# Patient Record
Sex: Male | Born: 1940 | Race: Black or African American | Hispanic: No | Smoking: Former smoker
Health system: Southern US, Community
[De-identification: ages and names within clinical notes are randomized; demographics above are authoritative.]

## PROBLEM LIST (undated history)

## (undated) DIAGNOSIS — M1990 Unspecified osteoarthritis, unspecified site: Secondary | ICD-10-CM

## (undated) DIAGNOSIS — H9319 Tinnitus, unspecified ear: Secondary | ICD-10-CM

## (undated) DIAGNOSIS — K279 Peptic ulcer, site unspecified, unspecified as acute or chronic, without hemorrhage or perforation: Secondary | ICD-10-CM

## (undated) DIAGNOSIS — M7730 Calcaneal spur, unspecified foot: Secondary | ICD-10-CM

## (undated) DIAGNOSIS — T4145XA Adverse effect of unspecified anesthetic, initial encounter: Secondary | ICD-10-CM

## (undated) DIAGNOSIS — K219 Gastro-esophageal reflux disease without esophagitis: Secondary | ICD-10-CM

## (undated) DIAGNOSIS — Z8042 Family history of malignant neoplasm of prostate: Secondary | ICD-10-CM

## (undated) DIAGNOSIS — M545 Low back pain, unspecified: Secondary | ICD-10-CM

## (undated) DIAGNOSIS — Z87440 Personal history of urinary (tract) infections: Secondary | ICD-10-CM

## (undated) DIAGNOSIS — N200 Calculus of kidney: Secondary | ICD-10-CM

## (undated) DIAGNOSIS — Z860100 Personal history of colon polyps, unspecified: Secondary | ICD-10-CM

## (undated) DIAGNOSIS — E669 Obesity, unspecified: Secondary | ICD-10-CM

## (undated) DIAGNOSIS — Z9889 Other specified postprocedural states: Secondary | ICD-10-CM

## (undated) DIAGNOSIS — D696 Thrombocytopenia, unspecified: Principal | ICD-10-CM

## (undated) DIAGNOSIS — E611 Iron deficiency: Principal | ICD-10-CM

## (undated) DIAGNOSIS — L03115 Cellulitis of right lower limb: Secondary | ICD-10-CM

## (undated) DIAGNOSIS — M359 Systemic involvement of connective tissue, unspecified: Secondary | ICD-10-CM

## (undated) DIAGNOSIS — G4730 Sleep apnea, unspecified: Secondary | ICD-10-CM

## (undated) DIAGNOSIS — R112 Nausea with vomiting, unspecified: Secondary | ICD-10-CM

## (undated) DIAGNOSIS — H409 Unspecified glaucoma: Secondary | ICD-10-CM

## (undated) DIAGNOSIS — Z800 Family history of malignant neoplasm of digestive organs: Secondary | ICD-10-CM

## (undated) DIAGNOSIS — G8929 Other chronic pain: Secondary | ICD-10-CM

## (undated) DIAGNOSIS — N400 Enlarged prostate without lower urinary tract symptoms: Secondary | ICD-10-CM

## (undated) DIAGNOSIS — R51 Headache: Secondary | ICD-10-CM

## (undated) DIAGNOSIS — R519 Headache, unspecified: Secondary | ICD-10-CM

## (undated) DIAGNOSIS — Z741 Need for assistance with personal care: Secondary | ICD-10-CM

## (undated) DIAGNOSIS — T8859XA Other complications of anesthesia, initial encounter: Secondary | ICD-10-CM

## (undated) DIAGNOSIS — L03116 Cellulitis of left lower limb: Secondary | ICD-10-CM

## (undated) DIAGNOSIS — E039 Hypothyroidism, unspecified: Secondary | ICD-10-CM

## (undated) DIAGNOSIS — I5032 Chronic diastolic (congestive) heart failure: Secondary | ICD-10-CM

## (undated) DIAGNOSIS — I872 Venous insufficiency (chronic) (peripheral): Secondary | ICD-10-CM

## (undated) DIAGNOSIS — E785 Hyperlipidemia, unspecified: Secondary | ICD-10-CM

## (undated) DIAGNOSIS — C259 Malignant neoplasm of pancreas, unspecified: Secondary | ICD-10-CM

## (undated) DIAGNOSIS — H269 Unspecified cataract: Secondary | ICD-10-CM

## (undated) DIAGNOSIS — I10 Essential (primary) hypertension: Secondary | ICD-10-CM

## (undated) DIAGNOSIS — R6510 Systemic inflammatory response syndrome (SIRS) of non-infectious origin without acute organ dysfunction: Secondary | ICD-10-CM

## (undated) HISTORY — DX: Gastro-esophageal reflux disease without esophagitis: K21.9

## (undated) HISTORY — PX: BACK SURGERY: SHX140

## (undated) HISTORY — DX: Hypothyroidism, unspecified: E03.9

## (undated) HISTORY — PX: KNEE ARTHROSCOPY: SUR90

## (undated) HISTORY — DX: Other specified postprocedural states: Z98.890

## (undated) HISTORY — DX: Peptic ulcer, site unspecified, unspecified as acute or chronic, without hemorrhage or perforation: K27.9

## (undated) HISTORY — DX: Obesity, unspecified: E66.9

## (undated) HISTORY — DX: Unspecified osteoarthritis, unspecified site: M19.90

## (undated) HISTORY — DX: Family history of malignant neoplasm of digestive organs: Z80.0

## (undated) HISTORY — DX: Thrombocytopenia, unspecified: D69.6

## (undated) HISTORY — DX: Family history of malignant neoplasm of prostate: Z80.42

## (undated) HISTORY — DX: Tinnitus, unspecified ear: H93.19

## (undated) HISTORY — DX: Hyperlipidemia, unspecified: E78.5

## (undated) HISTORY — DX: Personal history of urinary (tract) infections: Z87.440

## (undated) HISTORY — PX: TRANSURETHRAL RESECTION OF PROSTATE: SHX73

## (undated) HISTORY — DX: Malignant neoplasm of pancreas, unspecified: C25.9

## (undated) HISTORY — DX: Calcaneal spur, unspecified foot: M77.30

## (undated) HISTORY — PX: CATARACT EXTRACTION W/ INTRAOCULAR LENS IMPLANT: SHX1309

## (undated) HISTORY — PX: SHOULDER OPEN ROTATOR CUFF REPAIR: SHX2407

## (undated) HISTORY — DX: Venous insufficiency (chronic) (peripheral): I87.2

## (undated) HISTORY — DX: Iron deficiency: E61.1

## (undated) HISTORY — PX: LAPAROSCOPIC CHOLECYSTECTOMY: SUR755

---
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
## 1998-01-16 ENCOUNTER — Ambulatory Visit (HOSPITAL_COMMUNITY): Admission: RE | Admit: 1998-01-16 | Discharge: 1998-01-16 | Payer: Self-pay | Admitting: Orthopaedic Surgery

## 2001-02-05 ENCOUNTER — Ambulatory Visit (HOSPITAL_COMMUNITY): Admission: RE | Admit: 2001-02-05 | Discharge: 2001-02-05 | Payer: Self-pay | Admitting: Internal Medicine

## 2001-02-05 ENCOUNTER — Encounter: Payer: Self-pay | Admitting: Internal Medicine

## 2001-03-09 ENCOUNTER — Ambulatory Visit (HOSPITAL_COMMUNITY): Admission: RE | Admit: 2001-03-09 | Discharge: 2001-03-09 | Payer: Self-pay | Admitting: Internal Medicine

## 2001-06-07 ENCOUNTER — Ambulatory Visit (HOSPITAL_COMMUNITY): Admission: RE | Admit: 2001-06-07 | Discharge: 2001-06-07 | Payer: Self-pay | Admitting: Internal Medicine

## 2001-06-07 ENCOUNTER — Encounter: Payer: Self-pay | Admitting: Internal Medicine

## 2001-07-30 ENCOUNTER — Encounter: Payer: Self-pay | Admitting: Neurosurgery

## 2001-08-01 ENCOUNTER — Observation Stay (HOSPITAL_COMMUNITY): Admission: RE | Admit: 2001-08-01 | Discharge: 2001-08-02 | Payer: Self-pay | Admitting: Neurosurgery

## 2001-08-01 ENCOUNTER — Encounter: Payer: Self-pay | Admitting: Neurosurgery

## 2001-08-02 ENCOUNTER — Encounter: Payer: Self-pay | Admitting: Neurosurgery

## 2001-11-11 ENCOUNTER — Ambulatory Visit (HOSPITAL_COMMUNITY): Admission: RE | Admit: 2001-11-11 | Discharge: 2001-11-11 | Payer: Self-pay | Admitting: Neurosurgery

## 2001-11-11 ENCOUNTER — Encounter: Payer: Self-pay | Admitting: Neurosurgery

## 2001-12-04 ENCOUNTER — Encounter: Admission: RE | Admit: 2001-12-04 | Discharge: 2001-12-04 | Payer: Self-pay | Admitting: Neurosurgery

## 2001-12-04 ENCOUNTER — Encounter: Payer: Self-pay | Admitting: Neurosurgery

## 2001-12-18 ENCOUNTER — Encounter: Payer: Self-pay | Admitting: Neurosurgery

## 2001-12-18 ENCOUNTER — Encounter: Admission: RE | Admit: 2001-12-18 | Discharge: 2001-12-18 | Payer: Self-pay | Admitting: Neurosurgery

## 2002-01-01 ENCOUNTER — Encounter: Admission: RE | Admit: 2002-01-01 | Discharge: 2002-01-01 | Payer: Self-pay | Admitting: Neurosurgery

## 2002-01-01 ENCOUNTER — Encounter: Payer: Self-pay | Admitting: Neurosurgery

## 2004-07-29 ENCOUNTER — Encounter: Admission: RE | Admit: 2004-07-29 | Discharge: 2004-07-29 | Payer: Self-pay | Admitting: Neurosurgery

## 2005-07-21 ENCOUNTER — Ambulatory Visit (HOSPITAL_COMMUNITY): Admission: RE | Admit: 2005-07-21 | Discharge: 2005-07-21 | Payer: Self-pay

## 2006-05-09 ENCOUNTER — Ambulatory Visit: Payer: Self-pay | Admitting: Internal Medicine

## 2006-05-22 ENCOUNTER — Ambulatory Visit: Payer: Self-pay | Admitting: Internal Medicine

## 2006-05-22 ENCOUNTER — Ambulatory Visit (HOSPITAL_COMMUNITY): Admission: RE | Admit: 2006-05-22 | Discharge: 2006-05-22 | Payer: Self-pay | Admitting: Internal Medicine

## 2006-05-22 ENCOUNTER — Encounter (INDEPENDENT_AMBULATORY_CARE_PROVIDER_SITE_OTHER): Payer: Self-pay | Admitting: Specialist

## 2006-10-20 ENCOUNTER — Ambulatory Visit (HOSPITAL_COMMUNITY): Admission: RE | Admit: 2006-10-20 | Discharge: 2006-10-20 | Payer: Self-pay | Admitting: Neurosurgery

## 2006-12-22 HISTORY — PX: NM MYOCAR PERF WALL MOTION: HXRAD629

## 2007-02-16 ENCOUNTER — Ambulatory Visit: Payer: Self-pay | Admitting: Gastroenterology

## 2007-02-20 ENCOUNTER — Ambulatory Visit (HOSPITAL_COMMUNITY): Admission: RE | Admit: 2007-02-20 | Discharge: 2007-02-20 | Payer: Self-pay | Admitting: Gastroenterology

## 2007-02-21 ENCOUNTER — Ambulatory Visit (HOSPITAL_COMMUNITY): Admission: RE | Admit: 2007-02-21 | Discharge: 2007-02-21 | Payer: Self-pay | Admitting: *Deleted

## 2007-02-27 HISTORY — PX: CARDIAC CATHETERIZATION: SHX172

## 2007-02-28 ENCOUNTER — Encounter (HOSPITAL_COMMUNITY): Admission: RE | Admit: 2007-02-28 | Discharge: 2007-03-30 | Payer: Self-pay | Admitting: Internal Medicine

## 2007-05-25 ENCOUNTER — Ambulatory Visit: Payer: Self-pay | Admitting: Internal Medicine

## 2007-05-29 ENCOUNTER — Ambulatory Visit (HOSPITAL_COMMUNITY): Admission: RE | Admit: 2007-05-29 | Discharge: 2007-05-29 | Payer: Self-pay | Admitting: Internal Medicine

## 2007-06-06 ENCOUNTER — Encounter: Admission: RE | Admit: 2007-06-06 | Discharge: 2007-06-06 | Payer: Self-pay | Admitting: Internal Medicine

## 2007-06-12 ENCOUNTER — Ambulatory Visit: Payer: Self-pay | Admitting: Internal Medicine

## 2007-07-16 ENCOUNTER — Encounter (INDEPENDENT_AMBULATORY_CARE_PROVIDER_SITE_OTHER): Payer: Self-pay | Admitting: General Surgery

## 2007-07-16 ENCOUNTER — Ambulatory Visit (HOSPITAL_COMMUNITY): Admission: RE | Admit: 2007-07-16 | Discharge: 2007-07-16 | Payer: Self-pay | Admitting: General Surgery

## 2007-09-12 ENCOUNTER — Ambulatory Visit: Payer: Self-pay | Admitting: Internal Medicine

## 2007-10-26 ENCOUNTER — Ambulatory Visit: Payer: Self-pay | Admitting: Internal Medicine

## 2007-11-04 ENCOUNTER — Emergency Department (HOSPITAL_COMMUNITY): Admission: EM | Admit: 2007-11-04 | Discharge: 2007-11-04 | Payer: Self-pay | Admitting: Emergency Medicine

## 2007-11-08 ENCOUNTER — Ambulatory Visit: Payer: Self-pay | Admitting: Internal Medicine

## 2007-11-19 ENCOUNTER — Ambulatory Visit: Payer: Self-pay | Admitting: Gastroenterology

## 2007-11-20 ENCOUNTER — Ambulatory Visit (HOSPITAL_COMMUNITY): Admission: RE | Admit: 2007-11-20 | Discharge: 2007-11-20 | Payer: Self-pay | Admitting: Gastroenterology

## 2008-01-30 ENCOUNTER — Encounter: Admission: RE | Admit: 2008-01-30 | Discharge: 2008-01-30 | Payer: Self-pay | Admitting: Neurosurgery

## 2008-06-05 ENCOUNTER — Ambulatory Visit: Payer: Self-pay | Admitting: Internal Medicine

## 2008-07-07 ENCOUNTER — Ambulatory Visit (HOSPITAL_COMMUNITY): Admission: RE | Admit: 2008-07-07 | Discharge: 2008-07-07 | Payer: Self-pay | Admitting: Internal Medicine

## 2008-09-20 HISTORY — PX: TRANSTHORACIC ECHOCARDIOGRAM: SHX275

## 2008-10-01 ENCOUNTER — Inpatient Hospital Stay (HOSPITAL_COMMUNITY): Admission: EM | Admit: 2008-10-01 | Discharge: 2008-10-03 | Payer: Self-pay | Admitting: Emergency Medicine

## 2008-10-02 ENCOUNTER — Encounter (INDEPENDENT_AMBULATORY_CARE_PROVIDER_SITE_OTHER): Payer: Self-pay | Admitting: Family Medicine

## 2008-10-08 ENCOUNTER — Emergency Department (HOSPITAL_COMMUNITY): Admission: EM | Admit: 2008-10-08 | Discharge: 2008-10-08 | Payer: Self-pay | Admitting: Emergency Medicine

## 2008-10-13 ENCOUNTER — Ambulatory Visit (HOSPITAL_COMMUNITY): Admission: RE | Admit: 2008-10-13 | Discharge: 2008-10-13 | Payer: Self-pay | Admitting: Internal Medicine

## 2009-05-07 ENCOUNTER — Ambulatory Visit (HOSPITAL_COMMUNITY): Admission: RE | Admit: 2009-05-07 | Discharge: 2009-05-07 | Payer: Self-pay | Admitting: Cardiology

## 2009-05-11 ENCOUNTER — Ambulatory Visit (HOSPITAL_COMMUNITY): Admission: RE | Admit: 2009-05-11 | Discharge: 2009-05-11 | Payer: Self-pay | Admitting: Cardiology

## 2009-06-19 DIAGNOSIS — I10 Essential (primary) hypertension: Secondary | ICD-10-CM | POA: Insufficient documentation

## 2009-06-19 DIAGNOSIS — R143 Flatulence: Secondary | ICD-10-CM

## 2009-06-19 DIAGNOSIS — N259 Disorder resulting from impaired renal tubular function, unspecified: Secondary | ICD-10-CM | POA: Insufficient documentation

## 2009-06-19 DIAGNOSIS — R142 Eructation: Secondary | ICD-10-CM

## 2009-06-19 DIAGNOSIS — R141 Gas pain: Secondary | ICD-10-CM | POA: Insufficient documentation

## 2009-06-19 DIAGNOSIS — K219 Gastro-esophageal reflux disease without esophagitis: Secondary | ICD-10-CM | POA: Insufficient documentation

## 2009-06-19 DIAGNOSIS — K7689 Other specified diseases of liver: Secondary | ICD-10-CM | POA: Insufficient documentation

## 2009-06-19 DIAGNOSIS — K8020 Calculus of gallbladder without cholecystitis without obstruction: Secondary | ICD-10-CM | POA: Insufficient documentation

## 2009-06-19 DIAGNOSIS — R109 Unspecified abdominal pain: Secondary | ICD-10-CM | POA: Insufficient documentation

## 2009-07-16 ENCOUNTER — Ambulatory Visit: Payer: Self-pay | Admitting: Internal Medicine

## 2009-07-16 DIAGNOSIS — Z860100 Personal history of colon polyps, unspecified: Secondary | ICD-10-CM | POA: Insufficient documentation

## 2009-09-09 ENCOUNTER — Encounter (INDEPENDENT_AMBULATORY_CARE_PROVIDER_SITE_OTHER): Payer: Self-pay

## 2009-10-20 ENCOUNTER — Ambulatory Visit: Payer: Self-pay | Admitting: Internal Medicine

## 2010-01-05 ENCOUNTER — Encounter: Payer: Self-pay | Admitting: Internal Medicine

## 2010-01-06 ENCOUNTER — Inpatient Hospital Stay (HOSPITAL_COMMUNITY)
Admission: EM | Admit: 2010-01-06 | Discharge: 2010-01-12 | Payer: Self-pay | Source: Home / Self Care | Admitting: Emergency Medicine

## 2010-02-15 ENCOUNTER — Encounter (INDEPENDENT_AMBULATORY_CARE_PROVIDER_SITE_OTHER): Payer: Self-pay | Admitting: *Deleted

## 2010-02-16 ENCOUNTER — Telehealth (INDEPENDENT_AMBULATORY_CARE_PROVIDER_SITE_OTHER): Payer: Self-pay

## 2010-02-26 ENCOUNTER — Ambulatory Visit (HOSPITAL_COMMUNITY)
Admission: RE | Admit: 2010-02-26 | Discharge: 2010-02-26 | Payer: Self-pay | Source: Home / Self Care | Admitting: Internal Medicine

## 2010-05-18 ENCOUNTER — Encounter (INDEPENDENT_AMBULATORY_CARE_PROVIDER_SITE_OTHER): Payer: Self-pay

## 2010-05-19 ENCOUNTER — Ambulatory Visit (HOSPITAL_COMMUNITY)
Admission: RE | Admit: 2010-05-19 | Discharge: 2010-05-19 | Payer: Self-pay | Source: Home / Self Care | Attending: Internal Medicine | Admitting: Internal Medicine

## 2010-05-23 HISTORY — PX: LUMBAR DISC SURGERY: SHX700

## 2010-06-08 ENCOUNTER — Encounter: Payer: Self-pay | Admitting: Internal Medicine

## 2010-06-08 ENCOUNTER — Ambulatory Visit
Admission: RE | Admit: 2010-06-08 | Discharge: 2010-06-08 | Payer: Self-pay | Source: Home / Self Care | Attending: Gastroenterology | Admitting: Gastroenterology

## 2010-06-08 DIAGNOSIS — R197 Diarrhea, unspecified: Secondary | ICD-10-CM | POA: Insufficient documentation

## 2010-06-08 DIAGNOSIS — R110 Nausea: Secondary | ICD-10-CM | POA: Insufficient documentation

## 2010-06-13 ENCOUNTER — Encounter: Payer: Self-pay | Admitting: Internal Medicine

## 2010-06-13 ENCOUNTER — Encounter: Payer: Self-pay | Admitting: Neurosurgery

## 2010-06-14 ENCOUNTER — Encounter: Payer: Self-pay | Admitting: Gastroenterology

## 2010-06-23 ENCOUNTER — Other Ambulatory Visit: Payer: Self-pay | Admitting: Internal Medicine

## 2010-06-23 ENCOUNTER — Encounter: Payer: Medicare Other | Admitting: Internal Medicine

## 2010-06-23 ENCOUNTER — Ambulatory Visit (HOSPITAL_COMMUNITY)
Admission: RE | Admit: 2010-06-23 | Discharge: 2010-06-23 | Disposition: A | Discharge status: Home / Self Care | Payer: Medicare Other | Attending: Internal Medicine | Admitting: Internal Medicine

## 2010-06-23 DIAGNOSIS — D126 Benign neoplasm of colon, unspecified: Secondary | ICD-10-CM

## 2010-06-23 DIAGNOSIS — K5730 Diverticulosis of large intestine without perforation or abscess without bleeding: Secondary | ICD-10-CM

## 2010-06-23 DIAGNOSIS — Z860100 Personal history of colon polyps, unspecified: Secondary | ICD-10-CM | POA: Insufficient documentation

## 2010-06-23 DIAGNOSIS — I10 Essential (primary) hypertension: Secondary | ICD-10-CM | POA: Insufficient documentation

## 2010-06-23 DIAGNOSIS — Z79899 Other long term (current) drug therapy: Secondary | ICD-10-CM | POA: Insufficient documentation

## 2010-06-23 DIAGNOSIS — R197 Diarrhea, unspecified: Secondary | ICD-10-CM | POA: Insufficient documentation

## 2010-06-23 DIAGNOSIS — Z09 Encounter for follow-up examination after completed treatment for conditions other than malignant neoplasm: Secondary | ICD-10-CM

## 2010-06-23 DIAGNOSIS — E785 Hyperlipidemia, unspecified: Secondary | ICD-10-CM | POA: Insufficient documentation

## 2010-06-24 ENCOUNTER — Encounter: Payer: Self-pay | Admitting: Internal Medicine

## 2010-06-24 NOTE — Assessment & Plan Note (Addendum)
Summary: TO SET UP TCS/CM   Visit Type:  Follow-up Visit Primary Care Provider:  Sherwood [name]  CC:  Schedule TCS.  History of Present Illness: [name] [name] presents today for an H&P prior to a colonoscopy. He has a history of multiple colonic adenomas removed 2007. He has had to delay his colonoscopy secondary to wife and self medical problems. Reports intermittent nausea for the past few weeks, has run out of Prevacid and unable to get secondary to finances. Intermittent epigastric discomfort. Has had loose stool for the past 2-3 weeks. Had chole approximately one year ago, and has had somewhat loose stools since then, but he feels as if it has worsened over past few weeks. Having loose stool 2-3 days, not postprandial in nature. No rectal bleeding. Was in hospital 2 mos ago. Was on abx one month ago, can't remember name. No change in medications. No sick contacts. Diarrhea worsens with milk products.   Current Medications (verified): 1)  Zocor 40 Mg Tabs (Simvastatin) .... At Bedtime 2)  Metoprolol Tartrate 25 Mg Tabs (Metoprolol Tartrate) .... Take 1/2 Tablet Daily 3)  Tylenol 325 Mg Tabs (Acetaminophen) .... As Needed 4)  Prevacid 24hr 15 Mg Cpdr (Lansoprazole) .... Once Daily 5)  Levothyroxine Sodium 25 Mcg Tabs (Levothyroxine Sodium) .... Once Daily 6)  Travatan Z 0.004 % Soln (Travoprost) .... Once Daily 7)  Brimonidine Tartrate 0.2 % Soln (Brimonidine Tartrate) .... Once Daily 8)  Furosemide 80 Mg Tabs (Furosemide) .... Take 1 Tablet By Mouth Two Times A Day 9)  Flomax 0.4 Mg Caps (Tamsulosin Hcl) .... Take 1 Tablet By Mouth Once A Day  Allergies (verified): 1)  ! Aspirin 2)  ! * Nexium  Past History:  Family History: Last updated: 07/16/2009 Father, colon cancer, age 10s, died age 36 Mother, deceased age 30s, pancreatic cancer  Social History: Last updated: 07/16/2009 Married. Five children. Disabled. Remote tobacco use. No alcohol.   Past Medical History: Reviewed history from  07/16/2009 and no changes required. GERD Hypertension Sleep Apnea H. Pylori, 2002, s/p treatment Glaucoma EGD in October 2002, erosive esophagitis. Esophageal dilation performed given history of dysphagia, small hiatal hernia. Colonoscopy, December 2007, normal terminal ileum normal up to 10 cm, multiple 4-6 mm pedunculated polyps at the hepatic flexure and in the distal ascending colon. Largest polyp was 1 cm just distal to the ileocecal valve. Multiple polyps sent to pathology, all adenomatous. Left-sided diverticulosis EGD, December 2007 small hiatal hernia. Fatty liver with normal LFTs Nephrolithiasis Normal gastric emptying study, 2008 Hyperlipidemia  Past Surgical History: Reviewed history from 07/16/2009 and no changes required. CHOLECYSTECTOMY 2009 MULTIPLE KNEE,ARM AND SHOULDER SURGERIES RIGHT INGUINAL HERNIORRHAPHY    Review of Systems General:  Denies fever, chills, and anorexia. Eyes:  Denies blurring, irritation, and discharge. ENT:  Denies sore throat, hoarseness, and difficulty swallowing. CV:  Denies chest pains and syncope. Resp:  Denies dyspnea at rest and wheezing. GI:  Complains of nausea, abdominal pain, diarrhea, and change in bowel habits; denies bloody BM's and black BMs. GU:  Denies urinary burning and urinary frequency. MS:  Denies joint pain / LOM, joint swelling, and joint stiffness. Derm:  Denies rash, itching, and dry skin. Neuro:  Denies weakness and syncope. Psych:  Denies depression and anxiety. Endo:  Denies cold intolerance and heat intolerance.  Vital Signs:  Patient profile:   70 year old male Height:      68 inches Weight:      300.50 pounds BMI:     45.86 Temp:  98.6 degrees F oral Pulse rate:   76 / minute BP sitting:   120 / 80  (left arm) Cuff size:   large  Vitals Entered By: Cloria Spring LPN (June 08, 2010 1:27 PM)  Physical Exam  General:  Well developed, well nourished, no acute distress.obese.   Head:   Normocephalic and atraumatic. Eyes:  sclera without icterus, conjuctiva clear Mouth:  No deformity or lesions, dentition normal. Lungs:  Clear throughout to auscultation. Heart:  Regular rate and rhythm; no murmurs, rubs,  or bruits. Abdomen:  obese, large AP diameter, +BS, no rebound or guarding, no definitive HSM, difficult to appreciate secondary to body habitus. rectus diastasis Msk:  Symmetrical with no gross deformities. Normal posture. Pulses:  Normal pulses noted. Extremities:  No clubbing, cyanosis, edema or deformities noted. Neurologic:  Alert and  oriented x4;  grossly normal neurologically. Skin:  Intact without significant lesions or rashes. Psych:  Alert and cooperative. Normal mood and affect.  Impression & Recommendations:  Problem # 1:  COLONIC POLYPS, ADENOMATOUS, HX OF (ICD-V27.44) 70 year old male due for surveillance colonoscopy. Last in 2007 with multiple colonic adenomas. Denies any rectal bleeding. Denies abdominal pain. Does report worsening of loose stools over past few weeks, but he has had loose stools since cholecystectomy approximately one year ago. Was on abx one month ago. Doubt Cdiff, but will check Cdiff PCR to ensure. May likely be a component of bile-salt diarrhea. Not necessarily post-prandial. Also reports sensitivity to dairy products, so may have lactose intolerance conributing.    Cdiff PCR TCS with RMR: the risks, benefits, alternatives have been discussed in detail; verbal consent obtained Avoid lactose products  Problem # 2:  NAUSEA (ICD-787.02) onset of nausea X few weeks, correlates with cessation of Prevacid, which he was unable to fill secondary to finances. Will resume Prevacid 15 mg daily. If nausea persists, will then pursue further work-up. Epigastric discomfort also correlates with cessation of prevacid.  Prevacid 15 mg by mouth daily, samples given Rx called to pharmacy of choice Contact us if epigastric discomfort/nausea continues  despite restarting PPI  Problem # 3:  DIARRHEA (ICD-787.91) See #1 Orders: T-C diff by PCR ([id number]) Prescriptions: PREVACID 15 MG CPDR (LANSOPRAZOLE) take 1 by mouth daily  #31 x 5   Entered and Authorized by:   Gerrit Halls NP   Signed by:   Gerrit Halls NP on 06/08/2010   Method used:   Faxed to ...       [facility] (retail)       [address]       [place], [place]  [address]       Ph: [phone]       Fax: [phone]   RxID:   [id number]   Appended Document: Orders Update    Clinical Lists Changes  Orders: Added new Service order of Est. Patient Level II ([address]) - Signed

## 2010-06-24 NOTE — Letter (Signed)
Summary: TCS ORDERS  TCS ORDERS   Imported By: Rexene Alberts 06/08/2010 14:47:35  _____________________________________________________________________  External Attachment:    Type:   Image     Comment:   External Document

## 2010-06-24 NOTE — Progress Notes (Signed)
Summary: Pt can't do colonoscopy now... other medical problems  Phone Note Call from Patient   Caller: Patient Summary of Call: Pt called and said he is not able to get his TCS now that he is having alot of problems, including heart, and kidney and  has to have a hernia removed. He will call when he is able to do. Initial call taken by: Cloria Spring LPN,  February 16, 2010 11:07 AM

## 2010-06-24 NOTE — Letter (Signed)
Summary: Recall, Screening Colonoscopy Only  [facility]  [address]   [place], [place] [address]   Phone: [phone]  Fax: [phone]    February 15, 2010  [place] [address] [place], [place]  [address] 03/12/1941   Dear Mr. Richardine Service,   Our records indicate it is time to schedule your colonoscopy.    Please call our office at [phone] and ask for the nurse.   Thank you,    Hendricks Limes, LPN Cloria Spring, LPN  [facility] Ph: [phone]   Fax: [phone]

## 2010-06-24 NOTE — Letter (Signed)
Summary: Internal Other Domingo Dimes  Internal Other Domingo Dimes   Imported By: Cloria Spring LPN 81/19/1478 29:56:21  _____________________________________________________________________  External Attachment:    Type:   Image     Comment:   External Document

## 2010-06-24 NOTE — Assessment & Plan Note (Signed)
Summary: FU OV 1 YR,HX COLON POLYP,DIARRHEA,GERD/AMS   Visit Type:  f/u Primary Care Provider:  Fusco  Chief Complaint:  follow up gerd and hx of polpys.  History of Present Illness: [name] is a pleasant 70 year old gentleman who presents for one-year followup. He has a history of chronic gastroesophageal reflux disease, colonic polyps due for surveillance colonoscopy this year, intermittent diarrhea, upper abdominal pain and bloating. He was last seen in January 2010. Overall, he states he has been doing well. He has noted some indigestion and feels pressure in the epigastrium at times. When he belches he gets relief. He takes Prevacid 24-hour one p.r.n. but not regularly. He uses samples whenever he can get them.  He admits to eating late at night but tries to stick to soft diet.  He denies any difficulty swallowing. Denies any significant constipation or diarrhea. No blood in the stool or melena. I mentioned that he is due for a three-year surveillance colonoscopy given family history of colon cancer and personal h/o multiple adenomatous colonic polyps seen in 2007. At this time he is not ready to schedule colonoscopy and tells me he'll call back in the next couple months.  Current Medications (verified): 1)  Zocor 40 Mg Tabs (Simvastatin) .... At Bedtime 2)  Furosemide 40 Mg Tabs (Furosemide) .... Three Times A Day As Needed 3)  Metoprolol Tartrate 25 Mg Tabs (Metoprolol Tartrate) .... Take 1/2 Tablet Twice A Day 4)  Tylenol 325 Mg Tabs (Acetaminophen) .... As Needed 5)  Micardis 40 Mg Tabs (Telmisartan) .... 2 Once Daily 6)  Aleve 220 Mg Tabs (Naproxen Sodium) .... As Needed 7)  Prevacid 24hr 15 Mg Cpdr (Lansoprazole) .... Once Daily 8)  Spironolactone 25 Mg Tabs (Spironolactone) .... Once Daily 9)  Levothyroxine Sodium 25 Mcg Tabs (Levothyroxine Sodium) .... Once Daily 10)  Travatan Z 0.004 % Soln (Travoprost) .... Once Daily 11)  Brimonidine Tartrate 0.2 % Soln (Brimonidine  Tartrate) .... Once Daily 12)  Combigan 0.2-0.5 % Soln (Brimonidine Tartrate-Timolol) .... Once Daily  Allergies (verified): 1)  ! Aspirin 2)  ! * Nexium  Past History:  Past Medical History: GERD Hypertension Sleep Apnea H. Pylori, 2002, s/p treatment Glaucoma EGD in October 2002, erosive esophagitis. Esophageal dilation performed given history of dysphagia, small hiatal hernia. Colonoscopy, December 2007, normal terminal ileum normal up to 10 cm, multiple 4-6 mm pedunculated polyps at the hepatic flexure and in the distal ascending colon. Largest polyp was 1 cm just distal to the ileocecal valve. Multiple polyps sent to pathology, all adenomatous. Left-sided diverticulosis EGD, December 2007 small hiatal hernia. Fatty liver with normal LFTs Nephrolithiasis Normal gastric emptying study, 2008 Hyperlipidemia  Past Surgical History: CHOLECYSTECTOMY 2009 MULTIPLE KNEE,ARM AND SHOULDER SURGERIES RIGHT INGUINAL HERNIORRHAPHY    Family History: Father, colon cancer, age 4s, died age 55 Mother, deceased age 13s, pancreatic cancer  Social History: Married. Five children. Disabled. Remote tobacco use. No alcohol.   Review of Systems General:  Denies fever, chills, fatigue, weakness, and weight loss. CV:  Complains of peripheral edema; denies chest pains, angina, palpitations, and dyspnea on exertion. Resp:  Denies dyspnea at rest, dyspnea with exercise, and cough. GI:  See HPI. GU:  Denies urinary burning and blood in urine.  Vital Signs:  Patient profile:   70 year old male Height:      68 inches Weight:      298 pounds BMI:     45.47 Temp:     97.9 degrees F oral Pulse  rate:   68 / minute BP sitting:   118 / 80  (left arm) Cuff size:   large  Vitals Entered By: Hendricks Limes LPN (July 16, 2009 11:11 AM)  Physical Exam  General:  Well developed, well nourished, no acute distress.obese.   Head:  Normocephalic and atraumatic. Eyes:  Sclera nonicteric. Mouth:   OP moist. Abdomen:  normal bowel sounds, obese, no hernia, no tenderness, no masses, and no hepatomegally or splenomegaly.  No abd bruit.  Extremities:  Bilateral 1+ pedal edema.   Neurologic:  Alert and  oriented x4;  grossly normal neurologically. Skin:  Intact without significant lesions or rashes. Psych:  Alert and cooperative. Normal mood and affect.  Impression & Recommendations:  Problem # 1:  GASTROESOPHAGEAL REFLUX DISEASE, CHRONIC (ICD-530.81)  Breakthrough symptoms. Needs to take PPI daily. Prevacid 24hr well-tolerated, so will provide with samples to take one daily before breakfast. Five months samples provided. OV in one year or sooner if needed.  Orders: Est. Patient Level III ([address])  Problem # 2:  COLONIC POLYPS, ADENOMATOUS, HX OF (ICD-V12.72)  Due for three yr surveillance at this time. Patient to call when ready to schedule. We will send reminder letter in two months if needed.   Orders: Est. Patient Level III (91478)

## 2010-06-24 NOTE — Letter (Signed)
Summary: Recall, Screening Colonoscopy Only  [facility]  [address]   [place], [place] [address]   Phone: [phone]  Fax: [phone]    May 18, 2010  [place] [address] [place], [place]  [address] Oct 02, 1940   Dear Mr. Richardine Service,   Our records indicate it is time to schedule your colonoscopy.    Please call our office at [phone] and ask for the nurse.   Thank you,  Hendricks Limes, LPN Cloria Spring, LPN  [facility] Ph: [phone]   Fax: [phone]

## 2010-06-24 NOTE — Assessment & Plan Note (Signed)
Summary: CONSULT FOR TCS/SS   Visit Type:  Initial Visit Primary Care Provider:  fusco  Chief Complaint:  ?tcs and no problems with stomach right now.  History of Present Illness: History of multiple colonic adenomas removed 2007. GERD symptoms well-controlled ON Prevacid. He is now somewhat overdue for three-year followup examination. His wife remains quite ill in [place]. He still needs to put off colonoscopy until she gets straightened out. He's not had any lower GI tract symptoms. Reflux symptoms well-controlled on Prevacid.  Current Problems (verified): 1)  Neoplasm, Malignant, Colon, Family Hx  (ICD-V16.0) 2)  Colonic Polyps, Adenomatous, Hx of  (ICD-V12.72) 3)  Renal Insufficiency  (ICD-588.9) 4)  Hypertension  (ICD-401.9) 5)  Fatty Liver Disease  (ICD-571.8) 6)  Cholelithiasis, Symptomatic  (ICD-574.20) 7)  Gastroesophageal Reflux Disease, Chronic  (ICD-530.81) 8)  Abdominal Pain  (ICD-789.00) 9)  Abdominal Bloating  (ICD-787.3)  Current Medications (verified): 1)  Zocor 40 Mg Tabs (Simvastatin) .... At Bedtime 2)  Furosemide 40 Mg Tabs (Furosemide) .... Three Times A Day As Needed 3)  Metoprolol Tartrate 25 Mg Tabs (Metoprolol Tartrate) .... Take 1/2 Tablet Twice A Day 4)  Tylenol 325 Mg Tabs (Acetaminophen) .... As Needed 5)  Micardis 40 Mg Tabs (Telmisartan) .... 2 Once Daily 6)  Aleve 220 Mg Tabs (Naproxen Sodium) .... As Needed 7)  Prevacid 24hr 15 Mg Cpdr (Lansoprazole) .... Once Daily 8)  Spironolactone 25 Mg Tabs (Spironolactone) .... Once Daily 9)  Levothyroxine Sodium 25 Mcg Tabs (Levothyroxine Sodium) .... Once Daily 10)  Travatan Z 0.004 % Soln (Travoprost) .... Once Daily 11)  Brimonidine Tartrate 0.2 % Soln (Brimonidine Tartrate) .... Once Daily 12)  Combigan 0.2-0.5 % Soln (Brimonidine Tartrate-Timolol) .... Once Daily  Allergies (verified): 1)  ! Aspirin 2)  ! * Nexium  Past History:  Past Medical History: Last updated:  07/16/2009 GERD Hypertension Sleep Apnea H. Pylori, 2002, s/p treatment Glaucoma EGD in October 2002, erosive esophagitis. Esophageal dilation performed given history of dysphagia, small hiatal hernia. Colonoscopy, December 2007, normal terminal ileum normal up to 10 cm, multiple 4-6 mm pedunculated polyps at the hepatic flexure and in the distal ascending colon. Largest polyp was 1 cm just distal to the ileocecal valve. Multiple polyps sent to pathology, all adenomatous. Left-sided diverticulosis EGD, December 2007 small hiatal hernia. Fatty liver with normal LFTs Nephrolithiasis Normal gastric emptying study, 2008 Hyperlipidemia  Past Surgical History: Last updated: 07/16/2009 CHOLECYSTECTOMY 2009 MULTIPLE KNEE,ARM AND SHOULDER SURGERIES RIGHT INGUINAL HERNIORRHAPHY    Family History: Last updated: 07/16/2009 Father, colon cancer, age 43s, died age 52 Mother, deceased age 48s, pancreatic cancer  Social History: Last updated: 07/16/2009 Married. Five children. Disabled. Remote tobacco use. No alcohol.   Vital Signs:  Patient profile:   70 year old male Height:      68 inches Weight:      305 pounds BMI:     46.54 Temp:     97.3 degrees F oral Pulse rate:   76 / minute BP sitting:   124 / 88  (left arm) Cuff size:   large  Vitals Entered By: Hendricks Limes LPN (Oct 20, 2009 8:50 AM)  Physical Exam  General:  alert conversant and pleasant gentleman resting comfortably Lungs:  clear to auscultation Heart:  regular rate rhythm without murmur gallop or Abdomen:  obese. Positive bowel sounds, soft nontender without appreciable mass or obvious organomegaly  Impression & Recommendations: Impression: History of multiple colonic adenomas removed 2007. He needs a colonoscopy now. However,  he has to put off because all this time is now being spent taking care of his ailing wife. I told him that we'll hold up on scheduling now; call him in 3 months to attempt to set up a   colonoscopy.  GERD well-controlled on Prevacid. Continue that medication daily.   Appended Document: Orders Update    Clinical Lists Changes  Orders: Added new Service order of Est. Patient Level III ([address]) - Signed

## 2010-06-24 NOTE — Letter (Signed)
Summary: Recall Colonoscopy/Endoscopy, Change to Office Visit  [facility]  [address]   [place], [place] [address]   Phone: [phone]  Fax: [phone]      September 09, 2009   [place] [address] [place], [place]  [address] June 15, 1940   Dear Mr. BEAMER,   According to our records, it is time for you to schedule a Colonoscopy/Endoscopy. However, after reviewing your medical record, we recommend an office visit in order to determine your need for a repeat procedure.  Please call [phone] at your convenience to schedule an office visit. If you have any questions or concerns, please feel free to contact our office.   Sincerely,   Cloria Spring LPN  [facility] Ph: [phone]   Fax: [phone]

## 2010-06-26 NOTE — Op Note (Signed)
  NAME:  [name] [name], [name] [name]             ACCOUNT NO.:  [id number]  MEDICAL RECORD NO.:  [id number]           PATIENT TYPE:  O  LOCATION:  DAY                           FACILITY:  [facility]  PHYSICIAN:  R. Roetta Sessions, M.D. DATE OF BIRTH:  [birth year]  DATE OF PROCEDURE:  06/23/2010 DATE OF DISCHARGE:                              OPERATIVE REPORT   PROCEDURE:  Colonoscopy with snare polypectomy.  INDICATIONS FOR PROCEDURE:  The patient is a 70 year old gentleman with history of colonic adenomas removed in 2007.  He is here for surveillance.  He had a recent diarrheal illness, somewhat better, was exposed to antibiotics recently.  C. diff PCR came back negative.  He has had some worsening reflux symptoms when he was completely out of Prevacid, now back on 15 mg orally daily with significant improvement, but not complete resolution of his symptoms.  Colonoscopy is now being done.  Risks, benefits, limitations, alternatives, imponderables have been discussed, questions answered.  Please see the documentation in the medical record.  PROCEDURE NOTE:  O2 saturation, blood pressure, pulse, respirations were monitored throughout the entire procedure.  CONSCIOUS SEDATION:  Versed 5 mg IV, Demerol 50 mg IV in divided doses.  INSTRUMENT:  Pentax video chip system.  FINDINGS:  Digital rectal exam revealed no abnormalities.  Endoscopic findings:  The prep was poor and there was significant colonic effluent, it was greasy and clouded the scope lens, could not be rectified during the procedure, it would not wash off.  Scope was advanced easily to the cecum.  Cecum, ileocecal valve, appendiceal orifice were well seen and photographed for the record.  From this level, scope was slowly and cautiously withdrawn.  All previously mentioned mucosal surfaces were again seen.  There was a 5-mm polyp at the splenic flexure which was cold snared, recovered.  There was scattered left-sided diverticula.  No other  gross colonic mucosal abnormalities were seen.  However, the greasy effluent compromised the image as this material was adherent to the scope lens.  Scope was pulled down to the rectum where examination of rectal mucosa including retroflexed view of the anal verge demonstrated no abnormalities.  The patient tolerated the procedure well.  Cecal withdrawal time 11 minutes.  IMPRESSION: 1. Normal rectum. 2. Left-sided diverticula. 3. Polyp at the splenic flexure status post snare polypectomy.     Remainder of colonic mucosa appeared grossly normal.  Poor prep     compromised exam.  RECOMMENDATIONS: 1. Diverticulosis, polyp literature provided to [name]. 2. Follow up on path. 3. Increase Prevacid to 30 mg orally daily.  Prescription given. 4. Further recommendations to follow.     [name], M.D.     RMR/MEDQ  D:  06/23/2010  T:  06/23/2010  Job:  [id number]  cc:   Madelin Rear. Sherwood Gambler, MD Fax: [phone]  Electronically Signed by Lorrin Goodell M.D. on 06/24/2010 09:31:41 AM

## 2010-06-30 NOTE — Letter (Addendum)
Summary: Patient Notice, Colon Biopsy Results  [facility]  [address]   [place], [place] [address]   Phone: [phone]  Fax: [phone]       June 24, 2010   [place] [address] [place], [place]  [address] 1940-10-15    Dear [name] [name],  I am pleased to inform you that the biopsies taken during your recent colonoscopy did not show any evidence of cancer upon pathologic examination.  Additional information/recommendations:  No further action is needed at this time.  Please follow-up with your primary care physician for your other healthcare needs.  You should have a repeat colonoscopy examination  in 5years.  Please call us if you are having persistent problems or have questions about your condition that have not been fully answered at this time.  Sincerely,    R. Roetta Sessions MD, FACP [facility] Ph: [phone]    Fax: [phone]   Appended Document: Patient Notice, Colon Biopsy Results letter mailed to pt  Appended Document: Patient Notice, Colon Biopsy Results reminder in computer

## 2010-07-12 ENCOUNTER — Emergency Department (HOSPITAL_COMMUNITY): Payer: Medicare Other

## 2010-07-12 ENCOUNTER — Encounter (HOSPITAL_COMMUNITY): Payer: Self-pay | Admitting: Radiology

## 2010-07-12 ENCOUNTER — Emergency Department (HOSPITAL_COMMUNITY)
Admission: EM | Admit: 2010-07-12 | Discharge: 2010-07-12 | Disposition: A | Discharge status: Home / Self Care | Payer: Medicare Other | Attending: Emergency Medicine | Admitting: Emergency Medicine

## 2010-07-12 DIAGNOSIS — L02419 Cutaneous abscess of limb, unspecified: Secondary | ICD-10-CM | POA: Insufficient documentation

## 2010-07-12 DIAGNOSIS — E7800 Pure hypercholesterolemia, unspecified: Secondary | ICD-10-CM | POA: Insufficient documentation

## 2010-07-12 DIAGNOSIS — R609 Edema, unspecified: Secondary | ICD-10-CM | POA: Insufficient documentation

## 2010-07-12 DIAGNOSIS — M7989 Other specified soft tissue disorders: Secondary | ICD-10-CM | POA: Insufficient documentation

## 2010-07-12 DIAGNOSIS — I10 Essential (primary) hypertension: Secondary | ICD-10-CM | POA: Insufficient documentation

## 2010-07-12 DIAGNOSIS — Z79899 Other long term (current) drug therapy: Secondary | ICD-10-CM | POA: Insufficient documentation

## 2010-07-12 DIAGNOSIS — L03119 Cellulitis of unspecified part of limb: Secondary | ICD-10-CM | POA: Insufficient documentation

## 2010-07-12 DIAGNOSIS — R079 Chest pain, unspecified: Secondary | ICD-10-CM | POA: Insufficient documentation

## 2010-07-12 DIAGNOSIS — R0602 Shortness of breath: Secondary | ICD-10-CM | POA: Insufficient documentation

## 2010-07-12 DIAGNOSIS — E119 Type 2 diabetes mellitus without complications: Secondary | ICD-10-CM | POA: Insufficient documentation

## 2010-07-12 HISTORY — DX: Essential (primary) hypertension: I10

## 2010-07-12 LAB — DIFFERENTIAL
Eosinophils Absolute: 0.1 10*3/uL (ref 0.0–0.7)
Eosinophils Relative: 1 % (ref 0–5)
Lymphocytes Relative: 12 % (ref 12–46)
Lymphs Abs: 1.3 10*3/uL (ref 0.7–4.0)
Monocytes Relative: 7 % (ref 3–12)

## 2010-07-12 LAB — CBC
HCT: 42.7 % (ref 39.0–52.0)
MCH: 27 pg (ref 26.0–34.0)
MCV: 82.9 fL (ref 78.0–100.0)
Platelets: 135 10*3/uL — ABNORMAL LOW (ref 150–400)
RBC: 5.15 MIL/uL (ref 4.22–5.81)
RDW: 16.2 % — ABNORMAL HIGH (ref 11.5–15.5)
WBC: 10.8 10*3/uL — ABNORMAL HIGH (ref 4.0–10.5)

## 2010-07-12 LAB — BASIC METABOLIC PANEL
BUN: 10 mg/dL (ref 6–23)
Chloride: 103 mEq/L (ref 96–112)
Glucose, Bld: 83 mg/dL (ref 70–99)
Potassium: 4.1 mEq/L (ref 3.5–5.1)
Sodium: 140 mEq/L (ref 135–145)

## 2010-07-14 ENCOUNTER — Ambulatory Visit (HOSPITAL_COMMUNITY): Payer: Medicare Other

## 2010-07-23 ENCOUNTER — Inpatient Hospital Stay (HOSPITAL_COMMUNITY)
Admission: EM | Admit: 2010-07-23 | Discharge: 2010-07-29 | DRG: 394 | Disposition: A | Discharge status: Home Health | Payer: Medicare Other | Attending: Internal Medicine | Admitting: Internal Medicine

## 2010-07-23 ENCOUNTER — Emergency Department (HOSPITAL_COMMUNITY): Payer: Medicare Other

## 2010-07-23 DIAGNOSIS — R1013 Epigastric pain: Secondary | ICD-10-CM

## 2010-07-23 DIAGNOSIS — D696 Thrombocytopenia, unspecified: Secondary | ICD-10-CM | POA: Diagnosis present

## 2010-07-23 DIAGNOSIS — E039 Hypothyroidism, unspecified: Secondary | ICD-10-CM | POA: Diagnosis present

## 2010-07-23 DIAGNOSIS — Z8719 Personal history of other diseases of the digestive system: Secondary | ICD-10-CM

## 2010-07-23 DIAGNOSIS — L02419 Cutaneous abscess of limb, unspecified: Secondary | ICD-10-CM | POA: Diagnosis present

## 2010-07-23 DIAGNOSIS — Z86010 Personal history of colonic polyps: Secondary | ICD-10-CM

## 2010-07-23 DIAGNOSIS — I5032 Chronic diastolic (congestive) heart failure: Secondary | ICD-10-CM | POA: Diagnosis present

## 2010-07-23 DIAGNOSIS — D126 Benign neoplasm of colon, unspecified: Secondary | ICD-10-CM | POA: Diagnosis present

## 2010-07-23 DIAGNOSIS — I509 Heart failure, unspecified: Secondary | ICD-10-CM | POA: Diagnosis present

## 2010-07-23 DIAGNOSIS — K625 Hemorrhage of anus and rectum: Secondary | ICD-10-CM

## 2010-07-23 DIAGNOSIS — I10 Essential (primary) hypertension: Secondary | ICD-10-CM | POA: Diagnosis present

## 2010-07-23 DIAGNOSIS — R109 Unspecified abdominal pain: Secondary | ICD-10-CM

## 2010-07-23 DIAGNOSIS — K559 Vascular disorder of intestine, unspecified: Principal | ICD-10-CM | POA: Diagnosis present

## 2010-07-23 DIAGNOSIS — N400 Enlarged prostate without lower urinary tract symptoms: Secondary | ICD-10-CM | POA: Diagnosis present

## 2010-07-23 LAB — CBC
HCT: 43.4 % (ref 39.0–52.0)
Hemoglobin: 13.9 g/dL (ref 13.0–17.0)
Hemoglobin: 13.6 g/dL (ref 13.0–17.0)
MCH: 26.5 pg (ref 26.0–34.0)
MCH: 26.8 pg (ref 26.0–34.0)
MCHC: 32 g/dL (ref 30.0–36.0)
MCHC: 32.5 g/dL (ref 30.0–36.0)
Platelets: 109 10*3/uL — ABNORMAL LOW (ref 150–400)
RDW: 16.5 % — ABNORMAL HIGH (ref 11.5–15.5)
RDW: 16.4 % — ABNORMAL HIGH (ref 11.5–15.5)

## 2010-07-23 LAB — DIFFERENTIAL
Basophils Absolute: 0 10*3/uL (ref 0.0–0.1)
Eosinophils Relative: 1 % (ref 0–5)
Lymphocytes Relative: 8 % — ABNORMAL LOW (ref 12–46)
Monocytes Absolute: 0.9 10*3/uL (ref 0.1–1.0)
Monocytes Relative: 8 % (ref 3–12)
Neutro Abs: 8.3 10*3/uL — ABNORMAL HIGH (ref 1.7–7.7)

## 2010-07-23 LAB — COMPREHENSIVE METABOLIC PANEL
ALT: 17 U/L (ref 0–53)
Alkaline Phosphatase: 59 U/L (ref 39–117)
BUN: 11 mg/dL (ref 6–23)
CO2: 33 mEq/L — ABNORMAL HIGH (ref 19–32)
Chloride: 103 mEq/L (ref 96–112)
GFR calc non Af Amer: >60 mL/min (ref >60)
Glucose, Bld: 94 mg/dL (ref 70–99)
Potassium: 4 mEq/L (ref 3.5–5.1)
Sodium: 143 mEq/L (ref 135–145)
Total Bilirubin: 0.8 mg/dL (ref 0.3–1.2)

## 2010-07-23 LAB — LACTIC ACID, PLASMA: Lactic Acid, Venous: 2 mmol/L (ref 0.5–2.2)

## 2010-07-23 LAB — MRSA PCR SCREENING: MRSA by PCR: NEGATIVE

## 2010-07-24 ENCOUNTER — Inpatient Hospital Stay (HOSPITAL_COMMUNITY): Payer: Medicare Other

## 2010-07-24 LAB — BASIC METABOLIC PANEL
BUN: 9 mg/dL (ref 6–23)
CO2: 30 mEq/L (ref 19–32)
Calcium: 8.8 mg/dL (ref 8.4–10.5)
Chloride: 105 mEq/L (ref 96–112)
Creatinine, Ser: 0.8 mg/dL (ref 0.4–1.5)
Glucose, Bld: 111 mg/dL — ABNORMAL HIGH (ref 70–99)

## 2010-07-24 LAB — CBC
HCT: 42.3 % (ref 39.0–52.0)
Hemoglobin: 13.5 g/dL (ref 13.0–17.0)
MCH: 26.4 pg (ref 26.0–34.0)
MCHC: 31.9 g/dL (ref 30.0–36.0)
MCV: 82.8 fL (ref 78.0–100.0)

## 2010-07-24 MED ORDER — IOHEXOL 300 MG/ML  SOLN: 125.0000 mL | Freq: Once | INTRAMUSCULAR | Status: AC | PRN

## 2010-07-25 LAB — BASIC METABOLIC PANEL
BUN: 5 mg/dL — ABNORMAL LOW (ref 6–23)
CO2: 32 mEq/L (ref 19–32)
Calcium: 9 mg/dL (ref 8.4–10.5)
Chloride: 104 mEq/L (ref 96–112)
Creatinine, Ser: 1 mg/dL (ref 0.4–1.5)
GFR calc Af Amer: >60 mL/min (ref >60)
GFR calc non Af Amer: >60 mL/min (ref >60)
Glucose, Bld: 114 mg/dL — ABNORMAL HIGH (ref 70–99)
Potassium: 4.1 mEq/L (ref 3.5–5.1)
Sodium: 142 mEq/L (ref 135–145)

## 2010-07-25 LAB — CBC
HCT: 42.5 % (ref 39.0–52.0)
Hemoglobin: 13.6 g/dL (ref 13.0–17.0)
MCHC: 32 g/dL (ref 30.0–36.0)
MCV: 83.3 fL (ref 78.0–100.0)
RDW: 16.4 % — ABNORMAL HIGH (ref 11.5–15.5)

## 2010-07-26 ENCOUNTER — Other Ambulatory Visit: Payer: Self-pay | Admitting: Internal Medicine

## 2010-07-26 DIAGNOSIS — K5730 Diverticulosis of large intestine without perforation or abscess without bleeding: Secondary | ICD-10-CM

## 2010-07-26 DIAGNOSIS — K921 Melena: Secondary | ICD-10-CM

## 2010-07-26 DIAGNOSIS — R109 Unspecified abdominal pain: Secondary | ICD-10-CM

## 2010-07-26 DIAGNOSIS — D131 Benign neoplasm of stomach: Secondary | ICD-10-CM

## 2010-07-26 DIAGNOSIS — R112 Nausea with vomiting, unspecified: Secondary | ICD-10-CM

## 2010-07-26 DIAGNOSIS — D126 Benign neoplasm of colon, unspecified: Secondary | ICD-10-CM

## 2010-07-26 DIAGNOSIS — R197 Diarrhea, unspecified: Secondary | ICD-10-CM

## 2010-07-26 LAB — CBC
HCT: 40.5 % (ref 39.0–52.0)
MCHC: 31.9 g/dL (ref 30.0–36.0)
MCV: 83.5 fL (ref 78.0–100.0)
Platelets: 119 10*3/uL — ABNORMAL LOW (ref 150–400)
RDW: 16.4 % — ABNORMAL HIGH (ref 11.5–15.5)
WBC: 8 10*3/uL (ref 4.0–10.5)

## 2010-07-26 LAB — BASIC METABOLIC PANEL
BUN: 4 mg/dL — ABNORMAL LOW (ref 6–23)
Chloride: 100 mEq/L (ref 96–112)
Creatinine, Ser: 0.93 mg/dL (ref 0.4–1.5)
Glucose, Bld: 100 mg/dL — ABNORMAL HIGH (ref 70–99)
Potassium: 4 mEq/L (ref 3.5–5.1)

## 2010-07-27 DIAGNOSIS — D649 Anemia, unspecified: Secondary | ICD-10-CM

## 2010-07-27 DIAGNOSIS — D126 Benign neoplasm of colon, unspecified: Secondary | ICD-10-CM

## 2010-07-27 DIAGNOSIS — K922 Gastrointestinal hemorrhage, unspecified: Secondary | ICD-10-CM

## 2010-07-27 LAB — CBC
MCH: 26.7 pg (ref 26.0–34.0)
MCHC: 31.8 g/dL (ref 30.0–36.0)
MCV: 83.8 fL (ref 78.0–100.0)
Platelets: 118 10*3/uL — ABNORMAL LOW (ref 150–400)
RDW: 16.4 % — ABNORMAL HIGH (ref 11.5–15.5)

## 2010-07-27 LAB — BASIC METABOLIC PANEL
BUN: 4 mg/dL — ABNORMAL LOW (ref 6–23)
Calcium: 8.5 mg/dL (ref 8.4–10.5)
Chloride: 99 mEq/L (ref 96–112)
Creatinine, Ser: 0.95 mg/dL (ref 0.4–1.5)
GFR calc Af Amer: >60 mL/min (ref >60)
GFR calc non Af Amer: >60 mL/min (ref >60)

## 2010-07-27 LAB — DIFFERENTIAL
Basophils Relative: 0 % (ref 0–1)
Eosinophils Absolute: 0.1 10*3/uL (ref 0.0–0.7)
Eosinophils Relative: 2 % (ref 0–5)
Lymphs Abs: 1 10*3/uL (ref 0.7–4.0)
Monocytes Absolute: 0.6 10*3/uL (ref 0.1–1.0)
Monocytes Relative: 9 % (ref 3–12)
Neutrophils Relative %: 76 % (ref 43–77)

## 2010-07-27 LAB — CROSSMATCH
ABO/RH(D): O POS
Unit division: 0

## 2010-07-28 DIAGNOSIS — K922 Gastrointestinal hemorrhage, unspecified: Secondary | ICD-10-CM

## 2010-07-28 LAB — DIFFERENTIAL
Basophils Absolute: 0 10*3/uL (ref 0.0–0.1)
Basophils Relative: 1 % (ref 0–1)
Monocytes Absolute: 0.8 10*3/uL (ref 0.1–1.0)
Neutro Abs: 6.1 10*3/uL (ref 1.7–7.7)

## 2010-07-28 LAB — CBC
Hemoglobin: 13.3 g/dL (ref 13.0–17.0)
MCHC: 32 g/dL (ref 30.0–36.0)
RDW: 16.3 % — ABNORMAL HIGH (ref 11.5–15.5)
WBC: 8.2 10*3/uL (ref 4.0–10.5)

## 2010-07-28 LAB — BASIC METABOLIC PANEL
CO2: 34 mEq/L — ABNORMAL HIGH (ref 19–32)
Calcium: 9.1 mg/dL (ref 8.4–10.5)
Creatinine, Ser: 1.01 mg/dL (ref 0.4–1.5)
GFR calc Af Amer: >60 mL/min (ref >60)
GFR calc non Af Amer: >60 mL/min (ref >60)
Sodium: 141 mEq/L (ref 135–145)

## 2010-07-28 NOTE — Op Note (Addendum)
NAME:  [name] [name], [name] [name]             ACCOUNT NO.:  [id number]  MEDICAL RECORD NO.:  [id number]           PATIENT TYPE:  I  LOCATION:  A316                          FACILITY:  [facility]  PHYSICIAN:  R. Roetta Sessions, M.D. DATE OF BIRTH:  1940-05-26  DATE OF PROCEDURE:  07/26/2010 DATE OF DISCHARGE:                              OPERATIVE REPORT   PROCEDURE:  Diagnostic EGD followed by ileal colonoscopy with snare polypectomy.  HEMOSTASIS:  Clipping.  INDICATIONS FOR PROCEDURE:  A 70 year old gentleman in hospital with crampy lower abdominal pain followed by diarrhea, followed by hematochezia, some nausea and vomiting as well, and reported some black stools.  Hemoglobin has remained stable at 12.9 this morning, (which is down somewhat from 13.6 yesterday).  The patient remained hemodynamically stable.  EGD and sigmoidoscopy is now being done to further evaluate his symptoms.  Risks, benefits, limitations, alternatives, and prognosis have been discussed and questions answered. Please see documentation medical record.  PROCEDURE NOTE:  O2 saturation, blood pressure, pulse, and respirations were monitored the entirety of both procedures.  Conscious sedation with Versed 3 mg IV and Demerol 50 mg IV in divided doses.  INSTRUMENT:  Pentax video chip system with Cetacaine spray for topical pharyngeal anesthesia.  FINDINGS:  EGD examination showed tubular esophagus revealed no mucosal abnormalities.  Videoscope was traversed.  Stomach:  Gas cavity was emptied and insufflated well with air.  On examination of gastric mucosa included retroflexed and proximal stomach with esophagogastric junction demonstrated a couple of tiny antral erosions, a couple of tiny polyps, and a small hiatal hernia only.  Remainder of gastric mucosa appeared normal.  There was no erosion, ulcer, or infiltrating process were seen. Pylorus was patent, easily traversed, and duodenal bulb with second portion revealed no  abnormalities.  Therapeutic/diagnostic maneuvers were performed none.  Patient tolerated the procedure well as planned. He was prepped for sigmoidoscopy.  Digital rectal exam revealed no abnormalities.  The prep was suboptimal, but doable (enema prep). Colon:  Colonic mucosa was surveyed from the rectosigmoid junction on into the proximal left colon normal to the transverse colon.  The patient was noted to have frequent left-sided diverticula, and there was a 5-mm polyp in between 2 folds at junction of descending sigmoid, but no colitis or other abnormality was observed.  I easily advanced scope through the transverse colon and onto the cecum.  There was some blood- tinged colonic effluent at the cecum/ileocecal valve.  These structures were well seen, photographed for record.  After that, I intubated terminal ileum 10 cm and no fresh blood and clot coming out of the terminal ileum.  No mucosal abnormalities were seen.  However, scope was pulled back all previous mentioned mucosal surfaces were once again seen.  There was no colitis.  There was only left-sided diverticula. There was another 5 mm polyp at the junction of descending and sigmoid, which was hot snared and wet snared and we had some trouble with the cautery.  There was some post polypectomy bleeding, treated with two resolution clips.  Remainder colonic mucosa appeared normal.  Scope was pulled down and examined of the rectal mucosa  including retroflex view of the anal verge demonstrated only a small tiny anal papilla.  The patient tolerated the tolerated the colonoscopy very well.  Cecal withdrawal time 30 minutes.  IMPRESSION: 1. EGD:  Normal esophagus, small hiatal hernia, tiny gastric polyps     not manipulated, tiny antral erosions of doubtful clinical     significance, patent pylorus, normal duodenum. 2. Colonoscopy Findings:  Tiny anal papilla, otherwise normal rectum.     Left-sided diverticula.  Small polyp at  junction with descending     and sigmoid, status post hot snare removal with polypectomy, site     clipping.  Fresh blood in the right colon.  Fresh blood and clot     seen coming out of the terminal ileum.  I suspect small amount of bleeding, etiology of which is not well- defined at this time.  RECOMMENDATIONS: 1. We will move towards a capsule study of small bowel once we can get     the informed consent process covered. 2. We gave him 2 L of GoLYTELY later today in preparation for that     study and we will also keep him on clear liquid diet. 3. Followup on path from polyp. 4. No MRI until the clips are known to have passed.     [name], M.D.     RMR/MEDQ  D:  07/26/2010  T:  07/26/2010  Job:  [id number]  Electronically Signed by Lorrin Goodell M.D. on 07/27/2010 02:44:03 PM Electronically Signed by Lorrin Goodell M.D. on 07/27/2010 03:00:43 PM Electronically Signed by Lorrin Goodell M.D. on 07/27/2010 03:25:40 PM Electronically Signed by Lorrin Goodell M.D. on 07/27/2010 03:50:53 PM Electronically Signed by Lorrin Goodell M.D. on 07/27/2010 03:58:48 PM Electronically Signed by Lorrin Goodell M.D. on 07/27/2010 04:29:49 PM Electronically Signed by Lorrin Goodell M.D. on 07/27/2010 05:01:10 PM Electronically Signed by Lorrin Goodell M.D. on 07/27/2010 05:23:59 PM Electronically Signed by Lorrin Goodell M.D. on 07/27/2010 05:23:59 PM Electronically Signed by Lorrin Goodell M.D. on 07/27/2010 05:27:09 PM Electronically Signed by Lorrin Goodell M.D. on 07/27/2010 05:27:09 PM Electronically Signed by Lorrin Goodell M.D. on 07/27/2010 06:09:05 PM Electronically Signed by Lorrin Goodell M.D. on 07/27/2010 06:18:13 PM Electronically Signed by Lorrin Goodell M.D. on 07/27/2010 07:43:20 PM

## 2010-07-29 LAB — COMPREHENSIVE METABOLIC PANEL
ALT: 13 U/L (ref 0–53)
AST: 13 U/L (ref 0–37)
Alkaline Phosphatase: 51 U/L (ref 39–117)
CO2: 33 mEq/L — ABNORMAL HIGH (ref 19–32)
Chloride: 101 mEq/L (ref 96–112)
GFR calc Af Amer: >60 mL/min (ref >60)
GFR calc non Af Amer: >60 mL/min (ref >60)
Potassium: 4.2 mEq/L (ref 3.5–5.1)
Sodium: 140 mEq/L (ref 135–145)
Total Bilirubin: 0.9 mg/dL (ref 0.3–1.2)

## 2010-07-29 LAB — DIFFERENTIAL
Basophils Absolute: 0 10*3/uL (ref 0.0–0.1)
Basophils Relative: 0 % (ref 0–1)
Lymphocytes Relative: 14 % (ref 12–46)
Monocytes Relative: 9 % (ref 3–12)
Neutro Abs: 6.7 10*3/uL (ref 1.7–7.7)
Neutrophils Relative %: 75 % (ref 43–77)

## 2010-07-29 LAB — CBC
Hemoglobin: 13.6 g/dL (ref 13.0–17.0)
RBC: 5.07 MIL/uL (ref 4.22–5.81)

## 2010-07-30 NOTE — Discharge Summary (Signed)
NAME:  [name] [name], [name] [name]             ACCOUNT NO.:  [id number]  MEDICAL RECORD NO.:  [id number]           PATIENT TYPE:  I  LOCATION:  A316                          FACILITY:  [facility]  PHYSICIAN:  Wilson Singer, M.D.DATE OF BIRTH:  1940/06/09  DATE OF ADMISSION:  07/23/2010 DATE OF DISCHARGE:  03/08/2012LH                              DISCHARGE SUMMARY   FINAL DISCHARGE DIAGNOSES: 1. Rectal bleeding secondary to transient small-bowel ischemia. 2. Right leg cellulitis, improving. 3. History of diastolic congestive heart failure compensated. 4. Hypertension.  CONDITION ON DISCHARGE:  Stable.  MEDICATIONS ON DISCHARGE:  Bactrim DS 1 tablet b.i.d. to continue course and finish on August 03, 2010.  Continue home medications which include: 1. Furosemide 40 mg b.i.d. p.r.n. 2. Levothyroxine 25 mcg daily. 3. Metoprolol 12.5 mg every morning. 4. Flomax 0.4 mg daily. 5. Tylenol [id number] mg every 6 h. p.r.n. 6. Simvastatin 40 mg nightly.  CONDITION ON DISCHARGE:  Stable.  HISTORY:  This pleasant 70 year old man was admitted with rectal bleeding.  Please see initial history and physical examination done by Dr. Sunnie Nielsen.  HOSPITAL PROGRESS:  The patient was admitted and observed.  The patient never required blood transfusion and never became hypotensive.  He was seen by gastroenterologist who initially did an EGD and also colonoscopy.  He had a normal esophagus, small hiatal hernia and tiny gastric polyps.  In the colonoscopy, he had tiny anal papilla and a small polyp at the junction with the descending and sigmoid colon which resulted in removal with polypectomy.  He also underwent small bowel capsule study.  This showed that the GI bleeding was likely secondary to enteritis secondary to transit focal ischemia in the distal small bowel. The patient remained stable throughout his hospital stay with complaining of some left-sided abdominal pain 5/10 with occasional bouts of rectal  bleeding.  However, his hemoglobin really did not drop significantly to require blood transfusions.  Today, he feels well, has not opened his bowels this morning yet, describes abdominal pain 5/10, but is able to tolerate.  There is no nausea or vomiting.  PHYSICAL EXAMINATION:  VITAL SIGNS:  Temperature 98.1, blood pressure 110/70, pulse 78, saturation 96% on room air. ABDOMEN:  Soft and not particularly tender. LUNGS:  Lung fields are clear.  INVESTIGATIONS:  Today showed a sodium of 140, potassium 4.2, bicarbonate 33, BUN 6, creatinine 1.15, albumin reduced at 2.9, hemoglobin extremely stable of 13.6, white blood cell count 8.9, platelets 130.  DISPOSITION:  He is stable to be discharged home.  He was noted to have a right leg cellulitis which is also improving and he will finish the course of Bactrim DS for this.  He initially had been on intravenous antibiotics for this during the hospitalization.  He needs to follow up with Dr. Jena Gauss his gastroenterologist in the next one or two weeks and with his primary care for disposition Dr. Sherwood Gambler in the next 3-4 weeks.     Wilson Singer, M.D.     NCG/MEDQ  D:  07/29/2010  T:  07/30/2010  Job:  [id number]  cc:   Madelin Rear. Sherwood Gambler, MD Fax: [phone]  [name], M.D. [address] [place] [address]  Electronically Signed by Lilly Cove M.D. on 07/30/2010 12:40:06 PM

## 2010-08-02 NOTE — Op Note (Signed)
  NAME:  [name], [name]             ACCOUNT NO.:  [id number]  MEDICAL RECORD NO.:  [id number]           PATIENT TYPE:  I  LOCATION:  A316                          FACILITY:  [facility]  PHYSICIAN:  R. Roetta Sessions, M.D. DATE OF BIRTH:  02-16-1941  DATE OF PROCEDURE: DATE OF DISCHARGE:                              OPERATIVE REPORT   REQUESTING PHYSICIAN:  R. Roetta Sessions, M.D.  PRIMARY CARE PHYSICIAN:  Madelin Rear. Fusco, MD.  PROCEDURE:  Small bowel given capsule study.  INDICATIONS FOR PROCEDURE:  [name] [name] is a 70 year old male with history of GI bleeding.  He had a EGD and ileocolonoscopy by Dr. Jena Gauss on July 26, 2010.  He was found to have fresh blood and clot emanating out at the terminal ileum.  He was also found to have left-sided diverticula, a small polyp at the junction of the descending and sigmoid colon which was removed and a clip was placed.  He also had a normal esophagus, tiny gastric polyps, antral erosions.  He is therefore undergoing small bowel given capsule study to determine etiology of small bowel bleeding.  FINDINGS:  First gastric image was a 45-second first duodenal image at 21 minutes and 28 seconds.  Gastric passage time was 20 minutes.  Small- bowel passage time was 2 hours 50 minutes.  In his distal small bowel, he had a few polypoid lesions, suspect lymphoid aggregates beginning at 2 hours 44 minutes and 28 seconds.  In the distal small bowel, there was also prominent vasculature which appeared to be a transient focal ischemia.  Ileocecal valve was noted at 3 hours 12 minutes and 21 seconds and first cecal image at 3 hours 12 minutes 24 seconds.  IMPRESSION:  Gastrointestinal bleed suspect secondary to enteritis, suspect secondary to transient focal ischemia in the distal small bowel.  There was no evidence of mass.  There are few polypoid lymphoid aggregates. She had a benign finding in the distal small bowel.  PLAN:  We will continue to monitor  H and H.  We suspect this was a transient process and no further GI workup at this point unless his hemoglobin remains unstable or he has gross GI bleeding.     Lorenza Burton, N.P.   ______________________________ R. Roetta Sessions, M.D.    KJ/MEDQ  D:  07/27/2010  T:  07/27/2010  Job:  [id number]  cc:   Madelin Rear. Sherwood Gambler, MD Fax: [phone]  Electronically Signed by Lorenza Burton N.P. on 07/29/2010 09:48:17 AM Electronically Signed by Lorrin Goodell M.D. on 08/02/2010 10:28:23 AM

## 2010-08-03 NOTE — H&P (Signed)
NAME:  [name] [name], [name] [name]             ACCOUNT NO.:  [id number]  MEDICAL RECORD NO.:  [id number]           PATIENT TYPE:  E  LOCATION:  APED                          FACILITY:  [facility]  PHYSICIAN:  Hartley Barefoot, MD    DATE OF BIRTH:  02-May-1941  DATE OF ADMISSION:  07/23/2010 DATE OF DISCHARGE:  LH                             HISTORY & PHYSICAL   CHIEF COMPLAINT:  Blood in the stool, abdominal pain.  HISTORY OF PRESENT ILLNESS:  This is a very pleasant 70 year old African- American who was transferred from his primary care physician's office due to sudden onset of abdominal pain and bright red blood in the stool. The patient went to follow up with his primary care physician of his right lower extremity cellulitis.  When he starts feeling little sick in his primary care physician's office, then he needs to use the bathroom and he has started to have abdominal pain and blood in the stool, bright red.  He related that the day prior to admission he had three bowel movements, no diarrhea, but soft stool.  He relates he is still complaining of abdominal pain primarily epigastric radiating to bilateral lower quadrant.  Pain is 7/10 associated with nausea.  No vomiting.  No fevers.  He denies any ibuprofen or aspirin use.  He has been taking antibiotics for the last 2 or 3 weeks for his right lower extremity cellulitis.  He was diagnosed with cellulitis 3 weeks ago.  He presented to the emergency department.  He was started on Keflex.  He had a Doppler ultrasound and that was negative for DVT.  The day of admission he went to follow up with his primary care physician for further evaluation of his right lower extremity cellulitis.  He relates that the swelling and erythema of his right lower extremity is somewhat improved, but still with pain and with erythema.  He relates that he had  bright red blood mixed with stool, then turned black dark stool.  ALLERGIES:  HE HAS INTOLERANCE TO ASPIRIN,  GI BLEED.  PAST MEDICAL HISTORY: 1. History of pyelonephritis. 2. History of nephrolithiasis. 3. Diastolic heart failure, ejection fraction 60-65% in 2-D echo, May     2010. 4. Hyperlipidemia. 5. A prior history of diarrhea. 6. Fatty liver. 7. BPH. 8. Hypertension. 9. History of inguinal hernia.  PAST MEDICAL AND SURGICAL HISTORY: 1. Cholecystectomy. 2. Lithotripsy. 3. Right knee surgery. 4. Rotator cuff repair of his right shoulder. 5. Stent placed of his kidney. 6. History of polypectomy, recent colonoscopy, June 23, 2010.  MEDICATIONS: 1. Metoprolol 25 mg half tablet in the morning. 2. Tamsulosin 0.4 mg p.o. at bedtime. 3. Furosemide 80 mg b.i.d. 4. Levothyroxine 25 mcg p.o. daily. 5. Prevacid 15 mg p.o. daily. 6. Tylenol.  SOCIAL HISTORY:  He is married.  He has 5 children.  Denies alcohol or recreational drugs or smoking.  REVIEW OF SYSTEMS:  Negative except as per HPI.  PHYSICAL EXAMINATION:  VITALS:  Blood pressure 114/71 up to 130/70, temperature 98.2, respirations 22, sat 96 on room air, pulse 84. GENERAL:  The patient lying in bed, in no acute distress.  HEENT:  Head atraumatic, normocephalic.  Eyes, anicteric.  Pupils equally reactive to light.  Extraocular muscle intact. NECK:  Supple.  No JVD.  No carotid bruits. CARDIOVASCULAR:  S1 and S2, regular rhythm and rate.  No rubs, murmurs, or gallops. LUNGS:  Bilateral air movement, sporadic bronchi.  No crackles or wheezing. ABDOMEN:  Obese.  No rigidity.  No guarding or tenderness in the epigastric area and bilateral lower quadrant. EXTREMITY:  Pulse present right lower extremity with erythema and warmth to palpation and edema.  Left lower extremity with mild erythema. NEURO EXAM:  Alert and oriented x3.  Cranial nerves II through XII intact.  Sensation grossly intact.  Motor strength nonfocal.  LABORATORY DATA:  Admission labs:  White blood cell 10.1, hemoglobin 13.9, hematocrit 43, platelet 120, ANC  8.3.  Sodium 143, potassium 4.0, chloride 103, bicarb 33, BUN 11, creatinine 0.93, glucose 94, AST 18, ALT 17, BNP less than 30, INR 0.97.  X-ray, radiographic studies chest x- ray is stable, increased markings in both lung bases are seen consistent with minimal fibrotic changes or atelectasis.  No pulmonary edema, pneumonia, or pleural effusion.  ASSESSMENT AND PLAN: 1. Hematochezia, abdominal pain, differential peptic ulcer disease,     gastritis versus ischemic bowel, unlikely diverticular bleed.  The     patient complaining of abdominal pain, also colitis, infectious,     although unlikely no fever or leukocytosis, but the patient was     recently on antibiotics C.diff is possibility.  Will cycled     serial CBC q.8 h, two large for IV.  The patient received IV fluid     in the emergency department.  At this time, IV normal saline lock     due to history of diastolic heart failure.  We will monitor blood     pressure closely.  If hypotension, will start IV fluids.  We type     and screen 2 units of packed red blood cell and hold.  We will hold     blood pressure medications.  I will check lactic acid and C. diff, PCR.  I will consider CT of abdomen and pelvis.  I will consult GI     for possible upper endoscopy.  The patient had a recent     colonoscopy.  He had a polypectomy, June 23, 2010, unlikely this     is the postpolypectomy bleed, the colonoscopy was a month ago.  The     colonoscopy results only showed some diverticula and polyp. 2. Diastolic heart failure appears to be compensated, although the     patient is having some mild shortness of breath.  BNP less than 30.     I will hold Lasix and metoprolol at this time in the setting of     acute GI bleed.  We will monitor closely. 3. Right lower extremity cellulitis.  He has some mild improvement on     oral antibiotics.  I will start IV vancomycin and will consider     transition to oral antibiotics over the course of  days. 4. Thrombocytopenia.  We will monitor at this time, platelet at 120.     This could be secondary to infection versus medications. 5. For DVT prophylaxis, no heparin or Lovenox due to GI bleed.  We     will avoid SCDs due to lower extremity cellulitis.     Hartley Barefoot, MD     BR/MEDQ  D:  07/23/2010  T:  07/23/2010  Job:  [id number]  Electronically Signed by Hartley Barefoot MD on 08/03/2010 09:56:26 AM

## 2010-08-06 LAB — DIFFERENTIAL
Basophils Absolute: 0 10*3/uL (ref 0.0–0.1)
Basophils Absolute: 0 10*3/uL (ref 0.0–0.1)
Basophils Absolute: 0.4 10*3/uL — ABNORMAL HIGH (ref 0.0–0.1)
Basophils Relative: 0 % (ref 0–1)
Basophils Relative: 0 % (ref 0–1)
Basophils Relative: 0 % (ref 0–1)
Basophils Relative: 2 % — ABNORMAL HIGH (ref 0–1)
Eosinophils Absolute: 0 10*3/uL (ref 0.0–0.7)
Eosinophils Absolute: 0.1 10*3/uL (ref 0.0–0.7)
Eosinophils Absolute: 0.2 10*3/uL (ref 0.0–0.7)
Eosinophils Relative: 0 % (ref 0–5)
Eosinophils Relative: 1 % (ref 0–5)
Eosinophils Relative: 2 % (ref 0–5)
Eosinophils Relative: 2 % (ref 0–5)
Lymphocytes Relative: 8 % — ABNORMAL LOW (ref 12–46)
Lymphocytes Relative: 9 % — ABNORMAL LOW (ref 12–46)
Lymphs Abs: 0.8 10*3/uL (ref 0.7–4.0)
Monocytes Absolute: 1.1 10*3/uL — ABNORMAL HIGH (ref 0.1–1.0)
Monocytes Absolute: 1.2 10*3/uL — ABNORMAL HIGH (ref 0.1–1.0)
Monocytes Absolute: 1.5 10*3/uL — ABNORMAL HIGH (ref 0.1–1.0)
Monocytes Relative: 14 % — ABNORMAL HIGH (ref 3–12)
Monocytes Relative: 8 % (ref 3–12)
Neutro Abs: 5.5 10*3/uL (ref 1.7–7.7)
Neutro Abs: 6.5 10*3/uL (ref 1.7–7.7)
Neutrophils Relative %: 75 % (ref 43–77)

## 2010-08-06 LAB — BASIC METABOLIC PANEL
BUN: 10 mg/dL (ref 6–23)
BUN: 11 mg/dL (ref 6–23)
BUN: 11 mg/dL (ref 6–23)
CO2: 33 mEq/L — ABNORMAL HIGH (ref 19–32)
CO2: 38 mEq/L — ABNORMAL HIGH (ref 19–32)
Calcium: 8.4 mg/dL (ref 8.4–10.5)
Calcium: 8.9 mg/dL (ref 8.4–10.5)
Chloride: 100 mEq/L (ref 96–112)
Chloride: 92 mEq/L — ABNORMAL LOW (ref 96–112)
Chloride: 96 mEq/L (ref 96–112)
Creatinine, Ser: 0.96 mg/dL (ref 0.4–1.5)
Creatinine, Ser: 1.09 mg/dL (ref 0.4–1.5)
Creatinine, Ser: 1.28 mg/dL (ref 0.4–1.5)
GFR calc Af Amer: >60 mL/min (ref >60)
GFR calc Af Amer: >60 mL/min (ref >60)
GFR calc non Af Amer: >60 mL/min (ref >60)
GFR calc non Af Amer: >60 mL/min (ref >60)
Glucose, Bld: 119 mg/dL — ABNORMAL HIGH (ref 70–99)
Glucose, Bld: 120 mg/dL — ABNORMAL HIGH (ref 70–99)
Glucose, Bld: 145 mg/dL — ABNORMAL HIGH (ref 70–99)
Potassium: 3.5 mEq/L (ref 3.5–5.1)
Potassium: 3.7 mEq/L (ref 3.5–5.1)
Potassium: 4.4 mEq/L (ref 3.5–5.1)
Sodium: 130 mEq/L — ABNORMAL LOW (ref 135–145)
Sodium: 137 mEq/L (ref 135–145)

## 2010-08-06 LAB — OVA AND PARASITE EXAMINATION

## 2010-08-06 LAB — CULTURE, BLOOD (ROUTINE X 2)
Culture: NO GROWTH
Report Status: 8222011

## 2010-08-06 LAB — CBC
HCT: 37.4 % — ABNORMAL LOW (ref 39.0–52.0)
HCT: 37.8 % — ABNORMAL LOW (ref 39.0–52.0)
HCT: 39.8 % (ref 39.0–52.0)
Hemoglobin: 12.5 g/dL — ABNORMAL LOW (ref 13.0–17.0)
MCH: 27.1 pg (ref 26.0–34.0)
MCHC: 33 g/dL (ref 30.0–36.0)
MCHC: 33.2 g/dL (ref 30.0–36.0)
MCHC: 33.4 g/dL (ref 30.0–36.0)
MCV: 82.2 fL (ref 78.0–100.0)
MCV: 82.3 fL (ref 78.0–100.0)
Platelets: 121 10*3/uL — ABNORMAL LOW (ref 150–400)
Platelets: 124 10*3/uL — ABNORMAL LOW (ref 150–400)
RBC: 4.6 MIL/uL (ref 4.22–5.81)
RDW: 16.8 % — ABNORMAL HIGH (ref 11.5–15.5)
RDW: 16.8 % — ABNORMAL HIGH (ref 11.5–15.5)
RDW: 16.8 % — ABNORMAL HIGH (ref 11.5–15.5)
RDW: 17.1 % — ABNORMAL HIGH (ref 11.5–15.5)
WBC: 14.6 10*3/uL — ABNORMAL HIGH (ref 4.0–10.5)
WBC: 7.1 10*3/uL (ref 4.0–10.5)
WBC: 8.7 10*3/uL (ref 4.0–10.5)

## 2010-08-06 LAB — STOOL CULTURE

## 2010-08-06 LAB — CLOSTRIDIUM DIFFICILE EIA

## 2010-08-06 LAB — URINALYSIS, ROUTINE W REFLEX MICROSCOPIC
Bilirubin Urine: NEGATIVE
Ketones, ur: NEGATIVE mg/dL
Nitrite: POSITIVE — AB
Protein, ur: NEGATIVE mg/dL
Urobilinogen, UA: 0.2 mg/dL (ref 0.0–1.0)
pH: 5.5 (ref 5.0–8.0)

## 2010-08-06 LAB — COMPREHENSIVE METABOLIC PANEL
ALT: 15 U/L (ref 0–53)
AST: 17 U/L (ref 0–37)
Albumin: 3 g/dL — ABNORMAL LOW (ref 3.5–5.2)
Alkaline Phosphatase: 58 U/L (ref 39–117)
Glucose, Bld: 125 mg/dL — ABNORMAL HIGH (ref 70–99)
Potassium: 3.9 mEq/L (ref 3.5–5.1)
Sodium: 137 mEq/L (ref 135–145)
Total Protein: 6 g/dL (ref 6.0–8.3)

## 2010-08-06 LAB — URINE CULTURE: Colony Count: >=100000

## 2010-08-06 LAB — CARDIAC PANEL(CRET KIN+CKTOT+MB+TROPI)
CK, MB: 0.5 ng/mL (ref 0.3–4.0)
CK, MB: 1.8 ng/mL (ref 0.3–4.0)
Relative Index: INVALID (ref 0.0–2.5)
Troponin I: 0.01 ng/mL (ref 0.00–0.06)
Troponin I: 0.02 ng/mL (ref 0.00–0.06)

## 2010-08-06 LAB — BRAIN NATRIURETIC PEPTIDE: Pro B Natriuretic peptide (BNP): <5 pg/mL (ref 0.0–100.0)

## 2010-08-06 LAB — LACTIC ACID, PLASMA: Lactic Acid, Venous: 1.8 mmol/L (ref 0.5–2.2)

## 2010-08-11 ENCOUNTER — Encounter: Payer: Self-pay | Admitting: Gastroenterology

## 2010-08-11 ENCOUNTER — Ambulatory Visit (INDEPENDENT_AMBULATORY_CARE_PROVIDER_SITE_OTHER): Payer: Medicare Other | Admitting: Gastroenterology

## 2010-08-11 VITALS — BP 138/80 | HR 80 | Temp 98.7°F | Ht 69.0 in | Wt 303.0 lb

## 2010-08-11 DIAGNOSIS — K625 Hemorrhage of anus and rectum: Secondary | ICD-10-CM

## 2010-08-11 NOTE — Progress Notes (Signed)
Subjective:    Patient ID: [name] [name], male    DOB: [birth year], 70 y.o.   MRN: [id number]  HPI  Pt returns today in f/u from hospital; he was admitted early March for abdominal pain and rectal bleeding. He actually underwent two colonoscopys this year; first in February, which was a poor prep. The second was done during admission in March with left-sided diverticula and an adenoma. EGD showed antral erosions. Givens study was performed to evaluate source of bleed: it was thought to be related to transient focal ischemia. He returns today without any evidence of rectal bleeding or abdominal pain. C/o gas with anything except yogurt. Feels his stomach gurgling.No constipation. Only complaint is of joint discomfort. Taking prevacid 15 mg daily. Denies reflux. Rare nausea. No dysphagia or odynophagia. Avoids dairy.   Past Medical History  Diagnosis Date  . CHF (congestive heart failure)   . Hypertension   . Obesity   . Hyperlipidemia   . PUD (peptic ulcer disease)   . GERD (gastroesophageal reflux disease)   . S/P endoscopy 2007, 2012    2007: nl, May 2012: antral erosions  . Osteoarthritis   . S/P colonoscopy 2007, Feb and May 2012    hx of adenomas, left-sided diverticula,     Past Surgical History  Procedure Date  . Knee arthroscopy   . Rotator cuff repair   . Cholecystectomy   . Lumbar spine surgery     Family History  Problem Relation Age of Onset  . Colon cancer Father 59    deceased  . Prostate cancer Father   . Pancreatic cancer Mother 56    deceased    History  Substance Use Topics  . Smoking status: Former Smoker    Types: Cigars    Quit date: 08/10/1973  . Smokeless tobacco: Never Used  . Alcohol Use: No    Allergies  Allergen Reactions  . Aspirin     REACTION: unknown reaction    Current Outpatient Prescriptions  Medication Sig Dispense Refill  . diclofenac (VOLTAREN) 0.1 % ophthalmic solution 1 drop 4 (four) times daily.        . diclofenac  sodium (VOLTAREN) 1 % GEL Apply 1 application topically 4 (four) times daily.        . dorzolamide (TRUSOPT) 2 % ophthalmic solution Place 1 drop into both eyes 3 (three) times daily.        . furosemide (LASIX) 80 MG tablet Take 80 mg by mouth 2 (two) times daily.        . lansoprazole (PREVACID) 15 MG capsule Take 15 mg by mouth daily.        Marland Kitchen levothyroxine (SYNTHROID, LEVOTHROID) 25 MCG tablet Take 25 mcg by mouth daily.        . metoprolol succinate (TOPROL-XL) 25 MG 24 hr tablet Take 25 mg by mouth daily.        [name] (SYSTANE) 0.4-0.3 % SOLN Apply 1 drop to eye daily.        . simvastatin (ZOCOR) 40 MG tablet Take 40 mg by mouth at bedtime.        . Tamsulosin HCl (FLOMAX) 0.4 MG CAPS Take 0.4 mg by mouth daily after supper.        . travoprost, benzalkonium, (TRAVATAN) 0.004 % ophthalmic solution Place 1 drop into both eyes at bedtime.           BP 138/80  Pulse 80  Temp(Src) 98.7 F (37.1 C) (Oral)  Ht 5\' 9"  (1.753 m)  Wt 303 lb (137.44 kg)  BMI 44.75 kg/m2   Review of Systems  Constitutional: Negative.   HENT: Negative.   Eyes: Negative.   Respiratory: Negative.   Cardiovascular: Negative.   Gastrointestinal:       See HPI.  Genitourinary: Negative.   Musculoskeletal:       See HPI.   Skin: Negative.   Neurological: Negative.   Hematological: Negative.   Psychiatric/Behavioral: Negative.        Objective:   Physical Exam  Constitutional: He is oriented to person, place, and time. He appears well-developed and well-nourished.  HENT:  Head: Normocephalic and atraumatic.  Eyes: Conjunctivae are normal.  Neck: No thyromegaly present.  Cardiovascular: Normal rate, regular rhythm and normal heart sounds.   Pulmonary/Chest: Breath sounds normal.  Abdominal: Soft. Bowel sounds are normal. He exhibits no distension. There is no tenderness. There is no rebound and no guarding.  Musculoskeletal: Normal range of motion.  Neurological: He is  alert and oriented to person, place, and time.  Skin: Skin is warm and dry.  Psychiatric: He has a normal mood and affect.          Assessment & Plan:  70 year old male with recent admission in March for abdominal pain and rectal bleeding. EGD/Colon performed inpatient without definite source of GI bleed; givens study performed. Likely transient focal ischemia. No further episodes of brbpr or melena. No Abdominal pain. Has occasional gas.   CBC today Probiotic daily, samples provided F/U in 3 mos or sooner as needed

## 2010-08-11 NOTE — Patient Instructions (Signed)
Complete lab work. We will call you with results.  Take a probiotic daily Return in 3 months or sooner if needed

## 2010-08-12 LAB — CBC WITH DIFFERENTIAL/PLATELET
Basophils Relative: 0 % (ref 0–1)
Hemoglobin: 14.5 g/dL (ref 13.0–17.0)
Lymphocytes Relative: 12 % (ref 12–46)
MCHC: 32.7 g/dL (ref 30.0–36.0)
Monocytes Relative: 11 % (ref 3–12)
Neutro Abs: 7.5 10*3/uL (ref 1.7–7.7)
Neutrophils Relative %: 76 % (ref 43–77)
RBC: 5.36 MIL/uL (ref 4.22–5.81)
WBC: 9.9 10*3/uL (ref 4.0–10.5)

## 2010-08-12 NOTE — Progress Notes (Signed)
Tried to call pt- LMOM 

## 2010-08-12 NOTE — Progress Notes (Signed)
Pt aware.

## 2010-08-16 ENCOUNTER — Encounter: Payer: Self-pay | Admitting: Gastroenterology

## 2010-08-23 NOTE — Consult Note (Signed)
NAME:  [name], [name]             ACCOUNT NO.:  [id number]  MEDICAL RECORD NO.:  [id number]           PATIENT TYPE:  I  LOCATION:  IC11                          FACILITY:  [facility]  PHYSICIAN:  Lionel December, M.D.    DATE OF BIRTH:  Jan 16, 1941  DATE OF CONSULTATION:  07/23/2010 DATE OF DISCHARGE:                                CONSULTATION   REASON FOR CONSULTATION:  Acute onset of abdominal pain and rectal bleeding.  HISTORY OF PRESENT ILLNESS:  [name] is a 70 year old African American male who was waiting to see Dr. Sherwood Gambler at his office when he got sick.  The patient states that his wife has been in the hospital and he has been staying up and also spending day with her and this morning when he woke up, he had some headache.  He feels, might have been because he did not eat well the day before.  He also noted some earache and cough and thought he may have sinusitis.  At any rate, he did not have any fever or chills.  Other than that, he felt well.  He had breakfast consisting of eggs, cheese and hash brown.  He had an appointment to see Dr. Sherwood Gambler regarding cellulitis involving right lower extremity.  While waiting to be seen, he developed severe nausea and diaphoresis.  He had an urge to have a bowel movement.  He quickly ran to the bathroom.  He had a normal stool followed by passage of large amount of bright red blood which turned dark towards the ends.  He also noted severe cramping across his upper and lower abdomen, but mainly in the left upper quadrant.  He states when he walked to the bathroom, Dr. Sherwood Gambler noted for him to be cold and clammy and he looked white.  Dr. Sherwood Gambler recommended he should be transported to emergency room and his son brought him here. He was evaluated in the emergency room and hospitalized to Dr. Sumner Boast service.  The patient states he has had diarrhea recently, but lately he had been having normal stools.  He had a colonoscopy by Dr. Jena Gauss on  June 23, 2010, with removal of 5-mm adenoma from his splenic flexure.  He was also noted to have left-sided diverticulosis. The patient recently has been on antibiotic, but he does not remember the name.  He has not experienced any fever recently.  He states his appetite has been good, although he has not been eating well since his wife is currently being treated at this facility.  He complains of chronic lower extremity edema.  He says this is despite the fact he is on a diuretic therapy twice daily.  He has not had any involuntary or voluntary weight loss.  Presently, he has some mild nausea.  He complains of pain primarily across his upper abdomen and more on the left than on the right side. He feels like he could drink liquids.  He also complains of bilateral flank pain radiating into his genitalia.  He has history of kidney stones, but he denies dysuria or hematuria.  He also denies chest pain or shortness of breath.  At home, he has been on: 1. Prevacid 15 mg p.o. q.a.m. 2. Metoprolol 12.5 mg every morning. 3. Tamsulosin 0.4 mg p.o. nightly. 4. Furosemide 80 mg p.o. b.i.d. 5. Levothyroxine 25 mcg p.o. daily. 6. Tylenol p.r.n. 7. He does not take any OTC, NSAIDs.  He is currently on: 1. Morphine 1-2 mg IV q.4 p.r.n. 2. Zofran 4 mg IV q.6 p.r.n. 3. He is also on IV fluids and IV vancomycin 1.25 g IV q.12 h. for his     cellulitis.  PAST MEDICAL HISTORY:  He has multiple medical problems.  He has had problems with obesity for over 10 years.  He states when he retired 10 years ago, he was around 220 pounds and he has gained close to 70-80 pounds.  He states he is not able to exercise on account of his knee arthritis.  He has been hypertensive for about 16 years.  He has history of CHF secondary to diastolic dysfunction and he was hospitalized in May 2010.  He also has hyperlipidemia and has been on therapy for hyperlipidemia for 4 years.  Few years ago, he was told fatty  livers, but he does not remember his LFTs have ever been elevated.  History of urolithiasis.  He had stenting about 6 years ago in [place], [place].  Last year, he was admitted to this facility with pyelonephritis and urolithiasis which was in August 2011.  He has history of peptic ulcer disease and chronic GERD.  He had EGD by Dr. Jena Gauss in December 2007, which is normal.  He has history of colonic polyps.  He had multiple small adenomas removed from his ascending colon December 2007.  His last colonoscopy was on June 23, 2009, with removal of 5-mm adenoma from his splenic flexure.  He also had scattered diverticula involving the sigmoid and descending colon.  He had right knee arthroscopy 15 years ago.  He had left knee arthroscopy 15 years ago.  He had severe osteoarthrosis involving the right knee.  He had left shoulder surgery repair rotator cuff tear in 2004 or 2005.  He had laparoscopic cholecystectomy in February 2009.  He had lumbar spine surgery possible for disk disease in 7 or 8 years ago.  ALLERGIES:  Allergy/intolerance to ASPIRIN resulting in peptic ulcer disease with bleeding and upset stomach.  FAMILY HISTORY:  Both parents are diseased.  Father died at age 17.  He was treated for colorectal carcinoma at age 63 and he also had prostate CA.  Mother had CA pancreas and died within a year or less than a year at age 59.  He has a brother with diabetes mellitus and he has 3 sisters in good health.  SOCIAL HISTORY:  He is married.  His wife is not doing very well.  She is the patient at this facility at the present time.  He has 5 children, four are in good health.  One son in his 23s has severe rheumatoid arthritis.  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  He smoked cigars for couple of years, but quit 50 years  ago and he used to drink alcohol socially, but quit over 35 years ago.  OBJECTIVE:  VITAL SIGNS:  Weight 139.7 kg which translates to 303 pounds, he is 66 inches tall, pulse 84 per minute and regular, blood pressure 134/88,  temp is 97.6 and respirations 20. EYES:  Conjunctivae are pink.  Sclerae are nonicteric. MOUTH:  Oropharyngeal mucosa is normal.  Few teeth are missing, rest in satisfactory condition. NECK:  No neck masses or thyromegaly noted. CARDIAC:  With regular rhythm.  Normal S1 and S2.  No murmur or gallop noted. LUNGS:  Clear to auscultation. ABDOMEN:  Obese.  Bowel sounds are normal on palpation, soft abdomen with tenderness in epigastric region and left upper quadrant and he also complains of discomfort in right lower quadrant when he is being palpated on the left side.  Liver edge is indistinct.  No guarding or rebound noted. RECTAL:  Deferred.  His scrotal swelling felt to be due to hydrocele the right side. EXTREMITIES:  He has 2+ pitting edema to the level of his knees.  He also has stasis dermatitis with warmth skin of right leg, but no open area noted.  There is also some erythema at the skin over dorsal aspect of the right foot.  LABORATORY DATA FROM ADMISSION:  WBC 10.1, H and H 13.9 and 43.4, platelet count 120 K, segs 82, 0 bands.  INR 0.97 with BNP less than 30. Lactic acid is 200 millimoles per liter which was normal.  Sodium 143, potassium 4.0, chloride 103, CO2 33, glucose 94, BUN 11, creatinine 0.93, total bilirubin is 0.8, AP 59, SGOT 18, SGPT 17, total protein 6.1 with albumin of 3.1 and calcium is 9.3.  Prior data also reviewed.  He had abdominopelvic CT without contrast in August 2011, and he had diffuse fatty liver, but spleen was not enlarged.  He also had bilateral perinephric stranding enlarged prostate.  He had a CT angio chest and abdomen in December 2010, which did not reveal any abnormality to celiac trunk SMA, IMA and he had focal  areas of peripheral enhancement within the liver, either transient hepatic attenuation difference or atypical hemangiomas.  ASSESSMENT:  Mr. Coopman is a 70 year old African American male with multiple medical problems who presents with acute onset of nausea associated with abdominal pain followed by normal stools and then passage of large amount of bright red blood which subsequently became dark.  He is now feeling better.  He does complain of abdominal pain which is primarily in epigastric region and left upper quadrant.  He had a colonoscopy 1 month ago with removal of a single small adenoma and he was noted to have diverticulosis.  The patient appears to be stable hemodynamically.  His presentation is very suspicious for ischemic colitis.  Diverticular bleed also remains in differential diagnosis except most patient's would not experience excruciating cramps that he did.  RECOMMENDATIONS:  We will monitor his H and H as you are doing.  We will obtain abdominopelvic CT with contrast in a.m.  I have taken the liberty of starting him on pantoprazole 40 mg IV today then p.o. q.a.m. for his chronic GERD.  We appreciate the opportunity to participate in the care of this gentleman.  Please note Dr. Jena Gauss will be back on the service and assume his care on July 26, 2010.     Lionel December, M.D.     NR/MEDQ  D:  07/23/2010  T:  07/23/2010  Job:  [id number]  cc:   Madelin Rear. Sherwood Gambler, MD Fax: [phone]  R. Roetta Sessions, M.D. [address] [place] [address]  Electronically Signed by Lionel December M.D. on 08/23/2010 10:04:25 AM

## 2010-08-31 LAB — URINALYSIS, ROUTINE W REFLEX MICROSCOPIC
Glucose, UA: NEGATIVE mg/dL
Glucose, UA: NEGATIVE mg/dL
Leukocytes, UA: NEGATIVE
Protein, ur: 100 mg/dL — AB
Specific Gravity, Urine: 1.01 (ref 1.005–1.030)
Urobilinogen, UA: 0.2 mg/dL (ref 0.0–1.0)
Urobilinogen, UA: 0.2 mg/dL (ref 0.0–1.0)
pH: 7.5 (ref 5.0–8.0)

## 2010-08-31 LAB — DIFFERENTIAL
Basophils Absolute: 0 10*3/uL (ref 0.0–0.1)
Basophils Absolute: 0 10*3/uL (ref 0.0–0.1)
Basophils Relative: 0 % (ref 0–1)
Eosinophils Absolute: 0.1 10*3/uL (ref 0.0–0.7)
Eosinophils Absolute: 0.1 10*3/uL (ref 0.0–0.7)
Eosinophils Relative: 1 % (ref 0–5)
Eosinophils Relative: 1 % (ref 0–5)
Eosinophils Relative: 1 % (ref 0–5)
Lymphocytes Relative: 10 % — ABNORMAL LOW (ref 12–46)
Lymphocytes Relative: 11 % — ABNORMAL LOW (ref 12–46)
Lymphocytes Relative: 9 % — ABNORMAL LOW (ref 12–46)
Lymphs Abs: 1 10*3/uL (ref 0.7–4.0)
Monocytes Absolute: 0.9 10*3/uL (ref 0.1–1.0)
Monocytes Absolute: 1.1 10*3/uL — ABNORMAL HIGH (ref 0.1–1.0)
Neutrophils Relative %: 81 % — ABNORMAL HIGH (ref 43–77)

## 2010-08-31 LAB — BASIC METABOLIC PANEL
BUN: 10 mg/dL (ref 6–23)
CO2: 32 mEq/L (ref 19–32)
Calcium: 8.9 mg/dL (ref 8.4–10.5)
Creatinine, Ser: 0.82 mg/dL (ref 0.4–1.5)
GFR calc non Af Amer: >60 mL/min (ref >60)
GFR calc non Af Amer: >60 mL/min (ref >60)
Glucose, Bld: 109 mg/dL — ABNORMAL HIGH (ref 70–99)
Glucose, Bld: 109 mg/dL — ABNORMAL HIGH (ref 70–99)
Potassium: 3.9 mEq/L (ref 3.5–5.1)
Potassium: 4.2 mEq/L (ref 3.5–5.1)
Sodium: 139 mEq/L (ref 135–145)
Sodium: 141 mEq/L (ref 135–145)

## 2010-08-31 LAB — CBC
HCT: 40.8 % (ref 39.0–52.0)
HCT: 41 % (ref 39.0–52.0)
HCT: 41.2 % (ref 39.0–52.0)
Hemoglobin: 13.7 g/dL (ref 13.0–17.0)
Hemoglobin: 14 g/dL (ref 13.0–17.0)
MCHC: 34.2 g/dL (ref 30.0–36.0)
MCV: 82.6 fL (ref 78.0–100.0)
MCV: 83.4 fL (ref 78.0–100.0)
Platelets: 134 10*3/uL — ABNORMAL LOW (ref 150–400)
Platelets: 143 10*3/uL — ABNORMAL LOW (ref 150–400)
RDW: 16.2 % — ABNORMAL HIGH (ref 11.5–15.5)
RDW: 16.2 % — ABNORMAL HIGH (ref 11.5–15.5)
WBC: 9.5 10*3/uL (ref 4.0–10.5)

## 2010-08-31 LAB — CARDIAC PANEL(CRET KIN+CKTOT+MB+TROPI)
CK, MB: 0.5 ng/mL (ref 0.3–4.0)
CK, MB: 0.6 ng/mL (ref 0.3–4.0)
Relative Index: INVALID (ref 0.0–2.5)
Total CK: 45 U/L (ref 7–232)
Total CK: 55 U/L (ref 7–232)
Troponin I: 0.01 ng/mL (ref 0.00–0.06)
Troponin I: <0.01 ng/mL (ref 0.00–0.06)

## 2010-08-31 LAB — BRAIN NATRIURETIC PEPTIDE: Pro B Natriuretic peptide (BNP): <30 pg/mL (ref 0.0–100.0)

## 2010-08-31 LAB — T3, FREE: T3, Free: 2.5 pg/mL (ref 2.3–4.2)

## 2010-08-31 LAB — HEPATIC FUNCTION PANEL
AST: 18 U/L (ref 0–37)
Albumin: 3 g/dL — ABNORMAL LOW (ref 3.5–5.2)
Total Protein: 5.8 g/dL — ABNORMAL LOW (ref 6.0–8.3)

## 2010-08-31 LAB — HEMOGLOBIN A1C
Hgb A1c MFr Bld: 4.4 % — ABNORMAL LOW (ref 4.6–6.1)
Mean Plasma Glucose: 80 mg/dL

## 2010-08-31 LAB — URINE MICROSCOPIC-ADD ON

## 2010-08-31 LAB — TSH: TSH: 7.019 u[IU]/mL — ABNORMAL HIGH (ref 0.350–4.500)

## 2010-08-31 LAB — POCT CARDIAC MARKERS
CKMB, poc: <1 ng/mL — ABNORMAL LOW (ref 1.0–8.0)
Myoglobin, poc: 42.5 ng/mL (ref 12–200)

## 2010-08-31 LAB — URINE CULTURE

## 2010-10-05 NOTE — Assessment & Plan Note (Signed)
NAME:  SEDALE, JENIFER              CHART#:  [id number]   DATE:  10/26/2007                       DOB:  May 12, 1941   FOLLOWUP:  Diarrhea.   Mr. Brandl returns after last being seen on September 12, 2007.  He was  found to have positive lactoferrin on stool assay and yeast in the  stool.  We started him on Align and a 7-day course of Diflucan.  He is  very pleased, and his bowel function has returned to what he considers  normal having 1 formed bowel movement daily.  He is not having rectal  bleeding.  He had an EGD and colonoscopy, which demonstrated nothing  significant.  CT of the abdomen demonstrated nonspecific liver lesion.  This was not borne out on MRI.  He has had a tendency towards diarrhea  since getting his gallbladder out, but now he is doing very well.  He is  currently not on any acid suppression therapy, and he tells me his  reflux symptoms have not been an issue.   CURRENT MEDICATIONS:  See updated list.   ALLERGIES:  Aspirin and Nexium.   PHYSICAL EXAMINATION:  Appears well.  Weight 311, height 5 feet 9 inches, temperature 98.0, BP 120/74, pulse  76.  ABDOMEN:  Obese, soft, nontender.  No appreciable mass or organomegaly.   ASSESSMENT:  Diarrhea, much improved status post course of Diflucan  therapy and addition of probiotic on a daily basis.  Reflux symptoms  quiescent for the time being.   RECOMMENDATIONS:  Since he is not having any frequent reflux symptoms, I  told him he would be just well trying something like Pepcid Complete on  a p.r.n. basis as long as he is not having more than 2 or 3 episodes of  reflux weekly.  I told him he can take Align indefinitely as this a  benign dietary supplement, which appears to have help him significantly.  Unless something comes up,plan to see this nice gentleman back in 6  months.       [name], M.D.  Electronically Signed    RMR/MEDQ  D:  10/26/2007  T:  10/27/2007  Job:  [id number]   cc:   Madelin Rear.  Sherwood Gambler, MD

## 2010-10-05 NOTE — Assessment & Plan Note (Signed)
NAME:  [name] [name], [name] [name]              CHART#:  [id number]   DATE:  11/19/2007                       DOB:  1941-03-25   CHIEF COMPLAINT:  Diarrhea, nausea, and upper abdominal pain.   PROBLEM LIST:  1. History of chronic abdominal bloating, abdominal pain possible      ileus.  2. Symptomatic cholelithiasis status post cholecystectomy by Dr.      Lovell Sheehan 07/16/2007.  3. Chronic gastroesophageal reflux disease with last EGD 05/22/2006 by      Dr. Jena Gauss, he was found to have very normal exam.  4. Last colonoscopy at the same day with multiple colonic adenoma and      a left-sided diverticula.  5. Fatty liver.  6. Hypertension.  7. Renolithiasis.  8. Multiple knee, arm, and shoulder surgeries.  9. Normal gastric emptying study on 02/28/2007.  10.Renal insufficiency.  11.Right inguinal herniorrhaphy.  12.Status post Helicobacter pylori treatment.   SUBJECTIVE:  The patient is a 70 year old male who presents with  diarrhea, nausea, and upper abdominal pain, which started about 3 weeks  ago.  He was actually seen by me on 11/08/2007 as well.  He had what  appeared to be a gastroenteritis with a possible ileus.  He had had  workup at that time, which included acute abdominal films and a CT scan  of the abdomen.  CT scan of the abdomen and pelvis with contrast on June  2009, which showed fatty infiltration of the liver with scattered foci  of heterogeneous attenuation as evaluated on prior MRI and  prostatomegaly.  He has recently been started on Synthroid.  He tells me  his diarrhea comes and goes.  When he does not have diarrhea, he feels  constipated.  Three days ago, he had nausea.  Two days ago, he was fine  and actually he consumed a meal with fish, beans, and potato salad and  felt well, had no abdominal pain, nausea, or vomiting.  Yesterday, he  began to have nausea.  Today, he has had several loose stools, nausea,  upper abdominal pain, and headache.  His weight is down 9 pounds  since  he was last seen 11 days ago.  He has had his diuretic therapy change  since that time as well.  He notes a frontal headache.  He has had 4  loose bowel movements this morning.  He tells me his upper abdominal  epigastric pain feels like grumblings are running around his stomach  with knives.  He complains of proctalgia as well.  His lower extremity  edema has improved.  At times, his upper abdominal pain is 10/10 on pain  scale.  He denies any rectal bleeding or melena.  He did have a C. diff  previously as he had been on antibiotics, which was negative.   He generally has one bowel movement per day.  He has been having some  straining with small amounts of stool.  He usually takes Metamucil once  or twice per day.   CURRENT MEDICATIONS:  See updated list from November 19, 2007.   ALLERGIES:  Aspirin and Nexium.   OBJECTIVE:  VITAL SIGNS:  Weight 307 pounds, height 69 inches,  temperature 97.9, blood pressure 120/88, and pulse 88.  GENERAL:  The patient a morbidly obese African American male who is  alert, oriented, and pleasant,  and cooperative in acute distress.  HEENT.  Sclerae clear.  Nonicteric.  Conjunctivae pink.  Oropharynx pink  and moist without lesions.  CHEST:  Heart regular rhythm.  Normal S1 and  S2.  No murmurs, clicks, rubs, or gallops.  ABDOMEN:  Protuberant with  positive bowel sounds x4.  No bruits.  Her abdomen appears slightly  distended to me.  Abdomen is soft, nontender without palpable mass or  hepatosplenomegaly.  No rebound, tenderness, or guarding.  Exam is  limited given the patient's body habitus.  EXTREMITIES:  With 2+ pitting ankle edema bilaterally.   ASSESSMENT:  The patient is a 70 year old male with now 3-week history  of upper abdominal pain, diarrhea, nausea, and vomiting, which is  intermittent.  Interestingly, he can have diarrhea some days but not  others.  He has nausea and vomiting, which is intermittent as well.  Recent MRI as well as  CT scan have not shown any significant pathology  nor has recent labs, and he is also status post recent cholecystectomy.  It is possible that he could have recurrent ileus causing his nausea,  peptic ulcer disease is possible as well.  I doubt we are dealing with  infectious processes.  His diarrhea is intermittent.  It is possible  that this could be functional abdominal pain.   It is possible he could have small bowel bacterial overgrowth.   He does have history of gastroesophageal reflux disease and also history  of multiple adenomatous polyps.   PLAN:  1. Upper gastrointestinal with small bowel follow through.  2. Continue 20 mg Prilosec b.i.d.  3. CBC, LFTs, MET-7, amylase, and lipase today.   ADDENDUM:  UGI/SBFT-rapid small bowel transit.       Lorenza Burton, N.P.  Electronically Signed     Kassie Mends, M.D.  Electronically Signed    KJ/MEDQ  D:  11/19/2007  T:  11/20/2007  Job:  [id number]   cc:   Madelin Rear. Sherwood Gambler, MD

## 2010-10-05 NOTE — Discharge Summary (Signed)
NAME:  [name] [name], [name] [name]             ACCOUNT NO.:  [id number]   MEDICAL RECORD NO.:  [id number]          PATIENT TYPE:  INP   LOCATION:  A316                          FACILITY:  [facility]   PHYSICIAN:  Dorris Singh, DO    DATE OF BIRTH:  July 09, 1940   DATE OF ADMISSION:  10/01/2008  DATE OF DISCHARGE:  05/14/2010LH                               DISCHARGE SUMMARY   ADMISSION DIAGNOSES:  1. Chest pain.  2. Shortness of breath.  3. History of hypertension.  4. History of CHF.  5. History of dyslipidemia.  6. History of sleep apnea.   DISCHARGE DIAGNOSES:  1. Chest pain resolved, noncardiac.  2. Sleep apnea.  3. Right-sided heart failure.  4. History of hypertension.  5. History of dyslipidemia.  6. Elevated TSH with a history of hypothyroidism and abnormal UA      without clinical symptoms present.   PRIMARY CARE PHYSICIAN:  Dr. Sherwood Gambler.   TESTING:  1. Portable chest x-ray on May 12 which demonstrated no active      disease.  2. A CT angiogram of the chest demonstrated no pulmonary emboli or      significant chest pathology, mild dependent atelectasis, and a deep      vein thrombosis of bilateral leg showed negative DVT.   CONSULTATIONS:  [facility].   HOSPITAL COURSE:  The patient is a 70 year old African male who  presented to the [facility] emergency room for shortness of breath and  swelling in legs and chest pain which had started over the last 3 days.  He had gotten progressively weak and he was brought here to the  emergency room for evaluation.  He was admitted with the above  diagnoses.  He was admitted with the above diagnoses.  [facility] saw him today.  They felt that some of his issues regarding  his chest pain could be due to the fact that he is supposed to be  wearing a CPAP and is not, also that his TSH was not within normal  limits.  They recommended that he be started on Aldactone and weight  reduction and diet are the things they  have recommended, and also for  him to start wearing his CPAP and to get a reevaluation or titration at  this point in time.  He has also recommended for him to follow up with  Dr. Garen Lah as an outpatient.  During his hospital course, the patient  has been doing well.  He has not had any complaints.  He had a 2-D echo  today which demonstrated EF fraction of 60-65%, systolic function was  normal.  Wall motion was normal.  Aortic valve was not restricted.  Left  ventricle was within normal limits.  Please look at report to review  more detail.  It was determined on May 14th, that the patient could be  discharged later on that day.  It is unsure if [facility]  will come to see him, but will hold him until noon for them to  reevaluate him.  We will check his labs in the morning and we  will see  if he has improved, but will go ahead and set up follow-up appointments  for him to see Dr. Gerilyn Pilgrim and for sleep study reevaluation, Dr. Sherwood Gambler  to check his TSH in 4-6 weeks, and also do a repeat UA which did show  bacteria.  However, the patient is not complaining of any symptomatology  of a UTI, and also [facility] for follow-up on this  systematology that he has currently had.   DISCHARGE MEDICATIONS:  He will be sent home on the following  medications which include:  1. Simvastatin 40 mg p.o. daily.  2. Metoprolol tartrate 12.5 mg twice a day.  3. Furosemide 40 mg daily.  4. Levothyroxine will be increased to 50 mcg p.o. daily.  5. Micardis 40 mg p.o. daily.  6. Combigan one drop each eye twice a day.  7. Travatan one drop each eye at night.  8. Aldactone which is new medication at 25 mg p.o. daily.   CONDITION ON DISCHARGE:  The patient's condition is stable.   DISPOSITION:  To home.      Dorris Singh, DO  Electronically Signed     CB/MEDQ  D:  10/03/2008  T:  10/03/2008  Job:  [id number]

## 2010-10-05 NOTE — Assessment & Plan Note (Signed)
NAME:  [name] [name], [name] [name]              CHART#:  [id number]   DATE:  09/12/2007                       DOB:  04-30-1941   He was last seen here actually back 06/12/07 for nonspecific post  prandial upper abdominal pain, some vague reflux symptoms.  He had had  an EGD and a colonoscopy, demonstrated some hemorrhoids, nothing  significant in his upper GI tract.  CT and MRI demonstrated nonspecific  liver lesion on CT, not born out on MRI.  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  He did have gallstones.  We wanted him to get  a HIDA, he declined, wanted to go straight to surgery.  He, indeed, did  see Dr. Dalia Heading, underwent a cholecystectomy.  This really did  not help the above-mentioned symptoms.  He continues to have them.  Nexium did not work for him previously.  He was on Aciphex but his  insurance company would not pay for it, now he is on Prilosec 20 mg once  daily and it really is not helping very much.  He has had a several-week  history of diarrhea too.  He is taking Pepto-Bismol and Imodium for  diarrhea.  He has been on Bactrim for inner ear problems.  He has not  had any hematochezia, although he does have some burning when he has a  bowel movement.  Had a battery of labs on May 28, 2007.  CBC, BMET,  LFTs, amylase, lipase, all of which checked out A-okay.  He is not  taking any nonsteroidals aside from a p.r.n. dose of Alka-Seltzer.   CURRENT MEDICATIONS:  See updated list.   ALLERGIES:  Aspirin, Nexium.   EXAM TODAY:  He appears his baseline, weight 306, which is actually down  2-1/2 pounds.  Height 5'9, temp 97.5, BP 120/70, pulse 76.  SKIN:  Warm and dry.  HEENT EXAM:  He has xanthelasma present.  No scleral icterus.  CHEST:  Lungs are clear to auscultation.  CARDIAC EXAM:  Regular rate and rhythm without murmur, gallop, rub.  ABDOMEN:  Massively obese.  Positive bowel sounds.  Soft.  Nontender, no  obvious mass or hepatosplenomegaly.  Exam is limited  by abdominal girth.  RECTAL EXAM:  Externally he has a couple of small tags.  I do not see  fissure.  He actually has some mild anal stenosis by my DRE but no mass  in the rectal vault.  There are no areas of particularly exquisite  tenderness.  Scant brown stool is Hemoccult negative.   IMPRESSION:  Nonspecific symptoms of nausea for abdominal discomfort,  some vague reflux symptoms.  He has had some diarrhea recently and  anorectal discomfort with increased stooling.  Gallbladder is out.  Does  not sound like he really had much improvement from cholecystectomy.  I  do not think we are dealing with any ominous pathology given his recent  laparoscopy and multiple imaging and laboratory studies, as well as  upper and lower endoscopy in the recent past.  He has been exposed to  antibiotics recently.  He may have an occult fissure as well.   RECOMMENDATIONS:  1. Stop Prilosec.  Begin Capodex, 60 mg, a capsule once daily, samples      given.  2. Begin probiotic in the way of Align, 1 capsule daily, samples  given.  3. Anusol-HC suppositories, one per rectum at bedtime times 10 days.  4. Get a set of stool studies off.  5. Plan to see this nice gentleman back in 1 month and assess his      progress.       [name], M.D.  Electronically Signed     RMR/MEDQ  D:  09/12/2007  T:  09/12/2007  Job:  [id number]   cc:   Madelin Rear. Sherwood Gambler, MD

## 2010-10-05 NOTE — Assessment & Plan Note (Signed)
NAME:  [name] [name], [name] [name]              CHART#:  [id number]   DATE:  02/16/2007                       DOB:  08-05-1940   HISTORY OF PRESENT ILLNESS:  [name] [name] is a 70 year old obese male  with a 2-week history of nausea. He has a past medical history of  chronic GERD and erosive reflux esophagitis. He has not been taking PPI  on a regular basis. He had previously been on Nexium. He tells me it  caused nausea and diarrhea. He had tried some over the counter Prilosec  but felt that this did not seem to help. He complains of intermittent  nausea. He complains of a constant bloating and indigestion. He  complains of epigastric pain. He has been taking Rolaids and Tums, both  of which do not seem to give much relief. His weight, however, is  steadily increasing. He denies any vomiting. He has noticed ptyalism as  well. He has restarted Metrical for his constipation. This does seem to  work. He is generally having 2 to 3 bowel movements per day now. He  denies any NSAID or aspirin use. He had a colonoscopy and EGD by Dr.  Jena Gauss, on 05/22/06. EGD was normal. Colonoscopy showed a left-sided  diverticula and multiple right colon polyps which were adenomatous. He  is due for surveillance colonoscopy in January of 2011.   CURRENT MEDICATIONS:  See the list from 02/16/07.   ALLERGIES:  1. ASPIRIN.  2. NEXIUM.   OBJECTIVE:  VITAL SIGNS: Weight 350 pounds, height 69 inches, temp 98.1,  blood pressure 120/70, pulse 80.  GENERAL: The patient is a morbidly obese African-American male who is  alert, oriented, pleasant, cooperative, in no acute distress.  HEENT: Pupils equal, sclerae clear, nonicteric. Conjunctivae are pink.  Oropharynx pink and moist without any lesions.  CHEST: Heart is regular rate and rhythm, normal S1, S2.  ABDOMEN: Protuberant and massively obese with positive bowel sounds x4.  No bruits auscultated. He does have Murphy point tenderness. There is no  rebound tenderness or  guarding. Unable to palpate hepatomegaly. He does  appear to have splenomegaly. Unable to palpate any masses. Exam is  limited given the patient's  body habitus.  EXTREMITIES:  With 1+ pretibial and ankle edema bilaterally.   IMPRESSION:  [name] [name] is a 70 year old, morbidly obese, African-  American male with nausea of 3 weeks' duration as well as abdominal  bloating and epigastric discomfort along with heartburn and indigestion.  I suspect he has poorly-controlled gastroesophageal reflux disease and  has been off proton pump inhibitor. It is also possible we could be  dealing with gallbladder disease. Also gastroparesis should remain in  the differential.   History of adenomatous polyps.   PLAN:  1. Abdominal ultrasound as soon as possible to rule out      cholelithiasis.  2. GERD literature given for his review.  3. Aciphex 20 mg daily, #31 with 5 refills.  4. Next colonoscopy in January of 2011.  5. Urged weight loss.       Lorenza Burton, N.P.  Electronically Signed     Kassie Mends, M.D.  Electronically Signed    KJ/MEDQ  D:  02/16/2007  T:  02/16/2007  Job:  [id number]   cc:   Madelin Rear. Sherwood Gambler, MD

## 2010-10-05 NOTE — Op Note (Signed)
NAME:  [name] [name], [name] [name]             ACCOUNT NO.:  [id number]   MEDICAL RECORD NO.:  [id number]          PATIENT TYPE:  AMB   LOCATION:  DAY                           FACILITY:  [facility]   PHYSICIAN:  Dalia Heading, M.D.  DATE OF BIRTH:  1941/01/13   DATE OF PROCEDURE:  07/16/2007  DATE OF DISCHARGE:                               OPERATIVE REPORT   PREOPERATIVE DIAGNOSIS:  Cholecystitis, cholelithiasis.   POSTOPERATIVE DIAGNOSIS:  Cholecystitis, cholelithiasis.   PROCEDURE:  Laparoscopic cholecystectomy.   SURGEON:  Dr. Franky Macho.   ANESTHESIA:  General endotracheal.   INDICATIONS:  The patient is a 70 year old black male who presents with  biliary colic secondary to cholelithiasis.  Risks and benefits of the  procedure including bleeding, infection, hepatobiliary injury, the  possibly of an open procedure were fully explained to the patient, who  gave informed consent.   PROCEDURE NOTE:  The patient was placed in supine position.  After  induction of general endotracheal anesthesia, the abdomen was prepped  and draped using the usual sterile technique with Betadine.  Surgical  site confirmation was performed.   A supraumbilical incision was made down to the fascia.  A Veress needle  was introduced into the abdominal cavity and confirmation of placement  was done using the saline drop test.  The abdomen was then insufflated  to 16 mmHg pressure.  An 11-mm trocar was introduced into the abdominal  cavity under direct visualization without difficulty.  The patient was  placed in reversed Trendelenburg position.  An additional 11-mm trocar  was placed in the epigastric region and 5-mm trocar was placed in the  right upper quadrant, right flank region, and upper midline.  The liver  was inspected and noted to be normal limits.  The gallbladder was  retracted superior and laterally.  Dissection was begun around the  infundibulum of the gallbladder.  The cystic duct was first  identified.  Its juncture to the infundibulum fully identified.  Endo clips were  placed proximally and distally on the cystic duct and cystic duct was  divided.  This likewise was done to cystic artery.  The gallbladder was  then freed away from the gallbladder fossa using Bovie electrocautery.  The gallbladder was delivered through the epigastric trocar site using  EndoCatch bag.  The gallbladder fossa was inspected.  No abnormal  bleeding or bile leakage was noted.  Surgicel was placed in the  gallbladder fossa.  All fluid and air were then evacuated from the  abdominal cavity prior to removal of the trocars.   All wounds were irrigated with normal saline.  All wounds were injected  with 0.5% Sensorcaine.  The supraumbilical fascia as well as epigastric  fascia reapproximated using 0-0 Vicryl interrupted sutures.  All skin  incisions were closed using staples.  Betadine ointment, dry sterile  dressings were applied.   All tape and needle counts correct at the end of the procedure.  The  patient was extubated in the operating room went back to recovery room  awake in stable condition.   COMPLICATIONS:  None.   SPECIMEN:  Gallbladder.  BLOOD LOSS:  Minimal.      Dalia Heading, M.D.  Electronically Signed     MAJ/MEDQ  D:  07/16/2007  T:  07/16/2007  Job:  [id number]   cc:   R. Roetta Sessions, M.D.  [address]  [place]  [place] [address]   Madelin Rear. Sherwood Gambler, MD  Fax: [phone]

## 2010-10-05 NOTE — Assessment & Plan Note (Signed)
NAME:  [name] [name], [name] [name]              CHART#:  [id number]   DATE:                                   DOB:  11-Jun-1940   CHIEF COMPLAINT:  Vague upper abdominal and chest discomfort.   HISTORY:  Last seen here on May 25, 2007, with increased abdominal  gas.  Sounds like a river running through his left chest and upper  abdomen.  Just saw Dr. Dani Gobble and had a negative cardiac workup.  He had an EGD and colonoscopy last year which demonstrated a colonic  adenoma, left-sided diverticula.  He had multiple adenomas.  He is  slated to have a follow-up colonoscopy in 2111.  The EGD demonstrated a  normal esophagus, small hiatal hernia, otherwise normal gastric mucosa.  He had a CT which demonstrated a questionable mass in his liver.  He  ultimately underwent an MRI orchestrated through this office, which  demonstrated a renal cyst, small hyper-dense lesion in the liver,  nonspecific, most likely representing a hemangioma.  Actually on MRI,  this lesion was felt to be heterogeneous fat. A small gallstone was  noted in the gallbladder and bilateral renal cysts.   A CBC came back normal.  BMET and liver function tests came back normal,  as did the amylase and lipase.   Reflux symptoms were controlled on Aciphex 20 mg daily.  He is trying to  now start to eat healthy.  He has lost 7-1/2 pounds since he was seen on  May 25, 2007.  He takes Metamucil on a p.r.n. basis for occasional  constipation.  He gets bloated with this agent and takes Gas-X.  He is  occasionally constipated.  He does describe some band-like upper  abdominal pain after he eats from time to time.  He has not had a fever  or chills.   CURRENT MEDICATIONS:  See the updated list.   ALLERGIES:  NEXIUM.   PHYSICAL EXAMINATION:  GENERAL:  He appears in no acute distress.  VITAL SIGNS:  Weight 308.5 pounds, height 5 feet 9 inches, temperature  98 degrees, blood pressure 120/76, pulse 84.  SKIN:  Warm and dry.  CHEST:   Lungs are clear to auscultation.  There is no chest wall  tenderness.  HEART:  A regular rate and rhythm.  No murmur, gallop or rub.  ABDOMEN:  Massively obese.  Positive bowel sounds, soft.  He has some  minimal epigastric and chest discomfort to palpation.  No appreciable  mass, no obvious mass or organomegaly.   ASSESSMENT:  [name] has some nonspecific postprandial upper  abdominal pain and gurgling in his left abdomen and chest.  The findings  on recent CT and MRI, EGD and colonoscopy are reassuring.  He has at  least one gallstone in his gallbladder.  His labs look good.  He has an  element of gastroesophageal reflux disease which I think is fairly well-  controlled on Aciphex.  The gurgling and increased gas and occasional  constipation, for which he takes Metamucil on a p.r.n. basis.  The  Metamucil intermittently may be contributing to his somewhat strange  left-sided abdominal and chest symptoms.  He could have early  gallbladder disease.   RECOMMENDATIONS:  1. Stop Metamucil.  It is a little too much fiber for him.  Will  ask      him to fortify his dietary intake with fiber, with fruits,      vegetables, whole wheat, etc.  2. Use MiraLax 17 grams orally at bedtime p.r.n. occasional      constipation.  3. Will go ahead and proceed with a HIDA with a fatty meal, just to      see if we can document biliary dyskinesia.  4. A telephone followup in the very near future.       [name], M.D.  Electronically Signed     RMR/MEDQ  D:  06/12/2007  T:  06/12/2007  Job:  [id number]   cc:   Madelin Rear. Sherwood Gambler, MD

## 2010-10-05 NOTE — Assessment & Plan Note (Signed)
NAME:  [name] [name], [name] [name]              CHART#:  [id number]   DATE:  05/25/2007                       DOB:  Dec 15, 1940   CHIEF COMPLAINT:  Left upper quadrant abdominal pain, gas, and bloating.   SUBJECTIVE:  The patient is a 70 year old male.  He has a history of  chronic GERD and erosive reflux esophagitis.  He also has a history of  intermittent nausea and being noncompliant with PPI.  He was last seen  02/16/2007.  Workup thus far has included a gastric emptying study which  was normal.  He had an abdominal ultrasound which showed a few small  gallstones without cholecystitis and mild fullness in the right renal  collecting system.  He was seen by Dr. Clelia Croft in [place], [place]  for his renal issues.  He complains of a left upper quadrant stabbing  constant pain.  He notes significant gas.  He notes a sensation of a  dripping faucet in his left upper quadrant.  He has had quite severe  pain for the last 2 weeks.  At times it is 10/10 on a pain scale.  He is  going to be seen by Dr. Domingo Sep, as he has been having some chest pain  as well.  He is to be seen by her on 06/04/2006.  He continues to take  Aciphex 20 mg daily.  He does have nausea that comes and goes throughout  the day.  His weight has remained stable.  He has a family history of a  mother deceased with pancreatic cancer.  He is having daily bowel  movements.  Denies any rectal bleeding or melena.  He has noticed dark  stools but only when he took Pepto.  Denies any dysphagia or  odynophagia.   CURRENT MEDICATIONS:  See the list from 05/25/2007.   ALLERGIES:  Aspirin, Nexium.   OBJECTIVE:  VITAL SIGNS:  Weight 316 pounds.  Height 5 feet 9 inches.  Temp 97.8.  blood pressure 122/84 and pulse 64.  GENERAL:  The patient is an obese male who is alert, oriented, pleasant,  cooperative and in no acute distress.  HEENT:  Sclerae are clear, non-icteric.  Conjunctivae pink.  Oropharynx  pink and moist without any  lesions.  CHEST:  Heart regular rate and rhythm.  Normal S1 and S2.  ABDOMEN:  Protuberant with positive bowel sounds x4.  No bruits  auscultated.  Soft and nondistended.  He does have mild left upper  quadrant tenderness on deep palpation.  There is no rebound tenderness.  Or guarding.  No hepatosplenomegaly or mass.  The exam is limited given  the patient's body habitus.  EXTREMITIES:  With 2+ pretibial edema and ankle edema bilaterally.  SKIN:  Warm and dry without any rash or jaundice.   IMPRESSION:  The patient is a 70 year old male with a 2-week history of  severe left upper quadrant pain.  He has had a history of erosive reflux  esophagitis and chronic intermittent nausea as well.  He has a history  of adenomatous polyps.  Recent evaluation has included a negative  gastric emptying study and u/s showing cholelithiasis without  cholecystitis.  It is possible that he may have passed a stone and could  have pancreatitis given his severe left upper quadrant pain and nausea  and further evaluation is warranted.  PLAN:  1. CBC, LFTs, amylase, lipase and Met 7.  2. He is to continue his Lasix 20 mg b.i.d. as ordered by Dr. Sherwood Gambler.      He admits to being noncompliant with this.  He has significant      lower extremity edema.  3. CT of abdomen and pelvis with IV and oral contrast if his      creatinine is normal.  4. Aciphex 20 mg daily #31 with 11 refills and 2 weeks worth of      samples given.  5. If he develops severe pain he is instructed to go immediately to      the emergency room.       Lorenza Burton, N.P.  Electronically Signed     R. Roetta Sessions, M.D.  Electronically Signed    KJ/MEDQ  D:  05/26/2007  T:  05/26/2007  Job:  [id number]   cc:   Madelin Rear. Sherwood Gambler, MD

## 2010-10-05 NOTE — H&P (Signed)
NAME:  [name], [name]             ACCOUNT NO.:  [id number]   MEDICAL RECORD NO.:  [id number]          PATIENT TYPE:  INP   LOCATION:  A316                          FACILITY:  [facility]   PHYSICIAN:  Dorris Singh, DO    DATE OF BIRTH:  Apr 13, 1941   DATE OF ADMISSION:  10/01/2008  DATE OF DISCHARGE:  LH                              HISTORY & PHYSICAL   HISTORY OF PRESENT ILLNESS:  The patient is a 70 year old African  American male who presented to the [facility] emergency room for  shortness of breath and swelling in the legs.  The patient stated over  the last 3 days he had noticed an increase in fatigue and weakness.  He  was sleeping more.  Also, had noticed that his legs were swelling more.  Eventually, today, his wife stated that she could not get him up.  She  called family members to help her.  The patient was complaining of chest  pain as well and could not breathe.  He mentions a history of sleep  apnea in which he sees his primary care doctor who is Dr. Sherwood Gambler, and has  been on CPAP at night.  However, that has been uncomfortable for him and  is now on nasal cannula at night.  His cardiologist is [facility].   PAST MEDICAL HISTORY:  1. CHF.  2. Hypertension.  3. Hypercholesterolemia.  4. Kidney .  5. Inguinal hernia.  6. Mentions borderline diabetic.   SURGICAL HISTORY:  1. Cholecystectomy.  2. Lithotripsy.  3. Right knee surgery.  4. Rotator cuff repair of his right shoulder.  5. Stent placed in one of his kidneys.   SOCIAL HISTORY:  He is married, nonsmoker, nondrinker.  Denies any drug  use.   ALLERGIES:  ASPIRIN.   CURRENT MEDICATIONS:  1. Combigan one drop each eye at bedtime.  2. Simvastatin 40 mg at bedtime.  3. Metoprolol tartrate 12.5 mg twice a day.  4. Furosemide 40 mg t.i.d.  5. Prilosec OTC.  6. Micardis 80 mg daily.  7. Levothyroxine 25 mg once daily.  8. Oxygen 2 liters at bedtime.   REVIEW OF SYSTEMS:  CONSTITUTIONALLY:   Positive for weakness and  fatigue.  EYES:  Negative for changes in vision.  EARS, NOSE, MOUTH AND  THROAT:  All negative.  HEART:  Positive for chest pain.  RESPIRATORY:  Positive shortness of breath.  GI:  Negative for nausea, vomiting,  abdominal pain or diarrhea.  GU:  Negative.  MUSCULOSKELETAL:  Negative  for any arthralgias or arthritis of positive for lower leg edema.  NEUROLOGICAL:  Negative for loss of consciousness but positive for  confusion.  PSYCHIATRIC:  Negative for depression.   PHYSICAL EXAMINATION:  VITAL SIGNS:  Temperature 96.9, pulse 79,  respirations 20, blood pressure 139/72.  GENERAL:  The patient is a well-developed, well-nourished African  American male who looks his stated age who is obese but is in no acute  distress.  HEENT:  His head is normocephalic, atraumatic.  EYES:  Have some  redness, EOMI and PERLA.  There is no mild conjunctival  injection but no  scleral icterus.  Ears: TMI's are visualized bilaterally.  Throat is  clear.  Eyes have xanthelasma bilaterally and throat is clear.  SPINE:  Nontender.  CHEST:  Breasts breath are symmetrical throughout.  HEART:  Regular rate and rhythm.  No murmur noted.  LUNGS:  Clear to auscultation.  No rales, wheezes or rhonchi.  ABDOMEN:  Soft, nontender, however, is obese and pendulous.  EXTREMITIES:  Full range of motion, +3 edema of bilateral legs with  dermatitis stasis.  NEUROLOGICAL:  Cranial nerves II-XII grossly intact.  Sensation is  normal.  SKIN:  Good turgor, good texture.  There are no decubitus ulcers or any  other rashes noted.   STUDIES:  EKG shows 62 beats per minute, normal sinus rhythm.  Normal  axis, normal P QRS, normal ST.  BNP is less than 30.  Sodium is 139,  potassium is 4.2, chloride is 103, carbon dioxide 32, glucose 89, BUN  10, creatinine 0.9.  His troponins are less than 0.05 white count 9.5,  hemoglobin 13.7, hematocrit 41.0 and platelet count of 145.  He did have  a portable  chest x-ray which shows no active disease.  Also, had a CT of  the chest which shows no pulmonary emboli or significant chest  pathology, mild dependent atelectasis.  The patient reports never having  a echocardiogram.  May need to pursue this as well at this point in  time.   ASSESSMENT/PLAN:  1. Chest pain.  2. Shortness of breath.  3. History of hypertension.  4. History of CHF.  5. History of dyslipidemia.  6. History of sleep apnea.   PLAN:  Wanted to admit the patient to service of [facility].  Will place  him on O2 at night.  Also, will get daily weights.  Strict I's and O's.  Will place a Foley if needed.  Will put him on a heart healthy diet.  Will saline lock him  and get blood work in the morning in order a routine 2-D echo for CHF,  and will place him on DVT and GI prophylaxis.  We will place him on all  of his home medications will consult [facility] to see him.  Will continue to monitor the patient and change any therapy as  necessary.      Dorris Singh, DO  Electronically Signed     CB/MEDQ  D:  10/01/2008  T:  10/02/2008  Job:  [id number]

## 2010-10-05 NOTE — Assessment & Plan Note (Signed)
NAME:  [name] [name], [name] [name]              CHART#:  [id number]   DATE:  11/08/2007                       DOB:  1940-08-11   CHIEF COMPLAINT:  Nausea, vomiting, and diarrhea.   PROBLEM LIST:  1. Acute gastroenteritis with ileus.  2. Symptomatic cholelithiasis, status post cholecystectomy which was      performed by Dr. Lovell Sheehan on 07/16/2007.  3. Chronic GERD with EGD in 2008, which showed a small hiatal hernia,      otherwise normal exam.  4. Last colonoscopy with history of multiple colonic adenoma and left-      sided diverticula was in 2008.  5. He has history of fatty liver.  6. Hypertension.  7. Renal lithiasis.  8. Multiple knee, arm, and shoulder surgeries.  9. Normal gastric emptying study on 02/28/2007  10.Renal insufficiency.  11.Right inguinal herniorrhaphy.  12.Status post H. pylori treatment.   SUBJECTIVE:  The patient is a 70 year old African American male.  He  began to have acute nausea, vomiting and diarrhea which started  approximately 1 week ago.  He began to have epigastric pain.  He started  to take some Pepto, he then had diarrhea which lasted for about 4-5  days.  He was seen in the emergency room.  He had acute abdominal films,  which showed scattered air-fluid levels in nondilated small and large  bowel raising suspicion for ileus.  He had chest x-ray which showed  borderline cardiomegaly, bilateral basilar atelectasis.  He previously  had an MR of the abdomen with and without contrast on 06/06/2007, which  showed small gallstone, this was prior to cholecystectomy.  There was  heterogeneous fatty infiltration of the liver and possible tiny  subcentimeter hemangioma, bilateral renal parapelvic cyst.  He was given  a prescription for Zofran and Lomotil but tells me he has not needed to  use them today.  He tells me his heartburn ingestion is well controlled.  He has been using Pepcid Complete.  He does have significant amount of  abdominal bloating which was much  worse over the weekend.  He denies any  abdominal pain at this point.  He is taking Align once daily.  He denies  any chest pain.  He is not on PPI at this time.   CURRENT MEDICATIONS:  See the list from  11/08/2007.   ALLERGIES:  Aspirin and Nexium.   OBJECTIVE:  VITAL SIGNS:  Weight 316 pounds, height 69 inches,  temperature 98.1, blood pressure 122/84, and pulse 80.  GENERAL:  The patient is a morbidly obese African American male who is  alert, pleasant, cooperative, in no acute distress.  HEENT:  Sclerae clear, nonicteric.  Conjunctivae are pink.  Oropharynx  is pink and moist without lesions.  CHEST:  Heart regular rate and rhythm.  Normal S1 and S2 without  murmurs, clicks, rubs, or gallops.  ABDOMEN:  Positive bowel sounds x4.  No bruits auscultated.  Soft,  nontender, mildly distended without palpable masses or  hepatosplenomegaly.  No rebound, tenderness, or guarding.  EXTREMITIES:  He has 2+ pitting ankle edema bilaterally.   LABORATORY STUDIES:  From 11/04/2007, he had a hemoglobin of 12.9,  hematocrit of 37.6, and platelet count of 138.  He had a sodium of 137,  potassium 3.6, chloride 106, CO2 of 30, glucose 110, BUN 9, creatinine  0.92 and  calcium 8.9.  He had a total bilirubin of 1, alkaline  phosphatase 49, AST 21, ALT 14, total protein 5.8, and albumin 2.9.  He  had negative cardiac markers and a negative urinalysis.   ASSESSMENT:  The patient is a 70 year old African American male with  what appears to be acute gastroenteritis with ileus picture.  He has  improved significantly over the last couple of days.  He does have  chronic GERD and significant bloating and he may benefit from PPI during  this acute bout of gastroenteritis.   PLAN:  1. He is going to need to follow with his primary care Gardner Servantes      regarding his prostatomegaly.  2. Low-sodium diet regarding his lower extremity edema.  3. He is to follow up with Dr. Sherwood Gambler regarding his lower extremity       edema and he agrees with this.  4. He can continue Zofran.  He already has a prescription for 4 mg q.6-      8 hours p.r.n.  5. Increase omeprazole to 20 mg b.i.d. for 2 weeks and then back to      once daily.  6. I would like him to continue Align probiotics.  7. He is to continue with sips of fluids and gradually increase to      [employer]  8. We would check his stool for C. diff.  9. He is to call if he is no better in the next 2-3 days or if his      symptoms worsen, he is to go immediately to the emergency room.      Lorenza Burton, N.P.  Electronically Signed     R. Roetta Sessions, M.D.  Electronically Signed   KJ/MEDQ  D:  11/08/2007  T:  11/09/2007  Job:  [id number]   cc:   Madelin Rear. Sherwood Gambler, MD

## 2010-10-05 NOTE — Assessment & Plan Note (Signed)
NAME:  [name] [name], [name] [name]              CHART#:  [id number]   DATE:  06/05/2008                       DOB:  [birth year]   Followup intermittent diarrhea, upper abdominal pain, and bloating.  Last seen on 11/19/2007.  Gallbladder removed last year for symptomatic  cholelithiasis, chronic gastroesophageal reflux disease with normal EGD  on 06/31/2007, history of colonic polyps due for surveillance in 2011.  Since he was last here, he did have stool studies, which were  unremarkable.  He underwent upper GI small bowel follow-through, which  demonstrated some hypermotility of the small bowel, otherwise no  significant findings.  He was on Align.  He really did not like taking  that.  He does take Activia.  He occasionally has 2-3 loose stools  daily.  He may have a formed bowel movement all day.  He may go a day  without a bowel movement.  He is not passing any blood per rectum.  He  does have some upper abdominal gas from time-to-time, which is relieved  with a bowel movement.  He does take Gas-X occasionally along with  Metamucil.  He rarely takes an Alka-Seltzer.  He is no longer on a PPI.  He does take Pepcid Complete from time-to-time.  He has lost 12 pounds  since his last visit.  Labs from 11/19/2007 looked good with basically  normal CBC, BMET, and hepatic profile.  Amylase and lipase came back  normal.  A normal gastric emptying study back in October 2008.  He is  status post treatment for Helicobacter pylori.   ASSESSMENT:  Intermittent abdominal bloating with periods of diarrhea  but significant periods of normal bowel function to constipation in  between episodes.  He is taking Metamucil daily.  He is not on a regular  probiotic.  He takes Activia sporadically, did not like Librarian, academic.  At this  time, I feel the symptoms are most likely related to irritable bowel  syndrome, and he probably has a component of bile salt redistribution  secondary to cholecystectomy.   RECOMMENDATIONS:  1. We would continue the Metamucil at this time, but I will add      Digestive Advantage for gas bloat/diarrhea 1 capsule daily.  2. Hopefully, over the next several months, his symptoms should settle      down.  It is reassuring as to the significant negative workup that      he has had thus far.  Given his history of colonic adenomas, I will      plan to bring him back in 12 months to assess his progress and set      him up for a surveillance colonoscopy.  Of note, prior CT and MRI      of abdomen demonstrated      heterogeneous liver, most likely related to fatty infiltration, and      __________, without other significant findings.       [name], M.D.  Electronically Signed     RMR/MEDQ  D:  06/05/2008  T:  06/05/2008  Job:  [id number]   cc:   Madelin Rear. Sherwood Gambler, MD

## 2010-10-05 NOTE — H&P (Signed)
NAME:  [name] [name], [name] [name]             ACCOUNT NO.:  [id number]   MEDICAL RECORD NO.:  [id number]          PATIENT TYPE:  AMB   LOCATION:  DAY                           FACILITY:  [facility]   PHYSICIAN:  Dalia Heading, M.D.  DATE OF BIRTH:  [birth year]   DATE OF ADMISSION:  07/16/2007  DATE OF DISCHARGE:  LH                              HISTORY & PHYSICAL   CHIEF COMPLAINT:  Cholecystitis, cholelithiasis.   HISTORY OF PRESENT ILLNESS:  The patient is a 70 year old black male who  is referred for evaluation and treatment of biliary colic secondary to  cholelithiasis.  He has been having intermittent right upper quadrant  abdominal pain with radiation to the flank, nausea, and indigestion for  many months.  It is made worse with fatty foods.  No fever, chills or  jaundice have been noted.   PAST MEDICAL HISTORY:  1. Hypertension.  2. Reflux disease.   PAST SURGICAL HISTORY:  1. Kidney stone removal.  2. Rotator cuff repair.  3. Knee and arm surgeries.   CURRENT MEDICATIONS:  Micardis, furosemide, acetaminophen, metoprolol,  simvastatin, potassium supplements, AcipHex, Travatan.   ALLERGIES:  No known drug allergies.   REVIEW OF SYSTEMS:  The patient denies drinking or smoking.  Denies any  other cardiopulmonary difficulties or bleeding disorders.   PHYSICAL EXAMINATION:  The patient is a mildly obese black male in no  acute distress.  HEENT:  No scleral icterus.  LUNGS:  Clear to auscultation with equal breath sounds bilaterally.  HEART:  A regular rate and rhythm without S3, S4 or murmurs.  ABDOMEN:  Soft, nontender, nondistended.  No hepatosplenomegaly, masses  or hernias are identified.   Ultrasound of the gallbladder reveals cholelithiasis.   IMPRESSION:  Cholecystitis, cholelithiasis.   PLAN:  The patient is scheduled for laparoscopic cholecystectomy on  July 16, 2007.  The risks and benefits of the procedure including  bleeding, infection, hepatobiliary injury and  the possibility of an open  procedure were fully explained to the patient, who gave informed  consent.      Dalia Heading, M.D.  Electronically Signed     MAJ/MEDQ  D:  07/12/2007  T:  07/13/2007  Job:  [id number]   cc:   [facility]  Fax: [phone]   R. Roetta Sessions, M.D.  [address]  [place]  [place] [address]   Madelin Rear. Sherwood Gambler, MD  Fax: [phone]

## 2010-10-08 NOTE — Consult Note (Signed)
[place]. [facility]  Patient:    LEARY, MCNULTY Visit Number: [id number] MRN: [id number]          Service Type: SUR Location: 3000 3010 01 Attending Physician:  Coletta Memos Dictated by:   Oley Balm Sung Amabile, M.D. [facility] Proc. Date: 08/02/01 Admit Date:  08/01/2001 Discharge Date: 08/02/2001   CC:         Ronaldo Miyamoto L. Franky Macho, M.D.  Dr. Sherwood Gambler   Consultation Report  DATE OF BIRTH:  November 17, 1941  REFERRING PHYSICIAN:  Ronaldo Miyamoto L. Franky Macho, M.D.  CHIEF COMPLAINT:  Hypoxemia.  HISTORY OF PRESENT ILLNESS:  Mr. Kooi is a 70 year old gentleman with no prior pulmonary history other than pneumonia a year ago. Since that pneumonia he has had intermittent problems with "chest congestion." He describes phlegm hanging up in his chest. He has no chest pain or fevers with these episodes. Most recently he underwent a course of antibiotic three weeks ago for these symptoms. Since that time he has had minimal cough with no sputum production. He was admitted electively on August 01, 2001, for a right L4-L5 lateral foraminotomy and microdissection. This was performed by Dr. Franky Macho. This was after he presented with progressive numbness and weakness in the right lower extremity. There were no complications during the procedure. This procedure was performed yesterday and the patient was ambulating by this morning. He was noted to have borderline low oxygen saturations on routine oximetry, and consequently, I am asked to evaluate him for such. He does report some increased chest congestion since the procedure. He describes his mucus as yellow. He denies hemoptysis. There are no fevers, chills, and sweats. He denies pleuritic and anginal chest pain. He has chronic lower extremity edema which is not changed from baseline.  PAST MEDICAL HISTORY: 1. Hypertension. 2. Gastroesophageal reflux disease. 3. History of surgeries on his knee, rotator cuff, and left arm. 4. Pneumonia,  one year ago, treated as an outpatient.  SOCIAL HISTORY:  He has smoked minimally, and none in more than two decades. He only smoked cigars prior to that. He has no history of significant occupational or environmental exposures.  FAMILY HISTORY:  This has been reviewed and is noncontributory.  REVIEW OF SYSTEMS:  This is as per the history of present illness. In addition, he reports chronic sinus symptoms with nasal congestion and posterior nasal drainage. These symptoms have been present for approximately 30 years. In addition, he reports a history of heavy snoring and witnessed apneas. He has daytime hypersomnolence. He has been diagnosed with obstructive sleep apnea in the past but was unable to wear CPAP.  PHYSICAL EXAMINATION:  VITAL SIGNS:  Temperature 99.4, blood pressure 130/80, pulse 90 and regular, respirations are 20 and unlabored. Room air oxygen saturation is 90%.  GENERAL:  He is pleasant, in no acute cardiorespiratory distress. At rest he is markedly overweight.  HEENT:  Normal tympanic canals and membranes. Nasal mucosa is not examined. Oropharynx reveals no exudate nor erythema.  NECK:  Supple without adenopathy. Jugular venous pulsations are not well visualized.  CHEST:  Normal percussion note throughout. Breath sounds are full with no wheezes or adventitious sounds.  CARDIAC:  Regular rate and rhythm with no murmurs.  ABDOMEN:  Obese and otherwise, benign.  EXTREMITIES:  Reveals 2+ bilateral symmetric pretibial edema.  NEUROLOGIC:  No gross deficits.  DIAGNOSTIC DATA:  Chest x-ray reveals relatively low lung volumes with scarring and atelectasis in both lower lobes, more prominently on the left than the right.  No definite infiltrates or edema are noted.  IMPRESSION: 1. Mild hypoxic respiratory failure. I have since suspect this is related    to atelectasis, and the atelectasis is due to his obesity as well as    bedridden state for the past 24 hours.  He is able to pull 2 L on the    incentive spirometer now, and I have encouraged to continue to work on    this. He also has a component of bronchitis with purulent sputum.    Therefore, I will prescribe Tequin 400 mg a day for five days. This    course can be completed as an outpatient. I do not hear any wheezing    and do not see any indication for bronchodilator therapy. His body    habitus would suggest the possibility of obesity hypoventilation    syndrome, but bicarbonate level is normal. Therefore, if he does have    chronic alveolar hypoventilation, it is very mild and not a major    contributor to his hypoxemia. 2. History of sleep apnea - his description makes me believe that it is    quite severe. He suffers moderate to severe daytime hypersomnolence.    His inability to tolerate nasal CPAP was probably due to his chronic    nasal congestion and sinus symptoms. I talked to him at length    regarding the diagnosis of sleep apnea, and the fact that I believe    his overall well being would be markedly improved if this were    adequately treated. We discussed the relationship between his body    weight and the condition of sleep apnea. He has plans to undertake    a diet and exercise program after he recovers from the present surgery.    I believe that he would be benefited by a sleep evaluation by Dr. Shelle Iron.    Therefore, I have provided him with the number of our office for him    to make arrangements to see Dr. Shelle Iron for sleep evaluation. 3. I have not scheduled follow up for Mr. Pauls with me. I wuold be    happy to see him at any time in the future as deemed necessary by    his other care providers. Dictated by:   Oley Balm Sung Amabile, M.D. [facility] Attending Physician:  Coletta Memos DD:  08/02/01 TD:  08/03/01 Job: [id number]

## 2010-10-08 NOTE — H&P (Signed)
[place]. [facility]  Patient:    [name], [name] Visit Number: [id number] MRN: [id number]          Service Type: SUR Location: 3000 3010 01 Attending Physician:  Coletta Memos Dictated by:   Mena Goes. Franky Macho, M.D. 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  INDICATIONS:  [name] [name] is a 70 year old gentleman who presented with a six-month history of waxing and waning pain which has progressively gotten worse and deepening numbness in the right lower extremity.  He has been sleeping in a chair for the last two years due to both back pain and left shoulder pain.  The numbness and has been getting much worse over the last few months.  He is also getting very weak in his right lower extremity.  When he climbs stairs, he has to use his left leg more than the right.  Getting in and out of a car is extremely problematic due to the weakness in the right leg; he is much better in getting out on the drivers side where he can plant the left leg and the left lower extremity as opposed to the right side.  He is right-handed and currently supports himself with workmans compensation benefits.  PAST MEDICAL HISTORY:  Past medical history includes hypertension and gastrointestinal problems.  He has had operations on the knee, rotator cuff and left arm.  He has an area of numbness in the ulnar distribution on the left forearm.  ALLERGIES:  No known drug allergies.  SOCIAL HISTORY:  He does not smoke, drink or use illicit drugs.  He is 5-feet 10-inches tall and weighs 302 pounds.  FAMILY HISTORY:  Positive for cancer.  REVIEW OF SYSTEMS:  Positive for tinnitus, nasal congestion, sinus problems, hypertension, hypercholesterolemia, swelling in the feet, leg pain with walking, back pain, arm pain, joint pain, arthritis and neck pain.  He denies constitutional,  eye, respiratory, gastrointestinal, genitourinary, skin, neurological, psychiatric, endocrine, hematologic or allergic problems.  MEDICATIONS:  Micardis, Prevacid, Claritin and Vicodin.    PHYSICAL EXAMINATION:  VITAL SIGNS:  Pulse is 80.  GENERAL:  He is alert and oriented on exam.  NEUROLOGIC:  Pupils are equal, round and reactive to light.  He has full extraocular movements.  He has a normal funduscopic exam, full visual fields and normal.  He has symmetric facial sensation and movement.  Hearing is intact to voice bilaterally.  Uvula elevates in the midline.  Shoulder shrug is normal.  Tongue protrudes in the midline.  He has 5/5 strength in the right upper and left lower extremities; weakness in the right quadriceps, 4/5; great difficulty taking a 14-inch step in the right lower extremity; mild weakness in left grip, 4+/5; decreased sensation in the left and right L5 dermatomes. He has intact proprioception.  Deep tendon reflexes are 2+ at the knees and ankles bilaterally.  Normal muscle tone, bulk and coordination.  NECK:  There are no cervical masses or bruits appreciated.  LUNGS:  Lung fields are clear.  HEART:  Regular rhythm and rate.  No murmurs or rubs.  Pulses are good at the wrists and feet bilaterally.  LABORATORY AND ACCESSORY DATA:  MRI shows foraminal stenosis due to both facet overgrowth and mild disk bulge at L4-5 on the right side.  I certainly cannot identify the nerve root and the neural foramen on the right but on the left side, it is easily seen and surrounded by fat.  Mild spinal stenosis at that level.  He has significant spondylitic change on the right side also.  Conus has a small amount of fat in the ______ , otherwise, normal.  No herniated disk appreciated.  Facet arthropathy is noted at L5-S1 on the right side also.  ASSESSMENT AND PLAN:  Mr. Molder has weakness in the right lower extremity due to quadriceps involvement due to an L4  radiculopathy.  I believe he needs surgery versus conservative treatment because of weakness that is rather profound and I do not anticipate this improving secondary to the foraminal stenosis.  Risks of the procedure including bleeding, infection, no pain relief, need for further surgery, bowel and bladder dysfunction and cerebrospinal fluid leaks were discussed.  He understands and would like to proceed with foraminotomy entailing a facetectomy and possible diskectomy.  He is admitted for that reason. Dictated by:   Mena Goes. Franky Macho, M.D. Attending Physician:  Coletta Memos DD:  08/01/01 TD:  08/02/01 Job: [id number]

## 2010-10-08 NOTE — H&P (Signed)
NAME:  [name] [name], [name] [name]             ACCOUNT NO.:  [id number]   MEDICAL RECORD NO.:  [id number]         PATIENT TYPE:  AMB   LOCATION:                                FACILITY:  [facility]   PHYSICIAN:  R. Roetta Sessions, M.D. DATE OF BIRTH:  Jan 05, 1941   DATE OF ADMISSION:  DATE OF DISCHARGE:  LH                              HISTORY & PHYSICAL   CHIEF COMPLAINT:  Needs screening colonoscopy/refractory indigestion and  heartburn.   HISTORY OF PRESENT ILLNESS:  [name] [name] is a 70 year old male with a  three month history of worsening epigastric heartburn, indigestion, and  nausea.  He has a history of chronic GERD, had been off PPI.  Approximately a month ago, he began Prilosec 20 mg daily, then b.i.d.  He states he had much better coverage of his symptoms with Nexium;  however, he does not have insurance coverage.  He does have nausea but  denies any vomiting.  He has intermittent bouts of loose stools a couple  of days a week with up to 2 or 3 loose, nonbloody stools.  Denies any  problems with constipation, rectal bleeding, or melena.   PAST MEDICAL HISTORY:  Chronic GERD, hypertension, renal insufficiency,  sleep apnea, colonoscopy many, many years ago, left rotator cuff, back  surgery, right inguinal hernia repair, left knee arthroscopy.  He has a  history of H. Pylori, status post prev pack.  His last EGD was March 09, 2001 by Dr. Jena Gauss.  He was found to have erosive esophagitis, a  small hiatal hernia.   CURRENT MEDICATIONS:  1. Lasix 20 mg t.i.d. p.r.n.  2. Prilosec 20 mg daily.  3. Advil p.r.n.  4. Ibuprofen p.r.n.  5. Potassium citrate 1080 1/2 p.o. b.i.d.  6. Fiber once or twice a week.   ALLERGIES:  ASPIRIN.   FAMILY HISTORY:  Positive for father diagnosed with colon cancer in his  mid 54s, deceased at age 69.  Mother has a history of pancreatic  carcinoma, deceased in her 40s.  He has four siblings, one with diabetes  mellitus.   SOCIAL HISTORY:  [name] [name] is  married.  He has five relatively healthy  children.  He is disabled.  He has a remote history of tobacco use.  Denies alcohol or drug use.   REVIEW OF SYSTEMS:  CONSTITUTIONAL:  His weight is stable.  He denies  any fevers or chills.  CARDIOVASCULAR:  He denies any chest pain or  palpitations.  PULMONARY:  No shortness of breath, dyspnea, cough,  hemoptysis.  GI:  See HPI.   PHYSICAL EXAMINATION:  VITAL SIGNS:  Weight 307.  Height 69.  Temp 98.1,  blood pressure 147/94, pulse 68.  GENERAL:  [name] [name] is an obese African-American male who is alert,  pleasant, and cooperative in no acute distress.  HEENT:  __________.  Oropharynx pink and moist without any lesions.  NECK:  Supple with no masses or thyromegaly.  CHEST:  Clear.  HEART:  Regular rate and rhythm with normal S1 and S2.  ABDOMEN:  Protuberant with positive bowel sounds x4.  No bruits  auscultated.  Soft, nontender, nondistended without palpable mass or  hepatosplenomegaly.  No evidence of tenderness or guarding.  Exam is  limiting because of the patient's body habitus.  RECTAL:  Deferred.  EXTREMITIES:  He had 2+ lower extremity pretibial edema bilaterally.  No  clubbing.  SKIN:  Brown, warm, and dry without any rash or jaundice.   IMPRESSION:  [name] [name] is a 70 year old obese male with a history of  chronic gastroesophageal reflux disease/erosive reflux esophagitis, who  was off PPI.  About three months ago, he developed epigastric burning,  heartburn, indigestion, and nausea.  He started Prilosec, which did not  provide complete relief.  He had much better symptoms of control on  Nexium, unfortunately, he was unable to afford this medication.  Given  his refractory gastroesophageal reflux disease, I suspect we may be  dealing with erosive reflux esophagitis/gastritis.  He also has a family  history of colon cancer and is requesting screening colonoscopy at this  time.   PLAN:  1. Resume Nexium 40 mg daily.  I have  given him a month's worth of      samples and a prescription for 30 with 11 refills and a rebate      card.  2. He is going to call if he does not get complete relief with Nexium.      At that point, we will proceed with EGD.  3. Screening colonoscopy with Dr. Jena Gauss in the near future.  I have      discussed the procedure, including risks and benefits, including      but not limited to bleeding, infection, perforation, and drug      reaction.  He agrees, and informed consent will be obtained.      Nicholas Lose, N.P.      [name], M.D.  Electronically Signed    KC/MEDQ  D:  05/09/2006  T:  05/09/2006  Job:  [id number]   cc:   Madelin Rear. Sherwood Gambler, MD  Fax: [phone]

## 2010-10-08 NOTE — Discharge Summary (Signed)
[place]. [facility]  Patient:    [name] [name], [name] [name] Visit Number: [id number] MRN: [id number]          Service Type: SUR Location: 3000 3010 01 Attending Physician:  Coletta Memos Dictated by:   Mena Goes. Franky Macho, M.D. Admit Date:  08/01/2001 Discharge Date: 08/02/2001                             Discharge Summary  ADMISSION DIAGNOSIS:  Right L4 radiculopathy.  DISCHARGE DIAGNOSES: 1. Right L4 radiculopathy. 2. Renal insufficiency. 3. Bronchitis. 4. Sleep apnea.  HISTORY OF PRESENT ILLNESS:  [name] [name] is a 70 year old gentleman who presented with a right L4 radiculopathy secondary to foraminal compression at L4-5 due to a facet arthropathy.  He was taken to the operating room and had an L4-5 far lateral foraminotomy.  HOSPITAL COURSE:  Postoperatively, he was doing well, but did exhibit hypoxemia.  A pulmonary consult was obtained and they recommended that he receive Tequin for five days 400 mg secondary to bronchitis, that he undergo a sleep apnea study, and that postoperative hypoxemia was probably due to atelectasis and would improve with mobilization and an incentive spirometer. The patient will therefore be discharged home.  His wound was clean and dry and without signs of infection.  He has weakness in the right L4 muscles and quadriceps in the leg which was present preoperatively.  His leg feels much better.  He is tolerating a regular diet.  DISPOSITION:  He will be discharged home.  ACTIVITY:  He was given discharge instructions of no driving for 10 days.  FOLLOW-UP:  Return appointment to see me in three to four weeks. Dictated by:   Mena Goes. Franky Macho, M.D. Attending Physician:  Coletta Memos DD:  08/02/01 TD:  08/04/01 Job: [id number]

## 2010-10-08 NOTE — Op Note (Signed)
[place]  Patient:    [name] [name], [name] [name] Visit Number: [id number] MRN: [id number]          Service Type: SUR Location: [id number] Attending Physician:  Coletta Memos Dictated by:   Mena Goes. Franky Macho, M.D. Proc. Date: 08/01/01 Admit Date:  08/01/2001                             Operative Report  ADMITTING DIAGNOSES: 1. Right L4 radiculopathy. 2. 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  POSTOPERATIVE DIAGNOSES: 1. Right L4 radiculopathy. 2. 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  PROCEDURE:  Right far lateral facetectomy and foraminotomy at L4-5 with microdissection.  COMPLICATIONS:  None.  SURGEON:  Kyle L. Franky Macho, M.D.  ASSISTANT:  Hewitt Shorts, M.D.  INDICATIONS:  The patient is a 70 year old gentleman with right L4-5 foraminal stenosis.  He has weakness in the quadriceps, decreased pinprick sensation in the right L4 dermatome.  I recommended due to the weakness and the clear-cut foraminal stenosis, that he undergo operative decompression of the nerve root.  DESCRIPTION OF PROCEDURE:  The patient was brought to the operating room intubated and then placed under general anesthesia without difficulty.  He was rolled prone on to a Wilson frame, and all pressure points were properly padded.  A preoperative localizing x-ray was performed, and it showed that the needle that I had placed was pointing at the L4-5 interspinous space.  I therefore prepped and infiltrated 20 cc of 0.5% lidocaine into the paraspinous tissue and subcutaneously for my proposed incision site.  I made the incision with a #10 blade and I took this down through the significant amount of subcutaneous fat until I reached the thoracolumbar fascia.  I controlled bleeding during the dissection with monopolar and bipolar cautery.  I placed self-retaining cerebellar retractor and then exposed the lamina of what I believe to be L4 and L3.  I dissected out over the L4-5 facet and  over the L3-4 facet, and identified the pars of L4.  I took another x-ray to confirm that I was working at the L4 lamina and pars, and it actually showed that I was at the correct location.  I then brought the microscope into the field and this aided in microdissection when I used the high speed air drill to drill away the pars and the facet which was encroaching upon the neuroforamen at L4-5.  I removed a significant amount of synovial tissue which came out during the dissection, and also removed some fat.  I then was able to clearly identify the nerve root.  Dr. Newell Coral assisted with the decompression which was done with both the drill, but mainly with Kerrison punches.  I inspected the disk space very carefully and felt that there was no significant compression of the nerve by the disk, and that most of the compression had been due to the facet.  I therefore was satisfied with the decompression of the nerve root after performing the facetectomy.  I inspected the neural foramen proximally and distally, and felt that there was no obvious compression of the L4 nerve root.  I then irrigated the wound.  I then removed the microscope.  I placed 1 cc of Depo-Medrol on to the nerve.  I then closed the wound in a layered fashion using Vicryl sutures and reapproximating subcutaneous tissue with Vicryl sutures.  Dermabond was used for a sterile dressing. Dictated by:   Mena Goes. Franky Macho, M.D.  Attending Physician:  Coletta Memos DD:  08/01/01 TD:  08/02/01 Job: [id number]

## 2010-10-08 NOTE — Op Note (Signed)
NAME:  [name] [name], [name] [name]             ACCOUNT NO.:  [id number]   MEDICAL RECORD NO.:  [id number]          PATIENT TYPE:  AMB   LOCATION:  DAY                           FACILITY:  [facility]   PHYSICIAN:  R. Roetta Sessions, M.D. DATE OF BIRTH:  1941/05/06   DATE OF PROCEDURE:  05/22/2006  DATE OF DISCHARGE:                               OPERATIVE REPORT   Esophagogastroduodenoscopy, colonoscopy with ileoscopy and snare  polypectomy.   INDICATIONS FOR PROCEDURE:  The patient is a 70 year old African-  American male with no lower GI tract symptoms who is set up now for a  high-risk screening colonoscopy.  It has been many years since he has  had one.  Positive family history of colon cancer in his father  diagnosed in his mid 42s.  He has had significant trouble with reflux  symptoms recently.  He was switched from Prilosec to Nexium with  significant improvement in his symptoms.  He is taking 40 mg daily.  Concerns have been raised about the possibility of complicated  gastroesophageal reflux disease. Therefore, EGD and colonoscopy are now  being done.  This approach has been discussed with the patient in some  detail.  The potential risks, benefits and alternatives have been  reviewed, questions answered.  He is agreeable.  Please see  documentation in the medical record.   PROCEDURE NOTE:  O2 saturation, blood pressure, pulse and respirations  were monitored throughout the entirety of both procedures.  Conscious  sedation for both procedures Versed 4 mg IV. Demerol 50 mg IV.  Cetacaine spray for topical pharyngeal anesthesia.   INSTRUMENT:  Pentax video chip system.   FINDINGS:  Esophagogastroduodenoscopy:  Examination of the esophagus  revealed no mucosal abnormalities.  EG junction easily traversed.  Stomach:  The gastric cavity was emptied and insufflated well with air.  Thorough examination of the gastric mucosa including retroflexed view of  the proximal stomach and esophagogastric  junction demonstrated only a  small hiatal hernia.  Pylorus was patent, easily traversed.  Examination  of bulb and second portion revealed no abnormalities.   THERAPEUTIC/DIAGNOSTIC MANEUVERS PERFORMED:  None.   The patient tolerated the procedure well and was prepared for  colonoscopy.  Digital rectal exam revealed mild anal stenosis, otherwise  negative.   ENDOSCOPIC FINDINGS:  The prep was adequate.   Colon:  Colonic mucosa was surveyed from the rectosigmoid junction  through the left, transverse, and right colon to the area of the  appendiceal orifice, ileocecal valve and cecum.  These structures were  well seen and photographed for the record.  The terminal ileum was  intubated to 10 cm.  From this level, the scope was slowly withdrawn.  All previously mentioned mucosal surfaces were again seen.  The patient  has scattered sigmoid diverticula.  The patient had multiple 4- to 6-mm  pedunculated polyps about the hepatic flexure and in the distal  ascending colon.  The largest polyp was found to be a 1-cm polyp just  distal to the ileocecal valve.  Multiple hot snare polypectomies were  performed.  Multiple polyp fragments were obtained.  The largest  polyp  was cleanly resected but was not recovered intact.  It may have broken  up in the processing, but multiple polyps were recovered for the  pathologist.  The scope was pulled down into the rectum where a thorough  examination of the rectal mucosa including retroflexed view of the anal  verge was undertaken and rectal mucosa appeared normal.  The patient  tolerated both procedures well and was reactive to endoscopy.   IMPRESSION:  Esophagogastroduodenoscopy:  Normal esophagus, small hiatal  hernia, otherwise normal stomach.  D1/D2 appeared normal.   Colonoscopy findings:  Normal rectum, left-sided diverticula.  Multiple  right colon polyps resected as described above.  Left-sided diverticula.  The remainder of the colonic mucosa  appeared normal.   RECOMMENDATION:  1. No aspirin or arthritis medications for 10 days.  2. Follow-up on pathology.  3. Continue Nexium 40 mg orally daily.  4. Further recommendations to follow.      [name], M.D.  Electronically Signed     RMR/MEDQ  D:  05/22/2006  T:  05/22/2006  Job:  [id number]   cc:   Madelin Rear. Sherwood Gambler, MD  Fax: [phone]

## 2010-11-15 ENCOUNTER — Encounter: Payer: Self-pay | Admitting: Gastroenterology

## 2010-11-15 ENCOUNTER — Other Ambulatory Visit: Payer: Self-pay | Admitting: Gastroenterology

## 2010-11-15 ENCOUNTER — Ambulatory Visit (INDEPENDENT_AMBULATORY_CARE_PROVIDER_SITE_OTHER): Payer: Medicare Other | Admitting: Gastroenterology

## 2010-11-15 VITALS — BP 146/83 | HR 85 | Temp 98.2°F | Ht 66.0 in | Wt 302.0 lb

## 2010-11-15 DIAGNOSIS — K625 Hemorrhage of anus and rectum: Secondary | ICD-10-CM

## 2010-11-15 DIAGNOSIS — R109 Unspecified abdominal pain: Secondary | ICD-10-CM

## 2010-11-15 LAB — CBC WITH DIFFERENTIAL/PLATELET
Basophils Absolute: 0 10*3/uL (ref 0.0–0.1)
Basophils Relative: 0 % (ref 0–1)
Eosinophils Relative: 1 % (ref 0–5)
HCT: 45.3 % (ref 39.0–52.0)
MCHC: 32.2 g/dL (ref 30.0–36.0)
Monocytes Absolute: 1.2 10*3/uL — ABNORMAL HIGH (ref 0.1–1.0)
Neutro Abs: 12.3 10*3/uL — ABNORMAL HIGH (ref 1.7–7.7)
Platelets: 150 10*3/uL (ref 150–400)
RDW: 17.2 % — ABNORMAL HIGH (ref 11.5–15.5)
WBC: 14.7 10*3/uL — ABNORMAL HIGH (ref 4.0–10.5)

## 2010-11-15 MED ORDER — HYDROCORTISONE ACETATE 25 MG RE SUPP: 25.0000 mg | RECTAL | Status: AC

## 2010-11-15 NOTE — Patient Instructions (Signed)
Have labs done as soon as possible. We will call you with these results.  Start taking Dexilant 60 mg daily. AVOID all medications such as ibuprofen, advil, aleve, BC powders or Goodys.   Start taking Anusol suppositories in the evening for 10 days.  Follow a low-residue diet to help rest your colon. See the handout provided.  After review of your labs, we will decide the next course of action. If you have large amounts of bright red blood, severe abdominal pain, severe nausea or vomiting, seek medical attention. We will be in touch shortly.

## 2010-11-15 NOTE — Progress Notes (Signed)
Referring Provider: Cassell Smiles., MD Primary Care Physician:  Cassell Smiles., MD  Chief Complaint  Patient presents with  . Follow-up  . Rectal Bleeding    HPI:   [name] [name] is a pleasant, well-known 70 year old male who presents today in routine follow-up. He has undergone a thorough investigation in the last few months while hospitalized due to GI bleed. Please see PMH for details. Capsule study showed possible transient focal ischemia. At his last visit in March, I actually saw him. He was doing well without any further rectal bleeding or abdominal pain. He had complaints of gas, otherwise was doing well. Was started on probiotics.   Today, he appears somewhat down and concerned. Reports intermittent rectal bleeding, paper hematochezia as well as occasional "gushes" of blood with BM for past 4-5 days. He stopped taking the probiotic due to runny stools. This has since resolved. Denies diarrhea. Complains of nausea. +epigastric pain, 8/10, not associated with eating or drinking. No hematemesis. No vomiting. BM twice/day. No fever or chills. States he just feels bad.   Most recent CT in March 2012 without any abnormality, + fatty liver.   Past Medical History  Diagnosis Date  . CHF (congestive heart failure)   . Hypertension   . Obesity   . Hyperlipidemia   . PUD (peptic ulcer disease)   . GERD (gastroesophageal reflux disease)   . S/P endoscopy 2007, 2012    2007: nl, May 2012: antral erosions  . Osteoarthritis   . S/P colonoscopy 2007, Feb and May 2012    hx of adenomas, left-sided diverticula,     Past Surgical History  Procedure Date  . Knee arthroscopy   . Rotator cuff repair   . Cholecystectomy   . Lumbar spine surgery     Current Outpatient Prescriptions  Medication Sig Dispense Refill  . diclofenac (VOLTAREN) 0.1 % ophthalmic solution 1 drop 4 (four) times daily.        . diclofenac sodium (VOLTAREN) 1 % GEL Apply 1 application topically 4 (four) times  daily.        . dorzolamide (TRUSOPT) 2 % ophthalmic solution Place 1 drop into both eyes 3 (three) times daily.        . furosemide (LASIX) 80 MG tablet Take 80 mg by mouth 2 (two) times daily.             Marland Kitchen levothyroxine (SYNTHROID, LEVOTHROID) 25 MCG tablet Take 25 mcg by mouth daily.        . metoprolol succinate (TOPROL-XL) 25 MG 24 hr tablet Take 25 mg by mouth daily.        [name] (SYSTANE) 0.4-0.3 % SOLN Apply 1 drop to eye daily.       . Tamsulosin HCl (FLOMAX) 0.4 MG CAPS Take 0.4 mg by mouth daily after supper.        . travoprost, benzalkonium, (TRAVATAN) 0.004 % ophthalmic solution Place 1 drop into both eyes at bedtime.        . simvastatin (ZOCOR) 40 MG tablet Take 40 mg by mouth at bedtime.           Allergies as of 11/15/2010 - Review Complete 11/15/2010  Allergen Reaction Noted  . Aspirin      Family History  Problem Relation Age of Onset  . Colon cancer Father 57    deceased  . Prostate cancer Father   . Pancreatic cancer Mother 72    deceased    History   Social  History  . Marital Status: Married    Spouse Name: N/A    Number of Children: N/A  . Years of Education: N/A   Social History Main Topics  . Smoking status: Former Smoker    Types: Cigars    Quit date: 08/10/1973  . Smokeless tobacco: Never Used  . Alcohol Use: No  . Drug Use: No  . Sexually Active: None     Review of Systems: Gen: Denies fever, chills, anorexia. Denies fatigue, weakness, weight loss.  CV: Denies chest pain, palpitations, syncope, peripheral edema, and claudication. Resp: Denies dyspnea at rest, cough, wheezing, coughing up blood, and pleurisy. GI: Denies vomiting blood, jaundice, and fecal incontinence.   Denies dysphagia or odynophagia. See HPI Derm: Denies rash, itching, dry skin Psych: Denies depression, anxiety, memory loss, confusion. No homicidal or suicidal ideation.  Heme: Denies bruising, bleeding, and enlarged lymph  nodes.  Physical Exam: BP 146/83  Pulse 85  Temp(Src) 98.2 F (36.8 C) (Temporal)  Ht 5\' 6"  (1.676 m)  Wt 302 lb (136.986 kg)  BMI 48.74 kg/m2 General:   Alert and oriented. No distress noted. Depressed acting due to illness.  Head:  Normocephalic and atraumatic. Eyes:  Conjuctiva clear without scleral icterus. Mouth:  Oral mucosa pink and moist. Good dentition. No lesions. Neck:  Supple, without mass or thyromegaly. Heart:  S1, S2 present without murmurs, rubs, or gallops. Regular rate and rhythm. Abdomen:  +BS, largely obese, rectus diastasis.  No rebound or guarding. No HSM or masses noted. Rectal: hemorrhoidal tags, no thrombosis, difficult to complete internal exam; pt rigid and tense.  Extremities:  1+ edema bilaterally Neurologic:  Alert and  oriented x4;  grossly normal neurologically. Skin:  Intact without significant lesions or rashes. Cervical Nodes:  No significant cervical adenopathy. Psych:  Alert and cooperative. Normal mood and affect.

## 2010-11-16 ENCOUNTER — Telehealth: Payer: Self-pay | Admitting: Gastroenterology

## 2010-11-16 DIAGNOSIS — K625 Hemorrhage of anus and rectum: Secondary | ICD-10-CM | POA: Insufficient documentation

## 2010-11-16 LAB — LIPASE: Lipase: 36 U/L (ref 0–75)

## 2010-11-16 LAB — HEPATIC FUNCTION PANEL
ALT: 12 U/L (ref 0–53)
AST: 14 U/L (ref 0–37)
Bilirubin, Direct: 0.2 mg/dL (ref 0.0–0.3)
Total Protein: 5.9 g/dL — ABNORMAL LOW (ref 6.0–8.3)

## 2010-11-16 NOTE — Assessment & Plan Note (Signed)
Recent EGD on file. Antral erosions. Nausea and epigastric pain, 8/10, not associated with eating/drinking. No vomiting. Will switch PPI. Question other etiology; will order CBC, HFP, lipase. Further recommendations to follow. Gastroparesis a very low likelihood, especially in setting of abdominal pain. However, if continued nausea without pain would consider GES.   Dexilant trial Labs Further recommendations to follow

## 2010-11-16 NOTE — Telephone Encounter (Signed)
Received labs, CBC with leukocytosis, no anemia. Attempted to call pt; had to leave message.  If pt returns call, please find out status. Any further bleeding? Any abdominal pain? Any changes, improvement, or worsening of condition.  Will likely need further investigation as to ongoing rectal bleeding despite thorough work-up thus far to include EGD/colonoscopy, and small bowel study. Givens showed possible transient focal ischemia. Question of ongoing ischemic colitis. Has had recent CT. Will need radiology to comment on the mesenteric vasculature.

## 2010-11-16 NOTE — Assessment & Plan Note (Addendum)
70 year old male with recent EGD, colonoscopy, and small bowel study on file. Question of transient focal ischemia on small bowel imaging. Rectal bleeding intermittent and varies; sometimes paper hematochezia, sometimes gush. Concern for intermittent episodes of ischemia; in light of pt's co-morbidities, can't be entirely ruled out. No significant change in bowel habits. Will likely need further studies to determine source. In interim, will provide anusol suppositories, follow low-residue diet, CBC stat. Will contact pt with results and further work-up. (?source of GI bleed?)  Addendum: Discussed with Dr. Tyron Russell most recent CT scan in March. Reviewed mesenteric vasculature. All widely patent, very little plaque noted.  Will discuss with Dr. Darrick Penna further work-up for source of continued intermittent bleeding.

## 2010-11-17 NOTE — Telephone Encounter (Signed)
Spoke with Dr. Tyron Russell regarding CT in March. Vasculature patent. Very mild plaque. Need update on pt. Unable to reach at this time.

## 2010-11-17 NOTE — Telephone Encounter (Signed)
Pt stated he is feeling good now. Had some nausea this am but its better now.

## 2010-11-18 NOTE — Progress Notes (Signed)
Cc to PCP 

## 2010-11-19 NOTE — Progress Notes (Signed)
Agree with labs. Need records reviewed by RMR.

## 2010-11-22 NOTE — Telephone Encounter (Signed)
Will discuss further with Dr. Jena Gauss; pt may need DBE. To discuss upon return.

## 2011-01-25 ENCOUNTER — Encounter (HOSPITAL_COMMUNITY): Payer: Self-pay | Admitting: Oncology

## 2011-01-25 ENCOUNTER — Encounter (HOSPITAL_COMMUNITY): Payer: Medicare Other | Attending: Oncology | Admitting: Oncology

## 2011-01-25 VITALS — BP 124/78 | HR 77 | Temp 97.3°F | Wt 309.0 lb

## 2011-01-25 DIAGNOSIS — R197 Diarrhea, unspecified: Secondary | ICD-10-CM | POA: Insufficient documentation

## 2011-01-25 DIAGNOSIS — K7689 Other specified diseases of liver: Secondary | ICD-10-CM | POA: Insufficient documentation

## 2011-01-25 DIAGNOSIS — Z79899 Other long term (current) drug therapy: Secondary | ICD-10-CM | POA: Insufficient documentation

## 2011-01-25 DIAGNOSIS — D696 Thrombocytopenia, unspecified: Secondary | ICD-10-CM | POA: Insufficient documentation

## 2011-01-25 LAB — CBC
MCH: 26.5 pg (ref 26.0–34.0)
MCHC: 32.2 g/dL (ref 30.0–36.0)
Platelets: 139 10*3/uL — ABNORMAL LOW (ref 150–400)
RBC: 5.17 MIL/uL (ref 4.22–5.81)

## 2011-01-25 NOTE — Progress Notes (Signed)
This office note has been dictated.

## 2011-01-25 NOTE — Patient Instructions (Signed)
[facility]  Discharge Instructions  RECOMMENDATIONS MADE BY THE CONSULTANT AND ANY TEST RESULTS WILL BE SENT TO YOUR REFERRING DOCTOR.   EXAM FINDINGS BY MD TODAY AND SIGNS AND SYMPTOMS TO REPORT TO CLINIC OR PRIMARY MD:   Labwork in 4 months then return to Dr. Mariel Sleet in 4 months.   Ultrasound of the spleen on Friday.    I acknowledge that I have been informed and understand all the instructions given to me and received a copy. I do not have any more questions at this time, but understand that I may call the [facility]ty [facility] at [facility] at [phone] during business hours should I have any further questions or need assistance in obtaining follow-up care.    __________________________________________  _____________  __________ Signature of Patient or Authorized Representative            Date                   Time    __________________________________________ Nurse's Signature

## 2011-01-26 LAB — PATHOLOGIST SMEAR REVIEW

## 2011-01-26 LAB — HIV ANTIBODY (ROUTINE TESTING W REFLEX): HIV: NONREACTIVE

## 2011-01-28 ENCOUNTER — Ambulatory Visit (HOSPITAL_COMMUNITY)
Admission: RE | Admit: 2011-01-28 | Discharge: 2011-01-28 | Disposition: A | Discharge status: Home / Self Care | Payer: Medicare Other | Source: Ambulatory Visit | Attending: Oncology | Admitting: Oncology

## 2011-01-28 DIAGNOSIS — D696 Thrombocytopenia, unspecified: Secondary | ICD-10-CM | POA: Insufficient documentation

## 2011-01-28 DIAGNOSIS — K7689 Other specified diseases of liver: Secondary | ICD-10-CM | POA: Insufficient documentation

## 2011-02-11 LAB — BASIC METABOLIC PANEL
BUN: 10
Calcium: 9.5
GFR calc non Af Amer: >60
Glucose, Bld: 79

## 2011-02-11 LAB — HEPATIC FUNCTION PANEL
AST: 14
Albumin: 3.2 — ABNORMAL LOW
Total Protein: 5.8 — ABNORMAL LOW

## 2011-02-11 LAB — CBC
Platelets: 154
RDW: 15.6 — ABNORMAL HIGH

## 2011-02-17 LAB — HEPATIC FUNCTION PANEL
ALT: 14
AST: 21
Bilirubin, Direct: 0.2
Total Bilirubin: 1

## 2011-02-17 LAB — DIFFERENTIAL
Basophils Relative: 0
Monocytes Absolute: 1
Monocytes Relative: 13 — ABNORMAL HIGH
Neutro Abs: 5.7

## 2011-02-17 LAB — POCT CARDIAC MARKERS: Myoglobin, poc: 59.6

## 2011-02-17 LAB — CBC
HCT: 37.6 — ABNORMAL LOW
Hemoglobin: 12.9 — ABNORMAL LOW
MCHC: 34.4
RBC: 4.66

## 2011-02-17 LAB — BASIC METABOLIC PANEL
CO2: 30
Calcium: 8.9
Chloride: 102
GFR calc Af Amer: >60
Potassium: 3.6
Sodium: 137

## 2011-02-17 LAB — URINALYSIS, ROUTINE W REFLEX MICROSCOPIC
Glucose, UA: NEGATIVE
Hgb urine dipstick: NEGATIVE
Specific Gravity, Urine: 1.01

## 2011-03-04 NOTE — Progress Notes (Signed)
CC:   Madelin Rear. Sherwood Gambler, MD R. Roetta Sessions, MD [facility] Dr. __________  DIAGNOSES: 1. Mild thrombocytopenia. 2. Obesity. 3. Fatty infiltration of the liver. 4. Hypertension. 5. Hypercholesterolemia.  HISTORY:  This gentleman is 70 years old.  He is disabled, he states, since his early 59s from back problems and left rotator cuff injury while on the job.  He worked and was injured at the [employer] here in [place] which was under a different name at the time he states.  He has also BPH and is on Flomax.  He thinks he has a history of CHF.  I do not have perfect documentation of that and he has some chronic swelling of his legs with some numbness in his feet at times.  His workup thus far by Dr. Sherwood Gambler, after finding platelets of around 124,000 to 130,000, was inclusive of B12 levels, folic acid levels and liver enzymes evaluation as well as iron stores, all of which were really unremarkable.  This gentleman actually had a CT in March when he was in the emergency room and was found to have enlargement of his liver plus fatty infiltration.  On one view, namely of coronal view, it looks as if his spleen is enlarged at that time and on the cross sectional views, however, it does not necessarily appear to be significant in size.  There is no associated adenopathy, etc.  PAST HISTORY:  Remarkable for cholecystectomy.  FAMILY HISTORY:  His father is deceased from colon cancer.  Mother deceased from pancreatic cancer.  He has been married for many years. He and his wife live together.  She, though, is not in good health he states.  He has always been overweight.  In fact, he states he does not remember a time when he was normal weight.  His height is not recorded today.  His weight is 309 pounds.  REVIEW OF SYSTEMS:  Unremarkable.  He has had extra-marital issues, he states, 15-20 years ago but has never had an HIV test which we will do today.  He used to chew  or smoke cigars in the past, basically chewing them, he states.  He did smoke cigarettes.  He does not use alcohol.  MEDICATIONS:  In the chart.  OTHER MEDICAL PROBLEMS:  Hypothyroidism.  He is on Synthroid, just very low dose 25 mcg once a day.  He also has GERD and he is on Prevacid.  He had high cholesterol and was on Zocor at one time.  He is not taking that presently.  PHYSICAL EXAM:  General:  Otherwise very overweight individual, no acute distress.  He appears to be less than 6 feet tall.  He has no obvious lymphadenopathy.  He has no obvious hepatosplenomegaly.  His lungs are clear to auscultation and percussion.  Heart:  Shows a regular rhythm and rate without obvious murmur, rub or gallop.  He has very fatty breasts.  He has 1+ pitting edema of the pretibial and feet areas. Pulses are 1+ and symmetrical.  He is right-handed.  Abdomen:  Obese. Bowel sounds are diminished.  He has no adenopathy.  No obvious thyromegaly.  Many teeth are missing.  What few teeth remain are in fair repair only.  Tongue is normal in the midline.  Pupils appear to be equally round and reactive to light.  This gentleman has thrombocytopenia which I suspect is due to early splenomegaly from I suspect fatty infiltration/early cirrhosis of the liver.  I will check an  HIV and a splenic ultrasound and tentatively just see him back in 4 months because at times his platelets are indeed in the normal range, such as on 08/11/2010 his platelets were 156,000, on 11/15/2010 they were 150,000, but prior to that in March they were around 135,000.  They have been as low as 109,000 as of 07/23/2010 but that was during this admission, he states, for abdominal pain.  So we will see him back.  I do not think there is anything to do otherwise right now other than what I have mentioned.  We will see him sooner if need be.    ______________________________ Ladona Horns. Mariel Sleet, MD ESN/MEDQ  D:  01/25/2011  T:   01/25/2011  Job:  [id number]

## 2011-04-20 ENCOUNTER — Encounter: Payer: Self-pay | Admitting: Gastroenterology

## 2011-04-20 ENCOUNTER — Ambulatory Visit (INDEPENDENT_AMBULATORY_CARE_PROVIDER_SITE_OTHER): Payer: Medicare Other | Admitting: Gastroenterology

## 2011-04-20 VITALS — BP 139/88 | HR 89 | Temp 98.2°F | Ht 66.0 in | Wt 303.2 lb

## 2011-04-20 DIAGNOSIS — R110 Nausea: Secondary | ICD-10-CM

## 2011-04-20 DIAGNOSIS — K5900 Constipation, unspecified: Secondary | ICD-10-CM

## 2011-04-20 DIAGNOSIS — R109 Unspecified abdominal pain: Secondary | ICD-10-CM

## 2011-04-20 NOTE — Assessment & Plan Note (Signed)
?   R/t gastritis. Consider GES if no improvement after PPI. Last GES in 2008 normal

## 2011-04-20 NOTE — Assessment & Plan Note (Signed)
Intermittent constipation, recent colonoscopy on file. Will institute miralax as needed. High fiber diet. 3 mos f/u.

## 2011-04-20 NOTE — Patient Instructions (Signed)
Start taking Prilosec each morning, 30 minutes before breakfast. You have been given samples to start. Please call us in about 10 days with a report on how you are doing. Avoid Voltaren if possible.  Follow a high fiber diet. Please see handout.  You may take Miralax (this is over the counter), as needed for a bowel movement daily. Hold if diarrhea.   We will see you back in 3 months or sooner if needed!

## 2011-04-20 NOTE — Progress Notes (Signed)
Referring Provider: Cassell Smiles., MD Primary Care Physician:  Cassell Smiles., MD Primary Gastroenterologist: Dr. Jena Gauss   Chief Complaint  Patient presents with  . Nausea  . Constipation    HPI:   [name] [name] returns today in routine follow-up; for a full, detailed history please see last OV note. Reports nausea intermittently, especially when waking, for last 2-3 weeks. Epigastric pain, intermittent, like knives, dull; feels like if he could throw up, it would be better. Not aggravated with eating/drinking. States appetite is "shot". Had acute bout with diarrhea last Monday, associated with nausea and diaphoresis. Lasted a few days. Resolved now. No evidence of melena or hematochezia. Intermittent constipation since we have seen him last; it is better since starting stool softeners. Has been on Voltaren for several weeks. Goes tomorrow to talk with doctor who prescribed this. HAS NOT BEEN ON PPI for 3-4 months.   2008: normal gastric emptying.  Wt stable, actually up 1 lb.   Past Medical History  Diagnosis Date  . CHF (congestive heart failure)   . Hypertension   . Obesity   . Hyperlipidemia   . PUD (peptic ulcer disease)   . GERD (gastroesophageal reflux disease)   . S/P endoscopy 2007, 2012    2007: nl, May 2012: antral erosions  . Osteoarthritis   . S/P colonoscopy 2007, Feb and May 2012    hx of adenomas, left-sided diverticula,   . Prostate cancer     BPH  . Anxiety   . Thyroid disease     hypothyroid  . Ringing in ears   . History of kidney stones   . History of bladder infections     Past Surgical History  Procedure Date  . Knee arthroscopy   . Rotator cuff repair   . Cholecystectomy   . Lumbar spine surgery     Current Outpatient Prescriptions  Medication Sig Dispense Refill  . diclofenac (VOLTAREN) 0.1 % ophthalmic solution 1 drop 4 (four) times daily.        . diclofenac (VOLTAREN) 75 MG EC tablet Take 75 mg by mouth 2 (two) times daily.       .  diclofenac sodium (VOLTAREN) 1 % GEL Apply 1 application topically 4 (four) times daily.        . dorzolamide (TRUSOPT) 2 % ophthalmic solution Place 1 drop into both eyes 2 (two) times daily.       . furosemide (LASIX) 80 MG tablet Take 80 mg by mouth 2 (two) times daily.       . lansoprazole (PREVACID) 15 MG capsule Take 15 mg by mouth daily. Prn heartburn      . levothyroxine (SYNTHROID, LEVOTHROID) 25 MCG tablet Take 25 mcg by mouth daily.        . metoprolol succinate (TOPROL-XL) 25 MG 24 hr tablet Take 12.5 mg by mouth daily.       [name] [name] Glycol-Propyl Glycol (SYSTANE) 0.4-0.3 % SOLN Apply 1 drop to eye as needed. As needed daily to both eyes for dryness      . simvastatin (ZOCOR) 40 MG tablet Take 40 mg by mouth at bedtime.        . Tamsulosin HCl (FLOMAX) 0.4 MG CAPS Take 0.4 mg by mouth daily after supper.        . travoprost, benzalkonium, (TRAVATAN) 0.004 % ophthalmic solution Place 1 drop into both eyes at bedtime.          Allergies as of 04/20/2011 - Review Complete 04/20/2011  Allergen Reaction Noted  . Aspirin      Family History  Problem Relation Age of Onset  . Colon cancer Father 39    deceased  . Prostate cancer Father   . Pancreatic cancer Mother 67    deceased    History   Social History  . Marital Status: Married    Spouse Name: N/A    Number of Children: N/A  . Years of Education: N/A   Social History Main Topics  . Smoking status: Former Smoker    Types: Cigars    Quit date: 08/10/1973  . Smokeless tobacco: Never Used  . Alcohol Use: No  . Drug Use: No  . Sexually Active: None   Other Topics Concern  . None   Social History Narrative  . None    Review of Systems: Gen: Denies fever, chills, anorexia. Denies fatigue, weakness, weight loss.  CV: Denies chest pain, palpitations, syncope, peripheral edema, and claudication. Resp: Denies dyspnea at rest, cough, wheezing, coughing up blood, and pleurisy. GI: SEE HPI Derm: Denies rash,  itching, dry skin Psych: Denies depression, anxiety, memory loss, confusion. No homicidal or suicidal ideation.  Heme: Denies bruising, bleeding, and enlarged lymph nodes.  Physical Exam: BP 139/88  Pulse 89  Temp(Src) 98.2 F (36.8 C) (Temporal)  Ht 5\' 6"  (1.676 m)  Wt 303 lb 3.2 oz (137.531 kg)  BMI 48.94 kg/m2 General:   Alert and oriented. No distress noted. Pleasant and cooperative.  Head:  Normocephalic and atraumatic. Eyes:  Conjuctiva clear without scleral icterus. Mouth:  Oral mucosa pink and moist. Good dentition. No lesions. Neck:  Supple, without mass or thyromegaly. Heart:  S1, S2 present without murmurs, rubs, or gallops. Regular rate and rhythm. Abdomen:  +BS, soft, non-tender and non-distended. OBESE No rebound or guarding. No HSM or masses noted. Msk:  Symmetrical without gross deformities. Normal posture. Extremities:  Chronic edema, 2+ bilaterally Neurologic:  Alert and  oriented x4;  grossly normal neurologically. Skin:  Intact without significant lesions or rashes. Cervical Nodes:  No significant cervical adenopathy. Psych:  Alert and cooperative. Normal mood and affect.

## 2011-04-20 NOTE — Assessment & Plan Note (Signed)
Epigastric pain, intermittent, in the setting of Voltaren use for several weeks and lack of PPI. No melena noted, no warning signs. Wt stable. Recent EGD on file; he does have a hx of PUD, with last EGD showing antral erosions. Intermittent nausea likely r/t current issue; will keep in mind possibly pursuing GES if continued nausea and improvement of pain.   Start Prilosec 20 mg daily. # 14 samples supplied. Pt to call in 10 days to give [name] PR.  3 mos f/u STOP Voltaren

## 2011-04-21 NOTE — Progress Notes (Signed)
Cc to PCP 

## 2011-05-03 ENCOUNTER — Telehealth: Payer: Self-pay | Admitting: Internal Medicine

## 2011-05-03 NOTE — Telephone Encounter (Signed)
Patient is calling an letting us know that the prilosec is working some still having pain every now an then the pain is not as bad as it was.

## 2011-05-04 MED ORDER — OMEPRAZOLE 20 MG PO CPDR: 20.0000 mg | DELAYED_RELEASE_CAPSULE | Freq: Every day | ORAL | Status: DC

## 2011-05-04 NOTE — Telephone Encounter (Signed)
Reminder in epic to follow up in 3 months °

## 2011-05-04 NOTE — Telephone Encounter (Signed)
Glad to hear. I sent in a rx for Prilosec to his pharmacy. We need to see him back in 3 mos.

## 2011-05-27 ENCOUNTER — Encounter (HOSPITAL_COMMUNITY): Payer: Medicare Other | Attending: Oncology

## 2011-05-27 DIAGNOSIS — D696 Thrombocytopenia, unspecified: Secondary | ICD-10-CM | POA: Diagnosis not present

## 2011-05-27 LAB — CBC
HCT: 45.6 % (ref 39.0–52.0)
Hemoglobin: 14.6 g/dL (ref 13.0–17.0)
MCV: 81.4 fL (ref 78.0–100.0)
RBC: 5.6 MIL/uL (ref 4.22–5.81)
WBC: 11.3 10*3/uL — ABNORMAL HIGH (ref 4.0–10.5)

## 2011-05-27 NOTE — Progress Notes (Signed)
Labs drawn today for cbc 

## 2011-05-30 ENCOUNTER — Encounter (HOSPITAL_COMMUNITY): Payer: Self-pay | Admitting: Oncology

## 2011-05-30 ENCOUNTER — Encounter (HOSPITAL_BASED_OUTPATIENT_CLINIC_OR_DEPARTMENT_OTHER): Payer: Medicare Other | Admitting: Oncology

## 2011-05-30 VITALS — BP 149/85 | HR 81 | Temp 98.1°F | Ht 66.0 in | Wt 302.0 lb

## 2011-05-30 DIAGNOSIS — D696 Thrombocytopenia, unspecified: Secondary | ICD-10-CM | POA: Diagnosis not present

## 2011-05-30 DIAGNOSIS — D72829 Elevated white blood cell count, unspecified: Secondary | ICD-10-CM | POA: Diagnosis not present

## 2011-05-30 NOTE — Progress Notes (Signed)
CC:   [name] [name]. [name] Gambler, MD  DIAGNOSES: 1. Intermittent thrombocytopenia. 2. Mild leukocytosis which is improved since September.  [name]'s     platelets have still stayed fairly strong at 144,000.  His white     count has come down from 14,700 in June to 11,300, hemoglobin has     remained the same at 14.6 g.  His MCV is normal. He stills on a host of medications which are in the chart. He has no distinct splenomegaly but he does have a fatty liver.  His HIV test was negative.  Other workup was also unremarkable.  So we are just going to watch him.  He is not having frequent infections, he is having no bleeding and I do not think that he should with these platelet levels.  We will see him back in August, sooner if need be, but we just need to watch him at this juncture.    ______________________________ Ladona Horns. Mariel Sleet, MD ESN/MEDQ  D:  05/30/2011  T:  05/30/2011  Job:  [id number]

## 2011-05-30 NOTE — Patient Instructions (Signed)
[facility]  Discharge Instructions  RECOMMENDATIONS MADE BY THE CONSULTANT AND ANY TEST RESULTS WILL BE SENT TO YOUR REFERRING DOCTOR.   EXAM FINDINGS BY MD TODAY AND SIGNS AND SYMPTOMS TO REPORT TO CLINIC OR PRIMARY MD: doing well we will see you in August with labs before visit.  Call with problems or concerns.  I acknowledge that I have been informed and understand all the instructions given to me and received a copy. I do not have any more questions at this time, but understand that I may call the Specialty Clinic at [facility] at [phone] during business hours should I have any further questions or need assistance in obtaining follow-up care.    __________________________________________  _____________  __________ Signature of Patient or Authorized Representative            Date                   Time    __________________________________________ Nurse's Signature

## 2011-05-30 NOTE — Progress Notes (Signed)
This office note has been dictated.

## 2011-05-31 ENCOUNTER — Other Ambulatory Visit (HOSPITAL_COMMUNITY): Payer: Self-pay | Admitting: Internal Medicine

## 2011-05-31 DIAGNOSIS — Z6841 Body Mass Index (BMI) 40.0 and over, adult: Secondary | ICD-10-CM | POA: Diagnosis not present

## 2011-05-31 DIAGNOSIS — R51 Headache: Secondary | ICD-10-CM | POA: Diagnosis not present

## 2011-05-31 DIAGNOSIS — E669 Obesity, unspecified: Secondary | ICD-10-CM | POA: Diagnosis not present

## 2011-05-31 DIAGNOSIS — I10 Essential (primary) hypertension: Secondary | ICD-10-CM | POA: Diagnosis not present

## 2011-05-31 DIAGNOSIS — E785 Hyperlipidemia, unspecified: Secondary | ICD-10-CM | POA: Diagnosis not present

## 2011-05-31 DIAGNOSIS — E039 Hypothyroidism, unspecified: Secondary | ICD-10-CM | POA: Diagnosis not present

## 2011-06-01 ENCOUNTER — Ambulatory Visit (HOSPITAL_COMMUNITY)
Admission: RE | Admit: 2011-06-01 | Discharge: 2011-06-01 | Disposition: A | Discharge status: Home / Self Care | Payer: Medicare Other | Source: Ambulatory Visit | Attending: Internal Medicine | Admitting: Internal Medicine

## 2011-06-01 DIAGNOSIS — H547 Unspecified visual loss: Secondary | ICD-10-CM | POA: Diagnosis not present

## 2011-06-01 DIAGNOSIS — R51 Headache: Secondary | ICD-10-CM | POA: Diagnosis not present

## 2011-06-01 DIAGNOSIS — H5460 Unqualified visual loss, one eye, unspecified: Secondary | ICD-10-CM | POA: Diagnosis not present

## 2011-06-01 DIAGNOSIS — E785 Hyperlipidemia, unspecified: Secondary | ICD-10-CM | POA: Diagnosis not present

## 2011-06-02 ENCOUNTER — Ambulatory Visit (HOSPITAL_COMMUNITY): Payer: Medicare Other

## 2011-06-09 DIAGNOSIS — M7750 Other enthesopathy of unspecified foot: Secondary | ICD-10-CM | POA: Diagnosis not present

## 2011-06-09 DIAGNOSIS — M722 Plantar fascial fibromatosis: Secondary | ICD-10-CM | POA: Diagnosis not present

## 2011-06-09 DIAGNOSIS — M79609 Pain in unspecified limb: Secondary | ICD-10-CM | POA: Diagnosis not present

## 2011-06-27 DIAGNOSIS — H4011X0 Primary open-angle glaucoma, stage unspecified: Secondary | ICD-10-CM | POA: Diagnosis not present

## 2011-06-27 DIAGNOSIS — H2510 Age-related nuclear cataract, unspecified eye: Secondary | ICD-10-CM | POA: Diagnosis not present

## 2011-06-27 DIAGNOSIS — H409 Unspecified glaucoma: Secondary | ICD-10-CM | POA: Diagnosis not present

## 2011-06-30 DIAGNOSIS — M7750 Other enthesopathy of unspecified foot: Secondary | ICD-10-CM | POA: Diagnosis not present

## 2011-06-30 DIAGNOSIS — M25579 Pain in unspecified ankle and joints of unspecified foot: Secondary | ICD-10-CM | POA: Diagnosis not present

## 2011-06-30 DIAGNOSIS — M79609 Pain in unspecified limb: Secondary | ICD-10-CM | POA: Diagnosis not present

## 2011-07-11 ENCOUNTER — Encounter: Payer: Self-pay | Admitting: Internal Medicine

## 2011-07-13 DIAGNOSIS — M545 Low back pain, unspecified: Secondary | ICD-10-CM | POA: Diagnosis not present

## 2011-07-13 DIAGNOSIS — M1710 Unilateral primary osteoarthritis, unspecified knee: Secondary | ICD-10-CM | POA: Diagnosis not present

## 2011-07-13 DIAGNOSIS — M47817 Spondylosis without myelopathy or radiculopathy, lumbosacral region: Secondary | ICD-10-CM | POA: Diagnosis not present

## 2011-07-13 DIAGNOSIS — IMO0002 Reserved for concepts with insufficient information to code with codable children: Secondary | ICD-10-CM | POA: Diagnosis not present

## 2011-07-13 DIAGNOSIS — M25569 Pain in unspecified knee: Secondary | ICD-10-CM | POA: Diagnosis not present

## 2011-07-14 DIAGNOSIS — R209 Unspecified disturbances of skin sensation: Secondary | ICD-10-CM | POA: Diagnosis not present

## 2011-07-14 DIAGNOSIS — M47817 Spondylosis without myelopathy or radiculopathy, lumbosacral region: Secondary | ICD-10-CM | POA: Diagnosis not present

## 2011-07-14 DIAGNOSIS — G894 Chronic pain syndrome: Secondary | ICD-10-CM | POA: Diagnosis not present

## 2011-07-14 DIAGNOSIS — M545 Low back pain, unspecified: Secondary | ICD-10-CM | POA: Diagnosis not present

## 2011-07-22 HISTORY — PX: OTHER SURGICAL HISTORY: SHX169

## 2011-07-27 DIAGNOSIS — R609 Edema, unspecified: Secondary | ICD-10-CM | POA: Diagnosis not present

## 2011-07-27 DIAGNOSIS — I5022 Chronic systolic (congestive) heart failure: Secondary | ICD-10-CM | POA: Diagnosis not present

## 2011-07-27 DIAGNOSIS — E669 Obesity, unspecified: Secondary | ICD-10-CM | POA: Diagnosis not present

## 2011-07-27 DIAGNOSIS — I10 Essential (primary) hypertension: Secondary | ICD-10-CM | POA: Diagnosis not present

## 2011-07-28 DIAGNOSIS — R209 Unspecified disturbances of skin sensation: Secondary | ICD-10-CM | POA: Diagnosis not present

## 2011-07-28 DIAGNOSIS — M47817 Spondylosis without myelopathy or radiculopathy, lumbosacral region: Secondary | ICD-10-CM | POA: Diagnosis not present

## 2011-07-28 DIAGNOSIS — M545 Low back pain, unspecified: Secondary | ICD-10-CM | POA: Diagnosis not present

## 2011-07-28 DIAGNOSIS — G894 Chronic pain syndrome: Secondary | ICD-10-CM | POA: Diagnosis not present

## 2011-07-28 DIAGNOSIS — IMO0001 Reserved for inherently not codable concepts without codable children: Secondary | ICD-10-CM | POA: Diagnosis not present

## 2011-08-01 DIAGNOSIS — G894 Chronic pain syndrome: Secondary | ICD-10-CM | POA: Diagnosis not present

## 2011-08-01 DIAGNOSIS — IMO0001 Reserved for inherently not codable concepts without codable children: Secondary | ICD-10-CM | POA: Diagnosis not present

## 2011-08-01 DIAGNOSIS — M25569 Pain in unspecified knee: Secondary | ICD-10-CM | POA: Diagnosis not present

## 2011-08-01 DIAGNOSIS — IMO0002 Reserved for concepts with insufficient information to code with codable children: Secondary | ICD-10-CM | POA: Diagnosis not present

## 2011-08-01 DIAGNOSIS — M545 Low back pain, unspecified: Secondary | ICD-10-CM | POA: Diagnosis not present

## 2011-08-04 DIAGNOSIS — N400 Enlarged prostate without lower urinary tract symptoms: Secondary | ICD-10-CM | POA: Diagnosis not present

## 2011-08-08 DIAGNOSIS — IMO0002 Reserved for concepts with insufficient information to code with codable children: Secondary | ICD-10-CM | POA: Diagnosis not present

## 2011-08-08 DIAGNOSIS — M1710 Unilateral primary osteoarthritis, unspecified knee: Secondary | ICD-10-CM | POA: Diagnosis not present

## 2011-08-08 DIAGNOSIS — IMO0001 Reserved for inherently not codable concepts without codable children: Secondary | ICD-10-CM | POA: Diagnosis not present

## 2011-08-08 DIAGNOSIS — M79609 Pain in unspecified limb: Secondary | ICD-10-CM | POA: Diagnosis not present

## 2011-08-08 DIAGNOSIS — M25569 Pain in unspecified knee: Secondary | ICD-10-CM | POA: Diagnosis not present

## 2011-08-08 DIAGNOSIS — G894 Chronic pain syndrome: Secondary | ICD-10-CM | POA: Diagnosis not present

## 2011-08-08 DIAGNOSIS — M545 Low back pain, unspecified: Secondary | ICD-10-CM | POA: Diagnosis not present

## 2011-08-08 DIAGNOSIS — M47817 Spondylosis without myelopathy or radiculopathy, lumbosacral region: Secondary | ICD-10-CM | POA: Diagnosis not present

## 2011-08-08 DIAGNOSIS — R209 Unspecified disturbances of skin sensation: Secondary | ICD-10-CM | POA: Diagnosis not present

## 2011-08-15 DIAGNOSIS — M545 Low back pain, unspecified: Secondary | ICD-10-CM | POA: Diagnosis not present

## 2011-08-15 DIAGNOSIS — IMO0001 Reserved for inherently not codable concepts without codable children: Secondary | ICD-10-CM | POA: Diagnosis not present

## 2011-08-15 DIAGNOSIS — M25569 Pain in unspecified knee: Secondary | ICD-10-CM | POA: Diagnosis not present

## 2011-08-15 DIAGNOSIS — G894 Chronic pain syndrome: Secondary | ICD-10-CM | POA: Diagnosis not present

## 2011-08-15 DIAGNOSIS — IMO0002 Reserved for concepts with insufficient information to code with codable children: Secondary | ICD-10-CM | POA: Diagnosis not present

## 2011-08-15 DIAGNOSIS — M1710 Unilateral primary osteoarthritis, unspecified knee: Secondary | ICD-10-CM | POA: Diagnosis not present

## 2011-10-27 ENCOUNTER — Encounter: Payer: Self-pay | Admitting: Gastroenterology

## 2011-10-27 ENCOUNTER — Ambulatory Visit (INDEPENDENT_AMBULATORY_CARE_PROVIDER_SITE_OTHER): Payer: Medicare Other | Admitting: Gastroenterology

## 2011-10-27 VITALS — BP 129/70 | HR 70 | Temp 98.4°F | Ht 69.0 in | Wt 300.8 lb

## 2011-10-27 DIAGNOSIS — R198 Other specified symptoms and signs involving the digestive system and abdomen: Secondary | ICD-10-CM

## 2011-10-27 DIAGNOSIS — R141 Gas pain: Secondary | ICD-10-CM | POA: Diagnosis not present

## 2011-10-27 DIAGNOSIS — R630 Anorexia: Secondary | ICD-10-CM

## 2011-10-27 DIAGNOSIS — R194 Change in bowel habit: Secondary | ICD-10-CM | POA: Insufficient documentation

## 2011-10-27 DIAGNOSIS — E039 Hypothyroidism, unspecified: Secondary | ICD-10-CM | POA: Diagnosis not present

## 2011-10-27 DIAGNOSIS — R143 Flatulence: Secondary | ICD-10-CM | POA: Diagnosis not present

## 2011-10-27 DIAGNOSIS — R1010 Upper abdominal pain, unspecified: Secondary | ICD-10-CM

## 2011-10-27 DIAGNOSIS — R6881 Early satiety: Secondary | ICD-10-CM

## 2011-10-27 DIAGNOSIS — K219 Gastro-esophageal reflux disease without esophagitis: Secondary | ICD-10-CM | POA: Diagnosis not present

## 2011-10-27 DIAGNOSIS — R109 Unspecified abdominal pain: Secondary | ICD-10-CM

## 2011-10-27 LAB — COMPREHENSIVE METABOLIC PANEL
ALT: 12 U/L (ref 0–53)
AST: 15 U/L (ref 0–37)
Albumin: 3.6 g/dL (ref 3.5–5.2)
Alkaline Phosphatase: 65 U/L (ref 39–117)
BUN: 11 mg/dL (ref 6–23)
CO2: 30 mEq/L (ref 19–32)
Calcium: 9.1 mg/dL (ref 8.4–10.5)
Chloride: 104 mEq/L (ref 96–112)
Creat: 0.88 mg/dL (ref 0.50–1.35)
Glucose, Bld: 70 mg/dL (ref 70–99)
Potassium: 4.3 mEq/L (ref 3.5–5.3)
Sodium: 140 mEq/L (ref 135–145)
Total Bilirubin: 1 mg/dL (ref 0.3–1.2)
Total Protein: 6 g/dL (ref 6.0–8.3)

## 2011-10-27 LAB — CBC WITH DIFFERENTIAL/PLATELET
Eosinophils Absolute: 0.1 10*3/uL (ref 0.0–0.7)
HCT: 45.2 % (ref 39.0–52.0)
Hemoglobin: 14.6 g/dL (ref 13.0–17.0)
Lymphs Abs: 1.5 10*3/uL (ref 0.7–4.0)
MCH: 25.7 pg — ABNORMAL LOW (ref 26.0–34.0)
Monocytes Absolute: 0.8 10*3/uL (ref 0.1–1.0)
Monocytes Relative: 7 % (ref 3–12)
Neutro Abs: 8.7 10*3/uL — ABNORMAL HIGH (ref 1.7–7.7)
Neutrophils Relative %: 78 % — ABNORMAL HIGH (ref 43–77)
RBC: 5.69 MIL/uL (ref 4.22–5.81)

## 2011-10-27 LAB — TSH: TSH: 3.642 u[IU]/mL (ref 0.350–4.500)

## 2011-10-27 MED ORDER — RESTORA PO CAPS: 1.0000 | ORAL_CAPSULE | Freq: Every day | ORAL | Status: AC

## 2011-10-27 NOTE — Assessment & Plan Note (Signed)
Multiple GI symptoms including upper abdominal pain/bloating, early satiety, heartburn. Also with alternating constipation/diarrhea. Symptoms more persistent the last few weeks. He has had similar symptoms off/on for years. He stopped his synthroid several months ago, need to check TSH to see where it stands off medication. Check renal function, CBC, LFTs. He has a lot of lower ext edema. Takes 160mg  of lasix daily. Will increase omeprazole to BID (samples of Prilosec OTC to supplement RX) for next four weeks. Add restora one daily for four weeks to help stabilize stools and gas. Check GES for early satiety, bloating, etc. OV in six weeks.   I have advised him to see his PCP about his lower ext edema and the fact that he is not taking his synthroid. We will send copy of labs as available.

## 2011-10-27 NOTE — Patient Instructions (Signed)
Take your omeprazole 30 minutes before breakfast and take Prilosec OTC (samples) 30 minutes before evening meal. Take Restora one daily for four weeks. This is a probiotic.  Have your blood work done. Have your stomach xray done. Office visit in six weeks.   Bloating Bloating is the feeling of fullness in your belly. You may feel as though your pants are too tight. Often the cause of bloating is overeating, retaining fluids, or having gas in your bowel. It is also caused by swallowing air and eating foods that cause gas. Irritable bowel syndrome is one of the most common causes of bloating. Constipation is also a common cause. Sometimes more serious problems can cause bloating. SYMPTOMS  Usually there is a feeling of fullness, as though your abdomen is bulged out. There may be mild discomfort.  DIAGNOSIS  Usually no particular testing is necessary for most bloating. If the condition persists and seems to become worse, your caregiver may do additional testing.  TREATMENT   There is no direct treatment for bloating.   Do not put gas into the bowel. Avoid chewing gum and sucking on candy. These tend to make you swallow air. Swallowing air can also be a nervous habit. Try to avoid this.   Avoiding high residue diets will help. Eat foods with soluble fibers (examples include root vegetables, apples, or barley) and substitute dairy products with soy and rice products. This helps irritable bowel syndrome.   If constipation is the cause, then a high residue diet with more fiber will help.   Avoid carbonated beverages.   Over-the-counter preparations are available that help reduce gas. Your pharmacist can help you with this.  SEEK MEDICAL CARE IF:   Bloating continues and seems to be getting worse.   You notice a weight gain.   You have a weight loss but the bloating is getting worse.   You have changes in your bowel habits or develop nausea or vomiting.  SEEK IMMEDIATE MEDICAL CARE IF:    You develop shortness of breath or swelling in your legs.   You have an increase in abdominal pain or develop chest pain.  Document Released: 03/09/2006 Document Revised: 04/28/2011 Document Reviewed: 04/27/2007 [facility] Patient Information 2012 [place], [place].

## 2011-10-27 NOTE — Progress Notes (Signed)
Faxed to PCP

## 2011-10-27 NOTE — Progress Notes (Signed)
Primary Care Physician: Cassell Smiles., MD, MD  Primary Gastroenterologist:  Roetta Sessions, MD   Chief Complaint  Patient presents with  . Abdominal Pain  . Gas  . Diarrhea    HPI: [name] [name] is a 71 y.o. male here for f/u of abdominal pain, gas, diarrhea. Last seen in 03/2011. At that time patient was complaining of intermittent epigastric pain, poor appetite, intermittent constipation.   EGD, colonoscopy, sb capsule done 07/2010 for GI bleeding. ?transient focal ischemia on SB imaging. Mesenteric vasculature OK on CT 07/2010.   For past few weeks he has had a lot of gas and upper abdominal bloating. Feels like knifes in upper abdomen. Diarrhea for few weeks, imodium two daily not helping. No melena, brbpr. GI symptoms happen throughout day. Can be brought on by drinking water. May happen some time after meals. Appetite not as good. Early satiety. Heartburn and indigestion. No dysphagia. Intermittent diarrhea. 2-3 stools daily. Some days no stool, but alternates between diarrhea to regular to constipation.  States he stopped his synthroid several months ago without PCP knowing. Thought it was causing his depression. Feels like depression better now.  Current Outpatient Prescriptions  Medication Sig Dispense Refill  . acetaminophen (TYLENOL) 325 MG tablet Take 650 mg by mouth every 6 (six) hours as needed.        . diclofenac (VOLTAREN) 0.1 % ophthalmic solution 1 drop 4 (four) times daily.        . dorzolamide (TRUSOPT) 2 % ophthalmic solution Place 1 drop into both eyes 2 (two) times daily.       . furosemide (LASIX) 80 MG tablet Take 80 mg by mouth 2 (two) times daily.       Marland Kitchen HYDROcodone-acetaminophen (LORTAB) 7.5-500 MG per tablet Take 1 tablet by mouth every 4 (four) hours as needed.       . metoprolol succinate (TOPROL-XL) 25 MG 24 hr tablet Take 12.5 mg by mouth daily.       Marland Kitchen omeprazole (PRILOSEC) 20 MG capsule Take 1 capsule (20 mg total) by mouth daily. 30 minutes  before first meal  30 capsule  5  . Tamsulosin HCl (FLOMAX) 0.4 MG CAPS Take 0.4 mg by mouth daily after supper.        . travoprost, benzalkonium, (TRAVATAN) 0.004 % ophthalmic solution Place 1 drop into both eyes at bedtime.        Marland Kitchen levothyroxine (SYNTHROID, LEVOTHROID) 25 MCG tablet Take 25 mcg by mouth daily.          Allergies as of 10/27/2011 - Review Complete 10/27/2011  Allergen Reaction Noted  . Aspirin     Past Medical History  Diagnosis Date  . CHF (congestive heart failure)   . Hypertension   . Obesity   . Hyperlipidemia   . PUD (peptic ulcer disease)   . GERD (gastroesophageal reflux disease)   . S/P endoscopy 2007, 2012    2007: nl, May 2012: antral erosions  . Osteoarthritis   . S/P colonoscopy 2007, Feb and March 2012    hx of adenomas, left-sided diverticula, On 07/2010 TCS, fresh blood and clot noted coming from TI.  Marland Kitchen Prostate cancer     BPH  . Anxiety   . Thyroid disease     hypothyroid  . Ringing in ears   . History of kidney stones   . History of bladder infections   . Heel spur     right heel   Past Surgical History  Procedure Date  .  Knee arthroscopy   . Rotator cuff repair   . Cholecystectomy   . Lumbar spine surgery   . Small bowel capsule study 07/2011    ?transient focal ischemia in distal small bowel to explain GI bleeding    ROS:  General: Negative for weight loss, fever, chills, fatigue, weakness. See hpi. ENT: Negative for hoarseness, difficulty swallowing , nasal congestion. CV: Negative for chest pain, angina, palpitations, dyspnea on exertion, peripheral edema.  Respiratory: Negative for dyspnea at rest, dyspnea on exertion, cough, sputum, wheezing.  GI: See history of present illness. GU:  Negative for dysuria, hematuria, urinary incontinence, urinary frequency, nocturnal urination.  Endo: Negative for unusual weight change.    Physical Examination:   BP 129/70  Pulse 70  Temp(Src) 98.4 F (36.9 C) (Temporal)  Ht 5\' 9"   (1.753 m)  Wt 300 lb 12.8 oz (136.442 kg)  BMI 44.42 kg/m2  General: Well-nourished, well-developed in no acute distress.  Eyes: No icterus. Mouth: Oropharyngeal mucosa moist and pink , no lesions erythema or exudate. Lungs: Clear to auscultation bilaterally.  Heart: Regular rate and rhythm, no murmurs rubs or gallops.  Abdomen: Bowel sounds are normal, mild upper abd tenderness throughout, nondistended, no hepatosplenomegaly or masses, no abdominal bruits or hernia , no rebound or guarding.   Extremities: 2-3+ PE to knees bilaterally. No clubbing or deformities. Neuro: Alert and oriented x 4   Skin: Warm and dry, no jaundice.   Psych: Alert and cooperative, normal mood and affect.

## 2011-10-28 LAB — TISSUE TRANSGLUTAMINASE, IGA: Tissue Transglutaminase Ab, IgA: 7 U/mL (ref <20)

## 2011-10-31 DIAGNOSIS — G894 Chronic pain syndrome: Secondary | ICD-10-CM | POA: Diagnosis not present

## 2011-10-31 DIAGNOSIS — M25569 Pain in unspecified knee: Secondary | ICD-10-CM | POA: Diagnosis not present

## 2011-10-31 DIAGNOSIS — IMO0002 Reserved for concepts with insufficient information to code with codable children: Secondary | ICD-10-CM | POA: Diagnosis not present

## 2011-10-31 DIAGNOSIS — M1710 Unilateral primary osteoarthritis, unspecified knee: Secondary | ICD-10-CM | POA: Diagnosis not present

## 2011-10-31 NOTE — Progress Notes (Signed)
Quick Note:  Please let pt know: Labs look good. He has chronically elevated WBC and slightly low platelets which he follow with Dr. Mariel Sleet, they are stable. His TSH is ok but I would still advice him to touch base with PCP and make them aware that he stopped his synthroid. It will need to be rechecked in future to make sure stays within normal range. Send copy of labs to PCP. Liver, kidneys, celiac negative.  Await GES. ______

## 2011-11-01 DIAGNOSIS — E669 Obesity, unspecified: Secondary | ICD-10-CM | POA: Diagnosis not present

## 2011-11-01 DIAGNOSIS — E782 Mixed hyperlipidemia: Secondary | ICD-10-CM | POA: Diagnosis not present

## 2011-11-01 DIAGNOSIS — I872 Venous insufficiency (chronic) (peripheral): Secondary | ICD-10-CM | POA: Diagnosis not present

## 2011-11-02 ENCOUNTER — Encounter (HOSPITAL_COMMUNITY)
Admission: RE | Admit: 2011-11-02 | Discharge: 2011-11-02 | Disposition: A | Discharge status: Home / Self Care | Payer: Medicare Other | Source: Ambulatory Visit | Attending: Gastroenterology | Admitting: Gastroenterology

## 2011-11-02 ENCOUNTER — Encounter (HOSPITAL_COMMUNITY): Payer: Self-pay

## 2011-11-02 DIAGNOSIS — K219 Gastro-esophageal reflux disease without esophagitis: Secondary | ICD-10-CM | POA: Insufficient documentation

## 2011-11-02 DIAGNOSIS — R142 Eructation: Secondary | ICD-10-CM | POA: Insufficient documentation

## 2011-11-02 DIAGNOSIS — R110 Nausea: Secondary | ICD-10-CM | POA: Diagnosis not present

## 2011-11-02 DIAGNOSIS — R143 Flatulence: Secondary | ICD-10-CM | POA: Insufficient documentation

## 2011-11-02 DIAGNOSIS — R109 Unspecified abdominal pain: Secondary | ICD-10-CM | POA: Diagnosis not present

## 2011-11-02 DIAGNOSIS — R630 Anorexia: Secondary | ICD-10-CM | POA: Insufficient documentation

## 2011-11-02 DIAGNOSIS — R141 Gas pain: Secondary | ICD-10-CM | POA: Insufficient documentation

## 2011-11-02 DIAGNOSIS — R6881 Early satiety: Secondary | ICD-10-CM | POA: Insufficient documentation

## 2011-11-02 HISTORY — DX: Systemic involvement of connective tissue, unspecified: M35.9

## 2011-11-02 MED ORDER — TECHNETIUM TC 99M SULFUR COLLOID
2.0000 | Freq: Once | INTRAVENOUS | Status: AC | PRN
  Administered 2011-11-02: 2.2 via ORAL

## 2011-11-04 DIAGNOSIS — Z79899 Other long term (current) drug therapy: Secondary | ICD-10-CM | POA: Diagnosis not present

## 2011-11-04 DIAGNOSIS — E785 Hyperlipidemia, unspecified: Secondary | ICD-10-CM | POA: Diagnosis not present

## 2011-11-04 DIAGNOSIS — Z6841 Body Mass Index (BMI) 40.0 and over, adult: Secondary | ICD-10-CM | POA: Diagnosis not present

## 2011-11-04 DIAGNOSIS — J328 Other chronic sinusitis: Secondary | ICD-10-CM | POA: Diagnosis not present

## 2011-11-04 DIAGNOSIS — I10 Essential (primary) hypertension: Secondary | ICD-10-CM | POA: Diagnosis not present

## 2011-11-04 DIAGNOSIS — R5381 Other malaise: Secondary | ICD-10-CM | POA: Diagnosis not present

## 2011-11-04 DIAGNOSIS — E878 Other disorders of electrolyte and fluid balance, not elsewhere classified: Secondary | ICD-10-CM | POA: Diagnosis not present

## 2011-11-04 DIAGNOSIS — E039 Hypothyroidism, unspecified: Secondary | ICD-10-CM | POA: Diagnosis not present

## 2011-11-07 NOTE — Progress Notes (Signed)
Quick Note:  Stomach empties normally. Keep OV as planned. Continue PPI BID. ______

## 2011-11-10 ENCOUNTER — Other Ambulatory Visit: Payer: Self-pay

## 2011-11-10 NOTE — Progress Notes (Signed)
Pt called to tell us that medication was on his list but he is not taking it.

## 2011-11-14 DIAGNOSIS — M7989 Other specified soft tissue disorders: Secondary | ICD-10-CM | POA: Diagnosis not present

## 2011-11-15 ENCOUNTER — Ambulatory Visit: Payer: Medicare Other | Admitting: Gastroenterology

## 2011-11-23 DIAGNOSIS — R609 Edema, unspecified: Secondary | ICD-10-CM | POA: Diagnosis not present

## 2011-11-23 DIAGNOSIS — G4733 Obstructive sleep apnea (adult) (pediatric): Secondary | ICD-10-CM | POA: Diagnosis not present

## 2011-11-23 DIAGNOSIS — E669 Obesity, unspecified: Secondary | ICD-10-CM | POA: Diagnosis not present

## 2011-12-22 HISTORY — PX: ESOPHAGOGASTRODUODENOSCOPY: SHX1529

## 2011-12-22 NOTE — Progress Notes (Signed)
REVIEWED.  

## 2011-12-23 ENCOUNTER — Encounter: Payer: Self-pay | Admitting: Urgent Care

## 2011-12-23 ENCOUNTER — Ambulatory Visit (INDEPENDENT_AMBULATORY_CARE_PROVIDER_SITE_OTHER): Payer: Medicare Other | Admitting: Urgent Care

## 2011-12-23 ENCOUNTER — Encounter (HOSPITAL_COMMUNITY): Payer: Self-pay | Admitting: Pharmacy Technician

## 2011-12-23 VITALS — BP 121/73 | HR 65 | Temp 98.4°F | Ht 69.0 in | Wt 298.4 lb

## 2011-12-23 DIAGNOSIS — R143 Flatulence: Secondary | ICD-10-CM

## 2011-12-23 DIAGNOSIS — K7689 Other specified diseases of liver: Secondary | ICD-10-CM | POA: Diagnosis not present

## 2011-12-23 DIAGNOSIS — R141 Gas pain: Secondary | ICD-10-CM

## 2011-12-23 DIAGNOSIS — R109 Unspecified abdominal pain: Secondary | ICD-10-CM

## 2011-12-23 DIAGNOSIS — R197 Diarrhea, unspecified: Secondary | ICD-10-CM

## 2011-12-23 DIAGNOSIS — R110 Nausea: Secondary | ICD-10-CM

## 2011-12-23 DIAGNOSIS — K219 Gastro-esophageal reflux disease without esophagitis: Secondary | ICD-10-CM

## 2011-12-23 NOTE — Patient Instructions (Addendum)
Hydrogen breath test for small bowel bacterial overgrowth at [facility] Instructions for fatty liver: Recommend 1-2# weight loss per week until ideal body weight through exercise & diet. Low fat/cholesterol diet. Gradually increase exercise from 15 min daily up to 1 hr per day 5 days/week. See if Dr. Sherwood Gambler is okay with you doing water aerobics at the [facility]. Limit alcohol use. Please pick up your omeprazole prescription at [facility] pharmacy and start taking it again. Continue restora daily Followup with Dr. Sherwood Gambler about your thyroid and swelling in your legs

## 2011-12-23 NOTE — Progress Notes (Signed)
Referring Provider: Cassell Smiles., MD Primary Care Physician:  Cassell Smiles., MD Primary Gastroenterologist:  Dr. Jena Gauss  Chief Complaint  Patient presents with  . Abdominal Pain  . Nausea    HPI:  [name] [name] is a 71 y.o. male here for follow up for chronic nausea, abdominal bloating & diarrhea.  He has had extensive work-up here. He has episodes of "all of a sudden nausea & sickness".  Denies vomiting.  BM 2-3 per day.  Denies rectal bleeding or mucus in stools.  Saw cardiologist about LEE.  He stopped his thyroid medications.  Normal GES recently.  He ran out of probiotics-didn't have money, but felt they worked well.  He also ran out of prilosec 20mg  daily.  He feels it helped.  He has had CT of abd/pain at least annually since 2009 for similar complaints w/ fatty liver, renal cysts,prostatomegaly, but nothing to explain his pain & symptoms.  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 Focal areas of peripheral abnormal enhancement within liver, either representing transient hepatic attenuation difference or atypical hemangiomas. ABD [name] 02/04/11->fatty liver, renal cysts.  He has had EGD, colonoscopy & Givens capsule as below.  Recent Results (from the past 2016 hour(s))  CBC WITH DIFFERENTIAL   Collection Time   10/27/11 11:41 AM      Component Value Range   WBC 11.2 (*) 4.0 - 10.5 K/uL   RBC 5.69  4.22 - 5.81 MIL/uL   Hemoglobin 14.6  13.0 - 17.0 g/dL   HCT 19.1  47.8 - 29.5 %   MCV 79.4  78.0 - 100.0 fL   MCH 25.7 (*) 26.0 - 34.0 pg   MCHC 32.3  30.0 - 36.0 g/dL   RDW 62.1 (*) 30.8 - 65.7 %   Platelets 128 (*) 150 - 400 K/uL   Neutrophils Relative 78 (*) 43 - 77 %   Neutro Abs 8.7 (*) 1.7 - 7.7 K/uL   Lymphocytes Relative 14  12 - 46 %   Lymphs Abs 1.5  0.7 - 4.0 K/uL   Monocytes Relative 7  3 - 12 %   Monocytes Absolute 0.8  0.1 - 1.0 K/uL   Eosinophils Relative 1  0 - 5 %   Eosinophils Absolute 0.1  0.0 - 0.7 K/uL   Basophils Relative 0  0 - 1 %   Basophils Absolute 0.0   0.0 - 0.1 K/uL   Smear Review Criteria for review not met    COMPREHENSIVE METABOLIC PANEL   Collection Time   10/27/11 11:41 AM      Component Value Range   Sodium 140  135 - 145 mEq/L   Potassium 4.3  3.5 - 5.3 mEq/L   Chloride 104  96 - 112 mEq/L   CO2 30  19 - 32 mEq/L   Glucose, Bld 70  70 - 99 mg/dL   BUN 11  6 - 23 mg/dL   Creat 8.46  9.62 - 9.52 mg/dL   Total Bilirubin 1.0  0.3 - 1.2 mg/dL   Alkaline Phosphatase 65  39 - 117 U/L   AST 15  0 - 37 U/L   ALT 12  0 - 53 U/L   Total Protein 6.0  6.0 - 8.3 g/dL   Albumin 3.6  3.5 - 5.2 g/dL   Calcium 9.1  8.4 - 84.1 mg/dL  TSH   Collection Time   10/27/11 11:41 AM      Component Value Range   TSH 3.642  0.350 - 4.500  uIU/mL  TISSUE TRANSGLUTAMINASE, IGA   Collection Time   10/27/11 11:41 AM      Component Value Range   Tissue Transglutaminase Ab, IgA 7.0  <20 U/mL     Past Medical History  Diagnosis Date  . CHF (congestive heart failure)   . Hypertension   . Obesity   . Hyperlipidemia   . PUD (peptic ulcer disease)   . GERD (gastroesophageal reflux disease)   . S/P endoscopy 2007, 2012    2007: nl, May 2012: antral erosions  . Osteoarthritis   . S/P colonoscopy 2007, Feb and March 2012    hx of adenomas, left-sided diverticula, On 07/2010 TCS, fresh blood and clot noted coming from TI.  Marland Kitchen Prostate cancer     BPH  . Anxiety   . Thyroid disease     hypothyroid  . Ringing in ears   . History of kidney stones   . History of bladder infections   . Heel spur     right heel  . Collagen vascular disease     Past Surgical History  Procedure Date  . Knee arthroscopy   . Rotator cuff repair   . Cholecystectomy   . Lumbar spine surgery   . Small bowel capsule study 07/2011    ?transient focal ischemia in distal small bowel to explain GI bleeding    Current Outpatient Prescriptions  Medication Sig Dispense Refill  . acetaminophen (TYLENOL) 325 MG tablet Take 650 mg by mouth every 6 (six) hours as needed. Pain        . dorzolamide (TRUSOPT) 2 % ophthalmic solution Place 1 drop into both eyes 2 (two) times daily.       . furosemide (LASIX) 80 MG tablet Take 80 mg by mouth 2 (two) times daily.       Marland Kitchen HYDROcodone-acetaminophen (NORCO) 10-325 MG per tablet Take 1 tablet by mouth every 6 (six) hours as needed. Pain      . metoprolol succinate (TOPROL-XL) 25 MG 24 hr tablet Take 12.5 mg by mouth daily.       Marland Kitchen omeprazole (PRILOSEC) 20 MG capsule Take 1 capsule (20 mg total) by mouth daily. 30 minutes before first meal  30 capsule  5  . simvastatin (ZOCOR) 40 MG tablet Take 40 mg by mouth every evening.      . Tamsulosin HCl (FLOMAX) 0.4 MG CAPS Take 0.4 mg by mouth daily after supper.        . travoprost, benzalkonium, (TRAVATAN) 0.004 % ophthalmic solution Place 1 drop into both eyes at bedtime.          Allergies as of 12/23/2011 - Review Complete 12/23/2011  Allergen Reaction Noted  . Aspirin Other (See Comments)     Review of Systems: See HPI, otherwise negative ROS  Physical Exam: BP 121/73  Pulse 65  Temp 98.4 F (36.9 C) (Temporal)  Ht 5\' 9"  (1.753 m)  Wt 298 lb 6.4 oz (135.353 kg)  BMI 44.07 kg/m2 General:   Alert,  Morbidly obese, pleasant and cooperative in NAD Eyes:  Sclera clear, no icterus.   Conjunctiva pink. Mouth:  No deformity or lesions, oropharynx pink and moist. Neck:  Supple; no masses or thyromegaly. Heart:  Regular rate and rhythm; no murmurs, clicks, rubs,  or gallops. Abdomen:  Protuberant.  Normal bowel sounds.  No bruits.  Soft, non-tender and non-distended without masses, hepatosplenomegaly or hernias noted.  No guarding or rebound tenderness.  Exam extremely limited given body habitus. Rectal:  Deferred.  Msk:  Symmetrical without gross deformities.  Pulses:  Normal pulses noted. Extremities:  1+ LEE bilat. Neurologic:  Alert and oriented x4;  grossly normal neurologically. Skin:  Intact without significant lesions or rashes.

## 2011-12-24 ENCOUNTER — Encounter: Payer: Self-pay | Admitting: Urgent Care

## 2011-12-24 NOTE — Assessment & Plan Note (Signed)
See abdominal bloating.  ?functional component.

## 2011-12-24 NOTE — Assessment & Plan Note (Signed)
Persistent abdominal pain & bloating with diarrhea.  Extensive work-up as outlined above.    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 Continue restora daily  Followup with Dr. Sherwood Gambler about your thyroid and swelling in your legs

## 2011-12-24 NOTE — Assessment & Plan Note (Addendum)
Normal LFTs Instructions for fatty liver: Recommend 1-2# weight loss per week until ideal body weight through exercise & diet. Low fat/cholesterol diet. Gradually increase exercise from 15 min daily up to 1 hr per day 5 days/week. See if Dr. Sherwood Gambler is okay with you doing water aerobics at the [facility]. Limit alcohol use.

## 2011-12-24 NOTE — Assessment & Plan Note (Signed)
Improved while on PPI, unfortunately financial concerns have made it difficult to stay on therapy. Resume omeprazole 20mg  daily

## 2011-12-26 ENCOUNTER — Emergency Department (HOSPITAL_COMMUNITY): Payer: Medicare Other

## 2011-12-26 ENCOUNTER — Other Ambulatory Visit: Payer: Self-pay

## 2011-12-26 ENCOUNTER — Encounter (HOSPITAL_COMMUNITY): Payer: Self-pay

## 2011-12-26 ENCOUNTER — Other Ambulatory Visit (HOSPITAL_COMMUNITY): Payer: Medicare Other

## 2011-12-26 ENCOUNTER — Emergency Department (HOSPITAL_COMMUNITY)
Admission: EM | Admit: 2011-12-26 | Discharge: 2011-12-26 | Disposition: A | Discharge status: Home / Self Care | Payer: Medicare Other | Attending: Emergency Medicine | Admitting: Emergency Medicine

## 2011-12-26 DIAGNOSIS — J9819 Other pulmonary collapse: Secondary | ICD-10-CM | POA: Diagnosis not present

## 2011-12-26 DIAGNOSIS — R0600 Dyspnea, unspecified: Secondary | ICD-10-CM

## 2011-12-26 DIAGNOSIS — R109 Unspecified abdominal pain: Secondary | ICD-10-CM | POA: Diagnosis not present

## 2011-12-26 DIAGNOSIS — Z79899 Other long term (current) drug therapy: Secondary | ICD-10-CM | POA: Insufficient documentation

## 2011-12-26 DIAGNOSIS — E669 Obesity, unspecified: Secondary | ICD-10-CM | POA: Insufficient documentation

## 2011-12-26 DIAGNOSIS — R1013 Epigastric pain: Secondary | ICD-10-CM | POA: Diagnosis not present

## 2011-12-26 DIAGNOSIS — E079 Disorder of thyroid, unspecified: Secondary | ICD-10-CM | POA: Diagnosis not present

## 2011-12-26 DIAGNOSIS — R51 Headache: Secondary | ICD-10-CM | POA: Insufficient documentation

## 2011-12-26 DIAGNOSIS — R0609 Other forms of dyspnea: Secondary | ICD-10-CM | POA: Diagnosis not present

## 2011-12-26 DIAGNOSIS — R0989 Other specified symptoms and signs involving the circulatory and respiratory systems: Secondary | ICD-10-CM | POA: Diagnosis not present

## 2011-12-26 DIAGNOSIS — I10 Essential (primary) hypertension: Secondary | ICD-10-CM | POA: Insufficient documentation

## 2011-12-26 DIAGNOSIS — K219 Gastro-esophageal reflux disease without esophagitis: Secondary | ICD-10-CM | POA: Insufficient documentation

## 2011-12-26 DIAGNOSIS — Z8546 Personal history of malignant neoplasm of prostate: Secondary | ICD-10-CM | POA: Diagnosis not present

## 2011-12-26 DIAGNOSIS — I509 Heart failure, unspecified: Secondary | ICD-10-CM | POA: Diagnosis not present

## 2011-12-26 DIAGNOSIS — R918 Other nonspecific abnormal finding of lung field: Secondary | ICD-10-CM | POA: Diagnosis not present

## 2011-12-26 LAB — CBC WITH DIFFERENTIAL/PLATELET
Basophils Absolute: 0 10*3/uL (ref 0.0–0.1)
HCT: 41.6 % (ref 39.0–52.0)
Lymphs Abs: 1.4 10*3/uL (ref 0.7–4.0)
MCHC: 33.2 g/dL (ref 30.0–36.0)
MCV: 78.3 fL (ref 78.0–100.0)
Monocytes Relative: 7 % (ref 3–12)
Neutro Abs: 8 10*3/uL — ABNORMAL HIGH (ref 1.7–7.7)
RDW: 16.9 % — ABNORMAL HIGH (ref 11.5–15.5)
WBC: 10.2 10*3/uL (ref 4.0–10.5)

## 2011-12-26 LAB — BASIC METABOLIC PANEL
BUN: 9 mg/dL (ref 6–23)
Chloride: 103 mEq/L (ref 96–112)
Creatinine, Ser: 0.94 mg/dL (ref 0.50–1.35)
GFR calc Af Amer: >90 mL/min (ref >90)

## 2011-12-26 MED ORDER — ONDANSETRON HCL 8 MG PO TABS: 8.0000 mg | ORAL_TABLET | Freq: Three times a day (TID) | ORAL | Status: AC | PRN

## 2011-12-26 MED ORDER — GI COCKTAIL ~~LOC~~
30.0000 mL | Freq: Once | ORAL | Status: AC
  Administered 2011-12-26: 30 mL via ORAL
  Filled 2011-12-26 (×2): qty 30

## 2011-12-26 MED ORDER — ONDANSETRON 8 MG PO TBDP
8.0000 mg | ORAL_TABLET | Freq: Once | ORAL | Status: AC
  Administered 2011-12-26: 8 mg via ORAL
  Filled 2011-12-26: qty 1

## 2011-12-26 MED ORDER — IOHEXOL 350 MG/ML SOLN
100.0000 mL | Freq: Once | INTRAVENOUS | Status: AC | PRN
  Administered 2011-12-26: 100 mL via INTRAVENOUS

## 2011-12-26 MED ORDER — ACETAMINOPHEN 325 MG PO TABS
650.0000 mg | ORAL_TABLET | Freq: Once | ORAL | Status: AC
  Administered 2011-12-26: 650 mg via ORAL
  Filled 2011-12-26: qty 1

## 2011-12-26 MED ORDER — ONDANSETRON 8 MG PO TBDP: 8.0000 mg | ORAL_TABLET | Freq: Once | ORAL | Status: DC

## 2011-12-26 NOTE — Progress Notes (Signed)
Faxed to PCP

## 2011-12-26 NOTE — ED Notes (Signed)
Pt c/o of SOB and HA x 1 wk with numbness/tingling in left arm.  Pt states that syx comes and goes. Pt c/o nausea x 2 days. Denies vomiting.

## 2011-12-26 NOTE — ED Notes (Signed)
Patient sitting in chair - stated he was tired of laying and "just wanted to go home".  Advised patient in regard to delay.

## 2011-12-26 NOTE — ED Provider Notes (Signed)
History   This chart was scribed for Flint Melter, MD by Melba Coon. The patient was seen in room APA08/APA08 and the patient's care was started at 4:48PM.    CSN: [id number]  Arrival date & time 12/26/11  1539   First MD Initiated Contact with Patient 12/26/11 1608      Chief Complaint  Patient presents with  . Shortness of Breath  . Headache  . Abdominal Pain    (Consider location/radiation/quality/duration/timing/severity/associated sxs/prior treatment) The history is provided by the patient. No language interpreter was used.   [name] [name]. is a 71 y.o. male who presents to the Emergency Department complaining of constant, moderate to severe abdominal pain with associated nausea with an onset 3 days ago. Pt states that he has had decreased appetite present for about a week and has been trying to vomit but nothing will come up. Pt is going to GI in 2 days for a hydrogen breath test to check for a fatty liver. Prilosec has not alleviated the abd pain. Bilateral leg pain and tingling, HA and SOB present. No fever, neck pain, sore throat, rash, back pain, CP, abd pain, vomit, diarrhea, dysuria, or extremity edema, weakness, or numbness. Hx of cholecystectomy. Hx of CHF; no Hx of MI or heart surgery. Allergic to ASA. No other pertinent medical symptoms.  GI: Dr. Kendell Bane Cardio: Dr. Dannette Barbara  Past Medical History  Diagnosis Date  . CHF (congestive heart failure)   . Hypertension   . Obesity   . Hyperlipidemia   . PUD (peptic ulcer disease)   . GERD (gastroesophageal reflux disease)   . S/P endoscopy 2007, 2012    2007: nl, May 2012: antral erosions  . Osteoarthritis   . S/P colonoscopy 2007, Feb and March 2012    hx of adenomas, left-sided diverticula, On 07/2010 TCS, fresh blood and clot noted coming from TI.  Marland Kitchen Prostate cancer     BPH  . Anxiety   . Thyroid disease     hypothyroid  . Ringing in ears   . History of kidney stones   . History of bladder infections    . Heel spur     right heel  . Collagen vascular disease     Past Surgical History  Procedure Date  . Knee arthroscopy   . Rotator cuff repair   . Cholecystectomy   . Lumbar spine surgery   . Small bowel capsule study 07/2011    ?transient focal ischemia in distal small bowel to explain GI bleeding    Family History  Problem Relation Age of Onset  . Colon cancer Father 75    deceased  . Prostate cancer Father   . Pancreatic cancer Mother 31    deceased    History  Substance Use Topics  . Smoking status: Former Smoker    Types: Cigars    Quit date: 08/10/1973  . Smokeless tobacco: Never Used  . Alcohol Use: No      Review of Systems 10 Systems reviewed and all are negative for acute change except as noted in the HPI.   Allergies  Aspirin  Home Medications   Current Outpatient Rx  Name Route Sig Dispense Refill  . ACETAMINOPHEN 325 MG PO TABS Oral Take 650 mg by mouth every 6 (six) hours as needed. Pain    . DORZOLAMIDE HCL-TIMOLOL MAL PF 22.3-6.8 MG/ML OP SOLN Ophthalmic Apply 1 drop to eye 2 (two) times daily. Dorzolamide HCI-Timolol Maleate Ophthalmic Solution 2.23%/0.68%    .  FUROSEMIDE 80 MG PO TABS Oral Take 80 mg by mouth 2 (two) times daily.     Marland Kitchen HYDROCODONE-ACETAMINOPHEN 10-325 MG PO TABS Oral Take 1 tablet by mouth every 6 (six) hours as needed. For Pain. *Do not exceed more than 4 tablets per day*    . LATANOPROST 0.005 % OP SOLN Both Eyes Place 1 drop into both eyes at bedtime.    Marland Kitchen METOPROLOL SUCCINATE ER 25 MG PO TB24 Oral Take 12.5 mg by mouth every morning.     Marland Kitchen OMEPRAZOLE 20 MG PO CPDR Oral Take 1 capsule (20 mg total) by mouth daily. 30 minutes before first meal 30 capsule 5  . OXYGEN-HELIUM IN Inhalation Inhale 2 mLs into the lungs 2 (two) times daily.    . RESTORA PO CAPS Oral Take 1 capsule by mouth every morning.    Marland Kitchen SIMVASTATIN 40 MG PO TABS Oral Take 40 mg by mouth every evening.    Marland Kitchen TAMSULOSIN HCL 0.4 MG PO CAPS Oral Take 0.4 mg by  mouth daily after supper.      . TROLAMINE SALICYLATE 10 % EX CREA Topical Apply 1 application topically as needed. *Applied to knee(s)*    . ONDANSETRON HCL 8 MG PO TABS Oral Take 1 tablet (8 mg total) by mouth every 8 (eight) hours as needed for nausea. 20 tablet 0    BP 137/100  Pulse 71  Temp 97.9 F (36.6 C) (Oral)  Resp 20  SpO2 95%  Physical Exam  Nursing note and vitals reviewed. Constitutional: He is oriented to person, place, and time. He appears well-developed and well-nourished. No distress.  HENT:  Head: Normocephalic and atraumatic.  Eyes: EOM are normal.  Neck: Normal range of motion. No tracheal deviation present.  Cardiovascular: Normal rate and regular rhythm.   No murmur heard. Pulmonary/Chest: Effort normal and breath sounds normal. No respiratory distress. He has no wheezes.  Abdominal: Soft. There is no tenderness. There is no rebound and no guarding.       Hyperactive bowel sounds and diastasis recti present.  Musculoskeletal: Normal range of motion. He exhibits edema (3+ pitting edema).  Neurological: He is alert and oriented to person, place, and time.  Skin: Skin is warm and dry. No rash noted.  Psychiatric: He has a normal mood and affect. His behavior is normal.    ED Course  Procedures (including critical care time)  DIAGNOSTIC STUDIES: Oxygen Saturation is 95% on room air, adequate by my interpretation.    COORDINATION OF CARE:  Date: 12/26/2011  Rate: 67   Rhythm: normal sinus rhythm  QRS Axis: normal  Intervals: 1st degree AV block  ST/T Wave abnormalities: normal  Conduction Disutrbances:none  Narrative Interpretation:   Old EKG Reviewed: unchanged  4:55PM - abd XR, CXR, and blood w/u will be ordered for the pt. 6:30PM - labs and imaging results reviewed; CT angio chest will be ordered for the pt to rule out pulmonary embolism. 9:20PM - imaging results reviewed; imaging results rule out pulmonary embolism; pt will be observed to see if  he can walk around. 9:51PM - recheck; pt states that his abd and HA is improved; pt will receive 1 more dose of Zofran before d/c; pt ready for d/c.  Labs Reviewed  CBC WITH DIFFERENTIAL - Abnormal; Notable for the following:    RDW 16.9 (*)     Platelets 126 (*)     Neutrophils Relative 78 (*)     Neutro Abs 8.0 (*)  All other components within normal limits  BASIC METABOLIC PANEL - Abnormal; Notable for the following:    GFR calc non Af Amer 82 (*)     All other components within normal limits  D-DIMER, QUANTITATIVE - Abnormal; Notable for the following:    D-Dimer, Quant 0.61 (*)     All other components within normal limits  PRO B NATRIURETIC PEPTIDE  LIPASE, BLOOD   Ct Angio Chest Pe W/cm &/or Wo Cm  12/26/2011  *RADIOLOGY REPORT*  Clinical Data: Shortness of breath and elevated D-dimer.  CT ANGIOGRAPHY CHEST  Technique:  Multidetector CT imaging of the chest using the standard protocol during bolus administration of intravenous contrast. Multiplanar reconstructed images including MIPs were obtained and reviewed to evaluate the vascular anatomy.  Contrast: OMNIPAQUE IOHEXOL 350 MG/ML SOLN  Comparison: 05/11/2009.  Findings: The chest wall is unremarkable.  The bony thorax is intact.  The heart is normal in size.  Prominent pericardial and epicardial fat.  No mediastinal or hilar lymphadenopathy.  The esophagus is grossly normal.  The aorta is normal in caliber.  No dissection.  The pulmonary arterial tree is fairly well opacified.  No filling defects to suggest pulmonary emboli.  Examination of the lung parenchyma demonstrates patchy areas of subsegmental atelectasis mainly dependently.  No pleural effusion and/or pulmonary edema.  No focal airspace consolidation.  The upper abdomen is unremarkable.  Diffuse fatty infiltration of the liver is noted.  IMPRESSION:  1.  No CT findings for pulmonary emboli. 2.  Normal thoracic aorta. 3.  Patchy areas of subsegmental atelectasis.   Original Report Authenticated By: P. Loralie Champagne, M.D.   Dg Chest Port 1 View  12/26/2011  *RADIOLOGY REPORT*  Clinical Data: Short of breath, headache, abdominal pain  PORTABLE CHEST - 1 VIEW  Comparison: Most recent prior chest x-ray 07/23/2010  Findings: Slightly increased interstitial opacities in the right lower lobe compared to prior.  This is superimposed on a background of chronic interstitial prominence of the bilateral lung bases. Cardiac and mediastinal silhouettes remain unchanged.  Mild pulmonary vascular congestion persists without significant interval change. No acute osseous findings.  IMPRESSION:  1.  Increased interstitial opacities in the right lung base may represent atelectasis, or developing infiltrate superimposed on underlying chronic changes.  2. Stable mild pulmonary vascular congestion.  Original Report Authenticated By: Alvino Blood Abd 2 Views  12/26/2011  *RADIOLOGY REPORT*  Clinical Data: Epigastric abdominal pain and nausea.  Ex-smoker. Previous cholecystectomy.  History of prostate cancer.  ABDOMEN - 2 VIEW  Comparison: CT dated 07/24/2010.  Findings: Normal bowel gas pattern without free peritoneal air. Cholecystectomy clips.  Lumbar and lower thoracic spine degenerative changes.  IMPRESSION: No acute abnormality.  Original Report Authenticated By: Darrol Angel, M.D.     1. Dyspnea   2. Abdominal  pain, other specified site       MDM  Nonspecific dyspnea, and abdominal pain, suspect gastritis, and gastroesophageal reflux disease. Doubt ACS, PE, pneumonia, or metabolic instability.  Plan: Home Medications- Zofran, Maalox; Home Treatments- gradually advance diet; Recommended follow up- as scheduled with GI in 2 days.  I personally performed the services described in this documentation, which was scribed in my presence. The recorded information has been reviewed and considered.          Flint Melter, MD 12/27/11 [phone]

## 2011-12-26 NOTE — ED Notes (Signed)
Pt c/o sob with headache and nausea. Pt states he feels he is having a flare up of chf, he believes he has gained some weight of the last couple of days. Pt's wife states his stomach is bigger than usual and pt has lower extremity swelling +3 pitting edema.

## 2011-12-26 NOTE — ED Notes (Signed)
Patient has been walking in his room and to restroom.  Patient stated he does not lay in bed and cannot continue to lay on stretcher as he sleeps in recliner.

## 2011-12-27 ENCOUNTER — Encounter: Payer: Self-pay | Admitting: Gastroenterology

## 2011-12-27 ENCOUNTER — Telehealth: Payer: Self-pay | Admitting: *Deleted

## 2011-12-27 ENCOUNTER — Ambulatory Visit (INDEPENDENT_AMBULATORY_CARE_PROVIDER_SITE_OTHER): Payer: Medicare Other | Admitting: Gastroenterology

## 2011-12-27 VITALS — BP 133/75 | HR 76 | Temp 98.1°F | Ht 69.0 in | Wt 300.6 lb

## 2011-12-27 DIAGNOSIS — R197 Diarrhea, unspecified: Secondary | ICD-10-CM | POA: Diagnosis not present

## 2011-12-27 DIAGNOSIS — R1010 Upper abdominal pain, unspecified: Secondary | ICD-10-CM

## 2011-12-27 DIAGNOSIS — R109 Unspecified abdominal pain: Secondary | ICD-10-CM | POA: Diagnosis not present

## 2011-12-27 MED ORDER — ESOMEPRAZOLE MAGNESIUM 40 MG PO CPDR: 40.0000 mg | DELAYED_RELEASE_CAPSULE | Freq: Two times a day (BID) | ORAL | Status: DC

## 2011-12-27 NOTE — Patient Instructions (Addendum)
Please obtain stool studies. Continue Prilosec. Fill the Zofran (nausea medication) that was sent to the pharmacy.  Follow a bland diet, drink plenty of liquids. You may take Kaopectate as needed.  We will send reports from xrays to your primary care doctor.  Monitor for any chest pain, shortness of breath, sweating, feeling weak. Seek medical attention if your symptoms worsen.  Call us tomorrow with a progress report.

## 2011-12-27 NOTE — Telephone Encounter (Signed)
Pt coming in to see AS at 3:00

## 2011-12-27 NOTE — Telephone Encounter (Signed)
ER notes, labs, xray, CT reviewed.  Nothing to explain pain.  +infiltrates may explain SOB.  Pt needs to be reevaluated either in ER, PCP or here.

## 2011-12-27 NOTE — Telephone Encounter (Signed)
[name] [name] called today. He is still in a lot of pain and is scheduled for a procedure tomorrow. He would like a call back ASAP. Thanks.

## 2011-12-27 NOTE — Progress Notes (Signed)
Referring Provider: Cassell Smiles., MD Primary Care Physician:  Cassell Smiles., MD Primary Gastroenterologist: Dr. Jena Gauss   Chief Complaint  Patient presents with  . Abdominal Pain  . Nausea  . Emesis    HPI:   [name] presents today secondary to abdominal pain, nausea, and vomiting. He was just seen in our clinic Aug 2 and set up for hydrogen breath test at that time. Presents today after being seen in the ED yesterday with nausea, decreased appetite, SOB, headache.  CT angiogram without evidence of PE. CXR with increased interstitial opacities in right lung that may represent atelectasis or developing infiltrate superimposed on underlying chronic changes. Stable mild pulmonary vascular congestion.    Notes epigastric pain, moves to above umbilicus, then back up to epigastric area. Feels like ate a whole box of baking soda. Severe pain epigastric region. Feels SOe when pain hits . Hunched over, fedls like a bubble will come up on left side. States swollen last night. Denies diaphoresis. Epigastric pain shooting down to mid abdomen. Started last week when up here. States has seen black, tarry stool. States "chips" and "balls" of bowel movement. States abdomen feels really tight. Hasn't been able to vomit, everything stays in. Dry heaves. +chills. Denies fever but feels cold. No sick contacts. States diarrhea yesterday. None today.     Findings: The chest wall is unremarkable. The bony thorax is  intact. The heart is normal in size. Prominent pericardial and  epicardial fat. No mediastinal or hilar lymphadenopathy. The  esophagus is grossly normal. The aorta is normal in caliber. No  dissection.  The pulmonary arterial tree is fairly well opacified. No filling  defects to suggest pulmonary emboli.  Examination of the lung parenchyma demonstrates patchy areas of  subsegmental atelectasis mainly dependently. No pleural effusion  and/or pulmonary edema. No focal airspace  consolidation.  The upper abdomen is unremarkable. Diffuse fatty infiltration of  the liver is noted.  IMPRESSION:  1. No CT findings for pulmonary emboli.  2. Normal thoracic aorta.  3. Patchy areas of subsegmental atelectasis.  AAS: no acute abnormality  Lipase 31 D-Dimer mildly elevated at 0.61  WBC 10.2, Hgb 13.8, PLTs 126 BNP 31.8 (normal)  Past Medical History  Diagnosis Date  . CHF (congestive heart failure)   . Hypertension   . Obesity   . Hyperlipidemia   . PUD (peptic ulcer disease)   . GERD (gastroesophageal reflux disease)   . S/P endoscopy 2007, 2012    2007: nl, May 2012: antral erosions  . Osteoarthritis   . S/P colonoscopy 2007, Feb and March 2012    hx of adenomas, left-sided diverticula, On 07/2010 TCS, fresh blood and clot noted coming from TI.  Marland Kitchen Prostate cancer     BPH  . Anxiety   . Thyroid disease     hypothyroid  . Ringing in ears   . History of kidney stones   . History of bladder infections   . Heel spur     right heel  . Collagen vascular disease     Past Surgical History  Procedure Date  . Knee arthroscopy   . Rotator cuff repair   . Cholecystectomy   . Lumbar spine surgery   . Small bowel capsule study 07/2011    ?transient focal ischemia in distal small bowel to explain GI bleeding    Current Outpatient Prescriptions  Medication Sig Dispense Refill  . acetaminophen (TYLENOL) 325 MG tablet Take 650 mg by mouth every 6 (six)  hours as needed. Pain      . Dorzolamide HCl-Timolol Mal PF 22.3-6.8 MG/ML SOLN Apply 1 drop to eye 2 (two) times daily. Dorzolamide HCI-Timolol Maleate Ophthalmic Solution 2.23%/0.68%      . furosemide (LASIX) 80 MG tablet Take 80 mg by mouth 2 (two) times daily.       Marland Kitchen HYDROcodone-acetaminophen (NORCO) 10-325 MG per tablet Take 1 tablet by mouth every 6 (six) hours as needed. For Pain. *Do not exceed more than 4 tablets per day*      . latanoprost (XALATAN) 0.005 % ophthalmic solution Place 1 drop into both  eyes at bedtime.      . metoprolol succinate (TOPROL-XL) 25 MG 24 hr tablet Take 12.5 mg by mouth every morning.       Marland Kitchen omeprazole (PRILOSEC) 20 MG capsule Take 1 capsule (20 mg total) by mouth daily. 30 minutes before first meal  30 capsule  5  . ondansetron (ZOFRAN) 8 MG tablet Take 1 tablet (8 mg total) by mouth every 8 (eight) hours as needed for nausea.  20 tablet  0  . OXYGEN-HELIUM IN Inhale 2 mLs into the lungs 2 (two) times daily.      . Probiotic Product (RESTORA) CAPS Take 1 capsule by mouth every morning.      . simvastatin (ZOCOR) 40 MG tablet Take 40 mg by mouth every evening.      . Tamsulosin HCl (FLOMAX) 0.4 MG CAPS Take 0.4 mg by mouth daily after supper.        . trolamine salicylate (ASPERCREME) 10 % cream Apply 1 application topically as needed. *Applied to knee(s)*       No current facility-administered medications for this visit.   Facility-Administered Medications Ordered in Other Visits  Medication Dose Route Frequency Provider Last Rate Last Dose  . acetaminophen (TYLENOL) tablet 650 mg  650 mg Oral Once Flint Melter, MD   650 mg at 12/26/11 2106  . gi cocktail (Maalox,Lidocaine,Donnatal)  30 mL Oral Once Flint Melter, MD   30 mL at 12/26/11 2109  . iohexol (OMNIPAQUE) 350 MG/ML injection 100 mL  100 mL Intravenous Once PRN Medication Radiologist, MD   100 mL at 12/26/11 1946  . ondansetron (ZOFRAN-ODT) disintegrating tablet 8 mg  8 mg Oral Once Flint Melter, MD   8 mg at 12/26/11 2055  . DISCONTD: ondansetron (ZOFRAN-ODT) disintegrating tablet 8 mg  8 mg Oral Once Flint Melter, MD        Allergies as of 12/27/2011 - Review Complete 12/27/2011  Allergen Reaction Noted  . Aspirin Other (See Comments)     Family History  Problem Relation Age of Onset  . Colon cancer Father 2    deceased  . Prostate cancer Father   . Pancreatic cancer Mother 5    deceased    History   Social History  . Marital Status: Married    Spouse Name: N/A    Number  of Children: N/A  . Years of Education: N/A   Social History Main Topics  . Smoking status: Former Smoker    Types: Cigars    Quit date: 08/10/1973  . Smokeless tobacco: Never Used  . Alcohol Use: No  . Drug Use: No  . Sexually Active: None   Other Topics Concern  . None   Social History Narrative  . None    Review of Systems: Gen: SEE HPI  CV: Denies chest pain, palpitations, syncope, peripheral edema, and claudication. Resp: Denies  dyspnea at rest, cough, wheezing, coughing up blood, and pleurisy. GI: Denies vomiting blood, jaundice, and fecal incontinence.   Denies dysphagia or odynophagia. Derm: Denies rash, itching, dry skin Psych: Denies depression, anxiety, memory loss, confusion. No homicidal or suicidal ideation.  Heme: Denies bruising, bleeding, and enlarged lymph nodes.  Physical Exam: BP 133/75  Pulse 76  Temp 98.1 F (36.7 C) (Temporal)  Ht 5\' 9"  (1.753 m)  Wt 300 lb 9.6 oz (136.351 kg)  BMI 44.39 kg/m2 General:   Alert and oriented. Appears uncomfortable, dry heaving.  Head:  Normocephalic and atraumatic. Eyes:  Conjuctiva clear without scleral icterus. Mouth:  Oral mucosa pink and moist.  Lungs: CTAB Heart:  S1, S2 present without murmurs, rubs, or gallops. Regular rate and rhythm. Abdomen:  +BS, soft, protuberant, non-tender and non-distended. No rebound or guarding. No HSM or masses noted. Msk:  Symmetrical without gross deformities. Normal posture. Neurologic:  Alert and  oriented x4;  grossly normal neurologically. Skin:  Intact without significant lesions or rashes. Psych:  Alert and cooperative. Normal mood and affect.

## 2011-12-27 NOTE — Telephone Encounter (Signed)
I called Pt to see what was going on. He is short winded, headaches, abd pain, and his pain is a 10. He went to the ER last night for 7 hours and they sent him home. He said that something has got to change he can not keep going like this. He is nausea and can not eat. Please advise. I can bring him in today if you want me to.

## 2011-12-28 ENCOUNTER — Encounter (HOSPITAL_COMMUNITY): Admission: RE | Payer: Self-pay | Source: Ambulatory Visit

## 2011-12-28 ENCOUNTER — Ambulatory Visit (HOSPITAL_COMMUNITY): Admission: RE | Admit: 2011-12-28 | Payer: Medicare Other | Source: Ambulatory Visit | Admitting: Internal Medicine

## 2011-12-28 ENCOUNTER — Telehealth: Payer: Self-pay

## 2011-12-28 ENCOUNTER — Encounter (HOSPITAL_COMMUNITY): Payer: Medicare Other

## 2011-12-28 SURGERY — BREATH TEST, FOR INTESTINAL BACTERIAL OVERGROWTH

## 2011-12-28 NOTE — Telephone Encounter (Signed)
Called Pt to see how he was doing. He is a little better not short of breath on the phone. He can eat today. He has to take the nausea medication regularly because his is still nausea at times. He sound better when I was talking to him.

## 2011-12-30 ENCOUNTER — Ambulatory Visit (HOSPITAL_COMMUNITY): Payer: Medicare Other | Admitting: Oncology

## 2011-12-30 ENCOUNTER — Encounter (HOSPITAL_COMMUNITY): Payer: Medicare Other | Attending: Oncology | Admitting: Oncology

## 2011-12-30 ENCOUNTER — Encounter (HOSPITAL_COMMUNITY): Payer: Self-pay | Admitting: Oncology

## 2011-12-30 VITALS — BP 133/72 | HR 67 | Temp 97.0°F | Resp 22 | Wt 305.0 lb

## 2011-12-30 DIAGNOSIS — J019 Acute sinusitis, unspecified: Secondary | ICD-10-CM | POA: Diagnosis not present

## 2011-12-30 DIAGNOSIS — D696 Thrombocytopenia, unspecified: Secondary | ICD-10-CM | POA: Diagnosis not present

## 2011-12-30 DIAGNOSIS — R112 Nausea with vomiting, unspecified: Secondary | ICD-10-CM | POA: Diagnosis not present

## 2011-12-30 DIAGNOSIS — R609 Edema, unspecified: Secondary | ICD-10-CM | POA: Diagnosis not present

## 2011-12-30 DIAGNOSIS — I10 Essential (primary) hypertension: Secondary | ICD-10-CM | POA: Diagnosis not present

## 2011-12-30 DIAGNOSIS — E039 Hypothyroidism, unspecified: Secondary | ICD-10-CM | POA: Diagnosis not present

## 2011-12-30 DIAGNOSIS — R110 Nausea: Secondary | ICD-10-CM

## 2011-12-30 DIAGNOSIS — R197 Diarrhea, unspecified: Secondary | ICD-10-CM | POA: Diagnosis not present

## 2011-12-30 HISTORY — DX: Thrombocytopenia, unspecified: D69.6

## 2011-12-30 MED ORDER — PROCHLORPERAZINE MALEATE 10 MG PO TABS
10.0000 mg | ORAL_TABLET | Freq: Once | ORAL | Status: AC
  Administered 2011-12-30: 10 mg via ORAL
  Filled 2011-12-30: qty 1

## 2011-12-30 NOTE — Progress Notes (Signed)
Cassell Smiles., MD [address] [place] [address]  1. Thrombocytopenia     CURRENT THERAPY: Observation with lab work  INTERVAL HISTORY: [name] [name]. 71 y.o. male returns for  regular  visit for followup of intermittent thrombocytopenia and mild leukocytosis.  I personally reviewed and went over laboratory results with the patient.  His platelet count remains stable at 126,000 and his WBC count is within normal limits.  So we will perform lab work again in six months.  He will then return at that time for follow-up.  Our orders for lab work recently were cancelled because he had the lab work performed while in the ED recently.   The patient reports that he was having intermittent diarrhea and it progressive lead to abdominal pain that was so bad it caused him to go to the ED.  ED work-up was impressive.  He describes his pain as epigastric with nausea.  No vomiting, but dry heaving.  He was given Zofran by ED and this is mildly effective for him.  I will give him Compazine 10 mg PO now to hopefully help with his nausea while he goes to see his PCP in a few hours.   Hematologically, the patient denies any complaints.    Past Medical History  Diagnosis Date  . CHF (congestive heart failure)   . Hypertension   . Obesity   . Hyperlipidemia   . PUD (peptic ulcer disease)   . GERD (gastroesophageal reflux disease)   . S/P endoscopy 2007, 2012    2007: nl, May 2012: antral erosions  . Osteoarthritis   . S/P colonoscopy 2007, Feb and March 2012    hx of adenomas, left-sided diverticula, On 07/2010 TCS, fresh blood and clot noted coming from TI.  Marland Kitchen Prostate cancer     BPH  . Anxiety   . Thyroid disease     hypothyroid  . Ringing in ears   . History of kidney stones   . History of bladder infections   . Heel spur     right heel  . Collagen vascular disease   . Thrombocytopenia 12/30/2011    Stable    has HYPERTENSION; GASTROESOPHAGEAL REFLUX  DISEASE, CHRONIC; FATTY LIVER DISEASE; CHOLELITHIASIS, SYMPTOMATIC; RENAL INSUFFICIENCY; ABDOMINAL BLOATING; ABDOMINAL PAIN; COLONIC POLYPS, ADENOMATOUS, HX OF; NAUSEA; DIARRHEA; Rectal bleeding; Constipation; Hypothyroidism; Upper abdominal pain; Anorexia; Bowel habit changes; and Thrombocytopenia on his problem list.     is allergic to aspirin.  Mr. Payne does not currently have medications on file.  Past Surgical History  Procedure Date  . Knee arthroscopy   . Rotator cuff repair   . Cholecystectomy   . Lumbar spine surgery   . Small bowel capsule study 07/2011    ?transient focal ischemia in distal small bowel to explain GI bleeding    Denies any headaches, dizziness, double vision, fevers, chills, night sweats, constipation, chest pain, heart palpitations, shortness of breath, blood in stool, black tarry stool, urinary pain, urinary burning, urinary frequency, hematuria.   PHYSICAL EXAMINATION  ECOG PERFORMANCE STATUS: 1 - Symptomatic but completely ambulatory  Filed Vitals:   12/30/11 1321  BP: 133/72  Pulse: 67  Temp: 97 F (36.1 C)  Resp: 22    GENERAL:alert, well developed, cooperative, obese and smiling, uncomfortable, dry heaving episode in the exam room bathroom before my arrival. SKIN: skin color, texture, turgor are normal, no rashes or significant lesions HEAD: Normocephalic, No masses, lesions, tenderness or abnormalities EYES: normal, Conjunctiva  are pink and non-injected EARS: External ears normal OROPHARYNX:lips, buccal mucosa, and tongue normal and mucous membranes are moist  NECK: supple, trachea midline LYMPH:  not examined BREAST:not examined LUNGS: clear to auscultation  HEART: regular rate & rhythm, no murmurs and no gallops ABDOMEN:abdomen soft, obese, normal bowel sounds and epigastric pain BACK: Back symmetric, no curvature. EXTREMITIES:less then 2 second capillary refill, no clubbing, no cyanosis, positive findings:  edema B/L 3+ pitting edema   NEURO: alert & oriented x 3 with fluent speech, no focal motor/sensory deficits, gait normal    LABORATORY DATA: CBC    Component Value Date/Time   WBC 10.2 12/26/2011 1609   RBC 5.31 12/26/2011 1609   HGB 13.8 12/26/2011 1609   HCT 41.6 12/26/2011 1609   PLT 126* 12/26/2011 1609   MCV 78.3 12/26/2011 1609   MCH 26.0 12/26/2011 1609   MCHC 33.2 12/26/2011 1609   RDW 16.9* 12/26/2011 1609   LYMPHSABS 1.4 12/26/2011 1609   MONOABS 0.7 12/26/2011 1609   EOSABS 0.1 12/26/2011 1609   BASOSABS 0.0 12/26/2011 1609       ASSESSMENT:  1. Intermittent thrombocytopenia.  2. Mild leukocytosis which is improved since September.  3. Nausea, vomiting, epigastric pain which landed him in the ER.  He will follow-up with Dr. Sherwood Gambler, PCP, this afternoon.  PLAN:  1. Encouraged the patient to follow-up with PCP this afternoon as scheduled for his GI symptoms.  2. I personally reviewed and went over laboratory results with the patient. 3. Lab work in 6 months: CBC diff 4. Compazine 10 mg PO now to help with nausea. 5. Return in 6 months for follow-up.   All questions were answered. The patient knows to call the clinic with any problems, questions or concerns. We can certainly see the patient much sooner if necessary.  [name]

## 2011-12-30 NOTE — Patient Instructions (Signed)
[facility]  Discharge Instructions Brodee Mauritz  [id number] Apr 03, 1941 Dr. Glenford Peers  RECOMMENDATIONS MADE BY THE CONSULTANT AND ANY TEST RESULTS WILL BE SENT TO YOUR REFERRING DOCTOR.   EXAM FINDINGS BY MD TODAY AND SIGNS AND SYMPTOMS TO REPORT TO CLINIC OR PRIMARY MD: labs stable  MEDICATIONS PRESCRIBED: compazine 10 mg given po while here   INSTRUCTIONS GIVEN AND DISCUSSED: Labs in 6 months  SPECIAL INSTRUCTIONS/FOLLOW-UP: Return to Clinic in 6 months   I acknowledge that I have been informed and understand all the instructions given to me and received a copy. I do not have any more questions at this time, but understand that I may call the Specialty Clinic at [facility] at [phone] during business hours should I have any further questions or need assistance in obtaining follow-up care.    __________________________________________  _____________  __________ Signature of Patient or Authorized Representative            Date                   Time    __________________________________________ Nurse's Signature

## 2011-12-31 NOTE — Assessment & Plan Note (Signed)
Epigastric discomfort associated with nausea, vomiting, dry heaves. Acute. Question viral etiology at this point. Labs unrevealing. CT angiogram performed due to reports of SOB. Negative for PE. ?of evolving pneumonia vs atelectasis on CXR. Pt afebrile, VSS, will send CXR to PCP for their records and instruct pt to seek medical attention if any respiratory changes. Complaints of SOB seem to occur with pain discomfort. No chest pain.   Bland diet for now Fill zofran that was prescribed from ED Progress report tomorrow Send CXR, CTA to PCP To ED if symptoms worsen, chest pain, diaphoresis

## 2011-12-31 NOTE — Assessment & Plan Note (Signed)
Stool studies. Question self-limiting process. Progress report tomorrow. Bland diet, kaopectate as needed. Cancel HBT and reschedule once current episode resolves.

## 2012-01-02 LAB — CLOSTRIDIUM DIFFICILE BY PCR: Toxigenic C. Difficile by PCR: NOT DETECTED

## 2012-01-02 NOTE — Progress Notes (Signed)
Quick Note:  Thus far stool studies negative.  Let's have patient come back in about 2 weeks to discuss setting up HBT again. ______

## 2012-01-02 NOTE — Progress Notes (Signed)
Faxed to PCP

## 2012-01-03 LAB — STOOL CULTURE

## 2012-01-03 NOTE — Progress Notes (Signed)
Quick Note:  Pt aware ______ 

## 2012-01-04 NOTE — Telephone Encounter (Signed)
Good to hear. Pt to return in 1-2 weeks.

## 2012-01-09 DIAGNOSIS — M1710 Unilateral primary osteoarthritis, unspecified knee: Secondary | ICD-10-CM | POA: Diagnosis not present

## 2012-01-09 DIAGNOSIS — M545 Low back pain, unspecified: Secondary | ICD-10-CM | POA: Diagnosis not present

## 2012-01-09 DIAGNOSIS — G894 Chronic pain syndrome: Secondary | ICD-10-CM | POA: Diagnosis not present

## 2012-01-09 DIAGNOSIS — M25569 Pain in unspecified knee: Secondary | ICD-10-CM | POA: Diagnosis not present

## 2012-01-09 DIAGNOSIS — M47817 Spondylosis without myelopathy or radiculopathy, lumbosacral region: Secondary | ICD-10-CM | POA: Diagnosis not present

## 2012-01-09 DIAGNOSIS — IMO0002 Reserved for concepts with insufficient information to code with codable children: Secondary | ICD-10-CM | POA: Diagnosis not present

## 2012-01-17 ENCOUNTER — Ambulatory Visit (INDEPENDENT_AMBULATORY_CARE_PROVIDER_SITE_OTHER): Payer: Medicare Other | Admitting: Gastroenterology

## 2012-01-17 ENCOUNTER — Encounter: Payer: Self-pay | Admitting: Gastroenterology

## 2012-01-17 VITALS — BP 131/80 | HR 63 | Temp 97.4°F | Ht 68.0 in | Wt 286.8 lb

## 2012-01-17 DIAGNOSIS — R110 Nausea: Secondary | ICD-10-CM

## 2012-01-17 DIAGNOSIS — R109 Unspecified abdominal pain: Secondary | ICD-10-CM

## 2012-01-17 DIAGNOSIS — R197 Diarrhea, unspecified: Secondary | ICD-10-CM

## 2012-01-17 DIAGNOSIS — R630 Anorexia: Secondary | ICD-10-CM

## 2012-01-17 MED ORDER — ONDANSETRON HCL 4 MG PO TABS: 4.0000 mg | ORAL_TABLET | Freq: Three times a day (TID) | ORAL | Status: AC | PRN

## 2012-01-17 NOTE — Progress Notes (Signed)
Referring Provider: Cassell Smiles., MD Primary Care Physician:  Cassell Smiles., MD Primary Gastroenterologist: Dr. Jena Gauss  Chief Complaint  Patient presents with  . Follow-up  . Abdominal Pain  . Nausea    HPI:   71 year old male with hx of chronic nausea, abdominal bloating, pain ,diarrhea. Extensive work-up completed. GES normal. EGD and TCS on file from last year. Serial CT scans performed. Last seen by myself due to what was thought to be acute illness with negative stool studies. He had improved, but then returns today with similar complaints. Appears he cycles through times of "feeling good" and times of nausea, abdominal pain, and diarrhea.  Diarrhea improved but not completely resolved. Nausea constant, +headache. No throwing up. Feels like he wants to throw up. Notes epigastric abdominal pain that radiates down stomach. Not worsened with eating. Doesn't want to eat. Decreased appetite.  Not eating much. Wt 286. He has lost 14 lbs in less than a month.  Feels weak, tired. Dr. Sherwood Gambler placed on abx for sinus infection recently, finished about 3 days ago. Feels SOB, dizzy at times. Notes chronic issue for a couple of years.  HBT cancelled a few weeks ago due to acute illness. Wife present today, asking if they can be referred for a second opinion due to chronicity of issues.    Past Medical History  Diagnosis Date  . CHF (congestive heart failure)   . Hypertension   . Obesity   . Hyperlipidemia   . PUD (peptic ulcer disease)   . GERD (gastroesophageal reflux disease)   . S/P endoscopy 2007, 2012    2007: nl, May 2012: antral erosions  . Osteoarthritis   . S/P colonoscopy 2007, Feb and March 2012    hx of adenomas, left-sided diverticula, On 07/2010 TCS, fresh blood and clot noted coming from TI.  Marland Kitchen Prostate cancer     BPH  . Anxiety   . Thyroid disease     hypothyroid  . Ringing in ears   . History of kidney stones   . History of bladder infections   . Heel spur    right heel  . Collagen vascular disease   . Thrombocytopenia 12/30/2011    Stable    Past Surgical History  Procedure Date  . Knee arthroscopy   . Rotator cuff repair   . Cholecystectomy   . Lumbar spine surgery   . Small bowel capsule study 07/2011    ?transient focal ischemia in distal small bowel to explain GI bleeding    Current Outpatient Prescriptions  Medication Sig Dispense Refill  . acetaminophen (TYLENOL) 325 MG tablet Take 650 mg by mouth every 6 (six) hours as needed. Pain      . Dorzolamide HCl-Timolol Mal PF 22.3-6.8 MG/ML SOLN Apply 1 drop to eye 2 (two) times daily. Dorzolamide HCI-Timolol Maleate Ophthalmic Solution 2.23%/0.68%      . furosemide (LASIX) 80 MG tablet Take 80 mg by mouth daily.       Marland Kitchen HYDROcodone-acetaminophen (NORCO) 10-325 MG per tablet Take 1 tablet by mouth every 6 (six) hours as needed. For Pain. *Do not exceed more than 4 tablets per day*      . latanoprost (XALATAN) 0.005 % ophthalmic solution Place 1 drop into both eyes at bedtime.      . metoprolol succinate (TOPROL-XL) 25 MG 24 hr tablet Take 12.5 mg by mouth every morning.       Marland Kitchen omeprazole (PRILOSEC) 20 MG capsule Take 1 capsule (20 mg total) by  mouth daily. 30 minutes before first meal  30 capsule  5  . OXYGEN-HELIUM IN Inhale 2 mLs into the lungs 2 (two) times daily.      . Probiotic Product (RESTORA) CAPS Take 1 capsule by mouth every morning.      . simvastatin (ZOCOR) 40 MG tablet Take 40 mg by mouth every evening.      . Tamsulosin HCl (FLOMAX) 0.4 MG CAPS Take 0.4 mg by mouth daily after supper.        . trolamine salicylate (ASPERCREME) 10 % cream Apply 1 application topically as needed. *Applied to knee(s)*        Allergies as of 01/17/2012 - Review Complete 01/17/2012  Allergen Reaction Noted  . Aspirin Other (See Comments)     Family History  Problem Relation Age of Onset  . Colon cancer Father 37    deceased  . Prostate cancer Father   . Pancreatic cancer Mother 91     deceased    History   Social History  . Marital Status: Married    Spouse Name: N/A    Number of Children: N/A  . Years of Education: N/A   Social History Main Topics  . Smoking status: Former Smoker    Types: Cigars    Quit date: 08/10/1973  . Smokeless tobacco: Never Used  . Alcohol Use: No  . Drug Use: No  . Sexually Active: None   Other Topics Concern  . None   Social History Narrative  . None    Review of Systems: Gen: SEE HPI CV: Denies chest pain, palpitations, syncope, peripheral edema, and claudication. Resp: Denies dyspnea at rest, cough, wheezing, coughing up blood, and pleurisy. GI: Denies vomiting blood, jaundice, and fecal incontinence.   Denies dysphagia or odynophagia. Derm: Denies rash, itching, dry skin Psych: Denies depression, anxiety, memory loss, confusion. No homicidal or suicidal ideation.  Heme: Denies bruising, bleeding, and enlarged lymph nodes.  Physical Exam: BP 131/80  Pulse 63  Temp 97.4 F (36.3 C) (Temporal)  Ht 5\' 8"  (1.727 m)  Wt 286 lb 12.8 oz (130.092 kg)  BMI 43.61 kg/m2 General:   Alert and oriented. No distress noted. Pleasant and cooperative.  Head:  Normocephalic and atraumatic. Eyes:  Conjuctiva clear without scleral icterus. Mouth:  Oral mucosa pink and moist. Good dentition. No lesions. Heart:  S1, S2 present without murmurs, rubs, or gallops. Regular rate and rhythm. Abdomen:  +BS, soft, mildy TTP epigastric, RUQ/LUQ Msk:  Symmetrical without gross deformities. Normal posture. Extremities:  2+ edema Neurologic:  Alert and  oriented x4;  grossly normal neurologically. Skin:  Intact without significant lesions or rashes. Cervical Nodes:  No significant cervical adenopathy. Psych:  Alert and cooperative. Normal mood and affect.

## 2012-01-17 NOTE — Patient Instructions (Addendum)
Continue to take Zofran as needed for nausea.   I will be in touch with you tomorrow with the next step.

## 2012-01-18 NOTE — Assessment & Plan Note (Signed)
71 year old male with chronic abdominal pain, nausea, bloating, diarrhea. He has had an extensive work-up as mentioned in HPI and prior notes. He has also lost about 14 lbs in less than a month, attributing this to decreased appetite, po intake, constant nausea. Pain is not related to eating. HBT was recommended, but we cancelled this due to acute illness a few weeks ago. Thus far, no evidence for gastroparesis with normal GES, TCS/EGD up-to-date, gallbladder absent, multiple CTs on file. Doubt dealing with mesenteric ischemia, as his symptoms do not seem to fall in that realm. Wife is present today and asking if a second opinion may be necessary.  Discussed possibly repeating EGD due to epigastric pain, nausea, wt loss vs HBT vs empiric treatment for SBBO. At this point, his clinical picture is not consistent with just one entity. I told the patient and his wife that I would discuss with Dr. Jena Gauss the next step. They were agreeable to this. Refill for Zofran provided.

## 2012-01-19 DIAGNOSIS — Z049 Encounter for examination and observation for unspecified reason: Secondary | ICD-10-CM | POA: Diagnosis not present

## 2012-01-19 DIAGNOSIS — I10 Essential (primary) hypertension: Secondary | ICD-10-CM | POA: Diagnosis not present

## 2012-01-19 DIAGNOSIS — D131 Benign neoplasm of stomach: Secondary | ICD-10-CM | POA: Diagnosis not present

## 2012-01-19 DIAGNOSIS — G4730 Sleep apnea, unspecified: Secondary | ICD-10-CM | POA: Diagnosis not present

## 2012-01-19 DIAGNOSIS — K3189 Other diseases of stomach and duodenum: Secondary | ICD-10-CM | POA: Diagnosis not present

## 2012-01-19 DIAGNOSIS — R112 Nausea with vomiting, unspecified: Secondary | ICD-10-CM | POA: Diagnosis not present

## 2012-01-19 DIAGNOSIS — R109 Unspecified abdominal pain: Secondary | ICD-10-CM | POA: Diagnosis not present

## 2012-01-19 DIAGNOSIS — R1013 Epigastric pain: Secondary | ICD-10-CM | POA: Diagnosis not present

## 2012-01-19 DIAGNOSIS — R197 Diarrhea, unspecified: Secondary | ICD-10-CM | POA: Diagnosis not present

## 2012-01-19 DIAGNOSIS — K921 Melena: Secondary | ICD-10-CM | POA: Diagnosis not present

## 2012-01-19 DIAGNOSIS — I509 Heart failure, unspecified: Secondary | ICD-10-CM | POA: Diagnosis not present

## 2012-01-19 DIAGNOSIS — R0602 Shortness of breath: Secondary | ICD-10-CM | POA: Diagnosis not present

## 2012-01-19 DIAGNOSIS — Z79899 Other long term (current) drug therapy: Secondary | ICD-10-CM | POA: Diagnosis not present

## 2012-01-20 DIAGNOSIS — R0602 Shortness of breath: Secondary | ICD-10-CM | POA: Diagnosis not present

## 2012-01-20 DIAGNOSIS — K3189 Other diseases of stomach and duodenum: Secondary | ICD-10-CM | POA: Diagnosis not present

## 2012-01-24 NOTE — Progress Notes (Signed)
Faxed to PCP

## 2012-01-25 ENCOUNTER — Telehealth: Payer: Self-pay | Admitting: Gastroenterology

## 2012-01-25 NOTE — Telephone Encounter (Signed)
Pt called, stated he had been to [facility] due to symptoms. Reports EGD performed at that time.  Can we try and retrieve these results?   Once obtained, likely setting up for HBT.

## 2012-01-26 NOTE — Telephone Encounter (Signed)
Request faxed to [facility] with signed release

## 2012-01-26 NOTE — Telephone Encounter (Signed)
Spoke with [name] [name] today, he will need to come to the office to fill out a release of information form for the results from [facility].  He will be in to do that.

## 2012-01-31 MED ORDER — AMOXICILLIN-POT CLAVULANATE 875-125 MG PO TABS: 2.0000 | ORAL_TABLET | Freq: Two times a day (BID) | ORAL | Status: AC

## 2012-01-31 NOTE — Progress Notes (Addendum)
Spoke with Dr. Jena Gauss. As pt underwent EGD at [facility] about a week ago, obtaining results. As of this note, do not have. HOWEVER, will treat empirically for possible SBBO. Rifaximin, Augmentin, Flagyl with Bactrim all recommended per current literature recommendations. Rifaximin is usually extremely expensive. Will send in Augmentin 30mg /kg/day X 10 days. May ultimately need to cycle this each month if symptoms continue to recur, alternating abx regimens if necessary to decrease resistance.   Please let pt know we are calling in abx to see how this helps his symptoms. No need for HBT at this time.  Follow-up in 6 weeks.

## 2012-01-31 NOTE — Telephone Encounter (Signed)
[name] [name], Please let Mr. Rajewski know we are treating him for SBBO empirically. I sent a prescription for Augmentin X 10 days to his pharmacy and routed my addendum to you. Let him know I have requested the EGD reports from [facility]. Continue to take probiotic daily. Contact us if any issues. Return in 6 weeks.

## 2012-01-31 NOTE — Addendum Note (Signed)
Addended by: Nira Retort on: 01/31/2012 10:44 AM   Modules accepted: Orders

## 2012-02-01 NOTE — Progress Notes (Signed)
Pt is aware. Darl Pikes please schedule ov in 6 weeks.

## 2012-02-01 NOTE — Telephone Encounter (Signed)
Pt aware,  Darl Pikes, pt needs ov in 6 weeks.

## 2012-02-02 NOTE — Telephone Encounter (Signed)
appt made for 6 week OV

## 2012-02-06 DIAGNOSIS — N400 Enlarged prostate without lower urinary tract symptoms: Secondary | ICD-10-CM | POA: Diagnosis not present

## 2012-02-06 NOTE — Telephone Encounter (Signed)
Received records today

## 2012-02-07 NOTE — Progress Notes (Signed)
Pt is aware of OV on 10/23 at 130 with AS

## 2012-02-13 ENCOUNTER — Encounter: Payer: Self-pay | Admitting: Gastroenterology

## 2012-02-13 NOTE — Progress Notes (Unsigned)
Received op note. EGD normal, small polyp noted.   Dawn, can we request the path report? Thanks!  Pt to keep appt upcoming. I have added this procedure in the [facility] for future reference. Op note to be scanned.

## 2012-02-14 NOTE — Progress Notes (Signed)
I will contact [facility] and see if I can find it

## 2012-02-14 NOTE — Progress Notes (Signed)
Faxed request today 

## 2012-02-15 NOTE — Progress Notes (Signed)
Received fax today, its in your box

## 2012-02-23 DIAGNOSIS — G4733 Obstructive sleep apnea (adult) (pediatric): Secondary | ICD-10-CM | POA: Diagnosis not present

## 2012-02-23 DIAGNOSIS — I5022 Chronic systolic (congestive) heart failure: Secondary | ICD-10-CM | POA: Diagnosis not present

## 2012-02-23 DIAGNOSIS — E669 Obesity, unspecified: Secondary | ICD-10-CM | POA: Diagnosis not present

## 2012-02-23 DIAGNOSIS — I872 Venous insufficiency (chronic) (peripheral): Secondary | ICD-10-CM | POA: Diagnosis not present

## 2012-02-23 NOTE — Progress Notes (Signed)
REVIEWED.  

## 2012-02-27 ENCOUNTER — Inpatient Hospital Stay (HOSPITAL_COMMUNITY): Payer: Medicare Other

## 2012-02-27 ENCOUNTER — Emergency Department (HOSPITAL_COMMUNITY): Payer: Medicare Other

## 2012-02-27 ENCOUNTER — Encounter (HOSPITAL_COMMUNITY): Payer: Self-pay | Admitting: *Deleted

## 2012-02-27 ENCOUNTER — Inpatient Hospital Stay (HOSPITAL_COMMUNITY)
Admission: EM | Admit: 2012-02-27 | Discharge: 2012-03-02 | DRG: 872 | Disposition: A | Discharge status: Home / Self Care | Payer: Medicare Other | Attending: Internal Medicine | Admitting: Internal Medicine

## 2012-02-27 DIAGNOSIS — R1010 Upper abdominal pain, unspecified: Secondary | ICD-10-CM

## 2012-02-27 DIAGNOSIS — D696 Thrombocytopenia, unspecified: Secondary | ICD-10-CM | POA: Diagnosis not present

## 2012-02-27 DIAGNOSIS — R141 Gas pain: Secondary | ICD-10-CM

## 2012-02-27 DIAGNOSIS — N259 Disorder resulting from impaired renal tubular function, unspecified: Secondary | ICD-10-CM

## 2012-02-27 DIAGNOSIS — Z6841 Body Mass Index (BMI) 40.0 and over, adult: Secondary | ICD-10-CM | POA: Diagnosis not present

## 2012-02-27 DIAGNOSIS — N200 Calculus of kidney: Secondary | ICD-10-CM | POA: Diagnosis not present

## 2012-02-27 DIAGNOSIS — E039 Hypothyroidism, unspecified: Secondary | ICD-10-CM | POA: Diagnosis present

## 2012-02-27 DIAGNOSIS — Z96659 Presence of unspecified artificial knee joint: Secondary | ICD-10-CM

## 2012-02-27 DIAGNOSIS — R079 Chest pain, unspecified: Secondary | ICD-10-CM | POA: Diagnosis not present

## 2012-02-27 DIAGNOSIS — R059 Cough, unspecified: Secondary | ICD-10-CM | POA: Diagnosis not present

## 2012-02-27 DIAGNOSIS — R198 Other specified symptoms and signs involving the digestive system and abdomen: Secondary | ICD-10-CM | POA: Diagnosis not present

## 2012-02-27 DIAGNOSIS — K5900 Constipation, unspecified: Secondary | ICD-10-CM

## 2012-02-27 DIAGNOSIS — D72829 Elevated white blood cell count, unspecified: Secondary | ICD-10-CM | POA: Diagnosis present

## 2012-02-27 DIAGNOSIS — R6510 Systemic inflammatory response syndrome (SIRS) of non-infectious origin without acute organ dysfunction: Secondary | ICD-10-CM | POA: Diagnosis not present

## 2012-02-27 DIAGNOSIS — K625 Hemorrhage of anus and rectum: Secondary | ICD-10-CM

## 2012-02-27 DIAGNOSIS — R05 Cough: Secondary | ICD-10-CM | POA: Diagnosis not present

## 2012-02-27 DIAGNOSIS — R300 Dysuria: Secondary | ICD-10-CM | POA: Diagnosis not present

## 2012-02-27 DIAGNOSIS — K219 Gastro-esophageal reflux disease without esophagitis: Secondary | ICD-10-CM

## 2012-02-27 DIAGNOSIS — K7689 Other specified diseases of liver: Secondary | ICD-10-CM

## 2012-02-27 DIAGNOSIS — Z79899 Other long term (current) drug therapy: Secondary | ICD-10-CM

## 2012-02-27 DIAGNOSIS — R609 Edema, unspecified: Secondary | ICD-10-CM

## 2012-02-27 DIAGNOSIS — I10 Essential (primary) hypertension: Secondary | ICD-10-CM | POA: Diagnosis not present

## 2012-02-27 DIAGNOSIS — Z23 Encounter for immunization: Secondary | ICD-10-CM | POA: Diagnosis not present

## 2012-02-27 DIAGNOSIS — Z87442 Personal history of urinary calculi: Secondary | ICD-10-CM

## 2012-02-27 DIAGNOSIS — M1990 Unspecified osteoarthritis, unspecified site: Secondary | ICD-10-CM | POA: Diagnosis present

## 2012-02-27 DIAGNOSIS — K8020 Calculus of gallbladder without cholecystitis without obstruction: Secondary | ICD-10-CM

## 2012-02-27 DIAGNOSIS — F419 Anxiety disorder, unspecified: Secondary | ICD-10-CM | POA: Diagnosis present

## 2012-02-27 DIAGNOSIS — I509 Heart failure, unspecified: Secondary | ICD-10-CM | POA: Diagnosis present

## 2012-02-27 DIAGNOSIS — N400 Enlarged prostate without lower urinary tract symptoms: Secondary | ICD-10-CM | POA: Diagnosis present

## 2012-02-27 DIAGNOSIS — F411 Generalized anxiety disorder: Secondary | ICD-10-CM | POA: Diagnosis present

## 2012-02-27 DIAGNOSIS — A419 Sepsis, unspecified organism: Secondary | ICD-10-CM | POA: Diagnosis present

## 2012-02-27 DIAGNOSIS — K279 Peptic ulcer, site unspecified, unspecified as acute or chronic, without hemorrhage or perforation: Secondary | ICD-10-CM | POA: Diagnosis present

## 2012-02-27 DIAGNOSIS — E669 Obesity, unspecified: Secondary | ICD-10-CM | POA: Diagnosis not present

## 2012-02-27 DIAGNOSIS — E785 Hyperlipidemia, unspecified: Secondary | ICD-10-CM | POA: Diagnosis present

## 2012-02-27 DIAGNOSIS — N289 Disorder of kidney and ureter, unspecified: Secondary | ICD-10-CM | POA: Diagnosis not present

## 2012-02-27 DIAGNOSIS — R0602 Shortness of breath: Secondary | ICD-10-CM | POA: Diagnosis not present

## 2012-02-27 DIAGNOSIS — Z860100 Personal history of colon polyps, unspecified: Secondary | ICD-10-CM

## 2012-02-27 DIAGNOSIS — Z87891 Personal history of nicotine dependence: Secondary | ICD-10-CM | POA: Diagnosis not present

## 2012-02-27 DIAGNOSIS — R110 Nausea: Secondary | ICD-10-CM

## 2012-02-27 DIAGNOSIS — A498 Other bacterial infections of unspecified site: Secondary | ICD-10-CM | POA: Diagnosis present

## 2012-02-27 DIAGNOSIS — R109 Unspecified abdominal pain: Secondary | ICD-10-CM | POA: Diagnosis present

## 2012-02-27 DIAGNOSIS — N390 Urinary tract infection, site not specified: Secondary | ICD-10-CM

## 2012-02-27 DIAGNOSIS — R194 Change in bowel habit: Secondary | ICD-10-CM

## 2012-02-27 DIAGNOSIS — R197 Diarrhea, unspecified: Secondary | ICD-10-CM

## 2012-02-27 DIAGNOSIS — R630 Anorexia: Secondary | ICD-10-CM

## 2012-02-27 HISTORY — DX: Systemic inflammatory response syndrome (sirs) of non-infectious origin without acute organ dysfunction: R65.10

## 2012-02-27 HISTORY — DX: Benign prostatic hyperplasia without lower urinary tract symptoms: N40.0

## 2012-02-27 LAB — URINE MICROSCOPIC-ADD ON

## 2012-02-27 LAB — COMPREHENSIVE METABOLIC PANEL
Albumin: 3 g/dL — ABNORMAL LOW (ref 3.5–5.2)
Alkaline Phosphatase: 67 U/L (ref 39–117)
BUN: 12 mg/dL (ref 6–23)
Creatinine, Ser: 1.07 mg/dL (ref 0.50–1.35)
GFR calc Af Amer: 78 mL/min — ABNORMAL LOW (ref >90)
Glucose, Bld: 92 mg/dL (ref 70–99)
Potassium: 3.9 mEq/L (ref 3.5–5.1)
Total Bilirubin: 1.1 mg/dL (ref 0.3–1.2)
Total Protein: 6.4 g/dL (ref 6.0–8.3)

## 2012-02-27 LAB — URINALYSIS, ROUTINE W REFLEX MICROSCOPIC
Bilirubin Urine: NEGATIVE
Ketones, ur: NEGATIVE mg/dL
Nitrite: NEGATIVE
Protein, ur: NEGATIVE mg/dL
Specific Gravity, Urine: 1.01 (ref 1.005–1.030)
Urobilinogen, UA: 0.2 mg/dL (ref 0.0–1.0)

## 2012-02-27 LAB — CBC WITH DIFFERENTIAL/PLATELET
Eosinophils Absolute: 0 10*3/uL (ref 0.0–0.7)
Eosinophils Relative: 0 % (ref 0–5)
Lymphs Abs: 1.3 10*3/uL (ref 0.7–4.0)
MCH: 26.7 pg (ref 26.0–34.0)
MCHC: 33.1 g/dL (ref 30.0–36.0)
MCV: 80.5 fL (ref 78.0–100.0)
Monocytes Absolute: 1.3 10*3/uL — ABNORMAL HIGH (ref 0.1–1.0)
Neutrophils Relative %: 88 % — ABNORMAL HIGH (ref 43–77)
Platelets: 100 10*3/uL — ABNORMAL LOW (ref 150–400)
RBC: 5.59 MIL/uL (ref 4.22–5.81)
RDW: 16.6 % — ABNORMAL HIGH (ref 11.5–15.5)

## 2012-02-27 LAB — LACTIC ACID, PLASMA: Lactic Acid, Venous: 1.2 mmol/L (ref 0.5–2.2)

## 2012-02-27 LAB — LIPASE, BLOOD: Lipase: 19 U/L (ref 11–59)

## 2012-02-27 MED ORDER — DORZOLAMIDE HCL 2 % OP SOLN
1.0000 [drp] | Freq: Two times a day (BID) | OPHTHALMIC | Status: DC
  Administered 2012-02-28 – 2012-03-02 (×6): 1 [drp] via OPHTHALMIC
  Filled 2012-02-27 (×2): qty 10

## 2012-02-27 MED ORDER — ENOXAPARIN SODIUM 60 MG/0.6ML ~~LOC~~ SOLN
60.0000 mg | SUBCUTANEOUS | Status: DC
  Administered 2012-02-27 – 2012-02-29 (×3): 60 mg via SUBCUTANEOUS
  Filled 2012-02-27 (×3): qty 0.6

## 2012-02-27 MED ORDER — TROLAMINE SALICYLATE 10 % EX CREA: 1.0000 "application " | TOPICAL_CREAM | CUTANEOUS | Status: DC | PRN

## 2012-02-27 MED ORDER — LATANOPROST 0.005 % OP SOLN
1.0000 [drp] | Freq: Every day | OPHTHALMIC | Status: DC
  Administered 2012-02-28 – 2012-03-01 (×3): 1 [drp] via OPHTHALMIC
  Filled 2012-02-27: qty 2.5

## 2012-02-27 MED ORDER — MUSCLE RUB 10-15 % EX CREA
TOPICAL_CREAM | CUTANEOUS | Status: DC | PRN
  Administered 2012-02-29: 1 via TOPICAL
  Filled 2012-02-27: qty 85

## 2012-02-27 MED ORDER — SENNOSIDES-DOCUSATE SODIUM 8.6-50 MG PO TABS: 1.0000 | ORAL_TABLET | Freq: Every day | ORAL | Status: DC

## 2012-02-27 MED ORDER — OXYCODONE HCL 5 MG PO TABS
5.0000 mg | ORAL_TABLET | ORAL | Status: DC | PRN
  Administered 2012-02-28 – 2012-03-01 (×3): 5 mg via ORAL
  Filled 2012-02-27 (×3): qty 1

## 2012-02-27 MED ORDER — ACETAMINOPHEN 325 MG PO TABS
650.0000 mg | ORAL_TABLET | ORAL | Status: DC | PRN
  Administered 2012-02-27 – 2012-02-29 (×4): 650 mg via ORAL
  Filled 2012-02-27 (×4): qty 2

## 2012-02-27 MED ORDER — TIMOLOL MALEATE 0.5 % OP SOLN
1.0000 [drp] | Freq: Two times a day (BID) | OPHTHALMIC | Status: DC
  Administered 2012-02-27 – 2012-03-02 (×7): 1 [drp] via OPHTHALMIC
  Filled 2012-02-27 (×5): qty 5

## 2012-02-27 MED ORDER — TAMSULOSIN HCL 0.4 MG PO CAPS
0.4000 mg | ORAL_CAPSULE | Freq: Every day | ORAL | Status: DC
  Administered 2012-02-27 – 2012-03-01 (×4): 0.4 mg via ORAL
  Filled 2012-02-27 (×4): qty 1

## 2012-02-27 MED ORDER — BISACODYL 5 MG PO TBEC
5.0000 mg | DELAYED_RELEASE_TABLET | Freq: Every day | ORAL | Status: DC | PRN
  Administered 2012-02-28: 5 mg via ORAL
  Filled 2012-02-27: qty 1

## 2012-02-27 MED ORDER — ACETAMINOPHEN 500 MG PO TABS
ORAL_TABLET | ORAL | Status: AC
  Administered 2012-02-27: 1000 mg
  Filled 2012-02-27: qty 2

## 2012-02-27 MED ORDER — ENOXAPARIN SODIUM 40 MG/0.4ML ~~LOC~~ SOLN: 40.0000 mg | SUBCUTANEOUS | Status: DC

## 2012-02-27 MED ORDER — DEXTROSE 5 % IV SOLN
1.0000 g | INTRAVENOUS | Status: DC
  Administered 2012-02-28 – 2012-02-29 (×2): 1 g via INTRAVENOUS
  Filled 2012-02-27 (×3): qty 10

## 2012-02-27 MED ORDER — METOPROLOL SUCCINATE ER 25 MG PO TB24
12.5000 mg | ORAL_TABLET | Freq: Every morning | ORAL | Status: DC
  Administered 2012-02-29 – 2012-03-02 (×3): 12.5 mg via ORAL
  Filled 2012-02-27 (×4): qty 1

## 2012-02-27 MED ORDER — DORZOLAMIDE HCL-TIMOLOL MAL PF 22.3-6.8 MG/ML OP SOLN: 1.0000 [drp] | Freq: Two times a day (BID) | OPHTHALMIC | Status: DC

## 2012-02-27 MED ORDER — POTASSIUM CHLORIDE IN NACL 20-0.9 MEQ/L-% IV SOLN
INTRAVENOUS | Status: AC
  Administered 2012-02-27: 1000 mL via INTRAVENOUS
  Administered 2012-02-28: 10 mL/h via INTRAVENOUS

## 2012-02-27 MED ORDER — PANTOPRAZOLE SODIUM 40 MG PO TBEC
40.0000 mg | DELAYED_RELEASE_TABLET | Freq: Every day | ORAL | Status: DC
  Administered 2012-02-28 – 2012-03-01 (×3): 40 mg via ORAL
  Filled 2012-02-27 (×3): qty 1

## 2012-02-27 MED ORDER — DEXTROSE 5 % IV SOLN
1.0000 g | Freq: Once | INTRAVENOUS | Status: AC
  Administered 2012-02-27: 1 g via INTRAVENOUS
  Filled 2012-02-27: qty 10

## 2012-02-27 MED ORDER — HYDROMORPHONE HCL PF 1 MG/ML IJ SOLN
0.5000 mg | INTRAMUSCULAR | Status: DC | PRN
  Administered 2012-02-29: 0.5 mg via INTRAVENOUS
  Filled 2012-02-27: qty 1

## 2012-02-27 MED ORDER — TRAZODONE HCL 50 MG PO TABS
25.0000 mg | ORAL_TABLET | Freq: Every evening | ORAL | Status: DC | PRN
  Administered 2012-02-28: 25 mg via ORAL
  Filled 2012-02-27: qty 1

## 2012-02-27 MED ORDER — SODIUM CHLORIDE 0.9 % IV BOLUS (SEPSIS)
500.0000 mL | Freq: Once | INTRAVENOUS | Status: AC
  Administered 2012-02-27: 17:00:00 via INTRAVENOUS

## 2012-02-27 MED ORDER — ONDANSETRON HCL 4 MG/2ML IJ SOLN
4.0000 mg | INTRAMUSCULAR | Status: DC | PRN
  Administered 2012-02-28 – 2012-02-29 (×2): 4 mg via INTRAVENOUS
  Filled 2012-02-27 (×2): qty 2

## 2012-02-27 MED ORDER — FLEET ENEMA 7-19 GM/118ML RE ENEM: 1.0000 | ENEMA | Freq: Once | RECTAL | Status: AC | PRN

## 2012-02-27 MED ORDER — BIOTENE DRY MOUTH MT LIQD
15.0000 mL | Freq: Two times a day (BID) | OROMUCOSAL | Status: DC
  Administered 2012-02-27 – 2012-03-02 (×8): 15 mL via OROMUCOSAL

## 2012-02-27 NOTE — H&P (Signed)
[facility] History and Physical  [name] [name].  ZOX:[id number]  DOB: 1941/05/04   DOA: 02/27/2012   PCP:   Cassell Smiles., MD   Chief Complaint:  Burning with urination since last night.  HPI: [name] [name]. is an 71 y.o. male.  Obese African American gentleman with a history of BPH, past history of kidney stones, and bladder infection, presents with intense burning and pain at the end of micturition burning at the end of since last night. In the emergency room his temperature was found to be 103.  In addition patient complains of a left flank pain, as well as chronic anterior abdominal pain.  He has episodic lower extremity edema, and now complains of breaking out of the skin of his right leg.   Rewiew of Systems:   All systems negative except as marked bold or noted in the HPI;  Constitutional: Negative for malaise, fever and chills. ;  Eyes: Negative for eye pain, redness and discharge. ;  ENMT: Negative for ear pain, hoarseness, nasal congestion, sinus pressure and sore throat. ;  Cardiovascular: Negative for chest pain, palpitations, diaphoresis, dyspnea and peripheral edema. ;  Respiratory: Negative for hemoptysis, wheezing and stridor. ; coughs up green stuff every day,  Gastrointestinal: Negative for nausea, vomiting, diarrhea, constipation, melena, blood in stool, hematemesis, jaundice and rectal bleeding. unusual weight loss. Chronic abdominal pain,.  Genitourinary: Negative for frequency, dysuria, incontinence,flank pain and hematuria;  Musculoskeletal: Negative for back pain and neck pain. Negative for swelling and trauma.;  Skin: . Negative for pruritus, rash, abrasions, bruising and skin lesion.; ulcerations worse  Neuro: Negative for headache, lightheadedness and neck stiffness. Negative for weakness, altered level of consciousness , altered mental status, extremity weakness, burning feet, involuntary movement, seizure and syncope.  Psych:  negative for anxiety, depression, insomnia, tearfulness, panic attacks, hallucinations, paranoia, suicidal or homicidal    Past Medical History  Diagnosis Date  . CHF (congestive heart failure)   . Hypertension   . Obesity   . Hyperlipidemia   . PUD (peptic ulcer disease)   . GERD (gastroesophageal reflux disease)   . S/P endoscopy 2007, 2012    2007: nl, May 2012: antral erosions  . Osteoarthritis   . S/P colonoscopy 2007, Feb and March 2012    hx of adenomas, left-sided diverticula, On 07/2010 TCS, fresh blood and clot noted coming from TI.  Marland Kitchen Anxiety   . Thyroid disease     hypothyroid  . Ringing in ears   . History of kidney stones   . History of bladder infections   . Heel spur     right heel  . Collagen vascular disease   . Thrombocytopenia 12/30/2011    Stable  . BPH (benign prostatic hyperplasia)     Past Surgical History  Procedure Date  . Knee arthroscopy   . Rotator cuff repair   . Cholecystectomy   . Lumbar spine surgery   . Small bowel capsule study 07/2011    ?transient focal ischemia in distal small bowel to explain GI bleeding  . Esophagogastroduodenoscopy Aug 2013    [facility], single 5mm pedunculated polyp otherwise normal    Medications:  HOME MEDS: Prior to Admission medications   Medication Sig Start Date End Date Taking? Authorizing Provider  acetaminophen (TYLENOL) 325 MG tablet Take 650 mg by mouth every 6 (six) hours as needed. Pain   Yes Historical Provider, MD  Dorzolamide HCl-Timolol Mal PF 22.3-6.8 MG/ML SOLN Apply 1 drop to eye 2 (  two) times daily. Dorzolamide HCI-Timolol Maleate Ophthalmic Solution 2.23%/0.68%   Yes Historical Provider, MD  furosemide (LASIX) 80 MG tablet Take 80 mg by mouth 2 (two) times daily.    Yes Historical Provider, MD  HYDROcodone-acetaminophen (NORCO) 10-325 MG per tablet Take 0.5-1 tablets by mouth every 6 (six) hours as needed. For Pain. *Do not exceed more than 4 tablets per day* 10/31/11  Yes Historical Provider, MD    latanoprost (XALATAN) 0.005 % ophthalmic solution Place 1 drop into both eyes at bedtime.   Yes Historical Provider, MD  metoprolol succinate (TOPROL-XL) 25 MG 24 hr tablet Take 12.5 mg by mouth every morning.    Yes Historical Provider, MD  omeprazole (PRILOSEC) 20 MG capsule Take 1 capsule (20 mg total) by mouth daily. 30 minutes before first meal 05/04/11 05/03/12 Yes Nira Retort, NP  ondansetron (ZOFRAN) 4 MG tablet Take 4 mg by mouth every 8 (eight) hours as needed. For nausea   Yes Historical Provider, MD  ondansetron (ZOFRAN) 8 MG tablet Take 8 mg by mouth every 8 (eight) hours as needed. For nausea 12/27/11  Yes Historical Provider, MD  OXYGEN-HELIUM IN Inhale 2 mLs into the lungs 2 (two) times daily.   Yes Historical Provider, MD  Probiotic Product (RESTORA) CAPS Take 1 capsule by mouth every morning.    Yes Historical Provider, MD  senna-docusate (SENOKOT-S) 8.6-50 MG per tablet Take 1 tablet by mouth daily.   Yes Historical Provider, MD  Tamsulosin HCl (FLOMAX) 0.4 MG CAPS Take 0.4 mg by mouth at bedtime.    Yes Historical Provider, MD  trolamine salicylate (ASPERCREME) 10 % cream Apply 1 application topically as needed. *Applied to knee(s)*   Yes Historical Provider, MD     Allergies:  Allergies  Allergen Reactions  . Aspirin Other (See Comments)    G.I. Bleed     Social History:   reports that he quit smoking about 38 years ago. His smoking use included Cigars. He has never used smokeless tobacco. He reports that he does not drink alcohol or use illicit drugs.  Family History: Family History  Problem Relation Age of Onset  . Colon cancer Father 92    deceased  . Prostate cancer Father   . Pancreatic cancer Mother 3    deceased     Physical Exam: Filed Vitals:   02/27/12 1828 02/27/12 1925 02/27/12 2000 02/27/12 2059  BP: 108/62 110/69 117/70   Pulse: 95 91 90   Temp:  99.7 F (37.6 C)    TempSrc:  Oral    Resp: 18 20 32   Height:    5\' 9"  (1.753 m)  Weight:     129.1 kg (284 lb 9.8 oz)  SpO2: 94% 93% 94%    Blood pressure 117/70, pulse 90, temperature 99.7 F (37.6 C), temperature source Oral, resp. rate 32, height 5\' 9"  (1.753 m), weight 129.1 kg (284 lb 9.8 oz), SpO2 94.00%.  GEN:  Pleasant obese elderly Caucasian gentleman lying in the stretcher in no acute distress; cooperative with exam PSYCH:  alert and oriented x4; appears somewhat anxious; affect is otherwise appropriate. HEENT: Mucous membranes pink, dry and anicteric; PERRLA; EOM intact; no cervical lymphadenopathy nor thyromegaly or carotid bruit; no JVD; Breasts:: Not examined CHEST WALL: No tenderness CHEST: Normal respiration, clear to auscultation bilaterally HEART: Regular rate and rhythm; no murmurs rubs or gallops BACK: No kyphosis or scoliosis; no CVA tenderness ABDOMEN: Obese, mild left upper quadrant tenderness; left posterior flank tendernessr; no masses,  no organomegaly, normal abdominal bowel sounds; ; no intertriginous candida. Rectal Exam: Mild tenderness of prostate; not sufficiently tender to suggest acute prostatitis EXTREMITIES: ; age-appropriate arthropathy of the hands and knees; 2+ edema of bilateral legs to mid shin; venous stasis changes more prominent on the right leg; no frank ulcerations drainage of the skin on the right leg is impending Genitalia: not examined PULSES: 2+ and symmetric SKIN: Normal hydration no rash or ulceration CNS: Cranial nerves 2-12 grossly intact no focal lateralizing neurologic deficit   Labs on Admission:  Basic Metabolic Panel:  Lab 02/27/12 4540  NA 137  K 3.9  CL 101  CO2 31  GLUCOSE 92  BUN 12  CREATININE 1.07  CALCIUM 9.5  MG --  PHOS --   Liver Function Tests:  Lab 02/27/12 1655  AST 14  ALT 10  ALKPHOS 67  BILITOT 1.1  PROT 6.4  ALBUMIN 3.0*    Lab 02/27/12 1655  LIPASE 19  AMYLASE --   No results found for this basename: AMMONIA:5 in the last 168 hours CBC:  Lab 02/27/12 1655  WBC 22.3*   NEUTROABS 19.7*  HGB 14.9  HCT 45.0  MCV 80.5  PLT 100*   Cardiac Enzymes: No results found for this basename: CKTOTAL:5,CKMB:5,CKMBINDEX:5,TROPONINI:5 in the last 168 hours BNP: No components found with this basename: POCBNP:5 D-dimer: No components found with this basename: D-DIMER:5 CBG: No results found for this basename: GLUCAP:5 in the last 168 hours  Radiological Exams on Admission: Ct Abdomen Pelvis Wo Contrast  02/27/2012  *RADIOLOGY REPORT*  Clinical Data: Left chest pain  CT ABDOMEN AND PELVIS WITHOUT CONTRAST  Technique:  Multidetector CT imaging of the abdomen and pelvis was performed following the standard protocol without intravenous contrast.  Comparison: 07/24/2010  Findings: Stable innumerable pelvic and cortical cysts in the kidneys.  Stable bilateral perinephric stranding.  No renal calculus.  No ureteral calculus.  Ureters are normal in caliber.  Normal appendix.  Post cholecystectomy  Unenhanced liver, spleen, pancreas, adrenal glands are within normal limits.  Bladder is decompressed.  Unremarkable prostate gland.  Spinal stenosis is multifactorial and L4-5.  Prominent facet arthropathy.  IMPRESSION: No evidence of urinary obstruction or urinary calculus.  Stable study.   Original Report Authenticated By: Donavan Burnet, M.D.    Dg Chest 2 View  02/27/2012  *RADIOLOGY REPORT*  Clinical Data: Cough, shortness of breath, fever  CHEST - 2 VIEW  Comparison: 12/26/2011; 07/23/2010; 07/12/2010; chest CT - 12/26/2011  Findings:  Grossly unchanged cardiac silhouette and enlarged mediastinal contours given persistently reduced lung volumes.  There is chronic mild elevation of the right hemidiaphragm.  Perihilar heterogeneous opacities are grossly unchanged.  Improved aeration of the right lower lung.  No new focal airspace opacities.  No definite pleural effusion or pneumothorax.  Surgical clips overlie the right upper abdomen.  Post left sided rotator cuff repair.  IMPRESSION:  Grossly unchanged decreased lung volumes with perihilar atelectasis/scar without definite acute cardiopulmonary disease.   Original Report Authenticated By: Waynard Reeds, M.D.     EKG: Independently reviewed. Sinus tachycardia, rate 108, in no acute ST segment   Assessment/Plan Present on Admission:  .SIRS (systemic inflammatory response syndrome) .UTI (lower urinary tract infection), possibly pyelonephritis  .HYPERTENSION .GASTROESOPHAGEAL REFLUX DISEASE, CHRONIC .ABDOMINAL PAIN .Thrombocytopenia .Hypothyroidism .Anxiety .BPH (benign prostatic hyperplasia) .Obesity .Hyperlipidemia   PLAN: We'll admit this gentleman for initiation of IV fluids and antibiotic therapy; pending results of blood and urine cultures And because of his  lower extremity edema limit IV hydration to 12 hours; review of echo done in 2010 does not show any evidence of systolic nor diastolic dysfunction. Continue management of chronic medical conditions  Other plans as per orders.  Code Status: FULL CODE  Family Communication: Wife Golden Hurter) and daughter(Vernalisa Knoble) present at the interview Disposition Plan: Consider urology consult as in or outpatient for further evaluation of this urinary tract    Shalona Harbour Nocturnist [facility] Pager [phone]  02/27/2012, 9:03 PM

## 2012-02-27 NOTE — ED Notes (Signed)
Lt chest pain intermittently since last night, Headache, dysuria, voiding small amts and "feels like I'm going to pass out"

## 2012-02-27 NOTE — ED Notes (Signed)
Dr. Orvan Falconer in to assess pt for admission.

## 2012-02-27 NOTE — ED Provider Notes (Signed)
History   Scribed for [employer]. [name] Mayers, MD, the patient was seen in room APA06/APA06 . This chart was scribed by [name] [name].    CSN: [id number]  Arrival date & time 02/27/12  1625   First MD Initiated Contact with Patient 02/27/12 1648      Chief Complaint  Patient presents with  . Dysuria    (Consider location/radiation/quality/duration/timing/severity/associated sxs/prior treatment) HPI [name] [name]. is a 70 y.o. male who presents to the Emergency Department complaining of intermittent moderate to severe dysuria since last night. Pt states he feels like "he is going to pass out" when he attempts to void. Pt describes the pain as sharp and primarily located in his groin and lower back without radiation. Pt reports associated fever of 103 on arrival. Pt reports hx of bladder infections and kidney stones. Pt additionally reports mild chest pain, chronic shortness of breath, and coughing up mild phlegm. Pt states he uses oxygen at home as needed and at night. Pt denies smoking, but spouse smokes at home.Was having some constipation yesterday and used a laxative and stool softener with some results.   Past Medical History  Diagnosis Date  . CHF (congestive heart failure)   . Hypertension   . Obesity   . Hyperlipidemia   . PUD (peptic ulcer disease)   . GERD (gastroesophageal reflux disease)   . S/P endoscopy 2007, 2012    2007: nl, May 2012: antral erosions  . Osteoarthritis   . S/P colonoscopy 2007, Feb and March 2012    hx of adenomas, left-sided diverticula, On 07/2010 TCS, fresh blood and clot noted coming from TI.  Marland Kitchen Anxiety   . Thyroid disease     hypothyroid  . Ringing in ears   . History of kidney stones   . History of bladder infections   . Heel spur     right heel  . Collagen vascular disease   . Thrombocytopenia 12/30/2011    Stable  . Prostate cancer     BPH    Past Surgical History  Procedure Date  . Knee arthroscopy   . Rotator cuff repair     . Cholecystectomy   . Lumbar spine surgery   . Small bowel capsule study 07/2011    ?transient focal ischemia in distal small bowel to explain GI bleeding  . Esophagogastroduodenoscopy Aug 2013    [facility], single 5mm pedunculated polyp otherwise normal    Family History  Problem Relation Age of Onset  . Colon cancer Father 30    deceased  . Prostate cancer Father   . Pancreatic cancer Mother 60    deceased    History  Substance Use Topics  . Smoking status: Former Smoker    Types: Cigars    Quit date: 08/10/1973  . Smokeless tobacco: Never Used  . Alcohol Use: No      Review of Systems A complete 10 system review of systems was obtained and all systems are negative except as noted in the HPI and PMH.   Allergies  Aspirin  Home Medications   Current Outpatient Rx  Name Route Sig Dispense Refill  . ACETAMINOPHEN 325 MG PO TABS Oral Take 650 mg by mouth every 6 (six) hours as needed. Pain    . DORZOLAMIDE HCL-TIMOLOL MAL PF 22.3-6.8 MG/ML OP SOLN Ophthalmic Apply 1 drop to eye 2 (two) times daily. Dorzolamide HCI-Timolol Maleate Ophthalmic Solution 2.23%/0.68%    . FUROSEMIDE 80 MG PO TABS Oral Take 80 mg by mouth  daily.     Marland Kitchen HYDROCODONE-ACETAMINOPHEN 10-325 MG PO TABS Oral Take 1 tablet by mouth every 6 (six) hours as needed. For Pain. *Do not exceed more than 4 tablets per day*    . LATANOPROST 0.005 % OP SOLN Both Eyes Place 1 drop into both eyes at bedtime.    Marland Kitchen METOPROLOL SUCCINATE ER 25 MG PO TB24 Oral Take 12.5 mg by mouth every morning.     Marland Kitchen OMEPRAZOLE 20 MG PO CPDR Oral Take 1 capsule (20 mg total) by mouth daily. 30 minutes before first meal 30 capsule 5  . OXYGEN-HELIUM IN Inhalation Inhale 2 mLs into the lungs 2 (two) times daily.    . RESTORA PO CAPS Oral Take 1 capsule by mouth every morning.    Marland Kitchen SIMVASTATIN 40 MG PO TABS Oral Take 40 mg by mouth every evening.    Marland Kitchen TAMSULOSIN HCL 0.4 MG PO CAPS Oral Take 0.4 mg by mouth daily after supper.      . TROLAMINE  SALICYLATE 10 % EX CREA Topical Apply 1 application topically as needed. *Applied to knee(s)*      BP 134/75  Pulse 104  Temp 103 F (39.4 C) (Oral)  Ht 5\' 9"  (1.753 m)  Wt 210 lb (95.255 kg)  BMI 31.01 kg/m2  SpO2 90%  Physical Exam  Nursing note and vitals reviewed. Constitutional: He is oriented to person, place, and time. He appears well-developed and well-nourished.  HENT:  Head: Normocephalic and atraumatic.  Eyes: EOM are normal. Pupils are equal, round, and reactive to light.  Neck: Normal range of motion. Neck supple.  Cardiovascular: Normal rate, normal heart sounds and intact distal pulses.   Pulmonary/Chest: Effort normal and breath sounds normal. No respiratory distress. He has no wheezes. He has no rales.       Diffuse decreased air movement  Abdominal: Soft. Bowel sounds are normal. He exhibits no distension. There is tenderness (abdomen diffusely tender on exam). There is no rebound and no guarding.  Musculoskeletal: Normal range of motion. He exhibits edema (chronic edema). He exhibits no tenderness.  Neurological: He is alert and oriented to person, place, and time. He has normal strength. No cranial nerve deficit or sensory deficit.  Skin: Skin is warm and dry. No rash noted.  Psychiatric: He has a normal mood and affect.    ED Course  Procedures (including critical care time)  Labs Reviewed  CBC WITH DIFFERENTIAL - Abnormal; Notable for the following:    WBC 22.3 (*)     RDW 16.6 (*)     Platelets 100 (*)     Neutrophils Relative 88 (*)     Lymphocytes Relative 6 (*)     Neutro Abs 19.7 (*)     Monocytes Absolute 1.3 (*)     All other components within normal limits  COMPREHENSIVE METABOLIC PANEL - Abnormal; Notable for the following:    Albumin 3.0 (*)     GFR calc non Af Amer 67 (*)     GFR calc Af Amer 78 (*)     All other components within normal limits  LIPASE, BLOOD  URINALYSIS, ROUTINE W REFLEX MICROSCOPIC  LACTIC ACID, PLASMA  URINE  CULTURE   Dg Chest 2 View  02/27/2012  *RADIOLOGY REPORT*  Clinical Data: Cough, shortness of breath, fever  CHEST - 2 VIEW  Comparison: 12/26/2011; 07/23/2010; 07/12/2010; chest CT - 12/26/2011  Findings:  Grossly unchanged cardiac silhouette and enlarged mediastinal contours given persistently reduced lung volumes.  There is chronic mild elevation of the right hemidiaphragm.  Perihilar heterogeneous opacities are grossly unchanged.  Improved aeration of the right lower lung.  No new focal airspace opacities.  No definite pleural effusion or pneumothorax.  Surgical clips overlie the right upper abdomen.  Post left sided rotator cuff repair.  IMPRESSION: Grossly unchanged decreased lung volumes with perihilar atelectasis/scar without definite acute cardiopulmonary disease.   Original Report Authenticated By: Waynard Reeds, M.D.      No diagnosis found.    MDM   Date: 02/27/2012  Rate: 108  Rhythm: sinus tachycardia  QRS Axis: normal  Intervals: normal  ST/T Wave abnormalities: normal  Conduction Disutrbances:none  Narrative Interpretation:   Old EKG Reviewed: unchanged    6:50 PM Labs show leukocytosis and UTI. Rocephin ordered. Will discuss admission with PCP.     I personally performed the services described in the documentation, which were scribed in my presence. The recorded information has been reviewed and considered.    [employer] B. [name] Mayers, MD 02/27/12 1850

## 2012-02-28 DIAGNOSIS — N400 Enlarged prostate without lower urinary tract symptoms: Secondary | ICD-10-CM | POA: Diagnosis not present

## 2012-02-28 DIAGNOSIS — R198 Other specified symptoms and signs involving the digestive system and abdomen: Secondary | ICD-10-CM | POA: Diagnosis not present

## 2012-02-28 DIAGNOSIS — N390 Urinary tract infection, site not specified: Secondary | ICD-10-CM | POA: Diagnosis not present

## 2012-02-28 LAB — COMPREHENSIVE METABOLIC PANEL
AST: 14 U/L (ref 0–37)
Albumin: 2.5 g/dL — ABNORMAL LOW (ref 3.5–5.2)
BUN: 10 mg/dL (ref 6–23)
Creatinine, Ser: 1 mg/dL (ref 0.50–1.35)
Total Protein: 5.6 g/dL — ABNORMAL LOW (ref 6.0–8.3)

## 2012-02-28 LAB — CBC
HCT: 41.9 % (ref 39.0–52.0)
MCHC: 32.9 g/dL (ref 30.0–36.0)
MCV: 80.6 fL (ref 78.0–100.0)
Platelets: 95 10*3/uL — ABNORMAL LOW (ref 150–400)
RDW: 16.8 % — ABNORMAL HIGH (ref 11.5–15.5)
WBC: 20.5 10*3/uL — ABNORMAL HIGH (ref 4.0–10.5)

## 2012-02-28 LAB — TSH: TSH: 0.986 u[IU]/mL (ref 0.350–4.500)

## 2012-02-28 MED ORDER — SENNOSIDES-DOCUSATE SODIUM 8.6-50 MG PO TABS
1.0000 | ORAL_TABLET | Freq: Two times a day (BID) | ORAL | Status: DC
  Administered 2012-02-28 – 2012-03-01 (×6): 1 via ORAL
  Filled 2012-02-28 (×6): qty 1

## 2012-02-28 MED ORDER — INFLUENZA VIRUS VACC SPLIT PF IM SUSP
0.5000 mL | Freq: Once | INTRAMUSCULAR | Status: AC
  Administered 2012-02-28: 0.5 mL via INTRAMUSCULAR
  Filled 2012-02-28: qty 0.5

## 2012-02-28 NOTE — Progress Notes (Signed)
Patient complains of burning while urinating and increase urgency and swollen scrotum. Dr. Karilyn Cota paged, awaiting return call.

## 2012-02-28 NOTE — Progress Notes (Signed)
INITIAL ADULT NUTRITION ASSESSMENT Date: 02/28/2012   Time: 1:53 PM Reason for Assessment: Malnutrition Screen  ASSESSMENT: Male 71 y.o.  Dx: SIRS (systemic inflammatory response syndrome)   Past Medical History  Diagnosis Date  . CHF (congestive heart failure)   . Hypertension   . Obesity   . Hyperlipidemia   . PUD (peptic ulcer disease)   . GERD (gastroesophageal reflux disease)   . S/P endoscopy 2007, 2012    2007: nl, May 2012: antral erosions  . Osteoarthritis   . S/P colonoscopy 2007, Feb and March 2012    hx of adenomas, left-sided diverticula, On 07/2010 TCS, fresh blood and clot noted coming from TI.  Marland Kitchen Anxiety   . Thyroid disease     hypothyroid  . Ringing in ears   . History of kidney stones   . History of bladder infections   . Heel spur     right heel  . Collagen vascular disease   . Thrombocytopenia 12/30/2011    Stable  . BPH (benign prostatic hyperplasia)    Related Meds:  Scheduled Meds:   . acetaminophen      . antiseptic oral rinse  15 mL Mouth Rinse BID  . cefTRIAXone (ROCEPHIN)  IV  1 g Intravenous Once  . cefTRIAXone (ROCEPHIN)  IV  1 g Intravenous Q24H  . dorzolamide  1 drop Both Eyes BID   And  . timolol  1 drop Both Eyes BID  . enoxaparin (LOVENOX) injection  60 mg Subcutaneous Q24H  . influenza  inactive virus vaccine  0.5 mL Intramuscular Once  . latanoprost  1 drop Both Eyes QHS  . metoprolol succinate  12.5 mg Oral q morning - 10a  . pantoprazole  40 mg Oral Q1200  . senna-docusate  1 tablet Oral BID  . sodium chloride  500 mL Intravenous Once  . Tamsulosin HCl  0.4 mg Oral QHS  . DISCONTD: Dorzolamide HCl-Timolol Mal PF  1 drop Ophthalmic BID  . DISCONTD: enoxaparin (LOVENOX) injection  40 mg Subcutaneous Q24H  . DISCONTD: senna-docusate  1 tablet Oral Daily   Continuous Infusions:   . 0.9 % NaCl with KCl 20 mEq / L 10 mL/hr (02/28/12 0946)   PRN Meds:.acetaminophen, bisacodyl, HYDROmorphone (DILAUDID) injection, Muscle Rub,  ondansetron (ZOFRAN) IV, oxyCODONE, sodium phosphate, traZODone, DISCONTD: trolamine salicylate  Ht: 5\' 9"  (175.3 cm)  Wt: 292 lb 8 oz (132.677 kg)  Ideal Wt:  70.7 kg % Ideal Wt: 188%  Usual Wt: 275-300 # per pt Wt Readings from Last 10 Encounters:  02/28/12 292 lb 8 oz (132.677 kg)  01/17/12 286 lb 12.8 oz (130.092 kg)  12/30/11 305 lb (138.347 kg)  12/27/11 300 lb 9.6 oz (136.351 kg)  12/23/11 298 lb 6.4 oz (135.353 kg)  10/27/11 300 lb 12.8 oz (136.442 kg)  05/30/11 302 lb (136.986 kg)  04/20/11 303 lb 3.2 oz (137.531 kg)  01/25/11 309 lb (140.161 kg)  11/15/10 302 lb (136.986 kg)    Body mass index is 43.19 kg/(m^2).Meets criteria for Obesity Class III  Food/Nutrition Related Hx: Pt very pleasant gentleman. He ate very well at breakfast and lunch today. Denies chewing or swallow difficulty. Feeds himself. He reports variable wt hx possibly r/t fluid changes (hx of CHF noted). He does not meet criteria for malnutrition at this time.   CMP     Component Value Date/Time   NA 138 02/28/2012 0531   K 4.0 02/28/2012 0531   CL 103 02/28/2012 0531   CO2  30 02/28/2012 0531   GLUCOSE 120* 02/28/2012 0531   BUN 10 02/28/2012 0531   CREATININE 1.00 02/28/2012 0531   CREATININE 0.88 10/27/2011 1141   CALCIUM 8.8 02/28/2012 0531   PROT 5.6* 02/28/2012 0531   ALBUMIN 2.5* 02/28/2012 0531   AST 14 02/28/2012 0531   ALT 12 02/28/2012 0531   ALKPHOS 78 02/28/2012 0531   BILITOT 1.3* 02/28/2012 0531   GFRNONAA 73* 02/28/2012 0531   GFRAA 85* 02/28/2012 0531    Intake/Output Summary (Last 24 hours) at 02/28/12 1401 Last data filed at 02/28/12 1300  Gross per 24 hour  Intake   1080 ml  Output    950 ml  Net    130 ml    Diet Order: Cardiac  Supplements/Tube Feeding:none at this time  IVF:    0.9 % NaCl with KCl 20 mEq / L Last Rate: 10 mL/hr (02/28/12 0946)    Estimated Nutritional Needs:   Kcal:2100-2400 kcal Protein:100-125 gr Fluid:1 ml/kcal  NUTRITION DIAGNOSIS: None at  this time   INTERVENTION: -Heart Healthy diet per MD  Dietitian [phone]  DOCUMENTATION CODES Per approved criteria  -Morbid Obesity    Francene Boyers 02/28/2012, 1:53 PM

## 2012-02-28 NOTE — Progress Notes (Signed)
Subjective: This man came in with symptoms and signs of UTI versus prostatitis. He does have a history of BPH and he sees Dr. Jerre Simon. This morning, he describes urinary incontinence. He did have a fever last night but does not seem to have one this morning. He is able to tolerate a diet without any vomiting. He has not opened his bowels, however.           Physical Exam: Blood pressure 96/68, pulse 94, temperature 100.1 F (37.8 C), temperature source Oral, resp. rate 19, height 5\' 9"  (1.753 m), weight 132.677 kg (292 lb 8 oz), SpO2 92.00%. He looks systemically well and is not toxic or septic. Heart sounds are present and normal. Lung fields are clear. Abdomen soft and nontender. He is alert and oriented.   Investigations:  No results found for this or any previous visit (from the past 240 hour(s)).   Basic Metabolic Panel:  Basename 02/28/12 0531 02/27/12 1655  NA 138 137  K 4.0 3.9  CL 103 101  CO2 30 31  GLUCOSE 120* 92  BUN 10 12  CREATININE 1.00 1.07  CALCIUM 8.8 9.5  MG -- --  PHOS -- --   Liver Function Tests:  [facility] 02/28/12 0531 02/27/12 1655  AST 14 14  ALT 12 10  ALKPHOS 78 67  BILITOT 1.3* 1.1  PROT 5.6* 6.4  ALBUMIN 2.5* 3.0*     CBC:  Basename 02/28/12 0531 02/27/12 1655  WBC 20.5* 22.3*  NEUTROABS -- 19.7*  HGB 13.8 14.9  HCT 41.9 45.0  MCV 80.6 80.5  PLT 95* 100*    Ct Abdomen Pelvis Wo Contrast  02/27/2012  *RADIOLOGY REPORT*  Clinical Data: Left chest pain  CT ABDOMEN AND PELVIS WITHOUT CONTRAST  Technique:  Multidetector CT imaging of the abdomen and pelvis was performed following the standard protocol without intravenous contrast.  Comparison: 07/24/2010  Findings: Stable innumerable pelvic and cortical cysts in the kidneys.  Stable bilateral perinephric stranding.  No renal calculus.  No ureteral calculus.  Ureters are normal in caliber.  Normal appendix.  Post cholecystectomy  Unenhanced liver, spleen, pancreas, adrenal  glands are within normal limits.  Bladder is decompressed.  Unremarkable prostate gland.  Spinal stenosis is multifactorial and L4-5.  Prominent facet arthropathy.  IMPRESSION: No evidence of urinary obstruction or urinary calculus.  Stable study.   Original Report Authenticated By: Donavan Burnet, M.D.    Dg Chest 2 View  02/27/2012  *RADIOLOGY REPORT*  Clinical Data: Cough, shortness of breath, fever  CHEST - 2 VIEW  Comparison: 12/26/2011; 07/23/2010; 07/12/2010; chest CT - 12/26/2011  Findings:  Grossly unchanged cardiac silhouette and enlarged mediastinal contours given persistently reduced lung volumes.  There is chronic mild elevation of the right hemidiaphragm.  Perihilar heterogeneous opacities are grossly unchanged.  Improved aeration of the right lower lung.  No new focal airspace opacities.  No definite pleural effusion or pneumothorax.  Surgical clips overlie the right upper abdomen.  Post left sided rotator cuff repair.  IMPRESSION: Grossly unchanged decreased lung volumes with perihilar atelectasis/scar without definite acute cardiopulmonary disease.   Original Report Authenticated By: Waynard Reeds, M.D.       Medications: I have reviewed the patient's current medications.  Impression: 1. Urinary tract infection versus prostatitis. Not clinically septic or toxic. 2. BPH on Flomax. 3. Hypertension. 4. History of leukocytosis and thrombocytopenia, being followed by oncology , Dr. Mariel Sleet. His current leukocytosis of around 22 is in part  due to his infection. 5. Obesity. 6. Constipation.    Plan: 1. Increase Senokot 2 twice a day. 2. Mobilize. 3. Reduce IV fluids and encourage oral intake. 4. Plan to discharge home tomorrow if medically stable and improving.     LOS: 1 day   Wilson Singer Pager [phone]  02/28/2012, 8:38 AM

## 2012-02-29 DIAGNOSIS — K5900 Constipation, unspecified: Secondary | ICD-10-CM

## 2012-02-29 DIAGNOSIS — R609 Edema, unspecified: Secondary | ICD-10-CM

## 2012-02-29 DIAGNOSIS — N400 Enlarged prostate without lower urinary tract symptoms: Secondary | ICD-10-CM | POA: Diagnosis not present

## 2012-02-29 DIAGNOSIS — N390 Urinary tract infection, site not specified: Secondary | ICD-10-CM | POA: Diagnosis not present

## 2012-02-29 MED ORDER — BISACODYL 5 MG PO TBEC
10.0000 mg | DELAYED_RELEASE_TABLET | Freq: Once | ORAL | Status: AC
  Administered 2012-02-29: 10 mg via ORAL
  Filled 2012-02-29: qty 2

## 2012-02-29 MED ORDER — POLYETHYLENE GLYCOL 3350 17 G PO PACK
17.0000 g | PACK | Freq: Two times a day (BID) | ORAL | Status: DC
  Administered 2012-02-29 – 2012-03-02 (×5): 17 g via ORAL
  Filled 2012-02-29 (×5): qty 1

## 2012-02-29 MED ORDER — PHENAZOPYRIDINE HCL 100 MG PO TABS
200.0000 mg | ORAL_TABLET | Freq: Three times a day (TID) | ORAL | Status: AC
  Administered 2012-02-29 – 2012-03-02 (×6): 200 mg via ORAL
  Filled 2012-02-29 (×5): qty 2
  Filled 2012-02-29 (×2): qty 1

## 2012-02-29 MED ORDER — FUROSEMIDE 80 MG PO TABS
80.0000 mg | ORAL_TABLET | Freq: Two times a day (BID) | ORAL | Status: DC
  Administered 2012-02-29 – 2012-03-02 (×5): 80 mg via ORAL
  Filled 2012-02-29 (×5): qty 1

## 2012-02-29 NOTE — Progress Notes (Signed)
UR Chart Review Completed  

## 2012-02-29 NOTE — Care Management Note (Signed)
    Page 1 of 1   03/02/2012     3:36:21 PM   CARE MANAGEMENT NOTE 03/02/2012  Patient:  LAZARIUS, RIVKIN   Account Number:  [id number]  Date Initiated:  02/29/2012  Documentation initiated by:  Rosemary Holms  Subjective/Objective Assessment:   Pt admitted with UTI/SIRS. Lives at home with his wife. No HH needs identified or anticipated.     Action/Plan:   Anticipated DC Date:  03/02/2012   Anticipated DC Plan:  HOME/SELF CARE      DC Planning Services  CM consult      Choice offered to / List presented to:             Status of service:  Completed, signed off Medicare Important Message given?  YES (If response is "NO", the following Medicare IM given date fields will be blank) Date Medicare IM given:  02/29/2012 Date Additional Medicare IM given:    Discharge Disposition:  HOME/SELF CARE  Per UR Regulation:    If discussed at Long Length of Stay Meetings, dates discussed:    Comments:  02/29/12 1130 Leary Mcnulty Leanord Hawking RN BSN CM

## 2012-02-29 NOTE — Progress Notes (Signed)
Chart reviewed.  Subjective: Multiple complaints. Dysuria. Constipation. Leg swelling. Scrotal swelling. Chills last night. Insomnia. Does admit however that he is tolerating a regular diet but still feels nauseated periodically. Urinary hesitancy and incontinence. Nurses report he is eating 75-100% of his meals.  Objective: Vital signs in last 24 hours: Filed Vitals:   02/29/12 0032 02/29/12 0219 02/29/12 0529 02/29/12 0721  BP:  117/64 130/72   Pulse:  94 105   Temp: 98.7 F (37.1 C) 99.1 F (37.3 C) 99.3 F (37.4 C)   TempSrc: Oral Oral Oral   Resp:  22 22   Height:      Weight:   131 kg (288 lb 12.8 oz)   SpO2:  96% 97% 96%   Weight change: 35.745 kg (78 lb 12.8 oz)  Intake/Output Summary (Last 24 hours) at 02/29/12 1150 Last data filed at 02/29/12 0530  Gross per 24 hour  Intake    580 ml  Output    900 ml  Net   -320 ml   General: Appears comfortable watching TV. Up in chair. Lungs clear to auscultation bilaterally without wheeze rhonchi or rales Cardiovascular regular rate rhythm without murmurs gallops rubs Abdomen obese soft nontender nondistended Back no CVA tenderness GU mild scrotal edema. Extremities 2+ pitting edema mainly in the distal lower extremities.  Lab Results: Basic Metabolic Panel:  Lab 02/28/12 1610 02/27/12 1655  NA 138 137  K 4.0 3.9  CL 103 101  CO2 30 31  GLUCOSE 120* 92  BUN 10 12  CREATININE 1.00 1.07  CALCIUM 8.8 9.5  MG -- --  PHOS -- --   Liver Function Tests:  Lab 02/28/12 0531 02/27/12 1655  AST 14 14  ALT 12 10  ALKPHOS 78 67  BILITOT 1.3* 1.1  PROT 5.6* 6.4  ALBUMIN 2.5* 3.0*    Lab 02/27/12 1655  LIPASE 19  AMYLASE --   No results found for this basename: AMMONIA:2 in the last 168 hours CBC:  Lab 02/28/12 0531 02/27/12 1655  WBC 20.5* 22.3*  NEUTROABS -- 19.7*  HGB 13.8 14.9  HCT 41.9 45.0  MCV 80.6 80.5  PLT 95* 100*   Cardiac Enzymes: No results found for this basename:  CKTOTAL:3,CKMB:3,CKMBINDEX:3,TROPONINI:3 in the last 168 hours BNP: No results found for this basename: PROBNP:3 in the last 168 hours D-Dimer: No results found for this basename: DDIMER:2 in the last 168 hours CBG: No results found for this basename: GLUCAP:6 in the last 168 hours Hemoglobin A1C: No results found for this basename: HGBA1C in the last 168 hours Fasting Lipid Panel: No results found for this basename: CHOL,HDL,LDLCALC,TRIG,CHOLHDL,LDLDIRECT in the last 960 hours Thyroid Function Tests:  Lab 02/27/12 2058  TSH 0.986  T4TOTAL --  FREET4 --  T3FREE --  THYROIDAB --   Coagulation: No results found for this basename: LABPROT:4,INR:4 in the last 168 hours Anemia Panel: No results found for this basename: VITAMINB12,FOLATE,FERRITIN,TIBC,IRON,RETICCTPCT in the last 168 hours Urine Drug Screen: Drugs of Abuse  No results found for this basename: labopia, cocainscrnur, labbenz, amphetmu, thcu, labbarb    Alcohol Level: No results found for this basename: ETH:2 in the last 168 hours Urinalysis:  Lab 02/27/12 1715  COLORURINE YELLOW  LABSPEC 1.010  PHURINE 6.0  GLUCOSEU NEGATIVE  HGBUR MODERATE*  BILIRUBINUR NEGATIVE  KETONESUR NEGATIVE  PROTEINUR NEGATIVE  UROBILINOGEN 0.2  NITRITE NEGATIVE  LEUKOCYTESUR SMALL*   Micro Results: Recent Results (from the past 240 hour(s))  URINE CULTURE     Status:  Normal (Preliminary result)   Collection Time   02/27/12  5:15 PM      Component Value Range Status Comment   Specimen Description URINE, CLEAN CATCH   Final    Special Requests NONE   Final    Culture  Setup Time 02/28/2012 03:22   Final    Colony Count >=100,000 COLONIES/ML   Final    Culture ESCHERICHIA COLI   Final    Report Status PENDING   Incomplete    Studies/Results: Ct Abdomen Pelvis Wo Contrast  02/27/2012  *RADIOLOGY REPORT*  Clinical Data: Left chest pain  CT ABDOMEN AND PELVIS WITHOUT CONTRAST  Technique:  Multidetector CT imaging of the abdomen  and pelvis was performed following the standard protocol without intravenous contrast.  Comparison: 07/24/2010  Findings: Stable innumerable pelvic and cortical cysts in the kidneys.  Stable bilateral perinephric stranding.  No renal calculus.  No ureteral calculus.  Ureters are normal in caliber.  Normal appendix.  Post cholecystectomy  Unenhanced liver, spleen, pancreas, adrenal glands are within normal limits.  Bladder is decompressed.  Unremarkable prostate gland.  Spinal stenosis is multifactorial and L4-5.  Prominent facet arthropathy.  IMPRESSION: No evidence of urinary obstruction or urinary calculus.  Stable study.   Original Report Authenticated By: Donavan Burnet, M.D.    Dg Chest 2 View  02/27/2012  *RADIOLOGY REPORT*  Clinical Data: Cough, shortness of breath, fever  CHEST - 2 VIEW  Comparison: 12/26/2011; 07/23/2010; 07/12/2010; chest CT - 12/26/2011  Findings:  Grossly unchanged cardiac silhouette and enlarged mediastinal contours given persistently reduced lung volumes.  There is chronic mild elevation of the right hemidiaphragm.  Perihilar heterogeneous opacities are grossly unchanged.  Improved aeration of the right lower lung.  No new focal airspace opacities.  No definite pleural effusion or pneumothorax.  Surgical clips overlie the right upper abdomen.  Post left sided rotator cuff repair.  IMPRESSION: Grossly unchanged decreased lung volumes with perihilar atelectasis/scar without definite acute cardiopulmonary disease.   Original Report Authenticated By: Waynard Reeds, M.D.    Scheduled Meds:   . antiseptic oral rinse  15 mL Mouth Rinse BID  . bisacodyl  10 mg Oral Once  . cefTRIAXone (ROCEPHIN)  IV  1 g Intravenous Q24H  . dorzolamide  1 drop Both Eyes BID   And  . timolol  1 drop Both Eyes BID  . enoxaparin (LOVENOX) injection  60 mg Subcutaneous Q24H  . influenza  inactive virus vaccine  0.5 mL Intramuscular Once  . latanoprost  1 drop Both Eyes QHS  . metoprolol succinate   12.5 mg Oral q morning - 10a  . pantoprazole  40 mg Oral Q1200  . polyethylene glycol  17 g Oral BID  . senna-docusate  1 tablet Oral BID  . Tamsulosin HCl  0.4 mg Oral QHS   Continuous Infusions:  PRN Meds:.acetaminophen, HYDROmorphone (DILAUDID) injection, Muscle Rub, ondansetron (ZOFRAN) IV, oxyCODONE, traZODone, DISCONTD: bisacodyl Assessment/Plan: Principal Problem:  *UTI (lower urinary tract infection), Escherichia coli. Sensitivities pending. Continue Rocephin for now. Add Pyridium for dysuria. Check post void residual. Active Problems:  Thrombocytopenia  BPH (benign prostatic hyperplasia)  Constipation: Add MiraLAX and Dulcolax suppositories.  Edema: Resume Lasix. He takes 80 mg twice daily as needed for swelling.  HYPERTENSION, controlled.  GASTROESOPHAGEAL REFLUX DISEASE, CHRONIC  Hypothyroidism  Obesity  Hyperlipidemia  SIRS (systemic inflammatory response syndrome) resolving.   LOS: 2 days   Sephora Boyar L 02/29/2012, 11:50 AM

## 2012-02-29 NOTE — Plan of Care (Signed)
Problem: Phase II Progression Outcomes Goal: Progress activity as tolerated unless otherwise ordered Outcome: Completed/Met Date Met:  02/29/12 Patient ambulated in hallway today without difficulty.

## 2012-03-01 DIAGNOSIS — D696 Thrombocytopenia, unspecified: Secondary | ICD-10-CM

## 2012-03-01 DIAGNOSIS — R609 Edema, unspecified: Secondary | ICD-10-CM | POA: Diagnosis not present

## 2012-03-01 DIAGNOSIS — N390 Urinary tract infection, site not specified: Secondary | ICD-10-CM | POA: Diagnosis not present

## 2012-03-01 DIAGNOSIS — K5900 Constipation, unspecified: Secondary | ICD-10-CM | POA: Diagnosis not present

## 2012-03-01 LAB — URINE CULTURE

## 2012-03-01 LAB — CBC
Platelets: 82 10*3/uL — ABNORMAL LOW (ref 150–400)
RBC: 5.04 MIL/uL (ref 4.22–5.81)
WBC: 7.3 10*3/uL (ref 4.0–10.5)

## 2012-03-01 MED ORDER — FLEET ENEMA 7-19 GM/118ML RE ENEM
1.0000 | ENEMA | Freq: Once | RECTAL | Status: AC
  Administered 2012-03-01: 1 via RECTAL

## 2012-03-01 MED ORDER — POTASSIUM CHLORIDE CRYS ER 20 MEQ PO TBCR
40.0000 meq | EXTENDED_RELEASE_TABLET | Freq: Two times a day (BID) | ORAL | Status: DC
  Administered 2012-03-01 (×2): 40 meq via ORAL
  Filled 2012-03-01: qty 1
  Filled 2012-03-01: qty 2
  Filled 2012-03-01: qty 1

## 2012-03-01 MED ORDER — CEPHALEXIN 500 MG PO CAPS
500.0000 mg | ORAL_CAPSULE | Freq: Four times a day (QID) | ORAL | Status: DC
  Administered 2012-03-01 – 2012-03-02 (×4): 500 mg via ORAL
  Filled 2012-03-01 (×4): qty 1

## 2012-03-01 MED ORDER — ONDANSETRON HCL 4 MG PO TABS
4.0000 mg | ORAL_TABLET | Freq: Three times a day (TID) | ORAL | Status: DC | PRN
  Administered 2012-03-01 – 2012-03-02 (×2): 4 mg via ORAL
  Filled 2012-03-01 (×2): qty 1

## 2012-03-01 NOTE — Progress Notes (Signed)
Per RN, lost PIV access.  Subjective: Feels a little better today.  Less dysuria. No vomiting. Hand is swollen from where her IV was. Leg swelling and scrotal swelling about at baseline per family. Still has not had a bowel movement, despite laxatives, suppositories.  Objective: Vital signs in last 24 hours: Filed Vitals:   03/01/12 0030 03/01/12 0202 03/01/12 0620 03/01/12 0900  BP:   108/66   Pulse:   73   Temp: 101.3 F (38.5 C) 99.8 F (37.7 C) 99.4 F (37.4 C) 98.3 F (36.8 C)  TempSrc: Oral Oral Oral Oral  Resp:   18   Height:      Weight:      SpO2:   92%    Weight change:   Intake/Output Summary (Last 24 hours) at 03/01/12 1044 Last data filed at 03/01/12 0500  Gross per 24 hour  Intake    340 ml  Output   1125 ml  Net   -785 ml   General: Appears comfortable watching TV. Up in chair. Lungs clear to auscultation bilaterally without wheeze rhonchi or rales Cardiovascular regular rate rhythm without murmurs gallops rubs Abdomen obese soft nontender nondistended Back no CVA tenderness GU minimal scrotal edema. Extremities 2+ pitting edema mainly in the distal lower extremities. Right hand edematous. No erythema warmth or cords.  Lab Results: Basic Metabolic Panel:  Lab 02/28/12 1610 02/27/12 1655  NA 138 137  K 4.0 3.9  CL 103 101  CO2 30 31  GLUCOSE 120* 92  BUN 10 12  CREATININE 1.00 1.07  CALCIUM 8.8 9.5  MG -- --  PHOS -- --   Liver Function Tests:  Lab 02/28/12 0531 02/27/12 1655  AST 14 14  ALT 12 10  ALKPHOS 78 67  BILITOT 1.3* 1.1  PROT 5.6* 6.4  ALBUMIN 2.5* 3.0*    Lab 02/27/12 1655  LIPASE 19  AMYLASE --   No results found for this basename: AMMONIA:2 in the last 168 hours CBC:  Lab 03/01/12 0449 02/28/12 0531 02/27/12 1655  WBC 7.3 20.5* --  NEUTROABS -- -- 19.7*  HGB 13.5 13.8 --  HCT 39.6 41.9 --  MCV 78.6 80.6 --  PLT 82* 95* --   Cardiac Enzymes: No results found for this basename:  CKTOTAL:3,CKMB:3,CKMBINDEX:3,TROPONINI:3 in the last 168 hours BNP: No results found for this basename: PROBNP:3 in the last 168 hours D-Dimer: No results found for this basename: DDIMER:2 in the last 168 hours CBG: No results found for this basename: GLUCAP:6 in the last 168 hours Hemoglobin A1C: No results found for this basename: HGBA1C in the last 168 hours Fasting Lipid Panel: No results found for this basename: CHOL,HDL,LDLCALC,TRIG,CHOLHDL,LDLDIRECT in the last 960 hours Thyroid Function Tests:  Lab 02/27/12 2058  TSH 0.986  T4TOTAL --  FREET4 --  T3FREE --  THYROIDAB --   Coagulation: No results found for this basename: LABPROT:4,INR:4 in the last 168 hours Anemia Panel: No results found for this basename: VITAMINB12,FOLATE,FERRITIN,TIBC,IRON,RETICCTPCT in the last 168 hours Urine Drug Screen: Drugs of Abuse  No results found for this basename: labopia,  cocainscrnur,  labbenz,  amphetmu,  thcu,  labbarb    Alcohol Level: No results found for this basename: ETH:2 in the last 168 hours Urinalysis:  Lab 02/27/12 1715  COLORURINE YELLOW  LABSPEC 1.010  PHURINE 6.0  GLUCOSEU NEGATIVE  HGBUR MODERATE*  BILIRUBINUR NEGATIVE  KETONESUR NEGATIVE  PROTEINUR NEGATIVE  UROBILINOGEN 0.2  NITRITE NEGATIVE  LEUKOCYTESUR SMALL*   Micro Results: Recent  Results (from the past 240 hour(s))  URINE CULTURE     Status: Normal   Collection Time   02/27/12  5:15 PM      Component Value Range Status Comment   Specimen Description URINE, CLEAN CATCH   Final    Special Requests NONE   Final    Culture  Setup Time 02/28/2012 03:22   Final    Colony Count >=100,000 COLONIES/ML   Final    Culture ESCHERICHIA COLI   Final    Report Status 03/01/2012 FINAL   Final    Organism ID, Bacteria ESCHERICHIA COLI   Final    Studies/Results: No results found. Scheduled Meds:    . antiseptic oral rinse  15 mL Mouth Rinse BID  . bisacodyl  10 mg Oral Once  . cefTRIAXone (ROCEPHIN)  IV   1 g Intravenous Q24H  . dorzolamide  1 drop Both Eyes BID   And  . timolol  1 drop Both Eyes BID  . enoxaparin (LOVENOX) injection  60 mg Subcutaneous Q24H  . furosemide  80 mg Oral BID  . latanoprost  1 drop Both Eyes QHS  . metoprolol succinate  12.5 mg Oral q morning - 10a  . pantoprazole  40 mg Oral Q1200  . phenazopyridine  200 mg Oral TID WC  . polyethylene glycol  17 g Oral BID  . senna-docusate  1 tablet Oral BID  . Tamsulosin HCl  0.4 mg Oral QHS   Continuous Infusions:  PRN Meds:.acetaminophen, Muscle Rub, ondansetron (ZOFRAN) IV, oxyCODONE, traZODone, DISCONTD: bisacodyl, DISCONTD:  HYDROmorphone (DILAUDID) injection Assessment/Plan: Principal Problem:  *UTI (lower urinary tract infection), Escherichia coli, pan-sensitive. Post void residual bladder scan was 0. Change to by mouth antibiotics. Active Problems:  Thrombocytopenia worsening. Discontinue Lovenox and check HIT screen.  BPH (benign prostatic hyperplasia)  Constipation: No results with MiraLAX Dulcolax and Senokot. Will give an enema today.  Edema: Continue Lasix.  HYPERTENSION, controlled.  GASTROESOPHAGEAL REFLUX DISEASE, CHRONIC  Hypothyroidism  Obesity  Hyperlipidemia Right hand IV infiltration: Keep elevated, warm compress. Monitor. Keep the IV out. No reason for PICC.   LOS: 3 days   Ramaj Frangos L 03/01/2012, 10:44 AM

## 2012-03-01 NOTE — Progress Notes (Signed)
Pt given fleet enema. Pt passed medium hard stool. Pt c/o blood on toilet paper. Small amount viewed on toilet paper, pt has Hemorrhoid that was irritated by fleet enema procedure.

## 2012-03-01 NOTE — Progress Notes (Signed)
Patient called nurse to room.  Patient state he is having some pain and nausea after eating lunch.  Rates pain as 8 of 10 in Left side of abdomen and also has some aching in his scrotal area.  Patient up to void - urine has been orange d/t pyridium, but patient states that toward end of stream, urine became more red, like blood.  Patient flushed urine.  Will give pain and nausea meds.  Instructed patient to use urinal next time when voiding so nurse can assess urine color.

## 2012-03-02 DIAGNOSIS — N400 Enlarged prostate without lower urinary tract symptoms: Secondary | ICD-10-CM | POA: Diagnosis not present

## 2012-03-02 DIAGNOSIS — N390 Urinary tract infection, site not specified: Secondary | ICD-10-CM | POA: Diagnosis not present

## 2012-03-02 DIAGNOSIS — R609 Edema, unspecified: Secondary | ICD-10-CM | POA: Diagnosis not present

## 2012-03-02 LAB — BASIC METABOLIC PANEL
BUN: 12 mg/dL (ref 6–23)
Calcium: 9.2 mg/dL (ref 8.4–10.5)
GFR calc non Af Amer: 81 mL/min — ABNORMAL LOW (ref >90)
Glucose, Bld: 117 mg/dL — ABNORMAL HIGH (ref 70–99)
Potassium: 4 mEq/L (ref 3.5–5.1)

## 2012-03-02 MED ORDER — CEPHALEXIN 500 MG PO CAPS: 500.0000 mg | ORAL_CAPSULE | Freq: Two times a day (BID) | ORAL | Status: DC

## 2012-03-02 NOTE — Discharge Summary (Signed)
Physician Discharge Summary  Patient ID: [name] [name]. MRN: [id number] DOB/AGE: 25-Jun-1940 71 y.o.  Admit date: 02/27/2012 Discharge date: 03/02/2012  Discharge Diagnoses:  Principal Problem:  *UTI (lower urinary tract infection), pansensitive Escherichia coli. Active Problems:  Thrombocytopenia  BPH (benign prostatic hyperplasia)  Constipation  Edema  HYPERTENSION  GASTROESOPHAGEAL REFLUX DISEASE, CHRONIC  Hypothyroidism  Obesity  Hyperlipidemia  SIRS (systemic inflammatory response syndrome)     Medication List     As of 03/02/2012  8:50 AM    TAKE these medications         acetaminophen 325 MG tablet   Commonly known as: TYLENOL   Take 650 mg by mouth every 6 (six) hours as needed. Pain      cephALEXin 500 MG capsule   Commonly known as: KEFLEX   Take 1 capsule (500 mg total) by mouth 2 (two) times daily.      Dorzolamide HCl-Timolol Mal PF 22.3-6.8 MG/ML Soln   Apply 1 drop to eye 2 (two) times daily. Dorzolamide HCI-Timolol Maleate Ophthalmic Solution 2.23%/0.68%      furosemide 80 MG tablet   Commonly known as: LASIX   Take 80 mg by mouth 2 (two) times daily.      HYDROcodone-acetaminophen 10-325 MG per tablet   Commonly known as: NORCO   Take 0.5-1 tablets by mouth every 6 (six) hours as needed. For Pain. *Do not exceed more than 4 tablets per day*      latanoprost 0.005 % ophthalmic solution   Commonly known as: XALATAN   Place 1 drop into both eyes at bedtime.      metoprolol succinate 25 MG 24 hr tablet   Commonly known as: TOPROL-XL   Take 12.5 mg by mouth every morning.      omeprazole 20 MG capsule   Commonly known as: PRILOSEC   Take 1 capsule (20 mg total) by mouth daily. 30 minutes before first meal      ondansetron 4 MG tablet   Commonly known as: ZOFRAN   Take 4 mg by mouth every 8 (eight) hours as needed. For nausea      OXYGEN-HELIUM IN   Inhale 2 mLs into the lungs 2 (two) times daily.      RESTORA Caps   Take 1 capsule  by mouth every morning.      senna-docusate 8.6-50 MG per tablet   Commonly known as: Senokot-S   Take 1 tablet by mouth daily.      Tamsulosin HCl 0.4 MG Caps   Commonly known as: FLOMAX   Take 0.4 mg by mouth at bedtime.      trolamine salicylate 10 % cream   Commonly known as: ASPERCREME   Apply 1 application topically as needed. *Applied to knee(s)*            Discharge Orders    Future Appointments: Provider: Department: Dept Phone: Center:   03/14/2012 1:30 PM Nira Retort, NP [facility] [phone] [facility]   07/02/2012 9:30 AM [facility] [phone] None   07/04/2012 11:30 AM Randall An, MD [facility] [phone] None     Future Orders Please Complete By Expires   Diet - low sodium heart healthy      Activity as tolerated - No restrictions         Follow-up Information    Follow up with Cassell Smiles., MD. (If symptoms worsen)    Contact information:   [address]   [place] [address] [phone]          Disposition: 01-Home or Self Care  Discharged Condition: stable  Consults:  none  Labs:   Results for orders placed during the hospital encounter of 02/27/12 (from the past 48 hour(s))  CBC     Status: Abnormal   Collection Time   03/01/12  4:49 AM      Component Value Range Comment   WBC 7.3  4.0 - 10.5 K/uL    RBC 5.04  4.22 - 5.81 MIL/uL    Hemoglobin 13.5  13.0 - 17.0 g/dL    HCT 21.3  08.6 - 57.8 %    MCV 78.6  78.0 - 100.0 fL    MCH 26.8  26.0 - 34.0 pg    MCHC 34.1  30.0 - 36.0 g/dL    RDW 46.9 (*) 62.9 - 15.5 %    Platelets 82 (*) 150 - 400 K/uL   BASIC METABOLIC PANEL     Status: Abnormal   Collection Time   03/02/12  5:14 AM      Component Value Range Comment   Sodium 134 (*) 135 - 145 mEq/L    Potassium 4.0  3.5 - 5.1 mEq/L    Chloride 98  96 - 112 mEq/L    CO2 30  19 - 32 mEq/L    Glucose, Bld 117 (*) 70 - 99 mg/dL    BUN 12  6 - 23 mg/dL    Creatinine, Ser  5.28  0.50 - 1.35 mg/dL    Calcium 9.2  8.4 - 41.3 mg/dL    GFR calc non Af Amer 81 (*) >90 mL/min    GFR calc Af Amer >90  >90 mL/min     Diagnostics:  Ct Abdomen Pelvis Wo Contrast  02/27/2012  *RADIOLOGY REPORT*  Clinical Data: Left chest pain  CT ABDOMEN AND PELVIS WITHOUT CONTRAST  Technique:  Multidetector CT imaging of the abdomen and pelvis was performed following the standard protocol without intravenous contrast.  Comparison: 07/24/2010  Findings: Stable innumerable pelvic and cortical cysts in the kidneys.  Stable bilateral perinephric stranding.  No renal calculus.  No ureteral calculus.  Ureters are normal in caliber.  Normal appendix.  Post cholecystectomy  Unenhanced liver, spleen, pancreas, adrenal glands are within normal limits.  Bladder is decompressed.  Unremarkable prostate gland.  Spinal stenosis is multifactorial and L4-5.  Prominent facet arthropathy.  IMPRESSION: No evidence of urinary obstruction or urinary calculus.  Stable study.   Original Report Authenticated By: Donavan Burnet, M.D.    Dg Chest 2 View  02/27/2012  *RADIOLOGY REPORT*  Clinical Data: Cough, shortness of breath, fever  CHEST - 2 VIEW  Comparison: 12/26/2011; 07/23/2010; 07/12/2010; chest CT - 12/26/2011  Findings:  Grossly unchanged cardiac silhouette and enlarged mediastinal contours given persistently reduced lung volumes.  There is chronic mild elevation of the right hemidiaphragm.  Perihilar heterogeneous opacities are grossly unchanged.  Improved aeration of the right lower lung.  No new focal airspace opacities.  No definite pleural effusion or pneumothorax.  Surgical clips overlie the right upper abdomen.  Post left sided rotator cuff repair.  IMPRESSION: Grossly unchanged decreased lung volumes with perihilar atelectasis/scar without definite acute cardiopulmonary disease.   Original Report Authenticated By: Waynard Reeds, M.D.    Full Code   Hospital Course: See H&P for complete admission details.  The patient is a 71 year old black male who presented with dysuria. He had a temperature of 103. He had  left flank pain and chronic anterior abdominal pain. His blood pressure and pulse were normal. He was in no acute distress. He had left CVA tenderness. Mild left upper quadrant tenderness. His white blood cell count was 22,000. Platelet count 100K. Urinalysis consistent with UTI. CT of the abdomen and pelvis without contrast showed no evidence of urinary obstruction or calculus. The patient was admitted to the hospitalist service. Started on IV fluids and ceftriaxone. His symptoms gradually improved. By the time of discharge, his pain was much better. His dysuria was improved. He was ambulating, tolerating a diet and afebrile. His white blood cell count had normalized. Total time on the day of discharge greater than 30 minutes.  Discharge Exam:  Blood pressure 120/65, pulse 75, temperature 97.4 F (36.3 C), temperature source Oral, resp. rate 20, height 5\' 9"  (1.753 m), weight 129.774 kg (286 lb 1.6 oz), SpO2 94.00%.  Unchanged from 03/01/12  Signed: Crista Curb L 03/02/2012, 8:50 AM

## 2012-03-02 NOTE — Progress Notes (Signed)
Discharge Summary: a/o.vss. No iv access. Up ad lib. No complaints of any distress. Discharge instructions given. Prescriptions given. Pt verbalized understanding of instructions. Awaiting for family to arrive for discharge.

## 2012-03-05 LAB — HEPARIN INDUCED THROMBOCYTOPENIA PNL
Heparin Induced Plt Ab: NEGATIVE
UFH Low Dose 0.1 IU/mL: 0 % Release
UFH SRA Result: NEGATIVE

## 2012-03-12 ENCOUNTER — Encounter: Payer: Self-pay | Admitting: Gastroenterology

## 2012-03-12 NOTE — Progress Notes (Signed)
Received path from EGD. Hyperplastic polyp, negative H.pylori.

## 2012-03-14 ENCOUNTER — Encounter: Payer: Self-pay | Admitting: Gastroenterology

## 2012-03-14 ENCOUNTER — Ambulatory Visit (INDEPENDENT_AMBULATORY_CARE_PROVIDER_SITE_OTHER): Payer: Medicare Other | Admitting: Gastroenterology

## 2012-03-14 VITALS — BP 126/78 | HR 74 | Temp 97.8°F | Ht 68.0 in | Wt 283.6 lb

## 2012-03-14 DIAGNOSIS — R141 Gas pain: Secondary | ICD-10-CM

## 2012-03-14 DIAGNOSIS — Z86010 Personal history of colonic polyps: Secondary | ICD-10-CM | POA: Diagnosis not present

## 2012-03-14 DIAGNOSIS — R143 Flatulence: Secondary | ICD-10-CM | POA: Diagnosis not present

## 2012-03-14 DIAGNOSIS — K219 Gastro-esophageal reflux disease without esophagitis: Secondary | ICD-10-CM | POA: Diagnosis not present

## 2012-03-14 MED ORDER — ONDANSETRON HCL 4 MG PO TABS: 4.0000 mg | ORAL_TABLET | Freq: Three times a day (TID) | ORAL | Status: DC | PRN

## 2012-03-14 NOTE — Assessment & Plan Note (Signed)
Surveillance Feb 2017.

## 2012-03-14 NOTE — Assessment & Plan Note (Signed)
Return in 6 mos. Continue Protonix. Doing well.

## 2012-03-14 NOTE — Progress Notes (Signed)
Referring Provider: Elfredia Nevins, MD Primary Care Physician:  Cassell Smiles., MD Primary Gastroenterologist: Dr. Jena Gauss   Chief Complaint  Patient presents with  . Follow-up    HPI:   Returns today in routine f/u. hx of chronic nausea, abdominal bloating, pain ,diarrhea. Extensive work-up completed. GES normal. EGD and TCS on file from last year. Serial CT scans performed. Recent [facility]GD completed at [facility] Aug 2013, no significant findings. Treated empirically for possible SBBO with Augmentin.   Augmentin made very nauseated. Took about 10 days but just didn't work out. Feels great today. Denies bloating, abdominal pain. Taking probiotics now. States working well. If feels like stomach getting upset, will take a probiotic. Wakes up in morning, may be a little nauseated, takes "nausea pill" on empty stomach then eats breakfast later.   Down 3 lbs from last visit.   Past Medical History  Diagnosis Date  . CHF (congestive heart failure)   . Hypertension   . Obesity   . Hyperlipidemia   . PUD (peptic ulcer disease)   . GERD (gastroesophageal reflux disease)   . S/P endoscopy 2007, 2012    2007: nl, May 2012: antral erosions  . Osteoarthritis   . S/P colonoscopy 2007, Feb and March 2012    hx of adenomas, left-sided diverticula, On 07/2010 TCS, fresh blood and clot noted coming from TI.  Marland Kitchen Anxiety   . Thyroid disease     hypothyroid  . Ringing in ears   . History of kidney stones   . History of bladder infections   . Heel spur     right heel  . Collagen vascular disease   . Thrombocytopenia 12/30/2011    Stable  . BPH (benign prostatic hyperplasia)     Past Surgical History  Procedure Date  . Knee arthroscopy   . Rotator cuff repair   . Cholecystectomy   . Lumbar spine surgery   . Small bowel capsule study 07/2011    ?transient focal ischemia in distal small bowel to explain GI bleeding  . Esophagogastroduodenoscopy Aug 2013    [facility], single 5mm pedunculated polyp otherwise  normal. HYPERPLASTIC, NEGATIVE h.pylori    Current Outpatient Prescriptions  Medication Sig Dispense Refill  . acetaminophen (TYLENOL) 325 MG tablet Take 650 mg by mouth every 6 (six) hours as needed. Pain      . cephALEXin (KEFLEX) 500 MG capsule Take 1 capsule (500 mg total) by mouth 2 (two) times daily.  12 capsule  0  . Dorzolamide HCl-Timolol Mal PF 22.3-6.8 MG/ML SOLN Apply 1 drop to eye 2 (two) times daily. Dorzolamide HCI-Timolol Maleate Ophthalmic Solution 2.23%/0.68%      . furosemide (LASIX) 80 MG tablet Take 80 mg by mouth 2 (two) times daily.       Marland Kitchen HYDROcodone-acetaminophen (NORCO) 10-325 MG per tablet Take 0.5-1 tablets by mouth every 6 (six) hours as needed. For Pain. *Do not exceed more than 4 tablets per day*      . latanoprost (XALATAN) 0.005 % ophthalmic solution Place 1 drop into both eyes at bedtime.      . metoprolol succinate (TOPROL-XL) 25 MG 24 hr tablet Take 12.5 mg by mouth every morning.       . ondansetron (ZOFRAN) 4 MG tablet Take 4 mg by mouth every 8 (eight) hours as needed. For nausea      . OXYGEN-HELIUM IN Inhale 2 mLs into the lungs 2 (two) times daily.      . pantoprazole (PROTONIX) 40 MG tablet Take  40 mg by mouth daily.       . Probiotic Product (RESTORA) CAPS Take 1 capsule by mouth every morning.       . senna-docusate (SENOKOT-S) 8.6-50 MG per tablet Take 1 tablet by mouth daily.      . Tamsulosin HCl (FLOMAX) 0.4 MG CAPS Take 0.4 mg by mouth at bedtime.       . trolamine salicylate (ASPERCREME) 10 % cream Apply 1 application topically as needed. *Applied to knee(s)*      . omeprazole (PRILOSEC) 20 MG capsule Take 1 capsule (20 mg total) by mouth daily. 30 minutes before first meal  30 capsule  5    Allergies as of 03/14/2012 - Review Complete 03/14/2012  Allergen Reaction Noted  . Aspirin Other (See Comments)     Family History  Problem Relation Age of Onset  . Colon cancer Father 10    deceased  . Prostate cancer Father   . Pancreatic  cancer Mother 63    deceased    History   Social History  . Marital Status: Married    Spouse Name: N/A    Number of Children: N/A  . Years of Education: N/A   Occupational History  . disability    Social History Main Topics  . Smoking status: Former Smoker    Types: Cigars    Quit date: 08/10/1973  . Smokeless tobacco: Never Used  . Alcohol Use: No  . Drug Use: No  . Sexually Active: No   Other Topics Concern  . None   Social History Narrative  . None    Review of Systems: Gen: SEE HPI CV: Denies chest pain, palpitations, syncope, peripheral edema, and claudication. Resp: +DOE  GI: Denies vomiting blood, jaundice, and fecal incontinence.   Denies dysphagia or odynophagia. Derm: Denies rash, itching, dry skin Psych: Denies depression, anxiety, memory loss, confusion. No homicidal or suicidal ideation.  Heme: Denies bruising, bleeding, and enlarged lymph nodes.  Physical Exam: BP 126/78  Pulse 74  Temp 97.8 F (36.6 C) (Temporal)  Ht 5\' 8"  (1.727 m)  Wt 283 lb 9.6 oz (128.64 kg)  BMI 43.12 kg/m2 General:   Alert and oriented. No distress noted. Pleasant and cooperative.  Head:  Normocephalic and atraumatic. Eyes:  Conjuctiva clear without scleral icterus. Mouth:  Oral mucosa pink and moist. Good dentition. No lesions. Abdomen:  +BS, soft, non-tender and non-distended. No rebound or guarding. No HSM or masses noted. Large, obese.  Msk:  Symmetrical without gross deformities. Normal posture. Extremities:  Chronic 2+ lower extremity edema Neurologic:Alert and  oriented x4;  grossly normal neurologically. Skin:  Intact without significant lesions or rashes. Psych:  Alert and cooperative. Normal mood and affect.

## 2012-03-14 NOTE — Assessment & Plan Note (Addendum)
Multifactorial. Extensive work-up as above. Empirically treated for SBBO but did not tolerate full dosing of Augmentin. If recurrent abdominal pain/bloating/diarrhea, consider different regimen of treatment. EGD up to date, TCS in Feb 2017. Multiple CTs on file. Improvement with Probiotic. Continue daily and provide samples as necessary.  Return in 6 mos .

## 2012-03-14 NOTE — Patient Instructions (Addendum)
Continue Protonix and your Probiotic daily. Some other examples of a probiotic include Digestive Advantage, [employer]'s Colon health, Align,   We will see you back in 6 months or sooner if needed!

## 2012-03-14 NOTE — Progress Notes (Signed)
Faxed to PCP

## 2012-03-21 DIAGNOSIS — S81009A Unspecified open wound, unspecified knee, initial encounter: Secondary | ICD-10-CM | POA: Diagnosis not present

## 2012-03-21 DIAGNOSIS — Z125 Encounter for screening for malignant neoplasm of prostate: Secondary | ICD-10-CM | POA: Diagnosis not present

## 2012-03-21 DIAGNOSIS — E785 Hyperlipidemia, unspecified: Secondary | ICD-10-CM | POA: Diagnosis not present

## 2012-03-21 DIAGNOSIS — Z6841 Body Mass Index (BMI) 40.0 and over, adult: Secondary | ICD-10-CM | POA: Diagnosis not present

## 2012-03-21 DIAGNOSIS — I10 Essential (primary) hypertension: Secondary | ICD-10-CM | POA: Diagnosis not present

## 2012-03-21 DIAGNOSIS — Z79899 Other long term (current) drug therapy: Secondary | ICD-10-CM | POA: Diagnosis not present

## 2012-03-21 DIAGNOSIS — Z23 Encounter for immunization: Secondary | ICD-10-CM | POA: Diagnosis not present

## 2012-03-21 DIAGNOSIS — E669 Obesity, unspecified: Secondary | ICD-10-CM | POA: Diagnosis not present

## 2012-03-21 DIAGNOSIS — E782 Mixed hyperlipidemia: Secondary | ICD-10-CM | POA: Diagnosis not present

## 2012-03-21 DIAGNOSIS — R0602 Shortness of breath: Secondary | ICD-10-CM | POA: Diagnosis not present

## 2012-03-21 DIAGNOSIS — Z0000 Encounter for general adult medical examination without abnormal findings: Secondary | ICD-10-CM | POA: Diagnosis not present

## 2012-03-29 DIAGNOSIS — Z886 Allergy status to analgesic agent status: Secondary | ICD-10-CM | POA: Diagnosis not present

## 2012-03-29 DIAGNOSIS — L97809 Non-pressure chronic ulcer of other part of unspecified lower leg with unspecified severity: Secondary | ICD-10-CM | POA: Diagnosis not present

## 2012-03-29 DIAGNOSIS — T799XXS Unspecified early complication of trauma, sequela: Secondary | ICD-10-CM | POA: Diagnosis not present

## 2012-03-29 DIAGNOSIS — I509 Heart failure, unspecified: Secondary | ICD-10-CM | POA: Diagnosis not present

## 2012-03-29 DIAGNOSIS — S8010XA Contusion of unspecified lower leg, initial encounter: Secondary | ICD-10-CM | POA: Diagnosis not present

## 2012-03-29 DIAGNOSIS — I10 Essential (primary) hypertension: Secondary | ICD-10-CM | POA: Diagnosis not present

## 2012-03-29 DIAGNOSIS — R609 Edema, unspecified: Secondary | ICD-10-CM | POA: Diagnosis not present

## 2012-03-29 DIAGNOSIS — I872 Venous insufficiency (chronic) (peripheral): Secondary | ICD-10-CM | POA: Diagnosis not present

## 2012-03-29 DIAGNOSIS — Z888 Allergy status to other drugs, medicaments and biological substances status: Secondary | ICD-10-CM | POA: Diagnosis not present

## 2012-03-29 DIAGNOSIS — I83009 Varicose veins of unspecified lower extremity with ulcer of unspecified site: Secondary | ICD-10-CM | POA: Diagnosis not present

## 2012-03-29 DIAGNOSIS — L97909 Non-pressure chronic ulcer of unspecified part of unspecified lower leg with unspecified severity: Secondary | ICD-10-CM | POA: Diagnosis not present

## 2012-03-29 DIAGNOSIS — I87319 Chronic venous hypertension (idiopathic) with ulcer of unspecified lower extremity: Secondary | ICD-10-CM | POA: Diagnosis not present

## 2012-03-29 DIAGNOSIS — G4730 Sleep apnea, unspecified: Secondary | ICD-10-CM | POA: Diagnosis not present

## 2012-03-29 DIAGNOSIS — Z809 Family history of malignant neoplasm, unspecified: Secondary | ICD-10-CM | POA: Diagnosis not present

## 2012-03-29 DIAGNOSIS — Z79899 Other long term (current) drug therapy: Secondary | ICD-10-CM | POA: Diagnosis not present

## 2012-04-05 DIAGNOSIS — T799XXS Unspecified early complication of trauma, sequela: Secondary | ICD-10-CM | POA: Diagnosis not present

## 2012-04-05 DIAGNOSIS — I872 Venous insufficiency (chronic) (peripheral): Secondary | ICD-10-CM | POA: Diagnosis not present

## 2012-04-05 DIAGNOSIS — I509 Heart failure, unspecified: Secondary | ICD-10-CM | POA: Diagnosis not present

## 2012-04-05 DIAGNOSIS — L97909 Non-pressure chronic ulcer of unspecified part of unspecified lower leg with unspecified severity: Secondary | ICD-10-CM | POA: Diagnosis not present

## 2012-04-05 DIAGNOSIS — I10 Essential (primary) hypertension: Secondary | ICD-10-CM | POA: Diagnosis not present

## 2012-04-05 DIAGNOSIS — R609 Edema, unspecified: Secondary | ICD-10-CM | POA: Diagnosis not present

## 2012-04-05 DIAGNOSIS — L97809 Non-pressure chronic ulcer of other part of unspecified lower leg with unspecified severity: Secondary | ICD-10-CM | POA: Diagnosis not present

## 2012-04-12 DIAGNOSIS — I83009 Varicose veins of unspecified lower extremity with ulcer of unspecified site: Secondary | ICD-10-CM | POA: Diagnosis not present

## 2012-04-12 DIAGNOSIS — R609 Edema, unspecified: Secondary | ICD-10-CM | POA: Diagnosis not present

## 2012-04-12 DIAGNOSIS — I872 Venous insufficiency (chronic) (peripheral): Secondary | ICD-10-CM | POA: Diagnosis not present

## 2012-04-12 DIAGNOSIS — L97809 Non-pressure chronic ulcer of other part of unspecified lower leg with unspecified severity: Secondary | ICD-10-CM | POA: Diagnosis not present

## 2012-04-12 DIAGNOSIS — T799XXS Unspecified early complication of trauma, sequela: Secondary | ICD-10-CM | POA: Diagnosis not present

## 2012-04-12 DIAGNOSIS — L97909 Non-pressure chronic ulcer of unspecified part of unspecified lower leg with unspecified severity: Secondary | ICD-10-CM | POA: Diagnosis not present

## 2012-04-12 DIAGNOSIS — I509 Heart failure, unspecified: Secondary | ICD-10-CM | POA: Diagnosis not present

## 2012-04-12 DIAGNOSIS — I10 Essential (primary) hypertension: Secondary | ICD-10-CM | POA: Diagnosis not present

## 2012-04-18 DIAGNOSIS — L97809 Non-pressure chronic ulcer of other part of unspecified lower leg with unspecified severity: Secondary | ICD-10-CM | POA: Diagnosis not present

## 2012-04-18 DIAGNOSIS — T799XXS Unspecified early complication of trauma, sequela: Secondary | ICD-10-CM | POA: Diagnosis not present

## 2012-04-18 DIAGNOSIS — I509 Heart failure, unspecified: Secondary | ICD-10-CM | POA: Diagnosis not present

## 2012-04-18 DIAGNOSIS — I872 Venous insufficiency (chronic) (peripheral): Secondary | ICD-10-CM | POA: Diagnosis not present

## 2012-04-18 DIAGNOSIS — I10 Essential (primary) hypertension: Secondary | ICD-10-CM | POA: Diagnosis not present

## 2012-04-18 DIAGNOSIS — R609 Edema, unspecified: Secondary | ICD-10-CM | POA: Diagnosis not present

## 2012-04-24 DIAGNOSIS — J069 Acute upper respiratory infection, unspecified: Secondary | ICD-10-CM | POA: Diagnosis not present

## 2012-04-24 DIAGNOSIS — Z6841 Body Mass Index (BMI) 40.0 and over, adult: Secondary | ICD-10-CM | POA: Diagnosis not present

## 2012-04-25 DIAGNOSIS — IMO0001 Reserved for inherently not codable concepts without codable children: Secondary | ICD-10-CM | POA: Diagnosis not present

## 2012-04-25 DIAGNOSIS — L97909 Non-pressure chronic ulcer of unspecified part of unspecified lower leg with unspecified severity: Secondary | ICD-10-CM | POA: Diagnosis not present

## 2012-04-25 DIAGNOSIS — I83009 Varicose veins of unspecified lower extremity with ulcer of unspecified site: Secondary | ICD-10-CM | POA: Diagnosis not present

## 2012-04-25 DIAGNOSIS — I87319 Chronic venous hypertension (idiopathic) with ulcer of unspecified lower extremity: Secondary | ICD-10-CM | POA: Diagnosis not present

## 2012-04-25 DIAGNOSIS — L97809 Non-pressure chronic ulcer of other part of unspecified lower leg with unspecified severity: Secondary | ICD-10-CM | POA: Diagnosis not present

## 2012-04-25 DIAGNOSIS — I872 Venous insufficiency (chronic) (peripheral): Secondary | ICD-10-CM | POA: Diagnosis not present

## 2012-05-02 DIAGNOSIS — I83009 Varicose veins of unspecified lower extremity with ulcer of unspecified site: Secondary | ICD-10-CM | POA: Diagnosis not present

## 2012-05-14 DIAGNOSIS — M25569 Pain in unspecified knee: Secondary | ICD-10-CM | POA: Diagnosis not present

## 2012-05-14 DIAGNOSIS — M545 Low back pain, unspecified: Secondary | ICD-10-CM | POA: Diagnosis not present

## 2012-05-14 DIAGNOSIS — M1710 Unilateral primary osteoarthritis, unspecified knee: Secondary | ICD-10-CM | POA: Diagnosis not present

## 2012-05-14 DIAGNOSIS — M5137 Other intervertebral disc degeneration, lumbosacral region: Secondary | ICD-10-CM | POA: Diagnosis not present

## 2012-05-14 DIAGNOSIS — G894 Chronic pain syndrome: Secondary | ICD-10-CM | POA: Diagnosis not present

## 2012-05-14 DIAGNOSIS — IMO0002 Reserved for concepts with insufficient information to code with codable children: Secondary | ICD-10-CM | POA: Diagnosis not present

## 2012-05-21 DIAGNOSIS — M5137 Other intervertebral disc degeneration, lumbosacral region: Secondary | ICD-10-CM | POA: Diagnosis not present

## 2012-05-21 DIAGNOSIS — IMO0002 Reserved for concepts with insufficient information to code with codable children: Secondary | ICD-10-CM | POA: Diagnosis not present

## 2012-05-21 DIAGNOSIS — G894 Chronic pain syndrome: Secondary | ICD-10-CM | POA: Diagnosis not present

## 2012-05-28 DIAGNOSIS — H409 Unspecified glaucoma: Secondary | ICD-10-CM | POA: Diagnosis not present

## 2012-05-28 DIAGNOSIS — H4011X0 Primary open-angle glaucoma, stage unspecified: Secondary | ICD-10-CM | POA: Diagnosis not present

## 2012-05-28 DIAGNOSIS — H2510 Age-related nuclear cataract, unspecified eye: Secondary | ICD-10-CM | POA: Diagnosis not present

## 2012-05-31 DIAGNOSIS — N400 Enlarged prostate without lower urinary tract symptoms: Secondary | ICD-10-CM | POA: Diagnosis not present

## 2012-06-04 DIAGNOSIS — N400 Enlarged prostate without lower urinary tract symptoms: Secondary | ICD-10-CM | POA: Diagnosis not present

## 2012-06-11 DIAGNOSIS — N400 Enlarged prostate without lower urinary tract symptoms: Secondary | ICD-10-CM | POA: Diagnosis not present

## 2012-06-14 ENCOUNTER — Encounter (HOSPITAL_COMMUNITY): Payer: Self-pay | Admitting: Emergency Medicine

## 2012-06-14 ENCOUNTER — Emergency Department (HOSPITAL_COMMUNITY): Payer: Medicare Other

## 2012-06-14 ENCOUNTER — Inpatient Hospital Stay (HOSPITAL_COMMUNITY)
Admission: EM | Admit: 2012-06-14 | Discharge: 2012-06-17 | DRG: 603 | Disposition: A | Discharge status: Home Health | Payer: Medicare Other | Attending: Internal Medicine | Admitting: Internal Medicine

## 2012-06-14 DIAGNOSIS — M7730 Calcaneal spur, unspecified foot: Secondary | ICD-10-CM | POA: Diagnosis not present

## 2012-06-14 DIAGNOSIS — Z6841 Body Mass Index (BMI) 40.0 and over, adult: Secondary | ICD-10-CM

## 2012-06-14 DIAGNOSIS — M310 Hypersensitivity angiitis: Secondary | ICD-10-CM | POA: Diagnosis present

## 2012-06-14 DIAGNOSIS — F411 Generalized anxiety disorder: Secondary | ICD-10-CM | POA: Diagnosis present

## 2012-06-14 DIAGNOSIS — K7689 Other specified diseases of liver: Secondary | ICD-10-CM

## 2012-06-14 DIAGNOSIS — N138 Other obstructive and reflux uropathy: Secondary | ICD-10-CM | POA: Diagnosis not present

## 2012-06-14 DIAGNOSIS — R609 Edema, unspecified: Secondary | ICD-10-CM | POA: Diagnosis not present

## 2012-06-14 DIAGNOSIS — I509 Heart failure, unspecified: Secondary | ICD-10-CM | POA: Diagnosis present

## 2012-06-14 DIAGNOSIS — R141 Gas pain: Secondary | ICD-10-CM

## 2012-06-14 DIAGNOSIS — L03115 Cellulitis of right lower limb: Secondary | ICD-10-CM

## 2012-06-14 DIAGNOSIS — K625 Hemorrhage of anus and rectum: Secondary | ICD-10-CM

## 2012-06-14 DIAGNOSIS — N401 Enlarged prostate with lower urinary tract symptoms: Secondary | ICD-10-CM | POA: Diagnosis present

## 2012-06-14 DIAGNOSIS — Z87891 Personal history of nicotine dependence: Secondary | ICD-10-CM | POA: Diagnosis not present

## 2012-06-14 DIAGNOSIS — N400 Enlarged prostate without lower urinary tract symptoms: Secondary | ICD-10-CM

## 2012-06-14 DIAGNOSIS — R300 Dysuria: Secondary | ICD-10-CM | POA: Diagnosis present

## 2012-06-14 DIAGNOSIS — Z860100 Personal history of colon polyps, unspecified: Secondary | ICD-10-CM

## 2012-06-14 DIAGNOSIS — Z87442 Personal history of urinary calculi: Secondary | ICD-10-CM

## 2012-06-14 DIAGNOSIS — Z8042 Family history of malignant neoplasm of prostate: Secondary | ICD-10-CM | POA: Diagnosis not present

## 2012-06-14 DIAGNOSIS — E669 Obesity, unspecified: Secondary | ICD-10-CM

## 2012-06-14 DIAGNOSIS — K644 Residual hemorrhoidal skin tags: Secondary | ICD-10-CM | POA: Diagnosis present

## 2012-06-14 DIAGNOSIS — I10 Essential (primary) hypertension: Secondary | ICD-10-CM | POA: Diagnosis not present

## 2012-06-14 DIAGNOSIS — N390 Urinary tract infection, site not specified: Secondary | ICD-10-CM

## 2012-06-14 DIAGNOSIS — L03119 Cellulitis of unspecified part of limb: Secondary | ICD-10-CM | POA: Diagnosis not present

## 2012-06-14 DIAGNOSIS — R143 Flatulence: Secondary | ICD-10-CM

## 2012-06-14 DIAGNOSIS — R1010 Upper abdominal pain, unspecified: Secondary | ICD-10-CM

## 2012-06-14 DIAGNOSIS — D696 Thrombocytopenia, unspecified: Secondary | ICD-10-CM | POA: Diagnosis not present

## 2012-06-14 DIAGNOSIS — L02419 Cutaneous abscess of limb, unspecified: Secondary | ICD-10-CM | POA: Diagnosis not present

## 2012-06-14 DIAGNOSIS — E785 Hyperlipidemia, unspecified: Secondary | ICD-10-CM

## 2012-06-14 DIAGNOSIS — K5900 Constipation, unspecified: Secondary | ICD-10-CM | POA: Diagnosis not present

## 2012-06-14 DIAGNOSIS — K6389 Other specified diseases of intestine: Secondary | ICD-10-CM | POA: Diagnosis not present

## 2012-06-14 DIAGNOSIS — R194 Change in bowel habit: Secondary | ICD-10-CM

## 2012-06-14 DIAGNOSIS — K219 Gastro-esophageal reflux disease without esophagitis: Secondary | ICD-10-CM

## 2012-06-14 DIAGNOSIS — K8020 Calculus of gallbladder without cholecystitis without obstruction: Secondary | ICD-10-CM

## 2012-06-14 DIAGNOSIS — R197 Diarrhea, unspecified: Secondary | ICD-10-CM

## 2012-06-14 DIAGNOSIS — Z79899 Other long term (current) drug therapy: Secondary | ICD-10-CM | POA: Diagnosis not present

## 2012-06-14 DIAGNOSIS — M79609 Pain in unspecified limb: Secondary | ICD-10-CM | POA: Diagnosis not present

## 2012-06-14 DIAGNOSIS — R110 Nausea: Secondary | ICD-10-CM

## 2012-06-14 DIAGNOSIS — R109 Unspecified abdominal pain: Secondary | ICD-10-CM | POA: Diagnosis present

## 2012-06-14 DIAGNOSIS — E039 Hypothyroidism, unspecified: Secondary | ICD-10-CM

## 2012-06-14 DIAGNOSIS — K921 Melena: Secondary | ICD-10-CM | POA: Diagnosis not present

## 2012-06-14 DIAGNOSIS — N259 Disorder resulting from impaired renal tubular function, unspecified: Secondary | ICD-10-CM

## 2012-06-14 DIAGNOSIS — L0390 Cellulitis, unspecified: Secondary | ICD-10-CM

## 2012-06-14 DIAGNOSIS — R630 Anorexia: Secondary | ICD-10-CM

## 2012-06-14 DIAGNOSIS — R6510 Systemic inflammatory response syndrome (SIRS) of non-infectious origin without acute organ dysfunction: Secondary | ICD-10-CM

## 2012-06-14 DIAGNOSIS — M1990 Unspecified osteoarthritis, unspecified site: Secondary | ICD-10-CM | POA: Diagnosis present

## 2012-06-14 LAB — CBC WITH DIFFERENTIAL/PLATELET
Basophils Relative: 0 % (ref 0–1)
Eosinophils Absolute: 0.1 10*3/uL (ref 0.0–0.7)
Eosinophils Relative: 1 % (ref 0–5)
Hemoglobin: 13.6 g/dL (ref 13.0–17.0)
MCH: 26.9 pg (ref 26.0–34.0)
MCHC: 33 g/dL (ref 30.0–36.0)
Monocytes Relative: 5 % (ref 3–12)
Neutrophils Relative %: 85 % — ABNORMAL HIGH (ref 43–77)

## 2012-06-14 LAB — URINALYSIS, ROUTINE W REFLEX MICROSCOPIC
Bilirubin Urine: NEGATIVE
Ketones, ur: NEGATIVE mg/dL
Nitrite: NEGATIVE
Protein, ur: NEGATIVE mg/dL
Specific Gravity, Urine: 1.015 (ref 1.005–1.030)
Urobilinogen, UA: 0.2 mg/dL (ref 0.0–1.0)

## 2012-06-14 LAB — BASIC METABOLIC PANEL
BUN: 13 mg/dL (ref 6–23)
Calcium: 9.4 mg/dL (ref 8.4–10.5)
Creatinine, Ser: 1 mg/dL (ref 0.50–1.35)
GFR calc Af Amer: 85 mL/min — ABNORMAL LOW (ref >90)
GFR calc non Af Amer: 73 mL/min — ABNORMAL LOW (ref >90)
Potassium: 3.7 mEq/L (ref 3.5–5.1)

## 2012-06-14 LAB — MAGNESIUM: Magnesium: 2 mg/dL (ref 1.5–2.5)

## 2012-06-14 MED ORDER — ACETAMINOPHEN 650 MG RE SUPP: 650.0000 mg | Freq: Four times a day (QID) | RECTAL | Status: DC | PRN

## 2012-06-14 MED ORDER — DORZOLAMIDE HCL-TIMOLOL MAL PF 22.3-6.8 MG/ML OP SOLN: 1.0000 [drp] | Freq: Two times a day (BID) | OPHTHALMIC | Status: DC

## 2012-06-14 MED ORDER — RISAQUAD PO CAPS
1.0000 | ORAL_CAPSULE | Freq: Every morning | ORAL | Status: DC
  Administered 2012-06-15 – 2012-06-17 (×3): 1 via ORAL
  Filled 2012-06-14 (×3): qty 1

## 2012-06-14 MED ORDER — ACETAMINOPHEN 325 MG PO TABS: 650.0000 mg | ORAL_TABLET | Freq: Four times a day (QID) | ORAL | Status: DC | PRN

## 2012-06-14 MED ORDER — HYDROCODONE-ACETAMINOPHEN 5-325 MG PO TABS
2.0000 | ORAL_TABLET | ORAL | Status: DC | PRN
  Administered 2012-06-14 – 2012-06-17 (×9): 2 via ORAL
  Filled 2012-06-14 (×9): qty 2

## 2012-06-14 MED ORDER — ONDANSETRON HCL 4 MG/2ML IJ SOLN: 4.0000 mg | Freq: Four times a day (QID) | INTRAMUSCULAR | Status: DC | PRN

## 2012-06-14 MED ORDER — OXYMETAZOLINE HCL 0.05 % NA SOLN: 2.0000 | Freq: Every day | NASAL | Status: DC | PRN

## 2012-06-14 MED ORDER — ONDANSETRON HCL 4 MG PO TABS: 4.0000 mg | ORAL_TABLET | Freq: Three times a day (TID) | ORAL | Status: DC | PRN

## 2012-06-14 MED ORDER — PIPERACILLIN-TAZOBACTAM 3.375 G IVPB
3.3750 g | Freq: Three times a day (TID) | INTRAVENOUS | Status: DC
  Administered 2012-06-14 – 2012-06-17 (×9): 3.375 g via INTRAVENOUS
  Filled 2012-06-14 (×12): qty 50

## 2012-06-14 MED ORDER — ONDANSETRON HCL 4 MG PO TABS: 4.0000 mg | ORAL_TABLET | Freq: Four times a day (QID) | ORAL | Status: DC | PRN

## 2012-06-14 MED ORDER — SODIUM CHLORIDE 0.9 % IV SOLN: INTRAVENOUS | Status: DC

## 2012-06-14 MED ORDER — METOPROLOL SUCCINATE ER 25 MG PO TB24
12.5000 mg | ORAL_TABLET | Freq: Every morning | ORAL | Status: DC
  Administered 2012-06-15 – 2012-06-17 (×3): 12.5 mg via ORAL
  Filled 2012-06-14 (×3): qty 1

## 2012-06-14 MED ORDER — HYDROCODONE-ACETAMINOPHEN 5-325 MG PO TABS
2.0000 | ORAL_TABLET | Freq: Once | ORAL | Status: AC
  Administered 2012-06-14: 2 via ORAL
  Filled 2012-06-14: qty 2

## 2012-06-14 MED ORDER — MUSCLE RUB 10-15 % EX CREA
1.0000 "application " | TOPICAL_CREAM | CUTANEOUS | Status: DC | PRN
  Administered 2012-06-14: 1 via TOPICAL
  Filled 2012-06-14: qty 85

## 2012-06-14 MED ORDER — ONDANSETRON HCL 4 MG PO TABS
4.0000 mg | ORAL_TABLET | Freq: Once | ORAL | Status: AC
  Administered 2012-06-14: 4 mg via ORAL
  Filled 2012-06-14: qty 1

## 2012-06-14 MED ORDER — DORZOLAMIDE HCL-TIMOLOL MAL PF 22.3-6.8 MG/ML OP SOLN
1.0000 [drp] | Freq: Two times a day (BID) | OPHTHALMIC | Status: DC
  Administered 2012-06-14 – 2012-06-17 (×6): 1 [drp] via OPHTHALMIC
  Filled 2012-06-14 (×8): qty 10

## 2012-06-14 MED ORDER — ALUM & MAG HYDROXIDE-SIMETH 200-200-20 MG/5ML PO SUSP: 30.0000 mL | Freq: Four times a day (QID) | ORAL | Status: DC | PRN

## 2012-06-14 MED ORDER — VANCOMYCIN HCL IN DEXTROSE 1-5 GM/200ML-% IV SOLN
1000.0000 mg | Freq: Once | INTRAVENOUS | Status: AC
  Administered 2012-06-14: 1000 mg via INTRAVENOUS
  Filled 2012-06-14: qty 200

## 2012-06-14 MED ORDER — DOCUSATE SODIUM 100 MG PO CAPS
100.0000 mg | ORAL_CAPSULE | Freq: Two times a day (BID) | ORAL | Status: DC
  Administered 2012-06-14 – 2012-06-17 (×6): 100 mg via ORAL
  Filled 2012-06-14 (×6): qty 1

## 2012-06-14 MED ORDER — SODIUM CHLORIDE 0.9 % IV SOLN
Freq: Once | INTRAVENOUS | Status: AC
  Administered 2012-06-14: 12:00:00 via INTRAVENOUS

## 2012-06-14 MED ORDER — SODIUM CHLORIDE 0.9 % IJ SOLN
3.0000 mL | Freq: Two times a day (BID) | INTRAMUSCULAR | Status: DC
  Administered 2012-06-14 – 2012-06-16 (×3): 3 mL via INTRAVENOUS

## 2012-06-14 MED ORDER — TAMSULOSIN HCL 0.4 MG PO CAPS
0.4000 mg | ORAL_CAPSULE | Freq: Every day | ORAL | Status: DC
  Administered 2012-06-14 – 2012-06-16 (×3): 0.4 mg via ORAL
  Filled 2012-06-14 (×3): qty 1

## 2012-06-14 MED ORDER — LATANOPROST 0.005 % OP SOLN
1.0000 [drp] | Freq: Every day | OPHTHALMIC | Status: DC
  Administered 2012-06-14 – 2012-06-16 (×3): 1 [drp] via OPHTHALMIC
  Filled 2012-06-14: qty 2.5

## 2012-06-14 MED ORDER — FUROSEMIDE 10 MG/ML IJ SOLN
80.0000 mg | Freq: Two times a day (BID) | INTRAMUSCULAR | Status: DC
  Administered 2012-06-14 – 2012-06-15 (×2): 80 mg via INTRAVENOUS
  Filled 2012-06-14 (×2): qty 8

## 2012-06-14 MED ORDER — ENOXAPARIN SODIUM 60 MG/0.6ML ~~LOC~~ SOLN
60.0000 mg | SUBCUTANEOUS | Status: DC
  Administered 2012-06-14: 60 mg via SUBCUTANEOUS
  Filled 2012-06-14: qty 0.6

## 2012-06-14 MED ORDER — VANCOMYCIN HCL IN DEXTROSE 1-5 GM/200ML-% IV SOLN
1000.0000 mg | Freq: Three times a day (TID) | INTRAVENOUS | Status: DC
  Administered 2012-06-14 – 2012-06-17 (×9): 1000 mg via INTRAVENOUS
  Filled 2012-06-14 (×12): qty 200

## 2012-06-14 MED ORDER — ATORVASTATIN CALCIUM 40 MG PO TABS
80.0000 mg | ORAL_TABLET | Freq: Every day | ORAL | Status: DC
  Administered 2012-06-14 – 2012-06-16 (×3): 80 mg via ORAL
  Filled 2012-06-14 (×3): qty 2

## 2012-06-14 NOTE — Progress Notes (Signed)
06/14/12 1850 Patient stated during admission history that he and wife are on disability and sometimes have difficulty affording groceries and medications. Case management consult placed per protocol for evaluation of needs, may need medication assistance at discharge. Earnstine Regal, RN

## 2012-06-14 NOTE — ED Provider Notes (Signed)
History     CSN: [id number]  Arrival date & time 06/14/12  4098   First MD Initiated Contact with Patient 06/14/12 [phone]      Chief Complaint  Patient presents with  . Ankle Pain    (Consider location/radiation/quality/duration/timing/severity/associated sxs/prior treatment) HPI Comments: Patient states that approximately 3 days ago he was assisting someone cut would with a mall. The split piece of wood hit him in the right ankle. He noticed some bruising but attempted to" walk it off". It is of note that the patient has a problem with peripheral dependent edema. He began to notice that the area was swelling more and more and eventually became painful to walk. He also noticed increase redness present and became concerned. He presents to the emergency apartment today for additional evaluation of this problem. The patient denies any chills. He's had some mild clear drainage from the medial aspect of the right ankle, but states this is not uncommon with his edema. He has not taken any medications for this problem.  The history is provided by the patient.    Past Medical History  Diagnosis Date  . CHF (congestive heart failure)   . Hypertension   . Obesity   . Hyperlipidemia   . PUD (peptic ulcer disease)   . GERD (gastroesophageal reflux disease)   . S/P endoscopy 2007, 2012    2007: nl, May 2012: antral erosions  . Osteoarthritis   . S/P colonoscopy 2007, Feb and March 2012    hx of adenomas, left-sided diverticula, On 07/2010 TCS, fresh blood and clot noted coming from TI.  Marland Kitchen Anxiety   . Thyroid disease     hypothyroid  . Ringing in ears   . History of kidney stones   . History of bladder infections   . Heel spur     right heel  . Collagen vascular disease   . Thrombocytopenia 12/30/2011    Stable  . BPH (benign prostatic hyperplasia)     Past Surgical History  Procedure Date  . Knee arthroscopy   . Rotator cuff repair   . Cholecystectomy   . Lumbar spine surgery   .  Small bowel capsule study 07/2011    ?transient focal ischemia in distal small bowel to explain GI bleeding  . Esophagogastroduodenoscopy Aug 2013    [facility], single 5mm pedunculated polyp otherwise normal. HYPERPLASTIC, NEGATIVE h.pylori    Family History  Problem Relation Age of Onset  . Colon cancer Father 76    deceased  . Prostate cancer Father   . Pancreatic cancer Mother 63    deceased    History  Substance Use Topics  . Smoking status: Former Smoker    Types: Cigars    Quit date: 08/10/1973  . Smokeless tobacco: Never Used  . Alcohol Use: No      Review of Systems  Constitutional: Negative for activity change.       All ROS Neg except as noted in HPI  HENT: Negative for nosebleeds and neck pain.   Eyes: Negative for photophobia and discharge.  Respiratory: Positive for shortness of breath. Negative for cough and wheezing.   Cardiovascular: Negative for chest pain and palpitations.  Gastrointestinal: Negative for abdominal pain and blood in stool.  Genitourinary: Positive for dysuria. Negative for frequency and hematuria.  Musculoskeletal: Positive for arthralgias. Negative for back pain.  Skin: Negative.   Neurological: Negative for dizziness, seizures and speech difficulty.  Psychiatric/Behavioral: Negative for hallucinations and confusion. The patient is nervous/anxious.  Allergies  Aspirin  Home Medications   Current Outpatient Rx  Name  Route  Sig  Dispense  Refill  . ACETAMINOPHEN 325 MG PO TABS   Oral   Take 650 mg by mouth every 6 (six) hours as needed. Pain         . ATORVASTATIN CALCIUM 80 MG PO TABS   Oral   Take 80 mg by mouth at bedtime.         . DORZOLAMIDE HCL-TIMOLOL MAL PF 22.3-6.8 MG/ML OP SOLN   Ophthalmic   Apply 1 drop to eye 2 (two) times daily. Dorzolamide HCI-Timolol Maleate Ophthalmic Solution 2.23%/0.68%         . FUROSEMIDE 80 MG PO TABS   Oral   Take 80 mg by mouth 2 (two) times daily.          Marland Kitchen  HYDROCODONE-ACETAMINOPHEN 10-325 MG PO TABS   Oral   Take 1 tablet by mouth every 6 (six) hours as needed. For Pain. *Do not exceed more than 4 tablets per day*         . LATANOPROST 0.005 % OP SOLN   Both Eyes   Place 1 drop into both eyes at bedtime.         Marland Kitchen METOPROLOL SUCCINATE ER 25 MG PO TB24   Oral   Take 12.5 mg by mouth every morning.          Marland Kitchen ONDANSETRON HCL 4 MG PO TABS   Oral   Take 1 tablet (4 mg total) by mouth every 8 (eight) hours as needed. For nausea   30 tablet   3   . OVER THE COUNTER MEDICATION   Oral   Take 1 tablet by mouth 2 (two) times daily. OTC Acid Reflux Medication.         Marland Kitchen OXYMETAZOLINE HCL 0.05 % NA SOLN   Nasal   Place 2 sprays into the nose daily as needed. Congestion         . RESTORA PO CAPS   Oral   Take 1 capsule by mouth every morning.          Marland Kitchen TAMSULOSIN HCL 0.4 MG PO CAPS   Oral   Take 0.4 mg by mouth at bedtime.          . TROLAMINE SALICYLATE 10 % EX CREA   Topical   Apply 1 application topically as needed. *Applied to knee(s)*           BP 165/81  Pulse 100  Temp 98 F (36.7 C)  Resp 18  Ht 5\' 9"  (1.753 m)  Wt 283 lb (128.368 kg)  BMI 41.79 kg/m2  SpO2 90%  Physical Exam  Nursing note and vitals reviewed. Constitutional: He is oriented to person, place, and time. He appears well-developed and well-nourished.  Non-toxic appearance.  HENT:  Head: Normocephalic.  Right Ear: Tympanic membrane and external ear normal.  Left Ear: Tympanic membrane and external ear normal.  Eyes: EOM and lids are normal. Pupils are equal, round, and reactive to light.  Neck: Normal range of motion. Neck supple. Carotid bruit is not present.  Cardiovascular: Regular rhythm, normal heart sounds, intact distal pulses and normal pulses.  Tachycardia present.   Pulmonary/Chest: Breath sounds normal. No respiratory distress.  Abdominal: Soft. Bowel sounds are normal. There is no tenderness. There is no guarding.    Musculoskeletal: Normal range of motion.       There is increased redness and swelling from the ankle  to the midcalf of the right lower extremity. The dorsalis pedis is one plus. There is some swelling of the dorsum of the foot as well as the toes. Is no pain or swelling of the knee. There is good range of motion of the right hip.  Lymphadenopathy:       Head (right side): No submandibular adenopathy present.       Head (left side): No submandibular adenopathy present.    He has no cervical adenopathy.  Neurological: He is alert and oriented to person, place, and time. He has normal strength. No cranial nerve deficit or sensory deficit.  Skin: Skin is warm and dry.  Psychiatric: He has a normal mood and affect. His speech is normal.    ED Course  Procedures (including critical care time)   Labs Reviewed  CBC WITH DIFFERENTIAL  BASIC METABOLIC PANEL  URINALYSIS, ROUTINE W REFLEX MICROSCOPIC   Dg Ankle Complete Right  06/14/2012  *RADIOLOGY REPORT*  Clinical Data: Ankle pain  RIGHT ANKLE - COMPLETE 3+ VIEW  Comparison: None.  Findings: Three views of the right ankle submitted.  No acute fracture or subluxation.  Diffuse soft tissue swelling is noted. Ankle mortise is preserved.  Small plantar and posterior spur of the calcaneus.  Dystrophic calcification noted in the plantar region.  IMPRESSION: No acute fracture or subluxation.  Plantar and posterior spurring of the calcaneus.  Diffuse soft tissue swelling.   Original Report Authenticated By: Natasha Mead, M.D.      No diagnosis found.    MDM  I have reviewed nursing notes, vital signs, and all appropriate lab and imaging results for this patient. Patient sustained an injury to the right ankle as a piece of wood hit the ankle 3 days ago. The patient has been having increasing swelling and redness and now has some pain with walking. He is not had any high fever or chills related to. His been no previous operations or procedures involving  the right ankle and lower leg.   X-ray of the right ankle is negative for fracture but shows diffuse soft tissue swelling. The complete blood count shows an elevation in the white blood cell count of 11,000, the hemoglobin and hematocrit are well within normal limits. The platelet count is 94,000 which is low. The urine analysis is within normal limits. The basic metabolic panel shows the electrolytes to be within normal limits the glucose was slightly elevated at 118 the BUN and creatinine are normal. The patient was examined by Dr. Estell Harpin. I have discussed the case with the [facility] hospitalist, and they will consider the patient for admission. Vancomycin has been started for this patient.       Kathie Dike, [place] 06/14/12 1739

## 2012-06-14 NOTE — ED Notes (Signed)
Pt c/o right ankle pain/brusing/swelling since hitting it on a piece of wood x 3 days ago.

## 2012-06-14 NOTE — H&P (Signed)
[facility] History and Physical  [name] [name] AOZ:[id number] DOB: Sep 22, 1940 DOA: 06/14/2012  Referring physician: Illene Labrador, ED PA-C PCP: Cassell Smiles., MD  Specialists:   Chief Complaint: right lower extremity pain and redness  HPI: [name] [name] is a 72 y.o. male past medical history of BPH, collagen vascular disease, kidney stones, bladder infections, thyroid disease and anxiety. Lately he has been taking Lasix to try to reduce the swelling in his lower extremities bilaterally. They were improving he states he could almost put on his compression hose.  3 days ago he was outside chopping wood when a large splinter of wood struck the medial side of his ankle. That night the ankle became red and swollen, the following day an area of redness turned blue, and this morning he noticed the toes of his swollen right foot were numb.  He reports that urinating makes the pain in his right leg worse. He denies any fever or chills. He denies any vomiting diarrhea or recent illness.  Over the past year he has had 2 other significant wounds in his right lower extremity. Both of these required professional wound care to heal.  He has been working with Dr. Jerre Simon, and was scheduled for cystoscopy today for difficulty urinating. He mentions that several days ago his urine was dark in color and had a strong odor. Today it looks normal.  Finally he complains of a severe headache above his eyes yesterday, cramping in the fifth digits of his hands bilaterally, as well as new unusual bruising on his left forearm.  Review of Systems:  He endorses decreased appetite, and early satiety.  He denies vomiting, changes in bowel habits, weight loss, chest pain, abdominal pain, dizziness. All other systems were reviewed and found to be negative  Past Medical History  Diagnosis Date  . CHF (congestive heart failure)   . Hypertension   . Obesity   . Hyperlipidemia   . PUD (peptic ulcer disease)   .  GERD (gastroesophageal reflux disease)   . S/P endoscopy 2007, 2012    2007: nl, May 2012: antral erosions  . Osteoarthritis   . S/P colonoscopy 2007, Feb and March 2012    hx of adenomas, left-sided diverticula, On 07/2010 TCS, fresh blood and clot noted coming from TI.  Marland Kitchen Anxiety   . Thyroid disease     hypothyroid  . Ringing in ears   . History of kidney stones   . History of bladder infections   . Heel spur     right heel  . Collagen vascular disease   . Thrombocytopenia 12/30/2011    Stable  . BPH (benign prostatic hyperplasia)    Past Surgical History  Procedure Date  . Knee arthroscopy   . Rotator cuff repair   . Cholecystectomy   . Lumbar spine surgery   . Small bowel capsule study 07/2011    ?transient focal ischemia in distal small bowel to explain GI bleeding  . Esophagogastroduodenoscopy Aug 2013    [facility], single 5mm pedunculated polyp otherwise normal. HYPERPLASTIC, NEGATIVE h.pylori   Social History:  reports that he quit smoking about 38 years ago. His smoking use included Cigars. He has never used smokeless tobacco. He reports that he does not drink alcohol or use illicit drugs. He lives at home with his wife, and is independent with his ADLs.  Allergies  Allergen Reactions  . Aspirin Other (See Comments)    G.I. Bleed     Family History  Problem Relation Age of  Onset  . Colon cancer Father 10    deceased  . Prostate cancer Father   . Pancreatic cancer Mother 70    deceased    Prior to Admission medications   Medication Sig Start Date End Date Taking? Authorizing Provider  acetaminophen (TYLENOL) 325 MG tablet Take 650 mg by mouth every 6 (six) hours as needed. Pain   Yes Historical Provider, MD  atorvastatin (LIPITOR) 80 MG tablet Take 80 mg by mouth at bedtime.   Yes Historical Provider, MD  Dorzolamide HCl-Timolol Mal PF 22.3-6.8 MG/ML SOLN Apply 1 drop to eye 2 (two) times daily. Dorzolamide HCI-Timolol Maleate Ophthalmic Solution 2.23%/0.68%   Yes  Historical Provider, MD  furosemide (LASIX) 80 MG tablet Take 80 mg by mouth 2 (two) times daily.    Yes Historical Provider, MD  HYDROcodone-acetaminophen (NORCO) 10-325 MG per tablet Take 1 tablet by mouth every 6 (six) hours as needed. For Pain. *Do not exceed more than 4 tablets per day* 10/31/11  Yes Historical Provider, MD  latanoprost (XALATAN) 0.005 % ophthalmic solution Place 1 drop into both eyes at bedtime.   Yes Historical Provider, MD  metoprolol succinate (TOPROL-XL) 25 MG 24 hr tablet Take 12.5 mg by mouth every morning.    Yes Historical Provider, MD  ondansetron (ZOFRAN) 4 MG tablet Take 1 tablet (4 mg total) by mouth every 8 (eight) hours as needed. For nausea 03/14/12  Yes Nira Retort, NP  OVER THE COUNTER MEDICATION Take 1 tablet by mouth 2 (two) times daily. OTC Acid Reflux Medication.   Yes Historical Provider, MD  oxymetazoline (AFRIN) 0.05 % nasal spray Place 2 sprays into the nose daily as needed. Congestion   Yes Historical Provider, MD  Probiotic Product (RESTORA) CAPS Take 1 capsule by mouth every morning.    Yes Historical Provider, MD  Tamsulosin HCl (FLOMAX) 0.4 MG CAPS Take 0.4 mg by mouth at bedtime.    Yes Historical Provider, MD  trolamine salicylate (ASPERCREME) 10 % cream Apply 1 application topically as needed. *Applied to knee(s)*   Yes Historical Provider, MD   Physical Exam: Filed Vitals:   06/14/12 0931 06/14/12 0934 06/14/12 1406  BP: 165/81  132/69  Pulse: 100  81  Temp: 98 F (36.7 C)    Resp: 18  22  Height: 5\' 9"  (1.753 m)    Weight: 128.368 kg (283 lb)    SpO2:  90% 90%     General:  Well-developed, African American male, pleasant-appearing, lying comfortably in the ED.  Eyes: sclera are clear conjunctiva are pink, pupils are equal round  ENT: MUCOUS membranes are moist, oropharynx is pink without exudate  Neck: supple without lymphadenopathy  Cardiovascular: regular rate and rhythm without murmur rub or gallop  Respiratory: clear to  auscultation without wheezes crackles arousals  Abdomen: protuberant, nontender, unable to appreciate masses due to body habitus. Positive bowel sounds  Skin: right lower extremity is dark red red and edematous from the mid foot to just above the mid tibial area.  He has an area of blue discoloration near his distal tibia on the medial side. He has 2 areas that appear blistered on the leg.  Palpable pedal pulses  Musculoskeletal: he is able to move all 4 extremities, and has symmetric strength in each.  Psychiatric: alert and oriented, appropriate, cooperative, pleasant  Neurologic: toes and plantar portion of the foot have decreased sensation on the right in comparison to the left. Otherwise exam is nonfocal.  Labs on Admission:  Basic Metabolic Panel:  Lab 06/14/12 1610  NA 140  K 3.7  CL 102  CO2 31  GLUCOSE 118*  BUN 13  CREATININE 1.00  CALCIUM 9.4  MG --  PHOS --   CBC:  Lab 06/14/12 1110  WBC 11.0*  NEUTROABS 9.4*  HGB 13.6  HCT 41.2  MCV 81.6  PLT 94*    BNP (last 3 results)  Basename 12/26/11 1609  PROBNP 31.8    Radiological Exams on Admission: Dg Ankle Complete Right  06/14/2012  *RADIOLOGY REPORT*  Clinical Data: Ankle pain  RIGHT ANKLE - COMPLETE 3+ VIEW  Comparison: None.  Findings: Three views of the right ankle submitted.  No acute fracture or subluxation.  Diffuse soft tissue swelling is noted. Ankle mortise is preserved.  Small plantar and posterior spur of the calcaneus.  Dystrophic calcification noted in the plantar region.  IMPRESSION: No acute fracture or subluxation.  Plantar and posterior spurring of the calcaneus.  Diffuse soft tissue swelling.   Original Report Authenticated By: Natasha Mead, M.D.     Assessment/Plan Active Problems:  Cellulitis  HYPERTENSION  BPH (benign prostatic hyperplasia)  Edema   Cellulitis of the right lower extremity Appears to be a significant infection admit to inpatient Check Doppler ultrasound to rule out  DVT Draw blood cultures x2 Start Vanc and Zosyn per pharmacy Will elevate legs above the level of the heart  Bilateral lower extremity edema Change Lasix to IV and continue diuresis  BPH with dysuria Continue Flomax and Lasix Have alerted Dr. Jerre Simon that the patient is in house Dr. Jerre Simon will determine if the patient should do forward with cystoscopy while in house.  Hypertension Moderately controlled Will continue metoprolol succinate and Lasix   Code Status: Full code Family Communication:  Disposition Plan: To be determined. Currently inpatient. Hopefully home by Saturday or Sunday.  Time spent: 60 minutes  Conley Canal [facility] Pager [phone]  If 7PM-7AM, please contact night-coverage [email or web address] Password [facility] 06/14/2012, 2:50 PM  Patient interviewed and examined. Discussed plan with Ms. Elyn Peers. Agree with above. Crista Curb, M.D.

## 2012-06-14 NOTE — ED Notes (Signed)
Lab at bedside

## 2012-06-14 NOTE — ED Notes (Signed)
Attempted to call report

## 2012-06-14 NOTE — ED Notes (Signed)
Patient given a gingerale

## 2012-06-14 NOTE — ED Notes (Signed)
Please note that BC's ordered after IV antibiotics given

## 2012-06-14 NOTE — Consult Note (Signed)
Note [phone]

## 2012-06-14 NOTE — Progress Notes (Signed)
ANTIBIOTIC CONSULT NOTE - INITIAL  Pharmacy Consult for Vancomycin and Zosyn Indication: cellulitis  Allergies  Allergen Reactions  . Aspirin Other (See Comments)    G.I. Bleed    Patient Measurements: Height: 5\' 9"  (175.3 cm) Weight: 283 lb (128.368 kg) IBW/kg (Calculated) : 70.7   Vital Signs: Temp: 98 F (36.7 C) (01/23 0931) BP: 132/69 mmHg (01/23 1406) Pulse Rate: 81  (01/23 1406) Intake/Output from previous day:   Intake/Output from this shift: Total I/O In: -  Out: 500 [Urine:500]  Labs:  [facility] 06/14/12 1110  WBC 11.0*  HGB 13.6  PLT 94*  LABCREA --  CREATININE 1.00   Estimated Creatinine Clearance: 88.6 ml/min (by C-G formula based on Cr of 1). No results found for this basename: VANCOTROUGH:2,VANCOPEAK:2,VANCORANDOM:2,GENTTROUGH:2,GENTPEAK:2,GENTRANDOM:2,TOBRATROUGH:2,TOBRAPEAK:2,TOBRARND:2,AMIKACINPEAK:2,AMIKACINTROU:2,AMIKACIN:2, in the last 72 hours   Microbiology: No results found for this or any previous visit (from the past 720 hour(s)).  Medical History: Past Medical History  Diagnosis Date  . CHF (congestive heart failure)   . Hypertension   . Obesity   . Hyperlipidemia   . PUD (peptic ulcer disease)   . GERD (gastroesophageal reflux disease)   . S/P endoscopy 2007, 2012    2007: nl, May 2012: antral erosions  . Osteoarthritis   . S/P colonoscopy 2007, Feb and March 2012    hx of adenomas, left-sided diverticula, On 07/2010 TCS, fresh blood and clot noted coming from TI.  Marland Kitchen Anxiety   . Thyroid disease     hypothyroid  . Ringing in ears   . History of kidney stones   . History of bladder infections   . Heel spur     right heel  . Collagen vascular disease   . Thrombocytopenia 12/30/2011    Stable  . BPH (benign prostatic hyperplasia)    Medications:  Scheduled:    . [COMPLETED] sodium chloride   Intravenous Once  . sodium chloride   Intravenous STAT  . acidophilus  1 capsule Oral q morning - 10a  . atorvastatin  80 mg Oral QHS   . docusate sodium  100 mg Oral BID  . Dorzolamide HCl-Timolol Mal PF  1 drop Ophthalmic BID  . enoxaparin (LOVENOX) injection  60 mg Subcutaneous Q24H  . furosemide  80 mg Intravenous BID  . [COMPLETED] HYDROcodone-acetaminophen  2 tablet Oral Once  . latanoprost  1 drop Both Eyes QHS  . metoprolol succinate  12.5 mg Oral q morning - 10a  . [COMPLETED] ondansetron  4 mg Oral Once  . piperacillin-tazobactam (ZOSYN)  IV  3.375 g Intravenous Q8H  . sodium chloride  3 mL Intravenous Q12H  . Tamsulosin HCl  0.4 mg Oral QHS  . [COMPLETED] vancomycin  1,000 mg Intravenous Once  . vancomycin  1,000 mg Intravenous Q8H  . [DISCONTINUED] Dorzolamide HCl-Timolol Mal PF  1 drop Ophthalmic BID   Assessment: 71yo morbidly obese male with good renal fxn admitted with LE cellulitis.  Estimated Creatinine Clearance: 88.6 ml/min (by C-G formula based on Cr of 1).  Pt received Vancomycin 1gm in ED on admission.  Goal of Therapy:  Vancomycin trough level 10-15 mcg/ml  Plan: Zosyn 3.375gm IV q8hrs (4hr infusion) Vancomycin 1gm IV q8hrs Check trough at steady state Monitor labs, renal fxn, and cultures per protocol.  Valrie Hart A 06/14/2012,4:20 PM

## 2012-06-15 ENCOUNTER — Inpatient Hospital Stay (HOSPITAL_COMMUNITY): Payer: Medicare Other

## 2012-06-15 DIAGNOSIS — R609 Edema, unspecified: Secondary | ICD-10-CM | POA: Diagnosis not present

## 2012-06-15 DIAGNOSIS — L03119 Cellulitis of unspecified part of limb: Secondary | ICD-10-CM | POA: Diagnosis not present

## 2012-06-15 DIAGNOSIS — K921 Melena: Secondary | ICD-10-CM | POA: Diagnosis not present

## 2012-06-15 DIAGNOSIS — N400 Enlarged prostate without lower urinary tract symptoms: Secondary | ICD-10-CM | POA: Diagnosis not present

## 2012-06-15 DIAGNOSIS — K5900 Constipation, unspecified: Secondary | ICD-10-CM

## 2012-06-15 DIAGNOSIS — K625 Hemorrhage of anus and rectum: Secondary | ICD-10-CM | POA: Diagnosis not present

## 2012-06-15 DIAGNOSIS — K6389 Other specified diseases of intestine: Secondary | ICD-10-CM | POA: Diagnosis not present

## 2012-06-15 DIAGNOSIS — E669 Obesity, unspecified: Secondary | ICD-10-CM

## 2012-06-15 DIAGNOSIS — R110 Nausea: Secondary | ICD-10-CM

## 2012-06-15 LAB — BASIC METABOLIC PANEL WITH GFR
BUN: 10 mg/dL (ref 6–23)
CO2: 33 meq/L — ABNORMAL HIGH (ref 19–32)
Calcium: 8.9 mg/dL (ref 8.4–10.5)
Chloride: 97 meq/L (ref 96–112)
Creatinine, Ser: 1.02 mg/dL (ref 0.50–1.35)
GFR calc Af Amer: 83 mL/min — ABNORMAL LOW
GFR calc non Af Amer: 71 mL/min — ABNORMAL LOW
Glucose, Bld: 132 mg/dL — ABNORMAL HIGH (ref 70–99)
Potassium: 3.4 meq/L — ABNORMAL LOW (ref 3.5–5.1)
Sodium: 137 meq/L (ref 135–145)

## 2012-06-15 LAB — CBC
HCT: 39.9 % (ref 39.0–52.0)
Hemoglobin: 13 g/dL (ref 13.0–17.0)
MCV: 82.6 fL (ref 78.0–100.0)
RBC: 4.83 MIL/uL (ref 4.22–5.81)
WBC: 12.1 10*3/uL — ABNORMAL HIGH (ref 4.0–10.5)

## 2012-06-15 MED ORDER — POTASSIUM CHLORIDE IN NACL 40-0.9 MEQ/L-% IV SOLN
INTRAVENOUS | Status: DC
  Administered 2012-06-15: 10:00:00 via INTRAVENOUS

## 2012-06-15 MED ORDER — POTASSIUM CHLORIDE CRYS ER 20 MEQ PO TBCR
20.0000 meq | EXTENDED_RELEASE_TABLET | Freq: Two times a day (BID) | ORAL | Status: DC
  Administered 2012-06-15 – 2012-06-17 (×5): 20 meq via ORAL
  Filled 2012-06-15 (×5): qty 1

## 2012-06-15 MED ORDER — FUROSEMIDE 80 MG PO TABS
80.0000 mg | ORAL_TABLET | Freq: Two times a day (BID) | ORAL | Status: DC
  Administered 2012-06-15 – 2012-06-17 (×4): 80 mg via ORAL
  Filled 2012-06-15 (×4): qty 1

## 2012-06-15 MED ORDER — ONDANSETRON HCL 4 MG/2ML IJ SOLN
4.0000 mg | Freq: Four times a day (QID) | INTRAMUSCULAR | Status: DC | PRN
  Administered 2012-06-15 – 2012-06-17 (×3): 4 mg via INTRAVENOUS
  Filled 2012-06-15 (×3): qty 2

## 2012-06-15 MED ORDER — SENNA 8.6 MG PO TABS
2.0000 | ORAL_TABLET | Freq: Every day | ORAL | Status: DC
  Administered 2012-06-15 – 2012-06-17 (×3): 17.2 mg via ORAL
  Filled 2012-06-15 (×3): qty 2

## 2012-06-15 MED ORDER — POTASSIUM CHLORIDE CRYS ER 20 MEQ PO TBCR
40.0000 meq | EXTENDED_RELEASE_TABLET | Freq: Once | ORAL | Status: AC
  Administered 2012-06-15: 40 meq via ORAL
  Filled 2012-06-15: qty 2

## 2012-06-15 MED ORDER — SIMETHICONE 40 MG/0.6ML PO SUSP: 40.0000 mg | Freq: Four times a day (QID) | ORAL | Status: DC | PRN

## 2012-06-15 MED ORDER — SODIUM CHLORIDE 0.9 % IV SOLN
INTRAVENOUS | Status: DC
  Filled 2012-06-15: qty 1000

## 2012-06-15 MED ORDER — ONDANSETRON HCL 4 MG PO TABS: 4.0000 mg | ORAL_TABLET | Freq: Four times a day (QID) | ORAL | Status: DC | PRN

## 2012-06-15 NOTE — Progress Notes (Signed)
UR Chart Review Completed  

## 2012-06-15 NOTE — Consult Note (Signed)
NAME:  [name] [name]             ACCOUNT NO.:  [id number]  MEDICAL RECORD NO.:  [id number]  LOCATION:  A309                          FACILITY:  [facility]  PHYSICIAN:  Ky Barban, M.D.DATE OF BIRTH:  03/31/1941  DATE OF CONSULTATION: DATE OF DISCHARGE:                                CONSULTATION   CHIEF COMPLAINT:  Symptoms of prostatism.  HISTORY OF PRESENT ILLNESS:  A 72 year old gentleman who is here in the hospital with cellulitis of his right leg.  He is having congestive heart failure, but he is having no breathing problem.  He has edema of his lower extremities.  I was asked to see him because he has symptoms of prostatism.  He told me he gets up 3-4 times at night.  Urinary stream is slow.  It starts and stops.  He already has an appointment to see me in the office, so I seen him because he was here in the hospital. He has no gross hematuria and these symptoms are going on for several years, getting worse slowly.  He does not know why he has congestive heart failure.  No history of coronary artery disease.  PHYSICAL EXAMINATION:  GENERAL:  Moderately obese male, not in acute distress, fully conscious, alert, oriented. VITAL SIGNS:  His pulse is 81 per minute, blood pressure 152/77, temperature 98.1.  ABDOMEN:  Obese.  Liver, spleen, kidneys are not palpable. 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  It was 2+ pitting edema.  He has cellulitis of right lower leg.  His testicles are normal. RECTAL:  Rectal sphincter tone is normal.  Prostate is 1.5+ smooth and firm.  IMPRESSION:  Rule out benign prostatic hypertrophy, will need cysto CMG. He will also need a renal ultrasound, which I think he can have it done here while he is here.  We will follow him.  We will see him in the office.     Ky Barban, M.D.     MIJ/MEDQ  D:  06/14/2012  T:  06/15/2012  Job:  [id number]

## 2012-06-15 NOTE — Care Management Note (Unsigned)
    Page 1 of 1   06/15/2012     11:59:37 AM   CARE MANAGEMENT NOTE 06/15/2012  Patient:  [name] [name], [name] [name]   Account Number:  [id number]  Date Initiated:  06/15/2012  Documentation initiated by:  Rosemary Holms  Subjective/Objective Assessment:   Pt admitted from home where he lives with spouse. Sister currently at bedside. Pt states he had [facility] previously and would want them if [facility] is ordered.     Action/Plan:   Anticipated DC Date:  06/17/2012   Anticipated DC Plan:  HOME W HOME HEALTH SERVICES      DC Planning Services  CM consult      Choice offered to / List presented to:             Status of service:  In process, will continue to follow Medicare Important Message given?   (If response is "NO", the following Medicare IM given date fields will be blank) Date Medicare IM given:   Date Additional Medicare IM given:    Discharge Disposition:    Per UR Regulation:    If discussed at Long Length of Stay Meetings, dates discussed:    Comments:  06/15/12 Rosemary Holms RN BSN CM Medication assistance not an option since pt has Medicare Part D and Medicare suppliment.

## 2012-06-15 NOTE — Progress Notes (Addendum)
[facility] PROGRESS NOTE  [name] [name] WUJ:[id number] DOB: March 03, 1941 DOA: 06/14/2012 PCP: [name] [name]., MD  Assessment/Plan:  This morning [name] [name] complains of bright red blood per rectum, nausea, left lower quadrant abdominal pain, inability to eat. He states [name] [name] performed a prostate exam last evening after which the patient had bright red blood per rectum. This morning he has increasing nausea bloating and abdominal pain. No stool. Small amount of blood on the bed.  Right lower extremity cellulitis Appear much improved with less erythema and edema I will ask the nurse to trace the erythema Doppler ultrasound of right lower extremity is pending continue vancomycin and Zosyn, elevation, diuretics  Bilateral lower extremity edema Received IV Lasix last night and this morning We'll change back to by mouth Lasix at PTA dose 80 mg twice a day  Prostatism [name] [name] is kindly consulting Urinalysis is clean [name] [name] has requested a renal ultrasound which we will order The patient will undergo cystoscopy at some point (inpatient versus outpatient)  Abdominal pain and bloating in association with tympanic bowel sounds This is a chronic problem that [name] [name] has been addressing with the patient Will check a two-view abdominal x-ray to rule out ileus Maintain serum potassium between 4 and 5 Ambulate patient  Bright red blood per rectum, one episode after prostate exam. This is likely hemorrhoidal in nature Hemoglobin stable. Hold lovenox for now. SCDs No thrombosed hemorrhoids on exam Will monitor H&H.  Consult GI if this continues  Hypertension Appears well controlled on metoprolol and Lasix  Chronic Thrombocytopenia  Code Status: full Family Communication: sister at bedside Disposition Plan:  Home when appropriate   Consultants:  Urology  Procedures:    Antibiotics: Vancomycin and Zosyn for right lower extremity cellulitis started 06/14/2012  and 6   HPI/Subjective: Complaining of nausea, inability to eat breakfast.  Objective: Filed Vitals:   06/14/12 1406 06/14/12 1626 06/14/12 2128 06/15/12 0539  BP: 132/69 152/77 127/83 128/66  Pulse: 81 81 80 79  Temp:  98.1 F (36.7 C) 97.8 F (36.6 C) 97.8 F (36.6 C)  TempSrc:   Oral Oral  Resp: 22 20 18 16   Height:  5\' 9"  (1.753 m)    Weight:  126.3 kg (278 lb 7.1 oz)  127 kg (279 lb 15.8 oz)  SpO2: 90% 91% 91% 93%    Intake/Output Summary (Last 24 hours) at 06/15/12 1035 Last data filed at 06/15/12 0953  Gross per 24 hour  Intake    625 ml  Output   3450 ml  Net  -2825 ml   Filed Weights   06/14/12 0931 06/14/12 1626 06/15/12 0539  Weight: 128.368 kg (283 lb) 126.3 kg (278 lb 7.1 oz) 127 kg (279 lb 15.8 oz)    Exam:   General:  alert and oriented, no apparent distress, lying comfortably in bed  Cardiovascular: regular rate and rhythm, no murmurs rubs or gallops  Respiratory: clear to auscultation, no accessory muscle use  Abdomen: protuberant, tender to palpation in LLQ, tympanic bowel sounds.   Rectal external nonthrombosed hemorrhoids noted with small amount of bright red blood.  lower extremities: 2+ pitting edema bilaterally, erythematous slightly decreased in right lower extremity, still with significant dark red cellulitis  Data Reviewed: Basic Metabolic Panel:  Lab 06/15/12 9562 06/14/12 1110  NA 137 140  K 3.4* 3.7  CL 97 102  CO2 33* 31  GLUCOSE 132* 118*  BUN 10 13  CREATININE 1.02 1.00  CALCIUM 8.9 9.4  MG --  2.0  PHOS -- --  CBC:  Lab 06/15/12 0401 06/14/12 1110  WBC 12.1* 11.0*  NEUTROABS -- 9.4*  HGB 13.0 13.6  HCT 39.9 41.2  MCV 82.6 81.6  PLT 89* 94*   BNP (last 3 results)  Basename 12/26/11 1609  PROBNP 31.8   CBG: No results found for this basename: GLUCAP:5 in the last 168 hours  Recent Results (from the past 240 hour(s))  CULTURE, BLOOD (ROUTINE X 2)     Status: Normal (Preliminary result)   Collection Time    06/14/12 11:10 AM      Component Value Range Status Comment   Specimen Description BLOOD RIGHT ANTECUBITAL   Final    Special Requests BOTTLES DRAWN AEROBIC ONLY 10CC  IMMUNE:COMPRM   Final    Culture NO GROWTH 1 DAY   Final    Report Status PENDING   Incomplete   CULTURE, BLOOD (ROUTINE X 2)     Status: Normal (Preliminary result)   Collection Time   06/14/12  2:07 PM      Component Value Range Status Comment   Specimen Description BLOOD LEFT ANTECUBITAL   Final    Special Requests     Final    Value: BOTTLES DRAWN AEROBIC AND ANAEROBIC AEB=15CC ANA=12CC    Culture NO GROWTH 1 DAY   Final    Report Status PENDING   Incomplete      Studies: Dg Ankle Complete Right  06/14/2012  *RADIOLOGY REPORT*  Clinical Data: Ankle pain  RIGHT ANKLE - COMPLETE 3+ VIEW  Comparison: None.  Findings: Three views of the right ankle submitted.  No acute fracture or subluxation.  Diffuse soft tissue swelling is noted. Ankle mortise is preserved.  Small plantar and posterior spur of the calcaneus.  Dystrophic calcification noted in the plantar region.  IMPRESSION: No acute fracture or subluxation.  Plantar and posterior spurring of the calcaneus.  Diffuse soft tissue swelling.   Original Report Authenticated By: [name] [name], M.D.     Scheduled Meds:    . acidophilus  1 capsule Oral q morning - 10a  . atorvastatin  80 mg Oral QHS  . docusate sodium  100 mg Oral BID  . Dorzolamide HCl-Timolol Mal PF  1 drop Ophthalmic BID  . enoxaparin (LOVENOX) injection  60 mg Subcutaneous Q24H  . furosemide  80 mg Oral BID  . latanoprost  1 drop Both Eyes QHS  . metoprolol succinate  12.5 mg Oral q morning - 10a  . piperacillin-tazobactam (ZOSYN)  IV  3.375 g Intravenous Q8H  . sodium chloride  3 mL Intravenous Q12H  . Tamsulosin HCl  0.4 mg Oral QHS  . vancomycin  1,000 mg Intravenous Q8H   Continuous Infusions:   Active Problems:  Cellulitis  HYPERTENSION  ABDOMINAL BLOATING  NAUSEA  Rectal bleeding  BPH  (benign prostatic hyperplasia)  Edema  Bright red blood per rectum  Time spent: 40 minutes  [name] [name]  [facility] Pager [phone]. If 8PM-8AM, please contact night-coverage at [email or web address], password [facility] 06/15/2012, 10:35 AM  LOS: 1 day    Attending note; Patient interviewed and examined. Leg is much improved. Agree.  Xray shows mildly nonspecific T-colon gas. Already on Colace. Will add Senokot. Consider Proctofoam HC for bleeding hemorrhoids. Increase activity. Renal ultrasound shows bilateral cysts. No hydronephrosis. Await doppler. Replete potassium.  Updated family  Crista Curb, M.D.

## 2012-06-16 DIAGNOSIS — R110 Nausea: Secondary | ICD-10-CM | POA: Diagnosis not present

## 2012-06-16 DIAGNOSIS — R609 Edema, unspecified: Secondary | ICD-10-CM | POA: Diagnosis not present

## 2012-06-16 DIAGNOSIS — E669 Obesity, unspecified: Secondary | ICD-10-CM | POA: Diagnosis not present

## 2012-06-16 DIAGNOSIS — L03119 Cellulitis of unspecified part of limb: Secondary | ICD-10-CM | POA: Diagnosis not present

## 2012-06-16 LAB — BASIC METABOLIC PANEL
Calcium: 9.3 mg/dL (ref 8.4–10.5)
GFR calc non Af Amer: 80 mL/min — ABNORMAL LOW (ref >90)
Sodium: 136 mEq/L (ref 135–145)

## 2012-06-16 LAB — CBC
MCH: 27 pg (ref 26.0–34.0)
MCHC: 32.8 g/dL (ref 30.0–36.0)
Platelets: 90 10*3/uL — ABNORMAL LOW (ref 150–400)
RDW: 17.2 % — ABNORMAL HIGH (ref 11.5–15.5)

## 2012-06-16 MED ORDER — SIMETHICONE 80 MG PO CHEW
80.0000 mg | CHEWABLE_TABLET | Freq: Four times a day (QID) | ORAL | Status: DC | PRN
  Administered 2012-06-16 (×2): 80 mg via ORAL
  Filled 2012-06-16 (×2): qty 1

## 2012-06-16 NOTE — Progress Notes (Signed)
[facility] PROGRESS NOTE  [name] [name] ONG:[id number] DOB: 1940-11-05 DOA: 06/14/2012 PCP: Cassell Smiles., MD  Assessment/Plan:  Complaining of leg pain. Has frequent nausea every morning even prior to admission. ate lunch and supper last night. No further rectal bleeding. Has had a normal bowel movement. Has ambulated.  Right lower extremity cellulitis improving  Bilateral lower extremity edema, chronic  Prostatism outpatient cystoscopy per Dr. Jesse Fall.  Abdominal pain and bloating and chronic nausea. Supportive care. Increase activity. Continue bowel regimen.  Bright red blood per rectum, none further. Likely hemorrhoidal in nature. As Lovenox has been held, continue sequential compression of the alternate leg and frequent ambulation. Patient does not have his sequential compression device on currently and I've spoken with the nurse about this.  Hypertension  Chronic Thrombocytopenia  Obesity  Code Status: full Family Communication: sister at bedside Disposition Plan:  Home when appropriate   Consultants:  Urology  Procedures:    Antibiotics: Vancomycin and Zosyn for right lower extremity cellulitis started 06/14/2012 and 6   HPI/Subjective: Nauseated. Leg pain. No further bleeding.  Objective: Filed Vitals:   06/15/12 0539 06/15/12 1330 06/15/12 2048 06/16/12 0419  BP: 128/66 129/79 127/66 139/77  Pulse: 79 82 83 85  Temp: 97.8 F (36.6 C) 98 F (36.7 C) 98.1 F (36.7 C) 97.7 F (36.5 C)  TempSrc: Oral Oral Oral Oral  Resp: 16 18 18 18   Height:      Weight: 127 kg (279 lb 15.8 oz)   127.6 kg (281 lb 4.9 oz)  SpO2: 93% 93% 91% 94%    Intake/Output Summary (Last 24 hours) at 06/16/12 0923 Last data filed at 06/16/12 4401  Gross per 24 hour  Intake   1410 ml  Output   2676 ml  Net  -1266 ml   Filed Weights   06/14/12 1626 06/15/12 0539 06/16/12 0419  Weight: 126.3 kg (278 lb 7.1 oz) 127 kg (279 lb 15.8 oz) 127.6 kg (281 lb 4.9 oz)     Exam:   General:  alert and oriented, no apparent distress, lying comfortably in bed  Cardiovascular: regular rate and rhythm, no murmurs rubs or gallops  Respiratory: clear to auscultation, no accessory muscle use  Abdomen: protuberant, tender to palpation in LLQ, tympanic bowel sounds.   Rectal external nonthrombosed hemorrhoids noted with small amount of bright red blood.  Intense erythema and purpura receeding  Data Reviewed: Basic Metabolic Panel:  Lab 06/16/12 0272 06/15/12 0401 06/14/12 1110  NA 136 137 140  K 3.9 3.4* 3.7  CL 97 97 102  CO2 35* 33* 31  GLUCOSE 123* 132* 118*  BUN 10 10 13   CREATININE 0.99 1.02 1.00  CALCIUM 9.3 8.9 9.4  MG -- -- 2.0  PHOS -- -- --  CBC:  Lab 06/16/12 0306 06/15/12 0401 06/14/12 1110  WBC 10.3 12.1* 11.0*  NEUTROABS -- -- 9.4*  HGB 13.4 13.0 13.6  HCT 40.8 39.9 41.2  MCV 82.1 82.6 81.6  PLT 90* 89* 94*   BNP (last 3 results)  Basename 12/26/11 1609  PROBNP 31.8   CBG: No results found for this basename: GLUCAP:5 in the last 168 hours  Recent Results (from the past 240 hour(s))  CULTURE, BLOOD (ROUTINE X 2)     Status: Normal (Preliminary result)   Collection Time   06/14/12 11:10 AM      Component Value Range Status Comment   Specimen Description BLOOD RIGHT ANTECUBITAL   Final    Special Requests BOTTLES DRAWN AEROBIC ONLY  10CC  IMMUNE:COMPRM   Final    Culture NO GROWTH 1 DAY   Final    Report Status PENDING   Incomplete   CULTURE, BLOOD (ROUTINE X 2)     Status: Normal (Preliminary result)   Collection Time   06/14/12  2:07 PM      Component Value Range Status Comment   Specimen Description BLOOD LEFT ANTECUBITAL   Final    Special Requests     Final    Value: BOTTLES DRAWN AEROBIC AND ANAEROBIC AEB=15CC ANA=12CC    Culture NO GROWTH 1 DAY   Final    Report Status PENDING   Incomplete      Studies: Dg Ankle Complete Right  06/14/2012  *RADIOLOGY REPORT*  Clinical Data: Ankle pain  RIGHT ANKLE -  COMPLETE 3+ VIEW  Comparison: None.  Findings: Three views of the right ankle submitted.  No acute fracture or subluxation.  Diffuse soft tissue swelling is noted. Ankle mortise is preserved.  Small plantar and posterior spur of the calcaneus.  Dystrophic calcification noted in the plantar region.  IMPRESSION: No acute fracture or subluxation.  Plantar and posterior spurring of the calcaneus.  Diffuse soft tissue swelling.   Original Report Authenticated By: Natasha Mead, M.D.    US Renal  06/15/2012  *RADIOLOGY REPORT*  Clinical Data: Benign prostatic hyperplasia.  Congestive heart failure.  Renal insufficiency.  RENAL/URINARY TRACT ULTRASOUND COMPLETE  Comparison:  CT 02/27/2012.  Findings:  Right Kidney:  14.3 cm. No hydronephrosis.  An upper pole renal lesion measures 3.3 cm and is markedly hypoechoic.  Suggestion of enhanced through transmission.  A fluid density lesion on the prior CT measured similar and size, given cross modality comparison. No enhancement within this lesion on 07/24/2010.  Left Kidney:  14.7 cm. No hydronephrosis.  An interpolar left renal lesion measures 4.0 x 3.3 cm and is markedly hypoechoic to anechoic.  No vascularity. Appears simple on 07/24/2010 CT.  Bladder:  Within normal limits.  Exam degraded by patient body habitus.  IMPRESSION:  1. No hydronephrosis. 2.  Bilateral renal lesions, favored to represent cysts, especially given appearance on 07/24/2010 CT.   Original Report Authenticated By: Jeronimo Greaves, M.D.    US Venous Img Lower Bilateral  06/15/2012  *RADIOLOGY REPORT*  Clinical Data: Edema question DVT, history trauma, hypertension, collagen vascular disease  VENOUS DUPLEX ULTRASOUND OF BILATERAL LOWER EXTREMITIES  Technique:  Gray-scale sonography with graded compression, as well as color Doppler and duplex ultrasound, were performed to evaluate the deep venous system of both lower extremities from the level of the common femoral vein through the popliteal and proximal calf  veins.  Spectral Doppler was utilized to evaluate flow at rest and with distal augmentation maneuvers.  Comparison:  Left lower extremity venous ultrasound 10/02/2008  Findings: Deep venous systems appear patent and compressible from groin through popliteal fossa bilaterally.  Spontaneous venous flow present with intact augmentation and evidence of respiratory phasicity.  No intraluminal thrombus identified.  Visualized portions of the greater saphenous systems appear patent. Subcutaneous edema identified in right calf.  IMPRESSION: No evidence of deep venous thrombosis in either lower extremity.   Original Report Authenticated By: Ulyses Southward, M.D.    Dg Abd 2 Views  06/15/2012  *RADIOLOGY REPORT*  Clinical Data: Upper abdominal pain.  Blood in stool.  Dysuria.  ABDOMEN - 2 VIEW  Comparison: 02/27/2012 stone study.  Plain film of 12/26/2011.  Findings: Degradation secondary patient body habitus.  2 upright views.  No  gross free intraperitoneal air.  Limited evaluation for air fluid levels.  Supine views demonstrate gas filled colon, up to 9.5 cm.  Mildly dilated within the transverse segment.  No pneumatosis. The supine view is centered low.  Given this factor, no gross small bowel distention identified.  Probable rectal gas. Cholecystectomy clips.  IMPRESSION:  1.  Moderately degraded exam secondary patient body habitus. 2.  Nonspecific mild gaseous distention of the transverse colon, without small bowel distention or free intraperitoneal air identified.   Original Report Authenticated By: Jeronimo Greaves, M.D.     Scheduled Meds:    . acidophilus  1 capsule Oral q morning - 10a  . atorvastatin  80 mg Oral QHS  . docusate sodium  100 mg Oral BID  . Dorzolamide HCl-Timolol Mal PF  1 drop Ophthalmic BID  . furosemide  80 mg Oral BID  . latanoprost  1 drop Both Eyes QHS  . metoprolol succinate  12.5 mg Oral q morning - 10a  . piperacillin-tazobactam (ZOSYN)  IV  3.375 g Intravenous Q8H  . potassium  chloride  20 mEq Oral BID  . senna  2 tablet Oral Daily  . sodium chloride  3 mL Intravenous Q12H  . Tamsulosin HCl  0.4 mg Oral QHS  . vancomycin  1,000 mg Intravenous Q8H   Time spent: 20 min  [name] [name],

## 2012-06-16 NOTE — Progress Notes (Signed)
ANTIBIOTIC CONSULT NOTE  Pharmacy Consult for Vancomycin and Zosyn Indication: cellulitis  Allergies  Allergen Reactions  . Aspirin Other (See Comments)    G.I. Bleed    Patient Measurements: Height: 5\' 9"  (175.3 cm) Weight: 281 lb 4.9 oz (127.6 kg) IBW/kg (Calculated) : 70.7   Vital Signs: Temp: 97.7 F (36.5 C) (01/25 0419) Temp src: Oral (01/25 0419) BP: 139/77 mmHg (01/25 0419) Pulse Rate: 85  (01/25 0419) Intake/Output from previous day: 01/24 0701 - 01/25 0700 In: 1560 [P.O.:1060; IV Piggyback:500] Out: 2676 [Urine:2675; Stool:1] Intake/Output from this shift:    Labs:  Basename 06/16/12 0306 06/15/12 0401 06/14/12 1110  WBC 10.3 12.1* 11.0*  HGB 13.4 13.0 13.6  PLT 90* 89* 94*  LABCREA -- -- --  CREATININE 0.99 1.02 1.00   Estimated Creatinine Clearance: 89.2 ml/min (by C-G formula based on Cr of 0.99).  Basename 06/16/12 0306  VANCOTROUGH 16.6  VANCOPEAK --  VANCORANDOM --  GENTTROUGH --  GENTPEAK --  GENTRANDOM --  TOBRATROUGH --  TOBRAPEAK --  TOBRARND --  AMIKACINPEAK --  AMIKACINTROU --  AMIKACIN --     Microbiology: Recent Results (from the past 720 hour(s))  CULTURE, BLOOD (ROUTINE X 2)     Status: Normal (Preliminary result)   Collection Time   06/14/12 11:10 AM      Component Value Range Status Comment   Specimen Description BLOOD RIGHT ANTECUBITAL   Final    Special Requests BOTTLES DRAWN AEROBIC ONLY 10CC  IMMUNE:COMPRM   Final    Culture NO GROWTH 1 DAY   Final    Report Status PENDING   Incomplete   CULTURE, BLOOD (ROUTINE X 2)     Status: Normal (Preliminary result)   Collection Time   06/14/12  2:07 PM      Component Value Range Status Comment   Specimen Description BLOOD LEFT ANTECUBITAL   Final    Special Requests     Final    Value: BOTTLES DRAWN AEROBIC AND ANAEROBIC AEB=15CC ANA=12CC    Culture NO GROWTH 1 DAY   Final    Report Status PENDING   Incomplete     Medical History: Past Medical History  Diagnosis Date  .  CHF (congestive heart failure)   . Hypertension   . Obesity   . Hyperlipidemia   . PUD (peptic ulcer disease)   . GERD (gastroesophageal reflux disease)   . S/P endoscopy 2007, 2012    2007: nl, May 2012: antral erosions  . Osteoarthritis   . S/P colonoscopy 2007, Feb and March 2012    hx of adenomas, left-sided diverticula, On 07/2010 TCS, fresh blood and clot noted coming from TI.  Marland Kitchen Anxiety   . Thyroid disease     hypothyroid  . Ringing in ears   . History of kidney stones   . History of bladder infections   . Heel spur     right heel  . Collagen vascular disease   . Thrombocytopenia 12/30/2011    Stable  . BPH (benign prostatic hyperplasia)    Medications:  Scheduled:     . acidophilus  1 capsule Oral q morning - 10a  . atorvastatin  80 mg Oral QHS  . docusate sodium  100 mg Oral BID  . Dorzolamide HCl-Timolol Mal PF  1 drop Ophthalmic BID  . furosemide  80 mg Oral BID  . latanoprost  1 drop Both Eyes QHS  . metoprolol succinate  12.5 mg Oral q morning - 10a  .  piperacillin-tazobactam (ZOSYN)  IV  3.375 g Intravenous Q8H  . potassium chloride  20 mEq Oral BID  . [COMPLETED] potassium chloride  40 mEq Oral Once  . senna  2 tablet Oral Daily  . sodium chloride  3 mL Intravenous Q12H  . Tamsulosin HCl  0.4 mg Oral QHS  . vancomycin  1,000 mg Intravenous Q8H  . [DISCONTINUED] sodium chloride   Intravenous STAT  . [DISCONTINUED] enoxaparin (LOVENOX) injection  60 mg Subcutaneous Q24H  . [DISCONTINUED] furosemide  80 mg Intravenous BID   Assessment: Vancomycin trough 16.6, slightly above goal but within acceptable range. 72yo morbidly obese male with good renal fxn admitted with LE cellulitis.  Estimated Creatinine Clearance: 89.2 ml/min (by C-G formula based on Cr of 0.99).  Pt received Vancomycin 1gm in ED on admission.  Goal of Therapy:  Vancomycin trough level 10-15 mcg/ml  Plan: Continue Vancomycin 1 GM IV every 8 hours Zosyn 3.375gm IV q8hrs (4hr  infusion) Check trough weekly unless changes in renal function Monitor labs, renal fxn, and cultures per protocol.  [name] [name], [name] [name] [place] 06/16/2012,8:33 AM

## 2012-06-16 NOTE — Progress Notes (Signed)
Patient ambulated the entire length of the hallway with no difficulty. Assisted back to bed. Family visiting at the bedside.

## 2012-06-17 DIAGNOSIS — E039 Hypothyroidism, unspecified: Secondary | ICD-10-CM | POA: Diagnosis not present

## 2012-06-17 DIAGNOSIS — I10 Essential (primary) hypertension: Secondary | ICD-10-CM

## 2012-06-17 DIAGNOSIS — L03119 Cellulitis of unspecified part of limb: Secondary | ICD-10-CM | POA: Diagnosis not present

## 2012-06-17 DIAGNOSIS — K625 Hemorrhage of anus and rectum: Secondary | ICD-10-CM | POA: Diagnosis not present

## 2012-06-17 MED ORDER — POTASSIUM CHLORIDE CRYS ER 20 MEQ PO TBCR: 20.0000 meq | EXTENDED_RELEASE_TABLET | Freq: Two times a day (BID) | ORAL | Status: DC

## 2012-06-17 MED ORDER — LEVOFLOXACIN 500 MG PO TABS: 750.0000 mg | ORAL_TABLET | Freq: Every day | ORAL | Status: AC

## 2012-06-17 NOTE — Discharge Summary (Signed)
Physician Discharge Summary  [name] [name] AVW:[id number] DOB: 1940-08-11 DOA: 06/14/2012  PCP: Cassell Smiles., MD  Admit date: 06/14/2012 Discharge date: 06/17/2012  Time spent: Greater than 30 minutes  Recommendations for Outpatient Follow-up:  1. Follow with primary care physician within a week or so.   Discharge Diagnoses:  1. Right lower leg cellulitis, improving. 2. Rectal bleeding, secondary to hemorrhoids, resolved. No decrease in hemoglobin. 3. Obesity. 4. Hypertension. 5. Nonspecific nausea.   Discharge Condition: Stable and improving.  Diet recommendation: Low glycemic index nutrition.  Filed Weights   06/15/12 0539 06/16/12 0419 06/17/12 0440  Weight: 127 kg (279 lb 15.8 oz) 127.6 kg (281 lb 4.9 oz) 126.3 kg (278 lb 7.1 oz)    History of present illness:  This 72 year old man presents to the hospital with symptoms of right lower extremity pain and redness. Please see initial history as outlined below: HPI: [name] [name] is a 72 y.o. male past medical history of BPH, collagen vascular disease, kidney stones, bladder infections, thyroid disease and anxiety. Lately he has been taking Lasix to try to reduce the swelling in his lower extremities bilaterally. They were improving he states he could almost put on his compression hose. 3 days ago he was outside chopping wood when a large splinter of wood struck the medial side of his ankle. That night the ankle became red and swollen, the following day an area of redness turned blue, and this morning he noticed the toes of his swollen right foot were numb. He reports that urinating makes the pain in his right leg worse. He denies any fever or chills. He denies any vomiting diarrhea or recent illness. Over the past year he has had 2 other significant wounds in his right lower extremity. Both of these required professional wound care to heal.  He has been working with Dr. Jerre Simon, and was scheduled for cystoscopy today for  difficulty urinating. He mentions that several days ago his urine was dark in color and had a strong odor. Today it looks normal.  Finally he complains of a severe headache above his eyes yesterday, cramping in the fifth digits of his hands bilaterally, as well as new unusual bruising on his left forearm.  Hospital Course:  Patient was admitted and started on intravenous antibiotics. His right lower leg cellulitis began to improve and he feels it is dramatically improved since he has been in the hospital. Venous Dopplers of the right leg did not show any evidence of DVT. He did apparently have rectal bleeding which was thought to be due to hemorrhoids and this resolved during hospitalization. There was no decrease in hemoglobin. He has a history of chronic thrombocytopenia and this was stable. He has not had any fevers. He is taking by mouth intake well. He stable for discharge. He will need antibiotics for another week orally.  Procedures:  None.   Consultations:  None.  Discharge Exam: Filed Vitals:   06/16/12 0419 06/16/12 1318 06/16/12 2113 06/17/12 0440  BP: 139/77 122/80 137/89 142/86  Pulse: 85 90 90 85  Temp: 97.7 F (36.5 C) 97.8 F (36.6 C) 98.1 F (36.7 C) 98.1 F (36.7 C)  TempSrc: Oral  Oral Oral  Resp: 18 18 18 16   Height:      Weight: 127.6 kg (281 lb 4.9 oz)   126.3 kg (278 lb 7.1 oz)  SpO2: 94% 91% 92% 93%    General: He looks systemically well. Is not toxic or septic. Cardiovascular: Heart sounds are present without  gallop rhythm. Respiratory: Lung fields are clear. He is alert and oriented. Right leg-the cellulitis has clearly improved from the demarcation line. He still has ongoing erythema, however, I suspect this will improve with time.  Discharge Instructions  Discharge Orders    Future Appointments: Provider: Department: Dept Phone: Center:   07/02/2012 9:30 AM [facility] [phone] None   07/03/2012 11:30 AM Randall An, MD [facility] [place] [phone] None     Future Orders Please Complete By Expires   Diet - low sodium heart healthy      Increase activity slowly          Medication List     As of 06/17/2012  8:47 AM    STOP taking these medications         acetaminophen 325 MG tablet   Commonly known as: TYLENOL      TAKE these medications         atorvastatin 80 MG tablet   Commonly known as: LIPITOR   Take 80 mg by mouth at bedtime.      Dorzolamide HCl-Timolol Mal PF 22.3-6.8 MG/ML Soln   Apply 1 drop to eye 2 (two) times daily. Dorzolamide HCI-Timolol Maleate Ophthalmic Solution 2.23%/0.68%      furosemide 80 MG tablet   Commonly known as: LASIX   Take 80 mg by mouth 2 (two) times daily.      HYDROcodone-acetaminophen 10-325 MG per tablet   Commonly known as: NORCO   Take 1 tablet by mouth every 6 (six) hours as needed. For Pain. *Do not exceed more than 4 tablets per day*      latanoprost 0.005 % ophthalmic solution   Commonly known as: XALATAN   Place 1 drop into both eyes at bedtime.      levofloxacin 500 MG tablet   Commonly known as: LEVAQUIN   Take 1.5 tablets (750 mg total) by mouth daily.      metoprolol succinate 25 MG 24 hr tablet   Commonly known as: TOPROL-XL   Take 12.5 mg by mouth every morning.      ondansetron 4 MG tablet   Commonly known as: ZOFRAN   Take 1 tablet (4 mg total) by mouth every 8 (eight) hours as needed. For nausea      OVER THE COUNTER MEDICATION   Take 1 tablet by mouth 2 (two) times daily. OTC Acid Reflux Medication.      oxymetazoline 0.05 % nasal spray   Commonly known as: AFRIN   Place 2 sprays into the nose daily as needed. Congestion      potassium chloride SA 20 MEQ tablet   Commonly known as: K-DUR,KLOR-CON   Take 1 tablet (20 mEq total) by mouth 2 (two) times daily.      RESTORA Caps   Take 1 capsule by mouth every morning.      Tamsulosin HCl 0.4 MG Caps   Commonly known as: FLOMAX   Take 0.4 mg by  mouth at bedtime.      trolamine salicylate 10 % cream   Commonly known as: ASPERCREME   Apply 1 application topically as needed. *Applied to knee(s)*          The results of significant diagnostics from this hospitalization (including imaging, microbiology, ancillary and laboratory) are listed below for reference.    Significant Diagnostic Studies: Dg Ankle Complete Right  06/14/2012  *RADIOLOGY REPORT*  Clinical Data: Ankle pain  RIGHT ANKLE - COMPLETE 3+  VIEW  Comparison: None.  Findings: Three views of the right ankle submitted.  No acute fracture or subluxation.  Diffuse soft tissue swelling is noted. Ankle mortise is preserved.  Small plantar and posterior spur of the calcaneus.  Dystrophic calcification noted in the plantar region.  IMPRESSION: No acute fracture or subluxation.  Plantar and posterior spurring of the calcaneus.  Diffuse soft tissue swelling.   Original Report Authenticated By: Natasha Mead, M.D.    US Renal  06/15/2012  *RADIOLOGY REPORT*  Clinical Data: Benign prostatic hyperplasia.  Congestive heart failure.  Renal insufficiency.  RENAL/URINARY TRACT ULTRASOUND COMPLETE  Comparison:  CT 02/27/2012.  Findings:  Right Kidney:  14.3 cm. No hydronephrosis.  An upper pole renal lesion measures 3.3 cm and is markedly hypoechoic.  Suggestion of enhanced through transmission.  A fluid density lesion on the prior CT measured similar and size, given cross modality comparison. No enhancement within this lesion on 07/24/2010.  Left Kidney:  14.7 cm. No hydronephrosis.  An interpolar left renal lesion measures 4.0 x 3.3 cm and is markedly hypoechoic to anechoic.  No vascularity. Appears simple on 07/24/2010 CT.  Bladder:  Within normal limits.  Exam degraded by patient body habitus.  IMPRESSION:  1. No hydronephrosis. 2.  Bilateral renal lesions, favored to represent cysts, especially given appearance on 07/24/2010 CT.   Original Report Authenticated By: Jeronimo Greaves, M.D.    US Venous  Img Lower Bilateral  06/15/2012  *RADIOLOGY REPORT*  Clinical Data: Edema question DVT, history trauma, hypertension, collagen vascular disease  VENOUS DUPLEX ULTRASOUND OF BILATERAL LOWER EXTREMITIES  Technique:  Gray-scale sonography with graded compression, as well as color Doppler and duplex ultrasound, were performed to evaluate the deep venous system of both lower extremities from the level of the common femoral vein through the popliteal and proximal calf veins.  Spectral Doppler was utilized to evaluate flow at rest and with distal augmentation maneuvers.  Comparison:  Left lower extremity venous ultrasound 10/02/2008  Findings: Deep venous systems appear patent and compressible from groin through popliteal fossa bilaterally.  Spontaneous venous flow present with intact augmentation and evidence of respiratory phasicity.  No intraluminal thrombus identified.  Visualized portions of the greater saphenous systems appear patent. Subcutaneous edema identified in right calf.  IMPRESSION: No evidence of deep venous thrombosis in either lower extremity.   Original Report Authenticated By: Ulyses Southward, M.D.    Dg Abd 2 Views  06/15/2012  *RADIOLOGY REPORT*  Clinical Data: Upper abdominal pain.  Blood in stool.  Dysuria.  ABDOMEN - 2 VIEW  Comparison: 02/27/2012 stone study.  Plain film of 12/26/2011.  Findings: Degradation secondary patient body habitus.  2 upright views.  No gross free intraperitoneal air.  Limited evaluation for air fluid levels.  Supine views demonstrate gas filled colon, up to 9.5 cm.  Mildly dilated within the transverse segment.  No pneumatosis. The supine view is centered low.  Given this factor, no gross small bowel distention identified.  Probable rectal gas. Cholecystectomy clips.  IMPRESSION:  1.  Moderately degraded exam secondary patient body habitus. 2.  Nonspecific mild gaseous distention of the transverse colon, without small bowel distention or free intraperitoneal air  identified.   Original Report Authenticated By: Jeronimo Greaves, M.D.     Microbiology: Recent Results (from the past 240 hour(s))  CULTURE, BLOOD (ROUTINE X 2)     Status: Normal (Preliminary result)   Collection Time   06/14/12 11:10 AM      Component Value Range Status  Comment   Specimen Description BLOOD RIGHT ANTECUBITAL   Final    Special Requests BOTTLES DRAWN AEROBIC ONLY 10CC  IMMUNE:COMPRM   Final    Culture NO GROWTH 1 DAY   Final    Report Status PENDING   Incomplete   CULTURE, BLOOD (ROUTINE X 2)     Status: Normal (Preliminary result)   Collection Time   06/14/12  2:07 PM      Component Value Range Status Comment   Specimen Description BLOOD LEFT ANTECUBITAL   Final    Special Requests     Final    Value: BOTTLES DRAWN AEROBIC AND ANAEROBIC AEB=15CC ANA=12CC    Culture NO GROWTH 1 DAY   Final    Report Status PENDING   Incomplete      Labs: Basic Metabolic Panel:  Lab 06/16/12 6962 06/15/12 0401 06/14/12 1110  NA 136 137 140  K 3.9 3.4* 3.7  CL 97 97 102  CO2 35* 33* 31  GLUCOSE 123* 132* 118*  BUN 10 10 13   CREATININE 0.99 1.02 1.00  CALCIUM 9.3 8.9 9.4  MG -- -- 2.0  PHOS -- -- --       CBC:  Lab 06/16/12 0306 06/15/12 0401 06/14/12 1110  WBC 10.3 12.1* 11.0*  NEUTROABS -- -- 9.4*  HGB 13.4 13.0 13.6  HCT 40.8 39.9 41.2  MCV 82.1 82.6 81.6  PLT 90* 89* 94*     BNP: BNP (last 3 results)  Basename 12/26/11 1609  PROBNP 31.8         Signed:  GOSRANI,NIMISH C  [facility] 06/17/2012, 8:47 AM

## 2012-06-17 NOTE — Progress Notes (Signed)
Discharge Summary: a/o.vss. Saline lock removed. Discharge instructions given. Prescriptions given. Pt verbalized understanding of instructions. Left floor via wheelchair with nursing staff and family member.

## 2012-06-17 NOTE — ED Provider Notes (Signed)
Medical screening examination/treatment/procedure(s) were performed by non-physician practitioner and as supervising physician I was immediately available for consultation/collaboration.   Frazier Balfour L Finnean Cerami, MD 06/17/12 1516 

## 2012-06-18 MED FILL — Dorzolamide HCl-Timolol Maleate Ophth Soln 2-0.5%: OPHTHALMIC | Qty: 10 | Status: AC

## 2012-06-19 LAB — CULTURE, BLOOD (ROUTINE X 2)

## 2012-06-20 DIAGNOSIS — R0789 Other chest pain: Secondary | ICD-10-CM | POA: Diagnosis not present

## 2012-06-20 DIAGNOSIS — L02419 Cutaneous abscess of limb, unspecified: Secondary | ICD-10-CM | POA: Diagnosis not present

## 2012-06-20 DIAGNOSIS — L03119 Cellulitis of unspecified part of limb: Secondary | ICD-10-CM | POA: Diagnosis not present

## 2012-06-20 DIAGNOSIS — Z6841 Body Mass Index (BMI) 40.0 and over, adult: Secondary | ICD-10-CM | POA: Diagnosis not present

## 2012-06-26 DIAGNOSIS — L0291 Cutaneous abscess, unspecified: Secondary | ICD-10-CM | POA: Diagnosis not present

## 2012-06-26 DIAGNOSIS — L0390 Cellulitis, unspecified: Secondary | ICD-10-CM | POA: Diagnosis not present

## 2012-06-27 DIAGNOSIS — G894 Chronic pain syndrome: Secondary | ICD-10-CM | POA: Diagnosis not present

## 2012-06-27 DIAGNOSIS — IMO0002 Reserved for concepts with insufficient information to code with codable children: Secondary | ICD-10-CM | POA: Diagnosis not present

## 2012-06-27 DIAGNOSIS — M545 Low back pain, unspecified: Secondary | ICD-10-CM | POA: Diagnosis not present

## 2012-06-27 DIAGNOSIS — M1710 Unilateral primary osteoarthritis, unspecified knee: Secondary | ICD-10-CM | POA: Diagnosis not present

## 2012-06-27 DIAGNOSIS — IMO0001 Reserved for inherently not codable concepts without codable children: Secondary | ICD-10-CM | POA: Diagnosis not present

## 2012-06-27 DIAGNOSIS — M47817 Spondylosis without myelopathy or radiculopathy, lumbosacral region: Secondary | ICD-10-CM | POA: Diagnosis not present

## 2012-06-27 DIAGNOSIS — M5137 Other intervertebral disc degeneration, lumbosacral region: Secondary | ICD-10-CM | POA: Diagnosis not present

## 2012-06-27 DIAGNOSIS — M25569 Pain in unspecified knee: Secondary | ICD-10-CM | POA: Diagnosis not present

## 2012-07-02 ENCOUNTER — Encounter (HOSPITAL_COMMUNITY): Payer: Medicare Other | Attending: Oncology

## 2012-07-02 DIAGNOSIS — D696 Thrombocytopenia, unspecified: Secondary | ICD-10-CM | POA: Insufficient documentation

## 2012-07-02 DIAGNOSIS — D72829 Elevated white blood cell count, unspecified: Secondary | ICD-10-CM | POA: Insufficient documentation

## 2012-07-02 LAB — CBC WITH DIFFERENTIAL/PLATELET
Eosinophils Absolute: 0.1 10*3/uL (ref 0.0–0.7)
Lymphocytes Relative: 7 % — ABNORMAL LOW (ref 12–46)
Lymphs Abs: 1.1 10*3/uL (ref 0.7–4.0)
MCH: 26.9 pg (ref 26.0–34.0)
Neutrophils Relative %: 83 % — ABNORMAL HIGH (ref 43–77)
Platelets: 159 10*3/uL (ref 150–400)
RBC: 5.54 MIL/uL (ref 4.22–5.81)
WBC: 16.2 10*3/uL — ABNORMAL HIGH (ref 4.0–10.5)

## 2012-07-02 NOTE — Progress Notes (Signed)
[name] [name]'s reason for visit today are for labs as scheduled per MD orders.  Venipuncture performed with a 23 gauge butterfly needle to R Antecubital.  [name] [name] tolerated venipuncture well and without incident; questions were answered and patient was discharged.

## 2012-07-03 ENCOUNTER — Encounter (HOSPITAL_COMMUNITY): Payer: Self-pay | Admitting: Oncology

## 2012-07-03 ENCOUNTER — Encounter (HOSPITAL_BASED_OUTPATIENT_CLINIC_OR_DEPARTMENT_OTHER): Payer: Medicare Other | Admitting: Oncology

## 2012-07-03 VITALS — BP 137/85 | HR 109 | Temp 97.5°F | Resp 20 | Wt 283.2 lb

## 2012-07-03 DIAGNOSIS — D696 Thrombocytopenia, unspecified: Secondary | ICD-10-CM

## 2012-07-03 DIAGNOSIS — D72829 Elevated white blood cell count, unspecified: Secondary | ICD-10-CM | POA: Diagnosis not present

## 2012-07-03 NOTE — Patient Instructions (Addendum)
[facility] Discharge Instructions  RECOMMENDATIONS MADE BY THE CONSULTANT AND ANY TEST RESULTS WILL BE SENT TO YOUR REFERRING PHYSICIAN.  Lab work the end of August. See MD again after lab work. Report any issues/concerns to clinic as needed prior to appointments.  Thank you for choosing [facility] to provide your oncology and hematology care.  To afford each patient quality time with our providers, please arrive at least 15 minutes before your scheduled appointment time.  With your help, our goal is to use those 15 minutes to complete the necessary work-up to ensure our physicians have the information they need to help with your evaluation and healthcare recommendations.    Effective January 1st, 2014, we ask that you re-schedule your appointment with our physicians should you arrive 10 or more minutes late for your appointment.  We strive to give you quality time with our providers, and arriving late affects you and other patients whose appointments are after yours.    Again, thank you for choosing [facility].  Our hope is that these requests will decrease the amount of time that you wait before being seen by our physicians.       _____________________________________________________________  Should you have questions after your visit to [facility], please contact our office at [phone] between the hours of 8:30 a.m. and 5:00 p.m.  Voicemails left after 4:30 p.m. will not be returned until the following business day.  For prescription refill requests, have your pharmacy contact our office with your prescription refill request.

## 2012-07-03 NOTE — Progress Notes (Signed)
Problem #1 intermittent thrombocytopenia and mild leukocytosis which are stable. His platelets actually today were within normal range. His white count remains mildly elevated which is nonspecific but does have a wound on the left lower leg from drainage secondary to either trauma or from breakdown the skin from this CHF etc.  He has several other medical problems making him susceptible to infection including fatty liver disease, chronic leg edema, morbid obesity, and his white count therefore could be reactive realistically.  His smear has not revealed leukemic cells.  Since his platelets are in the normal range and he does not know of any changes in his medications presently I cannot explain his blood counts. I think he just needs to be observed we will see her back in 6 months. He is fine with this plan.

## 2012-07-04 ENCOUNTER — Ambulatory Visit (HOSPITAL_COMMUNITY): Payer: Medicare Other | Admitting: Oncology

## 2012-07-09 DIAGNOSIS — E039 Hypothyroidism, unspecified: Secondary | ICD-10-CM | POA: Diagnosis not present

## 2012-07-09 DIAGNOSIS — Z6841 Body Mass Index (BMI) 40.0 and over, adult: Secondary | ICD-10-CM | POA: Diagnosis not present

## 2012-07-09 DIAGNOSIS — M7989 Other specified soft tissue disorders: Secondary | ICD-10-CM | POA: Diagnosis not present

## 2012-07-19 ENCOUNTER — Emergency Department (HOSPITAL_COMMUNITY)
Admission: EM | Admit: 2012-07-19 | Discharge: 2012-07-20 | Disposition: A | Discharge status: Home / Self Care | Payer: Medicare Other | Attending: Emergency Medicine | Admitting: Emergency Medicine

## 2012-07-19 ENCOUNTER — Encounter (HOSPITAL_COMMUNITY): Payer: Self-pay | Admitting: *Deleted

## 2012-07-19 ENCOUNTER — Emergency Department (HOSPITAL_COMMUNITY): Payer: Medicare Other

## 2012-07-19 DIAGNOSIS — Z872 Personal history of diseases of the skin and subcutaneous tissue: Secondary | ICD-10-CM | POA: Diagnosis not present

## 2012-07-19 DIAGNOSIS — M545 Low back pain, unspecified: Secondary | ICD-10-CM | POA: Insufficient documentation

## 2012-07-19 DIAGNOSIS — R110 Nausea: Secondary | ICD-10-CM | POA: Diagnosis not present

## 2012-07-19 DIAGNOSIS — F411 Generalized anxiety disorder: Secondary | ICD-10-CM | POA: Diagnosis not present

## 2012-07-19 DIAGNOSIS — L0390 Cellulitis, unspecified: Secondary | ICD-10-CM

## 2012-07-19 DIAGNOSIS — Z8679 Personal history of other diseases of the circulatory system: Secondary | ICD-10-CM | POA: Diagnosis not present

## 2012-07-19 DIAGNOSIS — Z9889 Other specified postprocedural states: Secondary | ICD-10-CM | POA: Diagnosis not present

## 2012-07-19 DIAGNOSIS — M1990 Unspecified osteoarthritis, unspecified site: Secondary | ICD-10-CM | POA: Insufficient documentation

## 2012-07-19 DIAGNOSIS — Z8639 Personal history of other endocrine, nutritional and metabolic disease: Secondary | ICD-10-CM | POA: Insufficient documentation

## 2012-07-19 DIAGNOSIS — G8929 Other chronic pain: Secondary | ICD-10-CM | POA: Insufficient documentation

## 2012-07-19 DIAGNOSIS — L03119 Cellulitis of unspecified part of limb: Secondary | ICD-10-CM | POA: Diagnosis not present

## 2012-07-19 DIAGNOSIS — Z8711 Personal history of peptic ulcer disease: Secondary | ICD-10-CM | POA: Diagnosis not present

## 2012-07-19 DIAGNOSIS — I509 Heart failure, unspecified: Secondary | ICD-10-CM | POA: Insufficient documentation

## 2012-07-19 DIAGNOSIS — E669 Obesity, unspecified: Secondary | ICD-10-CM | POA: Diagnosis not present

## 2012-07-19 DIAGNOSIS — Z87448 Personal history of other diseases of urinary system: Secondary | ICD-10-CM | POA: Insufficient documentation

## 2012-07-19 DIAGNOSIS — R0602 Shortness of breath: Secondary | ICD-10-CM | POA: Diagnosis not present

## 2012-07-19 DIAGNOSIS — N400 Enlarged prostate without lower urinary tract symptoms: Secondary | ICD-10-CM | POA: Insufficient documentation

## 2012-07-19 DIAGNOSIS — L02419 Cutaneous abscess of limb, unspecified: Secondary | ICD-10-CM | POA: Insufficient documentation

## 2012-07-19 DIAGNOSIS — K219 Gastro-esophageal reflux disease without esophagitis: Secondary | ICD-10-CM | POA: Diagnosis not present

## 2012-07-19 DIAGNOSIS — I10 Essential (primary) hypertension: Secondary | ICD-10-CM | POA: Diagnosis not present

## 2012-07-19 DIAGNOSIS — Z79899 Other long term (current) drug therapy: Secondary | ICD-10-CM | POA: Diagnosis not present

## 2012-07-19 DIAGNOSIS — Z87891 Personal history of nicotine dependence: Secondary | ICD-10-CM | POA: Insufficient documentation

## 2012-07-19 DIAGNOSIS — Z87442 Personal history of urinary calculi: Secondary | ICD-10-CM | POA: Insufficient documentation

## 2012-07-19 DIAGNOSIS — Z862 Personal history of diseases of the blood and blood-forming organs and certain disorders involving the immune mechanism: Secondary | ICD-10-CM | POA: Diagnosis not present

## 2012-07-19 DIAGNOSIS — R0989 Other specified symptoms and signs involving the circulatory and respiratory systems: Secondary | ICD-10-CM | POA: Diagnosis not present

## 2012-07-19 DIAGNOSIS — E785 Hyperlipidemia, unspecified: Secondary | ICD-10-CM | POA: Insufficient documentation

## 2012-07-19 MED ORDER — HYDROMORPHONE HCL PF 2 MG/ML IJ SOLN
2.0000 mg | Freq: Once | INTRAMUSCULAR | Status: AC
  Administered 2012-07-19: 2 mg via INTRAMUSCULAR
  Filled 2012-07-19: qty 1

## 2012-07-19 MED ORDER — DOXYCYCLINE HYCLATE 100 MG PO CAPS: 100.0000 mg | ORAL_CAPSULE | Freq: Two times a day (BID) | ORAL | Status: DC

## 2012-07-19 MED ORDER — HYDROMORPHONE HCL 2 MG PO TABS: 2.0000 mg | ORAL_TABLET | ORAL | Status: DC | PRN

## 2012-07-19 MED ORDER — ONDANSETRON 8 MG PO TBDP
8.0000 mg | ORAL_TABLET | Freq: Once | ORAL | Status: AC
  Administered 2012-07-19: 8 mg via ORAL
  Filled 2012-07-19: qty 1

## 2012-07-19 NOTE — ED Notes (Signed)
Low back pain for 3-4 days .  No recent injury.  "balance is off" headache.Lt leg cellulitis.

## 2012-07-19 NOTE — ED Provider Notes (Signed)
History     CSN: [id number]  Arrival date & time 07/19/12  1700   First MD Initiated Contact with Patient 07/19/12 2225      Chief Complaint  Patient presents with  . Back Pain    (Consider location/radiation/quality/duration/timing/severity/associated sxs/prior treatment) Patient is a 72 y.o. male presenting with back pain. The history is provided by the patient.  Back Pain Associated symptoms: no abdominal pain, no dysuria, no fever and no headaches   patient with a history of chronic back pain. Followed by spine specialist. Also with a history of lower leg edema and trouble with cellulitis for the past several months. Last antibiotics were 1-2 weeks ago. Patient comes in today for an exacerbation of his back pain. Patient is taking Percocet for the back pain did not take any today but has not been helping very much she's had back pain exacerbation for the past 3-4 days. No radiation into the legs. Patient also with a history of cellulitis and some weeping of the lower legs as described above. No injury. Patient states the back pain is 10 out of 10 nonradiating described as sharp and worse with movements.  Past Medical History  Diagnosis Date  . CHF (congestive heart failure)   . Hypertension   . Obesity   . Hyperlipidemia   . PUD (peptic ulcer disease)   . GERD (gastroesophageal reflux disease)   . S/P endoscopy 2007, 2012    2007: nl, May 2012: antral erosions  . Osteoarthritis   . S/P colonoscopy 2007, Feb and March 2012    hx of adenomas, left-sided diverticula, On 07/2010 TCS, fresh blood and clot noted coming from TI.  Marland Kitchen Anxiety   . Thyroid disease     hypothyroid  . Ringing in ears   . History of kidney stones   . History of bladder infections   . Heel spur     right heel  . Collagen vascular disease   . Thrombocytopenia 12/30/2011    Stable  . BPH (benign prostatic hyperplasia)     Past Surgical History  Procedure Laterality Date  . Knee arthroscopy    .  Rotator cuff repair    . Cholecystectomy    . Lumbar spine surgery    . Small bowel capsule study  07/2011    ?transient focal ischemia in distal small bowel to explain GI bleeding  . Esophagogastroduodenoscopy  Aug 2013    [facility], single 5mm pedunculated polyp otherwise normal. HYPERPLASTIC, NEGATIVE h.pylori    Family History  Problem Relation Age of Onset  . Colon cancer Father 33    deceased  . Prostate cancer Father   . Pancreatic cancer Mother 14    deceased    History  Substance Use Topics  . Smoking status: Former Smoker    Types: Cigars    Quit date: 08/10/1973  . Smokeless tobacco: Never Used  . Alcohol Use: No      Review of Systems  Constitutional: Negative for fever.  HENT: Negative for congestion and neck pain.   Respiratory: Positive for shortness of breath.   Gastrointestinal: Positive for nausea. Negative for vomiting and abdominal pain.  Genitourinary: Negative for dysuria.  Musculoskeletal: Positive for back pain.  Skin: Negative for rash.  Neurological: Negative for headaches.  Hematological: Does not bruise/bleed easily.  Psychiatric/Behavioral: Negative for confusion.    Allergies  Aspirin  Home Medications   Current Outpatient Rx  Name  Route  Sig  Dispense  Refill  . atorvastatin (  LIPITOR) 80 MG tablet   Oral   Take 80 mg by mouth at bedtime.         . docusate sodium (COLACE) 100 MG capsule   Oral   Take 100 mg by mouth at bedtime.         . Dorzolamide HCl-Timolol Mal PF 22.3-6.8 MG/ML SOLN   Ophthalmic   Apply 1 drop to eye 2 (two) times daily. Dorzolamide HCI-Timolol Maleate Ophthalmic Solution 2.23%/0.68%         . furosemide (LASIX) 80 MG tablet   Oral   Take 80 mg by mouth 2 (two) times daily.          Marland Kitchen latanoprost (XALATAN) 0.005 % ophthalmic solution   Both Eyes   Place 1 drop into both eyes at bedtime.         . metoprolol succinate (TOPROL-XL) 25 MG 24 hr tablet   Oral   Take 12.5 mg by mouth every  morning.          . ondansetron (ZOFRAN) 4 MG tablet   Oral   Take 1 tablet (4 mg total) by mouth every 8 (eight) hours as needed. For nausea   30 tablet   3   . oxyCODONE-acetaminophen (PERCOCET) 10-325 MG per tablet   Oral   Take 1 tablet by mouth every 4 (four) hours as needed for pain.         . pantoprazole (PROTONIX) 40 MG tablet   Oral   Take 40 mg by mouth 2 (two) times daily.         [name] (LUBRICANT EYE DROPS) 0.4-0.3 % SOLN   Ophthalmic   Apply 1 drop to eye daily as needed (for lubricant eye drops).         . potassium chloride SA (K-DUR,KLOR-CON) 20 MEQ tablet   Oral   Take 1 tablet (20 mEq total) by mouth 2 (two) times daily.   60 tablet   0   . Tamsulosin HCl (FLOMAX) 0.4 MG CAPS   Oral   Take 0.4 mg by mouth at bedtime.          . trolamine salicylate (ASPERCREME) 10 % cream   Topical   Apply 1 application topically as needed. *Applied to knee(s)*         . doxycycline (VIBRAMYCIN) 100 MG capsule   Oral   Take 1 capsule (100 mg total) by mouth 2 (two) times daily.   14 capsule   0   . HYDROmorphone (DILAUDID) 2 MG tablet   Oral   Take 1 tablet (2 mg total) by mouth every 4 (four) hours as needed for pain.   20 tablet   0     BP 127/66  Pulse 82  Temp(Src) 97.9 F (36.6 C) (Oral)  Resp 20  Ht 5\' 8"  (1.727 m)  Wt 280 lb (127.007 kg)  BMI 42.58 kg/m2  SpO2 96%  Physical Exam  Nursing note and vitals reviewed. Constitutional: He is oriented to person, place, and time. He appears well-developed and well-nourished.  HENT:  Head: Normocephalic and atraumatic.  Eyes: Conjunctivae and EOM are normal. Pupils are equal, round, and reactive to light.  Neck: Normal range of motion. Neck supple.  Cardiovascular: Normal rate, regular rhythm and normal heart sounds.   No murmur heard. Pulmonary/Chest: Effort normal and breath sounds normal. No respiratory distress. He has no wheezes. He has no rales.   Abdominal: Soft. Bowel sounds are normal. There is no  tenderness.  Musculoskeletal: Normal range of motion. He exhibits edema.  Bilateral lower leg cellulitis erythema measuring about 10 cm on both anterior part of the legs. Associated with some edema. Both chronic.  Neurological: He is alert and oriented to person, place, and time. No cranial nerve deficit. He exhibits normal muscle tone. Coordination normal.  Skin: Skin is warm. There is erythema.    ED Course  Procedures (including critical care time)  Labs Reviewed - No data to display Dg Chest 2 View  07/19/2012  *RADIOLOGY REPORT*  Clinical Data: Shortness of breath and congestion.  CHEST - 2 VIEW  Comparison: 02/27/2012 and prior chest radiographs  Findings: The cardiomediastinal silhouette is unremarkable. There is no evidence of focal airspace disease, pulmonary edema, suspicious pulmonary nodule/mass, pleural effusion, or pneumothorax. No acute bony abnormalities are identified.  IMPRESSION: No evidence of active cardiopulmonary disease.   Original Report Authenticated By: Harmon Pier, M.D.      1. Chronic back pain   2. Cellulitis       MDM   Patient already followed by a spine specialist for chronic back pain. Symptoms are consistent with an exacerbation of his low back pain. But worse for the last 3-4 days. No injury. Patient already has Percocet 10-325 mg tablets and has oral Zofran at home. Not completely controlling the pain. However patient did not take any Percocet today. No injury to the back.  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 Has been off antibiotics for 1-2 weeks. Some increased redness said persistent edema in that area. He is on Lasix and his doctors have been addressing the cellulitis. Will restart doxycycline. Patient will followup with her primary care Dr. In the next few days. Cellulitis is not severe and does not require admission at this time.  Chest x-ray ordered because patient  has a history of CHF and has described shortness of breath however he is able to lay flat in the emergency department.  Chest x-ray was negative for pulmonary edema congestive heart failure pneumonia or pneumothorax.     Shelda Jakes, MD 07/20/12 [phone]

## 2012-07-23 DIAGNOSIS — M7989 Other specified soft tissue disorders: Secondary | ICD-10-CM | POA: Diagnosis not present

## 2012-07-23 DIAGNOSIS — I10 Essential (primary) hypertension: Secondary | ICD-10-CM | POA: Diagnosis not present

## 2012-07-23 DIAGNOSIS — Z6841 Body Mass Index (BMI) 40.0 and over, adult: Secondary | ICD-10-CM | POA: Diagnosis not present

## 2012-07-23 DIAGNOSIS — E039 Hypothyroidism, unspecified: Secondary | ICD-10-CM | POA: Diagnosis not present

## 2012-07-23 DIAGNOSIS — N400 Enlarged prostate without lower urinary tract symptoms: Secondary | ICD-10-CM | POA: Diagnosis not present

## 2012-07-30 ENCOUNTER — Other Ambulatory Visit (HOSPITAL_COMMUNITY): Payer: Self-pay | Admitting: Physical Medicine and Rehabilitation

## 2012-07-30 DIAGNOSIS — M545 Low back pain, unspecified: Secondary | ICD-10-CM

## 2012-07-31 DIAGNOSIS — M5137 Other intervertebral disc degeneration, lumbosacral region: Secondary | ICD-10-CM | POA: Diagnosis not present

## 2012-07-31 DIAGNOSIS — G894 Chronic pain syndrome: Secondary | ICD-10-CM | POA: Diagnosis not present

## 2012-07-31 DIAGNOSIS — M545 Low back pain, unspecified: Secondary | ICD-10-CM | POA: Diagnosis not present

## 2012-07-31 DIAGNOSIS — M48062 Spinal stenosis, lumbar region with neurogenic claudication: Secondary | ICD-10-CM | POA: Diagnosis not present

## 2012-07-31 DIAGNOSIS — M961 Postlaminectomy syndrome, not elsewhere classified: Secondary | ICD-10-CM | POA: Diagnosis not present

## 2012-07-31 DIAGNOSIS — IMO0002 Reserved for concepts with insufficient information to code with codable children: Secondary | ICD-10-CM | POA: Diagnosis not present

## 2012-08-01 ENCOUNTER — Ambulatory Visit (HOSPITAL_COMMUNITY)
Admission: RE | Admit: 2012-08-01 | Discharge: 2012-08-01 | Disposition: A | Discharge status: Home / Self Care | Payer: Medicare Other | Source: Ambulatory Visit | Attending: Physical Medicine and Rehabilitation | Admitting: Physical Medicine and Rehabilitation

## 2012-08-01 DIAGNOSIS — M51379 Other intervertebral disc degeneration, lumbosacral region without mention of lumbar back pain or lower extremity pain: Secondary | ICD-10-CM | POA: Diagnosis not present

## 2012-08-01 DIAGNOSIS — M5126 Other intervertebral disc displacement, lumbar region: Secondary | ICD-10-CM | POA: Diagnosis not present

## 2012-08-01 DIAGNOSIS — M48061 Spinal stenosis, lumbar region without neurogenic claudication: Secondary | ICD-10-CM | POA: Diagnosis not present

## 2012-08-01 DIAGNOSIS — M545 Low back pain, unspecified: Secondary | ICD-10-CM | POA: Diagnosis not present

## 2012-08-01 DIAGNOSIS — M5137 Other intervertebral disc degeneration, lumbosacral region: Secondary | ICD-10-CM | POA: Insufficient documentation

## 2012-08-01 DIAGNOSIS — M47817 Spondylosis without myelopathy or radiculopathy, lumbosacral region: Secondary | ICD-10-CM | POA: Diagnosis not present

## 2012-08-16 ENCOUNTER — Encounter (HOSPITAL_COMMUNITY): Payer: Self-pay | Admitting: Pharmacy Technician

## 2012-08-16 ENCOUNTER — Other Ambulatory Visit: Payer: Self-pay | Admitting: Neurosurgery

## 2012-08-16 DIAGNOSIS — M5126 Other intervertebral disc displacement, lumbar region: Secondary | ICD-10-CM | POA: Diagnosis not present

## 2012-08-21 ENCOUNTER — Encounter (HOSPITAL_COMMUNITY): Payer: Self-pay

## 2012-08-21 ENCOUNTER — Encounter (HOSPITAL_COMMUNITY)
Admission: RE | Admit: 2012-08-21 | Discharge: 2012-08-21 | Disposition: A | Discharge status: Home / Self Care | Payer: Medicare Other | Source: Ambulatory Visit | Attending: Neurosurgery | Admitting: Neurosurgery

## 2012-08-21 DIAGNOSIS — Z4682 Encounter for fitting and adjustment of non-vascular catheter: Secondary | ICD-10-CM | POA: Diagnosis not present

## 2012-08-21 DIAGNOSIS — R197 Diarrhea, unspecified: Secondary | ICD-10-CM | POA: Diagnosis not present

## 2012-08-21 DIAGNOSIS — Z01812 Encounter for preprocedural laboratory examination: Secondary | ICD-10-CM | POA: Diagnosis not present

## 2012-08-21 DIAGNOSIS — Z87891 Personal history of nicotine dependence: Secondary | ICD-10-CM | POA: Diagnosis not present

## 2012-08-21 DIAGNOSIS — M6281 Muscle weakness (generalized): Secondary | ICD-10-CM | POA: Diagnosis not present

## 2012-08-21 DIAGNOSIS — R110 Nausea: Secondary | ICD-10-CM | POA: Diagnosis not present

## 2012-08-21 DIAGNOSIS — R109 Unspecified abdominal pain: Secondary | ICD-10-CM | POA: Diagnosis not present

## 2012-08-21 DIAGNOSIS — K219 Gastro-esophageal reflux disease without esophagitis: Secondary | ICD-10-CM | POA: Diagnosis present

## 2012-08-21 DIAGNOSIS — I10 Essential (primary) hypertension: Secondary | ICD-10-CM | POA: Diagnosis not present

## 2012-08-21 DIAGNOSIS — G4733 Obstructive sleep apnea (adult) (pediatric): Secondary | ICD-10-CM | POA: Diagnosis not present

## 2012-08-21 DIAGNOSIS — M545 Low back pain, unspecified: Secondary | ICD-10-CM | POA: Diagnosis not present

## 2012-08-21 DIAGNOSIS — R143 Flatulence: Secondary | ICD-10-CM | POA: Diagnosis not present

## 2012-08-21 DIAGNOSIS — F411 Generalized anxiety disorder: Secondary | ICD-10-CM | POA: Diagnosis present

## 2012-08-21 DIAGNOSIS — M549 Dorsalgia, unspecified: Secondary | ICD-10-CM | POA: Diagnosis not present

## 2012-08-21 DIAGNOSIS — E7800 Pure hypercholesterolemia, unspecified: Secondary | ICD-10-CM | POA: Diagnosis present

## 2012-08-21 DIAGNOSIS — M5126 Other intervertebral disc displacement, lumbar region: Secondary | ICD-10-CM | POA: Diagnosis not present

## 2012-08-21 DIAGNOSIS — Z5189 Encounter for other specified aftercare: Secondary | ICD-10-CM | POA: Diagnosis not present

## 2012-08-21 DIAGNOSIS — M1990 Unspecified osteoarthritis, unspecified site: Secondary | ICD-10-CM | POA: Diagnosis present

## 2012-08-21 DIAGNOSIS — R933 Abnormal findings on diagnostic imaging of other parts of digestive tract: Secondary | ICD-10-CM | POA: Diagnosis not present

## 2012-08-21 DIAGNOSIS — R279 Unspecified lack of coordination: Secondary | ICD-10-CM | POA: Diagnosis not present

## 2012-08-21 DIAGNOSIS — E039 Hypothyroidism, unspecified: Secondary | ICD-10-CM | POA: Diagnosis present

## 2012-08-21 DIAGNOSIS — G4730 Sleep apnea, unspecified: Secondary | ICD-10-CM | POA: Diagnosis present

## 2012-08-21 DIAGNOSIS — R269 Unspecified abnormalities of gait and mobility: Secondary | ICD-10-CM | POA: Diagnosis not present

## 2012-08-21 DIAGNOSIS — M47817 Spondylosis without myelopathy or radiculopathy, lumbosacral region: Secondary | ICD-10-CM | POA: Diagnosis not present

## 2012-08-21 DIAGNOSIS — Z981 Arthrodesis status: Secondary | ICD-10-CM | POA: Diagnosis not present

## 2012-08-21 DIAGNOSIS — M533 Sacrococcygeal disorders, not elsewhere classified: Secondary | ICD-10-CM | POA: Diagnosis not present

## 2012-08-21 DIAGNOSIS — K560 Paralytic ileus: Secondary | ICD-10-CM | POA: Diagnosis not present

## 2012-08-21 DIAGNOSIS — M48061 Spinal stenosis, lumbar region without neurogenic claudication: Secondary | ICD-10-CM | POA: Diagnosis not present

## 2012-08-21 DIAGNOSIS — I5032 Chronic diastolic (congestive) heart failure: Secondary | ICD-10-CM | POA: Diagnosis not present

## 2012-08-21 DIAGNOSIS — R293 Abnormal posture: Secondary | ICD-10-CM | POA: Diagnosis not present

## 2012-08-21 DIAGNOSIS — I509 Heart failure, unspecified: Secondary | ICD-10-CM | POA: Diagnosis not present

## 2012-08-21 DIAGNOSIS — R141 Gas pain: Secondary | ICD-10-CM | POA: Diagnosis not present

## 2012-08-21 DIAGNOSIS — E785 Hyperlipidemia, unspecified: Secondary | ICD-10-CM | POA: Diagnosis present

## 2012-08-21 DIAGNOSIS — N400 Enlarged prostate without lower urinary tract symptoms: Secondary | ICD-10-CM | POA: Diagnosis present

## 2012-08-21 DIAGNOSIS — K5900 Constipation, unspecified: Secondary | ICD-10-CM | POA: Diagnosis not present

## 2012-08-21 DIAGNOSIS — Z4889 Encounter for other specified surgical aftercare: Secondary | ICD-10-CM | POA: Diagnosis not present

## 2012-08-21 DIAGNOSIS — Q7649 Other congenital malformations of spine, not associated with scoliosis: Secondary | ICD-10-CM | POA: Diagnosis not present

## 2012-08-21 HISTORY — DX: Cellulitis of right lower limb: L03.116

## 2012-08-21 HISTORY — DX: Sleep apnea, unspecified: G47.30

## 2012-08-21 HISTORY — DX: Cellulitis of right lower limb: L03.115

## 2012-08-21 LAB — TYPE AND SCREEN: ABO/RH(D): O POS

## 2012-08-21 LAB — CBC
MCH: 26.6 pg (ref 26.0–34.0)
MCHC: 33.6 g/dL (ref 30.0–36.0)
Platelets: 97 10*3/uL — ABNORMAL LOW (ref 150–400)

## 2012-08-21 LAB — BASIC METABOLIC PANEL
BUN: 14 mg/dL (ref 6–23)
Calcium: 9.4 mg/dL (ref 8.4–10.5)
GFR calc non Af Amer: 84 mL/min — ABNORMAL LOW (ref >90)
Glucose, Bld: 96 mg/dL (ref 70–99)

## 2012-08-21 LAB — ABO/RH: ABO/RH(D): O POS

## 2012-08-21 LAB — SURGICAL PCR SCREEN: Staphylococcus aureus: NEGATIVE

## 2012-08-21 MED ORDER — DEXTROSE 5 % IV SOLN
3.0000 g | INTRAVENOUS | Status: AC
  Administered 2012-08-22: 3 g via INTRAVENOUS
  Filled 2012-08-21: qty 3000

## 2012-08-21 NOTE — Progress Notes (Signed)
Anesthesia PAT Evaluation:  Patient is a 72 year old male scheduled for L4-5 PLIF by Dr. Franky Macho on 08/22/12.  His PAT appointment was on 08/22/12 at 1300.  History includes morbid obesity, former smoker, CHF (right heart failure), HLD, GERD, PUD, OA, anxiety, tinnitus, nephrolithiasis, "intermittent" thrombocytopenia and mild leukocytosis (followed by hematologist Dr. Mariel Sleet with observation currently being recommended), BPH, OSA with nighttime home oxygen (2L/[place]) since he can't tolerate CPAP, venous insufficiency with history of LE cellulitis, cholecystectomy '09, L4-5 foraminotomy '03.  Of note, he was noted to have hypoxemia following his back surgery in 2003 and was seen by pulmonology.  They treated him for bronchitis and felt his hypoxemia was related to atelectasis with underlying obesity (possible hypoventilation syndrome) and OSA.  PCP is Dr. Elfredia Nevins.  Urologist is Dr. Jerre Simon.  Cardiologist is Dr. Rennis Golden [facility] [place]), last visit 02/23/12.  He is followed there for venous insufficiency with lower extremity edema.  Patient denies known CAD/MI history and states he has never required hospitalization for his CHF.  He denies chest pain and SOB at rest.  He has chronic DOE with exertion.  He is admittedly sedentary--even more so since his back and leg pain has progressed over the last several months.  He is primarily just walking inside of his house.  He is in a wheelchair today.  His weight is up 2 lbs since his last visit with Dr. Rennis Golden in October 2013 (but was down from 301 lbs at that time).  Patient feels his LE edema is stable.  He is not being treated for any active cellulitis.    EKG on 02/27/12 showed ST @ 108 (done during admission for E. Coli UTI).  HR today is 84.   Echo on 10/02/08 showed: Left ventricle: The cavity size was normal. There was moderate concentric hypertrophy. Systolic function was normal. The estimated ejection fraction was in the range of 60% to 65%. Wall motion  was normal; there were no regional wall motion abnormalities. Aortic valve: Trileaflet; normal thickness leaflets. Mobility was not restricted. Doppler: Transvalvular velocity was within the normal range. There was no stenosis. No regurgitation. Aorta: Aortic root: The aortic root was normal in size. Mitral valve: Structurally normal valve. Mobility was not restricted. Doppler: Transvalvular velocity was within the normal range. There was no evidence for stenosis. No regurgitation. Peak gradient: 4mm Hg (D). Left atrium: The atrium was normal in size. Right ventricle: The cavity size was normal. Wall thickness was normal. Systolic function was normal. Pulmonic valve: Doppler: Transvalvular velocity was within the normal range. There was no evidence for stenosis. No significant regurgitation. Tricuspid valve: Structurally normal valve. Doppler: Transvalvular velocity was within the normal range. Trivial regurgitation. Pulmonary artery: The main pulmonary artery was normal-sized. Systolic pressure was within the normal range. Right atrium: The atrium was normal in size. Pericardium: There was no pericardial effusion. Inferior vena cava: The vessel was normal in size.  He had a nuclear stress test on 01/10/07 at [facility] that showed evidence of mild ischemia and mid inferior and mid inferior lateral regions. The post stress EF was 62%. No significant wall motion abnormalities noted. Ventricular function is preserved. EKG was negative for ischemia. Impression ultimately reports this as a low risk scan.  However, he did ultimately undergo a cardiac cath on 02/27/07 that showed: No significant coronary artery disease by cardiac catheterization. There is left ventricular hypertrophy noted on left ventriculogram. Moderate pulmonary hypertension with preserved cardiac output and cardiac index.  Exam today showed  clear lungs, significant LE edema which he says is WNL, heart RRR without significant murmur noted. No  carotid bruits noted.  Abdomen obese.       Chest x-ray on 07/19/2012 showed no evidence of active cardiopulmonary disease.  Preoperative labs noted.  PLT count 97K which is around his baseline 6 months ago.  WBC 12.5. Cr 0.87.  Glucose 96.  PLT count and WBC results called to Darl Pikes at [facility].  He has a history of CHF and LE edema, but no significant CAD by cath in 2008.  He has chronic DOE, but no SOB at rest.  His weight is overall stable since October 2013.  His EF and RSVP were WNL in 2010.  He does not appear to be in acute CHF presently.  He has been evaluated by his cardiologist within the past six month.  Other then increased back and leg pain, Mr. Parenteau reports he feels at his baseline. He will be evaluated by his assigned anesthesiologist on the day of surgery, but if no acute changes then would anticipate he could proceed as planned.  He will need close post-operative monitoring initially due to his OSA, morbid obesity, and CHF history.  Velna Ochs [facility]/Anesthesiology Phone [phone] 08/21/2012 4:37 PM

## 2012-08-21 NOTE — Progress Notes (Signed)
Requested cardiology records from Dr. Rennis Golden in [place] to include last OV, EKG, Stress and Echo (if ava.). Spoke with Revonda Standard, [place] regarding pt's history and requested consult.

## 2012-08-21 NOTE — Pre-Procedure Instructions (Signed)
[name] [name]  08/21/2012   Your procedure is scheduled on:  April 2  Report to [facility] at 11:00 AM.  Call this number if you have problems the morning of surgery: [phone]   Remember:   Do not eat food or drink liquids after midnight.   Take these medicines the morning of surgery with A SIP OF WATER:  acetaminophen (TYLENOL) 325 MG tablet Take 650 mg by mouth every 6 (six) hours as needed for pain.   cetirizine (ZYRTEC) 10 MG tablet Take 10 mg by mouth daily.   Dorzolamide HCl-Timolol Mal PF 22.3-6.8 MG/ML SOLN Apply 1 drop to eye 2 (two) times daily. Dorzolamide HCI-Timolol Maleate Ophthalmic Solution 2.23%/0.68%   doxycycline (VIBRAMYCIN) 100 MG capsule Take 1 capsule (100 mg total) by mouth 2 (two) times daily.   latanoprost (XALATAN) 0.005 % ophthalmic solution Place 1 drop into both eyes at bedtime.   metoprolol succinate (TOPROL-XL) 25 MG 24 hr tablet Take 12.5 mg by mouth every morning.    ondansetron (ZOFRAN) 4 MG tablet Take 1 tablet (4 mg total) by mouth every 8 (eight) hours as needed. For nausea   oxyCODONE-acetaminophen (PERCOCET) 10-325 MG per tablet Take 1 tablet by mouth every 6 (six) hours as needed for pain.    pantoprazole (PROTONIX) 40 MG tablet Take 40 mg by mouth 2 (two) times daily.   Polyethyl Glycol-Propyl Glycol (LUBRICANT EYE DROPS) 0.4-0.3 % SOLN Apply 1 drop to eye daily as needed (for lubricant eye drops).     Do not wear jewelry, make-up or nail polish.  Do not wear lotions, powders, or perfumes. You may wear deodorant.  Do not shave 48 hours prior to surgery. Men may shave face and neck.  Do not bring valuables to the hospital.  Contacts, dentures or bridgework may not be worn into surgery.  Leave suitcase in the car. After surgery it may be brought to your room.  For patients admitted to the hospital, checkout time is 11:00 AM the day of discharge.   Special Instructions: Shower using CHG 2 nights before surgery and the  night before surgery.  If you shower the day of surgery use CHG.  Use special wash - you have one bottle of CHG for all showers.  You should use approximately 1/3 of the bottle for each shower.   Please read over the following fact sheets that you were given: Pain Booklet, Coughing and Deep Breathing, Blood Transfusion Information and Surgical Site Infection Prevention

## 2012-08-22 ENCOUNTER — Inpatient Hospital Stay (HOSPITAL_COMMUNITY): Payer: Medicare Other

## 2012-08-22 ENCOUNTER — Encounter (HOSPITAL_COMMUNITY): Payer: Self-pay | Admitting: Vascular Surgery

## 2012-08-22 ENCOUNTER — Encounter: Payer: Self-pay | Admitting: Oncology

## 2012-08-22 ENCOUNTER — Inpatient Hospital Stay (HOSPITAL_COMMUNITY): Payer: Medicare Other | Admitting: Anesthesiology

## 2012-08-22 ENCOUNTER — Encounter (HOSPITAL_COMMUNITY): Payer: Self-pay | Admitting: *Deleted

## 2012-08-22 ENCOUNTER — Inpatient Hospital Stay (HOSPITAL_COMMUNITY)
Admission: RE | Admit: 2012-08-22 | Discharge: 2012-09-06 | DRG: 460 | Disposition: A | Discharge status: Skilled Nursing Facility | Payer: Medicare Other | Source: Ambulatory Visit | Attending: Neurosurgery | Admitting: Neurosurgery

## 2012-08-22 ENCOUNTER — Encounter (HOSPITAL_COMMUNITY)
Admission: RE | Disposition: A | Discharge status: Skilled Nursing Facility | Payer: Self-pay | Source: Ambulatory Visit | Attending: Neurosurgery

## 2012-08-22 DIAGNOSIS — E7800 Pure hypercholesterolemia, unspecified: Secondary | ICD-10-CM | POA: Diagnosis present

## 2012-08-22 DIAGNOSIS — I10 Essential (primary) hypertension: Secondary | ICD-10-CM | POA: Diagnosis not present

## 2012-08-22 DIAGNOSIS — N400 Enlarged prostate without lower urinary tract symptoms: Secondary | ICD-10-CM | POA: Diagnosis present

## 2012-08-22 DIAGNOSIS — K560 Paralytic ileus: Secondary | ICD-10-CM | POA: Diagnosis not present

## 2012-08-22 DIAGNOSIS — M47817 Spondylosis without myelopathy or radiculopathy, lumbosacral region: Secondary | ICD-10-CM | POA: Diagnosis not present

## 2012-08-22 DIAGNOSIS — Z87891 Personal history of nicotine dependence: Secondary | ICD-10-CM

## 2012-08-22 DIAGNOSIS — M5126 Other intervertebral disc displacement, lumbar region: Secondary | ICD-10-CM | POA: Diagnosis not present

## 2012-08-22 DIAGNOSIS — M48061 Spinal stenosis, lumbar region without neurogenic claudication: Secondary | ICD-10-CM | POA: Diagnosis not present

## 2012-08-22 DIAGNOSIS — E785 Hyperlipidemia, unspecified: Secondary | ICD-10-CM | POA: Diagnosis present

## 2012-08-22 DIAGNOSIS — M533 Sacrococcygeal disorders, not elsewhere classified: Secondary | ICD-10-CM | POA: Diagnosis not present

## 2012-08-22 DIAGNOSIS — M4726 Other spondylosis with radiculopathy, lumbar region: Secondary | ICD-10-CM | POA: Diagnosis present

## 2012-08-22 DIAGNOSIS — G4730 Sleep apnea, unspecified: Secondary | ICD-10-CM | POA: Diagnosis present

## 2012-08-22 DIAGNOSIS — R142 Eructation: Secondary | ICD-10-CM | POA: Diagnosis not present

## 2012-08-22 DIAGNOSIS — Z01812 Encounter for preprocedural laboratory examination: Secondary | ICD-10-CM

## 2012-08-22 DIAGNOSIS — F411 Generalized anxiety disorder: Secondary | ICD-10-CM | POA: Diagnosis present

## 2012-08-22 DIAGNOSIS — R141 Gas pain: Secondary | ICD-10-CM | POA: Diagnosis not present

## 2012-08-22 DIAGNOSIS — M1990 Unspecified osteoarthritis, unspecified site: Secondary | ICD-10-CM | POA: Diagnosis present

## 2012-08-22 DIAGNOSIS — K219 Gastro-esophageal reflux disease without esophagitis: Secondary | ICD-10-CM | POA: Diagnosis present

## 2012-08-22 DIAGNOSIS — E039 Hypothyroidism, unspecified: Secondary | ICD-10-CM | POA: Diagnosis present

## 2012-08-22 SURGERY — POSTERIOR LUMBAR FUSION 1 LEVEL
Anesthesia: General | Site: Back | Wound class: Clean

## 2012-08-22 MED ORDER — LACTATED RINGERS IV SOLN
INTRAVENOUS | Status: DC | PRN
  Administered 2012-08-22 (×3): via INTRAVENOUS

## 2012-08-22 MED ORDER — DEXAMETHASONE SODIUM PHOSPHATE 4 MG/ML IJ SOLN: INTRAMUSCULAR | Status: DC | PRN

## 2012-08-22 MED ORDER — ATORVASTATIN CALCIUM 80 MG PO TABS
80.0000 mg | ORAL_TABLET | Freq: Every day | ORAL | Status: DC
  Administered 2012-08-22 – 2012-09-01 (×9): 80 mg via ORAL
  Filled 2012-08-22 (×12): qty 1

## 2012-08-22 MED ORDER — POLYVINYL ALCOHOL 1.4 % OP SOLN
1.0000 [drp] | Freq: Every day | OPHTHALMIC | Status: DC
  Administered 2012-08-24 – 2012-09-04 (×6): 1 [drp] via OPHTHALMIC
  Filled 2012-08-22 (×3): qty 15

## 2012-08-22 MED ORDER — SENNA 8.6 MG PO TABS
1.0000 | ORAL_TABLET | Freq: Two times a day (BID) | ORAL | Status: DC
  Administered 2012-08-22 – 2012-09-06 (×17): 8.6 mg via ORAL
  Filled 2012-08-22 (×31): qty 1

## 2012-08-22 MED ORDER — DIAZEPAM 5 MG PO TABS
5.0000 mg | ORAL_TABLET | Freq: Four times a day (QID) | ORAL | Status: DC | PRN
  Administered 2012-08-24 – 2012-09-06 (×4): 5 mg via ORAL
  Filled 2012-08-22 (×4): qty 1

## 2012-08-22 MED ORDER — TAMSULOSIN HCL 0.4 MG PO CAPS
0.4000 mg | ORAL_CAPSULE | Freq: Every day | ORAL | Status: DC
  Administered 2012-08-23 – 2012-09-05 (×12): 0.4 mg via ORAL
  Filled 2012-08-22 (×15): qty 1

## 2012-08-22 MED ORDER — ACETAMINOPHEN 10 MG/ML IV SOLN
1000.0000 mg | Freq: Once | INTRAVENOUS | Status: AC
  Administered 2012-08-22: 1000 mg via INTRAVENOUS
  Filled 2012-08-22: qty 100

## 2012-08-22 MED ORDER — FENTANYL CITRATE 0.05 MG/ML IJ SOLN
INTRAMUSCULAR | Status: DC | PRN
  Administered 2012-08-22 (×4): 50 ug via INTRAVENOUS
  Administered 2012-08-22: 100 ug via INTRAVENOUS

## 2012-08-22 MED ORDER — ACETAMINOPHEN 10 MG/ML IV SOLN
INTRAVENOUS | Status: AC
  Administered 2012-08-22: 1000 mg via INTRAVENOUS
  Filled 2012-08-22: qty 100

## 2012-08-22 MED ORDER — ONDANSETRON HCL 4 MG/2ML IJ SOLN
INTRAMUSCULAR | Status: DC | PRN
  Administered 2012-08-22: 4 mg via INTRAVENOUS

## 2012-08-22 MED ORDER — SODIUM CHLORIDE 0.9 % IJ SOLN: 9.0000 mL | INTRAMUSCULAR | Status: DC | PRN

## 2012-08-22 MED ORDER — DEXTROSE 5 % IV SOLN
3.0000 g | Freq: Three times a day (TID) | INTRAVENOUS | Status: AC
  Administered 2012-08-22 – 2012-08-23 (×2): 3 g via INTRAVENOUS
  Filled 2012-08-22 (×2): qty 3000

## 2012-08-22 MED ORDER — VECURONIUM BROMIDE 10 MG IV SOLR
INTRAVENOUS | Status: DC | PRN
  Administered 2012-08-22 (×3): 2 mg via INTRAVENOUS
  Administered 2012-08-22: 1 mg via INTRAVENOUS

## 2012-08-22 MED ORDER — ACETAMINOPHEN 10 MG/ML IV SOLN
1000.0000 mg | Freq: Four times a day (QID) | INTRAVENOUS | Status: AC
  Administered 2012-08-23 (×4): 1000 mg via INTRAVENOUS
  Filled 2012-08-22 (×5): qty 100

## 2012-08-22 MED ORDER — METOPROLOL SUCCINATE 12.5 MG HALF TABLET
12.5000 mg | ORAL_TABLET | Freq: Every morning | ORAL | Status: DC
  Administered 2012-08-23 – 2012-09-06 (×14): 12.5 mg via ORAL
  Filled 2012-08-22 (×15): qty 1

## 2012-08-22 MED ORDER — LIDOCAINE HCL (CARDIAC) 20 MG/ML IV SOLN
INTRAVENOUS | Status: DC | PRN
  Administered 2012-08-22: 100 mg via INTRAVENOUS

## 2012-08-22 MED ORDER — HYDROMORPHONE HCL PF 1 MG/ML IJ SOLN
INTRAMUSCULAR | Status: AC
  Filled 2012-08-22: qty 1

## 2012-08-22 MED ORDER — NEOSTIGMINE METHYLSULFATE 1 MG/ML IJ SOLN
INTRAMUSCULAR | Status: DC | PRN
  Administered 2012-08-22: 4 mg via INTRAVENOUS

## 2012-08-22 MED ORDER — MENTHOL 3 MG MT LOZG
1.0000 | LOZENGE | OROMUCOSAL | Status: DC | PRN
  Filled 2012-08-22: qty 9

## 2012-08-22 MED ORDER — LORATADINE 10 MG PO TABS
10.0000 mg | ORAL_TABLET | Freq: Every day | ORAL | Status: DC
  Administered 2012-08-22 – 2012-09-06 (×13): 10 mg via ORAL
  Filled 2012-08-22 (×16): qty 1

## 2012-08-22 MED ORDER — POLYETHYL GLYCOL-PROPYL GLYCOL 0.4-0.3 % OP SOLN: 1.0000 [drp] | Freq: Every day | OPHTHALMIC | Status: DC | PRN

## 2012-08-22 MED ORDER — LATANOPROST 0.005 % OP SOLN
1.0000 [drp] | Freq: Every day | OPHTHALMIC | Status: DC
  Administered 2012-08-22 – 2012-09-05 (×13): 1 [drp] via OPHTHALMIC
  Filled 2012-08-22 (×3): qty 2.5

## 2012-08-22 MED ORDER — POTASSIUM CHLORIDE IN NACL 20-0.9 MEQ/L-% IV SOLN
INTRAVENOUS | Status: DC
  Administered 2012-08-22: 22:00:00 via INTRAVENOUS
  Administered 2012-08-23: 80 mL/h via INTRAVENOUS
  Administered 2012-08-27 – 2012-09-01 (×6): via INTRAVENOUS
  Administered 2012-09-03: 80 mL/h via INTRAVENOUS
  Administered 2012-09-04 – 2012-09-06 (×4): via INTRAVENOUS
  Filled 2012-08-22 (×36): qty 1000

## 2012-08-22 MED ORDER — THROMBIN 20000 UNITS EX SOLR
CUTANEOUS | Status: DC | PRN
  Administered 2012-08-22: 15:00:00 via TOPICAL

## 2012-08-22 MED ORDER — PHENOL 1.4 % MT LIQD: 1.0000 | OROMUCOSAL | Status: DC | PRN

## 2012-08-22 MED ORDER — SODIUM CHLORIDE 0.9 % IV SOLN: 250.0000 mL | INTRAVENOUS | Status: DC

## 2012-08-22 MED ORDER — SODIUM CHLORIDE 0.9 % IJ SOLN: 3.0000 mL | INTRAMUSCULAR | Status: DC | PRN

## 2012-08-22 MED ORDER — DIPHENHYDRAMINE HCL 50 MG/ML IJ SOLN: 12.5000 mg | Freq: Four times a day (QID) | INTRAMUSCULAR | Status: DC | PRN

## 2012-08-22 MED ORDER — GLYCOPYRROLATE 0.2 MG/ML IJ SOLN
INTRAMUSCULAR | Status: DC | PRN
  Administered 2012-08-22: 0.3 mg via INTRAVENOUS
  Administered 2012-08-22: .5 mg via INTRAVENOUS

## 2012-08-22 MED ORDER — HYDROMORPHONE 0.3 MG/ML IV SOLN
INTRAVENOUS | Status: AC
  Administered 2012-08-22: 0.9 mg
  Filled 2012-08-22: qty 25

## 2012-08-22 MED ORDER — FUROSEMIDE 80 MG PO TABS
80.0000 mg | ORAL_TABLET | Freq: Two times a day (BID) | ORAL | Status: DC
  Administered 2012-08-23 – 2012-09-06 (×28): 80 mg via ORAL
  Filled 2012-08-22 (×31): qty 1

## 2012-08-22 MED ORDER — PANTOPRAZOLE SODIUM 40 MG PO TBEC
40.0000 mg | DELAYED_RELEASE_TABLET | Freq: Two times a day (BID) | ORAL | Status: DC
  Administered 2012-08-22 – 2012-09-02 (×19): 40 mg via ORAL
  Filled 2012-08-22 (×18): qty 1

## 2012-08-22 MED ORDER — LABETALOL HCL 5 MG/ML IV SOLN
INTRAVENOUS | Status: DC | PRN
  Administered 2012-08-22 (×2): 5 mg via INTRAVENOUS
  Administered 2012-08-22: 10 mg via INTRAVENOUS

## 2012-08-22 MED ORDER — HYDROMORPHONE 0.3 MG/ML IV SOLN
INTRAVENOUS | Status: DC
  Administered 2012-08-22: 21:00:00 via INTRAVENOUS
  Administered 2012-08-23: 1.5 mg via INTRAVENOUS
  Administered 2012-08-23: 2.1 mg via INTRAVENOUS
  Administered 2012-08-23: 14:00:00 via INTRAVENOUS
  Administered 2012-08-23: 3.6 mg via INTRAVENOUS
  Administered 2012-08-23: 0.9 mg via INTRAVENOUS
  Administered 2012-08-23: 3.3 mg via INTRAVENOUS
  Administered 2012-08-23: 3.9 mg via INTRAVENOUS
  Administered 2012-08-23: 2.26 mg via INTRAVENOUS
  Administered 2012-08-24: 0.9 mg via INTRAVENOUS
  Administered 2012-08-24: 16:00:00 via INTRAVENOUS
  Administered 2012-08-24: 0.868 mg via INTRAVENOUS
  Administered 2012-08-24: 2.1 mg via INTRAVENOUS
  Administered 2012-08-25: 0.6 mg via INTRAVENOUS
  Administered 2012-08-25: 3.3 mg via INTRAVENOUS
  Administered 2012-08-25: 1.8 mg via INTRAVENOUS
  Administered 2012-08-25: 0.9 mg via INTRAVENOUS
  Administered 2012-08-25: 2.1 mg via INTRAVENOUS
  Administered 2012-08-25: 1.2 mg via INTRAVENOUS
  Administered 2012-08-25: 1.8 mg via INTRAVENOUS
  Administered 2012-08-26: 0.9 mg via INTRAVENOUS
  Administered 2012-08-26: 0.895 mg via INTRAVENOUS
  Administered 2012-08-26: 0.6 mg via INTRAVENOUS
  Filled 2012-08-22 (×4): qty 25

## 2012-08-22 MED ORDER — MIDAZOLAM HCL 2 MG/2ML IJ SOLN: 1.0000 mg | INTRAMUSCULAR | Status: DC | PRN

## 2012-08-22 MED ORDER — HYDROMORPHONE HCL PF 1 MG/ML IJ SOLN
0.2500 mg | INTRAMUSCULAR | Status: DC | PRN
  Administered 2012-08-22 (×5): 0.5 mg via INTRAVENOUS

## 2012-08-22 MED ORDER — DOCUSATE SODIUM 100 MG PO CAPS
100.0000 mg | ORAL_CAPSULE | Freq: Every day | ORAL | Status: DC
  Administered 2012-08-22 – 2012-08-31 (×6): 100 mg via ORAL
  Filled 2012-08-22 (×7): qty 1

## 2012-08-22 MED ORDER — PHENYLEPHRINE HCL 10 MG/ML IJ SOLN
INTRAMUSCULAR | Status: DC | PRN
  Administered 2012-08-22 (×2): 40 ug via INTRAVENOUS

## 2012-08-22 MED ORDER — ROCURONIUM BROMIDE 100 MG/10ML IV SOLN
INTRAVENOUS | Status: DC | PRN
  Administered 2012-08-22 (×2): 50 mg via INTRAVENOUS

## 2012-08-22 MED ORDER — EPHEDRINE SULFATE 50 MG/ML IJ SOLN
INTRAMUSCULAR | Status: DC | PRN
  Administered 2012-08-22: 10 mg via INTRAVENOUS

## 2012-08-22 MED ORDER — POTASSIUM CHLORIDE CRYS ER 20 MEQ PO TBCR
20.0000 meq | EXTENDED_RELEASE_TABLET | Freq: Two times a day (BID) | ORAL | Status: DC
  Administered 2012-08-22 – 2012-09-06 (×24): 20 meq via ORAL
  Filled 2012-08-22 (×31): qty 1

## 2012-08-22 MED ORDER — LATANOPROST 0.005 % OP SOLN: 1.0000 [drp] | Freq: Every day | OPHTHALMIC | Status: DC

## 2012-08-22 MED ORDER — PROMETHAZINE HCL 25 MG/ML IJ SOLN: 6.2500 mg | INTRAMUSCULAR | Status: DC | PRN

## 2012-08-22 MED ORDER — ONDANSETRON HCL 4 MG/2ML IJ SOLN: 4.0000 mg | Freq: Four times a day (QID) | INTRAMUSCULAR | Status: DC | PRN

## 2012-08-22 MED ORDER — MORPHINE SULFATE 2 MG/ML IJ SOLN
1.0000 mg | INTRAMUSCULAR | Status: DC | PRN
Start: 2012-08-22 — End: 2012-09-02
  Administered 2012-08-26 – 2012-08-27 (×2): 2 mg via INTRAVENOUS
  Administered 2012-08-27 (×2): 4 mg via INTRAVENOUS
  Administered 2012-08-28 – 2012-08-31 (×11): 2 mg via INTRAVENOUS
  Administered 2012-08-31: 4 mg via INTRAVENOUS
  Administered 2012-08-31 (×2): 2 mg via INTRAVENOUS
  Administered 2012-09-01: 4 mg via INTRAVENOUS
  Administered 2012-09-01 (×2): 2 mg via INTRAVENOUS
  Administered 2012-09-02 (×2): 4 mg via INTRAVENOUS
  Filled 2012-08-22 (×2): qty 1
  Filled 2012-08-22: qty 2
  Filled 2012-08-22 (×5): qty 1
  Filled 2012-08-22 (×3): qty 2
  Filled 2012-08-22 (×3): qty 1
  Filled 2012-08-22: qty 2
  Filled 2012-08-22 (×2): qty 1
  Filled 2012-08-22: qty 2
  Filled 2012-08-22 (×6): qty 1

## 2012-08-22 MED ORDER — PROPOFOL 10 MG/ML IV BOLUS
INTRAVENOUS | Status: DC | PRN
  Administered 2012-08-22: 200 mg via INTRAVENOUS

## 2012-08-22 MED ORDER — MIDAZOLAM HCL 5 MG/5ML IJ SOLN
INTRAMUSCULAR | Status: DC | PRN
  Administered 2012-08-22 (×2): 1 mg via INTRAVENOUS

## 2012-08-22 MED ORDER — ARTIFICIAL TEARS OP OINT
TOPICAL_OINTMENT | OPHTHALMIC | Status: DC | PRN
  Administered 2012-08-22: 1 via OPHTHALMIC

## 2012-08-22 MED ORDER — DIPHENHYDRAMINE HCL 12.5 MG/5ML PO ELIX
12.5000 mg | ORAL_SOLUTION | Freq: Four times a day (QID) | ORAL | Status: DC | PRN
  Filled 2012-08-22: qty 5

## 2012-08-22 MED ORDER — LIDOCAINE HCL 4 % MT SOLN
OROMUCOSAL | Status: DC | PRN
  Administered 2012-08-22: 4 mL via TOPICAL

## 2012-08-22 MED ORDER — NALOXONE HCL 0.4 MG/ML IJ SOLN: 0.4000 mg | INTRAMUSCULAR | Status: DC | PRN

## 2012-08-22 MED ORDER — DORZOLAMIDE HCL-TIMOLOL MAL PF 22.3-6.8 MG/ML OP SOLN: 1.0000 [drp] | Freq: Two times a day (BID) | OPHTHALMIC | Status: DC

## 2012-08-22 MED ORDER — FENTANYL CITRATE 0.05 MG/ML IJ SOLN: 50.0000 ug | Freq: Once | INTRAMUSCULAR | Status: DC

## 2012-08-22 MED ORDER — CEFAZOLIN SODIUM 1-5 GM-% IV SOLN
INTRAVENOUS | Status: AC
  Administered 2012-08-22: 1 g via INTRAVENOUS
  Administered 2012-08-22: 2 g via INTRAVENOUS
  Filled 2012-08-22: qty 50

## 2012-08-22 MED ORDER — 0.9 % SODIUM CHLORIDE (POUR BTL) OPTIME
TOPICAL | Status: DC | PRN
  Administered 2012-08-22 (×2): 1000 mL

## 2012-08-22 MED ORDER — SODIUM CHLORIDE 0.9 % IJ SOLN
3.0000 mL | Freq: Two times a day (BID) | INTRAMUSCULAR | Status: DC
  Administered 2012-08-22 – 2012-09-05 (×9): 3 mL via INTRAVENOUS

## 2012-08-22 MED ORDER — CEFAZOLIN SODIUM-DEXTROSE 2-3 GM-% IV SOLR
INTRAVENOUS | Status: AC
  Filled 2012-08-22: qty 50

## 2012-08-22 MED ORDER — DOXYCYCLINE HYCLATE 100 MG PO CAPS
100.0000 mg | ORAL_CAPSULE | Freq: Two times a day (BID) | ORAL | Status: DC
  Administered 2012-08-22 – 2012-09-06 (×27): 100 mg via ORAL
  Filled 2012-08-22 (×31): qty 1

## 2012-08-22 MED ORDER — DORZOLAMIDE HCL-TIMOLOL MAL 2-0.5 % OP SOLN
1.0000 [drp] | Freq: Two times a day (BID) | OPHTHALMIC | Status: DC
  Administered 2012-08-22 – 2012-09-03 (×10): 1 [drp] via OPHTHALMIC
  Filled 2012-08-22 (×2): qty 10

## 2012-08-22 MED ORDER — DEXAMETHASONE SODIUM PHOSPHATE 10 MG/ML IJ SOLN
INTRAMUSCULAR | Status: AC
  Administered 2012-08-22: 10 mg via INTRAVENOUS
  Filled 2012-08-22: qty 1

## 2012-08-22 MED ORDER — OXYCODONE HCL 5 MG PO TABS
5.0000 mg | ORAL_TABLET | ORAL | Status: DC | PRN
  Administered 2012-08-23 – 2012-08-31 (×7): 10 mg via ORAL
  Filled 2012-08-22 (×8): qty 2

## 2012-08-22 MED ORDER — ONDANSETRON HCL 4 MG/2ML IJ SOLN
4.0000 mg | INTRAMUSCULAR | Status: DC | PRN
  Administered 2012-08-23 – 2012-09-04 (×34): 4 mg via INTRAVENOUS
  Filled 2012-08-22 (×36): qty 2

## 2012-08-22 SURGICAL SUPPLY — 72 items
14x23x4 degree Mas PLIF Interbody ×2 IMPLANT
ADH SKN CLS APL DERMABOND .7 (GAUZE/BANDAGES/DRESSINGS) ×1
APL SKNCLS STERI-STRIP NONHPOA (GAUZE/BANDAGES/DRESSINGS)
BENZOIN TINCTURE PRP APPL 2/3 (GAUZE/BANDAGES/DRESSINGS) IMPLANT
BLADE SURG ROTATE 9660 (MISCELLANEOUS) IMPLANT
BONE MATRIX OSTEOCEL PLUS 5CC (Bone Implant) ×1 IMPLANT
BUR MATCHSTICK NEURO 3.0 LAGG (BURR) ×2 IMPLANT
CAGE MAS PLIF 14X23X4 (Cage) ×2 IMPLANT
CANISTER SUCTION 2500CC (MISCELLANEOUS) ×2 IMPLANT
CATH FOLEY 2WAY SLVR  5CC 12FR (CATHETERS) ×1
CATH FOLEY 2WAY SLVR 5CC 12FR (CATHETERS) IMPLANT
CLOTH BEACON ORANGE TIMEOUT ST (SAFETY) ×2 IMPLANT
CONT SPEC 4OZ CLIKSEAL STRL BL (MISCELLANEOUS) ×2 IMPLANT
COVER BACK TABLE 24X17X13 BIG (DRAPES) IMPLANT
DECANTER SPIKE VIAL GLASS SM (MISCELLANEOUS) ×2 IMPLANT
DERMABOND ADVANCED (GAUZE/BANDAGES/DRESSINGS) ×1
DERMABOND ADVANCED .7 DNX12 (GAUZE/BANDAGES/DRESSINGS) ×1 IMPLANT
DRAPE C-ARM 42X72 X-RAY (DRAPES) ×5 IMPLANT
DRAPE LAPAROTOMY 100X72X124 (DRAPES) ×2 IMPLANT
DRAPE POUCH INSTRU U-SHP 10X18 (DRAPES) ×2 IMPLANT
DRAPE SURG 17X23 STRL (DRAPES) ×2 IMPLANT
DRESSING TELFA 8X3 (GAUZE/BANDAGES/DRESSINGS) IMPLANT
DURAPREP 26ML APPLICATOR (WOUND CARE) ×2 IMPLANT
ELECT BLADE 4.0 EZ CLEAN MEGAD (MISCELLANEOUS) ×2
ELECT REM PT RETURN 9FT ADLT (ELECTROSURGICAL) ×2
ELECTRODE BLDE 4.0 EZ CLN MEGD (MISCELLANEOUS) IMPLANT
ELECTRODE REM PT RTRN 9FT ADLT (ELECTROSURGICAL) ×1 IMPLANT
GAUZE SPONGE 4X4 16PLY XRAY LF (GAUZE/BANDAGES/DRESSINGS) IMPLANT
GLOVE BIOGEL PI IND STRL 6.5 (GLOVE) IMPLANT
GLOVE BIOGEL PI IND STRL 8.5 (GLOVE) IMPLANT
GLOVE BIOGEL PI INDICATOR 6.5 (GLOVE) ×1
GLOVE BIOGEL PI INDICATOR 8.5 (GLOVE) ×2
GLOVE ECLIPSE 6.5 STRL STRAW (GLOVE) ×4 IMPLANT
GLOVE ECLIPSE 7.5 STRL STRAW (GLOVE) ×2 IMPLANT
GLOVE EXAM NITRILE LRG STRL (GLOVE) IMPLANT
GLOVE EXAM NITRILE MD LF STRL (GLOVE) IMPLANT
GLOVE EXAM NITRILE XL STR (GLOVE) IMPLANT
GLOVE EXAM NITRILE XS STR PU (GLOVE) IMPLANT
GLOVE INDICATOR 7.5 STRL GRN (GLOVE) ×4 IMPLANT
GLOVE SURG SS PI 8.0 STRL IVOR (GLOVE) ×2 IMPLANT
GOWN BRE IMP SLV AUR LG STRL (GOWN DISPOSABLE) ×4 IMPLANT
GOWN BRE IMP SLV AUR XL STRL (GOWN DISPOSABLE) IMPLANT
GOWN STRL REIN 2XL LVL4 (GOWN DISPOSABLE) ×1 IMPLANT
KIT BASIN OR (CUSTOM PROCEDURE TRAY) ×2 IMPLANT
KIT POSITION SURG JACKSON T1 (MISCELLANEOUS) ×2 IMPLANT
KIT ROOM TURNOVER OR (KITS) ×2 IMPLANT
NDL HYPO 25X1 1.5 SAFETY (NEEDLE) ×1 IMPLANT
NDL SPNL 18GX3.5 QUINCKE PK (NEEDLE) IMPLANT
NEEDLE HYPO 25X1 1.5 SAFETY (NEEDLE) ×2 IMPLANT
NEEDLE SPNL 18GX3.5 QUINCKE PK (NEEDLE) ×2 IMPLANT
NS IRRIG 1000ML POUR BTL (IV SOLUTION) ×2 IMPLANT
PACK LAMINECTOMY NEURO (CUSTOM PROCEDURE TRAY) ×2 IMPLANT
PAD ARMBOARD 7.5X6 YLW CONV (MISCELLANEOUS) ×4 IMPLANT
ROD PREBENT 45MM LUMBAR (Rod) ×4 IMPLANT
SCREW LOCK (Screw) ×8 IMPLANT
SCREW LOCK FXNS SPNE MAS PL (Screw) IMPLANT
SCREW SHANKS 5.5X35 (Screw) ×4 IMPLANT
SCREW TULIP 5.5 (Screw) ×4 IMPLANT
SPONGE GAUZE 4X4 12PLY (GAUZE/BANDAGES/DRESSINGS) IMPLANT
SPONGE LAP 4X18 X RAY DECT (DISPOSABLE) IMPLANT
SPONGE SURGIFOAM ABS GEL 100 (HEMOSTASIS) ×2 IMPLANT
STRIP CLOSURE SKIN 1/2X4 (GAUZE/BANDAGES/DRESSINGS) IMPLANT
SUT PROLENE 6 0 BV (SUTURE) IMPLANT
SUT VIC AB 0 CT1 18XCR BRD8 (SUTURE) ×1 IMPLANT
SUT VIC AB 0 CT1 8-18 (SUTURE) ×4
SUT VIC AB 2-0 CT1 18 (SUTURE) ×2 IMPLANT
SUT VIC AB 3-0 SH 8-18 (SUTURE) ×2 IMPLANT
SYR 20ML ECCENTRIC (SYRINGE) ×2 IMPLANT
TOWEL OR 17X24 6PK STRL BLUE (TOWEL DISPOSABLE) ×2 IMPLANT
TOWEL OR 17X26 10 PK STRL BLUE (TOWEL DISPOSABLE) ×2 IMPLANT
TRAY FOLEY CATH 14FRSI W/METER (CATHETERS) ×1 IMPLANT
WATER STERILE IRR 1000ML POUR (IV SOLUTION) ×2 IMPLANT

## 2012-08-22 NOTE — Op Note (Signed)
08/22/2012  6:39 PM  PATIENT:  [name] [name]  72 y.o. male with lumbar spondylosis and lumbar stenosis. Over the years his problems have not improved despite therapy, injections, and most importantly time  PRE-OPERATIVE DIAGNOSIS:  lumbar stenosis lumbar herniated disc  POST-OPERATIVE DIAGNOSIS:  lumbar stenosis lumbar herniated disc  PROCEDURE:  Procedure(s): POSTERIOR LUMBAR FUSION 1 LEVEL L4/5, Peek interbody cages 14x83mm x2 Posterolateral arthrodesis L4/5 Non segmental pedicle screw fixation L4/5 Nuvasive  SURGEON:  Surgeon(s): Carmela Hurt, MD Hewitt Shorts, MD  ASSISTANTS:Nudelman  ANESTHESIA:   general  EBL:  Total I/O In: 2000 [I.V.:2000] Out: 750 [Urine:400; Blood:350]  BLOOD ADMINISTERED:none  CELL SAVER GIVEN:none  COUNT:per nursing  DRAINS: none   SPECIMEN:  No Specimen  DICTATION: [name] [name] was brought to the operating room, intubated, and placed under a general anesthetic without difficulty. He had a foley catheter placed under sterile conditions. He was then positioned prone on a Jackson table with all pressure points properly padded. His back was prepped and draped in a sterile fashion. I infiltrated lidocaine into the planned incision. I opened the skin with a 10 blade and exposed in a subperiosteal fashion the lamina of L3,4,and 5 bilaterally. I confirmed my location with intraoperative xray.  With fluoroscopic guidance I placed 4 pedicle screws using a medial to lateral trajectory through the pedicle. I first drilled a starting point and checked on lateral and ap fluoroscopy my trajectory. I then used the drill to create a pilot hole, then the hand drill to traverse the pedicle under fluoroscopic guidance. I then inspected the hole with a pedicle probe and all the final screw positions were intact surrounded by bone. After the inspection I used a tap to prepare the drilled holes for the screws. I placed the screws without difficulty again with  fluoroscopic guidance.  I now moved to the posterior lumbar interbody fusion. I, with the drill, and kerrison punches performed a laminectomy of L4 and inferior facetectomies of L4 to fully decompress the spinal canal and the L4 roots. The  decompression of the L4 roots in the lateral recesses  was well beyond what was necessary for the interbody cage placement which is more medial. I opened the disc spaces bilaterally and performed discetomies removing disc, endplate, and cartilage. The disc was degenerated. I used various tools to perform the discetomies, rasps, Kerrison punches, disc space shavers, and disc space spreaders. I was able to prepare the disc space for the cages. I measured the space and placed 14x24mm cages(nuvasive mas plif) on each side with Dr. Earl Gala assistance. We then decorticated the lateral bone and placed bone on these surfaces to complete the posterolateral arthrodesis. Fluoroscopy showed the cages to be in good position.  I connected the pedicle screws by placing the heads and rods and securing them with locking caps. All the caps were tightened with a torque screwdriver. I then irrigated the wound and closed in layers. I approximated the thoracolumbar, subcutaneous, and subcuticular planes with vicryl sutures. I used dermabond for a sterile dressing.   PLAN OF CARE: Admit to inpatient   PATIENT DISPOSITION:  PACU - hemodynamically stable.   Delay start of Pharmacological VTE agent (>24hrs) due to surgical blood loss or risk of bleeding:  yes

## 2012-08-22 NOTE — Transfer of Care (Signed)
Immediate Anesthesia Transfer of Care Note  Patient: [name] [name]  Procedure(s) Performed: Procedure(s) with comments: POSTERIOR LUMBAR FUSION 1 LEVEL (N/A) - Lumbar four-five posterior lumbar interbody fusion with interbody prothesis posterolateral arthrodesis and posterior nonsegmental instrumentation  Patient Location: PACU  Anesthesia Type:General  Level of Consciousness: awake, oriented and patient cooperative  Airway & Oxygen Therapy: Patient Spontanous Breathing and Patient connected to face mask oxygen  Post-op Assessment: Report given to PACU RN, Post -op Vital signs reviewed and stable and Patient moving all extremities X 4  Post vital signs: Reviewed and stable  Complications: No apparent anesthesia complications

## 2012-08-22 NOTE — Anesthesia Preprocedure Evaluation (Addendum)
Anesthesia Evaluation  Patient identified by MRN, date of birth, ID band Patient awake    Reviewed: Allergy & Precautions, H&P , NPO status , Patient's Chart, lab work & pertinent test results, reviewed documented beta blocker date and time   History of Anesthesia Complications Negative for: history of anesthetic complications  Airway Mallampati: II TM Distance: <3 FB Neck ROM: Full    Dental   Pulmonary sleep apnea and Oxygen sleep apnea , former smoker,  Possible hypoventilation syndrome issues after last anesthetic  breath sounds clear to auscultation        Cardiovascular Exercise Tolerance: Poor hypertension, Pt. on medications and Pt. on home beta blockers +CHF (with BLE edema) Rhythm:Regular Rate:Normal  Echo on 10/02/08 showed: Left ventricle: The cavity size was normal. There was moderate concentric hypertrophy. Systolic function was normal. The estimated ejection fraction was in the range of 60% to 65%. Wall motion was normal; there were no regional wall motion abnormalities. Aortic valve: Trileaflet; normal thickness leaflets. Mobility was not restricted. Doppler: Transvalvular velocity was within the normal range. There was no stenosis. No regurgitation. Aorta: Aortic root: The aortic root was normal in size. Mitral valve: Structurally normal valve. Mobility was not restricted. Doppler: Transvalvular velocity was within the normal range. There was no evidence for stenosis. No regurgitation. Peak gradient: 4mm Hg (D). Left atrium: The atrium was normal in size. Right ventricle: The cavity size was normal. Wall thickness was normal. Systolic function was normal. Pulmonic valve: Doppler: Transvalvular velocity was within the normal range. There was no evidence for stenosis. No significant regurgitation. Tricuspid valve: Structurally normal valve. Doppler: Transvalvular velocity was within the normal range. Trivial  regurgitation. Pulmonary artery: The main pulmonary artery was normal-sized. Systolic pressure was within the normal range. Right atrium: The atrium was normal in size. Pericardium: There was no pericardial effusion. Inferior vena cava: The vessel was normal in size.   He had a nuclear stress test on 01/10/07 at [facility] that showed evidence of mild ischemia and mid inferior and mid inferior lateral regions. The post stress EF was 62%. No significant wall motion abnormalities noted. Ventricular function is preserved. EKG was negative for ischemia. Impression ultimately reports this as a low risk scan.  However, he did ultimately undergo a cardiac cath on 02/27/07 that showed: No significant coronary artery disease by cardiac catheterization. There is left ventricular hypertrophy noted on left ventriculogram. Moderate pulmonary hypertension with preserved cardiac output and cardiac index.    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 )    GI/Hepatic Neg liver ROS, PUD, GERD-  Medicated and Controlled,  Endo/Other  Hypothyroidism Morbid obesity  Renal/GU Renal InsufficiencyRenal disease     Musculoskeletal   Abdominal (+) + obese,   Peds  Hematology  (+) Blood dyscrasia (thrombocytopenia), ,   Anesthesia Other Findings   Reproductive/Obstetrics                       Anesthesia Physical Anesthesia Plan  ASA: III  Anesthesia Plan: General   Post-op Pain Management:    Induction: Intravenous  Airway Management Planned: Oral ETT  Additional Equipment:   Intra-op Plan:   Post-operative Plan: Extubation in OR  Informed Consent: I have reviewed the patients History and Physical, chart, labs and discussed the procedure including the risks, benefits and alternatives for the proposed anesthesia with the patient or authorized representative who has indicated his/her understanding and acceptance.     Plan  Discussed with: CRNA and Surgeon  Anesthesia Plan Comments:         Anesthesia Quick Evaluation

## 2012-08-22 NOTE — Preoperative (Signed)
Beta Blockers   Reason not to administer Beta Blockers:Metoprolol 08/22/12 0940

## 2012-08-22 NOTE — Anesthesia Postprocedure Evaluation (Signed)
Anesthesia Post Note  Patient: [name] [name]  Procedure(s) Performed: Procedure(s) (LRB): POSTERIOR LUMBAR FUSION 1 LEVEL (N/A)  Anesthesia type: general  Patient location: PACU  Post pain: Pain level controlled  Post assessment: Patient's Cardiovascular Status Stable  Last Vitals:  Filed Vitals:   08/22/12 1945  BP: 129/77  Pulse:   Temp:   Resp:     Post vital signs: Reviewed and stable  Level of consciousness: sedated  Complications: No apparent anesthesia complications

## 2012-08-22 NOTE — Anesthesia Procedure Notes (Signed)
Procedure Name: Intubation Date/Time: 08/22/2012 1:58 PM Performed by: Leona Singleton A Pre-anesthesia Checklist: Patient identified Patient Re-evaluated:Patient Re-evaluated prior to inductionOxygen Delivery Method: Circle system utilized Preoxygenation: Pre-oxygenation with 100% oxygen Intubation Type: IV induction Ventilation: Mask ventilation without difficulty Laryngoscope Size: Miller and 2 Grade View: Grade I Tube type: Oral Tube size: 7.5 mm Number of attempts: 1 Airway Equipment and Method: Stylet and LTA kit utilized Placement Confirmation: ETT inserted through vocal cords under direct vision,  positive ETCO2 and breath sounds checked- equal and bilateral Secured at: 22 cm Tube secured with: Tape Dental Injury: Teeth and Oropharynx as per pre-operative assessment

## 2012-08-22 NOTE — Progress Notes (Signed)
Patient ID: [name] [name], male   DOB: [birth year], 72 y.o.   MRN: [id number] BP 124/73  Pulse 87  Temp(Src) 98.4 F (36.9 C) (Oral)  Resp 15  Ht 5\' 8"  (1.727 m)  Wt 129.4 kg (285 lb 4.4 oz)  BMI 43.39 kg/m2  SpO2 93% Moving all extremities Wound without signs of infection Continue pca

## 2012-08-22 NOTE — H&P (Signed)
BP 118/80  Pulse 79  Temp(Src) 97.5 F (36.4 C) (Oral)  Resp 18  SpO2 95% [name] [name] is a long time patient of mine whom I have treated for low back pain.  He comes in today because after more epidural shots given by Dr. Nickola Major he is just is too much pain.  He says at this point in time whatever can be done that has a chance to help him it should just be done.  He can not stand nor can he walk with any degree of comfort.  A recent MRI showed increasing stenosis in the lumbar spine.  He has pain that starts in the low back, goes down both legs more so on the right.  It has been on and off for the last two years.  He has weakness in his low back and legs, numbness and tingling in his lower back and legs.  He does have headaches.    [name] [name] is 72 years of age, right-handed, and currently retired.    PAST MEDICAL HISTORY:  Significant for hypertension.   PAST SURGICAL HISTORY:  He underwent a previous lumbar procedure performed by me.    FAMILY HISTORY:    Mother and father are both deceased.  Diabetes and hypertension are present in the family history.    ALLERGIES:    He has a GI intolerance to aspirin.  SOCIAL HISTORY:    He does not smoke, does not use alcohol and does not use illicit drugs.  He is 170 cm. in height and weighs 283 pounds.    REVIEW OF SYSTEMS:   Positive for glaucoma, tinnitus, balance problems, nasal congestion, sinus problems, sinus headaches, sore throat, hypertension, hypercholesterolemia, swelling in the feet, leg pain with walking, shortness of breath, nausea, change in bowel habits, back pain, leg pain, joint pain, and arthritis.  He denies allergies, hematologic, endocrine, psychiatric, neurological, skin, or constitutional problems.    MEDICATIONS:    Atorvastatin 80 mg. q.h.s., Timolol drops, Doxycycline 100 mg. b.i.d., Lasix 80 mg. q.d., Oxycodone 10/325 q.i.d., Xalatan, Metoprolol 25 mg.  q.a.m., Zofran 4 mg. q.8.h. p.r.n., Afrin 2 sprays p.r.n.,  Pantoprazole 40 mg. q.d.,  potassium chloride 20 mEq b.i.d., Tamsulosin 0.4 mg. q.h.s., Probiotic q.a.m., Trolamine salicylate 10% p.r.n.    PHYSICAL EXAMINATION:  On examination today, he has reflexes at the knees 1+ and ankles.  He has great difficulty trying to stand on his toes and is unable to do it.  He has great difficult trying to stand on his heels and is unable to do it.  Manually though again his strength was quite good.  He has normal strength and normal reflexes in the upper extremities.  Speech is clear and fluent. Tongue and uvula are in the midline.  Symmetric facies and symmetric facial sensation.   DIAGNOSTIC STUDIES:   MRI of the lumbar spine is reviewed and what it shows is severe stenosis at L4-5 which has been present now for quite some time.  He has facet arthropathy at this level, lateral recess narrowing and anterolisthesis which is more than likely degenerative in its basis.  The conus is normal.  The cauda equina is normal.  Facet arthropathy is present at L5-S1.   Some mild stenosis at 3-4.    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  I think this can be addressed via a lumbar laminectomy, decompression and subsequent fusion.  Risks and  benefits were explained.  He understands and wishes to proceed.  Risks include bleeding, infection, no relief, need for further surgery, fusion failure, hardware failure, damage to the nerve roots, bowel and bladder dysfunction, and weakness in one or both lower extremities.  I also gave him a very detailed instruction sheet which goes over the risks and benefits and explains the preop and postop expectations that general patients should have.

## 2012-08-23 NOTE — Progress Notes (Signed)
Patient ID: [name] [name], male   DOB: 23-Dec-1940, 72 y.o.   MRN: [id number] BP 99/58  Pulse 80  Temp(Src) 98.2 F (36.8 C) (Oral)  Resp 18  Ht 5\' 8"  (1.727 m)  Wt 129.4 kg (285 lb 4.4 oz)  BMI 43.39 kg/m2  SpO2 92% Alert and oriented x 4 5/5 strength in the lower extremities. Wound is ecchymotic, some drainage from the superior aspect of the wound.  Doing well. Will transfer tomorrow.

## 2012-08-23 NOTE — Progress Notes (Signed)
UR COMPLETED  

## 2012-08-23 NOTE — Evaluation (Signed)
Occupational Therapy Evaluation Patient Details Name: [name] [name] MRN: [id number] DOB: 06/08/1940 Today's Date: 08/23/2012 Time: 4098-1191 OT Time Calculation (min): 25 min  OT Assessment / Plan / Recommendation Clinical Impression  Pt s/p posterior lumbar fusion thus affecting PLOF. Will benefit from acute OT services to address below problem list in prep for return home.    OT Assessment  Patient needs continued OT Services    Follow Up Recommendations  Home health OT    Barriers to Discharge Decreased caregiver support Wife cannot physically assist. Other family members can provide PRN assist.  Equipment Recommendations  3 in 1 bedside comode    Recommendations for Other Services    Frequency  Min 2X/week    Precautions / Restrictions Precautions Precautions: Back   Pertinent Vitals/Pain See vitals    ADL  Eating/Feeding: Performed;Independent Where Assessed - Eating/Feeding: Chair Grooming: Performed;Wash/dry hands;Set up Where Assessed - Grooming: Supported sitting Upper Body Bathing: Simulated;Set up Where Assessed - Upper Body Bathing: Supported sitting Lower Body Bathing: Simulated;Maximal assistance Where Assessed - Lower Body Bathing: Supported sit to stand Upper Body Dressing: Performed;Minimal assistance Where Assessed - Upper Body Dressing: Supported sitting Lower Body Dressing: Performed;+1 Total assistance Where Assessed - Lower Body Dressing: Supported sit to Pharmacist, hospital: Mining engineer Method: Sit to Barista:  (chair) Toileting - Architect and Hygiene: Performed;Minimal assistance Where Assessed - Engineer, mining and Hygiene: Standing Equipment Used: Gait belt;Rolling walker Transfers/Ambulation Related to ADLs: min assist with RW ADL Comments: Requiring assistance due to pain and precautions    OT Diagnosis: Generalized weakness;Acute pain  OT Problem  List: Decreased strength;Decreased activity tolerance;Decreased knowledge of use of DME or AE;Decreased knowledge of precautions;Pain OT Treatment Interventions: Self-care/ADL training;DME and/or AE instruction;Therapeutic activities;Patient/family education   OT Goals Acute Rehab OT Goals OT Goal Formulation: With patient Time For Goal Achievement: 08/30/12 Potential to Achieve Goals: Good ADL Goals Pt Will Perform Lower Body Bathing: with set-up;Sit to stand from bed;Sit to stand from chair;with adaptive equipment ADL Goal: Lower Body Bathing - Progress: Goal set today Pt Will Perform Lower Body Dressing: with set-up;Sit to stand from chair;Sit to stand from bed;with adaptive equipment ADL Goal: Lower Body Dressing - Progress: Goal set today Pt Will Transfer to Toilet: with supervision;Ambulation;with DME;Comfort height toilet;Maintaining back safety precautions ADL Goal: Toilet Transfer - Progress: Goal set today Pt Will Perform Toileting - Clothing Manipulation: with supervision;Standing ADL Goal: Toileting - Clothing Manipulation - Progress: Goal set today Pt Will Perform Toileting - Hygiene: with supervision;Standing at 3-in-1/toilet ADL Goal: Toileting - Hygiene - Progress: Goal set today Miscellaneous OT Goals Miscellaneous OT Goal #1: Pt will perform bed mobility at supervision level as precursor for EOB ADLs. OT Goal: Miscellaneous Goal #1 - Progress: Goal set today  Visit Information  Last OT Received On: 08/23/12 Assistance Needed: +1 PT/OT Co-Evaluation/Treatment: Yes    Subjective Data      Prior Functioning     Home Living Lives With: Spouse Available Help at Discharge: Family;Other (Comment) (children will help, have to "make arrangements") Type of Home: House Home Access: Stairs to enter Entrance Stairs-Number of Steps: 3 Entrance Stairs-Rails: None Home Layout: One level Bathroom Shower/Tub: Tub/shower unit;Curtain (pt sponge bathes) Bathroom Toilet:  Handicapped height Bathroom Accessibility: Yes How Accessible: Accessible via walker Home Adaptive Equipment: None Additional Comments: pt sleeps in his recliner chair and has for 3 years, oxygen PRN at home- he reports usually 2-3 nights per week Prior Function Level  of Independence: Needs assistance Needs Assistance: Bathing;Meal Prep;Light Housekeeping Bath: Moderate Meal Prep: Total Light Housekeeping: Maximal Comments: had to start doing sponge baths because he could not get into and out of the tub.   Communication Communication: No difficulties Dominant Hand: Right         Vision/Perception     Cognition  Cognition Overall Cognitive Status: Appears within functional limits for tasks assessed/performed Arousal/Alertness: Awake/alert Orientation Level: Appears intact for tasks assessed Behavior During Session: [facility] for tasks performed    Extremity/Trunk Assessment Right Upper Extremity Assessment RUE Sensation: WFL - Light Touch Left Upper Extremity Assessment LUE Sensation: WFL - Light Touch Right Lower Extremity Assessment RLE Sensation: WFL - Light Touch Left Lower Extremity Assessment LLE Sensation: WFL - Light Touch     Mobility Bed Mobility Bed Mobility: Not assessed (pt up in chair) Transfers Transfers: Sit to Stand;Stand to Sit Sit to Stand: 3: Mod assist;From chair/3-in-1;With armrests;With upper extremity assist Stand to Sit: 4: Min assist;To chair/3-in-1;With armrests;With upper extremity assist Details for Transfer Assistance: Assist to power up from lower surface of chair. VCs for safe hand placement.     Exercise     Balance     End of Session OT - End of Session Equipment Utilized During Treatment: Gait belt Activity Tolerance: Patient tolerated treatment well Patient left: in chair;with call bell/phone within reach Nurse Communication: Mobility status  GO    08/23/2012 Cipriano Mile OTR/L Pager [phone] Office  [phone]  Cipriano Mile 08/23/2012, 10:12 AM

## 2012-08-23 NOTE — Evaluation (Signed)
Physical Therapy Evaluation Patient Details Name: [name] [name] MRN: [id number] DOB: Jul 09, 1940 Today's Date: 08/23/2012 Time: 2956-2130 PT Time Calculation (min): 33 min  PT Assessment / Plan / Recommendation Clinical Impression  72 y.o. male admitted [facility] for PLIF L4/5.  He presents today moving ver well min mod assist and we anticipate that he will get mobile enough to go home with wife who cannot provide any assist at home.  Family (son) will be in helping them out.  Recommend HHPT and RW for discharge.      PT Assessment  Patient needs continued PT services    Follow Up Recommendations  Home health PT;Supervision/Assistance - 24 hour    Does the patient have the potential to tolerate intense rehabilitation     NA  Barriers to Discharge Decreased caregiver support wife has physical issues and could only really provide supervision    Equipment Recommendations  Rolling walker with 5" wheels    Recommendations for Other Services   none  Frequency Min 5X/week    Precautions / Restrictions Precautions Precautions: Back Required Braces or Orthoses:  (none)   Pertinent Vitals/Pain VSS      Mobility  Bed Mobility Bed Mobility: Not assessed (pt up in chair) Transfers Sit to Stand: 3: Mod assist;From chair/3-in-1;With armrests;With upper extremity assist Stand to Sit: 4: Min assist;To chair/3-in-1;With armrests;With upper extremity assist Details for Transfer Assistance: Assist to power up from lower surface of chair. VCs for safe hand placement. Ambulation/Gait Ambulation/Gait Assistance: 4: Min assist Ambulation Distance (Feet): 70 Feet Assistive device: Rolling walker Ambulation/Gait Assistance Details: min assist to support trunk for balance  Gait Pattern: Step-through pattern;Shuffle Gait velocity: less than 1.8 ft/sec indicating risk for recurrent falls        PT Diagnosis: Difficulty walking;Abnormality of gait;Generalized weakness;Acute pain  PT Problem  List: Decreased strength;Decreased activity tolerance;Decreased balance;Decreased mobility;Decreased knowledge of use of DME;Decreased knowledge of precautions;Pain PT Treatment Interventions: DME instruction;Gait training;Stair training;Functional mobility training;Therapeutic activities;Therapeutic exercise;Balance training;Neuromuscular re-education;Patient/family education   PT Goals Acute Rehab PT Goals PT Goal Formulation: With patient Time For Goal Achievement: 09/06/12 Potential to Achieve Goals: Good Pt will go Sit to Stand: with supervision PT Goal: Sit to Stand - Progress: Goal set today Pt will go Stand to Sit: with supervision PT Goal: Stand to Sit - Progress: Goal set today Pt will Transfer Bed to Chair/Chair to Bed: with supervision PT Transfer Goal: Bed to Chair/Chair to Bed - Progress: Goal set today Pt will Ambulate: >150 feet;with supervision;with rolling walker PT Goal: Ambulate - Progress: Goal set today Pt will Go Up / Down Stairs: 3-5 stairs;with supervision;with least restrictive assistive device PT Goal: Up/Down Stairs - Progress: Goal set today Additional Goals Additional Goal #1: Pt will report 3/3 back precautions independently PT Goal: Additional Goal #1 - Progress: Goal set today  Visit Information  Last PT Received On: 08/23/12 Assistance Needed: +1 PT/OT Co-Evaluation/Treatment: Yes    Subjective Data  Subjective: Pt reports he sleeps in a recliner chair because when he sleeps in bed both of his legs go numb.   Patient Stated Goal: to get home and take care of his wife.     Prior Functioning  Home Living Lives With: Spouse Available Help at Discharge: Family;Other (Comment) (children will help, have to "make arrangements") Type of Home: House Home Access: Stairs to enter Entrance Stairs-Number of Steps: 3 Entrance Stairs-Rails: None Home Layout: One level Bathroom Shower/Tub: Tub/shower unit;Curtain (pt sponge bathes) Bathroom Toilet:  Handicapped height Bathroom  Accessibility: Yes How Accessible: Accessible via walker Home Adaptive Equipment: None Additional Comments: pt sleeps in his recliner chair and has for 3 years, oxygen PRN at home- he reports usually 2-3 nights per week Prior Function Level of Independence: Needs assistance Needs Assistance: Bathing;Meal Prep;Light Housekeeping Bath: Moderate Meal Prep: Total Light Housekeeping: Maximal Comments: had to start doing sponge baths because he could not get into and out of the tub.   Communication Communication: No difficulties Dominant Hand: Right    Cognition  Cognition Overall Cognitive Status: Appears within functional limits for tasks assessed/performed Arousal/Alertness: Awake/alert Orientation Level: Appears intact for tasks assessed Behavior During Session: [facility] for tasks performed    Extremity/Trunk Assessment Right Upper Extremity Assessment RUE Sensation: WFL - Light Touch Left Upper Extremity Assessment LUE Sensation: WFL - Light Touch Right Lower Extremity Assessment RLE ROM/Strength/Tone: Deficits RLE ROM/Strength/Tone Deficits: generally weak wtih gait.  Decreased leg endurance, but no buckling.  RLE Sensation: WFL - Light Touch Left Lower Extremity Assessment LLE ROM/Strength/Tone: Deficits LLE ROM/Strength/Tone Deficits: generally weak wtih gait.  Decreased leg endurance, but no buckling.  LLE Sensation: WFL - Light Touch      End of Session PT - End of Session Activity Tolerance: Patient limited by fatigue;Patient limited by pain Patient left: in chair;with call bell/phone within reach Nurse Communication: Mobility status;Other (comment) (discharge recs)    Roshun Klingensmith B. Ryen Rhames, PT, DPT [phone]   08/23/2012, 12:22 PM

## 2012-08-23 NOTE — Progress Notes (Signed)
Patient up and ambulating  With PT and OT and observed some wetness on the back of his gown. Checked incision and observed it to be draining at the top of the incision. Drainage is small amount and is bloody in color. Applied dry sterile dressing and will continue to observe.

## 2012-08-24 MED ORDER — MAGNESIUM CITRATE PO SOLN
1.0000 | Freq: Once | ORAL | Status: AC
  Administered 2012-08-24: 1 via ORAL
  Filled 2012-08-24: qty 296

## 2012-08-24 MED ORDER — MILK AND MOLASSES ENEMA
Freq: Once | RECTAL | Status: AC
  Administered 2012-08-24: 22:00:00 via RECTAL
  Filled 2012-08-24: qty 250

## 2012-08-24 NOTE — Progress Notes (Addendum)
Pt continues to c/o discomfort; abd distended. Passing flatus but no BM. Admin mag citrate. Will continue to monitor. Low ext edema + erythema.

## 2012-08-24 NOTE — Progress Notes (Signed)
Physical Therapy Treatment Patient Details Name: [name] [name] MRN: [id number] DOB: 1940-09-21 Today's Date: 08/24/2012 Time: 4098-1191 PT Time Calculation (min): 23 min  PT Assessment / Plan / Recommendation Comments on Treatment Session  72 y.o. male admitted to [facility] for lumbar fusion surgery who is now POD #2.  He presents today with much less ability to tolerate therapy than yesterday due to increased pain and cramping in bil legs and nausea.  Education re: back precautions was completed and handout given.  More family is coming in town to help out with his wife and his eventual return home.      Follow Up Recommendations  Home health PT;Supervision/Assistance - 24 hour     Does the patient have the potential to tolerate intense rehabilitation    yes  Barriers to Discharge   none      Equipment Recommendations  Rolling walker with 5" wheels (wide)    Recommendations for Other Services   none  Frequency Min 5X/week   Plan Discharge plan remains appropriate;Frequency remains appropriate    Precautions / Restrictions Precautions Precautions: Back Precaution Booklet Issued: Yes (comment) Precaution Comments: Back handout given, reviewed back precautions and functional examples of when he might break these precautions Required Braces or Orthoses:  (none)   Pertinent Vitals/Pain 10/10 low back surgical and leg pain.  RN made aware.  Repositioned in recliner chair.  O2 sats 91% on 2 L O2 [place] during mobility.     Mobility  Bed Mobility Bed Mobility: Not assessed (pt was in bathroom when PT/OT entered room) Transfers Sit to Stand: 3: Mod assist;With upper extremity assist;From toilet Stand to Sit: 4: Min assist;With upper extremity assist;With armrests;To chair/3-in-1 Details for Transfer Assistance: mod assist to get off of low toliet to support trunk over weak legs.  Verbal cues for safe hand placement.   Ambulation/Gait Ambulation/Gait Assistance: 4: Min assist Ambulation  Distance (Feet): 10 Feet Assistive device: Rolling walker Ambulation/Gait Assistance Details: min assist to steady pt for balance, verbal cues to stay closer to RW (inside) to help support legs.   Gait Pattern: Step-through pattern;Shuffle;Trunk flexed Gait velocity: less than 1.8 ft/sec indicating risk for recurrent falls General Gait Details: seems to be dragging legs behind him more today.   Significant decrease in gait distance and pt looks like he doesn't feel well.        PT Goals Acute Rehab PT Goals PT Goal: Sit to Stand - Progress: Progressing toward goal PT Goal: Stand to Sit - Progress: Progressing toward goal PT Goal: Ambulate - Progress: Not progressing (due to increase pain and nausea) Additional Goals PT Goal: Additional Goal #1 - Progress: Progressing toward goal  Visit Information  Last PT Received On: 08/24/12 Assistance Needed: +1 PT/OT Co-Evaluation/Treatment: Yes    Subjective Data  Subjective: Pt reports he feels so much worse today.  He is nauseated, gassy (burping, not the other end), His back and leg pain has increased and he is having cramping in bil legs.   Patient Stated Goal: to do what he has to do to get home.     Cognition  Cognition Overall Cognitive Status: Appears within functional limits for tasks assessed/performed Arousal/Alertness: Awake/alert Orientation Level: Appears intact for tasks assessed Behavior During Session: [facility] for tasks performed Cognition - Other Comments: not specifically tested       End of Session PT - End of Session Equipment Utilized During Treatment: Oxygen Activity Tolerance: Patient limited by fatigue;Patient limited by pain Patient left: in chair;with  call bell/phone within reach Nurse Communication: Mobility status;Patient requests pain meds     Ruthanne Mcneish B. Nikie Cid, PT, DPT [phone]   08/24/2012, 12:10 PM

## 2012-08-24 NOTE — Progress Notes (Signed)
Occupational Therapy Treatment Patient Details Name: [name] [name] MRN: [id number] DOB: Feb 23, 1941 Today's Date: 08/24/2012 Time: 4098-1191 OT Time Calculation (min): 18 min  OT Assessment / Plan / Recommendation Comments on Treatment Session pt with nausea, not passing gas, cramping and lack of bowel movmenet at this time. Session limited by nasuea and pain related to bowels    Follow Up Recommendations  Home health OT    Barriers to Discharge       Equipment Recommendations  3 in 1 bedside comode    Recommendations for Other Services    Frequency Min 2X/week   Plan Discharge plan remains appropriate    Precautions / Restrictions Precautions Precautions: Back Precaution Booklet Issued: Yes (comment) Precaution Comments: back handout given and adls with precautions reviewed Required Braces or Orthoses:  (none)   Pertinent Vitals/Pain     ADL  Toilet Transfer: Moderate assistance Toilet Transfer Method: Sit to stand Toilet Transfer Equipment: Regular height toilet;Grab bars Toileting - Clothing Manipulation and Hygiene: Moderate assistance Where Assessed - Toileting Clothing Manipulation and Hygiene: Sit to stand from 3-in-1 or toilet Equipment Used: Rolling walker Transfers/Ambulation Related to ADLs: Pt on toilet on arrival. Pt pushing RW to far ahead and could benefit from baratric RW. ADL Comments: Pt unable to pass gas but is burping at this time. pt has been unable to void bowels and c/o nausea. Pt also with feeling of bloating. Pt obese and difficult to assess if abdomen extended. Pt c/o needing to void bladder immediately with all sit<>Stand    OT Diagnosis:    OT Problem List:   OT Treatment Interventions:     OT Goals Acute Rehab OT Goals OT Goal Formulation: With patient Time For Goal Achievement: 08/30/12 Potential to Achieve Goals: Good ADL Goals Pt Will Perform Lower Body Bathing: with set-up;Sit to stand from bed;Sit to stand from chair;with adaptive  equipment Pt Will Perform Lower Body Dressing: with set-up;Sit to stand from chair;Sit to stand from bed;with adaptive equipment Pt Will Transfer to Toilet: with supervision;Ambulation;with DME;Comfort height toilet;Maintaining back safety precautions ADL Goal: Toilet Transfer - Progress: Progressing toward goals Pt Will Perform Toileting - Clothing Manipulation: with supervision;Standing ADL Goal: Toileting - Clothing Manipulation - Progress: Progressing toward goals Pt Will Perform Toileting - Hygiene: with supervision;Standing at 3-in-1/toilet ADL Goal: Toileting - Hygiene - Progress: Progressing toward goals Miscellaneous OT Goals Miscellaneous OT Goal #1: Pt will perform bed mobility at supervision level as precursor for EOB ADLs. OT Goal: Miscellaneous Goal #1 - Progress: Progressing toward goals  Visit Information  Last OT Received On: 08/24/12 Assistance Needed: +1 PT/OT Co-Evaluation/Treatment: Yes    Subjective Data      Prior Functioning       Cognition  Cognition Overall Cognitive Status: Appears within functional limits for tasks assessed/performed Arousal/Alertness: Awake/alert Orientation Level: Appears intact for tasks assessed Behavior During Session: [facility] for tasks performed Cognition - Other Comments: not specifically tested    Mobility  Bed Mobility Bed Mobility: Not assessed Transfers Sit to Stand: 3: Mod assist;With upper extremity assist;From chair/3-in-1 Stand to Sit: With upper extremity assist;To chair/3-in-1;4: Min assist Details for Transfer Assistance: MOD (A) to descend slowing to chair with control    Exercises      Balance     End of Session OT - End of Session Activity Tolerance: Patient tolerated treatment well Patient left: in chair;with call bell/phone within reach Nurse Communication: Mobility status;Precautions  GO     Lucile Shutters 08/24/2012, 12:32 PM Pager:  [phone]   

## 2012-08-24 NOTE — Progress Notes (Signed)
Patient ID: [name] [name], male   DOB: 08-May-1941, 72 y.o.   MRN: [id number] BP 131/67  Pulse 99  Temp(Src) 98.7 F (37.1 C) (Oral)  Resp 23  Ht 5\' 8"  (1.727 m)  Wt 129.4 kg (285 lb 4.4 oz)  BMI 43.39 kg/m2  SpO2 94% Alert and oriented x 4 Abdominal distension, discomfort. No response to the mg citrate and stool softener. Wound dressing intact.  Has been working with PT Will order enema tonight.

## 2012-08-25 NOTE — Progress Notes (Signed)
Physical Therapy Treatment Patient Details Name: [name] [name] MRN: [id number] DOB: 09/19/1940 Today's Date: 08/25/2012 Time: 8295-6213 PT Time Calculation (min): 29 min  PT Assessment / Plan / Recommendation Comments on Treatment Session  72 y.o. male admitted to [facility] for lumbar fusion surgery who is now POD #3.  Patient progressing  a little better today. Still complaining of abdominal though he did state he had BM earlier this morining. Patient up in the chair x2 today with increased activity tolerance since yesterdays sessoin    Follow Up Recommendations  Home health PT;Supervision/Assistance - 24 hour     Does the patient have the potential to tolerate intense rehabilitation     Barriers to Discharge        Equipment Recommendations  Rolling walker with 5" wheels    Recommendations for Other Services    Frequency Min 5X/week   Plan Discharge plan remains appropriate;Frequency remains appropriate    Precautions / Restrictions Precautions Precautions: Back Restrictions Weight Bearing Restrictions: No   Pertinent Vitals/Pain     Mobility  Bed Mobility Bed Mobility: Rolling Left;Supine to Sit Supine to Sit: 4: Min assist Details for Bed Mobility Assistance: A to help shoulders up off of bed. Cues for safe technique and positioning Transfers Sit to Stand: 4: Min assist;With upper extremity assist;From bed Stand to Sit: 4: Min assist;With upper extremity assist;To chair/3-in-1 Details for Transfer Assistance: A to ensure balance and anterior weight shift forward with stand and to control descent into recliner. Cues for safe technique Ambulation/Gait Ambulation/Gait Assistance: 4: Min assist Ambulation Distance (Feet): 40 Feet Assistive device: Rolling walker Ambulation/Gait Assistance Details: Min A for stability and balance. Cues for posture and safety with RW Gait Pattern: Step-through pattern;Shuffle;Trunk flexed    Exercises     PT Diagnosis:    PT Problem List:    PT Treatment Interventions:     PT Goals Acute Rehab PT Goals PT Goal: Sit to Stand - Progress: Progressing toward goal PT Goal: Stand to Sit - Progress: Progressing toward goal PT Transfer Goal: Bed to Chair/Chair to Bed - Progress: Progressing toward goal PT Goal: Ambulate - Progress: Progressing toward goal  Visit Information  Last PT Received On: 08/25/12 Assistance Needed: +1    Subjective Data      Cognition  Cognition Overall Cognitive Status: Appears within functional limits for tasks assessed/performed Arousal/Alertness: Awake/alert Orientation Level: Appears intact for tasks assessed Behavior During Session: [facility] for tasks performed    Balance     End of Session PT - End of Session Activity Tolerance: Patient tolerated treatment well;Patient limited by fatigue Patient left: in chair Nurse Communication: Mobility status   GP     Fredrich Birks 08/25/2012, 1:40 PM 08/25/2012 Fredrich Birks PTA [phone] pager [phone] office

## 2012-08-25 NOTE — Progress Notes (Deleted)
Physical Therapy Treatment Patient Details Name: [name] [name] MRN: [id number] DOB: 1940-06-04 Today's Date: 08/25/2012 Time: 1478-2956 PT Time Calculation (min): 29 min  PT Assessment / Plan / Recommendation Comments on Treatment Session  72 y.o. male admitted to [facility] for lumbar fusion surgery who is now POD #3.  Patient progressing  a little better today. Still complaining of abdominal though he did state he had BM earlier this morining. Patient up in the chair x2 today with increased activity tolerance since yesterdays sessoin    Follow Up Recommendations  Home health PT;Supervision/Assistance - 24 hour     Does the patient have the potential to tolerate intense rehabilitation     Barriers to Discharge        Equipment Recommendations  Rolling walker with 5" wheels    Recommendations for Other Services    Frequency Min 5X/week   Plan Discharge plan remains appropriate;Frequency remains appropriate    Precautions / Restrictions Precautions Precautions: None Restrictions Weight Bearing Restrictions: No   Pertinent Vitals/Pain     Mobility  Bed Mobility Bed Mobility: Rolling Left;Supine to Sit Supine to Sit: 4: Min assist Details for Bed Mobility Assistance: A to help shoulders up off of bed. Cues for safe technique and positioning Transfers Sit to Stand: 4: Min assist;With upper extremity assist;From bed Stand to Sit: 4: Min assist;With upper extremity assist;To chair/3-in-1 Details for Transfer Assistance: A to ensure balance and anterior weight shift forward with stand and to control descent into recliner. Cues for safe technique Ambulation/Gait Ambulation/Gait Assistance: 4: Min assist Ambulation Distance (Feet): 40 Feet Assistive device: Rolling walker Ambulation/Gait Assistance Details: Min A for stability and balance. Cues for posture and safety with RW Gait Pattern: Step-through pattern;Shuffle;Trunk flexed    Exercises     PT Diagnosis:    PT Problem List:    PT Treatment Interventions:     PT Goals Acute Rehab PT Goals PT Goal: Sit to Stand - Progress: Progressing toward goal PT Goal: Stand to Sit - Progress: Progressing toward goal PT Transfer Goal: Bed to Chair/Chair to Bed - Progress: Progressing toward goal PT Goal: Ambulate - Progress: Progressing toward goal  Visit Information  Last PT Received On: 08/25/12 Assistance Needed: +1    Subjective Data      Cognition  Cognition Overall Cognitive Status: Appears within functional limits for tasks assessed/performed Arousal/Alertness: Awake/alert Orientation Level: Appears intact for tasks assessed Behavior During Session: [facility] for tasks performed    Balance     End of Session PT - End of Session Activity Tolerance: Patient tolerated treatment well;Patient limited by fatigue Patient left: in chair Nurse Communication: Mobility status   GP     Fredrich Birks 08/25/2012, 1:33 PM 08/25/2012 Fredrich Birks PTA [phone] pager [phone] office

## 2012-08-25 NOTE — Progress Notes (Signed)
Pt continues to complain of upset stomach and nausea, refused his evening meds,  Asking for pepto-bismol.  Will give Zofran when due, and continue to monitor patient.

## 2012-08-25 NOTE — Progress Notes (Signed)
Stable, c/o incisional pain. Had an enema last night. Abdomen still distended with positive flatus. Pt/ot to help

## 2012-08-25 NOTE — Progress Notes (Signed)
Pt transferred to 4N Room 3o per MD order. Report called. All questions answered. Family made aware.

## 2012-08-26 ENCOUNTER — Inpatient Hospital Stay (HOSPITAL_COMMUNITY): Payer: Medicare Other

## 2012-08-26 DIAGNOSIS — K560 Paralytic ileus: Secondary | ICD-10-CM | POA: Diagnosis not present

## 2012-08-26 MED ORDER — FLEET ENEMA 7-19 GM/118ML RE ENEM
1.0000 | ENEMA | Freq: Once | RECTAL | Status: AC
  Administered 2012-08-26: 1 via RECTAL
  Filled 2012-08-26: qty 1

## 2012-08-26 MED ORDER — BISMUTH SUBSALICYLATE 262 MG PO CHEW
524.0000 mg | CHEWABLE_TABLET | ORAL | Status: DC | PRN
  Administered 2012-08-26: 524 mg via ORAL
  Filled 2012-08-26: qty 2

## 2012-08-26 NOTE — Progress Notes (Signed)
Pt had 2 view abdominal  Xray  Dr Jeral Fruit page to notify of results , Pt had multiple bowel movements after fleet enema was given , Abdomen slightly soft but some distention still present pt passing lots of gas  ,however pt voiced he feels much better. xray shows small and large bowel better  MD return page and notify of results no new orders given.  Azzie Roup RN

## 2012-08-26 NOTE — Progress Notes (Signed)
Physical Therapy Treatment Patient Details Name: [name] [name] MRN: [id number] DOB: Jun 14, 1940 Today's Date: 08/26/2012 Time: 8657-8469 PT Time Calculation (min): 27 min  PT Assessment / Plan / Recommendation Comments on Treatment Session  Pt mobility progression limited by abdominal pain. Pt required maximal assist for urinal management, assist for transfers, and amb. Pt reports wife to be disabled at home as well. If pt doesn't progress and achieve mod I level of function pt will need SNF due to inadequate support/assist at home.     Follow Up Recommendations  Home health PT;Supervision/Assistance - 24 hour (may need SNF if pt doesn't progress)     Does the patient have the potential to tolerate intense rehabilitation     Barriers to Discharge        Equipment Recommendations  Rolling walker with 5" wheels    Recommendations for Other Services    Frequency Min 5X/week   Plan Discharge plan remains appropriate;Frequency remains appropriate    Precautions / Restrictions Precautions Precautions: Back Precaution Booklet Issued: Yes (comment) Precaution Comments: pt with no recall of precautions - pt re-educated however pt reports "I'm to sick to concentrate." Restrictions Weight Bearing Restrictions: No   Pertinent Vitals/Pain 7/10 back pain, 10/10 abdominal pain    Mobility  Bed Mobility Bed Mobility: Sit to Sidelying Right Sit to Sidelying Right: 4: Min assist;HOB flat Details for Bed Mobility Assistance: A for Bilat LE, max v/c's for log roll technique/adere to back precautions Transfers Transfers: Sit to Stand;Stand to Sit Sit to Stand: 4: Min assist;With upper extremity assist;From bed Stand to Sit: 4: Min assist;With upper extremity assist;To chair/3-in-1 Details for Transfer Assistance: increased time Ambulation/Gait Ambulation/Gait Assistance: 4: Min guard Ambulation Distance (Feet): 15 Feet Assistive device: Rolling walker Ambulation/Gait Assistance Details:  v/c's for walker management, significant increase in time, multiple stops due to abdominal pain Gait Pattern: Step-through pattern;Shuffle;Trunk flexed Gait velocity: decreased Stairs: No    Exercises     PT Diagnosis:    PT Problem List:   PT Treatment Interventions:     PT Goals Acute Rehab PT Goals PT Goal: Sit to Stand - Progress: Progressing toward goal PT Goal: Stand to Sit - Progress: Progressing toward goal PT Transfer Goal: Bed to Chair/Chair to Bed - Progress: Progressing toward goal PT Goal: Ambulate - Progress: Progressing toward goal Additional Goals PT Goal: Additional Goal #1 - Progress: Progressing toward goal  Visit Information  Last PT Received On: 08/26/12 Assistance Needed: +1    Subjective Data  Subjective: Pt received on [facility] with report "I haven't been so sick to my stomach in all my life."   Cognition  Cognition Overall Cognitive Status: Appears within functional limits for tasks assessed/performed Arousal/Alertness: Awake/alert Orientation Level: Appears intact for tasks assessed Behavior During Session: [facility] for tasks performed Cognition - Other Comments: distracted by abdominal pain    Balance     End of Session PT - End of Session Equipment Utilized During Treatment: Gait belt Activity Tolerance:  (limited by abdominal pain) Patient left: in bed;with call bell/phone within reach Nurse Communication: Mobility status   GP     Marcene Brawn 08/26/2012, 1:54 PM  Lewis Shock, PT, DPT Pager #: [phone] Office #: [phone]

## 2012-08-26 NOTE — Progress Notes (Signed)
Dr. Jeral Fruit called around [phone] regarding pt's continuous pain and discomfort in stomach. Zofran given with no relief. Pt had multiple BMs, but abdomen still very distended. Bowel sounds active.  [place] ordered Pepto Bismol to be given to relieve pain.  Salvadore Oxford, RN 08/26/12 2012

## 2012-08-26 NOTE — Progress Notes (Signed)
Patient ID: [name] [name], male   DOB: 12-18-1940, 72 y.o.   MRN: [id number] Abdominal distention, flatyus positive, decrease of incisional pain. Fleet enema and xray abdomen

## 2012-08-27 MED ORDER — PROMETHAZINE HCL 25 MG/ML IJ SOLN
25.0000 mg | Freq: Four times a day (QID) | INTRAMUSCULAR | Status: DC | PRN
  Administered 2012-08-27 – 2012-09-01 (×5): 25 mg via INTRAVENOUS
  Filled 2012-08-27 (×5): qty 1

## 2012-08-27 MED ORDER — SIMETHICONE 80 MG PO CHEW
80.0000 mg | CHEWABLE_TABLET | Freq: Four times a day (QID) | ORAL | Status: DC | PRN
  Administered 2012-08-27 – 2012-09-04 (×8): 80 mg via ORAL
  Filled 2012-08-27 (×9): qty 1

## 2012-08-27 NOTE — Progress Notes (Signed)
Patient ID: [name] [name], male   DOB: [birth year], 72 y.o.   MRN: [id number] BP 121/63  Pulse 86  Temp(Src) 98.5 F (36.9 C) (Oral)  Resp 16  Ht 5\' 8"  (1.727 m)  Wt 129.4 kg (285 lb 4.4 oz)  BMI 43.39 kg/m2  SpO2 94% Alert and oriented x 4.  Moving lower extremities well Wound is clean dry and without signs of infection abd remains distended. Positive for flatus, bm's Moving when  out of bed well.

## 2012-08-27 NOTE — Progress Notes (Addendum)
Patient still c/o of nausea despite Zofran. No vomiting has occurred. Pt has positive BS and flatulence. His abdomen is soft but tender and distended. He has had a bowel movement today. Pt has not been able to tolerate small amounts of clear liquids throughout the day. Patient does not feel that his condition has improved.  Dr. Lovell Sheehan (on call for Dr. Franky Macho) notified and phenergan IV ordered for refractory nausea and vomiting. Will continue to monitor.

## 2012-08-27 NOTE — Clinical Social Work Placement (Addendum)
Clinical Social Work Department CLINICAL SOCIAL WORK PLACEMENT NOTE 08/27/2012  Patient:  [name] [name], [name] [name]  Account Number:  [id number] Admit date:  08/22/2012  Clinical Social Worker:  Peggyann Shoals  Date/time:  08/27/2012 05:12 PM  Clinical Social Work is seeking post-discharge placement for this patient at the following level of care:   SKILLED NURSING   (*CSW will update this form in Epic as items are completed)   08/27/2012  Patient/family provided with [facility] Department of Clinical Social Work's list of facilities offering this level of care within the geographic area requested by the patient (or if unable, by the patient's family).  08/27/2012  Patient/family informed of their freedom to choose among providers that offer the needed level of care, that participate in Medicare, Medicaid or managed care program needed by the patient, have an available bed and are willing to accept the patient.  08/27/2012  Patient/family informed of [facility]' ownership interest in [facility], as well as of the fact that they are under no obligation to receive care at this facility.  PASARR submitted to EDS on  PASARR number received from [employer] on   FL2 transmitted to all facilities in geographic area requested by pt/family on  08/28/2012 FL2 transmitted to all facilities within larger geographic area on   Patient informed that his/her managed care company has contracts with or will negotiate with  certain facilities, including the following:     Patient/family informed of bed offers received:  08/29/2012 Patient chooses bed at [facility] Physician recommends and patient chooses bed at  SNF   Patient to be transferred to [facility] on 09/06/2012 Patient to be transferred to facility by [facility]  The following physician request were entered in Epic:   Additional Comments:  Dede Query, MSW, LCSW [phone]

## 2012-08-27 NOTE — Progress Notes (Signed)
Physical Therapy Treatment Patient Details Name: [name] [name] MRN: [id number] DOB: 05/17/1941 Today's Date: 08/27/2012 Time: 8469-6295 PT Time Calculation (min): 42 min  PT Assessment / Plan / Recommendation Comments on Treatment Session  Still with significant pain (abdomen and back). Condom catheter came off immediately upon standing needing extra time for pericare. After session today and with persistent pain/weakness pt agreeable to SNF rehab prior to home especially as wife is disabled and will not be able to help him. Will alert CSW.     Follow Up Recommendations  SNF     Does the patient have the potential to tolerate intense rehabilitation     Barriers to Discharge        Equipment Recommendations  Rolling walker with 5" wheels    Recommendations for Other Services    Frequency Min 5X/week   Plan Discharge plan needs to be updated;Frequency remains appropriate    Precautions / Restrictions Precautions Precautions: Fall;Back Precaution Booklet Issued: Yes (comment) Precaution Comments: pt with no recall of precautions - pt re-educated    Pertinent Vitals/Pain Reports abdominal pain with nausea, RN made aware and meds provided for nausea, during ambulation he did complain of back pain    Mobility  Bed Mobility Bed Mobility: Not assessed Transfers Transfers: Sit to Stand;Stand to Sit (sit<>stand x3) Sit to Stand: 4: Min guard;From chair/3-in-1;With upper extremity assist Stand to Sit: 4: Min guard;To chair/3-in-1;With upper extremity assist Details for Transfer Assistance: cues for hand placement, increased time due to pain Ambulation/Gait Ambulation/Gait Assistance: 4: Min guard Ambulation Distance (Feet): 30 Feet Assistive device: Rolling walker Ambulation/Gait Assistance Details: cues for tall posture, multiple stops because of pain in back and abdomen, increased time Gait Pattern: Step-through pattern;Trunk flexed General Gait Details: decreased step height        PT Goals Acute Rehab PT Goals PT Goal: Sit to Stand - Progress: Progressing toward goal PT Goal: Stand to Sit - Progress: Progressing toward goal PT Transfer Goal: Bed to Chair/Chair to Bed - Progress: Progressing toward goal PT Goal: Ambulate - Progress: Progressing toward goal Additional Goals PT Goal: Additional Goal #1 - Progress: Progressing toward goal  Visit Information  Last PT Received On: 08/27/12 Assistance Needed: +1    Subjective Data  Subjective: I am still feeling pretty terrible.    Cognition  Cognition Overall Cognitive Status: Appears within functional limits for tasks assessed/performed Arousal/Alertness: Awake/alert Orientation Level: Appears intact for tasks assessed Behavior During Session: [name] for tasks performed Cognition - Other Comments: distracted by abdominal pain    Balance     End of Session PT - End of Session Equipment Utilized During Treatment: Gait belt Activity Tolerance: Patient limited by fatigue;Patient limited by pain Patient left: in chair;with call bell/phone within reach Nurse Communication: Mobility status   GP     [facility] HELEN 08/27/2012, 3:24 PM

## 2012-08-27 NOTE — Progress Notes (Signed)
Occupational Therapy Treatment Patient Details Name: [name] [name] MRN: [id number] DOB: [birth year] Today's Date: 08/27/2012 Time: 4098-1191 OT Time Calculation (min): 20 min  OT Assessment / Plan / Recommendation Comments on Treatment Session Pt remains with abdominal discomfort and poor recall of back precautions. Pt needs SNF placement. SW Sarah notified of SNF     Follow Up Recommendations  SNF    Barriers to Discharge       Equipment Recommendations  3 in 1 bedside comode;Wheelchair (measurements OT);Wheelchair cushion (measurements OT)    Recommendations for Other Services    Frequency Min 2X/week   Plan Discharge plan needs to be updated    Precautions / Restrictions Precautions Precautions: Fall;Back Precaution Booklet Issued: Yes (comment) Precaution Comments: pt with no recall of precautions - pt re-educated    Pertinent Vitals/Pain Abdominal pain Surgical pain    ADL  Lower Body Bathing: Moderate assistance Where Assessed - Lower Body Bathing: Supported sitting Upper Body Dressing: Minimal assistance Where Assessed - Upper Body Dressing: Supported sitting Lower Body Dressing: +1 Total assistance Where Assessed - Lower Body Dressing: Supported sitting Toilet Transfer: Min Pension scheme manager Method: Sit to Barista: Raised toilet seat with arms (or 3-in-1 over toilet) Equipment Used: Rolling walker;Gait belt Transfers/Ambulation Related to ADLs: pt completed sit<>stand from chair ADL Comments: Pt with condom cath coming off with PT Marylou Flesher. OT arriving and assisting with peri care. Pt sitting for peri care and able to wipe inner thigh. Pt is unable to reach BIL below knee due to back precautions and body habitus. Pt total (A) don socks. Pt reports discomfort at scrotum. pt provided towel for elevation of scrotum to decr edema. Pt nauseated and needing basin during session without vomitting. pt reports still feeling abdominal distention.  Pt very upset and at one point states "i should have just stayed home with the pain then go through this" Pt very depressed and could benefit from someone talking to him regarding depression over extended hospitalization    OT Diagnosis:    OT Problem List:   OT Treatment Interventions:     OT Goals Acute Rehab OT Goals OT Goal Formulation: With patient Time For Goal Achievement: 08/30/12 Potential to Achieve Goals: Good ADL Goals Pt Will Perform Lower Body Bathing: with set-up;Sit to stand from bed;Sit to stand from chair;with adaptive equipment ADL Goal: Lower Body Bathing - Progress: Progressing toward goals Pt Will Perform Lower Body Dressing: with set-up;Sit to stand from chair;Sit to stand from bed;with adaptive equipment ADL Goal: Lower Body Dressing - Progress: Progressing toward goals Pt Will Transfer to Toilet: with supervision;Ambulation;with DME;Comfort height toilet;Maintaining back safety precautions ADL Goal: Toilet Transfer - Progress: Progressing toward goals Pt Will Perform Toileting - Clothing Manipulation: with supervision;Standing Pt Will Perform Toileting - Hygiene: with supervision;Standing at 3-in-1/toilet ADL Goal: Toileting - Hygiene - Progress: Progressing toward goals Miscellaneous OT Goals Miscellaneous OT Goal #1: Pt will perform bed mobility at supervision level as precursor for EOB ADLs.  Visit Information  Last OT Received On: 08/27/12 Assistance Needed: +1    Subjective Data      Prior Functioning       Cognition  Cognition Overall Cognitive Status: Appears within functional limits for tasks assessed/performed Arousal/Alertness: Awake/alert Orientation Level: Appears intact for tasks assessed Behavior During Session: [facility] for tasks performed Cognition - Other Comments: distracted by abdominal pain    Mobility  Bed Mobility Bed Mobility: Not assessed Transfers Sit to Stand: 4: Min guard;From chair/3-in-1;With upper  extremity assist Stand  to Sit: 4: Min guard;To chair/3-in-1;With upper extremity assist Details for Transfer Assistance: cues for hand placement, increased time due to pain    Exercises      Balance     End of Session OT - End of Session Activity Tolerance: Patient limited by pain Patient left: Other (comment) (PT ambulation outside of room) Nurse Communication: Mobility status;Precautions   Pt is nausea and discomfort are very similar this session as to last visit while in 3300 step down ICU     Lucile Shutters 08/27/2012, 3:31 PM Pager: [phone]

## 2012-08-27 NOTE — Clinical Social Work Psychosocial (Signed)
Clinical Social Work Department BRIEF PSYCHOSOCIAL ASSESSMENT 08/27/2012  Patient:  [name] [name], [name] [name]     Account Number:  [id number]     Admit date:  08/22/2012  Clinical Social Worker:  Peggyann Shoals  Date/Time:  08/27/2012 04:46 PM  Referred by:  Care Management  Date Referred:  08/27/2012 Referred for  SNF Placement   Other Referral:   Interview type:  Patient Other interview type:    PSYCHOSOCIAL DATA Living Status:  WIFE Admitted from facility:   Level of care:   Primary support name:  [name] [name] Primary support relationship to patient:  SPOUSE Degree of support available:   adequate.    CURRENT CONCERNS Current Concerns  Post-Acute Placement   Other Concerns:    SOCIAL WORK ASSESSMENT / PLAN CSW met with pt to address consult for SNF. CSW intoduced herself and explained role of social work. CSW also explained process of discharging to SNF.    Pt lives with his wife. Pt has been to a rehab facility in the past. Pt is agreeable to SNF placement, as he progressing slowly.    CSW will initiate SNF search and follow up with bed offers. CSW will continue to follow.   Assessment/plan status:  Psychosocial Support/Ongoing Assessment of Needs Other assessment/ plan:   Information/referral to community resources:   SNF List    PATIENT'S/FAMILY'S RESPONSE TO PLAN OF CARE: Pt is alert and oriented. Pt is agreeable to discharging to a SNF.   Dede Query, MSW, LCSW [phone]

## 2012-08-28 ENCOUNTER — Inpatient Hospital Stay (HOSPITAL_COMMUNITY): Payer: Medicare Other

## 2012-08-28 DIAGNOSIS — R110 Nausea: Secondary | ICD-10-CM | POA: Diagnosis not present

## 2012-08-28 DIAGNOSIS — Z4682 Encounter for fitting and adjustment of non-vascular catheter: Secondary | ICD-10-CM | POA: Diagnosis not present

## 2012-08-28 DIAGNOSIS — R109 Unspecified abdominal pain: Secondary | ICD-10-CM | POA: Diagnosis not present

## 2012-08-28 NOTE — Progress Notes (Signed)
Patient ID: [name] [name], male   DOB: 1941-05-12, 72 y.o.   MRN: [id number] BP 122/81  Pulse 77  Temp(Src) 99.5 F (37.5 C) (Oral)  Resp 18  Ht 5\' 8"  (1.727 m)  Wt 129.4 kg (285 lb 4.4 oz)  BMI 43.39 kg/m2  SpO2 95% Abdomen is quite distended still Has agreed to a nasogastric tube Remains his biggest complaint. Lumbar pain is improved.

## 2012-08-28 NOTE — Progress Notes (Signed)
NCM spoke to pt and he is requesting SNF for rehab. CSW made aware to follow up on SNF at dc. Pt states he does not have anyone in the home that can provide assistance. Wife can help but it is limited due to her medical conditions.   Isidoro Donning RN CCM Case Mgmt phone [phone]

## 2012-08-28 NOTE — Progress Notes (Signed)
Physical Therapy Treatment Patient Details Name: Nassir Neidert MRN: [id number] DOB: 1941/04/03 Today's Date: 08/28/2012 Time: 4098-1191 PT Time Calculation (min): 11 min  PT Assessment / Plan / Recommendation Comments on Treatment Session  pt reluctantly agreeable to try OOB today. Pt c/o significant abdominal pain affecting mobility and causing nausea. Pt was unable to recall any back precautions when cued. Reinforced back precautions and log roll technique. Pt after 6-7 mins. of waiting to complete bed mobility declined due to abdominal pain.  Pt reported he will get OOB with nurning staff later today when he isn't so sick. Will continue to follow for mobility. Pt's frequency decreased to 3x/wk for mobility due to d/c plan of SNF and decreased tolerance for mobility.    Follow Up Recommendations  SNF     Does the patient have the potential to tolerate intense rehabilitation     Barriers to Discharge        Equipment Recommendations       Recommendations for Other Services    Frequency Min 3X/week   Plan Discharge plan remains appropriate;Frequency needs to be updated    Precautions / Restrictions Precautions Precautions: Back Precaution Comments: Pt unable to recall any back precautions. Reveiwed and discussed 3/3 back precautions and log roll technique for bed mobility.   Pertinent Vitals/Pain     Mobility  Bed Mobility Bed Mobility: Not assessed Supine to Sit: Not tested (comment) (pt declined due to abd. pain) Transfers Transfers: Not assessed Ambulation/Gait Ambulation/Gait Assistance: Not tested (comment)    Exercises     PT Diagnosis:    PT Problem List:   PT Treatment Interventions:     PT Goals Acute Rehab PT Goals PT Goal: Sit to Stand - Progress: Not progressing (due to abdominal pain) PT Goal: Stand to Sit - Progress: Not progressing (due to abdominal pain) PT Goal: Ambulate - Progress: Not progressing (due to abdominal pain) Additional Goals PT Goal:  Additional Goal #1 - Progress: Not progressing (recalled 0/3 precautions)  Visit Information  Last PT Received On: 08/28/12 Assistance Needed: +1    Subjective Data  Subjective: I could do anything you wanted if my stomach didn't hurt.   Cognition  Cognition Overall Cognitive Status: Appears within functional limits for tasks assessed/performed Arousal/Alertness: Awake/alert Orientation Level: Appears intact for tasks assessed Behavior During Session: Restless Cognition - Other Comments: restless due to abdominal discomfort.    Balance     End of Session PT - End of Session Activity Tolerance: Patient limited by pain Patient left: in bed Nurse Communication: Mobility status   GP     Greggory Stallion 08/28/2012, 8:18 AM

## 2012-08-28 NOTE — Progress Notes (Signed)
Pt continues to complain about pain in abdomen. Pt rates pain 10/10. Abdomen is distended and firm. Bowel sounds are present. Pt has not been passing gas tonight.  Pt also complains of nausea with dry heaves, pt has not vomited. Nausea and abdominal pain are unrelieved by medication.  Md notified. Order for KUB given. RN will continue to monitor.

## 2012-08-29 NOTE — Progress Notes (Signed)
Physical Therapy Treatment Patient Details Name: [name] [name] MRN: [id number] DOB: January 03, 1941 Today's Date: 08/29/2012 Time: 6213-0865 PT Time Calculation (min): 37 min  PT Assessment / Plan / Recommendation Comments on Treatment Session  72 y.o. male admitted to [facility] for lumbar fusion surgery with recovery complicated by abdominal pain/distension. Pain improved today with NGT allowing for longer distance ambulation.      Follow Up Recommendations  SNF     Does the patient have the potential to tolerate intense rehabilitation     Barriers to Discharge        Equipment Recommendations  Rolling walker with 5" wheels    Recommendations for Other Services    Frequency Min 3X/week   Plan Discharge plan remains appropriate;Frequency remains appropriate    Precautions / Restrictions Precautions Precautions: Fall Precaution Comments: NG tube, lumbar wound vac Restrictions Weight Bearing Restrictions: No   Pertinent Vitals/Pain Reports 7/10 abdominal pain as his biggest complaint but says this is much improved from before    Mobility  Bed Mobility Bed Mobility: Right Sidelying to Sit Right Sidelying to Sit: 4: Min assist Details for Bed Mobility Assistance: minA to reposition and bring legs off bed for sidelying->sit Transfers Transfers: Sit to Stand;Stand to Sit Sit to Stand: 4: Min guard;From bed;With upper extremity assist Stand to Sit: 4: Min guard;To chair/3-in-1;With upper extremity assist Details for Transfer Assistance: gaurding for safety and assist to coordinate lines/RW Ambulation/Gait Ambulation/Gait Assistance: 4: Min guard Ambulation Distance (Feet): 90 Feet Assistive device: Rolling walker Ambulation/Gait Assistance Details: cues for tall posture, improved speed with gait however still needing several standing rests to recover Gait Pattern: Step-through pattern    Exercises General Exercises - Lower Extremity Toe Raises: AROM;Both;5 reps;Seated Heel Raises:  AROM;Both;5 reps;Seated    PT Goals Acute Rehab PT Goals Pt will Roll Supine to Right Side: with modified independence PT Goal: Rolling Supine to Right Side - Progress: Goal set today Pt will Roll Supine to Left Side: with modified independence PT Goal: Rolling Supine to Left Side - Progress: Goal set today Pt will go Supine/Side to Sit: with modified independence PT Goal: Supine/Side to Sit - Progress: Goal set today Pt will go Sit to Stand: with supervision PT Goal: Sit to Stand - Progress: Progressing toward goal Pt will go Stand to Sit: with supervision PT Goal: Stand to Sit - Progress: Progressing toward goal Pt will Transfer Bed to Chair/Chair to Bed: with supervision PT Transfer Goal: Bed to Chair/Chair to Bed - Progress: Progressing toward goal Pt will Ambulate: >150 feet;with supervision;with rolling walker PT Goal: Ambulate - Progress: Progressing toward goal PT Goal: Up/Down Stairs - Progress: Discontinued (comment) (not a relevant goal as d/c to SNF is plan) Additional Goals Additional Goal #1: Pt will report 3/3 back precautions independently PT Goal: Additional Goal #1 - Progress: Progressing toward goal  Visit Information  Last PT Received On: 08/29/12 Assistance Needed: +1    Subjective Data  Subjective: I feel much better. (with ng tube)   Cognition  Cognition Overall Cognitive Status: Appears within functional limits for tasks assessed/performed Arousal/Alertness: Awake/alert Orientation Level: Appears intact for tasks assessed Behavior During Session: [facility] for tasks performed    Balance     End of Session PT - End of Session Equipment Utilized During Treatment: Gait belt Activity Tolerance: Patient tolerated treatment well Patient left: in chair;with call bell/phone within reach Nurse Communication: Mobility status   GP     [facility] HELEN 08/29/2012, 3:40 PM

## 2012-08-29 NOTE — Progress Notes (Signed)
Patient ID: [name] [name], male   DOB: [birth year], 72 y.o.   MRN: [id number] BP 128/72  Pulse 90  Temp(Src) 98.6 F (37 C) (Oral)  Resp 20  Ht 5\' 8"  (1.727 m)  Wt 129.4 kg (285 lb 4.4 oz)  BMI 43.39 kg/m2  SpO2 96% NG tube has helped with the abdominal discomfort. Did better with PT today.  Moving all extremities well Wound is clean and dry. Cont with ng, no BM x3days

## 2012-08-29 NOTE — Clinical Social Work Note (Signed)
Clinical Social Work   CSW met with pt and provided bed offers. Pt has chosen [facility]. CSW contacted facility and confirmed bed availability. CSW will continue to follow.   Dede Query, MSW, LCSW [phone]

## 2012-08-30 NOTE — Progress Notes (Signed)
Patient ID: [name] [name], male   DOB: 06-Feb-1941, 72 y.o.   MRN: [id number] BP 112/64  Pulse 86  Temp(Src) 98.2 F (36.8 C) (Oral)  Resp 18  Ht 5\' 8"  (1.727 m)  Wt 129.4 kg (285 lb 4.4 oz)  BMI 43.39 kg/m2  SpO2 97% Alert and oriented x 4 Moving lower extremities well Ng out today, had large bowel movement, is feeling better Possible discharge tomorrow

## 2012-08-30 NOTE — Progress Notes (Signed)
Pt accidentally pulled ut  NGT which was clamped . Diet advanced to full liquid pt tolerated 40%   Abdominal distention reducing . Had 2 moderate amount of loose BM and passing lots of gas.   MD made aware of NGT being out. . Stated okay . Marland Kitchen No new order given . Pt stable. Azzie Roup RN.

## 2012-08-31 ENCOUNTER — Inpatient Hospital Stay (HOSPITAL_COMMUNITY): Payer: Medicare Other

## 2012-08-31 DIAGNOSIS — R143 Flatulence: Secondary | ICD-10-CM | POA: Diagnosis not present

## 2012-08-31 DIAGNOSIS — R141 Gas pain: Secondary | ICD-10-CM | POA: Diagnosis not present

## 2012-08-31 MED ORDER — OXYCODONE-ACETAMINOPHEN 10-325 MG PO TABS: 1.0000 | ORAL_TABLET | Freq: Four times a day (QID) | ORAL | Status: DC | PRN

## 2012-08-31 MED ORDER — CYCLOBENZAPRINE HCL 10 MG PO TABS: 10.0000 mg | ORAL_TABLET | Freq: Three times a day (TID) | ORAL | Status: DC | PRN

## 2012-08-31 MED ORDER — MILK AND MOLASSES ENEMA
Freq: Once | RECTAL | Status: AC
  Administered 2012-08-31: 240 mL via RECTAL
  Filled 2012-08-31 (×2): qty 250

## 2012-08-31 NOTE — Progress Notes (Signed)
CSW was notified that patient would be d/c'd today to [facility].  Packet completed and planned d/c; CSW spoke with Dr. Mikal Plane who agreed to d/c.  Nursing however stated that patient is complaining of abdominal pain now and per MD- d/c is delayed.  CSW notified Wanita Chamberlain, SW [facility]. They will not be able to admit paper over the weekend but will plan admission for Monday if medically stable.  Lorri Frederick. [place]  [phone]

## 2012-08-31 NOTE — Progress Notes (Signed)
PT Cancellation Note  Patient Details Name: Shade Kaley MRN: [id number] DOB: 1940-12-02   Cancelled Treatment:    Reason Eval/Treat Not Completed: Medical issues which prohibited therapy.  Attempted to see patient x2 today.  Patient with nausea/vomiting.  Will return tomorrow for PT session.   Vena Austria 08/31/2012, 5:17 PM

## 2012-08-31 NOTE — Progress Notes (Signed)
Patient ID: [name] [name], male   DOB: Jan 29, 1941, 72 y.o.   MRN: [id number] BP 117/71  Pulse 85  Temp(Src) 97.8 F (36.6 C) (Oral)  Resp 20  Ht 5\' 8"  (1.727 m)  Wt 129.4 kg (285 lb 4.4 oz)  BMI 43.39 kg/m2  SpO2 94% Alert and oriented. Was set for discharge but then complained of severe pain in abdomen once more. KUB showed distended bowel, no free air. Will give enema again. If no results will consult GI tomorrow per patients request.  Wound dressing in place. Can be removed without replacement when he is discharge.  Moving all extremities well.

## 2012-08-31 NOTE — Discharge Summary (Signed)
Physician Discharge Summary  Patient ID: [name] [name] MRN: [id number] DOB/AGE: [birth year] 72 y.o.  Admit date: 08/22/2012 Discharge date: 08/31/2012  Admission Diagnoses:lumbar spondylsosis, lumbar stenosis, lumbar radiculopathy  Discharge Diagnoses: postoperative ileus Principal Problem:   Other spondylosis with radiculopathy, lumbar region   Discharged Condition: good  Hospital Course: Mr. Ozburn was admitted to the hospital and underwent an uncomplicated lumbar fusion and decompression. Post op he has had improved pain in the lower extremities. He however did have a post operative ileus which eventually required nasogastric suction. At discharge he does feel better. His wound is clean, dry, and without signs of infection. He is moving lower extremities well. He is voiding voluntarily.   Consults: None  Significant Diagnostic Studies: radiology: KUB: distended bowel  Treatments: IV hydration and surgery: POSTERIOR LUMBAR FUSION 1 LEVEL L4/5, Peek interbody cages 14x18mm x2 Posterolateral arthrodesis L4/5 Non segmental pedicle screw fixation L4/5 Nuvasive   Discharge Exam: Blood pressure 115/65, pulse 83, temperature 97.7 F (36.5 C), temperature source Oral, resp. rate 20, height 5\' 8"  (1.727 m), weight 129.4 kg (285 lb 4.4 oz), SpO2 92.00%. General appearance: alert, cooperative and appears stated age Neurologic: Alert and oriented X 3, normal strength and tone. Normal symmetric reflexes. Normal coordination and gait  Disposition: 01-Home or Self Care   Future Appointments Provider Department Dept Phone   12/28/2012 10:30 AM [facility] [phone]   01/01/2013 10:30 AM Randall An, MD [facility] [phone]       Medication List    TAKE these medications       acetaminophen 325 MG tablet  Commonly known as:  TYLENOL  Take 650 mg by mouth every 6 (six) hours as needed for pain.     atorvastatin 80 MG tablet  Commonly  known as:  LIPITOR  Take 80 mg by mouth at bedtime.     cetirizine 10 MG tablet  Commonly known as:  ZYRTEC  Take 10 mg by mouth daily.     cyclobenzaprine 10 MG tablet  Commonly known as:  FLEXERIL  Take 1 tablet (10 mg total) by mouth 3 (three) times daily as needed for muscle spasms.     docusate sodium 100 MG capsule  Commonly known as:  COLACE  Take 100 mg by mouth at bedtime.     Dorzolamide HCl-Timolol Mal PF 22.3-6.8 MG/ML Soln  Apply 1 drop to eye 2 (two) times daily. Dorzolamide HCI-Timolol Maleate Ophthalmic Solution 2.23%/0.68%     doxycycline 100 MG capsule  Commonly known as:  VIBRAMYCIN  Take 1 capsule (100 mg total) by mouth 2 (two) times daily.     furosemide 80 MG tablet  Commonly known as:  LASIX  Take 80 mg by mouth 2 (two) times daily.     latanoprost 0.005 % ophthalmic solution  Commonly known as:  XALATAN  Place 1 drop into both eyes at bedtime.     LUBRICANT EYE DROPS 0.4-0.3 % Soln  Generic drug:  Polyethyl Glycol-Propyl Glycol  Apply 1 drop to eye daily as needed (for lubricant eye drops).     metoprolol succinate 25 MG 24 hr tablet  Commonly known as:  TOPROL-XL  Take 12.5 mg by mouth every morning.     ondansetron 4 MG tablet  Commonly known as:  ZOFRAN  Take 1 tablet (4 mg total) by mouth every 8 (eight) hours as needed. For nausea     OVER THE COUNTER MEDICATION  Apply 1 application topically daily  after breakfast. "OTC cream" for pain     oxyCODONE-acetaminophen 10-325 MG per tablet  Commonly known as:  PERCOCET  Take 1 tablet by mouth every 6 (six) hours as needed for pain.     pantoprazole 40 MG tablet  Commonly known as:  PROTONIX  Take 40 mg by mouth 2 (two) times daily.     potassium chloride SA 20 MEQ tablet  Commonly known as:  K-DUR,KLOR-CON  Take 1 tablet (20 mEq total) by mouth 2 (two) times daily.     tamsulosin 0.4 MG Caps  Commonly known as:  FLOMAX  Take 0.4 mg by mouth daily after supper.          Signed: Rosana Farnell L 08/31/2012, 1:49 PM

## 2012-09-01 MED ORDER — PROMETHAZINE HCL 25 MG/ML IJ SOLN
12.5000 mg | Freq: Three times a day (TID) | INTRAMUSCULAR | Status: DC | PRN
Start: 2012-09-01 — End: 2012-09-06
  Administered 2012-09-01 – 2012-09-02 (×2): 12.5 mg via INTRAMUSCULAR
  Filled 2012-09-01: qty 1

## 2012-09-01 MED ORDER — MAGIC MOUTHWASH
5.0000 mL | Freq: Four times a day (QID) | ORAL | Status: DC | PRN
  Administered 2012-09-01: 5 mL via ORAL
  Filled 2012-09-01: qty 5

## 2012-09-01 NOTE — Progress Notes (Signed)
Pt had an enema with good results, he states he feels a little bit better--- Jessiah Wojnar, rn

## 2012-09-01 NOTE — Progress Notes (Signed)
Physical Therapy Treatment Patient Details Name: Clydell Sposito MRN: [id number] DOB: [birth year] Today's Date: 09/01/2012 Time: 5284-1324 PT Time Calculation (min): 23 min  PT Assessment / Plan / Recommendation Comments on Treatment Session  Pt very pleasant & willing to participate.  States pain & nausea improved at this date but pressure in low back limits ambulation distance     Follow Up Recommendations  SNF     Does the patient have the potential to tolerate intense rehabilitation     Barriers to Discharge        Equipment Recommendations  Rolling walker with 5" wheels    Recommendations for Other Services    Frequency Min 3X/week   Plan Discharge plan remains appropriate;Frequency remains appropriate    Precautions / Restrictions Precautions Precautions: Fall Precaution Comments: lumbar wound vac Restrictions Weight Bearing Restrictions: No       Mobility  Bed Mobility Bed Mobility: Rolling Left;Left Sidelying to Sit;Sitting - Scoot to Edge of Bed Rolling Left: 4: Min assist;With rail Left Sidelying to Sit: 4: Min guard;With rails Sitting - Scoot to Edge of Bed: 5: Supervision Details for Bed Mobility Assistance: (A) to initiate rolling.  Use of rails.  Cues for technique.   Transfers Transfers: Sit to Stand;Stand to Sit Sit to Stand: 4: Min guard;With upper extremity assist;From bed Stand to Sit: 5: Supervision;With upper extremity assist;With armrests;To chair/3-in-1 Details for Transfer Assistance: Cues for use of UE's to control descent.   Ambulation/Gait Ambulation/Gait Assistance: 4: Min guard Ambulation Distance (Feet): 100 Feet Assistive device: Rolling walker Ambulation/Gait Assistance Details: Slow gait speed.  Small steps.  Relies heavily on RW with UE's.   Gait Pattern: Step-through pattern;Decreased stride length;Shuffle (decreased floor clearance) Gait velocity: decreased Stairs: No Wheelchair Mobility Wheelchair Mobility: No      PT  Goals Acute Rehab PT Goals Time For Goal Achievement: 09/06/12 Potential to Achieve Goals: Good Pt will Roll Supine to Right Side: with modified independence Pt will Roll Supine to Left Side: with modified independence PT Goal: Rolling Supine to Left Side - Progress: Progressing toward goal Pt will go Supine/Side to Sit: with modified independence Pt will go Sit to Stand: with supervision PT Goal: Sit to Stand - Progress: Progressing toward goal Pt will go Stand to Sit: with supervision PT Goal: Stand to Sit - Progress: Met Pt will Transfer Bed to Chair/Chair to Bed: with supervision Pt will Ambulate: >150 feet;with supervision;with rolling walker PT Goal: Ambulate - Progress: Progressing toward goal Pt will Go Up / Down Stairs: 3-5 stairs;with supervision;with least restrictive assistive device Additional Goals Additional Goal #1: Pt will report 3/3 back precautions independently PT Goal: Additional Goal #1 - Progress: Progressing toward goal  Visit Information  Last PT Received On: 09/01/12 Assistance Needed: +1    Subjective Data      Cognition  Cognition Overall Cognitive Status: Appears within functional limits for tasks assessed/performed Arousal/Alertness: Awake/alert Orientation Level: Appears intact for tasks assessed Behavior During Session: [employer] for tasks performed    Balance     End of Session PT - End of Session Equipment Utilized During Treatment: Gait belt Activity Tolerance: Patient tolerated treatment well Patient left: in chair;with call bell/phone within reach Nurse Communication: Mobility status     Verdell Face, [place] [phone] 09/01/2012

## 2012-09-01 NOTE — Progress Notes (Signed)
Filed Vitals:   08/31/12 1958 08/31/12 2143 09/01/12 0055 09/01/12 0804  BP: 120/62 134/72 126/64 121/74  Pulse: 87 90 88 85  Temp: 99.1 F (37.3 C) 98.3 F (36.8 C) 98 F (36.7 C) 98.3 F (36.8 C)  TempSrc:  Oral Oral Oral  Resp: 20 18 16 18   Height:      Weight:      SpO2: 92% 97% 95% 95%    Patient resting in bed comfortably. Reports that he's been up and out of bed to chair, with some limited ambulation. Had a good bowel movement response to enema. Taking full liquids well. Wound VAC in place.  Plan: Continuing current care.  Hewitt Shorts, MD 09/01/2012, 8:37 AM

## 2012-09-02 ENCOUNTER — Encounter (HOSPITAL_COMMUNITY): Payer: Self-pay | Admitting: Gastroenterology

## 2012-09-02 DIAGNOSIS — K5900 Constipation, unspecified: Secondary | ICD-10-CM | POA: Diagnosis not present

## 2012-09-02 DIAGNOSIS — R109 Unspecified abdominal pain: Secondary | ICD-10-CM | POA: Diagnosis not present

## 2012-09-02 DIAGNOSIS — R933 Abnormal findings on diagnostic imaging of other parts of digestive tract: Secondary | ICD-10-CM | POA: Diagnosis not present

## 2012-09-02 LAB — COMPREHENSIVE METABOLIC PANEL
ALT: 8 U/L (ref 0–53)
AST: 14 U/L (ref 0–37)
Albumin: 2.8 g/dL — ABNORMAL LOW (ref 3.5–5.2)
CO2: 29 mEq/L (ref 19–32)
Calcium: 9.3 mg/dL (ref 8.4–10.5)
Chloride: 96 mEq/L (ref 96–112)
GFR calc non Af Amer: >90 mL/min (ref >90)
Sodium: 133 mEq/L — ABNORMAL LOW (ref 135–145)

## 2012-09-02 LAB — CBC
Hemoglobin: 14.3 g/dL (ref 13.0–17.0)
MCH: 26.9 pg (ref 26.0–34.0)
MCHC: 35 g/dL (ref 30.0–36.0)
MCV: 77 fL — ABNORMAL LOW (ref 78.0–100.0)

## 2012-09-02 LAB — PHOSPHORUS: Phosphorus: 2.6 mg/dL (ref 2.3–4.6)

## 2012-09-02 MED ORDER — METOCLOPRAMIDE HCL 5 MG/ML IJ SOLN
5.0000 mg | Freq: Four times a day (QID) | INTRAMUSCULAR | Status: DC
  Administered 2012-09-02 – 2012-09-05 (×15): 5 mg via INTRAVENOUS
  Administered 2012-09-06: 06:00:00 via INTRAVENOUS
  Administered 2012-09-06: 5 mg via INTRAVENOUS
  Filled 2012-09-02 (×20): qty 1

## 2012-09-02 MED ORDER — MORPHINE SULFATE 2 MG/ML IJ SOLN
1.0000 mg | INTRAMUSCULAR | Status: DC | PRN
  Administered 2012-09-02 – 2012-09-03 (×6): 4 mg via INTRAVENOUS
  Administered 2012-09-04: 2 mg via INTRAVENOUS
  Administered 2012-09-04: 4 mg via INTRAVENOUS
  Administered 2012-09-04: 2 mg via INTRAVENOUS
  Administered 2012-09-05 (×2): 4 mg via INTRAVENOUS
  Administered 2012-09-05 – 2012-09-06 (×4): 2 mg via INTRAVENOUS
  Filled 2012-09-02 (×3): qty 2
  Filled 2012-09-02: qty 1
  Filled 2012-09-02 (×3): qty 2
  Filled 2012-09-02: qty 1
  Filled 2012-09-02 (×4): qty 2
  Filled 2012-09-02 (×3): qty 1

## 2012-09-02 MED ORDER — POLYETHYLENE GLYCOL 3350 17 G PO PACK
17.0000 g | PACK | Freq: Two times a day (BID) | ORAL | Status: DC
  Administered 2012-09-02 (×2): 17 g via ORAL
  Filled 2012-09-02 (×4): qty 1

## 2012-09-02 MED ORDER — PANTOPRAZOLE SODIUM 40 MG IV SOLR
40.0000 mg | Freq: Two times a day (BID) | INTRAVENOUS | Status: DC
  Administered 2012-09-02 – 2012-09-05 (×7): 40 mg via INTRAVENOUS
  Filled 2012-09-02 (×9): qty 40

## 2012-09-02 NOTE — Consult Note (Signed)
Reason for Consult: Ileus Referring Physician: Neurosurgical team  Gianlucas Evenson is an 72 y.o. male.  HPI: Patient status post neck surgery with a fairly long history of GI issues followed at  [facility] with fairly recent colonoscopy x2 and endoscopy and history lactose intolerance and some constipation however he did not have an ileus after his other back surgery but supposedly in [place] years ago when he had a kidney problem he felt similar and his abdomen was distended as it is now. He uses probiotic and some stool softeners at home but still has some trouble and his father had some lower GI issues as well and when he got an enema that seemed to help and he is having occasional small bowel movements but has not passed much air and feels like he has to vomit but can not but does not think he was any better with his NG tube and he has no other complaints Past Medical History  Diagnosis Date  . CHF (congestive heart failure)   . Obesity   . Hyperlipidemia   . PUD (peptic ulcer disease)   . GERD (gastroesophageal reflux disease)   . S/P endoscopy 2007, 2012    2007: nl, May 2012: antral erosions  . Osteoarthritis   . S/P colonoscopy 2007, Feb and March 2012    hx of adenomas, left-sided diverticula, On 07/2010 TCS, fresh blood and clot noted coming from TI.  Marland Kitchen Anxiety   . Thyroid disease     hypothyroid  . Ringing in ears   . History of kidney stones   . History of bladder infections   . Heel spur     right heel  . Collagen vascular disease     history of venous insufficency and LE cellulitis  . Thrombocytopenia 12/30/2011    Stable  . BPH (benign prostatic hyperplasia)   . Sleep apnea     uses oxygen cannot tolerate CPAP  . Hypertension     Sees Dr. Rennis Golden  . Bilateral lower leg cellulitis     Past Surgical History  Procedure Laterality Date  . Knee arthroscopy    . Rotator cuff repair    . Cholecystectomy    . Lumbar spine surgery    . Small bowel capsule study   07/2011    ?transient focal ischemia in distal small bowel to explain GI bleeding  . Esophagogastroduodenoscopy  Aug 2013    [facility], single 5mm pedunculated polyp otherwise normal. HYPERPLASTIC, NEGATIVE h.pylori  . Cardiac catheterization      02/27/07: no sign CAD, LVH, mod pulm HTN    Family History  Problem Relation Age of Onset  . Colon cancer Father 74    deceased  . Prostate cancer Father   . Pancreatic cancer Mother 34    deceased    Social History:  reports that he quit smoking about 39 years ago. His smoking use included Cigars. He quit smokeless tobacco use about 39 years ago. His smokeless tobacco use included Chew. He reports that he does not drink alcohol or use illicit drugs.  Allergies:  Allergies  Allergen Reactions  . Aspirin Other (See Comments)    G.I. Bleed     Medications: I have reviewed the patient's current medications.  Results for orders placed during the hospital encounter of 08/22/12 (from the past 48 hour(s))  COMPREHENSIVE METABOLIC PANEL     Status: Abnormal   Collection Time    09/02/12 10:44 AM      Result Value Range  Sodium 133 (*) 135 - 145 mEq/L   Potassium 3.9  3.5 - 5.1 mEq/L   Chloride 96  96 - 112 mEq/L   CO2 29  19 - 32 mEq/L   Glucose, Bld 150 (*) 70 - 99 mg/dL   BUN 8  6 - 23 mg/dL   Creatinine, Ser 7.25  0.50 - 1.35 mg/dL   Calcium 9.3  8.4 - 36.6 mg/dL   Total Protein 6.3  6.0 - 8.3 g/dL   Albumin 2.8 (*) 3.5 - 5.2 g/dL   AST 14  0 - 37 U/L   ALT 8  0 - 53 U/L   Alkaline Phosphatase 86  39 - 117 U/L   Total Bilirubin 1.5 (*) 0.3 - 1.2 mg/dL   GFR calc non Af Amer >90  >90 mL/min   GFR calc Af Amer >90  >90 mL/min   Comment:            The eGFR has been calculated     using the CKD EPI equation.     This calculation has not been     validated in all clinical     situations.     eGFR's persistently     <90 mL/min signify     possible Chronic Kidney Disease.  CBC     Status: Abnormal   Collection Time    09/02/12  10:44 AM      Result Value Range   WBC 20.4 (*) 4.0 - 10.5 K/uL   RBC 5.31  4.22 - 5.81 MIL/uL   Hemoglobin 14.3  13.0 - 17.0 g/dL   HCT 44.0  34.7 - 42.5 %   MCV 77.0 (*) 78.0 - 100.0 fL   MCH 26.9  26.0 - 34.0 pg   MCHC 35.0  30.0 - 36.0 g/dL   RDW 95.6 (*) 38.7 - 56.4 %   Platelets 155  150 - 400 K/uL  PHOSPHORUS     Status: None   Collection Time    09/02/12 10:44 AM      Result Value Range   Phosphorus 2.6  2.3 - 4.6 mg/dL  MAGNESIUM     Status: None   Collection Time    09/02/12 10:44 AM      Result Value Range   Magnesium 1.9  1.5 - 2.5 mg/dL    Dg Abd 2 Views  3/32/9518  *RADIOLOGY REPORT*  Clinical Data: Abdominal distention.  ABDOMEN - 2 VIEW  Comparison: Plain films of the abdomen 08/26/2012 and 08/28/2012.  Findings: Diffuse gaseous distention of small and large bowel persists without marked change.  No free intraperitoneal air is identified.  IMPRESSION: No notable change in bowel gas pattern compatible with ileus.   Original Report Authenticated By: Holley Dexter, M.D.     ROS negative except above he does say his oral medicines hurt his stomach Blood pressure 117/62, pulse 79, temperature 98.5 F (36.9 C), temperature source Oral, resp. rate 20, height 5\' 8"  (1.727 m), weight 129.4 kg (285 lb 4.4 oz), SpO2 95.00%. Physical Exam vital signs stable afebrile no acute distress abdomen obviously distended but not tender occasional bowel sounds no rebound x-ray reviewed white count increased to 20 other labs are okay  Assessment/Plan: Probable ileus increased white count slightly worrisome Plan: Will have a low threshold for replacing NG tube however will change his diet to clears and try low-dose Reglan and consider either a higher dose or erythromycin next and will try some low dose MiraLax and repeat labs  tomorrow and change his Protonix to IV and decrease pain medicine and hopefully this will work and I will ask one of my partners to check on tomorrow  [facility]  E 09/02/2012, 1:34 PM

## 2012-09-02 NOTE — Progress Notes (Signed)
Subjective: Patient reports nausea, vomiting and At BMD other day but still feels distended nauseous and vomiting and not able to hold food down  Objective: Vital signs in last 24 hours: Temp:  [98.1 F (36.7 C)-98.9 F (37.2 C)] 98.5 F (36.9 C) (04/13 0955) Pulse Rate:  [78-82] 79 (04/13 0955) Resp:  [20] 20 (04/13 0955) BP: (117-144)/(62-76) 117/62 mmHg (04/13 0955) SpO2:  [95 %-97 %] 95 % (04/13 0955)  Intake/Output from previous day: 04/12 0701 - 04/13 0700 In: 960 [I.V.:960] Out: 2975 [Urine:2975] Intake/Output this shift: Total I/O In: 120 [P.O.:120] Out: 300 [Urine:300]  Abdomen distended some squeaky bowel sounds noted. Motor function good in lower extremities. Wound VAC in place and back  Lab Results: No results found for this basename: WBC, HGB, HCT, PLT,  in the last 72 hours BMET No results found for this basename: NA, K, CL, CO2, GLUCOSE, BUN, CREATININE, CALCIUM,  in the last 72 hours  Studies/Results: Dg Abd 2 Views  09-07-12  *RADIOLOGY REPORT*  Clinical Data: Abdominal distention.  ABDOMEN - 2 VIEW  Comparison: Plain films of the abdomen 08/26/2012 and 08/28/2012.  Findings: Diffuse gaseous distention of small and large bowel persists without marked change.  No free intraperitoneal air is identified.  IMPRESSION: No notable change in bowel gas pattern compatible with ileus.   Original Report Authenticated By: Holley Dexter, M.D.     Assessment/Plan: Persistent ileus with loops of small bowel on last x-ray from 2012-09-07. Will request GI medicine consult to help manage this problem.   LOS: 11 days  Will ask GI medicine to see patient.   Arthella Headings J 09/02/2012, 10:24 AM

## 2012-09-03 DIAGNOSIS — R197 Diarrhea, unspecified: Secondary | ICD-10-CM | POA: Diagnosis not present

## 2012-09-03 LAB — CBC WITH DIFFERENTIAL/PLATELET
Basophils Relative: 0 % (ref 0–1)
Eosinophils Absolute: 0.1 10*3/uL (ref 0.0–0.7)
Eosinophils Relative: 1 % (ref 0–5)
HCT: 37.6 % — ABNORMAL LOW (ref 39.0–52.0)
Hemoglobin: 13 g/dL (ref 13.0–17.0)
MCH: 26.7 pg (ref 26.0–34.0)
MCHC: 34.6 g/dL (ref 30.0–36.0)
MCV: 77.4 fL — ABNORMAL LOW (ref 78.0–100.0)
Monocytes Absolute: 0.8 10*3/uL (ref 0.1–1.0)
Monocytes Relative: 4 % (ref 3–12)
Neutrophils Relative %: 83 % — ABNORMAL HIGH (ref 43–77)

## 2012-09-03 LAB — BASIC METABOLIC PANEL
BUN: 7 mg/dL (ref 6–23)
Calcium: 8.6 mg/dL (ref 8.4–10.5)
Creatinine, Ser: 0.81 mg/dL (ref 0.50–1.35)
GFR calc Af Amer: >90 mL/min (ref >90)
GFR calc non Af Amer: 87 mL/min — ABNORMAL LOW (ref >90)
Glucose, Bld: 132 mg/dL — ABNORMAL HIGH (ref 70–99)

## 2012-09-03 MED ORDER — BOOST / RESOURCE BREEZE PO LIQD
1.0000 | Freq: Three times a day (TID) | ORAL | Status: DC
  Administered 2012-09-03 – 2012-09-06 (×8): 1 via ORAL

## 2012-09-03 NOTE — Progress Notes (Signed)
Physical Therapy Treatment Patient Details Name: [name] [name] MRN: [id number] DOB: July 03, 1940 Today's Date: 09/03/2012 Time: 4098-1191 PT Time Calculation (min): 20 min  PT Assessment / Plan / Recommendation Comments on Treatment Session  Pt agreeable to thearpy and seems highly motivated to get better today. Pt reports he has had diarrhea all morning; deferred amb into hallway; amb within room today. Pt able to recall 2/3 back precautions indpendently; requires vc's during session to adhere to precautions. Will cont to f/u with pt to maximize functional mobility.  cont to recommend ST SNF at D/C to increase independence and return to PLOF.    Follow Up Recommendations  SNF     Does the patient have the potential to tolerate intense rehabilitation     Barriers to Discharge        Equipment Recommendations  Rolling walker with 5" wheels    Recommendations for Other Services    Frequency Min 3X/week   Plan Discharge plan remains appropriate;Frequency remains appropriate    Precautions / Restrictions Precautions Precautions: Fall;Back Precaution Comments: lumbar wound vac; pt able to recall 2/3 back precautions independently  Restrictions Weight Bearing Restrictions: No   Pertinent Vitals/Pain 8/10 at end of session; pt repositioned in bed for comfort. States pain "isnt that bad".    Mobility  Bed Mobility Bed Mobility: Rolling Left;Left Sidelying to Sit;Sitting - Scoot to Edge of Bed Rolling Left: 5: Supervision;With rail Right Sidelying to Sit: 5: Supervision;With rails Left Sidelying to Sit: 6: Modified independent (Device/Increase time);With rails Details for Bed Mobility Assistance: Pt required vc's to adhere to back precautions and perform log roll technique for supine to sit tansfer; pt required increased time and rail to complete task  Transfers Transfers: Sit to Stand;Stand to Sit Sit to Stand: 4: Min guard;From bed;With upper extremity assist;From elevated  surface Stand to Sit: 5: Supervision;To chair/3-in-1;With armrests Details for Transfer Assistance: min guard to steady pt; vc's for hand placement and to not pull up from sit to stand from RW; requires elevated bed Ambulation/Gait Ambulation/Gait Assistance: 4: Min guard Ambulation Distance (Feet): 40 Feet Assistive device: Rolling walker Ambulation/Gait Assistance Details: cues for RW safety and min guard to steady secondary to unsteadiness; pt c/o dizziness intially; dizziness subsided in < 30 seconds; cues for upright posture Gait Pattern: Step-through pattern;Shuffle;Wide base of support Gait velocity: decreased Stairs: No Wheelchair Mobility Wheelchair Mobility: No    Exercises General Exercises - Lower Extremity Ankle Circles/Pumps: AROM;Both;10 reps;Seated Long Arc Quad: AROM;Both;10 reps;Seated Hip Flexion/Marching: AROM;Both;10 reps;Seated   PT Diagnosis:    PT Problem List:   PT Treatment Interventions:     PT Goals Acute Rehab PT Goals PT Goal Formulation: With patient Time For Goal Achievement: 09/06/12 Potential to Achieve Goals: Good PT Goal: Rolling Supine to Left Side - Progress: Progressing toward goal PT Goal: Sit to Stand - Progress: Progressing toward goal PT Goal: Stand to Sit - Progress: Progressing toward goal PT Transfer Goal: Bed to Chair/Chair to Bed - Progress: Progressing toward goal PT Goal: Ambulate - Progress: Progressing toward goal Additional Goals PT Goal: Additional Goal #1 - Progress: Progressing toward goal  Visit Information  Last PT Received On: 09/03/12 Assistance Needed: +1    Subjective Data  Subjective: "I have had a bad couple days. I want to try and walk though." Patient Stated Goal: to feel better   Cognition  Cognition Overall Cognitive Status: Appears within functional limits for tasks assessed/performed Arousal/Alertness: Awake/alert Orientation Level: Appears intact for tasks assessed Behavior During  Session: [facility] for  tasks performed    Balance  Balance Balance Assessed: No  End of Session PT - End of Session Equipment Utilized During Treatment: Gait belt Activity Tolerance: Patient tolerated treatment well Patient left: in chair;with call bell/phone within reach Nurse Communication: Mobility status   GP     Donell Sievert, [place] [phone] 09/03/2012, 3:32 PM

## 2012-09-03 NOTE — Progress Notes (Signed)
Subjective: Patient reports Patient notes diarrhea today  Objective: Vital signs in last 24 hours: Temp:  [98.1 F (36.7 C)-99.2 F (37.3 C)] 99.2 F (37.3 C) (04/14 1800) Pulse Rate:  [89-99] 99 (04/14 1800) Resp:  [18-19] 18 (04/14 1800) BP: (122-148)/(65-73) 139/66 mmHg (04/14 1800) SpO2:  [90 %-97 %] 90 % (04/14 1800)  Intake/Output from previous day: 04/13 0701 - 04/14 0700 In: 2402.7 [P.O.:580; I.V.:1822.7] Out: 2875 [Urine:2875] Intake/Output this shift: Total I/O In: 960 [P.O.:960] Out: 800 [Urine:800]  Abdomen slightly softer but still feels distended. Back pain claimed to be a 7 of 10  Lab Results:  Recent Labs  09/02/12 1044 09/03/12 0544  WBC 20.4* 17.4*  HGB 14.3 13.0  HCT 40.9 37.6*  PLT 155 157   BMET  Recent Labs  09/02/12 1044 09/03/12 0544  NA 133* 135  K 3.9 3.1*  CL 96 97  CO2 29 33*  GLUCOSE 150* 132*  BUN 8 7  CREATININE 0.71 0.81  CALCIUM 9.3 8.6    Studies/Results: No results found.  Assessment/Plan: Stable postop.  LOS: 12 days  Remove wound VAC   Vlad Mayberry J 09/03/2012, 6:18 PM

## 2012-09-03 NOTE — Progress Notes (Signed)
[facility] Progress Note  Subjective: Having a lot of diarrhea today. Miralax started yesterday. RN reports that she thinks his abdomen is less distended today.   Objective: Vital signs in last 24 hours: Temp:  [98 F (36.7 C)-98.7 F (37.1 C)] 98.3 F (36.8 C) (04/14 0606) Pulse Rate:  [85-96] 89 (04/14 0606) Resp:  [18-20] 19 (04/14 0606) BP: (115-133)/(64-73) 122/69 mmHg (04/14 0606) SpO2:  [93 %-97 %] 93 % (04/14 0606) Weight change:    PE  No distress.  Abdomen with bowel sounds. Soft. Some diffuse mild discomfort. Lab Results: Results for orders placed during the hospital encounter of 08/22/12 (from the past 24 hour(s))  CBC WITH DIFFERENTIAL     Status: Abnormal   Collection Time    09/03/12  5:44 AM      Result Value Range   WBC 17.4 (*) 4.0 - 10.5 K/uL   RBC 4.86  4.22 - 5.81 MIL/uL   Hemoglobin 13.0  13.0 - 17.0 g/dL   HCT 96.0 (*) 45.4 - 09.8 %   MCV 77.4 (*) 78.0 - 100.0 fL   MCH 26.7  26.0 - 34.0 pg   MCHC 34.6  30.0 - 36.0 g/dL   RDW 11.9 (*) 14.7 - 82.9 %   Platelets 157  150 - 400 K/uL   Neutrophils Relative 83 (*) 43 - 77 %   Neutro Abs 14.4 (*) 1.7 - 7.7 K/uL   Lymphocytes Relative 12  12 - 46 %   Lymphs Abs 2.1  0.7 - 4.0 K/uL   Monocytes Relative 4  3 - 12 %   Monocytes Absolute 0.8  0.1 - 1.0 K/uL   Eosinophils Relative 1  0 - 5 %   Eosinophils Absolute 0.1  0.0 - 0.7 K/uL   Basophils Relative 0  0 - 1 %   Basophils Absolute 0.0  0.0 - 0.1 K/uL  BASIC METABOLIC PANEL     Status: Abnormal   Collection Time    09/03/12  5:44 AM      Result Value Range   Sodium 135  135 - 145 mEq/L   Potassium 3.1 (*) 3.5 - 5.1 mEq/L   Chloride 97  96 - 112 mEq/L   CO2 33 (*) 19 - 32 mEq/L   Glucose, Bld 132 (*) 70 - 99 mg/dL   BUN 7  6 - 23 mg/dL   Creatinine, Ser 5.62  0.50 - 1.35 mg/dL   Calcium 8.6  8.4 - 13.0 mg/dL   GFR calc non Af Amer 87 (*) >90 mL/min   GFR calc Af Amer >90  >90 mL/min     Studies/Results: @RISRSLT24 @    Assessment: Ileus Diarrhea today.  Plan: Continue Reglan. Stop Miralax. Check stool for C dif.    GANEM,SALEM F 09/03/2012, 10:53 AM

## 2012-09-03 NOTE — Progress Notes (Signed)
INITIAL NUTRITION ASSESSMENT  DOCUMENTATION CODES Per approved criteria  -Morbid Obesity   INTERVENTION: Resource Breeze po TID, each supplement provides 250 kcal and 9 grams of protein.  NUTRITION DIAGNOSIS: Inadequate oral intake related to ileus as evidenced by limited diet.   Goal: Pt to meet >/= 90% of their estimated nutrition needs.   Monitor:  PO intake, weight trend, labs, supplement acceptance  Reason for Assessment: Rounds  72 y.o. male  Admitting Dx: Other spondylosis with radiculopathy, lumbar region  ASSESSMENT: Pt is s/p back surgery with a fairly long GI hx. Pt with hx of lactose intolerance and constipation. Pt with ileus post op. Pt started on Miralax now with diarrhea, miralax stopped, remains on reglan. Stool being sent for c diff.  Per pt he was having trouble with constipation PTA due to high dose pain meds. Reports no recent weight changes. Deferred Nutrition-Focused Physical Exam as pt was on bedside commode.   Height: Ht Readings from Last 1 Encounters:  08/22/12 5\' 8"  (1.727 m)    Weight: Wt Readings from Last 1 Encounters:  08/22/12 285 lb 4.4 oz (129.4 kg)  No new weight this admission  Ideal Body Weight: 70 kg  % Ideal Body Weight: 185%  Wt Readings from Last 10 Encounters:  08/22/12 285 lb 4.4 oz (129.4 kg)  08/22/12 285 lb 4.4 oz (129.4 kg)  08/21/12 284 lb 2.8 oz (128.9 kg)  07/19/12 280 lb (127.007 kg)  07/03/12 283 lb 3.2 oz (128.459 kg)  06/17/12 278 lb 7.1 oz (126.3 kg)  03/14/12 283 lb 9.6 oz (128.64 kg)  03/02/12 286 lb 1.6 oz (129.774 kg)  01/17/12 286 lb 12.8 oz (130.092 kg)  12/30/11 305 lb (138.347 kg)    Usual Body Weight: 285 lb  % Usual Body Weight: 100%  BMI:  Body mass index is 43.39 kg/(m^2). Extreme Obesity Class III  Estimated Nutritional Needs: Kcal: 2100-2300 Protein: 110-130 grams Fluid: >2.1 L/day  Skin: incision  Diet Order: Clear Liquid Meal Completion: 100%   EDUCATION NEEDS: -No  education needs identified at this time   Intake/Output Summary (Last 24 hours) at 09/03/12 1102 Last data filed at 09/03/12 0833  Gross per 24 hour  Intake 2522.67 ml  Output   2975 ml  Net -452.33 ml    Last BM: 4/14 diarrhea   Labs:   Recent Labs Lab 09/02/12 1044 09/03/12 0544  NA 133* 135  K 3.9 3.1*  CL 96 97  CO2 29 33*  BUN 8 7  CREATININE 0.71 0.81  CALCIUM 9.3 8.6  MG 1.9  --   PHOS 2.6  --   GLUCOSE 150* 132*    CBG (last 3)  No results found for this basename: GLUCAP,  in the last 72 hours  Scheduled Meds: . dorzolamide-timolol  1 drop Both Eyes BID  . doxycycline  100 mg Oral BID  . furosemide  80 mg Oral BID  . latanoprost  1 drop Both Eyes QHS  . loratadine  10 mg Oral Daily  . metoCLOPramide (REGLAN) injection  5 mg Intravenous Q6H  . metoprolol succinate  12.5 mg Oral q morning - 10a  . pantoprazole (PROTONIX) IV  40 mg Intravenous Q12H  . polyethylene glycol  17 g Oral BID  . polyvinyl alcohol  1 drop Both Eyes Daily  . potassium chloride SA  20 mEq Oral BID  . senna  1 tablet Oral BID  . sodium chloride  3 mL Intravenous Q12H  . tamsulosin  0.4 mg Oral QPC supper    Continuous Infusions: . sodium chloride 20 mL (08/24/12 0826)  . 0.9 % NaCl with KCl 20 mEq / L 80 mL/hr (09/03/12 0547)    Past Medical History  Diagnosis Date  . CHF (congestive heart failure)   . Obesity   . Hyperlipidemia   . PUD (peptic ulcer disease)   . GERD (gastroesophageal reflux disease)   . S/P endoscopy 2007, 2012    2007: nl, May 2012: antral erosions  . Osteoarthritis   . S/P colonoscopy 2007, Feb and March 2012    hx of adenomas, left-sided diverticula, On 07/2010 TCS, fresh blood and clot noted coming from TI.  Marland Kitchen Anxiety   . Thyroid disease     hypothyroid  . Ringing in ears   . History of kidney stones   . History of bladder infections   . Heel spur     right heel  . Collagen vascular disease     history of venous insufficency and LE  cellulitis  . Thrombocytopenia 12/30/2011    Stable  . BPH (benign prostatic hyperplasia)   . Sleep apnea     uses oxygen cannot tolerate CPAP  . Hypertension     Sees Dr. Rennis Golden  . Bilateral lower leg cellulitis     Past Surgical History  Procedure Laterality Date  . Knee arthroscopy    . Rotator cuff repair    . Cholecystectomy    . Lumbar spine surgery    . Small bowel capsule study  07/2011    ?transient focal ischemia in distal small bowel to explain GI bleeding  . Esophagogastroduodenoscopy  Aug 2013    [facility], single 5mm pedunculated polyp otherwise normal. HYPERPLASTIC, NEGATIVE h.pylori  . Cardiac catheterization      02/27/07: no sign CAD, LVH, mod pulm HTN    Kendell Bane RD, LDN, CNSC [phone] Pager [phone] After Hours Pager

## 2012-09-04 ENCOUNTER — Encounter: Payer: Self-pay | Admitting: Internal Medicine

## 2012-09-04 DIAGNOSIS — R197 Diarrhea, unspecified: Secondary | ICD-10-CM | POA: Diagnosis not present

## 2012-09-04 NOTE — Progress Notes (Signed)
Occupational Therapy Treatment Patient Details Name: [name] [name] MRN: [id number] DOB: 02/21/1941 Today's Date: 09/04/2012 Time: 7829-5621 OT Time Calculation (min): 25 min  OT Assessment / Plan / Recommendation Comments on Treatment Session Pt making progress and should continue with OT services to maximize level of function and safety to return home    Follow Up Recommendations  SNF    Barriers to Discharge   Wife unable to provide adequate care and assist needed at home at this time    Equipment Recommendations  3 in 1 bedside comode;Wheelchair (measurements OT);Wheelchair cushion (measurements OT)    Recommendations for Other Services    Frequency Min 2X/week   Plan Discharge plan remains appropriate    Precautions / Restrictions Precautions Precautions: Fall;Back Precaution Comments: lumbar wound vac; pt able to recall 3/3 back precautions independently  Restrictions Weight Bearing Restrictions: No   Pertinent Vitals/Pain Abdominal discomfort/nausea    ADL  Grooming: Performed;Wash/dry hands;Wash/dry face;Min guard Where Assessed - Grooming: Supported standing Lower Body Bathing: Moderate assistance Lower Body Dressing: Maximal assistance Toilet Transfer: Performed;Supervision/safety;Min guard Toilet Transfer Method: Sit to stand Toilet Transfer Equipment: Raised toilet seat with arms (or 3-in-1 over toilet) Toileting - Clothing Manipulation and Hygiene: Minimal assistance Where Assessed - Toileting Clothing Manipulation and Hygiene: Standing Equipment Used: Rolling walker;Gait belt (3 in 1, tub bench) ADL Comments: Pt required assist to hold urinal while standing at EOB with min guard A for urination, pt provided with education and demo of tub bench for use at home    OT Diagnosis:    OT Problem List:   OT Treatment Interventions:     OT Goals ADL Goals ADL Goal: Lower Body Bathing - Progress: Progressing toward goals ADL Goal: Lower Body Dressing -  Progress: Progressing toward goals ADL Goal: Toileting - Clothing Manipulation - Progress: Progressing toward goals Miscellaneous OT Goals OT Goal: Miscellaneous Goal #1 - Progress: Progressing toward goals  Visit Information  Last OT Received On: 09/04/12    Subjective Data  Subjective: " I don;t feel good, but I will work with you cause I want to go home " Patient Stated Goal: To return home   Prior Functioning       Cognition  Cognition Overall Cognitive Status: Appears within functional limits for tasks assessed/performed Arousal/Alertness: Awake/alert Orientation Level: Appears intact for tasks assessed Behavior During Session: [facility] for tasks performed    Mobility  Bed Mobility Bed Mobility: Rolling Left;Left Sidelying to Sit;Sitting - Scoot to Edge of Bed Rolling Left: 5: Supervision;With rail Right Sidelying to Sit: 5: Supervision;With rails Left Sidelying to Sit: 6: Modified independent (Device/Increase time);With rails Sitting - Scoot to Edge of Bed: 5: Supervision Sit to Sidelying Right: 4: Min assist;HOB flat Transfers Transfers: Sit to Stand;Stand to Sit Sit to Stand: 4: Min guard;From bed;With upper extremity assist;From elevated surface;From toilet;From chair/3-in-1 Stand to Sit: To chair/3-in-1;With armrests;4: Min guard;To toilet    Exercises      Balance Balance Balance Assessed: No   End of Session OT - End of Session Equipment Utilized During Treatment: Gait belt;Other (comment) (RW, 3 in 1, tub bench) Activity Tolerance: Patient limited by fatigue Patient left: in chair;with call bell/phone within reach  GO     Galen Manila 09/04/2012, 4:34 PM

## 2012-09-04 NOTE — Progress Notes (Signed)
Pt's wound vac has been d/c as ordered, incision is dry, pt has been repositioned on her side, incision is open to air as ordered. ------Lillyonna Armstead, rn

## 2012-09-04 NOTE — Progress Notes (Signed)
[facility] Progress Note  Subjective: He feels better, although he feels that his abdomen is still distended but not as bad as it was. His diarrhea has stopped. He is still using pain medications. He is trying to ambulate more.  Objective: Vital signs in last 24 hours: Temp:  [98 F (36.7 C)-99.2 F (37.3 C)] 98 F (36.7 C) (04/15 0600) Pulse Rate:  [82-110] 93 (04/15 1025) Resp:  [18] 18 (04/15 1025) BP: (106-148)/(46-89) 106/59 mmHg (04/15 1025) SpO2:  [90 %-98 %] 94 % (04/15 1025) Weight change:    PE:  He is in no distress  Abdomen: Bowel sounds are present, it is distended but not hard, it is tympanitic, not tender  Lab Results: No results found for this or any previous visit (from the past 24 hour(s)).  Studies/Results: @RISRSLT24 @    Assessment: Ileus  Plan: Continue supportive care. Ambulate as frequently as possible. Decreased use of narcotic pain medications which can adversely affect and ileus.    Graylin Shiver 09/04/2012, 1:05 PM  Lab Results  Component Value Date   HGB 13.0 09/03/2012   HGB 14.3 09/02/2012   HGB 14.6 08/21/2012   HCT 37.6* 09/03/2012   HCT 40.9 09/02/2012   HCT 43.4 08/21/2012   ALKPHOS 86 09/02/2012   ALKPHOS 78 02/28/2012   ALKPHOS 67 02/27/2012   AST 14 09/02/2012   AST 14 02/28/2012   AST 14 02/27/2012   ALT 8 09/02/2012   ALT 12 02/28/2012   ALT 10 02/27/2012

## 2012-09-04 NOTE — Progress Notes (Signed)
Subjective: Patient reports Abdomen feels slightly better still doesn't feel  Objective: Vital signs in last 24 hours: Temp:  [98 F (36.7 C)-99.1 F (37.3 C)] 98.7 F (37.1 C) (04/15 1839) Pulse Rate:  [82-110] 91 (04/15 1839) Resp:  [18] 18 (04/15 1839) BP: (106-145)/(46-89) 118/56 mmHg (04/15 1839) SpO2:  [92 %-98 %] 95 % (04/15 1839)  Intake/Output from previous day: 04/14 0701 - 04/15 0700 In: 1877.3 [P.O.:960; I.V.:917.3] Out: 1275 [Urine:1275] Intake/Output this shift:    Motor function is good abdomen soft bowel sounds present  Lab Results:  Recent Labs  09/02/12 1044 09/03/12 0544  WBC 20.4* 17.4*  HGB 14.3 13.0  HCT 40.9 37.6*  PLT 155 157   BMET  Recent Labs  09/02/12 1044 09/03/12 0544  NA 133* 135  K 3.9 3.1*  CL 96 97  CO2 29 33*  GLUCOSE 150* 132*  BUN 8 7  CREATININE 0.71 0.81  CALCIUM 9.3 8.6    Studies/Results: No results found.  Assessment/Plan: Stable  LOS: 13 days  Continue observation consider transfer when ready   [name] [name] J 09/04/2012, 7:32 PM

## 2012-09-04 NOTE — Clinical Social Work Note (Signed)
Clinical Social Work   CSW confirmed bed at [facility], which is still available. CSW updated pt. CSW to continue to follow.   Dede Query, MSW, LCSW [phone]

## 2012-09-05 ENCOUNTER — Inpatient Hospital Stay (HOSPITAL_COMMUNITY): Payer: Medicare Other

## 2012-09-05 DIAGNOSIS — R109 Unspecified abdominal pain: Secondary | ICD-10-CM | POA: Diagnosis not present

## 2012-09-05 DIAGNOSIS — R197 Diarrhea, unspecified: Secondary | ICD-10-CM | POA: Diagnosis not present

## 2012-09-05 LAB — BASIC METABOLIC PANEL
CO2: 34 mEq/L — ABNORMAL HIGH (ref 19–32)
Calcium: 9 mg/dL (ref 8.4–10.5)
Chloride: 97 mEq/L (ref 96–112)
Sodium: 136 mEq/L (ref 135–145)

## 2012-09-05 MED ORDER — PANTOPRAZOLE SODIUM 40 MG PO TBEC
40.0000 mg | DELAYED_RELEASE_TABLET | Freq: Two times a day (BID) | ORAL | Status: DC
  Administered 2012-09-05 – 2012-09-06 (×2): 40 mg via ORAL
  Filled 2012-09-05 (×2): qty 1

## 2012-09-05 NOTE — Progress Notes (Signed)
Subjective: Patient reports Abdomen feels better patient has some back pain.  Objective: Vital signs in last 24 hours: Temp:  [97.8 F (36.6 C)-98.7 F (37.1 C)] 97.8 F (36.6 C) (04/16 0600) Pulse Rate:  [88-95] 95 (04/16 0600) Resp:  [18] 18 (04/16 0600) BP: (106-151)/(51-67) 144/51 mmHg (04/16 0600) SpO2:  [93 %-97 %] 97 % (04/16 0600)  Intake/Output from previous day:   Intake/Output this shift:    Incision clean dry some swelling in bed of incision.  Lab Results:  Recent Labs  09/02/12 1044 09/03/12 0544  WBC 20.4* 17.4*  HGB 14.3 13.0  HCT 40.9 37.6*  PLT 155 157   BMET  Recent Labs  09/02/12 1044 09/03/12 0544  NA 133* 135  K 3.9 3.1*  CL 96 97  CO2 29 33*  GLUCOSE 150* 132*  BUN 8 7  CREATININE 0.71 0.81  CALCIUM 9.3 8.6    Studies/Results: Dg Abd 1 View  09/05/2012  *RADIOLOGY REPORT*  Clinical Data: All pain.  ABDOMEN - 1 VIEW  Comparison: 08/31/2012.  Findings: Persistent dilation of small bowel is present.  No gross plain film evidence of free air.  There is no bowel gas identified in the rectal vault.  Paucity of gas is identified within the colon. Maximal diameter small bowel loops is 53 mm.  Compared yesterday's exam, the colon has decompressed.  IMPRESSION: Persistent dilation of small bowel loops, with decompression of the colon.  Findings may represent ileus which is favored over obstruction based on prior examinations.   Original Report Authenticated By: Andreas Newport, M.D.     Assessment/Plan: Abdominal x-ray demonstrates persistently dilated loops of small bowel.  LOS: 14 days  Await recommendations of GI medicine. Ready for discharge will   Stefani Dama 09/05/2012, 9:37 AM

## 2012-09-05 NOTE — Progress Notes (Signed)
Physical Therapy Treatment Patient Details Name: [name] [name] MRN: [id number] DOB: 1940/09/26 Today's Date: 09/05/2012 Time: 4098-1191 PT Time Calculation (min): 20 min  PT Assessment / Plan / Recommendation Comments on Treatment Session  Agreeable to ambulation. Excited about possibly going to SNF tomorrow.     Follow Up Recommendations  SNF     Does the patient have the potential to tolerate intense rehabilitation     Barriers to Discharge        Equipment Recommendations  Rolling walker with 5" wheels    Recommendations for Other Services    Frequency Min 3X/week   Plan Discharge plan remains appropriate;Frequency remains appropriate    Precautions / Restrictions Precautions Precautions: Fall;Back Precaution Booklet Issued: Yes (comment) Precaution Comments: recalling 1/3 precautions   Pertinent Vitals/Pain Reports pain in abdomen and soreness in his back from radiology this morning    Mobility  Bed Mobility Bed Mobility: Sit to Sidelying Left Sit to Sidelying Left: 5: Supervision;HOB flat Details for Bed Mobility Assistance: cues for technique and back precuations Transfers Transfers: Sit to Stand;Stand to Sit Sit to Stand: 5: Supervision;With upper extremity assist;From bed Stand to Sit: 4: Min guard;With upper extremity assist;To chair/3-in-1 Details for Transfer Assistance: supervision for safety, slow to rise but good stability Ambulation/Gait Ambulation/Gait Assistance: 4: Min guard Ambulation Distance (Feet): 200 Feet Assistive device: Rolling walker Ambulation/Gait Assistance Details: cues for tall posture and safe positioning within RW, no dizziness today, still with increased pain; cues not to arch  Gait Pattern: Step-through pattern;Shuffle;Wide base of support Gait velocity: decreased General Gait Details: decreased step height       PT Goals Acute Rehab PT Goals Pt will go Sit to Stand: with modified independence PT Goal: Sit to Stand -  Progress: Updated due to goal met Pt will go Stand to Sit: with modified independence PT Goal: Stand to Sit - Progress: Updated due to goals met PT Goal: Ambulate - Progress: Progressing toward goal Additional Goals PT Goal: Additional Goal #1 - Progress: Progressing toward goal  Visit Information  Last PT Received On: 09/05/12 Assistance Needed: +1    Subjective Data  Subjective: Im ready to walk.  Patient Stated Goal: to rehab tomorrow   Cognition  Cognition Arousal/Alertness: Awake/alert Behavior During Therapy: WFL for tasks assessed/performed Overall Cognitive Status: Within Functional Limits for tasks assessed    Balance     End of Session PT - End of Session Equipment Utilized During Treatment: Gait belt Activity Tolerance: Patient tolerated treatment well Patient left: in chair;with call bell/phone within reach   GP     [facility] HELEN 09/05/2012, 4:38 PM

## 2012-09-05 NOTE — Progress Notes (Signed)
Occupational Therapy Treatment Patient Details Name: [name] [name] MRN: [id number] DOB: 09/02/1940 Today's Date: 09/05/2012 Time: 4098-1191 OT Time Calculation (min): 14 min  OT Assessment / Plan / Recommendation Comments on Treatment Session Progressing toward goals.  Anticipate d/c to SNF tomorrow.    Follow Up Recommendations  SNF    Barriers to Discharge       Equipment Recommendations  3 in 1 bedside comode;Wheelchair (measurements OT)   Recommendations for Other Services    Frequency Min 2X/week   Plan Discharge plan remains appropriate    Precautions / Restrictions Precautions Precautions: Fall;Back Precaution Booklet Issued: Yes (comment) Precaution Comments: pt verbalized 2/3 back precautions   Pertinent Vitals/Pain See vitals    ADL  Eating/Feeding: Performed;Independent Where Assessed - Eating/Feeding: Chair Grooming: Performed;Wash/dry hands;Supervision/safety Where Assessed - Grooming: Unsupported standing Toilet Transfer: Simulated;Min Pension scheme manager Method: Sit to Barista:  (chair) Equipment Used: Rolling walker;Gait belt Transfers/Ambulation Related to ADLs: min guard with RW ADL Comments: Session limited due to arrival of meal tray.  Pt ambulated to sink to wash hands before eating.  Educated pt on back precautions and how these affect his ADL tasks.  Pt requesting to return to chair in order to eat meal.    OT Diagnosis:    OT Problem List:   OT Treatment Interventions:     OT Goals Acute Rehab OT Goals OT Goal Formulation: With patient Time For Goal Achievement: 09/12/12 Potential to Achieve Goals: Good ADL Goals Pt Will Perform Lower Body Bathing: with set-up;Sit to stand from bed;Sit to stand from chair;with adaptive equipment ADL Goal: Lower Body Bathing - Progress:  (continue with goal) Pt Will Perform Lower Body Dressing: with set-up;Sit to stand from chair;Sit to stand from bed;with adaptive equipment ADL  Goal: Lower Body Dressing - Progress:  (continue with goal) Pt Will Transfer to Toilet: with supervision;Ambulation;with DME;Comfort height toilet;Maintaining back safety precautions ADL Goal: Toilet Transfer - Progress: Progressing toward goals Pt Will Perform Toileting - Clothing Manipulation: with supervision;Standing ADL Goal: Toileting - Clothing Manipulation - Progress:  (continue with goal) Pt Will Perform Toileting - Hygiene: with supervision;Standing at 3-in-1/toilet ADL Goal: Toileting - Hygiene - Progress:  (continue with goal) Miscellaneous OT Goals Miscellaneous OT Goal #1: Pt will perform bed mobility at supervision level as precursor for EOB ADLs. OT Goal: Miscellaneous Goal #1 - Progress:  (continue with goal)  Visit Information  Last OT Received On: 09/05/12 Assistance Needed: +1    Subjective Data      Prior Functioning       Cognition  Cognition Arousal/Alertness: Awake/alert Behavior During Therapy: WFL for tasks assessed/performed Overall Cognitive Status: Within Functional Limits for tasks assessed    Mobility  Bed Mobility Bed Mobility: Not assessed Sit to Sidelying Left: 5: Supervision;HOB flat Details for Bed Mobility Assistance: cues for technique and back precuations Transfers Transfers: Sit to Stand;Stand to Sit Sit to Stand: 4: Min guard;From chair/3-in-1;With upper extremity assist;With armrests Stand to Sit: 4: Min guard;To chair/3-in-1;With armrests;With upper extremity assist Details for Transfer Assistance: min guard for safety. Increase time.    Exercises      Balance     End of Session OT - End of Session Equipment Utilized During Treatment: Gait belt Activity Tolerance: Patient tolerated treatment well Patient left: in chair;with call bell/phone within reach  GO   09/05/2012 Cipriano Mile OTR/L Pager [phone] Office [phone]   Cipriano Mile 09/05/2012, 5:29 PM

## 2012-09-05 NOTE — Progress Notes (Signed)
[facility] Progress Note  Subjective: The patient states that he feels much better. He states that his abdomen feels almost back to normal. He is passing flatus. He would like to advance his diet.  Objective: Vital signs in last 24 hours: Temp:  [97.8 F (36.6 C)-98.7 F (37.1 C)] 98.1 F (36.7 C) (04/16 1033) Pulse Rate:  [88-97] 97 (04/16 1033) Resp:  [18] 18 (04/16 1033) BP: (103-151)/(49-67) 103/49 mmHg (04/16 1033) SpO2:  [93 %-98 %] 98 % (04/16 1033) Weight change:    PE:  He is in no distress  Abdomen: Good bowel sounds, soft, nontender  Abdominal x-ray still shows some findings of ileus in the small bowel but the colon has decompressed  Lab Results: No results found for this or any previous visit (from the past 24 hour(s)).  Studies/Results: @RISRSLT24 @    Assessment: Resolving ileus  Plan: Try to advance diet and see how he does clinically. Continue to ambulate. Avoid narcotics as much as possible. Recheck potassium    Gaines Cartmell F 09/05/2012, 12:15 PM

## 2012-09-06 DIAGNOSIS — M159 Polyosteoarthritis, unspecified: Secondary | ICD-10-CM | POA: Diagnosis not present

## 2012-09-06 DIAGNOSIS — Z981 Arthrodesis status: Secondary | ICD-10-CM | POA: Diagnosis not present

## 2012-09-06 DIAGNOSIS — E669 Obesity, unspecified: Secondary | ICD-10-CM | POA: Diagnosis not present

## 2012-09-06 DIAGNOSIS — Z5189 Encounter for other specified aftercare: Secondary | ICD-10-CM | POA: Diagnosis not present

## 2012-09-06 DIAGNOSIS — M6281 Muscle weakness (generalized): Secondary | ICD-10-CM | POA: Diagnosis not present

## 2012-09-06 DIAGNOSIS — B351 Tinea unguium: Secondary | ICD-10-CM | POA: Diagnosis not present

## 2012-09-06 DIAGNOSIS — R269 Unspecified abnormalities of gait and mobility: Secondary | ICD-10-CM | POA: Diagnosis not present

## 2012-09-06 DIAGNOSIS — R293 Abnormal posture: Secondary | ICD-10-CM | POA: Diagnosis not present

## 2012-09-06 DIAGNOSIS — M47817 Spondylosis without myelopathy or radiculopathy, lumbosacral region: Secondary | ICD-10-CM | POA: Diagnosis not present

## 2012-09-06 DIAGNOSIS — K219 Gastro-esophageal reflux disease without esophagitis: Secondary | ICD-10-CM | POA: Diagnosis not present

## 2012-09-06 DIAGNOSIS — Z4889 Encounter for other specified surgical aftercare: Secondary | ICD-10-CM | POA: Diagnosis not present

## 2012-09-06 DIAGNOSIS — I5032 Chronic diastolic (congestive) heart failure: Secondary | ICD-10-CM | POA: Diagnosis not present

## 2012-09-06 DIAGNOSIS — Q7649 Other congenital malformations of spine, not associated with scoliosis: Secondary | ICD-10-CM | POA: Diagnosis not present

## 2012-09-06 DIAGNOSIS — G4733 Obstructive sleep apnea (adult) (pediatric): Secondary | ICD-10-CM | POA: Diagnosis not present

## 2012-09-06 DIAGNOSIS — K2100 Gastro-esophageal reflux disease with esophagitis, without bleeding: Secondary | ICD-10-CM | POA: Diagnosis not present

## 2012-09-06 DIAGNOSIS — H2510 Age-related nuclear cataract, unspecified eye: Secondary | ICD-10-CM | POA: Diagnosis not present

## 2012-09-06 DIAGNOSIS — I509 Heart failure, unspecified: Secondary | ICD-10-CM | POA: Diagnosis not present

## 2012-09-06 DIAGNOSIS — Z4789 Encounter for other orthopedic aftercare: Secondary | ICD-10-CM | POA: Diagnosis not present

## 2012-09-06 DIAGNOSIS — Z8701 Personal history of pneumonia (recurrent): Secondary | ICD-10-CM | POA: Diagnosis not present

## 2012-09-06 DIAGNOSIS — R279 Unspecified lack of coordination: Secondary | ICD-10-CM | POA: Diagnosis not present

## 2012-09-06 DIAGNOSIS — M549 Dorsalgia, unspecified: Secondary | ICD-10-CM | POA: Diagnosis not present

## 2012-09-06 DIAGNOSIS — M545 Low back pain, unspecified: Secondary | ICD-10-CM | POA: Diagnosis not present

## 2012-09-06 DIAGNOSIS — M79609 Pain in unspecified limb: Secondary | ICD-10-CM | POA: Diagnosis not present

## 2012-09-06 DIAGNOSIS — H409 Unspecified glaucoma: Secondary | ICD-10-CM | POA: Diagnosis not present

## 2012-09-06 DIAGNOSIS — I10 Essential (primary) hypertension: Secondary | ICD-10-CM | POA: Diagnosis not present

## 2012-09-06 DIAGNOSIS — H4011X0 Primary open-angle glaucoma, stage unspecified: Secondary | ICD-10-CM | POA: Diagnosis not present

## 2012-09-06 MED ORDER — OXYCODONE-ACETAMINOPHEN 5-325 MG PO TABS: 1.0000 | ORAL_TABLET | ORAL | Status: DC | PRN

## 2012-09-06 NOTE — Clinical Social Work Note (Signed)
Clinical Social Worker facilitated patient discharge including contacting facility and confirming discharge with patient at bedside.  Patient agreeable to SNF placement today.  CSW arranging ambulance transportation via [facility] to [facility].  RN to call report prior to discharge.  Clinical Social Worker will sign off for now as social work intervention is no longer needed. Please consult [name] again if new need arises.  Macario Golds, [place] [phone]

## 2012-09-06 NOTE — Discharge Summary (Signed)
Physician Discharge Summary  Patient ID: [name] [name] MRN: [id number] DOB/AGE: 73-Sep-1942 73 y.o.  Admit date: 08/22/2012 Discharge date: 09/06/2012  Admission Diagnoses: Lumbar stenosis, herniated nucleus pulposus, instability, L4-L5  Discharge Diagnoses: Lumbar stenosis, herniated nucleus pulposus, lumbar radiculopathy, instability L4-L5, postoperative ileus. Principal Problem:   Other spondylosis with radiculopathy, lumbar region   Discharged Condition: good  Hospital Course: Patient was admitted to undergo surgical decompression and stabilization at L4-L5 secondary to severe spondylitic stenosis with a herniated nucleus pulposus [name] [name] had weakness in his lower extremities secondary to severe radiculopathy.  Postoperatively the patient experienced considerable difficulty with a postoperative ileus having severe nausea vomiting and constipation. He gradually recovered.  Consults: GI  Significant Diagnostic Studies: Postoperative laboratory studies including C. met be met and CBC.  Treatments: surgery: Decompression L4-L5 arthrodesis with allograft and pedicle screw fixation L4-L5.  Discharge Exam: Blood pressure 141/56, pulse 91, temperature 98.3 F (36.8 C), temperature source Oral, resp. rate 20, height 5\' 8"  (1.727 m), weight 129.4 kg (285 lb 4.4 oz), SpO2 91.00%. Motor function is intact in lower extremities incision is clean and dry. Moderate swelling in that if incision is without erythema.  Disposition: Skilled nursing facility  Discharge Orders   Future Appointments Provider Department Dept Phone   12/28/2012 10:30 AM [facility] [phone]   01/01/2013 10:30 AM Randall An, MD [facility] [phone]   Future Orders Complete By Expires     Call MD for:  redness, tenderness, or signs of infection (pain, swelling, redness, odor or green/yellow discharge around incision site)  As directed     Call MD for:  severe uncontrolled  pain  As directed     Call MD for:  temperature >100.4  As directed     Diet - low sodium heart healthy  As directed     Discharge instructions  As directed     Comments:      Change dressing daily. Paint incision with Betadine.    Increase activity slowly  As directed         Medication List    TAKE these medications       acetaminophen 325 MG tablet  Commonly known as:  TYLENOL  Take 650 mg by mouth every 6 (six) hours as needed for pain.     atorvastatin 80 MG tablet  Commonly known as:  LIPITOR  Take 80 mg by mouth at bedtime.     cetirizine 10 MG tablet  Commonly known as:  ZYRTEC  Take 10 mg by mouth daily.     cyclobenzaprine 10 MG tablet  Commonly known as:  FLEXERIL  Take 1 tablet (10 mg total) by mouth 3 (three) times daily as needed for muscle spasms.     docusate sodium 100 MG capsule  Commonly known as:  COLACE  Take 100 mg by mouth at bedtime.     Dorzolamide HCl-Timolol Mal PF 22.3-6.8 MG/ML Soln  Apply 1 drop to eye 2 (two) times daily. Dorzolamide HCI-Timolol Maleate Ophthalmic Solution 2.23%/0.68%     doxycycline 100 MG capsule  Commonly known as:  VIBRAMYCIN  Take 1 capsule (100 mg total) by mouth 2 (two) times daily.     furosemide 80 MG tablet  Commonly known as:  LASIX  Take 80 mg by mouth 2 (two) times daily.     latanoprost 0.005 % ophthalmic solution  Commonly known as:  XALATAN  Place 1 drop into both eyes at bedtime.  LUBRICANT EYE DROPS 0.4-0.3 % Soln  Generic drug:  Polyethyl Glycol-Propyl Glycol  Apply 1 drop to eye daily as needed (for lubricant eye drops).     metoprolol succinate 25 MG 24 hr tablet  Commonly known as:  TOPROL-XL  Take 12.5 mg by mouth every morning.     ondansetron 4 MG tablet  Commonly known as:  ZOFRAN  Take 1 tablet (4 mg total) by mouth every 8 (eight) hours as needed. For nausea     OVER THE COUNTER MEDICATION  Apply 1 application topically daily after breakfast. "OTC cream" for pain      oxyCODONE-acetaminophen 10-325 MG per tablet  Commonly known as:  PERCOCET  Take 1 tablet by mouth every 6 (six) hours as needed for pain.     oxyCODONE-acetaminophen 5-325 MG per tablet  Commonly known as:  ROXICET  Take 1 tablet by mouth every 4 (four) hours as needed for pain.     pantoprazole 40 MG tablet  Commonly known as:  PROTONIX  Take 40 mg by mouth 2 (two) times daily.     potassium chloride SA 20 MEQ tablet  Commonly known as:  K-DUR,KLOR-CON  Take 1 tablet (20 mEq total) by mouth 2 (two) times daily.     tamsulosin 0.4 MG Caps  Commonly known as:  FLOMAX  Take 0.4 mg by mouth daily after supper.           Follow-up Information   Follow up with CABBELL,KYLE L, MD. (call after release from [facility] to make an appointment)    Contact information:   [address]                         [address] [place] [address] [phone]       Signed: Stefani Dama 09/06/2012, 9:11 AM

## 2012-09-06 NOTE — Progress Notes (Signed)
Report has been called to the facility, pt ready waiting on [facility] for transportation. Assessments remained unchanged as at now.

## 2012-09-07 NOTE — Care Management Note (Signed)
    Page 1 of 1   09/07/2012     8:52:39 AM   CARE MANAGEMENT NOTE 09/07/2012  Patient:  [name] [name], [name] [name]   Account Number:  [id number]  Date Initiated:  08/23/2012  Documentation initiated by:  Donn Pierini  Subjective/Objective Assessment:   Pt admitted s/p lumbar fusion     Action/Plan:   PTA pt lived at home with spouse- PT/OT evals- pt has home O2 through [employer] in [place]. NCM to follow for PT/OT recommendations   Anticipated DC Date:  09/06/2012   Anticipated DC Plan:  SKILLED NURSING FACILITY  In-house referral  Clinical Social Worker      DC Planning Services  CM consult      Choice offered to / List presented to:             Status of service:  Completed, signed off Medicare Important Message given?   (If response is "NO", the following Medicare IM given date fields will be blank) Date Medicare IM given:   Date Additional Medicare IM given:    Discharge Disposition:  SKILLED NURSING FACILITY  Per UR Regulation:  Reviewed for med. necessity/level of care/duration of stay  If discussed at Long Length of Stay Meetings, dates discussed:   09/04/2012  09/06/2012    Comments:  08/28/2012 1530  NCM spoke to pt and he is requesting SNF for rehab. CSW made aware to follow up on SNF at dc. Pt states he does not have anyone in the home that can provide assistance. Wife can help but it is limited due to her medical conditions.   Isidoro Donning RN CCM Case Mgmt phone [phone]

## 2012-09-10 DIAGNOSIS — I509 Heart failure, unspecified: Secondary | ICD-10-CM | POA: Diagnosis not present

## 2012-09-18 DIAGNOSIS — K2100 Gastro-esophageal reflux disease with esophagitis, without bleeding: Secondary | ICD-10-CM | POA: Diagnosis not present

## 2012-09-25 DIAGNOSIS — H4011X0 Primary open-angle glaucoma, stage unspecified: Secondary | ICD-10-CM | POA: Diagnosis not present

## 2012-09-27 DIAGNOSIS — M545 Low back pain, unspecified: Secondary | ICD-10-CM | POA: Diagnosis not present

## 2012-09-27 DIAGNOSIS — M47817 Spondylosis without myelopathy or radiculopathy, lumbosacral region: Secondary | ICD-10-CM | POA: Diagnosis not present

## 2012-10-01 DIAGNOSIS — H4011X0 Primary open-angle glaucoma, stage unspecified: Secondary | ICD-10-CM | POA: Diagnosis not present

## 2012-10-01 DIAGNOSIS — H2510 Age-related nuclear cataract, unspecified eye: Secondary | ICD-10-CM | POA: Diagnosis not present

## 2012-10-01 DIAGNOSIS — H409 Unspecified glaucoma: Secondary | ICD-10-CM | POA: Diagnosis not present

## 2012-10-05 DIAGNOSIS — I509 Heart failure, unspecified: Secondary | ICD-10-CM | POA: Diagnosis not present

## 2012-10-05 DIAGNOSIS — Z4789 Encounter for other orthopedic aftercare: Secondary | ICD-10-CM | POA: Diagnosis not present

## 2012-10-05 DIAGNOSIS — I10 Essential (primary) hypertension: Secondary | ICD-10-CM | POA: Diagnosis not present

## 2012-10-05 DIAGNOSIS — I5032 Chronic diastolic (congestive) heart failure: Secondary | ICD-10-CM | POA: Diagnosis not present

## 2012-10-05 DIAGNOSIS — K219 Gastro-esophageal reflux disease without esophagitis: Secondary | ICD-10-CM | POA: Diagnosis not present

## 2012-10-05 DIAGNOSIS — M159 Polyosteoarthritis, unspecified: Secondary | ICD-10-CM | POA: Diagnosis not present

## 2012-10-08 DIAGNOSIS — M159 Polyosteoarthritis, unspecified: Secondary | ICD-10-CM | POA: Diagnosis not present

## 2012-10-08 DIAGNOSIS — K219 Gastro-esophageal reflux disease without esophagitis: Secondary | ICD-10-CM | POA: Diagnosis not present

## 2012-10-08 DIAGNOSIS — I509 Heart failure, unspecified: Secondary | ICD-10-CM | POA: Diagnosis not present

## 2012-10-08 DIAGNOSIS — I5032 Chronic diastolic (congestive) heart failure: Secondary | ICD-10-CM | POA: Diagnosis not present

## 2012-10-08 DIAGNOSIS — Z4789 Encounter for other orthopedic aftercare: Secondary | ICD-10-CM | POA: Diagnosis not present

## 2012-10-08 DIAGNOSIS — I10 Essential (primary) hypertension: Secondary | ICD-10-CM | POA: Diagnosis not present

## 2012-10-09 DIAGNOSIS — Z4789 Encounter for other orthopedic aftercare: Secondary | ICD-10-CM | POA: Diagnosis not present

## 2012-10-09 DIAGNOSIS — I10 Essential (primary) hypertension: Secondary | ICD-10-CM | POA: Diagnosis not present

## 2012-10-09 DIAGNOSIS — I509 Heart failure, unspecified: Secondary | ICD-10-CM | POA: Diagnosis not present

## 2012-10-09 DIAGNOSIS — I5032 Chronic diastolic (congestive) heart failure: Secondary | ICD-10-CM | POA: Diagnosis not present

## 2012-10-09 DIAGNOSIS — K219 Gastro-esophageal reflux disease without esophagitis: Secondary | ICD-10-CM | POA: Diagnosis not present

## 2012-10-09 DIAGNOSIS — M159 Polyosteoarthritis, unspecified: Secondary | ICD-10-CM | POA: Diagnosis not present

## 2012-10-10 DIAGNOSIS — I5032 Chronic diastolic (congestive) heart failure: Secondary | ICD-10-CM | POA: Diagnosis not present

## 2012-10-10 DIAGNOSIS — I10 Essential (primary) hypertension: Secondary | ICD-10-CM | POA: Diagnosis not present

## 2012-10-10 DIAGNOSIS — Z4789 Encounter for other orthopedic aftercare: Secondary | ICD-10-CM | POA: Diagnosis not present

## 2012-10-10 DIAGNOSIS — I509 Heart failure, unspecified: Secondary | ICD-10-CM | POA: Diagnosis not present

## 2012-10-10 DIAGNOSIS — K219 Gastro-esophageal reflux disease without esophagitis: Secondary | ICD-10-CM | POA: Diagnosis not present

## 2012-10-10 DIAGNOSIS — M159 Polyosteoarthritis, unspecified: Secondary | ICD-10-CM | POA: Diagnosis not present

## 2012-10-11 DIAGNOSIS — I10 Essential (primary) hypertension: Secondary | ICD-10-CM | POA: Diagnosis not present

## 2012-10-11 DIAGNOSIS — I509 Heart failure, unspecified: Secondary | ICD-10-CM | POA: Diagnosis not present

## 2012-10-11 DIAGNOSIS — I5032 Chronic diastolic (congestive) heart failure: Secondary | ICD-10-CM | POA: Diagnosis not present

## 2012-10-11 DIAGNOSIS — K219 Gastro-esophageal reflux disease without esophagitis: Secondary | ICD-10-CM | POA: Diagnosis not present

## 2012-10-11 DIAGNOSIS — Z4789 Encounter for other orthopedic aftercare: Secondary | ICD-10-CM | POA: Diagnosis not present

## 2012-10-11 DIAGNOSIS — M159 Polyosteoarthritis, unspecified: Secondary | ICD-10-CM | POA: Diagnosis not present

## 2012-10-14 DIAGNOSIS — R0989 Other specified symptoms and signs involving the circulatory and respiratory systems: Secondary | ICD-10-CM | POA: Diagnosis not present

## 2012-10-14 DIAGNOSIS — R51 Headache: Secondary | ICD-10-CM | POA: Diagnosis not present

## 2012-10-14 DIAGNOSIS — I10 Essential (primary) hypertension: Secondary | ICD-10-CM | POA: Diagnosis not present

## 2012-10-14 DIAGNOSIS — E876 Hypokalemia: Secondary | ICD-10-CM | POA: Diagnosis not present

## 2012-10-14 DIAGNOSIS — Z79899 Other long term (current) drug therapy: Secondary | ICD-10-CM | POA: Diagnosis not present

## 2012-10-14 DIAGNOSIS — R0602 Shortness of breath: Secondary | ICD-10-CM | POA: Diagnosis not present

## 2012-10-14 DIAGNOSIS — R030 Elevated blood-pressure reading, without diagnosis of hypertension: Secondary | ICD-10-CM | POA: Diagnosis not present

## 2012-10-14 DIAGNOSIS — R609 Edema, unspecified: Secondary | ICD-10-CM | POA: Diagnosis not present

## 2012-10-16 DIAGNOSIS — I10 Essential (primary) hypertension: Secondary | ICD-10-CM | POA: Diagnosis not present

## 2012-10-16 DIAGNOSIS — Z4789 Encounter for other orthopedic aftercare: Secondary | ICD-10-CM | POA: Diagnosis not present

## 2012-10-16 DIAGNOSIS — M159 Polyosteoarthritis, unspecified: Secondary | ICD-10-CM | POA: Diagnosis not present

## 2012-10-16 DIAGNOSIS — K219 Gastro-esophageal reflux disease without esophagitis: Secondary | ICD-10-CM | POA: Diagnosis not present

## 2012-10-16 DIAGNOSIS — I5032 Chronic diastolic (congestive) heart failure: Secondary | ICD-10-CM | POA: Diagnosis not present

## 2012-10-16 DIAGNOSIS — I509 Heart failure, unspecified: Secondary | ICD-10-CM | POA: Diagnosis not present

## 2012-10-17 DIAGNOSIS — I509 Heart failure, unspecified: Secondary | ICD-10-CM | POA: Diagnosis not present

## 2012-10-17 DIAGNOSIS — I5032 Chronic diastolic (congestive) heart failure: Secondary | ICD-10-CM | POA: Diagnosis not present

## 2012-10-17 DIAGNOSIS — M159 Polyosteoarthritis, unspecified: Secondary | ICD-10-CM | POA: Diagnosis not present

## 2012-10-17 DIAGNOSIS — K219 Gastro-esophageal reflux disease without esophagitis: Secondary | ICD-10-CM | POA: Diagnosis not present

## 2012-10-17 DIAGNOSIS — I10 Essential (primary) hypertension: Secondary | ICD-10-CM | POA: Diagnosis not present

## 2012-10-17 DIAGNOSIS — Z4789 Encounter for other orthopedic aftercare: Secondary | ICD-10-CM | POA: Diagnosis not present

## 2012-10-18 DIAGNOSIS — Z4789 Encounter for other orthopedic aftercare: Secondary | ICD-10-CM | POA: Diagnosis not present

## 2012-10-18 DIAGNOSIS — K219 Gastro-esophageal reflux disease without esophagitis: Secondary | ICD-10-CM | POA: Diagnosis not present

## 2012-10-18 DIAGNOSIS — M159 Polyosteoarthritis, unspecified: Secondary | ICD-10-CM | POA: Diagnosis not present

## 2012-10-18 DIAGNOSIS — I10 Essential (primary) hypertension: Secondary | ICD-10-CM | POA: Diagnosis not present

## 2012-10-18 DIAGNOSIS — I509 Heart failure, unspecified: Secondary | ICD-10-CM | POA: Diagnosis not present

## 2012-10-18 DIAGNOSIS — I5032 Chronic diastolic (congestive) heart failure: Secondary | ICD-10-CM | POA: Diagnosis not present

## 2012-10-22 DIAGNOSIS — L03119 Cellulitis of unspecified part of limb: Secondary | ICD-10-CM | POA: Diagnosis not present

## 2012-10-22 DIAGNOSIS — L02419 Cutaneous abscess of limb, unspecified: Secondary | ICD-10-CM | POA: Diagnosis not present

## 2012-10-22 DIAGNOSIS — I10 Essential (primary) hypertension: Secondary | ICD-10-CM | POA: Diagnosis not present

## 2012-10-22 DIAGNOSIS — Z6841 Body Mass Index (BMI) 40.0 and over, adult: Secondary | ICD-10-CM | POA: Diagnosis not present

## 2012-10-22 DIAGNOSIS — E039 Hypothyroidism, unspecified: Secondary | ICD-10-CM | POA: Diagnosis not present

## 2012-10-23 DIAGNOSIS — I10 Essential (primary) hypertension: Secondary | ICD-10-CM | POA: Diagnosis not present

## 2012-10-23 DIAGNOSIS — K219 Gastro-esophageal reflux disease without esophagitis: Secondary | ICD-10-CM | POA: Diagnosis not present

## 2012-10-23 DIAGNOSIS — Z4789 Encounter for other orthopedic aftercare: Secondary | ICD-10-CM | POA: Diagnosis not present

## 2012-10-23 DIAGNOSIS — I509 Heart failure, unspecified: Secondary | ICD-10-CM | POA: Diagnosis not present

## 2012-10-23 DIAGNOSIS — I5032 Chronic diastolic (congestive) heart failure: Secondary | ICD-10-CM | POA: Diagnosis not present

## 2012-10-23 DIAGNOSIS — M159 Polyosteoarthritis, unspecified: Secondary | ICD-10-CM | POA: Diagnosis not present

## 2012-10-24 DIAGNOSIS — I5032 Chronic diastolic (congestive) heart failure: Secondary | ICD-10-CM | POA: Diagnosis not present

## 2012-10-24 DIAGNOSIS — M159 Polyosteoarthritis, unspecified: Secondary | ICD-10-CM | POA: Diagnosis not present

## 2012-10-24 DIAGNOSIS — Z4789 Encounter for other orthopedic aftercare: Secondary | ICD-10-CM | POA: Diagnosis not present

## 2012-10-24 DIAGNOSIS — K219 Gastro-esophageal reflux disease without esophagitis: Secondary | ICD-10-CM | POA: Diagnosis not present

## 2012-10-24 DIAGNOSIS — I509 Heart failure, unspecified: Secondary | ICD-10-CM | POA: Diagnosis not present

## 2012-10-24 DIAGNOSIS — I10 Essential (primary) hypertension: Secondary | ICD-10-CM | POA: Diagnosis not present

## 2012-10-25 DIAGNOSIS — I509 Heart failure, unspecified: Secondary | ICD-10-CM | POA: Diagnosis not present

## 2012-10-25 DIAGNOSIS — I5032 Chronic diastolic (congestive) heart failure: Secondary | ICD-10-CM | POA: Diagnosis not present

## 2012-10-25 DIAGNOSIS — K219 Gastro-esophageal reflux disease without esophagitis: Secondary | ICD-10-CM | POA: Diagnosis not present

## 2012-10-25 DIAGNOSIS — M159 Polyosteoarthritis, unspecified: Secondary | ICD-10-CM | POA: Diagnosis not present

## 2012-10-25 DIAGNOSIS — Z4789 Encounter for other orthopedic aftercare: Secondary | ICD-10-CM | POA: Diagnosis not present

## 2012-10-25 DIAGNOSIS — I10 Essential (primary) hypertension: Secondary | ICD-10-CM | POA: Diagnosis not present

## 2012-10-30 DIAGNOSIS — K219 Gastro-esophageal reflux disease without esophagitis: Secondary | ICD-10-CM | POA: Diagnosis not present

## 2012-10-30 DIAGNOSIS — I509 Heart failure, unspecified: Secondary | ICD-10-CM | POA: Diagnosis not present

## 2012-10-30 DIAGNOSIS — I10 Essential (primary) hypertension: Secondary | ICD-10-CM | POA: Diagnosis not present

## 2012-10-30 DIAGNOSIS — M159 Polyosteoarthritis, unspecified: Secondary | ICD-10-CM | POA: Diagnosis not present

## 2012-10-30 DIAGNOSIS — I5032 Chronic diastolic (congestive) heart failure: Secondary | ICD-10-CM | POA: Diagnosis not present

## 2012-10-30 DIAGNOSIS — Z4789 Encounter for other orthopedic aftercare: Secondary | ICD-10-CM | POA: Diagnosis not present

## 2012-10-31 DIAGNOSIS — I509 Heart failure, unspecified: Secondary | ICD-10-CM | POA: Diagnosis not present

## 2012-10-31 DIAGNOSIS — Z4789 Encounter for other orthopedic aftercare: Secondary | ICD-10-CM | POA: Diagnosis not present

## 2012-10-31 DIAGNOSIS — I10 Essential (primary) hypertension: Secondary | ICD-10-CM | POA: Diagnosis not present

## 2012-10-31 DIAGNOSIS — I5032 Chronic diastolic (congestive) heart failure: Secondary | ICD-10-CM | POA: Diagnosis not present

## 2012-10-31 DIAGNOSIS — M159 Polyosteoarthritis, unspecified: Secondary | ICD-10-CM | POA: Diagnosis not present

## 2012-10-31 DIAGNOSIS — K219 Gastro-esophageal reflux disease without esophagitis: Secondary | ICD-10-CM | POA: Diagnosis not present

## 2012-11-07 DIAGNOSIS — I509 Heart failure, unspecified: Secondary | ICD-10-CM | POA: Diagnosis not present

## 2012-11-07 DIAGNOSIS — Z4789 Encounter for other orthopedic aftercare: Secondary | ICD-10-CM | POA: Diagnosis not present

## 2012-11-07 DIAGNOSIS — I10 Essential (primary) hypertension: Secondary | ICD-10-CM | POA: Diagnosis not present

## 2012-11-07 DIAGNOSIS — M159 Polyosteoarthritis, unspecified: Secondary | ICD-10-CM | POA: Diagnosis not present

## 2012-11-07 DIAGNOSIS — K219 Gastro-esophageal reflux disease without esophagitis: Secondary | ICD-10-CM | POA: Diagnosis not present

## 2012-11-07 DIAGNOSIS — I5032 Chronic diastolic (congestive) heart failure: Secondary | ICD-10-CM | POA: Diagnosis not present

## 2012-11-27 ENCOUNTER — Ambulatory Visit (INDEPENDENT_AMBULATORY_CARE_PROVIDER_SITE_OTHER): Payer: Medicare Other | Admitting: Internal Medicine

## 2012-11-27 ENCOUNTER — Encounter: Payer: Self-pay | Admitting: Internal Medicine

## 2012-11-27 VITALS — BP 120/70 | HR 80 | Ht 69.0 in | Wt 282.2 lb

## 2012-11-27 DIAGNOSIS — E669 Obesity, unspecified: Secondary | ICD-10-CM

## 2012-11-27 DIAGNOSIS — E785 Hyperlipidemia, unspecified: Secondary | ICD-10-CM | POA: Diagnosis not present

## 2012-11-27 DIAGNOSIS — I10 Essential (primary) hypertension: Secondary | ICD-10-CM

## 2012-11-27 DIAGNOSIS — I50810 Right heart failure, unspecified: Secondary | ICD-10-CM | POA: Insufficient documentation

## 2012-11-27 DIAGNOSIS — I509 Heart failure, unspecified: Secondary | ICD-10-CM | POA: Diagnosis not present

## 2012-11-27 DIAGNOSIS — G4733 Obstructive sleep apnea (adult) (pediatric): Secondary | ICD-10-CM | POA: Insufficient documentation

## 2012-11-27 NOTE — Patient Instructions (Signed)
Your physician wants you to follow-up in: 1 year. You will receive a reminder letter in the mail two months in advance. If you don't receive a letter, please call our office to schedule the follow-up appointment.  

## 2012-11-27 NOTE — Progress Notes (Signed)
OFFICE NOTE  Chief Complaint:  Routine follow-up  Primary Care Physician: Cassell Smiles., MD  HPI:  [name] [name] is a 72 year old gentleman with OSA, morbid obesity, probable right heart failure and long-standing lower extremity edema. He had a venous insufficiency study, which showed deep vein insufficiency, but no superficial reflux. We have discussed stocking use; however, he is unable to get them on. Therefore, we are managing with diuretics, currently on Lasix 80 mg twice daily. He has had some lower extremity swelling. However, overall his weight has decreased markedly, down to 282 from 301. This appears to be stable today. He recently had low back surgery and reports a marked improvement in his back pain.  During rehabilitation, however he reported an increase in right knee pain with some crepitus. He is scheduled to see Dr. Eduard Clos for possible pain management (?injections). He continues to have mostly symptoms due to right heart failure, but they are only with marked exertion.   PMHx:  Past Medical History  Diagnosis Date  . CHF (congestive heart failure)   . Obesity   . Hyperlipidemia   . PUD (peptic ulcer disease)   . GERD (gastroesophageal reflux disease)   . S/P endoscopy 2007, 2012    2007: nl, May 2012: antral erosions  . Osteoarthritis   . S/P colonoscopy 2007, Feb and March 2012    hx of adenomas, left-sided diverticula, On 07/2010 TCS, fresh blood and clot noted coming from TI.  Marland Kitchen Anxiety   . Thyroid disease     hypothyroid  . Ringing in ears   . History of kidney stones   . History of bladder infections   . Heel spur     right heel  . Collagen vascular disease     history of venous insufficency and LE cellulitis  . Thrombocytopenia 12/30/2011    Stable  . BPH (benign prostatic hyperplasia)   . Sleep apnea     uses oxygen cannot tolerate CPAP  . Hypertension     Sees Dr. Rennis Golden  . Bilateral lower leg cellulitis     Past Surgical History  Procedure  Laterality Date  . Knee arthroscopy    . Rotator cuff repair    . Cholecystectomy    . Lumbar spine surgery    . Small bowel capsule study  07/2011    ?transient focal ischemia in distal small bowel to explain GI bleeding  . Esophagogastroduodenoscopy  Aug 2013    [facility], single 5mm pedunculated polyp otherwise normal. HYPERPLASTIC, NEGATIVE h.pylori  . Cardiac catheterization      02/27/07: no sign CAD, LVH, mod pulm HTN    FAMHx:  Family History  Problem Relation Age of Onset  . Colon cancer Father 1    deceased  . Prostate cancer Father   . Pancreatic cancer Mother 25    deceased    SOCHx:   reports that he quit smoking about 39 years ago. His smoking use included Cigars. He quit smokeless tobacco use about 39 years ago. His smokeless tobacco use included Chew. He reports that he does not drink alcohol or use illicit drugs.  ALLERGIES:  Allergies  Allergen Reactions  . Aspirin Other (See Comments)    G.I. Bleed     ROS: A comprehensive review of systems was negative except for: Respiratory: positive for dyspnea on exertion Musculoskeletal: positive for back pain  HOME MEDS: Current Outpatient Prescriptions  Medication Sig Dispense Refill  . acetaminophen (TYLENOL) 325 MG tablet Take 650 mg by mouth every  6 (six) hours as needed for pain.      Marland Kitchen atorvastatin (LIPITOR) 80 MG tablet Take 80 mg by mouth at bedtime.      . cetirizine (ZYRTEC) 10 MG tablet Take 10 mg by mouth daily.      . cyclobenzaprine (FLEXERIL) 10 MG tablet Take 1 tablet (10 mg total) by mouth 3 (three) times daily as needed for muscle spasms.  45 tablet  0  . docusate sodium (COLACE) 100 MG capsule Take 100 mg by mouth at bedtime.      . Dorzolamide HCl-Timolol Mal PF 22.3-6.8 MG/ML SOLN Apply 1 drop to eye 2 (two) times daily. Dorzolamide HCI-Timolol Maleate Ophthalmic Solution 2.23%/0.68%      . furosemide (LASIX) 80 MG tablet Take 80 mg by mouth 2 (two) times daily.       Marland Kitchen latanoprost (XALATAN)  0.005 % ophthalmic solution Place 1 drop into both eyes at bedtime.      . metoprolol succinate (TOPROL-XL) 25 MG 24 hr tablet Take 12.5 mg by mouth every morning.       . ondansetron (ZOFRAN) 4 MG tablet Take 1 tablet (4 mg total) by mouth every 8 (eight) hours as needed. For nausea  30 tablet  3  . OVER THE COUNTER MEDICATION Apply 1 application topically daily after breakfast. "OTC cream" for pain      . oxyCODONE-acetaminophen (PERCOCET) 10-325 MG per tablet Take 1 tablet by mouth every 6 (six) hours as needed for pain.  70 tablet  0  . pantoprazole (PROTONIX) 40 MG tablet Take 40 mg by mouth 2 (two) times daily.      [name] (LUBRICANT EYE DROPS) 0.4-0.3 % SOLN Apply 1 drop to eye daily as needed (for lubricant eye drops).      . potassium chloride SA (K-DUR,KLOR-CON) 20 MEQ tablet Take 1 tablet (20 mEq total) by mouth 2 (two) times daily.  60 tablet  0  . Tamsulosin HCl (FLOMAX) 0.4 MG CAPS Take 0.4 mg by mouth daily after supper.        No current facility-administered medications for this visit.    LABS/IMAGING: No results found for this or any previous visit (from the past 48 hour(s)). No results found.  VITALS: BP 120/70  Pulse 80  Ht 5\' 9"  (1.753 m)  Wt 282 lb 3.2 oz (128.005 kg)  BMI 41.65 kg/m2  EXAM: General appearance: alert, no distress and morbidly obese Neck: no adenopathy, no carotid bruit, no JVD, supple, symmetrical, trachea midline and thyroid not enlarged, symmetric, no tenderness/mass/nodules Lungs: clear to auscultation bilaterally Heart: regular rate and rhythm, S1, S2 normal, no murmur, click, rub or gallop Abdomen: morbidly obese, distended Extremities: edema 2 Pulses: 2+ and symmetric Skin: Skin color, texture, turgor normal. No rashes or lesions Neurologic: Grossly normal  EKG: Sinus rhythm with first degree AV block at 77  ASSESSMENT: 1. Morbid obesity 2. Obstructive sleep apnea intolerant to CPAP, on home oxygen at  night 3. Symptoms of right heart failure 4. Low back pain status post recent back surgery  PLAN: 1.   [name] seems to be stable on his current regimen of diuretics.  Weight is unchanged. I've encouraged him to continue to work on weight loss, especially now that he has had back surgery. We will plan to continue his current medications. Have also encouraged lower sternum the stocking wear as much is possible for his swelling. Plan to see him back in one year or sooner as necessary.  Chrystie Nose, MD, [facility] Attending Cardiologist [facility]  [name] [name] C 11/27/2012, 2:01 PM

## 2012-11-29 DIAGNOSIS — N400 Enlarged prostate without lower urinary tract symptoms: Secondary | ICD-10-CM | POA: Diagnosis not present

## 2012-11-30 DIAGNOSIS — I5032 Chronic diastolic (congestive) heart failure: Secondary | ICD-10-CM | POA: Diagnosis not present

## 2012-11-30 DIAGNOSIS — I509 Heart failure, unspecified: Secondary | ICD-10-CM | POA: Diagnosis not present

## 2012-11-30 DIAGNOSIS — I10 Essential (primary) hypertension: Secondary | ICD-10-CM | POA: Diagnosis not present

## 2012-11-30 DIAGNOSIS — M159 Polyosteoarthritis, unspecified: Secondary | ICD-10-CM | POA: Diagnosis not present

## 2012-11-30 DIAGNOSIS — K219 Gastro-esophageal reflux disease without esophagitis: Secondary | ICD-10-CM | POA: Diagnosis not present

## 2012-11-30 DIAGNOSIS — Z4789 Encounter for other orthopedic aftercare: Secondary | ICD-10-CM | POA: Diagnosis not present

## 2012-12-04 DIAGNOSIS — M25569 Pain in unspecified knee: Secondary | ICD-10-CM | POA: Diagnosis not present

## 2012-12-04 DIAGNOSIS — G894 Chronic pain syndrome: Secondary | ICD-10-CM | POA: Diagnosis not present

## 2012-12-04 DIAGNOSIS — M961 Postlaminectomy syndrome, not elsewhere classified: Secondary | ICD-10-CM | POA: Diagnosis not present

## 2012-12-04 DIAGNOSIS — IMO0002 Reserved for concepts with insufficient information to code with codable children: Secondary | ICD-10-CM | POA: Diagnosis not present

## 2012-12-04 DIAGNOSIS — M47817 Spondylosis without myelopathy or radiculopathy, lumbosacral region: Secondary | ICD-10-CM | POA: Diagnosis not present

## 2012-12-04 DIAGNOSIS — M545 Low back pain, unspecified: Secondary | ICD-10-CM | POA: Diagnosis not present

## 2012-12-04 DIAGNOSIS — M1710 Unilateral primary osteoarthritis, unspecified knee: Secondary | ICD-10-CM | POA: Diagnosis not present

## 2012-12-04 DIAGNOSIS — M79609 Pain in unspecified limb: Secondary | ICD-10-CM | POA: Diagnosis not present

## 2012-12-05 DIAGNOSIS — M159 Polyosteoarthritis, unspecified: Secondary | ICD-10-CM | POA: Diagnosis not present

## 2012-12-05 DIAGNOSIS — I5032 Chronic diastolic (congestive) heart failure: Secondary | ICD-10-CM | POA: Diagnosis not present

## 2012-12-05 DIAGNOSIS — Z4789 Encounter for other orthopedic aftercare: Secondary | ICD-10-CM | POA: Diagnosis not present

## 2012-12-05 DIAGNOSIS — I509 Heart failure, unspecified: Secondary | ICD-10-CM | POA: Diagnosis not present

## 2012-12-11 DIAGNOSIS — G894 Chronic pain syndrome: Secondary | ICD-10-CM | POA: Diagnosis not present

## 2012-12-11 DIAGNOSIS — M545 Low back pain, unspecified: Secondary | ICD-10-CM | POA: Diagnosis not present

## 2012-12-11 DIAGNOSIS — M47817 Spondylosis without myelopathy or radiculopathy, lumbosacral region: Secondary | ICD-10-CM | POA: Diagnosis not present

## 2012-12-11 DIAGNOSIS — M79609 Pain in unspecified limb: Secondary | ICD-10-CM | POA: Diagnosis not present

## 2012-12-24 DIAGNOSIS — M47817 Spondylosis without myelopathy or radiculopathy, lumbosacral region: Secondary | ICD-10-CM | POA: Diagnosis not present

## 2012-12-25 DIAGNOSIS — M25569 Pain in unspecified knee: Secondary | ICD-10-CM | POA: Diagnosis not present

## 2012-12-25 DIAGNOSIS — IMO0002 Reserved for concepts with insufficient information to code with codable children: Secondary | ICD-10-CM | POA: Diagnosis not present

## 2012-12-25 DIAGNOSIS — M47817 Spondylosis without myelopathy or radiculopathy, lumbosacral region: Secondary | ICD-10-CM | POA: Diagnosis not present

## 2012-12-25 DIAGNOSIS — M1710 Unilateral primary osteoarthritis, unspecified knee: Secondary | ICD-10-CM | POA: Diagnosis not present

## 2012-12-25 DIAGNOSIS — M79609 Pain in unspecified limb: Secondary | ICD-10-CM | POA: Diagnosis not present

## 2012-12-25 DIAGNOSIS — G894 Chronic pain syndrome: Secondary | ICD-10-CM | POA: Diagnosis not present

## 2012-12-28 ENCOUNTER — Encounter (HOSPITAL_COMMUNITY): Payer: Medicare Other | Attending: Internal Medicine

## 2012-12-28 DIAGNOSIS — D696 Thrombocytopenia, unspecified: Secondary | ICD-10-CM | POA: Insufficient documentation

## 2012-12-28 LAB — CBC WITH DIFFERENTIAL/PLATELET
Eosinophils Relative: 1 % (ref 0–5)
HCT: 41.5 % (ref 39.0–52.0)
Lymphocytes Relative: 13 % (ref 12–46)
Lymphs Abs: 1.2 10*3/uL (ref 0.7–4.0)
MCV: 79.3 fL (ref 78.0–100.0)
Platelets: 116 10*3/uL — ABNORMAL LOW (ref 150–400)
RBC: 5.23 MIL/uL (ref 4.22–5.81)
WBC: 9.6 10*3/uL (ref 4.0–10.5)

## 2012-12-28 NOTE — Progress Notes (Signed)
Labs drawn today for cbc/diff 

## 2013-01-01 ENCOUNTER — Ambulatory Visit (HOSPITAL_COMMUNITY): Payer: Medicare Other

## 2013-01-01 DIAGNOSIS — G894 Chronic pain syndrome: Secondary | ICD-10-CM | POA: Diagnosis not present

## 2013-01-01 DIAGNOSIS — M545 Low back pain, unspecified: Secondary | ICD-10-CM | POA: Diagnosis not present

## 2013-01-01 DIAGNOSIS — M25569 Pain in unspecified knee: Secondary | ICD-10-CM | POA: Diagnosis not present

## 2013-01-01 DIAGNOSIS — M1710 Unilateral primary osteoarthritis, unspecified knee: Secondary | ICD-10-CM | POA: Diagnosis not present

## 2013-01-01 DIAGNOSIS — M47817 Spondylosis without myelopathy or radiculopathy, lumbosacral region: Secondary | ICD-10-CM | POA: Diagnosis not present

## 2013-01-01 DIAGNOSIS — IMO0002 Reserved for concepts with insufficient information to code with codable children: Secondary | ICD-10-CM | POA: Diagnosis not present

## 2013-01-02 ENCOUNTER — Encounter (HOSPITAL_BASED_OUTPATIENT_CLINIC_OR_DEPARTMENT_OTHER): Payer: Medicare Other

## 2013-01-02 ENCOUNTER — Encounter (HOSPITAL_COMMUNITY): Payer: Self-pay

## 2013-01-02 VITALS — BP 138/87 | HR 77 | Temp 97.7°F | Resp 16 | Wt 279.9 lb

## 2013-01-02 DIAGNOSIS — D696 Thrombocytopenia, unspecified: Secondary | ICD-10-CM | POA: Diagnosis not present

## 2013-01-02 NOTE — Patient Instructions (Addendum)
[facility] Discharge Instructions  RECOMMENDATIONS MADE BY THE CONSULTANT AND ANY TEST RESULTS WILL BE SENT TO YOUR REFERRING PHYSICIAN.  EXAM FINDINGS BY THE PHYSICIAN TODAY AND SIGNS OR SYMPTOMS TO REPORT TO CLINIC OR PRIMARY PHYSICIAN: Exam and discussion by Dr. Neale Burly  MEDICATIONS PRESCRIBED:  none  INSTRUCTIONS GIVEN AND DISCUSSED: Report unusual bruising or bleeding.  SPECIAL INSTRUCTIONS/FOLLOW-UP: Blood work today, in 2 months, in 6 months and follow-up in 6 months  Thank you for choosing [facility] to provide your oncology and hematology care.  To afford each patient quality time with our providers, please arrive at least 15 minutes before your scheduled appointment time.  With your help, our goal is to use those 15 minutes to complete the necessary work-up to ensure our physicians have the information they need to help with your evaluation and healthcare recommendations.    Effective January 1st, 2014, we ask that you re-schedule your appointment with our physicians should you arrive 10 or more minutes late for your appointment.  We strive to give you quality time with our providers, and arriving late affects you and other patients whose appointments are after yours.    Again, thank you for choosing [facility].  Our hope is that these requests will decrease the amount of time that you wait before being seen by our physicians.       _____________________________________________________________  Should you have questions after your visit to [facility], please contact our office at [phone] between the hours of 8:30 a.m. and 5:00 p.m.  Voicemails left after 4:30 p.m. will not be returned until the following business day.  For prescription refill requests, have your pharmacy contact our office with your prescription refill request.

## 2013-01-03 LAB — ANTI-NUCLEAR AB-TITER (ANA TITER): ANA Titer 1: 1:160 {titer} — ABNORMAL HIGH

## 2013-01-03 LAB — HEPATITIS C VRS RNA DETECT BY PCR-QUAL: Hepatitis C Vrs RNA by PCR-Qual: NEGATIVE

## 2013-01-03 NOTE — Progress Notes (Signed)
[facility] OFFICE PROGRESS NOTE  PCP: Cassell Smiles., MD [address] [place] [address]  DIAGNOSIS: Thrombocytopenia - Plan: Hepatitis c vrs RNA detect by PCR-qual, ANA, CBC, CBC, Comprehensive metabolic panel, Hepatitis c vrs RNA detect by PCR-qual, ANA platelets of wax and wane over time also waxing and waning currently borderline high normal neutrophils  CURRENT THERAPY:none  INTERVAL HISTORY: [name] [name] 72 y.o. male returns for routine followup. Last seen in the clinic at 07/03/12. He continues other providers for multiple medical problems. He denies any acute issues. He has had problems with fluid retention and congestive heart failure in the past but currently he is not short of breath and has not had any new edema. Currently not short of breath. He has occasional paresthesias in the fingers on one hand wax and wane not progressive and he has currently mild back pain which is also stable. No bruising or bleeding or epistaxis  MEDICAL HISTORY: Past Medical History  Diagnosis Date  . CHF (congestive heart failure)   . Obesity   . Hyperlipidemia   . PUD (peptic ulcer disease)   . GERD (gastroesophageal reflux disease)   . S/P endoscopy 2007, 2012    2007: nl, May 2012: antral erosions  . Osteoarthritis   . S/P colonoscopy 2007, Feb and March 2012    hx of adenomas, left-sided diverticula, On 07/2010 TCS, fresh blood and clot noted coming from TI.  Marland Kitchen Anxiety   . Thyroid disease     hypothyroid  . Ringing in ears   . History of kidney stones   . History of bladder infections   . Heel spur     right heel  . Collagen vascular disease     history of venous insufficency and LE cellulitis  . Thrombocytopenia 12/30/2011    Stable  . BPH (benign prostatic hyperplasia)   . Sleep apnea     uses oxygen cannot tolerate CPAP  . Hypertension     Sees Dr. Rennis Golden  . Bilateral lower leg cellulitis     SURGICAL HISTORY:  Past Surgical History   Procedure Laterality Date  . Knee arthroscopy    . Rotator cuff repair    . Cholecystectomy    . Lumbar spine surgery    . Small bowel capsule study  07/2011    ?transient focal ischemia in distal small bowel to explain GI bleeding  . Esophagogastroduodenoscopy  Aug 2013    [facility], single 5mm pedunculated polyp otherwise normal. HYPERPLASTIC, NEGATIVE h.pylori  . Cardiac catheterization      02/27/07: no sign CAD, LVH, mod pulm HTN      PROBLEM LIST : has HYPERTENSION; GASTROESOPHAGEAL REFLUX DISEASE, CHRONIC; FATTY LIVER DISEASE; CHOLELITHIASIS, SYMPTOMATIC; RENAL INSUFFICIENCY; ABDOMINAL BLOATING; COLONIC POLYPS, ADENOMATOUS, HX OF; NAUSEA; DIARRHEA; Rectal bleeding; Constipation; Hypothyroidism; Upper abdominal pain; Anorexia; Bowel habit changes; Thrombocytopenia; SIRS (systemic inflammatory response syndrome); UTI (lower urinary tract infection); BPH (benign prostatic hyperplasia); Obesity; Hyperlipidemia; Constipation; Edema; Cellulitis; Bright red blood per rectum; Other spondylosis with radiculopathy, lumbar region; Right heart failure; and OSA (obstructive sleep apnea) on his problem list.    ALLERGIES:  is allergic to aspirin.  MEDICATIONS: Current outpatient prescriptions:acetaminophen (TYLENOL) 325 MG tablet, Take 650 mg by mouth every 6 (six) hours as needed for pain., Disp: , Rfl: ;  atorvastatin (LIPITOR) 80 MG tablet, Take 80 mg by mouth at bedtime., Disp: , Rfl: ;  cetirizine (ZYRTEC) 10 MG tablet, Take 10 mg by mouth daily., Disp: ,  Rfl:  cyclobenzaprine (FLEXERIL) 10 MG tablet, Take 1 tablet (10 mg total) by mouth 3 (three) times daily as needed for muscle spasms., Disp: 45 tablet, Rfl: 0;  docusate sodium (COLACE) 100 MG capsule, Take 100 mg by mouth at bedtime., Disp: , Rfl: ;  Dorzolamide HCl-Timolol Mal PF 22.3-6.8 MG/ML SOLN, Apply 1 drop to eye 2 (two) times daily. Dorzolamide HCI-Timolol Maleate Ophthalmic Solution 2.23%/0.68%, Disp: , Rfl:  furosemide (LASIX) 80 MG  tablet, Take 80 mg by mouth 2 (two) times daily. , Disp: , Rfl: ;  latanoprost (XALATAN) 0.005 % ophthalmic solution, Place 1 drop into both eyes at bedtime., Disp: , Rfl: ;  metoprolol succinate (TOPROL-XL) 25 MG 24 hr tablet, Take 12.5 mg by mouth every morning. , Disp: , Rfl: ;  ondansetron (ZOFRAN) 4 MG tablet, Take 1 tablet (4 mg total) by mouth every 8 (eight) hours as needed. For nausea, Disp: 30 tablet, Rfl: 3 OVER THE COUNTER MEDICATION, Apply 1 application topically daily after breakfast. "OTC cream" for pain, Disp: , Rfl: ;  oxyCODONE-acetaminophen (PERCOCET) 10-325 MG per tablet, Take 1 tablet by mouth every 6 (six) hours as needed for pain., Disp: 70 tablet, Rfl: 0;  pantoprazole (PROTONIX) 40 MG tablet, Take 40 mg by mouth 2 (two) times daily., Disp: , Rfl:  Polyethyl Glycol-Propyl Glycol (LUBRICANT EYE DROPS) 0.4-0.3 % SOLN, Apply 1 drop to eye daily as needed (for lubricant eye drops)., Disp: , Rfl: ;  potassium chloride SA (K-DUR,KLOR-CON) 20 MEQ tablet, Take 1 tablet (20 mEq total) by mouth 2 (two) times daily., Disp: 60 tablet, Rfl: 0;  Tamsulosin HCl (FLOMAX) 0.4 MG CAPS, Take 0.4 mg by mouth daily after supper. , Disp: , Rfl:     REVIEW OF SYSTEMS:  He, denies any headache dizziness no palpitations or retrosternal chest pain currently not short of breath no, pain   PHYSICAL EXAMINATION: ECOG PERFORMANCE STATUS: 1 - Symptomatic but completely ambulatory  Filed Vitals:   01/02/13 0952  BP: 138/87  Pulse: 77  Temp: 97.7 F (36.5 C)  Resp: 16    GENERAL: No distress, well nourished.  SKIN:  No rashes or bruising  HEAD: Normocephalic, No trauma EYES: Sclera and conjuntiva clear  ENT: No thrush LYMPH: No palpable lymphadenopathy neck, supraclavicular submandibular axilla BREAST: Not examined  LUNGS: Clear to auscultation, no crackles or wheezes or rhonchi HEART: Regular rate & rhythm,   ABDOMEN: Abdomen soft, non-tender, , no masses or organomegaly , obese  EXTREMITIES:  Some mild pitting lower extremity edema as per patient and compared to previous notations this to be less than in the past NEURO: Alert & oriented, no gross focal weakness. PSYCH  Cooperative, mood/affect normal   LABORATORY DATA: See results, 12/28/12 hemoglobin 13.4 platelets 116 neutrophil 7.7   RADIOGRAPHIC STUDIES: No results found.   ASSESSMENT: Chronic thrombocytopenia, his wax and wane, but in the normal range number of occasions including back in February. Currently lower than the last determination at 116 but not lower than a number of prior results the courses not determined, could be medication affect, possibly low-grade ITP, possibly early myelodysplasia, possibly chronic passive congestion of the spleen from congestion of the liver. Patient did not have splenomegaly on a CT scan from last year. He had a negative HIV back in 2012. I think is reasonable to document HEP C AND ANA . Re: waxing and waning leukocytosis, today, neutrophil count of 7.7 which is upper limit of normal. With a possible reactive leukocytosis and with that  no progression over time I think you should be followed if he had progressively high white count then he should have a BCR i documented in the future   PLAN:  Follow up serial CBC. Particular attention to platelets and white count. If white count to be progressively up and would want to document a BCR to rule out CML. Go ahead and document hepatitis C and ANA serologies.   All questions were answered. The patient knows to call the clinic with any problems, questions or concerns. We can certainly see the patient much sooner if necessary.     Marin Roberts, MD 01/03/2013 8:27 AM

## 2013-01-07 DIAGNOSIS — I10 Essential (primary) hypertension: Secondary | ICD-10-CM | POA: Diagnosis not present

## 2013-01-07 DIAGNOSIS — Z6841 Body Mass Index (BMI) 40.0 and over, adult: Secondary | ICD-10-CM | POA: Diagnosis not present

## 2013-01-07 DIAGNOSIS — H9319 Tinnitus, unspecified ear: Secondary | ICD-10-CM | POA: Diagnosis not present

## 2013-01-07 DIAGNOSIS — E039 Hypothyroidism, unspecified: Secondary | ICD-10-CM | POA: Diagnosis not present

## 2013-02-04 ENCOUNTER — Other Ambulatory Visit: Payer: Self-pay | Admitting: Internal Medicine

## 2013-02-05 NOTE — Telephone Encounter (Signed)
Rx was sent to pharmacy electronically. 

## 2013-02-12 ENCOUNTER — Other Ambulatory Visit: Payer: Self-pay | Admitting: *Deleted

## 2013-02-12 MED ORDER — METOPROLOL SUCCINATE ER 25 MG PO TB24: 12.5000 mg | ORAL_TABLET | Freq: Every morning | ORAL | Status: DC

## 2013-03-04 ENCOUNTER — Encounter (HOSPITAL_COMMUNITY): Payer: Medicare Other | Attending: Internal Medicine

## 2013-03-04 DIAGNOSIS — D696 Thrombocytopenia, unspecified: Secondary | ICD-10-CM | POA: Diagnosis not present

## 2013-03-04 LAB — CBC
HCT: 43.8 % (ref 39.0–52.0)
Platelets: 122 10*3/uL — ABNORMAL LOW (ref 150–400)
RDW: 17 % — ABNORMAL HIGH (ref 11.5–15.5)
WBC: 10.8 10*3/uL — ABNORMAL HIGH (ref 4.0–10.5)

## 2013-03-04 NOTE — Progress Notes (Signed)
Labs drawn today for cbc 

## 2013-04-23 DIAGNOSIS — M545 Low back pain, unspecified: Secondary | ICD-10-CM | POA: Diagnosis not present

## 2013-04-23 DIAGNOSIS — M47817 Spondylosis without myelopathy or radiculopathy, lumbosacral region: Secondary | ICD-10-CM | POA: Diagnosis not present

## 2013-04-30 DIAGNOSIS — Z6841 Body Mass Index (BMI) 40.0 and over, adult: Secondary | ICD-10-CM | POA: Diagnosis not present

## 2013-04-30 DIAGNOSIS — I10 Essential (primary) hypertension: Secondary | ICD-10-CM | POA: Diagnosis not present

## 2013-04-30 DIAGNOSIS — M159 Polyosteoarthritis, unspecified: Secondary | ICD-10-CM | POA: Diagnosis not present

## 2013-04-30 DIAGNOSIS — R509 Fever, unspecified: Secondary | ICD-10-CM | POA: Diagnosis not present

## 2013-04-30 DIAGNOSIS — Z23 Encounter for immunization: Secondary | ICD-10-CM | POA: Diagnosis not present

## 2013-04-30 DIAGNOSIS — J0100 Acute maxillary sinusitis, unspecified: Secondary | ICD-10-CM | POA: Diagnosis not present

## 2013-05-01 ENCOUNTER — Other Ambulatory Visit (HOSPITAL_COMMUNITY): Payer: Self-pay | Admitting: Internal Medicine

## 2013-05-01 ENCOUNTER — Other Ambulatory Visit: Payer: Self-pay | Admitting: Gastroenterology

## 2013-05-01 DIAGNOSIS — N390 Urinary tract infection, site not specified: Secondary | ICD-10-CM | POA: Diagnosis not present

## 2013-05-01 DIAGNOSIS — R519 Headache, unspecified: Secondary | ICD-10-CM

## 2013-05-01 DIAGNOSIS — J0100 Acute maxillary sinusitis, unspecified: Secondary | ICD-10-CM

## 2013-05-01 DIAGNOSIS — H539 Unspecified visual disturbance: Secondary | ICD-10-CM

## 2013-05-03 ENCOUNTER — Other Ambulatory Visit: Payer: Self-pay

## 2013-05-03 ENCOUNTER — Ambulatory Visit (HOSPITAL_COMMUNITY)
Admission: RE | Admit: 2013-05-03 | Discharge: 2013-05-03 | Disposition: A | Discharge status: Home / Self Care | Payer: Medicare Other | Source: Ambulatory Visit | Attending: Internal Medicine | Admitting: Internal Medicine

## 2013-05-03 ENCOUNTER — Inpatient Hospital Stay (HOSPITAL_COMMUNITY)
Admission: EM | Admit: 2013-05-03 | Discharge: 2013-05-05 | DRG: 069 | Disposition: A | Discharge status: Home / Self Care | Payer: Medicare Other | Attending: Internal Medicine | Admitting: Internal Medicine

## 2013-05-03 ENCOUNTER — Encounter (HOSPITAL_COMMUNITY): Payer: Self-pay | Admitting: Emergency Medicine

## 2013-05-03 DIAGNOSIS — R519 Headache, unspecified: Secondary | ICD-10-CM

## 2013-05-03 DIAGNOSIS — G8190 Hemiplegia, unspecified affecting unspecified side: Secondary | ICD-10-CM | POA: Diagnosis present

## 2013-05-03 DIAGNOSIS — Z886 Allergy status to analgesic agent status: Secondary | ICD-10-CM | POA: Diagnosis not present

## 2013-05-03 DIAGNOSIS — Z9089 Acquired absence of other organs: Secondary | ICD-10-CM | POA: Diagnosis not present

## 2013-05-03 DIAGNOSIS — I5032 Chronic diastolic (congestive) heart failure: Secondary | ICD-10-CM | POA: Diagnosis present

## 2013-05-03 DIAGNOSIS — Z87440 Personal history of urinary (tract) infections: Secondary | ICD-10-CM | POA: Diagnosis not present

## 2013-05-03 DIAGNOSIS — G458 Other transient cerebral ischemic attacks and related syndromes: Principal | ICD-10-CM | POA: Diagnosis present

## 2013-05-03 DIAGNOSIS — G4730 Sleep apnea, unspecified: Secondary | ICD-10-CM | POA: Diagnosis present

## 2013-05-03 DIAGNOSIS — G43109 Migraine with aura, not intractable, without status migrainosus: Secondary | ICD-10-CM | POA: Diagnosis present

## 2013-05-03 DIAGNOSIS — Z8042 Family history of malignant neoplasm of prostate: Secondary | ICD-10-CM | POA: Diagnosis not present

## 2013-05-03 DIAGNOSIS — E785 Hyperlipidemia, unspecified: Secondary | ICD-10-CM | POA: Diagnosis present

## 2013-05-03 DIAGNOSIS — E669 Obesity, unspecified: Secondary | ICD-10-CM | POA: Diagnosis not present

## 2013-05-03 DIAGNOSIS — K219 Gastro-esophageal reflux disease without esophagitis: Secondary | ICD-10-CM | POA: Diagnosis present

## 2013-05-03 DIAGNOSIS — I509 Heart failure, unspecified: Secondary | ICD-10-CM | POA: Diagnosis present

## 2013-05-03 DIAGNOSIS — G43909 Migraine, unspecified, not intractable, without status migrainosus: Secondary | ICD-10-CM

## 2013-05-03 DIAGNOSIS — I639 Cerebral infarction, unspecified: Secondary | ICD-10-CM

## 2013-05-03 DIAGNOSIS — Z8711 Personal history of peptic ulcer disease: Secondary | ICD-10-CM | POA: Diagnosis not present

## 2013-05-03 DIAGNOSIS — Z87891 Personal history of nicotine dependence: Secondary | ICD-10-CM

## 2013-05-03 DIAGNOSIS — G936 Cerebral edema: Secondary | ICD-10-CM | POA: Diagnosis not present

## 2013-05-03 DIAGNOSIS — H539 Unspecified visual disturbance: Secondary | ICD-10-CM

## 2013-05-03 DIAGNOSIS — I369 Nonrheumatic tricuspid valve disorder, unspecified: Secondary | ICD-10-CM | POA: Diagnosis not present

## 2013-05-03 DIAGNOSIS — F411 Generalized anxiety disorder: Secondary | ICD-10-CM | POA: Diagnosis present

## 2013-05-03 DIAGNOSIS — R29898 Other symptoms and signs involving the musculoskeletal system: Secondary | ICD-10-CM | POA: Diagnosis not present

## 2013-05-03 DIAGNOSIS — E039 Hypothyroidism, unspecified: Secondary | ICD-10-CM

## 2013-05-03 DIAGNOSIS — Z7902 Long term (current) use of antithrombotics/antiplatelets: Secondary | ICD-10-CM | POA: Diagnosis not present

## 2013-05-03 DIAGNOSIS — Z87442 Personal history of urinary calculi: Secondary | ICD-10-CM

## 2013-05-03 DIAGNOSIS — I6350 Cerebral infarction due to unspecified occlusion or stenosis of unspecified cerebral artery: Secondary | ICD-10-CM | POA: Diagnosis not present

## 2013-05-03 DIAGNOSIS — Z860100 Personal history of colon polyps, unspecified: Secondary | ICD-10-CM

## 2013-05-03 DIAGNOSIS — Z800 Family history of malignant neoplasm of digestive organs: Secondary | ICD-10-CM

## 2013-05-03 DIAGNOSIS — I10 Essential (primary) hypertension: Secondary | ICD-10-CM | POA: Diagnosis present

## 2013-05-03 DIAGNOSIS — R51 Headache: Secondary | ICD-10-CM | POA: Diagnosis not present

## 2013-05-03 DIAGNOSIS — K5900 Constipation, unspecified: Secondary | ICD-10-CM

## 2013-05-03 DIAGNOSIS — N400 Enlarged prostate without lower urinary tract symptoms: Secondary | ICD-10-CM | POA: Diagnosis present

## 2013-05-03 DIAGNOSIS — R209 Unspecified disturbances of skin sensation: Secondary | ICD-10-CM | POA: Diagnosis not present

## 2013-05-03 DIAGNOSIS — J0100 Acute maxillary sinusitis, unspecified: Secondary | ICD-10-CM

## 2013-05-03 DIAGNOSIS — Z79899 Other long term (current) drug therapy: Secondary | ICD-10-CM

## 2013-05-03 DIAGNOSIS — R194 Change in bowel habit: Secondary | ICD-10-CM

## 2013-05-03 LAB — DIFFERENTIAL
Basophils Absolute: 0.1 10*3/uL (ref 0.0–0.1)
Basophils Relative: 0 % (ref 0–1)
Lymphocytes Relative: 11 % — ABNORMAL LOW (ref 12–46)
Lymphs Abs: 1.7 10*3/uL (ref 0.7–4.0)
Monocytes Absolute: 0.9 10*3/uL (ref 0.1–1.0)
Neutro Abs: 12.1 10*3/uL — ABNORMAL HIGH (ref 1.7–7.7)
Neutrophils Relative %: 82 % — ABNORMAL HIGH (ref 43–77)

## 2013-05-03 LAB — GLUCOSE, CAPILLARY: Glucose-Capillary: 124 mg/dL — ABNORMAL HIGH (ref 70–99)

## 2013-05-03 LAB — CBC
HCT: 44.5 % (ref 39.0–52.0)
Hemoglobin: 14.7 g/dL (ref 13.0–17.0)
MCV: 79.6 fL (ref 78.0–100.0)
RBC: 5.59 MIL/uL (ref 4.22–5.81)
RDW: 17.7 % — ABNORMAL HIGH (ref 11.5–15.5)
WBC: 14.8 10*3/uL — ABNORMAL HIGH (ref 4.0–10.5)

## 2013-05-03 LAB — COMPREHENSIVE METABOLIC PANEL
ALT: 13 U/L (ref 0–53)
AST: 17 U/L (ref 0–37)
Albumin: 3.3 g/dL — ABNORMAL LOW (ref 3.5–5.2)
Alkaline Phosphatase: 89 U/L (ref 39–117)
CO2: 31 mEq/L (ref 19–32)
Chloride: 100 mEq/L (ref 96–112)
Creatinine, Ser: 0.93 mg/dL (ref 0.50–1.35)
GFR calc non Af Amer: 81 mL/min — ABNORMAL LOW (ref >90)
Potassium: 4 mEq/L (ref 3.5–5.1)
Total Bilirubin: 0.7 mg/dL (ref 0.3–1.2)

## 2013-05-03 LAB — PROTIME-INR: INR: 0.97 (ref 0.00–1.49)

## 2013-05-03 LAB — APTT: aPTT: 36 seconds (ref 24–37)

## 2013-05-03 LAB — TROPONIN I: Troponin I: <0.3 ng/mL (ref <0.30)

## 2013-05-03 MED ORDER — CLOPIDOGREL BISULFATE 300 MG PO TABS
300.0000 mg | ORAL_TABLET | Freq: Once | ORAL | Status: AC
  Administered 2013-05-03: 300 mg via ORAL
  Filled 2013-05-03: qty 1

## 2013-05-03 MED ORDER — GABAPENTIN 600 MG PO TABS
300.0000 mg | ORAL_TABLET | Freq: Three times a day (TID) | ORAL | Status: DC
  Filled 2013-05-03: qty 0.5

## 2013-05-03 MED ORDER — CLOPIDOGREL BISULFATE 75 MG PO TABS
75.0000 mg | ORAL_TABLET | Freq: Every day | ORAL | Status: DC
  Administered 2013-05-04 – 2013-05-05 (×2): 75 mg via ORAL
  Filled 2013-05-03 (×3): qty 1

## 2013-05-03 MED ORDER — GABAPENTIN 300 MG PO CAPS
300.0000 mg | ORAL_CAPSULE | Freq: Three times a day (TID) | ORAL | Status: DC
  Administered 2013-05-03 – 2013-05-05 (×6): 300 mg via ORAL
  Filled 2013-05-03 (×7): qty 1

## 2013-05-03 NOTE — ED Notes (Signed)
Pt given [place] sandwich- okay by Dr. Amada Jupiter.

## 2013-05-03 NOTE — ED Notes (Signed)
MD at bedside. 

## 2013-05-03 NOTE — Consult Note (Signed)
Neurology Consultation Reason for Consult: Stroke Referring Physician: Sarina Ser.  CC: Stroke  History is obtained from: Patient  HPI: [name] [name] is a 72 y.o. male with a history of CHF, hyperlipidemia, peptic ulcer disease who presents with right-sided numbness following pain to his right side of his neck in the posterior aspect this started about a week ago. He states that it symptoms came on suddenly at approximately the same time, went to his primary care physician today who performed a CT which shows some hypodensity in the right cerebellum concerning for stroke.   LKW: One week ago tpa given?: no, outside of window    ROS: A 14 point ROS was performed and is negative except as noted in the HPI.  Past Medical History  Diagnosis Date  . CHF (congestive heart failure)   . Obesity   . Hyperlipidemia   . PUD (peptic ulcer disease)   . GERD (gastroesophageal reflux disease)   . S/P endoscopy 2007, 2012    2007: nl, May 2012: antral erosions  . Osteoarthritis   . S/P colonoscopy 2007, Feb and March 2012    hx of adenomas, left-sided diverticula, On 07/2010 TCS, fresh blood and clot noted coming from TI.  Marland Kitchen Anxiety   . Thyroid disease     hypothyroid  . Ringing in ears   . History of kidney stones   . History of bladder infections   . Heel spur     right heel  . Collagen vascular disease     history of venous insufficency and LE cellulitis  . Thrombocytopenia 12/30/2011    Stable  . BPH (benign prostatic hyperplasia)   . Sleep apnea     uses oxygen cannot tolerate CPAP  . Hypertension     Sees Dr. Rennis Golden  . Bilateral lower leg cellulitis     Family History: Father-colon cancer  Social History: Tob: Quit 1975  Exam: Current vital signs: BP 132/71  Pulse 87  Temp(Src) 97.5 F (36.4 C) (Oral)  Resp 22  Ht 5\' 9"  (1.753 m)  Wt 133.811 kg (295 lb)  BMI 43.54 kg/m2  SpO2 97% Vital signs in last 24 hours: Temp:  [97.5 F (36.4 C)-98 F (36.7 C)] 97.5 F  (36.4 C) (12/12 2237) Pulse Rate:  [85-93] 87 (12/12 2230) Resp:  [14-24] 22 (12/12 2237) BP: (109-155)/(59-78) 132/71 mmHg (12/12 2237) SpO2:  [93 %-97 %] 97 % (12/12 2237) Weight:  [133.811 kg (295 lb)] 133.811 kg (295 lb) (12/12 1813)  General: In bed, NAD CV: Regular rate and rhythm Mental Status: Patient is awake, alert, oriented to person, place, month, year, and situation. Immediate and remote memory are intact. Patient is able to give a clear and coherent history. No signs of aphasia or neglect Cranial Nerves: II: Visual Fields are full. Pupils are equal, round, and reactive to light.  Discs are difficult to visualize. III,IV, VI: EOMI without ptosis or diploplia.  V: Facial sensation is decreased on the right VII: Facial movement is symmetric.  VIII: hearing is intact to voice X: Uvula elevates symmetrically XI: Shoulder shrug is symmetric. XII: tongue is midline without atrophy or fasciculations.  Motor: Tone is normal. Bulk is normal. 5/5 strength was present in left arm, 4+/5 in right arm, 4/5 and left leg in 4 -/5 in right leg Sensory: Sensation is decreased throughout the right Deep Tendon Reflexes: 2+ and symmetric in the biceps and patellae.  Cerebellar: FNF intact on left, very mild difficulty in right arm Gait:  Not assessed due to  multiple medical monitors in ED setting.  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 With the right neck pain I am concerned that he might have a vertebral dissection, and therefore will obtain MR angiogram of the head and neck. He reports an allergy to aspirin, and therefore will start him on Plavix at this time.  Recommendations: 1. HgbA1c, fasting lipid panel 2. MRI, MRA  of the brain without contrast,  MRA of the neck with contrast 3. Frequent neuro checks 4. Echocardiogram 5. Carotid dopplers are not needed given that he will have an MR angiogram of the neck 6. Prophylactic therapy-Antiplatelet med: Plavix - load with 300 mg, then 75 mg daily 7. Risk factor modification 8. Telemetry monitoring 9. PT consult, OT consult    Ritta Slot, MD [facility] [phone]  If 7pm- 7am, please page neurology on call at [phone].

## 2013-05-03 NOTE — ED Notes (Signed)
Pt sent from PCP with positive CT for stroke; pt with right sided HA and right arm numbness x 1 week

## 2013-05-03 NOTE — ED Provider Notes (Signed)
CSN: [id number]     Arrival date & time 05/03/13  1805 History   First MD Initiated Contact with Patient 05/03/13 1955     Chief Complaint  Patient presents with  . Cerebrovascular Accident   (Consider location/radiation/quality/duration/timing/severity/associated sxs/prior Treatment) HPI Comments: 72 yo male who experienced a headache one week ago along with a sensation of "lightning" in his right eye.  He subsequently had several days of right arm weakness and numbness.  His PCP obtained an outpt head CT today which demonstrated an acute infarct of right cerebellum.  Subsequently presented to ED for further management.   Patient is a 72 y.o. male presenting with neurologic complaint.  Neurologic Problem This is a new problem. Episode onset: 1 week ago. The problem occurs constantly. The problem has not changed since onset.Associated symptoms include headaches. Pertinent negatives include no chest pain, no abdominal pain and no shortness of breath. Nothing aggravates the symptoms. Nothing relieves the symptoms. He has tried nothing for the symptoms.    Past Medical History  Diagnosis Date  . CHF (congestive heart failure)   . Obesity   . Hyperlipidemia   . PUD (peptic ulcer disease)   . GERD (gastroesophageal reflux disease)   . S/P endoscopy 2007, 2012    2007: nl, May 2012: antral erosions  . Osteoarthritis   . S/P colonoscopy 2007, Feb and March 2012    hx of adenomas, left-sided diverticula, On 07/2010 TCS, fresh blood and clot noted coming from TI.  Marland Kitchen Anxiety   . Thyroid disease     hypothyroid  . Ringing in ears   . History of kidney stones   . History of bladder infections   . Heel spur     right heel  . Collagen vascular disease     history of venous insufficency and LE cellulitis  . Thrombocytopenia 12/30/2011    Stable  . BPH (benign prostatic hyperplasia)   . Sleep apnea     uses oxygen cannot tolerate CPAP  . Hypertension     Sees Dr. Rennis Golden  . Bilateral lower  leg cellulitis    Past Surgical History  Procedure Laterality Date  . Knee arthroscopy    . Rotator cuff repair    . Cholecystectomy    . Lumbar spine surgery    . Small bowel capsule study  07/2011    ?transient focal ischemia in distal small bowel to explain GI bleeding  . Esophagogastroduodenoscopy  Aug 2013    [facility], single 5mm pedunculated polyp otherwise normal. HYPERPLASTIC, NEGATIVE h.pylori  . Cardiac catheterization      02/27/07: no sign CAD, LVH, mod pulm HTN   Family History  Problem Relation Age of Onset  . Colon cancer Father 58    deceased  . Prostate cancer Father   . Pancreatic cancer Mother 71    deceased   History  Substance Use Topics  . Smoking status: Former Smoker -- 8 years    Types: Cigars    Quit date: 08/10/1973  . Smokeless tobacco: Former Neurosurgeon    Types: Chew    Quit date: 08/10/1973     Comment: Pt reports intermittent smoking  . Alcohol Use: No    Review of Systems  Constitutional: Negative for fever.  HENT: Negative for congestion.   Respiratory: Negative for cough and shortness of breath.   Cardiovascular: Negative for chest pain.  Gastrointestinal: Negative for nausea, vomiting, abdominal pain and diarrhea.  Neurological: Positive for headaches.  All other systems reviewed  and are negative.    Allergies  Aspirin  Home Medications   Current Outpatient Rx  Name  Route  Sig  Dispense  Refill  . acetaminophen (TYLENOL) 325 MG tablet   Oral   Take 650 mg by mouth every 6 (six) hours as needed for pain.         Marland Kitchen atorvastatin (LIPITOR) 80 MG tablet      TAKE ONE TABLET BY MOUTH AT BEDTIME, DISCONTINUE ZOCOR   30 tablet   11   . cetirizine (ZYRTEC) 10 MG tablet   Oral   Take 10 mg by mouth daily.         . cyclobenzaprine (FLEXERIL) 10 MG tablet   Oral   Take 1 tablet (10 mg total) by mouth 3 (three) times daily as needed for muscle spasms.   45 tablet   0   . docusate sodium (COLACE) 100 MG capsule   Oral   Take  100 mg by mouth at bedtime.         . Dorzolamide HCl-Timolol Mal PF 22.3-6.8 MG/ML SOLN   Ophthalmic   Apply 1 drop to eye 2 (two) times daily. Dorzolamide HCI-Timolol Maleate Ophthalmic Solution 2.23%/0.68%         . furosemide (LASIX) 80 MG tablet   Oral   Take 80 mg by mouth 2 (two) times daily.          Marland Kitchen latanoprost (XALATAN) 0.005 % ophthalmic solution   Both Eyes   Place 1 drop into both eyes at bedtime.         . metoprolol succinate (TOPROL-XL) 25 MG 24 hr tablet   Oral   Take 0.5 tablets (12.5 mg total) by mouth every morning.   30 tablet   11   . ondansetron (ZOFRAN) 4 MG tablet      TAKE ONE TABLET BY MOUTH EVERY 8 HOURS AS NEEDED FOR NAUSEA   30 tablet   0   . OVER THE COUNTER MEDICATION   Apply externally   Apply 1 application topically daily after breakfast. "OTC cream" for pain         . oxyCODONE-acetaminophen (PERCOCET) 10-325 MG per tablet   Oral   Take 1 tablet by mouth every 6 (six) hours as needed for pain.   70 tablet   0   . pantoprazole (PROTONIX) 40 MG tablet   Oral   Take 40 mg by mouth 2 (two) times daily.         [name] (LUBRICANT EYE DROPS) 0.4-0.3 % SOLN   Ophthalmic   Apply 1 drop to eye daily as needed (for lubricant eye drops).         . potassium chloride SA (K-DUR,KLOR-CON) 20 MEQ tablet   Oral   Take 1 tablet (20 mEq total) by mouth 2 (two) times daily.   60 tablet   0   . Tamsulosin HCl (FLOMAX) 0.4 MG CAPS   Oral   Take 0.4 mg by mouth daily after supper.           BP 155/62  Pulse 93  Temp(Src) 98 F (36.7 C) (Oral)  Resp 18  Ht 5\' 9"  (1.753 m)  Wt 295 lb (133.811 kg)  BMI 43.54 kg/m2  SpO2 94% Physical Exam  Nursing note and vitals reviewed. Constitutional: He is oriented to person, place, and time. He appears well-developed and well-nourished. No distress.  HENT:  Head: Normocephalic and atraumatic.  Mouth/Throat: Oropharynx is clear and  moist.  Eyes: Conjunctivae  are normal. Pupils are equal, round, and reactive to light. No scleral icterus.  Neck: Neck supple.  Cardiovascular: Normal rate, regular rhythm, normal heart sounds and intact distal pulses.   No murmur heard. Pulmonary/Chest: Effort normal and breath sounds normal. No stridor. No respiratory distress. He has no wheezes. He has no rales.  Abdominal: Soft. He exhibits no distension. There is no tenderness.  Musculoskeletal: Normal range of motion. He exhibits no edema.  Neurological: He is alert and oriented to person, place, and time. No cranial nerve deficit or sensory deficit. Coordination (mild difficulty with right sided finger to nose, left side normal) and gait (wide, slow) abnormal. GCS eye subscore is 4. GCS verbal subscore is 5. GCS motor subscore is 6.  Mild weakness in RLE.    Skin: Skin is warm and dry. No rash noted.  Psychiatric: He has a normal mood and affect. His behavior is normal.    ED Course  Procedures (including critical care time) Labs Review Labs Reviewed  CBC - Abnormal; Notable for the following:    WBC 14.8 (*)    RDW 17.7 (*)    Platelets 146 (*)    All other components within normal limits  DIFFERENTIAL - Abnormal; Notable for the following:    Neutrophils Relative % 82 (*)    Neutro Abs 12.1 (*)    Lymphocytes Relative 11 (*)    All other components within normal limits  COMPREHENSIVE METABOLIC PANEL - Abnormal; Notable for the following:    Glucose, Bld 129 (*)    Albumin 3.3 (*)    GFR calc non Af Amer 81 (*)    All other components within normal limits  GLUCOSE, CAPILLARY - Abnormal; Notable for the following:    Glucose-Capillary 124 (*)    All other components within normal limits  PROTIME-INR  APTT  TROPONIN I  POCT I-STAT TROPONIN I   Imaging Review Ct Head Wo Contrast  05/03/2013   CLINICAL DATA:  Headache and visual alteration; tinnitus  EXAM: CT HEAD WITHOUT CONTRAST  TECHNIQUE: Contiguous axial images were obtained from the base of  the skull through the vertex without intravenous contrast.  COMPARISON:  June 01, 2011  FINDINGS: The ventricles are normal in size and configuration. There is no mass, hemorrhage, extra-axial fluid collection, or midline shift.  There is decreased attenuation with cytotoxic edema in the posterior mid right cerebellum consistent with recent infarct in this portion of the right cerebellum. Elsewhere gray-white compartments are normal.  Bony calvarium appears intact.  The mastoid air cells are clear.  IMPRESSION: 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 Study otherwise unremarkable.   Electronically Signed   By: Bretta Bang M.D.   On: 05/03/2013 15:50  All radiology studies independently viewed by me.     EKG Interpretation    Date/Time:    Ventricular Rate:    PR Interval:    QRS Duration:   QT Interval:    QTC Calculation:   R Axis:     Text Interpretation:              MDM   1. CVA (cerebral infarction)    72 yo male with CVA seen on outpatient CT.  Symptoms for 1 week, therefore, no code stroke and no tPA.  Symptoms possibly secondary to dissection, but pt has had no change in symptoms since onset.  Discussed with Dr. Amada Jupiter who will have dissection further evaluated  tomorrow (no indication for emergent treatment even if present given stability of symptoms.)  Admitted to hospitalist.      Candyce Churn, MD 05/04/13 [phone]

## 2013-05-03 NOTE — ED Notes (Signed)
MRI states Dr. Amada Jupiter would like MRI to be done in the morning.

## 2013-05-04 ENCOUNTER — Inpatient Hospital Stay (HOSPITAL_COMMUNITY): Payer: Medicare Other

## 2013-05-04 ENCOUNTER — Encounter (HOSPITAL_COMMUNITY): Payer: Self-pay | Admitting: Neurology

## 2013-05-04 DIAGNOSIS — E669 Obesity, unspecified: Secondary | ICD-10-CM

## 2013-05-04 DIAGNOSIS — R209 Unspecified disturbances of skin sensation: Secondary | ICD-10-CM | POA: Diagnosis not present

## 2013-05-04 DIAGNOSIS — I6350 Cerebral infarction due to unspecified occlusion or stenosis of unspecified cerebral artery: Secondary | ICD-10-CM | POA: Diagnosis not present

## 2013-05-04 DIAGNOSIS — R51 Headache: Secondary | ICD-10-CM | POA: Diagnosis not present

## 2013-05-04 DIAGNOSIS — E039 Hypothyroidism, unspecified: Secondary | ICD-10-CM

## 2013-05-04 DIAGNOSIS — R198 Other specified symptoms and signs involving the digestive system and abdomen: Secondary | ICD-10-CM

## 2013-05-04 DIAGNOSIS — I369 Nonrheumatic tricuspid valve disorder, unspecified: Secondary | ICD-10-CM

## 2013-05-04 LAB — LIPID PANEL
HDL: 39 mg/dL — ABNORMAL LOW (ref >39)
Triglycerides: 131 mg/dL (ref <150)

## 2013-05-04 LAB — HEMOGLOBIN A1C
Hgb A1c MFr Bld: 5.3 % (ref <5.7)
Mean Plasma Glucose: 105 mg/dL (ref <117)

## 2013-05-04 LAB — CBC
HCT: 44.6 % (ref 39.0–52.0)
Hemoglobin: 15 g/dL (ref 13.0–17.0)
MCHC: 33.6 g/dL (ref 30.0–36.0)
MCV: 80.2 fL (ref 78.0–100.0)
Platelets: 143 10*3/uL — ABNORMAL LOW (ref 150–400)
RDW: 17.5 % — ABNORMAL HIGH (ref 11.5–15.5)
WBC: 14 10*3/uL — ABNORMAL HIGH (ref 4.0–10.5)

## 2013-05-04 MED ORDER — DORZOLAMIDE HCL-TIMOLOL MAL 2-0.5 % OP SOLN
1.0000 [drp] | Freq: Two times a day (BID) | OPHTHALMIC | Status: DC
  Administered 2013-05-04 – 2013-05-05 (×4): 1 [drp] via OPHTHALMIC
  Filled 2013-05-04: qty 10

## 2013-05-04 MED ORDER — HEPARIN SODIUM (PORCINE) 5000 UNIT/ML IJ SOLN
5000.0000 [IU] | Freq: Three times a day (TID) | INTRAMUSCULAR | Status: DC
  Administered 2013-05-04 – 2013-05-05 (×5): 5000 [IU] via SUBCUTANEOUS
  Filled 2013-05-04 (×7): qty 1

## 2013-05-04 MED ORDER — ACETAMINOPHEN 325 MG PO TABS: 650.0000 mg | ORAL_TABLET | Freq: Four times a day (QID) | ORAL | Status: DC | PRN

## 2013-05-04 MED ORDER — FUROSEMIDE 80 MG PO TABS
80.0000 mg | ORAL_TABLET | Freq: Two times a day (BID) | ORAL | Status: DC
  Administered 2013-05-04 – 2013-05-05 (×3): 80 mg via ORAL
  Filled 2013-05-04 (×5): qty 1

## 2013-05-04 MED ORDER — DOCUSATE SODIUM 100 MG PO CAPS
100.0000 mg | ORAL_CAPSULE | Freq: Every day | ORAL | Status: DC
  Administered 2013-05-04 (×2): 100 mg via ORAL
  Filled 2013-05-04 (×2): qty 1

## 2013-05-04 MED ORDER — CYCLOBENZAPRINE HCL 10 MG PO TABS: 10.0000 mg | ORAL_TABLET | Freq: Three times a day (TID) | ORAL | Status: DC | PRN

## 2013-05-04 MED ORDER — ATORVASTATIN CALCIUM 80 MG PO TABS
80.0000 mg | ORAL_TABLET | Freq: Every day | ORAL | Status: DC
  Administered 2013-05-04 (×2): 80 mg via ORAL
  Filled 2013-05-04 (×4): qty 1

## 2013-05-04 MED ORDER — POLYETHYL GLYCOL-PROPYL GLYCOL 0.4-0.3 % OP SOLN: 1.0000 [drp] | Freq: Every day | OPHTHALMIC | Status: DC | PRN

## 2013-05-04 MED ORDER — OXYCODONE-ACETAMINOPHEN 10-325 MG PO TABS: 1.0000 | ORAL_TABLET | Freq: Four times a day (QID) | ORAL | Status: DC | PRN

## 2013-05-04 MED ORDER — LATANOPROST 0.005 % OP SOLN
1.0000 [drp] | Freq: Every day | OPHTHALMIC | Status: DC
  Administered 2013-05-04 (×2): 1 [drp] via OPHTHALMIC
  Filled 2013-05-04: qty 2.5

## 2013-05-04 MED ORDER — POTASSIUM CHLORIDE CRYS ER 20 MEQ PO TBCR
20.0000 meq | EXTENDED_RELEASE_TABLET | Freq: Two times a day (BID) | ORAL | Status: DC
  Administered 2013-05-04 – 2013-05-05 (×4): 20 meq via ORAL
  Filled 2013-05-04 (×5): qty 1

## 2013-05-04 MED ORDER — GADOBENATE DIMEGLUMINE 529 MG/ML IV SOLN
20.0000 mL | Freq: Once | INTRAVENOUS | Status: AC | PRN
  Administered 2013-05-04: 20 mL via INTRAVENOUS

## 2013-05-04 MED ORDER — TAMSULOSIN HCL 0.4 MG PO CAPS
0.4000 mg | ORAL_CAPSULE | Freq: Every day | ORAL | Status: DC
  Administered 2013-05-04 – 2013-05-05 (×2): 0.4 mg via ORAL
  Filled 2013-05-04 (×2): qty 1

## 2013-05-04 MED ORDER — POLYVINYL ALCOHOL 1.4 % OP SOLN: 1.0000 [drp] | OPHTHALMIC | Status: DC | PRN

## 2013-05-04 MED ORDER — OXYCODONE HCL 5 MG PO TABS
5.0000 mg | ORAL_TABLET | Freq: Four times a day (QID) | ORAL | Status: DC | PRN
  Administered 2013-05-04 – 2013-05-05 (×3): 5 mg via ORAL
  Filled 2013-05-04 (×3): qty 1

## 2013-05-04 MED ORDER — OXYCODONE-ACETAMINOPHEN 5-325 MG PO TABS
1.0000 | ORAL_TABLET | Freq: Four times a day (QID) | ORAL | Status: DC | PRN
  Administered 2013-05-04 – 2013-05-05 (×2): 1 via ORAL
  Filled 2013-05-04 (×2): qty 1

## 2013-05-04 MED ORDER — METOPROLOL SUCCINATE 12.5 MG HALF TABLET
12.5000 mg | ORAL_TABLET | Freq: Every morning | ORAL | Status: DC
  Administered 2013-05-04 – 2013-05-05 (×2): 12.5 mg via ORAL
  Filled 2013-05-04 (×2): qty 1

## 2013-05-04 MED ORDER — PANTOPRAZOLE SODIUM 40 MG PO TBEC
40.0000 mg | DELAYED_RELEASE_TABLET | Freq: Two times a day (BID) | ORAL | Status: DC
  Administered 2013-05-04 – 2013-05-05 (×4): 40 mg via ORAL
  Filled 2013-05-04 (×4): qty 1

## 2013-05-04 MED ORDER — LORATADINE 10 MG PO TABS
10.0000 mg | ORAL_TABLET | Freq: Every day | ORAL | Status: DC
  Administered 2013-05-04 – 2013-05-05 (×2): 10 mg via ORAL
  Filled 2013-05-04 (×2): qty 1

## 2013-05-04 NOTE — Progress Notes (Signed)
  Echocardiogram 2D Echocardiogram has been performed.  [name] [name] 05/04/2013, 10:27 AM

## 2013-05-04 NOTE — H&P (Signed)
[facility] History and Physical  [name] ZOX:[id number] DOB: 1940-08-11 DOA: 05/03/2013  Referring physician: EDP PCP: [name] [name]., MD   Chief Complaint: R sided weakness   HPI: [name] [name] is a 72 y.o. male who presents with 1 week history of R sided weakness.  This was associated with R sided neck pain onset at the same time.  R sided weakness has persisted throughout this week so he presented to his PCP.  PCP ordered CT brain which showed hypodensity in R cerebellum worrisome for new ischemic stroke.  Review of Systems: Systems reviewed.  As above, otherwise negative  Past Medical History  Diagnosis Date  . CHF (congestive heart failure)   . Obesity   . Hyperlipidemia   . PUD (peptic ulcer disease)   . GERD (gastroesophageal reflux disease)   . S/P endoscopy 2007, 2012    2007: nl, May 2012: antral erosions  . Osteoarthritis   . S/P colonoscopy 2007, Feb and March 2012    hx of adenomas, left-sided diverticula, On 07/2010 TCS, fresh blood and clot noted coming from TI.  Marland Kitchen Anxiety   . Thyroid disease     hypothyroid  . Ringing in ears   . History of kidney stones   . History of bladder infections   . Heel spur     right heel  . Collagen vascular disease     history of venous insufficency and LE cellulitis  . Thrombocytopenia 12/30/2011    Stable  . BPH (benign prostatic hyperplasia)   . Sleep apnea     uses oxygen cannot tolerate CPAP  . Hypertension     Sees Dr. Rennis [name]  . Bilateral lower leg cellulitis    Past Surgical History  Procedure Laterality Date  . Knee arthroscopy    . Rotator cuff repair    . Cholecystectomy    . Lumbar spine surgery    . Small bowel capsule study  07/2011    ?transient focal ischemia in distal small bowel to explain GI bleeding  . Esophagogastroduodenoscopy  Aug 2013    [facility], single 5mm pedunculated polyp otherwise normal. HYPERPLASTIC, NEGATIVE h.pylori  . Cardiac catheterization      02/27/07: no sign CAD,  LVH, mod pulm HTN   Social History:  reports that he quit smoking about 39 years ago. His smoking use included Cigars. He quit smokeless tobacco use about 39 years ago. His smokeless tobacco use included Chew. He reports that he does not drink alcohol or use illicit drugs.  Allergies  Allergen Reactions  . Aspirin Other (See Comments)    Nausea and upset stomach      Family History  Problem Relation Age of Onset  . Colon cancer Father 72    deceased  . Prostate cancer Father   . Pancreatic cancer Mother 92    deceased     Prior to Admission medications   Medication Sig Start Date End Date Taking? Authorizing Provider  acetaminophen (TYLENOL) 325 MG tablet Take 650 mg by mouth every 6 (six) hours as needed for pain.   Yes Historical Provider, MD  atorvastatin (LIPITOR) 80 MG tablet Take 80 mg by mouth at bedtime.   Yes Historical Provider, MD  cetirizine (ZYRTEC) 10 MG tablet Take 10 mg by mouth daily.   Yes Historical Provider, MD  cyclobenzaprine (FLEXERIL) 10 MG tablet Take 1 tablet (10 mg total) by mouth 3 (three) times daily as needed for muscle spasms. 08/31/12  Yes [name] Hurt, MD  docusate  sodium (COLACE) 100 MG capsule Take 100 mg by mouth at bedtime.   Yes Historical Provider, MD  Dorzolamide HCl-Timolol Mal PF 22.3-6.8 MG/ML SOLN Apply 1 drop to eye 2 (two) times daily. Dorzolamide HCI-Timolol Maleate Ophthalmic Solution 2.23%/0.68%   Yes Historical Provider, MD  furosemide (LASIX) 80 MG tablet Take 80 mg by mouth 2 (two) times daily.    Yes Historical Provider, MD  latanoprost (XALATAN) 0.005 % ophthalmic solution Place 1 drop into both eyes at bedtime.   Yes Historical Provider, MD  metoprolol succinate (TOPROL-XL) 25 MG 24 hr tablet Take 0.5 tablets (12.5 mg total) by mouth every morning. 02/12/13  Yes [name] Gess, MD  ondansetron (ZOFRAN) 4 MG tablet TAKE ONE TABLET BY MOUTH EVERY 8 HOURS AS NEEDED FOR NAUSEA 05/01/13  Yes [name] Kocher, PA-C   oxyCODONE-acetaminophen (PERCOCET) 10-325 MG per tablet Take 1 tablet by mouth every 6 (six) hours as needed for pain. 08/31/12  Yes [name] Hurt, MD  pantoprazole (PROTONIX) 40 MG tablet Take 40 mg by mouth 2 (two) times daily.   Yes Historical Provider, MD  Polyethyl Glycol-Propyl Glycol (LUBRICANT EYE DROPS) 0.4-0.3 % SOLN Apply 1 drop to eye daily as needed (for lubricant eye drops).   Yes Historical Provider, MD  potassium chloride SA (K-DUR,KLOR-CON) 20 MEQ tablet Take 1 tablet (20 mEq total) by mouth 2 (two) times daily. 06/17/12  Yes [name] Normajean Glasgow, MD  Tamsulosin HCl (FLOMAX) 0.4 MG CAPS Take 0.4 mg by mouth daily after supper.    Yes Historical Provider, MD  Trolamine Salicylate (ASPERCREME EX) Apply 1 application topically every 6 (six) hours as needed (pain).   Yes Historical Provider, MD   Physical Exam: Filed Vitals:   05/03/13 2237  BP: 132/71  Pulse:   Temp: 97.5 F (36.4 C)  Resp: 22    BP 132/71  Pulse 87  Temp(Src) 97.5 F (36.4 C) (Oral)  Resp 22  Ht 5\' 9"  (1.753 m)  Wt 133.811 kg (295 lb)  BMI 43.54 kg/m2  SpO2 97%  General Appearance:    Alert, oriented, no distress, appears stated age  Head:    Normocephalic, atraumatic  Eyes:    PERRL, EOMI, sclera non-icteric        Nose:   Nares without drainage or epistaxis. Mucosa, turbinates normal  Throat:   Moist mucous membranes. Oropharynx without erythema or exudate.  Neck:   Supple. No carotid bruits.  No thyromegaly.  No lymphadenopathy.   Back:     No CVA tenderness, no spinal tenderness  Lungs:     Clear to auscultation bilaterally, without wheezes, rhonchi or rales  Chest wall:    No tenderness to palpitation  Heart:    Regular rate and rhythm without murmurs, gallops, rubs  Abdomen:     Soft, non-tender, nondistended, normal bowel sounds, no organomegaly  Genitalia:    deferred  Rectal:    deferred  Extremities:   No clubbing, cyanosis or edema.  Pulses:   2+ and symmetric all extremities  Skin:    Skin color, texture, turgor normal, no rashes or lesions  Lymph nodes:   Cervical, supraclavicular, and axillary nodes normal  Neurologic:   CNII-XII intact. R sided weakness, sensation and reflexes      Throughout,     Labs on Admission:  Basic Metabolic Panel:  Recent Labs Lab 05/03/13 1820  NA 138  K 4.0  CL 100  CO2 31  GLUCOSE 129*  BUN 15  CREATININE 0.93  CALCIUM 9.4   Liver Function Tests:  Recent Labs Lab 05/03/13 1820  AST 17  ALT 13  ALKPHOS 89  BILITOT 0.7  PROT 6.5  ALBUMIN 3.3*   No results found for this basename: LIPASE, AMYLASE,  in the last 168 hours No results found for this basename: AMMONIA,  in the last 168 hours CBC:  Recent Labs Lab 05/03/13 1820  WBC 14.8*  NEUTROABS 12.1*  HGB 14.7  HCT 44.5  MCV 79.6  PLT 146*   Cardiac Enzymes:  Recent Labs Lab 05/03/13 1820  TROPONINI <0.30    BNP (last 3 results) No results found for this basename: PROBNP,  in the last 8760 hours CBG:  Recent Labs Lab 05/03/13 1820  GLUCAP 124*    Radiological Exams on Admission: Ct Head Wo Contrast  05/03/2013   CLINICAL DATA:  Headache and visual alteration; tinnitus  EXAM: CT HEAD WITHOUT CONTRAST  TECHNIQUE: Contiguous axial images were obtained from the base of the skull through the vertex without intravenous contrast.  COMPARISON:  June 01, 2011  FINDINGS: The ventricles are normal in size and configuration. There is no mass, hemorrhage, extra-axial fluid collection, or midline shift.  There is decreased attenuation with cytotoxic edema in the posterior mid right cerebellum consistent with recent infarct in this portion of the right cerebellum. Elsewhere gray-white compartments are normal.  Bony calvarium appears intact.  The mastoid air cells are clear.  IMPRESSION: 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 Study otherwise unremarkable.   Electronically Signed   By: Bretta Bang M.D.   On:  05/03/2013 15:50    EKG: Independently reviewed.  Assessment/Plan Active Problems:   CVA (cerebral infarction)   1. Sub-acute ischemic cerebellum stroke - Admitting patient under stroke protocol, plavix, MRI ordered, MRA neck, carotid dopplers not ordered per neurology (getting MRA neck), 2d echo.  Neurology has evaluated, PT/OT.    Code Status: Full  Family Communication: Son at bedside Disposition Plan: Admit to inpatient   Time spent: 70 min  Chevi Lim M. [facility] Pager [phone]  If 7AM-7PM, please contact the day team taking care of the patient [email or web address] Password [email or web address] 05/04/2013, 1:20 AM

## 2013-05-04 NOTE — Progress Notes (Signed)
Stroke Team Progress Note  HISTORY Val Schiavo is a 72 y.o. male with a history of CHF, hyperlipidemia, peptic ulcer disease who presented with right-sided numbness following pain to the right side of his neck in the posterior aspect which started about a week ago. He stated that the symptoms came on suddenly at approximately the same time, went to his primary care physician today who performed a CT which showed some hypodensity in the right cerebellum concerning for stroke.   LKW: One week ago  tpa given?: no, outside of window   SUBJECTIVE There no family members present this morning. The patient is having a right-sided headache as well as mildly increased right facial numbness this morning. He just returned from his MRI.  OBJECTIVE Most recent Vital Signs: Filed Vitals:   05/03/13 2237 05/04/13 0035 05/04/13 0300 05/04/13 0414  BP: 132/71 123/78 131/79 111/57  Pulse:  81 78 81  Temp: 97.5 F (36.4 C) 97.6 F (36.4 C) 97.4 F (36.3 C) 97.2 F (36.2 C)  TempSrc: Oral Oral Oral Oral  Resp: 22 20 20 18   Height:  5\' 6"  (1.676 m)    Weight:  131.1 kg (289 lb 0.4 oz)    SpO2: 97% 96% 96% 100%   CBG (last 3)   Recent Labs  05/03/13 1820  GLUCAP 124*    IV Fluid Intake:     MEDICATIONS  . atorvastatin  80 mg Oral QHS  . clopidogrel  75 mg Oral Q breakfast  . docusate sodium  100 mg Oral QHS  . dorzolamide-timolol  1 drop Both Eyes BID  . furosemide  80 mg Oral BID  . gabapentin  300 mg Oral TID  . heparin  5,000 Units Subcutaneous Q8H  . latanoprost  1 drop Both Eyes QHS  . loratadine  10 mg Oral Daily  . metoprolol succinate  12.5 mg Oral q morning - 10a  . pantoprazole  40 mg Oral BID  . potassium chloride SA  20 mEq Oral BID  . tamsulosin  0.4 mg Oral QPC supper   PRN:  acetaminophen, cyclobenzaprine, oxyCODONE, oxyCODONE-acetaminophen, polyvinyl alcohol  Diet:  Cardiac thin liquids Activity:  Bedrest DVT Prophylaxis:  Subcutaneous heparin  CLINICALLY  SIGNIFICANT STUDIES Basic Metabolic Panel:   Recent Labs Lab 05/03/13 1820  NA 138  K 4.0  CL 100  CO2 31  GLUCOSE 129*  BUN 15  CREATININE 0.93  CALCIUM 9.4   Liver Function Tests:   Recent Labs Lab 05/03/13 1820  AST 17  ALT 13  ALKPHOS 89  BILITOT 0.7  PROT 6.5  ALBUMIN 3.3*   CBC:   Recent Labs Lab 05/03/13 1820  WBC 14.8*  NEUTROABS 12.1*  HGB 14.7  HCT 44.5  MCV 79.6  PLT 146*   Coagulation:   Recent Labs Lab 05/03/13 1820  LABPROT 12.7  INR 0.97   Cardiac Enzymes:   Recent Labs Lab 05/03/13 1820  TROPONINI <0.30   Urinalysis: No results found for this basename: COLORURINE, APPERANCEUR, LABSPEC, PHURINE, GLUCOSEU, HGBUR, BILIRUBINUR, KETONESUR, PROTEINUR, UROBILINOGEN, NITRITE, LEUKOCYTESUR,  in the last 168 hours Lipid Panel    Component Value Date/Time   CHOL 115 05/04/2013 0430   TRIG 131 05/04/2013 0430   HDL 39* 05/04/2013 0430   CHOLHDL 2.9 05/04/2013 0430   VLDL 26 05/04/2013 0430   LDLCALC 50 05/04/2013 0430   HgbA1C  Lab Results  Component Value Date   HGBA1C  Value: 4.4 (NOTE) The ADA recommends the following therapeutic goal  for glycemic control related to Hgb A1c measurement: Goal of therapy: <6.5 Hgb A1c  Reference: American Diabetes Association: Clinical Practice Recommendations 2010, Diabetes Care, 2010, 33: (Suppl  1).* 10/02/2008    Urine Drug Screen:   No results found for this basename: labopia,  cocainscrnur,  labbenz,  amphetmu,  thcu,  labbarb    Alcohol Level: No results found for this basename: ETH,  in the last 168 hours  Ct Head Wo Contrast 05/03/2013    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 Study otherwise unremarkable.      MRI of the brain 05/04/2013  Negative for acute infarct. Hypodensity right cerebellum on CT  apparently was on artifact. No acute or chronic infarct or mass is  seen in this area.     MRA of the brain  05/04/2013 Negative MRA of  the head  MRA of neck 05/04/2013 Reveals poor signal in the proximal common carotid  arteries bilaterally likely related to artifact however  atherosclerotic disease not excluded. However, the carotid  bifurcation is widely patent bilaterally suggesting the patient does  not have significant atherosclerotic disease elsewhere.   2D Echocardiogram  pending  Carotid Doppler see MRA of the neck  CXR    EKG sinus rhythm rate 95 beats per minute  Therapy Recommendations pending  Physical exam  Neurologic Exam    Mental Status:  Patient is awake, alert, oriented to person, place, month, year, and situation.  Immediate and remote memory are intact.  Patient is able to give a clear and coherent history.  No signs of aphasia or neglect  Cranial Nerves:  II: Visual Fields are full. Pupils are equal, round, and reactive to light. Discs are difficult to visualize.  III,IV, VI: EOMI without ptosis or diploplia.  V: Facial sensation is decreased on the right  VII: Facial movement is symmetric.  VIII: hearing is intact to voice  X: Uvula elevates symmetrically  XI: Shoulder shrug is symmetric.  XII: tongue is midline without atrophy or fasciculations.  Motor:  Tone is normal. Bulk is normal. BUE and LLE 5/5. RLE 3/5.  Sensory:  Sensation is decreased throughout the right  Deep Tendon Reflexes:  2+ and symmetric in the biceps and patellae.  Cerebellar:  FNF intact. Gait:  Not assessed due to multiple medical monitors in ED setting.    ASSESSMENT Mr. Yair Dusza is a 72 y.o. male presenting with left hemiparesis. No TPA secondary to late presentation. A CT scan of the head suspicious for a recent infarct in the posterior mid right cerebellum with localized cytotoxic edema, but MRI negative for acute infarct. On no antithrombotics prior to admission. Now on Plavix 75 mg daily ( aspirin allergy ) for secondary stroke prevention. Patient with resultant left hemiparesis. Work up  underway.   Leukocytosis  Hyperlipidemia - Lipitor prior to admission  Hypertension history  CHF history  Thrombocytopenia history  Hospital day # 1  TREATMENT/PLAN  Continue clopidogrel 75 mg orally every day for secondary stroke prevention.  MRI/MRA negative for acute stroke.  Await therapy evaluations  2-D echo pending  Monitor platelets and WBCs. Check CBC today.  Delton See PA-C [facility] Pager [phone] 05/04/2013, 10:05 AM  I evaluated and examined patient, reviewed records, labs and imaging, and agree with note and plan. Symptoms suspicious for complicated migraine vs. TIA: (2 weeks ago, felt lightning stroke in right neck, eye, head, saw swirling colors on the ground, associated with HA, nausea  and right sided numbness and weakness. Symptoms fluctuating since then. Today feels better, except for right face numbness and right leg weakness. MRI negative for stroke). Continue stroke workup.  Suanne Marker, MD 05/04/2013, 3:52 PM Certified in Neurology, Neurophysiology and Neuroimaging [facility] - Stroke Team  Please refer to [email or web address] for on-call Stroke MD

## 2013-05-04 NOTE — Progress Notes (Signed)
[facility] PROGRESS NOTE  [name] [name] YQM:[id number] DOB: 1940-08-10 DOA: 05/03/2013 PCP: Cassell Smiles., MD  Assessment/Plan: 1. Sub-acute ischemic cerebellum stroke  1. Cont  plavix 2. MRI MRA head/neck done, awaiting results 3. 2d echo pending 4. PT/OT 5. Neurology following 2. Hx CHF 1. 2D echo pending 2. Last documented echo from 2010 3. Cont on lasix per home regimen 3. HTN 1. BP stable and controlled 2. Cont current regimen 4. DVT prophylaxis 1. Heparin subq  Code Status: Full Family Communication: Pt in room (indicate person spoken with, relationship, and if by phone, the number) Disposition Plan: Pending   Consultants:  Neurology  Procedures:    Antibiotics:  None  HPI/Subjective: No acute events noted overnight  Objective: Filed Vitals:   05/03/13 2237 05/04/13 0035 05/04/13 0300 05/04/13 0414  BP: 132/71 123/78 131/79 111/57  Pulse:  81 78 81  Temp: 97.5 F (36.4 C) 97.6 F (36.4 C) 97.4 F (36.3 C) 97.2 F (36.2 C)  TempSrc: Oral Oral Oral Oral  Resp: 22 20 20 18   Height:  5\' 6"  (1.676 m)    Weight:  131.1 kg (289 lb 0.4 oz)    SpO2: 97% 96% 96% 100%    Intake/Output Summary (Last 24 hours) at 05/04/13 1018 Last data filed at 05/04/13 0313  Gross per 24 hour  Intake      0 ml  Output    300 ml  Net   -300 ml   Filed Weights   05/03/13 1813 05/04/13 0035  Weight: 133.811 kg (295 lb) 131.1 kg (289 lb 0.4 oz)    Exam:   General:  Awake, in nad  Cardiovascular: regular, s1, s2  Respiratory: normal resp effort, no wheezing  Abdomen: soft, nondistended  Musculoskeletal: perfused, no clubbing   Data Reviewed: Basic Metabolic Panel:  Recent Labs Lab 05/03/13 1820  NA 138  K 4.0  CL 100  CO2 31  GLUCOSE 129*  BUN 15  CREATININE 0.93  CALCIUM 9.4   Liver Function Tests:  Recent Labs Lab 05/03/13 1820  AST 17  ALT 13  ALKPHOS 89  BILITOT 0.7  PROT 6.5  ALBUMIN 3.3*   No results found for this  basename: LIPASE, AMYLASE,  in the last 168 hours No results found for this basename: AMMONIA,  in the last 168 hours CBC:  Recent Labs Lab 05/03/13 1820  WBC 14.8*  NEUTROABS 12.1*  HGB 14.7  HCT 44.5  MCV 79.6  PLT 146*   Cardiac Enzymes:  Recent Labs Lab 05/03/13 1820  TROPONINI <0.30   BNP (last 3 results) No results found for this basename: PROBNP,  in the last 8760 hours CBG:  Recent Labs Lab 05/03/13 1820  GLUCAP 124*    No results found for this or any previous visit (from the past 240 hour(s)).   Studies: Ct Head Wo Contrast  05/03/2013   CLINICAL DATA:  Headache and visual alteration; tinnitus  EXAM: CT HEAD WITHOUT CONTRAST  TECHNIQUE: Contiguous axial images were obtained from the base of the skull through the vertex without intravenous contrast.  COMPARISON:  June 01, 2011  FINDINGS: The ventricles are normal in size and configuration. There is no mass, hemorrhage, extra-axial fluid collection, or midline shift.  There is decreased attenuation with cytotoxic edema in the posterior mid right cerebellum consistent with recent infarct in this portion of the right cerebellum. Elsewhere gray-white compartments are normal.  Bony calvarium appears intact.  The mastoid air cells are clear.  IMPRESSION: Recent  infarct in the posterior mid right cerebellum with localized cytotoxic edema. Study otherwise unremarkable.   Electronically Signed   By: Bretta Bang M.D.   On: 05/03/2013 15:50    Scheduled Meds: . atorvastatin  80 mg Oral QHS  . clopidogrel  75 mg Oral Q breakfast  . docusate sodium  100 mg Oral QHS  . dorzolamide-timolol  1 drop Both Eyes BID  . furosemide  80 mg Oral BID  . gabapentin  300 mg Oral TID  . heparin  5,000 Units Subcutaneous Q8H  . latanoprost  1 drop Both Eyes QHS  . loratadine  10 mg Oral Daily  . metoprolol succinate  12.5 mg Oral q morning - 10a  . pantoprazole  40 mg Oral BID  . potassium chloride SA  20 mEq Oral BID  .  tamsulosin  0.4 mg Oral QPC supper   Continuous Infusions:   Active Problems:   CVA (cerebral infarction)  Time spent:  [name] K  [facility] Pager [phone]. If 7PM-7AM, please contact night-coverage at [email or web address], password [facility] 05/04/2013, 10:18 AM  LOS: 1 day

## 2013-05-05 DIAGNOSIS — G43909 Migraine, unspecified, not intractable, without status migrainosus: Secondary | ICD-10-CM | POA: Diagnosis not present

## 2013-05-05 DIAGNOSIS — K5900 Constipation, unspecified: Secondary | ICD-10-CM | POA: Diagnosis not present

## 2013-05-05 DIAGNOSIS — I6350 Cerebral infarction due to unspecified occlusion or stenosis of unspecified cerebral artery: Secondary | ICD-10-CM | POA: Diagnosis not present

## 2013-05-05 DIAGNOSIS — E669 Obesity, unspecified: Secondary | ICD-10-CM | POA: Diagnosis not present

## 2013-05-05 MED ORDER — VERAPAMIL HCL 80 MG PO TABS: 80.0000 mg | ORAL_TABLET | Freq: Every day | ORAL | Status: DC

## 2013-05-05 MED ORDER — CLOPIDOGREL BISULFATE 75 MG PO TABS: 75.0000 mg | ORAL_TABLET | Freq: Every day | ORAL | Status: DC

## 2013-05-05 NOTE — Progress Notes (Signed)
[facility] PROGRESS NOTE  [name] [name] ZOX:[id number] DOB: [birth year] DOA: 05/03/2013 PCP: Cassell Smiles., MD  Assessment/Plan: 1. TIA vs complex migraine  1. Cont  plavix 2. MRI MRA head/neck done, no acute CVA seen, suggesting artifact on initial CT finding 3. 2d echo unremarkable 4. PT/OT 5. Neurology following 6. ? Need for migraine prophylaxis 2. Hx CHF 1. 2D echo unremarkable with EF 65-70% with grade 1 diastolic dysfunction 2. Cont on lasix per home regimen 3. HTN 1. BP stable and controlled 2. Cont current regimen 4. DVT prophylaxis 1. Heparin subq  Code Status: Full Family Communication: Pt in room (indicate person spoken with, relationship, and if by phone, the number) Disposition Plan: Pending dispo recs per PT/OT   Consultants:  Neurology  Procedures:    Antibiotics:  None  HPI/Subjective: Reports R sided headache associated w/ nausea and radiation down R neck and R sided weakness. Gradually resolving.  Objective: Filed Vitals:   05/04/13 1316 05/04/13 1826 05/04/13 2055 05/05/13 0035  BP: 118/67 129/58 134/84 153/88  Pulse: 81 79 77 88  Temp: 97.5 F (36.4 C) 97.8 F (36.6 C) 98.1 F (36.7 C) 97.8 F (36.6 C)  TempSrc: Oral Oral Oral Oral  Resp: 18 18 18 20   Height:      Weight:      SpO2: 94% 94% 94% 97%   No intake or output data in the 24 hours ending 05/05/13 0832 Filed Weights   05/03/13 1813 05/04/13 0035  Weight: 133.811 kg (295 lb) 131.1 kg (289 lb 0.4 oz)    Exam:   General:  Awake, in nad  Cardiovascular: regular, s1, s2  Respiratory: normal resp effort, no wheezing  Abdomen: soft, nondistended  Musculoskeletal: perfused, no clubbing   Data Reviewed: Basic Metabolic Panel:  Recent Labs Lab 05/03/13 1820  NA 138  K 4.0  CL 100  CO2 31  GLUCOSE 129*  BUN 15  CREATININE 0.93  CALCIUM 9.4   Liver Function Tests:  Recent Labs Lab 05/03/13 1820  AST 17  ALT 13  ALKPHOS 89  BILITOT 0.7   PROT 6.5  ALBUMIN 3.3*   No results found for this basename: LIPASE, AMYLASE,  in the last 168 hours No results found for this basename: AMMONIA,  in the last 168 hours CBC:  Recent Labs Lab 05/03/13 1820 05/04/13 1305  WBC 14.8* 14.0*  NEUTROABS 12.1*  --   HGB 14.7 15.0  HCT 44.5 44.6  MCV 79.6 80.2  PLT 146* 143*   Cardiac Enzymes:  Recent Labs Lab 05/03/13 1820  TROPONINI <0.30   BNP (last 3 results) No results found for this basename: PROBNP,  in the last 8760 hours CBG:  Recent Labs Lab 05/03/13 1820  GLUCAP 124*    No results found for this or any previous visit (from the past 240 hour(s)).   Studies: Ct Head Wo Contrast  05/03/2013   CLINICAL DATA:  Headache and visual alteration; tinnitus  EXAM: CT HEAD WITHOUT CONTRAST  TECHNIQUE: Contiguous axial images were obtained from the base of the skull through the vertex without intravenous contrast.  COMPARISON:  June 01, 2011  FINDINGS: The ventricles are normal in size and configuration. There is no mass, hemorrhage, extra-axial fluid collection, or midline shift.  There is decreased attenuation with cytotoxic edema in the posterior mid right cerebellum consistent with recent infarct in this portion of the right cerebellum. Elsewhere gray-white compartments are normal.  Bony calvarium appears intact.  The mastoid air cells are clear.  IMPRESSION: 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 Study otherwise unremarkable.   Electronically Signed   By: Bretta Bang M.D.   On: 05/03/2013 15:50   Mr Angiogram Head Wo Contrast  05/04/2013   CLINICAL DATA:  Stroke right-sided pain and numbness. . Abnormal CT.  EXAM: MRI HEAD WITHOUT AND WITH CONTRAST  MRA HEAD WITHOUT CONTRAST  MRA NECK WITHOUT AND WITH CONTRAST  TECHNIQUE: Multiplanar, multiecho pulse sequences of the brain and surrounding structures were obtained without and with intravenous contrast. Angiographic images of  the Circle of Willis were obtained using MRA technique without intravenous contrast. Angiographic images of the neck were obtained using MRA technique without and with intravenous contrast. Carotid stenosis measurements (when applicable) are obtained utilizing NASCET criteria, using the distal internal carotid diameter as the denominator.  CONTRAST:  20mL MULTIHANCE GADOBENATE DIMEGLUMINE 529 MG/ML IV SOLN  COMPARISON:  CT head 05/03/2013  FINDINGS: MRI HEAD FINDINGS  Negative for acute infarct. Hypodensity right cerebellum on CT apparently was on artifact. No acute or chronic infarct or mass is seen in this area.  No significant chronic ischemia. Negative for demyelinating disease. Cerebral white matter is normal. Brainstem and cerebellum are normal.  Negative for intracranial hemorrhage.  Negative for mass or edema.  Postcontrast imaging reveals normal enhancement. There is mild motion on the postcontrast images.  MRA HEAD FINDINGS  MRA image quality degraded by motion.  Both vertebral arteries are patent to the basilar. Posterior inferior cerebellar artery is patent bilaterally. The basilar is widely patent. Superior cerebellar and posterior cerebral arteries are patent bilaterally without significant stenosis.  Internal carotid artery is widely patent without stenosis. Anterior and middle cerebral arteries are patent bilaterally without significant stenosis.  Negative for cerebral aneurysm.  MRA NECK FINDINGS  Three vessel aortic arch.  There is limited signal in the proximal left the common carotid artery. This is most compatible with artifact rather than atherosclerosis. The left carotid bifurcation is widely patent without significant stenosis.  Poor signal in the proximal right common carotid artery. This is most likely due to tortuosity and artifact however proximal right common carotid artery stenosis cannot be excluded. 50% diameter stenosis proximal right external carotid artery. Internal carotid artery  widely patent.  Both vertebral arteries are patent to the basilar without significant stenosis.  IMPRESSION: No acute intracranial abnormality. No significant intracranial abnormality is detected.  Negative MRA of the head  MRA of the neck reveals poor signal in the proximal common carotid arteries bilaterally likely related to artifact however atherosclerotic disease not excluded. However, the carotid bifurcation is widely patent bilaterally suggesting the patient does not have significant atherosclerotic disease elsewhere.   Electronically Signed   By: Marlan Palau M.D.   On: 05/04/2013 10:30   Mr Angiogram Neck W Wo Contrast  05/04/2013   CLINICAL DATA:  Stroke right-sided pain and numbness. . Abnormal CT.  EXAM: MRI HEAD WITHOUT AND WITH CONTRAST  MRA HEAD WITHOUT CONTRAST  MRA NECK WITHOUT AND WITH CONTRAST  TECHNIQUE: Multiplanar, multiecho pulse sequences of the brain and surrounding structures were obtained without and with intravenous contrast. Angiographic images of the Circle of Willis were obtained using MRA technique without intravenous contrast. Angiographic images of the neck were obtained using MRA technique without and with intravenous contrast. Carotid stenosis measurements (when applicable) are obtained utilizing NASCET criteria, using the distal internal carotid diameter as the denominator.  CONTRAST:  20mL MULTIHANCE GADOBENATE DIMEGLUMINE 529 MG/ML IV SOLN  COMPARISON:  CT head 05/03/2013  FINDINGS: MRI HEAD FINDINGS  Negative for acute infarct. Hypodensity right cerebellum on CT apparently was on artifact. No acute or chronic infarct or mass is seen in this area.  No significant chronic ischemia. Negative for demyelinating disease. Cerebral white matter is normal. Brainstem and cerebellum are normal.  Negative for intracranial hemorrhage.  Negative for mass or edema.  Postcontrast imaging reveals normal enhancement. There is mild motion on the postcontrast images.  MRA HEAD FINDINGS  MRA  image quality degraded by motion.  Both vertebral arteries are patent to the basilar. Posterior inferior cerebellar artery is patent bilaterally. The basilar is widely patent. Superior cerebellar and posterior cerebral arteries are patent bilaterally without significant stenosis.  Internal carotid artery is widely patent without stenosis. Anterior and middle cerebral arteries are patent bilaterally without significant stenosis.  Negative for cerebral aneurysm.  MRA NECK FINDINGS  Three vessel aortic arch.  There is limited signal in the proximal left the common carotid artery. This is most compatible with artifact rather than atherosclerosis. The left carotid bifurcation is widely patent without significant stenosis.  Poor signal in the proximal right common carotid artery. This is most likely due to tortuosity and artifact however proximal right common carotid artery stenosis cannot be excluded. 50% diameter stenosis proximal right external carotid artery. Internal carotid artery widely patent.  Both vertebral arteries are patent to the basilar without significant stenosis.  IMPRESSION: No acute intracranial abnormality. No significant intracranial abnormality is detected.  Negative MRA of the head  MRA of the neck reveals poor signal in the proximal common carotid arteries bilaterally likely related to artifact however atherosclerotic disease not excluded. However, the carotid bifurcation is widely patent bilaterally suggesting the patient does not have significant atherosclerotic disease elsewhere.   Electronically Signed   By: Marlan Palau M.D.   On: 05/04/2013 10:30   Mr Laqueta Jean ZO Contrast  05/04/2013   CLINICAL DATA:  Stroke right-sided pain and numbness. . Abnormal CT.  EXAM: MRI HEAD WITHOUT AND WITH CONTRAST  MRA HEAD WITHOUT CONTRAST  MRA NECK WITHOUT AND WITH CONTRAST  TECHNIQUE: Multiplanar, multiecho pulse sequences of the brain and surrounding structures were obtained without and with  intravenous contrast. Angiographic images of the Circle of Willis were obtained using MRA technique without intravenous contrast. Angiographic images of the neck were obtained using MRA technique without and with intravenous contrast. Carotid stenosis measurements (when applicable) are obtained utilizing NASCET criteria, using the distal internal carotid diameter as the denominator.  CONTRAST:  20mL MULTIHANCE GADOBENATE DIMEGLUMINE 529 MG/ML IV SOLN  COMPARISON:  CT head 05/03/2013  FINDINGS: MRI HEAD FINDINGS  Negative for acute infarct. Hypodensity right cerebellum on CT apparently was on artifact. No acute or chronic infarct or mass is seen in this area.  No significant chronic ischemia. Negative for demyelinating disease. Cerebral white matter is normal. Brainstem and cerebellum are normal.  Negative for intracranial hemorrhage.  Negative for mass or edema.  Postcontrast imaging reveals normal enhancement. There is mild motion on the postcontrast images.  MRA HEAD FINDINGS  MRA image quality degraded by motion.  Both vertebral arteries are patent to the basilar. Posterior inferior cerebellar artery is patent bilaterally. The basilar is widely patent. Superior cerebellar and posterior cerebral arteries are patent bilaterally without significant stenosis.  Internal carotid artery is widely patent without stenosis. Anterior and middle cerebral arteries are patent bilaterally without significant stenosis.  Negative for cerebral aneurysm.  MRA NECK FINDINGS  Three vessel aortic arch.  There is limited signal in the proximal left the common carotid artery. This is most compatible with artifact rather than atherosclerosis. The left carotid bifurcation is widely patent without significant stenosis.  Poor signal in the proximal right common carotid artery. This is most likely due to tortuosity and artifact however proximal right common carotid artery stenosis cannot be excluded. 50% diameter stenosis proximal right  external carotid artery. Internal carotid artery widely patent.  Both vertebral arteries are patent to the basilar without significant stenosis.  IMPRESSION: No acute intracranial abnormality. No significant intracranial abnormality is detected.  Negative MRA of the head  MRA of the neck reveals poor signal in the proximal common carotid arteries bilaterally likely related to artifact however atherosclerotic disease not excluded. However, the carotid bifurcation is widely patent bilaterally suggesting the patient does not have significant atherosclerotic disease elsewhere.   Electronically Signed   By: Marlan Palau M.D.   On: 05/04/2013 10:30    Scheduled Meds: . atorvastatin  80 mg Oral QHS  . clopidogrel  75 mg Oral Q breakfast  . docusate sodium  100 mg Oral QHS  . dorzolamide-timolol  1 drop Both Eyes BID  . furosemide  80 mg Oral BID  . gabapentin  300 mg Oral TID  . heparin  5,000 Units Subcutaneous Q8H  . latanoprost  1 drop Both Eyes QHS  . loratadine  10 mg Oral Daily  . metoprolol succinate  12.5 mg Oral q morning - 10a  . pantoprazole  40 mg Oral BID  . potassium chloride SA  20 mEq Oral BID  . tamsulosin  0.4 mg Oral QPC supper   Continuous Infusions:   Active Problems:   CVA (cerebral infarction)  Time spent:  CHIU, STEPHEN K  [facility] Pager [phone]. If 7PM-7AM, please contact night-coverage at [email or web address], password [facility] 05/05/2013, 8:32 AM  LOS: 2 days

## 2013-05-05 NOTE — Evaluation (Signed)
Occupational Therapy Evaluation Patient Details Name: [name] [name] MRN: [id number] DOB: Aug 09, 1940 Today's Date: 05/05/2013 Time: 4098-1191 OT Time Calculation (min): 29 min  OT Assessment / Plan / Recommendation History of present illness   Fitzroy Mikami is a 72 y.o. male with a history of CHF, hyperlipidemia, peptic ulcer disease who presented with right-sided numbness following pain to the right side of his neck in the posterior aspect which started about a week ago. He stated that the symptoms came on suddenly at approximately the same time, went to his primary care physician today who performed a CT which showed some hypodensity in the right cerebellum concerning for stroke.     Clinical Impression   Pt admitted with above. Presents with decreased strength and coordination in RUE along with below problem list. Will continue to follow acutely. Recommend 24/7 supervision/assist at home.    OT Assessment  Patient needs continued OT Services    Follow Up Recommendations  No OT follow up;Supervision/Assistance - 24 hour    Barriers to Discharge      Equipment Recommendations  None recommended by OT    Recommendations for Other Services    Frequency  Min 3X/week    Precautions / Restrictions     Pertinent Vitals/Pain See vitals    ADL  Eating/Feeding: Performed;Modified independent Where Assessed - Eating/Feeding: Chair Grooming: Performed;Wash/dry hands;Supervision/safety Where Assessed - Grooming: Unsupported standing Toilet Transfer: Performed;Min guard Statistician Method: Sit to Barista: Comfort height toilet Toileting - Clothing Manipulation and Hygiene: Performed;Min guard Where Assessed - Engineer, mining and Hygiene: Sit to stand from 3-in-1 or toilet Equipment Used: Gait belt;Rolling walker Transfers/Ambulation Related to ADLs: Min guard with RW. ADL Comments: Pt able to reach for objects with right hand but  increased difficulty to maintain grasp.  Educated pt on continuing to incorporate use of RUE during functional tasks to help improve coordination, and pt verbalized understanding, states he has been doing this.      OT Diagnosis: Paresis  OT Problem List: Decreased strength;Impaired UE functional use;Impaired sensation OT Treatment Interventions: Self-care/ADL training;Neuromuscular education;DME and/or AE instruction;Therapeutic activities;Patient/family education;Balance training   OT Goals(Current goals can be found in the care plan section) Acute Rehab OT Goals Patient Stated Goal: to return home OT Goal Formulation: With patient Time For Goal Achievement: 05/12/13 Potential to Achieve Goals: Good  Visit Information          Prior Functioning     Home Living Family/patient expects to be discharged to:: Private residence Living Arrangements: Spouse/significant other Available Help at Discharge: Family;Available 24 hours/day Type of Home: House Home Access: Stairs to enter [employer] of Steps: 3 Entrance Stairs-Rails: None Home Layout: One level Home Equipment: Bedside commode;Shower seat;Adaptive equipment Adaptive Equipment: Reacher Additional Comments: Wife unable to assist- she is under hospice care.  Pt states his children provide near 24/7 assist. pt sleeps in his recliner chair and has for 3 years, oxygen PRN at home- he reports usually 2-3 nights per week Prior Function Level of Independence: Needs assistance ADL's / Homemaking Assistance Needed: His sons help him with tub transfers.  Otherwise he is independent with his ADLs.    Communication Communication: No difficulties Dominant Hand: Right         Vision/Perception Vision - History Baseline Vision:  (some blurriness at baseline) Patient Visual Report: Blurring of vision Vision - Assessment Vision Assessment: Vision impaired - to be further tested in functional context Additional Comments:  Pt states vision is slightly  blurrier than usual.  WFL during mobility and ADLs. Will continue to assess.   Cognition  Cognition Arousal/Alertness: Awake/alert Behavior During Therapy: WFL for tasks assessed/performed Overall Cognitive Status: Within Functional Limits for tasks assessed    Extremity/Trunk Assessment Upper Extremity Assessment Upper Extremity Assessment: RUE deficits/detail RUE Deficits / Details: 3+/5 shoulder extension and 4/5 throughout rest of UE. RUE Sensation: decreased light touch;decreased proprioception RUE Coordination: decreased fine motor;decreased gross motor     Mobility Bed Mobility Bed Mobility: Not assessed (pt up in chair) Transfers Transfers: Sit to Stand;Stand to Sit Sit to Stand: 4: Min guard;From chair/3-in-1;From toilet;With upper extremity assist Stand to Sit: 4: Min guard;To chair/3-in-1;To toilet;With upper extremity assist Details for Transfer Assistance: Min guard for safety only. No physical assist needed.     Exercise     Balance     End of Session OT - End of Session Equipment Utilized During Treatment: Gait belt;Rolling walker Activity Tolerance: Patient tolerated treatment well Patient left: in chair;with call bell/phone within reach Nurse Communication: Mobility status  GO   05/05/2013 Cipriano Mile OTR/L Pager [phone] Office [phone]   Cipriano Mile 05/05/2013, 2:02 PM

## 2013-05-05 NOTE — Discharge Summary (Signed)
Physician Discharge Summary  [name] [name] ZOX:[id number] DOB: 1941-01-02 DOA: 05/03/2013  PCP: Cassell Smiles., MD  Admit date: 05/03/2013 Discharge date: 05/05/2013  Time spent: 35 minutes  Recommendations for Outpatient Follow-up:  1. Follow up with PCP in 1-2 weeks  Discharge Diagnoses:  Active Problems:   CVA (cerebral infarction)   Discharge Condition: Stable  Diet recommendation: Heart Healthy  Filed Weights   05/03/13 1813 05/04/13 0035  Weight: 133.811 kg (295 lb) 131.1 kg (289 lb 0.4 oz)    History of present illness:  [name] [name] is a 72 y.o. male who presents with 1 week history of R sided weakness. This was associated with R sided neck pain onset at the same time. R sided weakness has persisted throughout this week so he presented to his PCP. PCP ordered CT brain which showed hypodensity in R cerebellum worrisome for new ischemic stroke.  Hospital Course:  TIA vs complex migraine  1. Neurology recs to cont plavix for now 2. MRI MRA head/neck done, no acute CVA seen, suggesting artifact on initial CT finding 3. 2d echo unremarkable 4. No OT needs 5. Per Neurology, would recommend low dose verapamil for migraine prophylaxis Hx CHF  1. 2D echo unremarkable with EF 65-70% with grade 1 diastolic dysfunction 2. Cont on lasix per home regimen HTN  1. BP remained stable and controlled 2. Cont on home meds DVT prophylaxis  1. Heparin subq while in hospital  Procedures:  2D echo  Consultations:  Neurology  Discharge Exam: Filed Vitals:   05/04/13 2055 05/05/13 0035 05/05/13 1014 05/05/13 1526  BP: 134/84 153/88 118/54 131/77  Pulse: 77 88 89 81  Temp: 98.1 F (36.7 C) 97.8 F (36.6 C) 97.3 F (36.3 C) 97.7 F (36.5 C)  TempSrc: Oral Oral Oral Oral  Resp: 18 20 18 18   Height:      Weight:      SpO2: 94% 97% 97% 96%    General: Awake, in nad Cardiovascular: regular, s1, s2 Respiratory: normal resp effort, no wheezing  Discharge  Instructions       Future Appointments Provider Department Dept Phone   07/05/2013 10:00 AM [facility] [phone]   07/09/2013 10:30 AM Claudia [facility] [phone]       Medication List         acetaminophen 325 MG tablet  Commonly known as:  TYLENOL  Take 650 mg by mouth every 6 (six) hours as needed for pain.     ASPERCREME EX  Apply 1 application topically every 6 (six) hours as needed (pain).     atorvastatin 80 MG tablet  Commonly known as:  LIPITOR  Take 80 mg by mouth at bedtime.     cetirizine 10 MG tablet  Commonly known as:  ZYRTEC  Take 10 mg by mouth daily.     clopidogrel 75 MG tablet  Commonly known as:  PLAVIX  Take 1 tablet (75 mg total) by mouth daily with breakfast.     cyclobenzaprine 10 MG tablet  Commonly known as:  FLEXERIL  Take 1 tablet (10 mg total) by mouth 3 (three) times daily as needed for muscle spasms.     docusate sodium 100 MG capsule  Commonly known as:  COLACE  Take 100 mg by mouth at bedtime.     Dorzolamide HCl-Timolol Mal PF 22.3-6.8 MG/ML Soln  Apply 1 drop to eye 2 (two) times daily. Dorzolamide HCI-Timolol Maleate Ophthalmic Solution 2.23%/0.68%  furosemide 80 MG tablet  Commonly known as:  LASIX  Take 80 mg by mouth 2 (two) times daily.     latanoprost 0.005 % ophthalmic solution  Commonly known as:  XALATAN  Place 1 drop into both eyes at bedtime.     LUBRICANT EYE DROPS 0.4-0.3 % Soln  Generic drug:  Polyethyl Glycol-Propyl Glycol  Apply 1 drop to eye daily as needed (for lubricant eye drops).     metoprolol succinate 25 MG 24 hr tablet  Commonly known as:  TOPROL-XL  Take 0.5 tablets (12.5 mg total) by mouth every morning.     ondansetron 4 MG tablet  Commonly known as:  ZOFRAN  TAKE ONE TABLET BY MOUTH EVERY 8 HOURS AS NEEDED FOR NAUSEA     oxyCODONE-acetaminophen 10-325 MG per tablet  Commonly known as:  PERCOCET  Take 1 tablet by mouth every 6  (six) hours as needed for pain.     pantoprazole 40 MG tablet  Commonly known as:  PROTONIX  Take 40 mg by mouth 2 (two) times daily.     potassium chloride SA 20 MEQ tablet  Commonly known as:  K-DUR,KLOR-CON  Take 1 tablet (20 mEq total) by mouth 2 (two) times daily.     tamsulosin 0.4 MG Caps capsule  Commonly known as:  FLOMAX  Take 0.4 mg by mouth daily after supper.     verapamil 80 MG tablet  Commonly known as:  CALAN  Take 1 tablet (80 mg total) by mouth daily.       Allergies  Allergen Reactions  . Aspirin Other (See Comments)    Nausea and upset stomach        The results of significant diagnostics from this hospitalization (including imaging, microbiology, ancillary and laboratory) are listed below for reference.    Significant Diagnostic Studies: Ct Head Wo Contrast  05/03/2013   CLINICAL DATA:  Headache and visual alteration; tinnitus  EXAM: CT HEAD WITHOUT CONTRAST  TECHNIQUE: Contiguous axial images were obtained from the base of the skull through the vertex without intravenous contrast.  COMPARISON:  June 01, 2011  FINDINGS: The ventricles are normal in size and configuration. There is no mass, hemorrhage, extra-axial fluid collection, or midline shift.  There is decreased attenuation with cytotoxic edema in the posterior mid right cerebellum consistent with recent infarct in this portion of the right cerebellum. Elsewhere gray-white compartments are normal.  Bony calvarium appears intact.  The mastoid air cells are clear.  IMPRESSION: 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 Study otherwise unremarkable.   Electronically Signed   By: Bretta Bang M.D.   On: 05/03/2013 15:50   Mr Angiogram Head Wo Contrast  05/04/2013   CLINICAL DATA:  Stroke right-sided pain and numbness. . Abnormal CT.  EXAM: MRI HEAD WITHOUT AND WITH CONTRAST  MRA HEAD WITHOUT CONTRAST  MRA NECK WITHOUT AND WITH CONTRAST  TECHNIQUE:  Multiplanar, multiecho pulse sequences of the brain and surrounding structures were obtained without and with intravenous contrast. Angiographic images of the Circle of Willis were obtained using MRA technique without intravenous contrast. Angiographic images of the neck were obtained using MRA technique without and with intravenous contrast. Carotid stenosis measurements (when applicable) are obtained utilizing NASCET criteria, using the distal internal carotid diameter as the denominator.  CONTRAST:  20mL MULTIHANCE GADOBENATE DIMEGLUMINE 529 MG/ML IV SOLN  COMPARISON:  CT head 05/03/2013  FINDINGS: MRI HEAD FINDINGS  Negative for acute infarct. Hypodensity right cerebellum on  CT apparently was on artifact. No acute or chronic infarct or mass is seen in this area.  No significant chronic ischemia. Negative for demyelinating disease. Cerebral white matter is normal. Brainstem and cerebellum are normal.  Negative for intracranial hemorrhage.  Negative for mass or edema.  Postcontrast imaging reveals normal enhancement. There is mild motion on the postcontrast images.  MRA HEAD FINDINGS  MRA image quality degraded by motion.  Both vertebral arteries are patent to the basilar. Posterior inferior cerebellar artery is patent bilaterally. The basilar is widely patent. Superior cerebellar and posterior cerebral arteries are patent bilaterally without significant stenosis.  Internal carotid artery is widely patent without stenosis. Anterior and middle cerebral arteries are patent bilaterally without significant stenosis.  Negative for cerebral aneurysm.  MRA NECK FINDINGS  Three vessel aortic arch.  There is limited signal in the proximal left the common carotid artery. This is most compatible with artifact rather than atherosclerosis. The left carotid bifurcation is widely patent without significant stenosis.  Poor signal in the proximal right common carotid artery. This is most likely due to tortuosity and artifact  however proximal right common carotid artery stenosis cannot be excluded. 50% diameter stenosis proximal right external carotid artery. Internal carotid artery widely patent.  Both vertebral arteries are patent to the basilar without significant stenosis.  IMPRESSION: No acute intracranial abnormality. No significant intracranial abnormality is detected.  Negative MRA of the head  MRA of the neck reveals poor signal in the proximal common carotid arteries bilaterally likely related to artifact however atherosclerotic disease not excluded. However, the carotid bifurcation is widely patent bilaterally suggesting the patient does not have significant atherosclerotic disease elsewhere.   Electronically Signed   By: Marlan Palau M.D.   On: 05/04/2013 10:30   Mr Angiogram Neck W Wo Contrast  05/04/2013   CLINICAL DATA:  Stroke right-sided pain and numbness. . Abnormal CT.  EXAM: MRI HEAD WITHOUT AND WITH CONTRAST  MRA HEAD WITHOUT CONTRAST  MRA NECK WITHOUT AND WITH CONTRAST  TECHNIQUE: Multiplanar, multiecho pulse sequences of the brain and surrounding structures were obtained without and with intravenous contrast. Angiographic images of the Circle of Willis were obtained using MRA technique without intravenous contrast. Angiographic images of the neck were obtained using MRA technique without and with intravenous contrast. Carotid stenosis measurements (when applicable) are obtained utilizing NASCET criteria, using the distal internal carotid diameter as the denominator.  CONTRAST:  20mL MULTIHANCE GADOBENATE DIMEGLUMINE 529 MG/ML IV SOLN  COMPARISON:  CT head 05/03/2013  FINDINGS: MRI HEAD FINDINGS  Negative for acute infarct. Hypodensity right cerebellum on CT apparently was on artifact. No acute or chronic infarct or mass is seen in this area.  No significant chronic ischemia. Negative for demyelinating disease. Cerebral white matter is normal. Brainstem and cerebellum are normal.  Negative for intracranial  hemorrhage.  Negative for mass or edema.  Postcontrast imaging reveals normal enhancement. There is mild motion on the postcontrast images.  MRA HEAD FINDINGS  MRA image quality degraded by motion.  Both vertebral arteries are patent to the basilar. Posterior inferior cerebellar artery is patent bilaterally. The basilar is widely patent. Superior cerebellar and posterior cerebral arteries are patent bilaterally without significant stenosis.  Internal carotid artery is widely patent without stenosis. Anterior and middle cerebral arteries are patent bilaterally without significant stenosis.  Negative for cerebral aneurysm.  MRA NECK FINDINGS  Three vessel aortic arch.  There is limited signal in the proximal left the common carotid artery. This is most  compatible with artifact rather than atherosclerosis. The left carotid bifurcation is widely patent without significant stenosis.  Poor signal in the proximal right common carotid artery. This is most likely due to tortuosity and artifact however proximal right common carotid artery stenosis cannot be excluded. 50% diameter stenosis proximal right external carotid artery. Internal carotid artery widely patent.  Both vertebral arteries are patent to the basilar without significant stenosis.  IMPRESSION: No acute intracranial abnormality. No significant intracranial abnormality is detected.  Negative MRA of the head  MRA of the neck reveals poor signal in the proximal common carotid arteries bilaterally likely related to artifact however atherosclerotic disease not excluded. However, the carotid bifurcation is widely patent bilaterally suggesting the patient does not have significant atherosclerotic disease elsewhere.   Electronically Signed   By: Marlan Palau M.D.   On: 05/04/2013 10:30   Mr Laqueta Jean ZO Contrast  05/04/2013   CLINICAL DATA:  Stroke right-sided pain and numbness. . Abnormal CT.  EXAM: MRI HEAD WITHOUT AND WITH CONTRAST  MRA HEAD WITHOUT CONTRAST  MRA  NECK WITHOUT AND WITH CONTRAST  TECHNIQUE: Multiplanar, multiecho pulse sequences of the brain and surrounding structures were obtained without and with intravenous contrast. Angiographic images of the Circle of Willis were obtained using MRA technique without intravenous contrast. Angiographic images of the neck were obtained using MRA technique without and with intravenous contrast. Carotid stenosis measurements (when applicable) are obtained utilizing NASCET criteria, using the distal internal carotid diameter as the denominator.  CONTRAST:  20mL MULTIHANCE GADOBENATE DIMEGLUMINE 529 MG/ML IV SOLN  COMPARISON:  CT head 05/03/2013  FINDINGS: MRI HEAD FINDINGS  Negative for acute infarct. Hypodensity right cerebellum on CT apparently was on artifact. No acute or chronic infarct or mass is seen in this area.  No significant chronic ischemia. Negative for demyelinating disease. Cerebral white matter is normal. Brainstem and cerebellum are normal.  Negative for intracranial hemorrhage.  Negative for mass or edema.  Postcontrast imaging reveals normal enhancement. There is mild motion on the postcontrast images.  MRA HEAD FINDINGS  MRA image quality degraded by motion.  Both vertebral arteries are patent to the basilar. Posterior inferior cerebellar artery is patent bilaterally. The basilar is widely patent. Superior cerebellar and posterior cerebral arteries are patent bilaterally without significant stenosis.  Internal carotid artery is widely patent without stenosis. Anterior and middle cerebral arteries are patent bilaterally without significant stenosis.  Negative for cerebral aneurysm.  MRA NECK FINDINGS  Three vessel aortic arch.  There is limited signal in the proximal left the common carotid artery. This is most compatible with artifact rather than atherosclerosis. The left carotid bifurcation is widely patent without significant stenosis.  Poor signal in the proximal right common carotid artery. This is most  likely due to tortuosity and artifact however proximal right common carotid artery stenosis cannot be excluded. 50% diameter stenosis proximal right external carotid artery. Internal carotid artery widely patent.  Both vertebral arteries are patent to the basilar without significant stenosis.  IMPRESSION: No acute intracranial abnormality. No significant intracranial abnormality is detected.  Negative MRA of the head  MRA of the neck reveals poor signal in the proximal common carotid arteries bilaterally likely related to artifact however atherosclerotic disease not excluded. However, the carotid bifurcation is widely patent bilaterally suggesting the patient does not have significant atherosclerotic disease elsewhere.   Electronically Signed   By: Marlan Palau M.D.   On: 05/04/2013 10:30    Microbiology: No results found for this or  any previous visit (from the past 240 hour(s)).   Labs: Basic Metabolic Panel:  Recent Labs Lab 05/03/13 1820  NA 138  K 4.0  CL 100  CO2 31  GLUCOSE 129*  BUN 15  CREATININE 0.93  CALCIUM 9.4   Liver Function Tests:  Recent Labs Lab 05/03/13 1820  AST 17  ALT 13  ALKPHOS 89  BILITOT 0.7  PROT 6.5  ALBUMIN 3.3*   No results found for this basename: LIPASE, AMYLASE,  in the last 168 hours No results found for this basename: AMMONIA,  in the last 168 hours CBC:  Recent Labs Lab 05/03/13 1820 05/04/13 1305  WBC 14.8* 14.0*  NEUTROABS 12.1*  --   HGB 14.7 15.0  HCT 44.5 44.6  MCV 79.6 80.2  PLT 146* 143*   Cardiac Enzymes:  Recent Labs Lab 05/03/13 1820  TROPONINI <0.30   BNP: BNP (last 3 results) No results found for this basename: PROBNP,  in the last 8760 hours CBG:  Recent Labs Lab 05/03/13 1820  GLUCAP 124*       Signed:  Keyaria Lawson K  [facility] 05/05/2013, 4:41 PM

## 2013-05-05 NOTE — Progress Notes (Signed)
Stroke Team Progress Note  HISTORY [name] [name] is a 72 y.o. male with a history of CHF, hyperlipidemia, peptic ulcer disease who presented with right-sided numbness following pain to the right side of his neck in the posterior aspect which started about a week ago. He stated that the symptoms came on suddenly at approximately the same time, went to his primary care physician today who performed a CT which showed some hypodensity in the right cerebellum concerning for stroke.   LKW: One week ago  tpa given?: no, outside of window   SUBJECTIVE There are no family members present this morning. The patient reports that he had a severe headache around 6 AM which radiated down the right posterior neck and into his right arm. He states the pain was quite significant but resolved after the nurse gave him pain medication. He has oxycodone ordered when necessary. He still has residual weakness and decreased sensation on the right.  OBJECTIVE Most recent Vital Signs: Filed Vitals:   05/04/13 1316 05/04/13 1826 05/04/13 2055 05/05/13 0035  BP: 118/67 129/58 134/84 153/88  Pulse: 81 79 77 88  Temp: 97.5 F (36.4 C) 97.8 F (36.6 C) 98.1 F (36.7 C) 97.8 F (36.6 C)  TempSrc: Oral Oral Oral Oral  Resp: 18 18 18 20   Height:      Weight:      SpO2: 94% 94% 94% 97%   CBG (last 3)   Recent Labs  05/03/13 1820  GLUCAP 124*    IV Fluid Intake:     MEDICATIONS  . atorvastatin  80 mg Oral QHS  . clopidogrel  75 mg Oral Q breakfast  . docusate sodium  100 mg Oral QHS  . dorzolamide-timolol  1 drop Both Eyes BID  . furosemide  80 mg Oral BID  . gabapentin  300 mg Oral TID  . heparin  5,000 Units Subcutaneous Q8H  . latanoprost  1 drop Both Eyes QHS  . loratadine  10 mg Oral Daily  . metoprolol succinate  12.5 mg Oral q morning - 10a  . pantoprazole  40 mg Oral BID  . potassium chloride SA  20 mEq Oral BID  . tamsulosin  0.4 mg Oral QPC supper   PRN:  acetaminophen, cyclobenzaprine,  oxyCODONE, oxyCODONE-acetaminophen, polyvinyl alcohol  Diet:  Cardiac thin liquids Activity:  Bedrest DVT Prophylaxis:  Subcutaneous heparin  CLINICALLY SIGNIFICANT STUDIES Basic Metabolic Panel:   Recent Labs Lab 05/03/13 1820  NA 138  K 4.0  CL 100  CO2 31  GLUCOSE 129*  BUN 15  CREATININE 0.93  CALCIUM 9.4   Liver Function Tests:   Recent Labs Lab 05/03/13 1820  AST 17  ALT 13  ALKPHOS 89  BILITOT 0.7  PROT 6.5  ALBUMIN 3.3*   CBC:   Recent Labs Lab 05/03/13 1820 05/04/13 1305  WBC 14.8* 14.0*  NEUTROABS 12.1*  --   HGB 14.7 15.0  HCT 44.5 44.6  MCV 79.6 80.2  PLT 146* 143*   Coagulation:   Recent Labs Lab 05/03/13 1820  LABPROT 12.7  INR 0.97   Cardiac Enzymes:   Recent Labs Lab 05/03/13 1820  TROPONINI <0.30   Urinalysis: No results found for this basename: COLORURINE, APPERANCEUR, LABSPEC, PHURINE, GLUCOSEU, HGBUR, BILIRUBINUR, KETONESUR, PROTEINUR, UROBILINOGEN, NITRITE, LEUKOCYTESUR,  in the last 168 hours Lipid Panel    Component Value Date/Time   CHOL 115 05/04/2013 0430   TRIG 131 05/04/2013 0430   HDL 39* 05/04/2013 0430   CHOLHDL 2.9 05/04/2013  0430   VLDL 26 05/04/2013 0430   LDLCALC 50 05/04/2013 0430   HgbA1C  Lab Results  Component Value Date   HGBA1C 5.3 05/04/2013    Urine Drug Screen:   No results found for this basename: labopia,  cocainscrnur,  labbenz,  amphetmu,  thcu,  labbarb    Alcohol Level: No results found for this basename: ETH,  in the last 168 hours  Ct Head Wo Contrast 05/03/2013    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 Study otherwise unremarkable.      MRI of the brain 05/04/2013  Negative for acute infarct. Hypodensity right cerebellum on CT  apparently was on artifact. No acute or chronic infarct or mass is  seen in this area.   MRA of the head 05/04/2013 Negative MRA of the head   MRA of neck 05/04/2013 Reveals poor signal in the  proximal common carotid  arteries bilaterally likely related to artifact however  atherosclerotic disease not excluded. However, the carotid  bifurcation is widely patent bilaterally suggesting the patient does  not have significant atherosclerotic disease elsewhere.     2D Echocardiogram  ejection fraction 65-70%. No cardiac source of emboli was identified  Carotid Doppler see MRA of the neck  CXR    EKG sinus rhythm rate 95 beats per minute  Therapy Recommendations pending  Physical exam  Neurologic Exam    Mental Status:  Patient is awake, alert, oriented to person, place, month, year, and situation.  Immediate and remote memory are intact.  Patient is able to give a clear and coherent history.  No signs of aphasia or neglect  Cranial Nerves:  II: Visual Fields are full. Pupils are equal, round, and reactive to light. Discs are difficult to visualize.  III,IV, VI: EOMI without ptosis or diploplia.  V: Facial sensation is decreased on the right  VII: Facial movement is symmetric.  VIII: hearing is intact to voice  X: Uvula elevates symmetrically  XI: Shoulder shrug is symmetric.  XII: tongue is midline without atrophy or fasciculations.  Motor:  Tone is normal. Bulk is normal. BUE and LLE 5/5. RLE 3/5. Right upper extremity has decreased grip strength as well as a drift. Sensory:  Sensation is decreased throughout the right  Deep Tendon Reflexes:  2+ and symmetric in the biceps and patellae.  Cerebellar:  FNF intact. Gait:  Not assessed.    ASSESSMENT [name] is a 72 y.o. male presenting with right hemisensory deficits and right hemiparesis.[name] [name] No TPA secondary to late presentation. A CT scan of the head suspicious for a recent infarct in the posterior mid right cerebellum with localized cytotoxic edema, but MRI negative for acute infarct. On no antithrombotics prior to admission. Now on Plavix 75 mg daily ( aspirin allergy ) for secondary stroke  prevention. Patient with resultant right hemiparesis. Work up underway.   Leukocytosis  Hyperlipidemia - Lipitor prior to admission  Hypertension history  CHF history  Thrombocytopenia history  Suspected complicated migraine.  Hospital day # 2  TREATMENT/PLAN  Continue clopidogrel 75 mg orally every day for secondary stroke prevention.  MRI/MRA negative for acute stroke.  Await therapy evaluations  Monitor platelets and WBCs. Checked yesterday and showed no significant change.  Possible discharge today - question medication for migraines other than opiates.  Delton See PA-C [facility] Pager [phone] 05/05/2013, 8:42 AM  I evaluated and examined patient, reviewed records, labs and imaging, and agree with note and  plan. Symptoms suspicious for complicated migraine vs. TIA: (2 weeks ago, felt lightning stroke in right neck, eye, head, saw swirling colors on the ground, associated with HA, nausea and right sided numbness and weakness. Symptoms fluctuating since then. MRI negative for stroke).   Suanne Marker, MD 05/05/2013, 4:53 PM Certified in Neurology, Neurophysiology and Neuroimaging [facility] - Stroke Team  Please refer to [email or web address] for on-call Stroke MD

## 2013-05-05 NOTE — Evaluation (Signed)
Physical Therapy Evaluation Patient Details Name: [name] [name] MRN: [id number] DOB: 02-23-1941 Today's Date: 05/05/2013 Time: 3086-5784 PT Time Calculation (min): 27 min  PT Assessment / Plan / Recommendation History of Present Illness  [name] [name] is a 72 y.o. male with a history of CHF, hyperlipidemia, peptic ulcer disease who presented with right-sided numbness following pain to the right side of his neck in the posterior aspect which started about a week ago. He stated that the symptoms came on suddenly at approximately the same time, went to his primary care physician today who performed a CT which showed some hypodensity in the right cerebellum concerning for stroke.   Clinical Impression  Pt admitted with above. Pt currently with functional limitations due to the deficits listed below (see PT Problem List).  Pt will benefit from skilled PT to increase their independence and safety with mobility to allow discharge to the venue listed below.       PT Assessment  Patient needs continued PT services    Follow Up Recommendations  Home health PT;Supervision/Assistance - 24 hour    Does the patient have the potential to tolerate intense rehabilitation      Barriers to Discharge        Equipment Recommendations  Rolling walker with 5" wheels;3in1 (PT)    Recommendations for Other Services OT consult   Frequency Min 4X/week    Precautions / Restrictions     Pertinent Vitals/Pain no apparent distress       Mobility  Bed Mobility Bed Mobility: Not assessed (pt up in chair) Transfers Sit to Stand: 4: Min guard;From chair/3-in-1;From toilet;With upper extremity assist Stand to Sit: 4: Min guard;To chair/3-in-1;To toilet;With upper extremity assist Details for Transfer Assistance: Min guard for safety only. No physical assist needed. Ambulation/Gait Ambulation/Gait Assistance: 4: Min guard Ambulation Distance (Feet): 80 Feet Assistive device: Rolling  walker;None Ambulation/Gait Assistance Details: Overall moving well, without episodes of overt R knee weakness; Trialed walking with and without RW, and pt is much more efficient with gait with RW Gait Pattern: Step-through pattern Modified Rankin (Stroke Patients Only) Pre-Morbid Rankin Score: No symptoms Modified Rankin: Slight disability    Exercises     PT Diagnosis: Difficulty walking  PT Problem List: Decreased strength;Decreased activity tolerance;Decreased balance;Decreased mobility;Decreased coordination;Decreased knowledge of use of DME PT Treatment Interventions: DME instruction;Gait training;Stair training;Functional mobility training;Therapeutic activities;Therapeutic exercise;Patient/family education     PT Goals(Current goals can be found in the care plan section) Acute Rehab PT Goals Patient Stated Goal: to return home PT Goal Formulation: With patient Time For Goal Achievement: 05/12/13 Potential to Achieve Goals: Good  Visit Information  Last PT Received On: 05/05/13 Assistance Needed: +1 History of Present Illness: [name] [name] is a 72 y.o. male with a history of CHF, hyperlipidemia, peptic ulcer disease who presented with right-sided numbness following pain to the right side of his neck in the posterior aspect which started about a week ago. He stated that the symptoms came on suddenly at approximately the same time, went to his primary care physician today who performed a CT which showed some hypodensity in the right cerebellum concerning for stroke.        Prior Functioning  Home Living Family/patient expects to be discharged to:: Private residence Living Arrangements: Spouse/significant other Available Help at Discharge: Family Type of Home: House Home Access: Stairs to enter Secretary/administrator of Steps: 3 Entrance Stairs-Rails: None Home Layout: One level Home Equipment: Bedside commode;Shower seat;Adaptive equipment Adaptive Equipment:  Reacher Additional Comments:  Wife unable to assist- she is under hospice care.  Pt states his children provide near 24/7 assist. pt sleeps in his recliner chair and has for 3 years, oxygen PRN at home- he reports usually 2-3 nights per week Prior Function Level of Independence: Needs assistance ADL's / Homemaking Assistance Needed: His sons help him with tub transfers.  Otherwise he is independent with his ADLs.    Communication Communication: No difficulties Dominant Hand: Right    Cognition  Cognition Arousal/Alertness: Awake/alert Behavior During Therapy: WFL for tasks assessed/performed Overall Cognitive Status: Within Functional Limits for tasks assessed    Extremity/Trunk Assessment Upper Extremity Assessment RUE Deficits / Details: 3+/5 shoulder extension and 4/5 throughout rest of UE. RUE Sensation: decreased light touch;decreased proprioception RUE Coordination: decreased fine motor;decreased gross motor Lower Extremity Assessment Lower Extremity Assessment: RLE deficits/detail RLE Deficits / Details: Grossly 4/5 throughout   Balance    End of Session PT - End of Session Equipment Utilized During Treatment: Gait belt Activity Tolerance: Patient tolerated treatment well Patient left: in chair;with call bell/phone within reach Nurse Communication: Mobility status  GP     [name] [name], [name] [phone]  05/05/2013, 5:15 PM

## 2013-05-08 DIAGNOSIS — E039 Hypothyroidism, unspecified: Secondary | ICD-10-CM | POA: Diagnosis not present

## 2013-05-08 DIAGNOSIS — Z6841 Body Mass Index (BMI) 40.0 and over, adult: Secondary | ICD-10-CM | POA: Diagnosis not present

## 2013-05-08 DIAGNOSIS — I10 Essential (primary) hypertension: Secondary | ICD-10-CM | POA: Diagnosis not present

## 2013-05-08 DIAGNOSIS — G43909 Migraine, unspecified, not intractable, without status migrainosus: Secondary | ICD-10-CM | POA: Diagnosis not present

## 2013-05-14 ENCOUNTER — Ambulatory Visit (INDEPENDENT_AMBULATORY_CARE_PROVIDER_SITE_OTHER): Payer: Medicare Other | Admitting: Diagnostic Neuroimaging

## 2013-05-14 ENCOUNTER — Encounter: Payer: Self-pay | Admitting: Diagnostic Neuroimaging

## 2013-05-14 VITALS — BP 122/72 | HR 87 | Temp 97.9°F | Ht 69.0 in | Wt 288.0 lb

## 2013-05-14 DIAGNOSIS — G43109 Migraine with aura, not intractable, without status migrainosus: Secondary | ICD-10-CM

## 2013-05-14 MED ORDER — VERAPAMIL HCL ER 120 MG PO TBCR: 120.0000 mg | EXTENDED_RELEASE_TABLET | Freq: Every day | ORAL | Status: DC

## 2013-05-14 NOTE — Progress Notes (Signed)
[facility]  PATIENT: [name] [name] DOB: Jul 09, 1940  REFERRING CLINICIAN:  HISTORY FROM: patient and son REASON FOR VISIT: follow up   HISTORICAL  CHIEF COMPLAINT:  Chief Complaint  Patient presents with  . Migraine    HISTORY OF PRESENT ILLNESS:   72 year old male with headache and blurred vision. I saw patient in the hospital recently when he was evaluated for possible stroke. Patient describes sudden onset pain in his right neck, right face, right eye, felt like lightning coming out and then he saw swirling colors on the ground in front of them. He then also developed right face, arm, leg weakness. Patient has been discharged the hospital and is doing well. He is continuing to have some mild headache. He was discharged and foraminal 80 mg daily which has helped him.  REVIEW OF SYSTEMS: Full 14 system review of systems performed and notable only for snoring memory loss headache anxiety not asleep his interest in activities allergies or pain joint swelling feeling cold increased her she's bruising blurred vision eye pain shortness of breath snoring swelling and legs fatigue fevers chills hearing loss ringing in ears impotence.  ALLERGIES: Allergies  Allergen Reactions  . Aspirin Other (See Comments)    Nausea and upset stomach      HOME MEDICATIONS: Outpatient Prescriptions Prior to Visit  Medication Sig Dispense Refill  . acetaminophen (TYLENOL) 325 MG tablet Take 650 mg by mouth every 6 (six) hours as needed for pain.      Marland Kitchen atorvastatin (LIPITOR) 80 MG tablet Take 80 mg by mouth at bedtime.      . cetirizine (ZYRTEC) 10 MG tablet Take 10 mg by mouth daily.      . clopidogrel (PLAVIX) 75 MG tablet Take 1 tablet (75 mg total) by mouth daily with breakfast.  30 tablet  0  . cyclobenzaprine (FLEXERIL) 10 MG tablet Take 1 tablet (10 mg total) by mouth 3 (three) times daily as needed for muscle spasms.  45 tablet  0  . docusate sodium (COLACE) 100 MG  capsule Take 100 mg by mouth at bedtime.      . Dorzolamide HCl-Timolol Mal PF 22.3-6.8 MG/ML SOLN Apply 1 drop to eye 2 (two) times daily. Dorzolamide HCI-Timolol Maleate Ophthalmic Solution 2.23%/0.68%      . furosemide (LASIX) 80 MG tablet Take 80 mg by mouth 2 (two) times daily.       Marland Kitchen latanoprost (XALATAN) 0.005 % ophthalmic solution Place 1 drop into both eyes at bedtime.      . metoprolol succinate (TOPROL-XL) 25 MG 24 hr tablet Take 0.5 tablets (12.5 mg total) by mouth every morning.  30 tablet  11  . ondansetron (ZOFRAN) 4 MG tablet TAKE ONE TABLET BY MOUTH EVERY 8 HOURS AS NEEDED FOR NAUSEA  30 tablet  0  . oxyCODONE-acetaminophen (PERCOCET) 10-325 MG per tablet Take 1 tablet by mouth every 6 (six) hours as needed for pain.  70 tablet  0  . pantoprazole (PROTONIX) 40 MG tablet Take 40 mg by mouth 2 (two) times daily.      [name] (LUBRICANT EYE DROPS) 0.4-0.3 % SOLN Apply 1 drop to eye daily as needed (for lubricant eye drops).      . potassium chloride SA (K-DUR,KLOR-CON) 20 MEQ tablet Take 1 tablet (20 mEq total) by mouth 2 (two) times daily.  60 tablet  0  . Tamsulosin HCl (FLOMAX) 0.4 MG CAPS Take 0.4 mg by mouth daily after supper.       Marland Kitchen  Trolamine Salicylate (ASPERCREME EX) Apply 1 application topically every 6 (six) hours as needed (pain).      . verapamil (CALAN) 80 MG tablet Take 1 tablet (80 mg total) by mouth daily.  30 tablet  0   No facility-administered medications prior to visit.    PAST MEDICAL HISTORY: Past Medical History  Diagnosis Date  . CHF (congestive heart failure)   . Obesity   . Hyperlipidemia   . PUD (peptic ulcer disease)   . GERD (gastroesophageal reflux disease)   . S/P endoscopy 2007, 2012    2007: nl, May 2012: antral erosions  . Osteoarthritis   . S/P colonoscopy 2007, Feb and March 2012    hx of adenomas, left-sided diverticula, On 07/2010 TCS, fresh blood and clot noted coming from TI.  Marland Kitchen Anxiety   . Thyroid disease      hypothyroid  . Ringing in ears   . History of kidney stones   . History of bladder infections   . Heel spur     right heel  . Collagen vascular disease     history of venous insufficency and LE cellulitis  . Thrombocytopenia 12/30/2011    Stable  . BPH (benign prostatic hyperplasia)   . Sleep apnea     uses oxygen cannot tolerate CPAP  . Hypertension     Sees Dr. Rennis Golden  . Bilateral lower leg cellulitis     PAST SURGICAL HISTORY: Past Surgical History  Procedure Laterality Date  . Knee arthroscopy    . Rotator cuff repair    . Cholecystectomy    . Lumbar spine surgery    . Small bowel capsule study  07/2011    ?transient focal ischemia in distal small bowel to explain GI bleeding  . Esophagogastroduodenoscopy  Aug 2013    [facility], single 5mm pedunculated polyp otherwise normal. HYPERPLASTIC, NEGATIVE h.pylori  . Cardiac catheterization      02/27/07: no sign CAD, LVH, mod pulm HTN    FAMILY HISTORY: Family History  Problem Relation Age of Onset  . Colon cancer Father 75    deceased  . Prostate cancer Father   . Pancreatic cancer Mother 63    deceased    SOCIAL HISTORY:  History   Social History  . Marital Status: Married    Spouse Name: LaRuth    Number of Children: 5  . Years of Education: College   Occupational History  . disability    Social History Main Topics  . Smoking status: Former Smoker -- 8 years    Types: Cigars    Quit date: 08/10/1973  . Smokeless tobacco: Former Neurosurgeon    Types: Chew    Quit date: 08/10/1973     Comment: Pt reports intermittent smoking  . Alcohol Use: No  . Drug Use: No  . Sexual Activity: No   Other Topics Concern  . Not on file   Social History Narrative   Patient lives at home with spouse and son.   Caffeine Use: 2 cups weekly     PHYSICAL EXAM  Filed Vitals:   05/14/13 1001  BP: 122/72  Pulse: 87  Temp: 97.9 F (36.6 C)  TempSrc: Oral  Height: 5\' 9"  (1.753 m)  Weight: 288 lb (130.636 kg)    Not  recorded    Body mass index is 42.51 kg/(m^2).  GENERAL EXAM: Patient is in no distress; well developed, nourished and groomed; neck is supple  CARDIOVASCULAR: Regular rate and rhythm, no murmurs, no carotid bruits  NEUROLOGIC: MENTAL STATUS: awake, alert, oriented to person, place and time, recent and remote memory intact, normal attention and concentration, language fluent, comprehension intact, naming intact, fund of knowledge appropriate CRANIAL NERVE: no papilledema on fundoscopic exam, pupils equal and reactive to light, visual fields full to confrontation, extraocular muscles intact, no nystagmus, facial sensation and strength symmetric, hearing intact, palate elevates symmetrically, uvula midline, shoulder shrug symmetric, tongue midline. MOTOR: normal bulk and tone, full strength in the BUE, LLE; RLE (HF 3, KE 3, KF 3, DF 4).  SENSORY: normal and symmetric to light touch COORDINATION: finger-nose-finger NORMAL. REFLEXES: deep tendon reflexes TRACE and symmetric GAIT/STATION: narrow based gait; ANTALGIC, LIMPING ON RIGHT LEG. UNSTEADY.    DIAGNOSTIC DATA (LABS, IMAGING, TESTING) - I reviewed patient records, labs, notes, testing and imaging myself where available.  Lab Results  Component Value Date   WBC 14.0* 05/04/2013   HGB 15.0 05/04/2013   HCT 44.6 05/04/2013   MCV 80.2 05/04/2013   PLT 143* 05/04/2013      Component Value Date/Time   NA 138 05/03/2013 1820   K 4.0 05/03/2013 1820   CL 100 05/03/2013 1820   CO2 31 05/03/2013 1820   GLUCOSE 129* 05/03/2013 1820   BUN 15 05/03/2013 1820   CREATININE 0.93 05/03/2013 1820   CREATININE 0.88 10/27/2011 1141   CALCIUM 9.4 05/03/2013 1820   PROT 6.5 05/03/2013 1820   ALBUMIN 3.3* 05/03/2013 1820   AST 17 05/03/2013 1820   ALT 13 05/03/2013 1820   ALKPHOS 89 05/03/2013 1820   BILITOT 0.7 05/03/2013 1820   GFRNONAA 81* 05/03/2013 1820   GFRAA >90 05/03/2013 1820   Lab Results  Component Value Date   CHOL 115  05/04/2013   HDL 39* 05/04/2013   LDLCALC 50 05/04/2013   TRIG 131 05/04/2013   CHOLHDL 2.9 05/04/2013   Lab Results  Component Value Date   HGBA1C 5.3 05/04/2013   No results found for this basename: [id number]   Lab Results  Component Value Date   TSH 0.986 02/27/2012    05/04/13 MRI brain - Negative for acute infarct. Hypodensity right cerebellum on CT  apparently was on artifact. No acute or chronic infarct or mass is  seen in this area.   05/04/13 MRA head - no stenosis  05/04/13 MRA neck - poor signal in the proximal common carotid  arteries bilaterally likely related to artifact however  atherosclerotic disease not excluded. However, the carotid  bifurcation is widely patent bilaterally suggesting the patient does  not have significant atherosclerotic disease elsewhere.   ASSESSMENT AND PLAN  72 y.o. year old male here with suspected complicated migraine, and history of lumbar radiculopathy and spinal stenosis, s/p surgery.  PLAN: - increase verapamil to ER 120mg  daily   Meds ordered this encounter  Medications  . verapamil (CALAN-SR) 120 MG CR tablet    Sig: Take 1 tablet (120 mg total) by mouth at bedtime.    Dispense:  30 tablet    Refill:  12    Return in about 6 months (around 11/12/2013) for with Heide Guile or Penumalli.    Suanne Marker, MD 05/14/2013, 10:46 AM Certified in Neurology, Neurophysiology and Neuroimaging  [facility] [address], [address] [place], [place] [address] [phone]

## 2013-05-14 NOTE — Patient Instructions (Addendum)
Change verapamil to extended release, 120mg  at bedtime. Monitor blood pressures at home.

## 2013-05-23 HISTORY — PX: POSTERIOR LUMBAR FUSION: SHX6036

## 2013-05-25 ENCOUNTER — Emergency Department (HOSPITAL_COMMUNITY): Payer: Medicare Other

## 2013-05-25 ENCOUNTER — Encounter (HOSPITAL_COMMUNITY): Payer: Self-pay | Admitting: Emergency Medicine

## 2013-05-25 ENCOUNTER — Emergency Department (HOSPITAL_COMMUNITY)
Admission: EM | Admit: 2013-05-25 | Discharge: 2013-05-25 | Disposition: A | Discharge status: Home / Self Care | Payer: Medicare Other | Attending: Emergency Medicine | Admitting: Emergency Medicine

## 2013-05-25 DIAGNOSIS — G4730 Sleep apnea, unspecified: Secondary | ICD-10-CM | POA: Diagnosis not present

## 2013-05-25 DIAGNOSIS — Z9889 Other specified postprocedural states: Secondary | ICD-10-CM | POA: Diagnosis not present

## 2013-05-25 DIAGNOSIS — R109 Unspecified abdominal pain: Secondary | ICD-10-CM | POA: Diagnosis not present

## 2013-05-25 DIAGNOSIS — R32 Unspecified urinary incontinence: Secondary | ICD-10-CM | POA: Diagnosis not present

## 2013-05-25 DIAGNOSIS — Z872 Personal history of diseases of the skin and subcutaneous tissue: Secondary | ICD-10-CM | POA: Insufficient documentation

## 2013-05-25 DIAGNOSIS — Z79899 Other long term (current) drug therapy: Secondary | ICD-10-CM | POA: Insufficient documentation

## 2013-05-25 DIAGNOSIS — Z8739 Personal history of other diseases of the musculoskeletal system and connective tissue: Secondary | ICD-10-CM | POA: Diagnosis not present

## 2013-05-25 DIAGNOSIS — I509 Heart failure, unspecified: Secondary | ICD-10-CM | POA: Insufficient documentation

## 2013-05-25 DIAGNOSIS — I10 Essential (primary) hypertension: Secondary | ICD-10-CM | POA: Insufficient documentation

## 2013-05-25 DIAGNOSIS — Z87442 Personal history of urinary calculi: Secondary | ICD-10-CM | POA: Diagnosis not present

## 2013-05-25 DIAGNOSIS — Z8711 Personal history of peptic ulcer disease: Secondary | ICD-10-CM | POA: Insufficient documentation

## 2013-05-25 DIAGNOSIS — F411 Generalized anxiety disorder: Secondary | ICD-10-CM | POA: Diagnosis not present

## 2013-05-25 DIAGNOSIS — R0602 Shortness of breath: Secondary | ICD-10-CM | POA: Insufficient documentation

## 2013-05-25 DIAGNOSIS — E669 Obesity, unspecified: Secondary | ICD-10-CM | POA: Insufficient documentation

## 2013-05-25 DIAGNOSIS — G43109 Migraine with aura, not intractable, without status migrainosus: Secondary | ICD-10-CM | POA: Diagnosis not present

## 2013-05-25 DIAGNOSIS — Z862 Personal history of diseases of the blood and blood-forming organs and certain disorders involving the immune mechanism: Secondary | ICD-10-CM | POA: Diagnosis not present

## 2013-05-25 DIAGNOSIS — M791 Myalgia, unspecified site: Secondary | ICD-10-CM

## 2013-05-25 DIAGNOSIS — Z9981 Dependence on supplemental oxygen: Secondary | ICD-10-CM | POA: Insufficient documentation

## 2013-05-25 DIAGNOSIS — Z87891 Personal history of nicotine dependence: Secondary | ICD-10-CM | POA: Diagnosis not present

## 2013-05-25 DIAGNOSIS — M1990 Unspecified osteoarthritis, unspecified site: Secondary | ICD-10-CM | POA: Insufficient documentation

## 2013-05-25 DIAGNOSIS — N400 Enlarged prostate without lower urinary tract symptoms: Secondary | ICD-10-CM | POA: Diagnosis not present

## 2013-05-25 DIAGNOSIS — Z7902 Long term (current) use of antithrombotics/antiplatelets: Secondary | ICD-10-CM | POA: Insufficient documentation

## 2013-05-25 DIAGNOSIS — IMO0001 Reserved for inherently not codable concepts without codable children: Secondary | ICD-10-CM | POA: Diagnosis not present

## 2013-05-25 DIAGNOSIS — K219 Gastro-esophageal reflux disease without esophagitis: Secondary | ICD-10-CM | POA: Insufficient documentation

## 2013-05-25 DIAGNOSIS — E785 Hyperlipidemia, unspecified: Secondary | ICD-10-CM | POA: Diagnosis not present

## 2013-05-25 DIAGNOSIS — Z87440 Personal history of urinary (tract) infections: Secondary | ICD-10-CM | POA: Diagnosis not present

## 2013-05-25 DIAGNOSIS — R609 Edema, unspecified: Secondary | ICD-10-CM | POA: Insufficient documentation

## 2013-05-25 LAB — CBC
HCT: 43.4 % (ref 39.0–52.0)
Hemoglobin: 14 g/dL (ref 13.0–17.0)
MCH: 26.1 pg (ref 26.0–34.0)
MCHC: 32.3 g/dL (ref 30.0–36.0)
MCV: 81 fL (ref 78.0–100.0)
Platelets: 117 10*3/uL — ABNORMAL LOW (ref 150–400)
RBC: 5.36 MIL/uL (ref 4.22–5.81)
RDW: 17.8 % — ABNORMAL HIGH (ref 11.5–15.5)
WBC: 13.5 10*3/uL — ABNORMAL HIGH (ref 4.0–10.5)

## 2013-05-25 LAB — URINALYSIS, ROUTINE W REFLEX MICROSCOPIC
Bilirubin Urine: NEGATIVE
Glucose, UA: NEGATIVE mg/dL
Hgb urine dipstick: NEGATIVE
KETONES UR: NEGATIVE mg/dL
Leukocytes, UA: NEGATIVE
NITRITE: NEGATIVE
PH: 7.5 (ref 5.0–8.0)
Protein, ur: NEGATIVE mg/dL
Specific Gravity, Urine: 1.015 (ref 1.005–1.030)
Urobilinogen, UA: 1 mg/dL (ref 0.0–1.0)

## 2013-05-25 LAB — BASIC METABOLIC PANEL
BUN: 14 mg/dL (ref 6–23)
CO2: 32 mEq/L (ref 19–32)
Calcium: 9.3 mg/dL (ref 8.4–10.5)
Chloride: 102 mEq/L (ref 96–112)
Creatinine, Ser: 0.88 mg/dL (ref 0.50–1.35)
GFR, EST NON AFRICAN AMERICAN: 83 mL/min — AB (ref >90)
Glucose, Bld: 94 mg/dL (ref 70–99)
Potassium: 4.5 mEq/L (ref 3.7–5.3)
Sodium: 141 mEq/L (ref 137–147)

## 2013-05-25 LAB — PRO B NATRIURETIC PEPTIDE: PRO B NATRI PEPTIDE: 26.1 pg/mL (ref 0–125)

## 2013-05-25 LAB — TROPONIN I

## 2013-05-25 MED ORDER — OXYCODONE-ACETAMINOPHEN 5-325 MG PO TABS
1.0000 | ORAL_TABLET | Freq: Once | ORAL | Status: AC
  Administered 2013-05-25: 1 via ORAL
  Filled 2013-05-25: qty 1

## 2013-05-25 NOTE — Discharge Instructions (Signed)
Myalgia, Adult Myalgia is the medical term for muscle pain. It is a symptom of many things. Nearly everyone at some time in their life has this. The most common cause for muscle pain is overuse or straining and more so when you are not in shape. Injuries and muscle bruises cause myalgias. Muscle pain without a history of injury can also be caused by a virus. It frequently comes along with the flu. Myalgia not caused by muscle strain can be present in a large number of infectious diseases. Some autoimmune diseases like lupus and fibromyalgia can cause muscle pain. Myalgia may be mild, or severe. SYMPTOMS  The symptoms of myalgia are simply muscle pain. Most of the time this is short lived and the pain goes away without treatment. DIAGNOSIS  Myalgia is diagnosed by your caregiver by taking your history. This means you tell him when the problems began, what they are, and what has been happening. If this has not been a long term problem, your caregiver may want to watch for a while to see what will happen. If it has been long term, they may want to do additional testing. TREATMENT  The treatment depends on what the underlying cause of the muscle pain is. Often anti-inflammatory medications will help. HOME CARE INSTRUCTIONS  If the pain in your muscles came from overuse, slow down your activities until the problems go away.  Myalgia from overuse of a muscle can be treated with alternating hot and cold packs on the muscle affected or with cold for the first couple days. If either heat or cold seems to make things worse, stop their use.  Apply ice to the sore area for 15-20 minutes, 03-04 times per day, while awake for the first 2 days of muscle soreness, or as directed. Put the ice in a plastic bag and place a towel between the bag of ice and your skin.  Only take over-the-counter or prescription medicines for pain, discomfort, or fever as directed by your caregiver.  Regular gentle exercise may help if  you are not active.  Stretching before strenuous exercise can help lower the risk of myalgia. It is normal when beginning an exercise regimen to feel some muscle pain after exercising. Muscles that have not been used frequently will be sore at first. If the pain is extreme, this may mean injury to a muscle. SEEK MEDICAL CARE IF:  You have an increase in muscle pain that is not relieved with medication.  You begin to run a temperature.  You develop nausea and vomiting.  You develop a stiff and painful neck.  You develop a rash.  You develop muscle pain after a tick bite.  You have continued muscle pain while working out even after you are in good condition. SEEK IMMEDIATE MEDICAL CARE IF: Any of your problems are getting worse and medications are not helping. MAKE SURE YOU:   Understand these instructions.  Will watch your condition.  Will get help right away if you are not doing well or get worse. Document Released: 03/31/2006 Document Revised: 08/01/2011 Document Reviewed: 06/20/2006 [facility] Patient Information 2014 [place], [place].  Urinary Incontinence Urinary incontinence is the involuntary loss of urine from your bladder. CAUSES  There are many causes of urinary incontinence. They include:  Medicines.  Infections.  Prostatic enlargement, leading to overflow of urine from your bladder.  Surgery.  Neurological diseases.  Emotional factors. SIGNS AND SYMPTOMS Urinary Incontinence can be divided into four types: 1. Urge incontinence Urge incontinence is  the involuntary loss of urine before you have the opportunity to go to the bathroom. There is an sudden urge to void but not enough time to reach a bathroom. 2. Stress incontinence Stress incontinence is the sudden loss of urine with any activity that forces urine to pass. It is commonly caused by anatomical changes to the pelvis and sphincter areas of your body. 3. Overflow incontinence Overflow incontinence is the  loss of urine from an obstructed opening to your bladder. This results in a backup of urine and a resultant buildup of pressure within the bladder. When the pressure within the bladder exceeds the closing pressure of the sphincter, the urine overflows, which causes incontinence, similar to water overflowing a dam. 4. Total incontinence Total incontinence is the loss of urine as a result of the inability to store urine within your bladder. DIAGNOSIS  Evaluating the cause of incontinence may require:  A thorough and complete medical and obstetric history.  A complete physical exam.  Laboratory tests such as a urine culture and sensitivities. When additional tests are indicated, they can include:  An ultrasound exam.  Kidney and bladder X-rays.  Cystoscopy. This is an exam of the bladder using a narrow scope.  Urodynamic testing to test the nerve function to the bladder and sphincter areas. TREATMENT  Treatment for urinary incontinence depends on the cause:  For urge incontinence caused by a bacterial infection, antibiotics will be prescribed. If the urge incontinence is related to medicines you take, your health care provider may have you change the medicine.  For stress incontinence, surgery to re-establish anatomical support to the bladder or sphincter, or both, will often correct the condition.  For overflow incontinence caused by an enlarged prostate, an operation to open the channel through the enlarged prostate will allow the flow of urine out of the bladder. In women with fibroids, a hysterectomy may be recommended.  For total incontinence, surgery on your urinary sphincter may help. An artificial urinary sphincter (an inflatable cuff placed around the urethra) may be required. In women who have developed a hole-like passage between their bladder and vagina (vesicovaginal fistula), surgery to close the fistula often is required. HOME CARE INSTRUCTIONS  Normal daily hygiene and the  use of pads or adult diapers that are changed regularly will help prevent odors and skin damage.  Avoid caffeine. It can overstimulate your bladder.  Use the bathroom regularly. Try about every 2 3 hours to go to the bathroom, even if you do not feel the need to do so. Take time to empty your bladder completely. After urinating, wait a minute. Then try to urinate again.  For causes involving nerve dysfunction, keep a log of the medicines you take and a journal of the times you go to the bathroom. SEEK MEDICAL CARE IF:  You experience worsening of pain instead of improvement in pain after your procedure.  Your incontinence becomes worse instead of better. SEE IMMEDIATE MEDICAL CARE IF:  You experience fever or shaking chills.  You are unable to pass your urine.  You have redness spreading into your groin or down into your thighs. MAKE SURE YOU:   Understand these instructions.   Will watch your condition.  Will get help right away if you are not doing well or get worse. Document Released: 06/16/2004 Document Revised: 01/09/2013 Document Reviewed: 10/16/2012 [facility] Patient Information 2014 [place].

## 2013-05-25 NOTE — ED Provider Notes (Signed)
CSN: [id number]     Arrival date & time 05/25/13  1130 History   First MD Initiated Contact with Patient 05/25/13 1252     Chief Complaint  Patient presents with  . Edema  . Urinary Incontinence  . Flank Pain  . Shortness of Breath   (Consider location/radiation/quality/duration/timing/severity/associated sxs/prior Treatment) HPI Comments: [name] [name] is a 73 y.o. male here for evaluation of urinary incontinence, leg pain, leg swelling, and headache. Was recently admitted to the hospital for headache, and diagnosed with complex migraine. He saw his neurologist in followup 10 days ago. Headache is ongoing for 3 weeks. Urinary incontinence has been present for 3 days. Extremity swelling and pain has been present for several months. He denies fever, chills, nausea, vomiting, weakness, or dizziness. He's taking his usual medications. There are no other known modifying factors.  Patient is a 73 y.o. male presenting with flank pain and shortness of breath. The history is provided by the patient.  Flank Pain Associated symptoms include shortness of breath.  Shortness of Breath   Past Medical History  Diagnosis Date  . CHF (congestive heart failure)   . Obesity   . Hyperlipidemia   . PUD (peptic ulcer disease)   . GERD (gastroesophageal reflux disease)   . S/P endoscopy 2007, 2012    2007: nl, May 2012: antral erosions  . Osteoarthritis   . S/P colonoscopy 2007, Feb and March 2012    hx of adenomas, left-sided diverticula, On 07/2010 TCS, fresh blood and clot noted coming from TI.  Marland Kitchen Anxiety   . Thyroid disease     hypothyroid  . Ringing in ears   . History of kidney stones   . History of bladder infections   . Heel spur     right heel  . Collagen vascular disease     history of venous insufficency and LE cellulitis  . Thrombocytopenia 12/30/2011    Stable  . BPH (benign prostatic hyperplasia)   . Sleep apnea     uses oxygen cannot tolerate CPAP  . Hypertension     Sees Dr.  Debara Pickett  . Bilateral lower leg cellulitis    Past Surgical History  Procedure Laterality Date  . Knee arthroscopy    . Rotator cuff repair    . Cholecystectomy    . Lumbar spine surgery    . Small bowel capsule study  07/2011    ?transient focal ischemia in distal small bowel to explain GI bleeding  . Esophagogastroduodenoscopy  Aug 2013    [facility], single 49mm pedunculated polyp otherwise normal. HYPERPLASTIC, NEGATIVE h.pylori  . Cardiac catheterization      02/27/07: no sign CAD, LVH, mod pulm HTN   Family History  Problem Relation Age of Onset  . Colon cancer Father 24    deceased  . Prostate cancer Father   . Pancreatic cancer Mother 98    deceased   History  Substance Use Topics  . Smoking status: Former Smoker -- 8 years    Types: Cigars    Quit date: 08/10/1973  . Smokeless tobacco: Former Systems developer    Types: [place] date: 08/10/1973     Comment: Pt reports intermittent smoking  . Alcohol Use: No    Review of Systems  Respiratory: Positive for shortness of breath.   Genitourinary: Positive for flank pain.  All other systems reviewed and are negative.    Allergies  Aspirin  Home Medications   Current Outpatient Rx  Name  Route  Sig  Dispense  Refill  . acetaminophen (TYLENOL) 325 MG tablet   Oral   Take 650 mg by mouth every 6 (six) hours as needed for pain.         Marland Kitchen atorvastatin (LIPITOR) 80 MG tablet   Oral   Take 80 mg by mouth at bedtime.         . cetirizine (ZYRTEC) 10 MG tablet   Oral   Take 10 mg by mouth daily.         . clopidogrel (PLAVIX) 75 MG tablet   Oral   Take 1 tablet (75 mg total) by mouth daily with breakfast.   30 tablet   0   . cyclobenzaprine (FLEXERIL) 10 MG tablet   Oral   Take 1 tablet (10 mg total) by mouth 3 (three) times daily as needed for muscle spasms.   45 tablet   0   . docusate sodium (COLACE) 100 MG capsule   Oral   Take 100 mg by mouth at bedtime.         . Dorzolamide HCl-Timolol Mal PF  22.3-6.8 MG/ML SOLN   Ophthalmic   Apply 1 drop to eye 2 (two) times daily. Dorzolamide HCI-Timolol Maleate Ophthalmic Solution 2.23%/0.68%         . furosemide (LASIX) 80 MG tablet   Oral   Take 80 mg by mouth 2 (two) times daily.          Marland Kitchen latanoprost (XALATAN) 0.005 % ophthalmic solution   Both Eyes   Place 1 drop into both eyes at bedtime.         . metoprolol succinate (TOPROL-XL) 25 MG 24 hr tablet   Oral   Take 0.5 tablets (12.5 mg total) by mouth every morning.   30 tablet   11   . ondansetron (ZOFRAN) 4 MG tablet      TAKE ONE TABLET BY MOUTH EVERY 8 HOURS AS NEEDED FOR NAUSEA   30 tablet   0   . oxyCODONE-acetaminophen (PERCOCET) 10-325 MG per tablet   Oral   Take 1 tablet by mouth every 6 (six) hours as needed for pain.   70 tablet   0   . pantoprazole (PROTONIX) 40 MG tablet   Oral   Take 40 mg by mouth 2 (two) times daily.         Vladimir Faster Glycol-Propyl Glycol (LUBRICANT EYE DROPS) 0.4-0.3 % SOLN   Ophthalmic   Apply 1 drop to eye daily as needed (for lubricant eye drops).         . potassium chloride SA (K-DUR,KLOR-CON) 20 MEQ tablet   Oral   Take 1 tablet (20 mEq total) by mouth 2 (two) times daily.   60 tablet   0   . Tamsulosin HCl (FLOMAX) 0.4 MG CAPS   Oral   Take 0.4 mg by mouth daily after supper.          Loura Pardon Salicylate (ASPERCREME EX)   Apply externally   Apply 1 application topically every 6 (six) hours as needed (pain).         . verapamil (CALAN-SR) 120 MG CR tablet   Oral   Take 1 tablet (120 mg total) by mouth at bedtime.   30 tablet   12    BP 134/66  Pulse 79  Temp(Src) 98.4 F (36.9 C) (Oral)  Resp 20  Ht 5\' 9"  (1.753 m)  Wt 285 lb (129.275 kg)  BMI 42.07 kg/m2  SpO2 90%  Physical Exam  Nursing note and vitals reviewed. Constitutional: He is oriented to person, place, and time. He appears well-developed.  Elderly, obese  HENT:  Head: Normocephalic and atraumatic.  Right Ear: External ear  normal.  Left Ear: External ear normal.  Eyes: Conjunctivae and EOM are normal. Pupils are equal, round, and reactive to light.  Neck: Normal range of motion and phonation normal. Neck supple.  Cardiovascular: Normal rate, regular rhythm, normal heart sounds and intact distal pulses.   Pulmonary/Chest: Effort normal and breath sounds normal. He exhibits no bony tenderness.  Abdominal: Soft. Normal appearance. There is no tenderness.  Musculoskeletal: Normal range of motion.  Mild lumbar tenderness bilaterally. Mild, diffuse tenderness, right leg. Symmetric edema on both legs. No localized calf tenderness on either leg.  Neurological: He is alert and oriented to person, place, and time. No cranial nerve deficit or sensory deficit. He exhibits normal muscle tone. Coordination normal.  Skin: Skin is warm, dry and intact.  Psychiatric: He has a normal mood and affect. His behavior is normal. Judgment and thought content normal.    ED Course  Procedures (including critical care time)  Medications  oxyCODONE-acetaminophen (PERCOCET/ROXICET) 5-325 MG per tablet 1 tablet (1 tablet Oral Given 05/25/13 1439)     Patient Vitals for the past 24 hrs:  BP Temp Temp src Pulse Resp SpO2 Height Weight  05/25/13 1141 134/66 mmHg 98.4 F (36.9 C) Oral 79 20 90 % 5\' 9"  (1.753 m) 285 lb (129.275 kg)   3:22 PM Reevaluation with update and discussion. After initial assessment and treatment, an updated evaluation reveals he complains of cramps in his legs, at this time. He thinks his potassium is low. I reassured him, but it was normal. All questions answered, and the discharge plan was discussed. [facility] Review Labs Reviewed  CBC - Abnormal; Notable for the following:    WBC 13.5 (*)    RDW 17.8 (*)    Platelets 117 (*)    All other components within normal limits  BASIC METABOLIC PANEL - Abnormal; Notable for the following:    GFR calc non Af Amer 83 (*)    All other components within  normal limits  URINALYSIS, ROUTINE W REFLEX MICROSCOPIC  TROPONIN I  PRO B NATRIURETIC PEPTIDE   Imaging Review Dg Chest 1 View  05/25/2013   CLINICAL DATA:  Short of breath, swelling  EXAM: CHEST - 1 VIEW  COMPARISON:  Prior chest x-ray 10/14/2012  FINDINGS: Stable appearance of the chest with mild cardiomegaly and pulmonary vascular congestion but no overt pulmonary edema. No airspace consolidation, pleural effusion or pneumothorax. No acute osseous abnormality.  IMPRESSION: Stable chest x-ray with mild cardiomegaly and pulmonary vascular congestion but no overt edema.   Electronically Signed   By: Jacqulynn Cadet M.D.   On: 05/25/2013 12:30    EKG Interpretation    Date/Time:  Saturday May 25 2013 11:45:20 EST Ventricular Rate:  81 PR Interval:  202 QRS Duration: 86 QT Interval:  350 QTC Calculation: 406 R Axis:   11 Text Interpretation:  Normal sinus rhythm Normal ECG When compared with ECG of 03-May-2013 18:11, Borderline criteria for Anterior infarct are no longer Present Confirmed by [facility]  MD, Amel Kitch ([id number]) on 05/25/2013 3:19:25 PM            MDM   1. Myalgia   2. Urinary incontinence    Nonspecific symptoms. Symptoms ongoing for several weeks, and had been evaluated both in the  hospital, and as an outpatient. His new symptom of urinary incontinence is not have associated worrisome symptoms. A urinalysis is normal. I doubt cauda equina, urinary tract infection, bladder, insufficiency.  Plan: Home Medications- usual; Home Treatments- rest; return here if the recommended treatment, does not improve the symptoms; Recommended follow up- Urology 3 days, PCP 5 days  Nursing Notes Reviewed/ Care Coordinated, and agree without changes. Applicable Imaging Reviewed.  Interpretation of Laboratory Data incorporated into ED treatment           Richarda Blade, MD 05/25/13 1525

## 2013-05-25 NOTE — ED Notes (Signed)
Pt c/o extremity edema, SOB, urinary incontinence and bilat flank pain. Pt reports symptoms started 2-3 days ago. Pt states he has been on oxygen all night. Pt reports he was sent to [facility] recently for TIA.

## 2013-05-30 DIAGNOSIS — H4010X0 Unspecified open-angle glaucoma, stage unspecified: Secondary | ICD-10-CM | POA: Diagnosis not present

## 2013-05-30 DIAGNOSIS — H2510 Age-related nuclear cataract, unspecified eye: Secondary | ICD-10-CM | POA: Diagnosis not present

## 2013-05-30 DIAGNOSIS — R972 Elevated prostate specific antigen [PSA]: Secondary | ICD-10-CM | POA: Diagnosis not present

## 2013-06-04 DIAGNOSIS — N400 Enlarged prostate without lower urinary tract symptoms: Secondary | ICD-10-CM | POA: Diagnosis not present

## 2013-06-09 ENCOUNTER — Encounter (HOSPITAL_COMMUNITY): Payer: Self-pay | Admitting: Emergency Medicine

## 2013-06-09 ENCOUNTER — Emergency Department (HOSPITAL_COMMUNITY)
Admission: EM | Admit: 2013-06-09 | Discharge: 2013-06-09 | Disposition: A | Discharge status: Home / Self Care | Payer: Medicare Other | Attending: Emergency Medicine | Admitting: Emergency Medicine

## 2013-06-09 ENCOUNTER — Emergency Department (HOSPITAL_COMMUNITY): Payer: Medicare Other

## 2013-06-09 DIAGNOSIS — R300 Dysuria: Secondary | ICD-10-CM | POA: Insufficient documentation

## 2013-06-09 DIAGNOSIS — Z862 Personal history of diseases of the blood and blood-forming organs and certain disorders involving the immune mechanism: Secondary | ICD-10-CM | POA: Diagnosis not present

## 2013-06-09 DIAGNOSIS — M1990 Unspecified osteoarthritis, unspecified site: Secondary | ICD-10-CM | POA: Diagnosis not present

## 2013-06-09 DIAGNOSIS — I509 Heart failure, unspecified: Secondary | ICD-10-CM | POA: Diagnosis not present

## 2013-06-09 DIAGNOSIS — M7989 Other specified soft tissue disorders: Secondary | ICD-10-CM | POA: Diagnosis not present

## 2013-06-09 DIAGNOSIS — G4730 Sleep apnea, unspecified: Secondary | ICD-10-CM | POA: Diagnosis not present

## 2013-06-09 DIAGNOSIS — R059 Cough, unspecified: Secondary | ICD-10-CM | POA: Diagnosis not present

## 2013-06-09 DIAGNOSIS — R05 Cough: Secondary | ICD-10-CM | POA: Insufficient documentation

## 2013-06-09 DIAGNOSIS — M549 Dorsalgia, unspecified: Secondary | ICD-10-CM | POA: Diagnosis not present

## 2013-06-09 DIAGNOSIS — Z8659 Personal history of other mental and behavioral disorders: Secondary | ICD-10-CM | POA: Insufficient documentation

## 2013-06-09 DIAGNOSIS — E669 Obesity, unspecified: Secondary | ICD-10-CM | POA: Diagnosis not present

## 2013-06-09 DIAGNOSIS — Z8711 Personal history of peptic ulcer disease: Secondary | ICD-10-CM | POA: Diagnosis not present

## 2013-06-09 DIAGNOSIS — R310 Gross hematuria: Secondary | ICD-10-CM | POA: Diagnosis not present

## 2013-06-09 DIAGNOSIS — N509 Disorder of male genital organs, unspecified: Secondary | ICD-10-CM | POA: Insufficient documentation

## 2013-06-09 DIAGNOSIS — Z87442 Personal history of urinary calculi: Secondary | ICD-10-CM | POA: Diagnosis not present

## 2013-06-09 DIAGNOSIS — H538 Other visual disturbances: Secondary | ICD-10-CM | POA: Diagnosis not present

## 2013-06-09 DIAGNOSIS — Y846 Urinary catheterization as the cause of abnormal reaction of the patient, or of later complication, without mention of misadventure at the time of the procedure: Secondary | ICD-10-CM | POA: Insufficient documentation

## 2013-06-09 DIAGNOSIS — T8389XA Other specified complication of genitourinary prosthetic devices, implants and grafts, initial encounter: Secondary | ICD-10-CM | POA: Insufficient documentation

## 2013-06-09 DIAGNOSIS — Z87891 Personal history of nicotine dependence: Secondary | ICD-10-CM | POA: Diagnosis not present

## 2013-06-09 DIAGNOSIS — Z872 Personal history of diseases of the skin and subcutaneous tissue: Secondary | ICD-10-CM | POA: Insufficient documentation

## 2013-06-09 DIAGNOSIS — Z79899 Other long term (current) drug therapy: Secondary | ICD-10-CM | POA: Insufficient documentation

## 2013-06-09 DIAGNOSIS — Z8669 Personal history of other diseases of the nervous system and sense organs: Secondary | ICD-10-CM | POA: Insufficient documentation

## 2013-06-09 DIAGNOSIS — E785 Hyperlipidemia, unspecified: Secondary | ICD-10-CM | POA: Insufficient documentation

## 2013-06-09 DIAGNOSIS — Z87440 Personal history of urinary (tract) infections: Secondary | ICD-10-CM | POA: Insufficient documentation

## 2013-06-09 DIAGNOSIS — T83091A Other mechanical complication of indwelling urethral catheter, initial encounter: Secondary | ICD-10-CM | POA: Diagnosis not present

## 2013-06-09 DIAGNOSIS — K219 Gastro-esophageal reflux disease without esophagitis: Secondary | ICD-10-CM | POA: Diagnosis not present

## 2013-06-09 DIAGNOSIS — N400 Enlarged prostate without lower urinary tract symptoms: Secondary | ICD-10-CM | POA: Insufficient documentation

## 2013-06-09 DIAGNOSIS — I10 Essential (primary) hypertension: Secondary | ICD-10-CM | POA: Diagnosis not present

## 2013-06-09 DIAGNOSIS — Z9889 Other specified postprocedural states: Secondary | ICD-10-CM | POA: Insufficient documentation

## 2013-06-09 DIAGNOSIS — R51 Headache: Secondary | ICD-10-CM | POA: Diagnosis not present

## 2013-06-09 DIAGNOSIS — T839XXA Unspecified complication of genitourinary prosthetic device, implant and graft, initial encounter: Secondary | ICD-10-CM

## 2013-06-09 DIAGNOSIS — Z7902 Long term (current) use of antithrombotics/antiplatelets: Secondary | ICD-10-CM | POA: Insufficient documentation

## 2013-06-09 DIAGNOSIS — R319 Hematuria, unspecified: Secondary | ICD-10-CM | POA: Diagnosis not present

## 2013-06-09 LAB — CBC WITH DIFFERENTIAL/PLATELET
BASOS ABS: 0.1 10*3/uL (ref 0.0–0.1)
BASOS PCT: 0 % (ref 0–1)
EOS ABS: 0.1 10*3/uL (ref 0.0–0.7)
EOS PCT: 1 % (ref 0–5)
HEMATOCRIT: 41.4 % (ref 39.0–52.0)
Hemoglobin: 14.2 g/dL (ref 13.0–17.0)
Lymphocytes Relative: 8 % — ABNORMAL LOW (ref 12–46)
Lymphs Abs: 1.1 10*3/uL (ref 0.7–4.0)
MCH: 27.5 pg (ref 26.0–34.0)
MCHC: 34.3 g/dL (ref 30.0–36.0)
MCV: 80.1 fL (ref 78.0–100.0)
MONO ABS: 1.2 10*3/uL — AB (ref 0.1–1.0)
Monocytes Relative: 9 % (ref 3–12)
Neutro Abs: 10.6 10*3/uL — ABNORMAL HIGH (ref 1.7–7.7)
Neutrophils Relative %: 81 % — ABNORMAL HIGH (ref 43–77)
PLATELETS: 120 10*3/uL — AB (ref 150–400)
RBC: 5.17 MIL/uL (ref 4.22–5.81)
RDW: 17.6 % — AB (ref 11.5–15.5)
WBC: 13 10*3/uL — ABNORMAL HIGH (ref 4.0–10.5)

## 2013-06-09 LAB — BASIC METABOLIC PANEL
BUN: 14 mg/dL (ref 6–23)
CALCIUM: 9 mg/dL (ref 8.4–10.5)
CO2: 33 mEq/L — ABNORMAL HIGH (ref 19–32)
CREATININE: 0.97 mg/dL (ref 0.50–1.35)
Chloride: 99 mEq/L (ref 96–112)
GFR, EST NON AFRICAN AMERICAN: 80 mL/min — AB (ref >90)
Glucose, Bld: 93 mg/dL (ref 70–99)
Potassium: 3.9 mEq/L (ref 3.7–5.3)
Sodium: 141 mEq/L (ref 137–147)

## 2013-06-09 LAB — URINALYSIS, ROUTINE W REFLEX MICROSCOPIC
Bilirubin Urine: NEGATIVE
Glucose, UA: NEGATIVE mg/dL
KETONES UR: NEGATIVE mg/dL
NITRITE: NEGATIVE
PH: 6 (ref 5.0–8.0)
Protein, ur: 30 mg/dL — AB
Specific Gravity, Urine: <1.005 — ABNORMAL LOW (ref 1.005–1.030)
UROBILINOGEN UA: 0.2 mg/dL (ref 0.0–1.0)

## 2013-06-09 LAB — URINE MICROSCOPIC-ADD ON

## 2013-06-09 MED ORDER — HYDROMORPHONE HCL 2 MG PO TABS
2.0000 mg | ORAL_TABLET | Freq: Once | ORAL | Status: AC
  Administered 2013-06-09: 2 mg via ORAL
  Filled 2013-06-09: qty 1

## 2013-06-09 MED ORDER — HYDROMORPHONE HCL 2 MG PO TABS: 2.0000 mg | ORAL_TABLET | ORAL | Status: DC | PRN

## 2013-06-09 MED ORDER — CEPHALEXIN 500 MG PO CAPS
500.0000 mg | ORAL_CAPSULE | Freq: Once | ORAL | Status: AC
  Administered 2013-06-09: 500 mg via ORAL
  Filled 2013-06-09: qty 1

## 2013-06-09 NOTE — ED Notes (Signed)
Foley catheter irrigated with 60cc-30cc returned, bulb deflated and catheter advanced, foley irrigated again with 30cc-15cc returned, and new drainage bag placed. EDP notified.

## 2013-06-09 NOTE — ED Provider Notes (Signed)
CSN: [id number]     Arrival date & time 06/09/13  1244 History   This chart was scribed for Mervin Kung, MD, by Neta Ehlers, ED Scribe. This patient was seen in room APA16A/APA16A and the patient's care was started at 1:44 PM .  First MD Initiated Contact with Patient 06/09/13 1311     Chief Complaint  Patient presents with  . Hematuria   Patient is a 73 y.o. male presenting with hematuria. The history is provided by the patient. No language interpreter was used.  Hematuria This is a new problem. The current episode started 3 to 5 hours ago. The problem occurs rarely. The problem has been gradually improving. Associated symptoms include headaches. Pertinent negatives include no chest pain and no shortness of breath. The symptoms are aggravated by standing. The symptoms are relieved by narcotics (Percocet). The treatment provided mild relief.   HPI Comments: Shlomy Palacio is a 73 y.o. male, with a h/o renal calculi and bladder infections, who presents to the Emergency Department complaining of hematuria which was first noticed this morning and has been associated with urinary difficulty and dysuria. He states he experienced difficulty urinating this morning, and when he forced the urine out, he experienced pain and noticed a blood clot came  through the cath. The pt uses a foley cath due to frequency with minimal voiding; the cath was placed by Dr. Michela Pitcher six days ago. He also reports pain to his kidney and testicles upon standing; the pt has experienced the pain intermittently for a week. He states the pain increased after the cath was placed.  He has used percocet for pain control as needed. He also takes Lasix. The pt states he is waiting for an appointment to assess his prostate. The pt is a former smoker.   Dr. Danielle Dess is the pt's PCP.   Past Medical History  Diagnosis Date  . CHF (congestive heart failure)   . Obesity   . Hyperlipidemia   . PUD (peptic ulcer disease)   . GERD  (gastroesophageal reflux disease)   . S/P endoscopy 2007, 2012    2007: nl, May 2012: antral erosions  . Osteoarthritis   . S/P colonoscopy 2007, Feb and March 2012    hx of adenomas, left-sided diverticula, On 07/2010 TCS, fresh blood and clot noted coming from TI.  Marland Kitchen Anxiety   . Thyroid disease     hypothyroid  . Ringing in ears   . History of kidney stones   . History of bladder infections   . Heel spur     right heel  . Collagen vascular disease     history of venous insufficency and LE cellulitis  . Thrombocytopenia 12/30/2011    Stable  . BPH (benign prostatic hyperplasia)   . Sleep apnea     uses oxygen cannot tolerate CPAP  . Hypertension     Sees Dr. Debara Pickett  . Bilateral lower leg cellulitis    Past Surgical History  Procedure Laterality Date  . Knee arthroscopy    . Rotator cuff repair    . Cholecystectomy    . Lumbar spine surgery    . Small bowel capsule study  07/2011    ?transient focal ischemia in distal small bowel to explain GI bleeding  . Esophagogastroduodenoscopy  Aug 2013    [facility], single 65mm pedunculated polyp otherwise normal. HYPERPLASTIC, NEGATIVE h.pylori  . Cardiac catheterization      02/27/07: no sign CAD, LVH, mod pulm HTN   Family  History  Problem Relation Age of Onset  . Colon cancer Father 95    deceased  . Prostate cancer Father   . Pancreatic cancer Mother 52    deceased   History  Substance Use Topics  . Smoking status: Former Smoker -- 8 years    Types: Cigars    Quit date: 08/10/1973  . Smokeless tobacco: Former Neurosurgeon    Types: Chew    Quit date: 08/10/1973     Comment: Pt reports intermittent smoking  . Alcohol Use: No    Review of Systems  Constitutional: Negative for fever.  HENT: Negative for congestion, rhinorrhea and sore throat.   Eyes: Positive for visual disturbance (From previous TIA. ).  Respiratory: Positive for cough. Negative for shortness of breath.   Cardiovascular: Positive for leg swelling (Baseline).  Negative for chest pain.  Genitourinary: Positive for hematuria, difficulty urinating and testicular pain.  Musculoskeletal: Positive for back pain.  Skin: Negative for rash.  Neurological: Positive for headaches.  Hematological: Does not bruise/bleed easily.  All other systems reviewed and are negative.   Allergies  Aspirin  Home Medications   Current Outpatient Rx  Name  Route  Sig  Dispense  Refill  . acetaminophen (TYLENOL) 325 MG tablet   Oral   Take 650 mg by mouth every 6 (six) hours as needed for pain.         Marland Kitchen atorvastatin (LIPITOR) 80 MG tablet   Oral   Take 80 mg by mouth at bedtime.         . cetirizine (ZYRTEC) 10 MG tablet   Oral   Take 10 mg by mouth daily.         . clopidogrel (PLAVIX) 75 MG tablet   Oral   Take 1 tablet (75 mg total) by mouth daily with breakfast.   30 tablet   0   . cyclobenzaprine (FLEXERIL) 10 MG tablet   Oral   Take 1 tablet (10 mg total) by mouth 3 (three) times daily as needed for muscle spasms.   45 tablet   0   . docusate sodium (COLACE) 100 MG capsule   Oral   Take 100 mg by mouth at bedtime.         . Dorzolamide HCl-Timolol Mal PF 22.3-6.8 MG/ML SOLN   Ophthalmic   Apply 1 drop to eye 2 (two) times daily. Dorzolamide HCI-Timolol Maleate Ophthalmic Solution 2.23%/0.68%         . furosemide (LASIX) 80 MG tablet   Oral   Take 80 mg by mouth 2 (two) times daily.          Marland Kitchen latanoprost (XALATAN) 0.005 % ophthalmic solution   Both Eyes   Place 1 drop into both eyes at bedtime.         . metoprolol succinate (TOPROL-XL) 25 MG 24 hr tablet   Oral   Take 0.5 tablets (12.5 mg total) by mouth every morning.   30 tablet   11   . ondansetron (ZOFRAN) 4 MG tablet      TAKE ONE TABLET BY MOUTH EVERY 8 HOURS AS NEEDED FOR NAUSEA   30 tablet   0   . oxyCODONE-acetaminophen (PERCOCET) 10-325 MG per tablet   Oral   Take 1 tablet by mouth every 6 (six) hours as needed for pain.   70 tablet   0   .  pantoprazole (PROTONIX) 40 MG tablet   Oral   Take 40 mg by mouth 2 (two) times  daily.         . Polyethyl Glycol-Propyl Glycol (LUBRICANT EYE DROPS) 0.4-0.3 % SOLN   Ophthalmic   Apply 1 drop to eye daily as needed (for lubricant eye drops).         . potassium chloride SA (K-DUR,KLOR-CON) 20 MEQ tablet   Oral   Take 1 tablet (20 mEq total) by mouth 2 (two) times daily.   60 tablet   0   . Tamsulosin HCl (FLOMAX) 0.4 MG CAPS   Oral   Take 0.4 mg by mouth daily after supper.          Loura Pardon Salicylate (ASPERCREME EX)   Apply externally   Apply 1 application topically every 6 (six) hours as needed (pain).         . verapamil (CALAN-SR) 120 MG CR tablet   Oral   Take 1 tablet (120 mg total) by mouth at bedtime.   30 tablet   12   . HYDROmorphone (DILAUDID) 2 MG tablet   Oral   Take 1 tablet (2 mg total) by mouth every 4 (four) hours as needed for severe pain.   20 tablet   0    Triage Vitals: BP 140/70  Pulse 103  Temp(Src) 98.6 F (37 C) (Oral)  Resp 18  Ht 5\' 9"  (1.753 m)  Wt 275 lb (124.739 kg)  BMI 40.59 kg/m2  SpO2 92%  Physical Exam  Nursing note and vitals reviewed. Constitutional: He is oriented to person, place, and time. He appears well-developed and well-nourished. No distress.  HENT:  Head: Normocephalic and atraumatic.  Eyes: EOM are normal.  Neck: Neck supple. No tracheal deviation present.  Cardiovascular: Normal rate, regular rhythm and normal heart sounds.   Pulmonary/Chest: Effort normal and breath sounds normal. No respiratory distress. He has no wheezes.  Abdominal: Bowel sounds are normal. There is tenderness.  Suprapubic discomfort. Cannot palpate distension of bladder.   No tenderness to upper quadrant abdomen.   Musculoskeletal: Normal range of motion. He exhibits edema.  Marked pitted edema to bilateral lower extremities.   Neurological: He is alert and oriented to person, place, and time.  Skin: Skin is warm and dry.   Psychiatric: He has a normal mood and affect. His behavior is normal.    ED Course  Procedures (including critical care time)  DIAGNOSTIC STUDIES: Oxygen Saturation is 92% on room air, low by my interpretation.    COORDINATION OF CARE:  1:58 PM- Discussed treatment plan with patient, which includes a UA and an analysis of urine flow, and the patient agreed to the plan. Advised the pt to stay well-hydrated.   Labs Review Labs Reviewed  URINALYSIS, ROUTINE W REFLEX MICROSCOPIC - Abnormal; Notable for the following:    Color, Urine RED (*)    APPearance CLOUDY (*)    Specific Gravity, Urine <1.005 (*)    Hgb urine dipstick LARGE (*)    Protein, ur 30 (*)    Leukocytes, UA MODERATE (*)    All other components within normal limits  BASIC METABOLIC PANEL - Abnormal; Notable for the following:    CO2 33 (*)    GFR calc non Af Amer 80 (*)    All other components within normal limits  URINE MICROSCOPIC-ADD ON - Abnormal; Notable for the following:    Bacteria, UA FEW (*)    All other components within normal limits  CBC WITH DIFFERENTIAL - Abnormal; Notable for the following:    WBC 13.0 (*)  RDW 17.6 (*)    Platelets 120 (*)    Neutrophils Relative % 81 (*)    Neutro Abs 10.6 (*)    Lymphocytes Relative 8 (*)    Monocytes Absolute 1.2 (*)    All other components within normal limits   Imaging Review Ct Abdomen Pelvis Wo Contrast  06/09/2013   CLINICAL DATA:  Gross hematuria.  History of kidney stones.  EXAM: CT ABDOMEN AND PELVIS WITHOUT CONTRAST  TECHNIQUE: Multidetector CT imaging of the abdomen and pelvis was performed following the standard protocol without intravenous contrast.  COMPARISON:  View abdomen or/16/14. CT abdomen and pelvis 02/27/2012.  FINDINGS: Mild dependent atelectasis or scarring is evident. No focal nodule, mass, or airspace disease is present. The heart size is normal. No significant pleural or pericardial effusion is evident.  The liver and spleen are  within normal limits. The stomach, duodenum, and pancreas are unremarkable. The common bile duct is within normal limits following cholecystectomy. The adrenal glands are normal bilaterally. The kidneys lie somewhat low, without change. Multiple low-density lesions were previously investigated by ultrasound and found to be cysts. These are unchanged.  There is no evidence for nephrolithiasis or hydronephrosis. Mild stranding about both kidneys is similar to the prior exam.  Minimal atherosclerotic calcifications are present in the abdominal aorta without aneurysm. Rectosigmoid colon is within normal limits. The remainder of the colon is unremarkable. The appendix is visualized and normal. The small bowel is within normal limits. A Foley catheter is present within the urinary bladder. There is fluid or soft tissue within the right inguinal canal.  The bone windows demonstrate stable fusion at L4-5. No focal lytic or blastic lesions are present. Anterior osteophytes are fused in the lower thoracic spine.  IMPRESSION: 1. No evidence for nephrolithiasis or urinary obstruction. 2. Low-density lesions in both kidneys are stable, likely cysts. 3. Status post cholecystectomy and lumbar fusion.   Electronically Signed   By: Lawrence Santiago M.D.   On: 06/09/2013 16:40    EKG Interpretation   None       MDM   1. Hematuria   2. Foley catheter problem    Patient with hematuria not consistent with urinary tract infection. Patient with irrigation had some return Foley was adjusted pushed in further balloon reinflated patient is a better flow since that time. No further passage of clots. Patient's already being followed by urology with the procedure plan cystoscope sometime in the near future. Patient will call  urology for further followup.  Patient's renal function is normal. CT scan of the abdomen and pelvis without contrast without any significant findings.   I personally performed the services described in  this documentation, which was scribed in my presence. The recorded information has been reviewed and is accurate.     Mervin Kung, MD 06/09/13 [phone]

## 2013-06-09 NOTE — Discharge Instructions (Signed)
Hematuria, Adult Hematuria is blood in your urine. It can be caused by a bladder infection, kidney infection, prostate infection, kidney stone, or cancer of your urinary tract. Infections can usually be treated with medicine, and a kidney stone usually will pass through your urine. If neither of these is the cause of your hematuria, further workup to find out the reason may be needed. It is very important that you tell your health care provider about any blood you see in your urine, even if the blood stops without treatment or happens without causing pain. Blood in your urine that happens and then stops and then happens again can be a symptom of a very serious condition. Also, pain is not a symptom in the initial stages of many urinary cancers. HOME CARE INSTRUCTIONS   Drink lots of fluid, 3 4 quarts a day. If you have been diagnosed with an infection, cranberry juice is especially recommended, in addition to large amounts of water.  Avoid caffeine, tea, and carbonated beverages, because they tend to irritate the bladder.  Avoid alcohol because it may irritate the prostate.  Only take over-the-counter or prescription medicines for pain, discomfort, or fever as directed by your health care provider.  If you have been diagnosed with a kidney stone, follow your health care provider's instructions regarding straining your urine to catch the stone.  Empty your bladder often. Avoid holding urine for long periods of time.  After a bowel movement, women should cleanse front to back. Use each tissue only once.  Empty your bladder before and after sexual intercourse if you are a male. SEEK MEDICAL CARE IF: You develop back pain, fever, a feeling of sickness in your stomach (nausea), or vomiting or if your symptoms are not better in 3 days. Return sooner if you are getting worse. SEEK IMMEDIATE MEDICAL CARE IF:   You have a persistent fever, with a temperature of 101.66F (38.8C) or greater.  You  develop severe vomiting and are unable to keep the medicine down.  You develop severe back or abdominal pain despite taking your medicines.  You begin passing a large amount of blood or clots in your urine.  You feel extremely weak or faint, or you pass out. MAKE SURE YOU:   Understand these instructions.  Will watch your condition.  Will get help right away if you are not doing well or get worse. Document Released: 05/09/2005 Document Revised: 02/27/2013 Document Reviewed: 01/07/2013 [facility] Patient Information 2014 [place].  Return for any recurrent urinating problems. Call urologist tomorrow for followup. Take pain medication as directed. On the hydromorphone orally he can take 2 every 6 hours if you need to. Continue to take your Percocet.

## 2013-06-09 NOTE — ED Notes (Signed)
Pt states hematuria began this morning. States he is having to strain for urine to flow in catheter. (foley cath in place on arrival). Pt states while dumping urine this morning, he noticed a large clot.

## 2013-06-11 ENCOUNTER — Ambulatory Visit (HOSPITAL_COMMUNITY)
Admission: RE | Admit: 2013-06-11 | Discharge: 2013-06-11 | Disposition: A | Discharge status: Home / Self Care | Payer: Medicare Other | Source: Ambulatory Visit | Attending: Family Medicine | Admitting: Family Medicine

## 2013-06-11 ENCOUNTER — Other Ambulatory Visit (HOSPITAL_COMMUNITY): Payer: Self-pay | Admitting: Family Medicine

## 2013-06-11 DIAGNOSIS — N139 Obstructive and reflux uropathy, unspecified: Secondary | ICD-10-CM | POA: Diagnosis not present

## 2013-06-11 DIAGNOSIS — R319 Hematuria, unspecified: Secondary | ICD-10-CM | POA: Diagnosis not present

## 2013-06-11 DIAGNOSIS — I509 Heart failure, unspecified: Secondary | ICD-10-CM | POA: Diagnosis not present

## 2013-06-11 DIAGNOSIS — Z6841 Body Mass Index (BMI) 40.0 and over, adult: Secondary | ICD-10-CM | POA: Diagnosis not present

## 2013-06-11 DIAGNOSIS — Z01818 Encounter for other preprocedural examination: Secondary | ICD-10-CM | POA: Insufficient documentation

## 2013-06-11 DIAGNOSIS — R0989 Other specified symptoms and signs involving the circulatory and respiratory systems: Secondary | ICD-10-CM

## 2013-06-11 DIAGNOSIS — Z87891 Personal history of nicotine dependence: Secondary | ICD-10-CM | POA: Insufficient documentation

## 2013-06-11 DIAGNOSIS — I10 Essential (primary) hypertension: Secondary | ICD-10-CM | POA: Diagnosis not present

## 2013-06-12 ENCOUNTER — Encounter: Payer: Self-pay | Admitting: Cardiology

## 2013-06-12 ENCOUNTER — Ambulatory Visit (INDEPENDENT_AMBULATORY_CARE_PROVIDER_SITE_OTHER): Payer: Medicare Other | Admitting: Cardiology

## 2013-06-12 VITALS — BP 102/60 | HR 87 | Ht 69.0 in | Wt 285.0 lb

## 2013-06-12 DIAGNOSIS — R609 Edema, unspecified: Secondary | ICD-10-CM | POA: Diagnosis not present

## 2013-06-12 DIAGNOSIS — I2510 Atherosclerotic heart disease of native coronary artery without angina pectoris: Secondary | ICD-10-CM

## 2013-06-12 DIAGNOSIS — E669 Obesity, unspecified: Secondary | ICD-10-CM

## 2013-06-12 DIAGNOSIS — Z01810 Encounter for preprocedural cardiovascular examination: Secondary | ICD-10-CM

## 2013-06-12 DIAGNOSIS — G4733 Obstructive sleep apnea (adult) (pediatric): Secondary | ICD-10-CM | POA: Diagnosis not present

## 2013-06-12 DIAGNOSIS — I50810 Right heart failure, unspecified: Secondary | ICD-10-CM

## 2013-06-12 DIAGNOSIS — I509 Heart failure, unspecified: Secondary | ICD-10-CM

## 2013-06-12 NOTE — Assessment & Plan Note (Signed)
Recent echo 12/14 shows normal LVF, grade 1 diastolic dysfunction, RV not well seen, no PA pressures recorded

## 2013-06-12 NOTE — Progress Notes (Signed)
06/12/2013 [name] [name]   08-Aug-1940  [id number]  Primary Physicia [name]., MD Primary Cardiologist: Dr Debara Pickett  HPI:  73 y/o followed by Dr Rollene Fare in the past, now Dr Debara Pickett. 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 He has chronic lower extremity edema which has been attributed to chronic Rt heart failure, (though this has not been documented by echo). He did have venous studies done and was not felt to have incompetent veins. He is in the office today for pre op clearance prior to proposed prostate surgery. He has not had chest pain or unusual SOB. His edema is no worse than usual. He was admitted in December 2014 with Rt sided weakness and an abnormal CT scan but an MRI did not reveal any stroke. MRA of his neck revealed no carotid disease. Echo showed an EF of 65-70% with grade 1 diastolic dysfunction. He does have morbid obesity and sleep apnea. He is on O2 at night, he could not tolerate C-pap.    Current Outpatient Prescriptions  Medication Sig Dispense Refill  . acetaminophen (TYLENOL) 325 MG tablet Take 650 mg by mouth every 6 (six) hours as needed for pain.      Marland Kitchen atorvastatin (LIPITOR) 80 MG tablet Take 80 mg by mouth at bedtime.      . cetirizine (ZYRTEC) 10 MG tablet Take 10 mg by mouth daily.      . clopidogrel (PLAVIX) 75 MG tablet Take 1 tablet (75 mg total) by mouth daily with breakfast.  30 tablet  0  . cyclobenzaprine (FLEXERIL) 10 MG tablet Take 1 tablet (10 mg total) by mouth 3 (three) times daily as needed for muscle spasms.  45 tablet  0  . docusate sodium (COLACE) 100 MG capsule Take 100 mg by mouth at bedtime.      . Dorzolamide HCl-Timolol Mal PF 22.3-6.8 MG/ML SOLN Apply 1 drop to eye 2 (two) times daily. Dorzolamide HCI-Timolol Maleate Ophthalmic Solution 2.23%/0.68%      . furosemide (LASIX) 80 MG tablet Take 80 mg by mouth 2 (two) times daily.       Marland Kitchen HYDROmorphone (DILAUDID) 2 MG tablet Take 1 tablet (2 mg total) by mouth every 4 (four) hours  as needed for severe pain.  20 tablet  0  . latanoprost (XALATAN) 0.005 % ophthalmic solution Place 1 drop into both eyes at bedtime.      . metoprolol succinate (TOPROL-XL) 25 MG 24 hr tablet Take 0.5 tablets (12.5 mg total) by mouth every morning.  30 tablet  11  . ondansetron (ZOFRAN) 4 MG tablet TAKE ONE TABLET BY MOUTH EVERY 8 HOURS AS NEEDED FOR NAUSEA  30 tablet  0  . oxyCODONE-acetaminophen (PERCOCET) 10-325 MG per tablet Take 1 tablet by mouth every 6 (six) hours as needed for pain.  70 tablet  0  . pantoprazole (PROTONIX) 40 MG tablet Take 40 mg by mouth 2 (two) times daily.      Vladimir Faster Glycol-Propyl Glycol (LUBRICANT EYE DROPS) 0.4-0.3 % SOLN Apply 1 drop to eye daily as needed (for lubricant eye drops).      . potassium chloride SA (K-DUR,KLOR-CON) 20 MEQ tablet Take 1 tablet (20 mEq total) by mouth 2 (two) times daily.  60 tablet  0  . Tamsulosin HCl (FLOMAX) 0.4 MG CAPS Take 0.4 mg by mouth daily after supper.       Loura Pardon Salicylate (ASPERCREME EX) Apply 1 application topically every 6 (six) hours as needed (pain).      Marland Kitchen  verapamil (CALAN-SR) 120 MG CR tablet Take 1 tablet (120 mg total) by mouth at bedtime.  30 tablet  12   No current facility-administered medications for this visit.    Allergies  Allergen Reactions  . Aspirin Other (See Comments)    Nausea and upset stomach      History   Social History  . Marital Status: Married    Spouse Name: LaRuth    Number of Children: 5  . Years of Education: College   Occupational History  . disability    Social History Main Topics  . Smoking status: Former Smoker -- 8 years    Types: Cigars    Quit date: 08/10/1973  . Smokeless tobacco: Former Systems developer    Types: [place] date: 08/10/1973     Comment: Pt reports intermittent smoking  . Alcohol Use: No  . Drug Use: No  . Sexual Activity: No   Other Topics Concern  . Not on file   Social History Narrative   Patient lives at home with spouse and son.    Caffeine Use: 2 cups weekly     Review of Systems: General: negative for chills, fever, night sweats or weight changes.  Cardiovascular: negative for chest pain, dyspnea on exertion, edema, orthopnea, palpitations, paroxysmal nocturnal dyspnea or shortness of breath Dermatological: negative for rash Respiratory: negative for cough or wheezing Urologic: negative for hematuria Abdominal: negative for nausea, vomiting, diarrhea, bright red blood per rectum, melena, or hematemesis Neurologic: negative for visual changes, syncope, or dizziness All other systems reviewed and are otherwise negative except as noted above.    Blood pressure 102/60, pulse 87, height 5\' 9"  (1.753 m), weight 285 lb (129.275 kg).  General appearance: alert, cooperative, morbidly obese and in wheelchair with foley in place Neck: no carotid bruit and no JVD Lungs: clear to auscultation bilaterally Heart: regular rate and rhythm Extremities: 2-3+ bilat edema to knees  EKG NSR without acute changes  ASSESSMENT AND PLAN:   Pre-operative cardiovascular examination Needs pre op clearance for prostate surgery  Edema Chronic lower extremity edema  Obesity .  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 We will be available if needed.He should see Dr Debara Pickett in 2 months for his annual check up.  Lynora Dymond KPA-C 06/12/2013 4:59 PM

## 2013-06-12 NOTE — Assessment & Plan Note (Signed)
On home O2 at night, intolerant of CPAP

## 2013-06-12 NOTE — Patient Instructions (Signed)
OK for surgery.

## 2013-06-12 NOTE — Assessment & Plan Note (Signed)
Needs pre op clearance for prostate surgery

## 2013-06-12 NOTE — Assessment & Plan Note (Signed)
Chronic lower extremity edema

## 2013-06-12 NOTE — Assessment & Plan Note (Signed)
No chest pain

## 2013-06-13 ENCOUNTER — Telehealth: Payer: Self-pay | Admitting: Cardiology

## 2013-06-13 NOTE — Telephone Encounter (Signed)
Stop for five days prior to surgery.  Restart when ok with surgeon.  [name] [name] 4:04 PM

## 2013-06-13 NOTE — Telephone Encounter (Signed)
Pt is scheduled for a TURP. Just found out pt is on Clopidogrel, does he need to stop this and if so how many days?

## 2013-06-13 NOTE — Telephone Encounter (Signed)
Returned call and informed Mardene Celeste per instructions by PA.  Verbalized understanding and agreed w/ plan.

## 2013-06-13 NOTE — Telephone Encounter (Signed)
Message forwarded to B. Samara Snide, PA-C for further instructions.

## 2013-06-14 ENCOUNTER — Encounter (HOSPITAL_COMMUNITY): Payer: Self-pay | Admitting: Pharmacy Technician

## 2013-06-14 ENCOUNTER — Encounter (HOSPITAL_COMMUNITY): Payer: Self-pay

## 2013-06-14 ENCOUNTER — Encounter (HOSPITAL_COMMUNITY)
Admission: RE | Admit: 2013-06-14 | Discharge: 2013-06-14 | Disposition: A | Discharge status: Home / Self Care | Payer: Medicare Other | Source: Ambulatory Visit | Attending: Urology | Admitting: Urology

## 2013-06-14 DIAGNOSIS — Z01818 Encounter for other preprocedural examination: Secondary | ICD-10-CM | POA: Diagnosis not present

## 2013-06-14 DIAGNOSIS — Z01812 Encounter for preprocedural laboratory examination: Secondary | ICD-10-CM | POA: Diagnosis not present

## 2013-06-14 HISTORY — DX: Adverse effect of unspecified anesthetic, initial encounter: T41.45XA

## 2013-06-14 HISTORY — DX: Other specified postprocedural states: R11.2

## 2013-06-14 HISTORY — DX: Other specified postprocedural states: Z98.890

## 2013-06-14 HISTORY — DX: Other complications of anesthesia, initial encounter: T88.59XA

## 2013-06-14 LAB — HEMOGLOBIN AND HEMATOCRIT, BLOOD
HEMATOCRIT: 44.2 % (ref 39.0–52.0)
HEMOGLOBIN: 14.4 g/dL (ref 13.0–17.0)

## 2013-06-14 LAB — BASIC METABOLIC PANEL
BUN: 15 mg/dL (ref 6–23)
CO2: 33 mEq/L — ABNORMAL HIGH (ref 19–32)
CREATININE: 0.94 mg/dL (ref 0.50–1.35)
Calcium: 9.2 mg/dL (ref 8.4–10.5)
Chloride: 98 mEq/L (ref 96–112)
GFR calc Af Amer: >90 mL/min (ref >90)
GFR, EST NON AFRICAN AMERICAN: 81 mL/min — AB (ref >90)
Glucose, Bld: 156 mg/dL — ABNORMAL HIGH (ref 70–99)
Potassium: 3.7 mEq/L (ref 3.7–5.3)
SODIUM: 141 meq/L (ref 137–147)

## 2013-06-14 NOTE — Patient Instructions (Signed)
[name] [name]  06/14/2013   Your procedure is scheduled on:  06/18/13  Report to [facility] at 10:00 AM.  Call this number if you have problems the morning of surgery: [phone]   Remember:   Do not eat food or drink liquids after midnight.   Take these medicines the morning of surgery with A SIP OF WATER:    Do not wear jewelry, make-up or nail polish.  Do not wear lotions, powders, or perfumes.   Do not shave 48 hours prior to surgery. Men may shave face and neck.  Do not bring valuables to the hospital.  [facility] is not responsible for any belongings or valuables.               Contacts, dentures or bridgework may not be worn into surgery.  Leave suitcase in the car. After surgery it may be brought to your room.  For patients admitted to the hospital, discharge time is determined by your treatment team.               Patients discharged the day of surgery will not be allowed to drive home.    Special Instructions: Shower using CHG 2 nights before surgery and the night before surgery.  If you shower the day of surgery use CHG.  Use special wash - you have one bottle of CHG for all showers.  You should use approximately 1/3 of the bottle for each shower.   Please read over the following fact sheets that you were given: Pain Booklet, Surgical Site Infection Prevention, Anesthesia Post-op Instructions and Care and Recovery After Surgery    Transurethral Resection of the Prostate Transurethral resection of the prostate (TURP) is the removal of part of your prostate to treat noncancerous (benign) prostatic hyperplasia (BPH). BPH typically occurs in men older than 40 years. It is the abnormal growth of cells in your prostate. Specifically, it is an abnormal increase in the number of cells that make up your prostate tissue. This causes an increase in the size of your prostate. Often, in the case of BPH, the prostate becomes so large that it compresses the tube that drains urine out of  your body from your bladder (urethra). Eventually, this compression can obstruct the flow of urine from your bladder. This obstruction can cause recurrent bladder infection and difficulties with bladder control and bladder emptying. The goal of TURP is to remove enough prostate tissue to allow for an unobstructed flow of urine, which often resolves the associated conditions. LET YOUR CAREGIVER KNOW ABOUT:  Any allergies you have.  Any medicines you are taking, including herbs, eye drops, over-the-counter medicines, and creams.  Any problems you have had with the use of anesthetics.  Any blood disorders you have, including bleeding problems and clotting problems.  Previous surgeries you have had.  Any prostate infections you have had. RISKS AND COMPLICATIONS Generally, TURP is a safe procedure. However, as with any surgical procedure, complications can occur. Possible complications associated with TURP include:  Difficulty getting an erection.  Scarring, which may cause problems with the flow of your urine.  Injury to your urethra.  Incontinence from injury to the muscle that surrounds your prostate, which controls urine flow.  Infection.  Bleeding.  Injury to your bladder (rare). BEFORE THE PROCEDURE  Your caregiver will tell you when you need to stop eating and drinking. If you take any medicines, your caregiver will tell you which ones you may keep taking and which ones you  will have to stop taking and when.  Just before the procedure you will also receive medicine to make you fall asleep (general anesthetic). This will be given through a tube that is inserted into one of your veins (intravenous [IV] tube). PROCEDURE Your surgeon inserts an instrument that is similar to a telescope with an electric cutting edge (resectoscope) through your urethra to the area of the prostate gland. The cutting edge is used to remove enlarged pieces of your prostate, one piece at a time. At the end  of your procedure, a flexible tube (catheter) will be inserted into your urethra to drain your bladder. Special plastic bags filled with solution will be connected to the end of the catheter. The solution will be used to irrigate blood from your bladder while you heal.  AFTER THE PROCEDURE You will be taken to the recovery area. Once you are awake, stable, and taking fluids well, you will be taken to your hospital room. Typically, you will stay in the hospital 1 2 days after this procedure. The catheter usually is removed before discharge from the hospital. Document Released: 05/09/2005 Document Revised: 02/01/2012 Document Reviewed: 10/10/2011 [facility] Patient Information 2014 [place], [place].    PATIENT INSTRUCTIONS POST-ANESTHESIA  IMMEDIATELY FOLLOWING SURGERY:  Do not drive or operate machinery for the first twenty four hours after surgery.  Do not make any important decisions for twenty four hours after surgery or while taking narcotic pain medications or sedatives.  If you develop intractable nausea and vomiting or a severe headache please notify your doctor immediately.  FOLLOW-UP:  Please make an appointment with your surgeon as instructed. You do not need to follow up with anesthesia unless specifically instructed to do so.  WOUND CARE INSTRUCTIONS (if applicable):  Keep a dry clean dressing on the anesthesia/puncture wound site if there is drainage.  Once the wound has quit draining you may leave it open to air.  Generally you should leave the bandage intact for twenty four hours unless there is drainage.  If the epidural site drains for more than 36-48 hours please call the anesthesia department.  QUESTIONS?:  Please feel free to call your physician or the hospital operator if you have any questions, and they will be happy to assist you.

## 2013-06-18 ENCOUNTER — Observation Stay (HOSPITAL_COMMUNITY)
Admission: RE | Admit: 2013-06-18 | Discharge: 2013-06-19 | Disposition: A | Discharge status: Home / Self Care | Payer: Medicare Other | Source: Ambulatory Visit | Attending: Urology | Admitting: Urology

## 2013-06-18 ENCOUNTER — Encounter (HOSPITAL_COMMUNITY): Payer: Medicare Other | Admitting: Anesthesiology

## 2013-06-18 ENCOUNTER — Encounter (HOSPITAL_COMMUNITY)
Admission: RE | Disposition: A | Discharge status: Home / Self Care | Payer: Self-pay | Source: Ambulatory Visit | Attending: Urology

## 2013-06-18 ENCOUNTER — Ambulatory Visit (HOSPITAL_COMMUNITY): Payer: Medicare Other | Admitting: Anesthesiology

## 2013-06-18 ENCOUNTER — Encounter (HOSPITAL_COMMUNITY): Payer: Self-pay | Admitting: *Deleted

## 2013-06-18 DIAGNOSIS — N400 Enlarged prostate without lower urinary tract symptoms: Principal | ICD-10-CM | POA: Insufficient documentation

## 2013-06-18 DIAGNOSIS — Z01812 Encounter for preprocedural laboratory examination: Secondary | ICD-10-CM | POA: Diagnosis not present

## 2013-06-18 HISTORY — PX: TRANSURETHRAL RESECTION OF PROSTATE: SHX73

## 2013-06-18 SURGERY — TURP (TRANSURETHRAL RESECTION OF PROSTATE)
Anesthesia: General | Site: Prostate

## 2013-06-18 MED ORDER — CIPROFLOXACIN IN D5W 200 MG/100ML IV SOLN
INTRAVENOUS | Status: AC
  Filled 2013-06-18: qty 100

## 2013-06-18 MED ORDER — ONDANSETRON HCL 4 MG/2ML IJ SOLN
4.0000 mg | Freq: Once | INTRAMUSCULAR | Status: AC
  Administered 2013-06-18: 4 mg via INTRAVENOUS

## 2013-06-18 MED ORDER — VERAPAMIL HCL ER 240 MG PO TBCR
120.0000 mg | EXTENDED_RELEASE_TABLET | Freq: Every day | ORAL | Status: DC
  Administered 2013-06-18: 120 mg via ORAL
  Filled 2013-06-18: qty 1

## 2013-06-18 MED ORDER — SODIUM CHLORIDE BACTERIOSTATIC 0.9 % IJ SOLN
INTRAMUSCULAR | Status: AC
  Filled 2013-06-18: qty 10

## 2013-06-18 MED ORDER — PROPOFOL 10 MG/ML IV BOLUS
INTRAVENOUS | Status: DC | PRN
  Administered 2013-06-18: 150 mg via INTRAVENOUS
  Administered 2013-06-18: 10 mg via INTRAVENOUS

## 2013-06-18 MED ORDER — NEOSTIGMINE METHYLSULFATE 1 MG/ML IJ SOLN
INTRAMUSCULAR | Status: DC | PRN
  Administered 2013-06-18: 3 mg via INTRAVENOUS

## 2013-06-18 MED ORDER — LIDOCAINE HCL (PF) 1 % IJ SOLN
INTRAMUSCULAR | Status: AC
  Filled 2013-06-18: qty 5

## 2013-06-18 MED ORDER — SODIUM CHLORIDE 0.45 % IV SOLN
INTRAVENOUS | Status: DC
  Administered 2013-06-18: 17:00:00 via INTRAVENOUS

## 2013-06-18 MED ORDER — MIDAZOLAM HCL 2 MG/2ML IJ SOLN
1.0000 mg | INTRAMUSCULAR | Status: DC | PRN
  Administered 2013-06-18: 2 mg via INTRAVENOUS

## 2013-06-18 MED ORDER — GLYCOPYRROLATE 0.2 MG/ML IJ SOLN
INTRAMUSCULAR | Status: AC
  Filled 2013-06-18: qty 3

## 2013-06-18 MED ORDER — ATORVASTATIN CALCIUM 40 MG PO TABS
80.0000 mg | ORAL_TABLET | Freq: Every day | ORAL | Status: DC
  Administered 2013-06-18: 80 mg via ORAL
  Filled 2013-06-18: qty 2

## 2013-06-18 MED ORDER — PROPOFOL 10 MG/ML IV BOLUS
INTRAVENOUS | Status: AC
  Filled 2013-06-18: qty 20

## 2013-06-18 MED ORDER — HYDROMORPHONE HCL PF 1 MG/ML IJ SOLN
0.5000 mg | INTRAMUSCULAR | Status: DC | PRN
  Administered 2013-06-18 – 2013-06-19 (×4): 0.5 mg via INTRAVENOUS
  Filled 2013-06-18 (×5): qty 1

## 2013-06-18 MED ORDER — CIPROFLOXACIN IN D5W 200 MG/100ML IV SOLN
200.0000 mg | Freq: Once | INTRAVENOUS | Status: AC
  Administered 2013-06-18: 200 mg via INTRAVENOUS

## 2013-06-18 MED ORDER — HYDRALAZINE HCL 20 MG/ML IJ SOLN
INTRAMUSCULAR | Status: AC
  Filled 2013-06-18: qty 1

## 2013-06-18 MED ORDER — GLYCOPYRROLATE 0.2 MG/ML IJ SOLN
INTRAMUSCULAR | Status: AC
  Filled 2013-06-18: qty 1

## 2013-06-18 MED ORDER — FENTANYL CITRATE 0.05 MG/ML IJ SOLN
INTRAMUSCULAR | Status: AC
  Filled 2013-06-18: qty 2

## 2013-06-18 MED ORDER — SODIUM CHLORIDE 0.9 % IR SOLN
Status: DC | PRN
  Administered 2013-06-18: 3000 mL

## 2013-06-18 MED ORDER — HYDRALAZINE HCL 20 MG/ML IJ SOLN
INTRAMUSCULAR | Status: DC | PRN
  Administered 2013-06-18: 5 mg via INTRAVENOUS

## 2013-06-18 MED ORDER — FENTANYL CITRATE 0.05 MG/ML IJ SOLN
INTRAMUSCULAR | Status: DC | PRN
  Administered 2013-06-18 (×2): 50 ug via INTRAVENOUS
  Administered 2013-06-18: 100 ug via INTRAVENOUS
  Administered 2013-06-18: 50 ug via INTRAVENOUS

## 2013-06-18 MED ORDER — SUCCINYLCHOLINE CHLORIDE 20 MG/ML IJ SOLN
INTRAMUSCULAR | Status: DC | PRN
  Administered 2013-06-18: 120 mg via INTRAVENOUS

## 2013-06-18 MED ORDER — FUROSEMIDE 80 MG PO TABS
80.0000 mg | ORAL_TABLET | Freq: Two times a day (BID) | ORAL | Status: DC
  Administered 2013-06-18 – 2013-06-19 (×3): 80 mg via ORAL
  Filled 2013-06-18 (×3): qty 1

## 2013-06-18 MED ORDER — LACTATED RINGERS IV SOLN
INTRAVENOUS | Status: DC | PRN
  Administered 2013-06-18 (×2): via INTRAVENOUS

## 2013-06-18 MED ORDER — STERILE WATER FOR IRRIGATION IR SOLN
Status: DC | PRN
  Administered 2013-06-18: 1000 mL

## 2013-06-18 MED ORDER — IPRATROPIUM-ALBUTEROL 20-100 MCG/ACT IN AERS
INHALATION_SPRAY | RESPIRATORY_TRACT | Status: DC | PRN
  Administered 2013-06-18: 3 via RESPIRATORY_TRACT

## 2013-06-18 MED ORDER — PHENYLEPHRINE HCL 10 MG/ML IJ SOLN
INTRAMUSCULAR | Status: DC | PRN
  Administered 2013-06-18: 100 ug via INTRAVENOUS

## 2013-06-18 MED ORDER — ROCURONIUM BROMIDE 100 MG/10ML IV SOLN
INTRAVENOUS | Status: DC | PRN
  Administered 2013-06-18: 5 mg via INTRAVENOUS
  Administered 2013-06-18: 25 mg via INTRAVENOUS
  Administered 2013-06-18: 10 mg via INTRAVENOUS

## 2013-06-18 MED ORDER — FENTANYL CITRATE 0.05 MG/ML IJ SOLN
25.0000 ug | INTRAMUSCULAR | Status: AC
  Administered 2013-06-18 (×2): 25 ug via INTRAVENOUS

## 2013-06-18 MED ORDER — ALBUTEROL SULFATE HFA 108 (90 BASE) MCG/ACT IN AERS
INHALATION_SPRAY | RESPIRATORY_TRACT | Status: AC
Start: 2013-06-18 — End: 2013-06-18
  Filled 2013-06-18: qty 6.7

## 2013-06-18 MED ORDER — METOPROLOL SUCCINATE ER 25 MG PO TB24
12.5000 mg | ORAL_TABLET | Freq: Every morning | ORAL | Status: DC
  Administered 2013-06-19: 12.5 mg via ORAL
  Filled 2013-06-18: qty 1

## 2013-06-18 MED ORDER — POTASSIUM CHLORIDE CRYS ER 20 MEQ PO TBCR
20.0000 meq | EXTENDED_RELEASE_TABLET | Freq: Two times a day (BID) | ORAL | Status: DC
  Administered 2013-06-18 – 2013-06-19 (×2): 20 meq via ORAL
  Filled 2013-06-18 (×2): qty 1

## 2013-06-18 MED ORDER — LATANOPROST 0.005 % OP SOLN
1.0000 [drp] | Freq: Every day | OPHTHALMIC | Status: DC
  Administered 2013-06-18: 1 [drp] via OPHTHALMIC
  Filled 2013-06-18: qty 2.5

## 2013-06-18 MED ORDER — MIDAZOLAM HCL 2 MG/2ML IJ SOLN
INTRAMUSCULAR | Status: AC
  Filled 2013-06-18: qty 2

## 2013-06-18 MED ORDER — ONDANSETRON HCL 4 MG/2ML IJ SOLN
INTRAMUSCULAR | Status: AC
  Filled 2013-06-18: qty 2

## 2013-06-18 MED ORDER — ONDANSETRON HCL 4 MG/2ML IJ SOLN
4.0000 mg | Freq: Once | INTRAMUSCULAR | Status: AC | PRN
  Administered 2013-06-18: 4 mg via INTRAVENOUS
  Filled 2013-06-18: qty 2

## 2013-06-18 MED ORDER — GLYCOPYRROLATE 0.2 MG/ML IJ SOLN
INTRAMUSCULAR | Status: DC | PRN
  Administered 2013-06-18: 0.6 mg via INTRAVENOUS

## 2013-06-18 MED ORDER — GLYCINE 1.5 % IR SOLN
Status: DC | PRN
  Administered 2013-06-18 (×6): 3000 mL

## 2013-06-18 MED ORDER — FENTANYL CITRATE 0.05 MG/ML IJ SOLN: 25.0000 ug | INTRAMUSCULAR | Status: DC | PRN

## 2013-06-18 MED ORDER — LACTATED RINGERS IV SOLN
INTRAVENOUS | Status: DC
  Administered 2013-06-18: 1000 mL via INTRAVENOUS

## 2013-06-18 MED ORDER — TAMSULOSIN HCL 0.4 MG PO CAPS
0.4000 mg | ORAL_CAPSULE | Freq: Every day | ORAL | Status: DC
  Administered 2013-06-18 – 2013-06-19 (×2): 0.4 mg via ORAL
  Filled 2013-06-18 (×2): qty 1

## 2013-06-18 MED ORDER — GLYCOPYRROLATE 0.2 MG/ML IJ SOLN
0.2000 mg | Freq: Once | INTRAMUSCULAR | Status: AC
  Administered 2013-06-18: 0.2 mg via INTRAVENOUS

## 2013-06-18 MED ORDER — FENTANYL CITRATE 0.05 MG/ML IJ SOLN
INTRAMUSCULAR | Status: AC
  Filled 2013-06-18: qty 5

## 2013-06-18 MED ORDER — ROCURONIUM BROMIDE 50 MG/5ML IV SOLN
INTRAVENOUS | Status: AC
  Filled 2013-06-18: qty 1

## 2013-06-18 MED ORDER — EPHEDRINE SULFATE 50 MG/ML IJ SOLN
INTRAMUSCULAR | Status: AC
  Filled 2013-06-18: qty 1

## 2013-06-18 MED ORDER — LIDOCAINE HCL (CARDIAC) 20 MG/ML IV SOLN
INTRAVENOUS | Status: DC | PRN
  Administered 2013-06-18: 50 mg via INTRAVENOUS

## 2013-06-18 SURGICAL SUPPLY — 32 items
BAG DECANTER FOR FLEXI CONT (MISCELLANEOUS) ×2 IMPLANT
BAG DRAIN URO TABLE W/ADPT NS (DRAPE) ×2 IMPLANT
BAG DRN 8 ADPR NS SKTRN CSTL (DRAPE) ×1
BAG DRN URN TUBE DRIP CHMBR (OSTOMY) ×1
BAG URINE DRAIN TURP 4L (OSTOMY) ×2 IMPLANT
CABLE HI FREQUENCY MONOPOLAR (ELECTROSURGICAL) ×2 IMPLANT
CATH FOLEY 3WAY 30CC 22F (CATHETERS) ×2 IMPLANT
CLOTH BEACON ORANGE TIMEOUT ST (SAFETY) ×2 IMPLANT
CONNECTOR 5 IN 1 STRAIGHT STRL (MISCELLANEOUS) ×2 IMPLANT
DRAPE STERI URO 23X35 APER SZ5 (DRAPE) ×2 IMPLANT
DRAPE WARM FLUID 44X44 (DRAPE) ×2 IMPLANT
ELECT CUT LOOP C-MAX 27FR .012 (CUTTING LOOP) ×2
ELECT REM PT RETURN 9FT ADLT (ELECTROSURGICAL) ×2
ELECTRODE CUT LP CMX 27FR .012 (CUTTING LOOP) IMPLANT
ELECTRODE REM PT RTRN 9FT ADLT (ELECTROSURGICAL) ×1 IMPLANT
FLOOR PAD 36X40 (MISCELLANEOUS) ×2
FORMALIN 10 PREFIL 480ML (MISCELLANEOUS) ×2 IMPLANT
GLOVE BIO SURGEON STRL SZ7 (GLOVE) ×2 IMPLANT
GLYCINE 1.5% IRRIG UROMATIC (IV SOLUTION) ×10 IMPLANT
GOWN STRL REUS W/TWL LRG LVL3 (GOWN DISPOSABLE) ×4 IMPLANT
IV NS IRRIG 3000ML ARTHROMATIC (IV SOLUTION) ×2 IMPLANT
KIT ROOM TURNOVER AP CYSTO (KITS) ×2 IMPLANT
MANIFOLD NEPTUNE II (INSTRUMENTS) ×2 IMPLANT
PACK CYSTO (CUSTOM PROCEDURE TRAY) ×2 IMPLANT
PAD ARMBOARD 7.5X6 YLW CONV (MISCELLANEOUS) ×2 IMPLANT
PAD FLOOR 36X40 (MISCELLANEOUS) ×1 IMPLANT
SET IRRIGATING DISP (SET/KITS/TRAYS/PACK) ×2 IMPLANT
SYR 30ML LL (SYRINGE) ×2 IMPLANT
TOWEL OR 17X26 4PK STRL BLUE (TOWEL DISPOSABLE) ×2 IMPLANT
WATER STERILE IRR 1000ML POUR (IV SOLUTION) ×2 IMPLANT
XPEEDA 550 SIDEFIRING FIBER (MISCELLANEOUS) IMPLANT
YANKAUER SUCT BULB TIP 10FT TU (MISCELLANEOUS) ×2 IMPLANT

## 2013-06-18 NOTE — Brief Op Note (Signed)
06/18/2013  1:17 PM  PATIENT:  Earl Lites  73 y.o. male  PRE-OPERATIVE DIAGNOSIS:  bph  POST-OPERATIVE DIAGNOSIS:  bph  PROCEDURE:  Procedure(s): TRANSURETHRAL RESECTION OF THE PROSTATE (TURP) (N/A)  SURGEON:  Surgeon(s) and Role:    * Marissa Nestle, MD - Primary  PHYSICIAN ASSISTANT:   ASSISTANTS: none   ANESTHESIA:   general  EBL:     BLOOD ADMINISTERED:none  DRAINS: Urinary Catheter (Foley) and Urinary Catheter (Suprapubic)   LOCAL MEDICATIONS USED:  Amount: ml and NONE  SPECIMEN:  Source of Specimen:  prostate chips  DISPOSITION OF SPECIMEN:  PATHOLOGY  COUNTS:  YES  TOURNIQUET:  * No tourniquets in log *  DICTATION: .Other Dictation: Dictation Number [id number]  PLAN OF CARE: Admit for overnight observation  PATIENT DISPOSITION:  PACU - hemodynamically stable.   Delay start of Pharmacological VTE agent (>24hrs) due to surgical blood loss or risk of bleeding:

## 2013-06-18 NOTE — H&P (Signed)
NAME:  [name], [name]             ACCOUNT NO.:  [id number]  MEDICAL RECORD NO.:  [id number]  LOCATION:  PERIO                         FACILITY:  [facility]  PHYSICIAN:  Marissa Nestle, M.D.DATE OF BIRTH:  May 31, 1940  DATE OF ADMISSION:  06/13/2013 DATE OF DISCHARGE:  LH                             HISTORY & PHYSICAL   CHIEF COMPLAINT:  Symptoms of prostatism.  HISTORY:  A 73 year old gentleman being followed by me since August 2011.  He was in [facility] at that time.  He had symptoms of prostatism so I was involved to see him.  He also has positive urine culture at that time, which was treated with Zosyn.  He could not tolerate Zosyn at that time.  A workup with cystoscopy showed that he has BPH with bladder neck obstruction, so I put him on Flomax with good results.  His residual urine kept decreasing until it stabilized at 40- 50 mL recently.  He says he has been using Flomax but continued to be symptomatic with frequency slow and weak stream, so I told him that probably we need to do a TUR prostate.  I send him to have medical clearance by the cardiologist recently.  Note that he is cleared from a cardiac standpoint.  In 2011, he was also having left testicular pain, and I diagnosed with epididymitis on that side.  His ultrasound was negative,  otherwise.  He was treated with antibiotics.  His PSA was also 9.6, but he had positive urine culture at that time, it has come down since then.  His AUA score was 11 but cystoscopy showed that he has significant enlarged prostate with trabeculated bladder, so I advised him to undergo TUR prostate or use Flomax.  He elected to use Flomax. He has been using Flomax ever since.  He also is not having any fever or chills.  His PSA has come down to 2.4 now which was done on May 30, 2013.  He is coming as outpatient to undergo TUR prostate under anesthesia tomorrow, will be kept overnight in the hospital, then he will be sent home  with the catheter which I will take it out in the office.  Subsequently, his residual urine is about 40 mL now.  He does have a history of having hypertension for which he takes medicines.  No history of diabetes.  He does have a history of having pain in both knees for which he is getting steroid injections with some relief.  He denies any chest pain, shortness of breath, orthopnea, PND, nausea, vomiting.  He also had surgery of laminectomy in the lumbar region, has a history of having chronic pain syndrome.  His systematics was completely normal, although it was done before he had laminectomy.  PAST MEDICAL HISTORY:  Negative.  FAMILY HISTORY:  Unremarkable.  No history of prostate cancer.  PERSONAL HISTORY:  Does not smoke or drink.  REVIEW OF SYSTEMS:  Unremarkable.  PHYSICAL EXAMINATION:  VITAL SIGNS:  Blood pressure is 140/80, temperature is normal. CENTRAL NERVOUS SYSTEM:  No gross neurological deficits. HEAD, NECK, EYE ENT:  Negative. CHEST:  Symmetrical.  Normal breath sounds. HEART:  Regular sinus rhythm.  No murmur.  ABDOMEN:  Soft, flat.  Liver, spleen, kidneys not palpable. EXTREMITIES:  There is 1+ pitting edema of the lower extremities. RECTAL:  Normal sphincter tone.  No rectal mass.  Prostate is about 30 g, smooth and firm.  PELVIC:  External genitalia is circumcised.  Meatus adequate.  Testicles are normal.  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  PLAN:  TUR prostate under anesthesia, then keep him overnight in the hospital.     Marissa Nestle, M.D.     MIJ/MEDQ  D:  06/17/2013  T:  06/18/2013  Job:  [id number]

## 2013-06-18 NOTE — Anesthesia Preprocedure Evaluation (Signed)
Anesthesia Evaluation  Patient identified by MRN, date of birth, ID band Patient awake    Reviewed: Allergy & Precautions, H&P , NPO status , Patient's Chart, lab work & pertinent test results, reviewed documented beta blocker date and time   History of Anesthesia Complications Negative for: history of anesthetic complications  Airway Mallampati: II TM Distance: <3 FB Neck ROM: Full    Dental  (+) Teeth Intact and Dental Advisory Given   Pulmonary sleep apnea and Oxygen sleep apnea , former smoker,  Possible hypoventilation syndrome issues after last anesthetic  breath sounds clear to auscultation        Cardiovascular Exercise Tolerance: Poor hypertension, Pt. on medications and Pt. on home beta blockers + CAD and +CHF (with BLE edema) Rhythm:Regular Rate:Normal  Echo on 10/02/08 showed: Left ventricle: The cavity size was normal. There was moderate concentric hypertrophy. Systolic function was normal. The estimated ejection fraction was in the range of 60% to 65%. Wall motion was normal; there were no regional wall motion abnormalities. Aortic valve: Trileaflet; normal thickness leaflets. Mobility was not restricted. Doppler: Transvalvular velocity was within the normal range. There was no stenosis. No regurgitation. Aorta: Aortic root: The aortic root was normal in size. Mitral valve: Structurally normal valve. Mobility was not restricted. Doppler: Transvalvular velocity was within the normal range. There was no evidence for stenosis. No regurgitation. Peak gradient: 100mm Hg (D). Left atrium: The atrium was normal in size. Right ventricle: The cavity size was normal. Wall thickness was normal. Systolic function was normal. Pulmonic valve: Doppler: Transvalvular velocity was within the normal range. There was no evidence for stenosis. No significant regurgitation. Tricuspid valve: Structurally normal valve. Doppler: Transvalvular velocity  was within the normal range. Trivial regurgitation. Pulmonary artery: The main pulmonary artery was normal-sized. Systolic pressure was within the normal range. Right atrium: The atrium was normal in size. Pericardium: There was no pericardial effusion. Inferior vena cava: The vessel was normal in size.   He had a nuclear stress test on 01/10/07 at [facility] that showed evidence of mild ischemia and mid inferior and mid inferior lateral regions. The post stress EF was 62%. No significant wall motion abnormalities noted. Ventricular function is preserved. EKG was negative for ischemia. Impression ultimately reports this as a low risk scan.  However, he did ultimately undergo a cardiac cath on 02/27/07 that showed: No significant coronary artery disease by cardiac catheterization. There is left ventricular hypertrophy noted on left ventriculogram. Moderate pulmonary hypertension with preserved cardiac output and cardiac index.    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 )    GI/Hepatic Neg liver ROS, PUD, GERD-  Medicated and Controlled,  Endo/Other  Hypothyroidism Morbid obesity  Renal/GU Renal InsufficiencyRenal disease     Musculoskeletal   Abdominal (+) + obese,   Peds  Hematology  (+) Blood dyscrasia (thrombocytopenia), ,   Anesthesia Other Findings   Reproductive/Obstetrics                           Anesthesia Physical Anesthesia Plan  ASA: III  Anesthesia Plan: General   Post-op Pain Management:    Induction: Intravenous, Rapid sequence and Cricoid pressure planned  Airway Management Planned: Oral ETT  Additional Equipment:   Intra-op Plan:   Post-operative Plan: Extubation in OR  Informed Consent: I have reviewed the patients History and Physical, chart, labs and discussed the procedure including the risks, benefits and alternatives for the  proposed anesthesia with the patient or authorized  representative who has indicated his/her understanding and acceptance.     Plan Discussed with:   Anesthesia Plan Comments:         Anesthesia Quick Evaluation

## 2013-06-18 NOTE — Progress Notes (Signed)
No change in H&P on reexamination. 

## 2013-06-18 NOTE — Interval H&P Note (Signed)
History and Physical Interval Note:  06/18/2013 12:15 PM  [name] [name]  has presented today for surgery, with the diagnosis of bph  The various methods of treatment have been discussed with the patient and family. After consideration of risks, benefits and other options for treatment, the patient has consented to  Procedure(s): TRANSURETHRAL RESECTION OF THE PROSTATE (TURP) (N/A) as a surgical intervention .  The patient's history has been reviewed, patient examined, no change in status, stable for surgery.  I have reviewed the patient's chart and labs.  Questions were answered to the patient's satisfaction.     Liberta Gimpel,MOHAMMAD I

## 2013-06-18 NOTE — Anesthesia Postprocedure Evaluation (Signed)
  Anesthesia Post-op Note  Patient: [name] [name]  Procedure(s) Performed: Procedure(s): TRANSURETHRAL RESECTION OF THE PROSTATE (TURP) (N/A)  Patient Location: PACU  Anesthesia Type:General  Level of Consciousness: sedated and patient cooperative  Airway and Oxygen Therapy: Patient Spontanous Breathing and Patient connected to face mask oxygen  Post-op Pain: none  Post-op Assessment: Post-op Vital signs reviewed, Patient's Cardiovascular Status Stable, Respiratory Function Stable, Patent Airway, No signs of Nausea or vomiting and Pain level controlled  Post-op Vital Signs: Reviewed and stable  Complications: No apparent anesthesia complications

## 2013-06-18 NOTE — Anesthesia Procedure Notes (Signed)
Procedure Name: Intubation Date/Time: 06/18/2013 12:29 PM Performed by: Andree Elk, Azyria Osmon A Pre-anesthesia Checklist: Patient identified, Patient being monitored, Timeout performed, Emergency Drugs available and Suction available Patient Re-evaluated:Patient Re-evaluated prior to inductionOxygen Delivery Method: Circle System Utilized Preoxygenation: Pre-oxygenation with 100% oxygen Intubation Type: IV induction, Rapid sequence and Cricoid Pressure applied Laryngoscope Size: 3 and Miller Grade View: Grade I Tube type: Oral Tube size: 7.0 mm Number of attempts: 1 Airway Equipment and Method: stylet Placement Confirmation: ETT inserted through vocal cords under direct vision,  positive ETCO2 and breath sounds checked- equal and bilateral Secured at: 21 cm Tube secured with: Tape Dental Injury: Teeth and Oropharynx as per pre-operative assessment

## 2013-06-18 NOTE — Transfer of Care (Signed)
Immediate Anesthesia Transfer of Care Note  Patient: [name] [name]  Procedure(s) Performed: Procedure(s): TRANSURETHRAL RESECTION OF THE PROSTATE (TURP) (N/A)  Patient Location: PACU  Anesthesia Type:General  Level of Consciousness: sedated and patient cooperative  Airway & Oxygen Therapy: Patient Spontanous Breathing and Patient connected to face mask oxygen  Post-op Assessment: Report given to PACU RN and Post -op Vital signs reviewed and stable  Post vital signs: Reviewed and stable  Complications: No apparent anesthesia complications

## 2013-06-19 ENCOUNTER — Encounter (HOSPITAL_COMMUNITY): Payer: Self-pay | Admitting: Urology

## 2013-06-19 DIAGNOSIS — N400 Enlarged prostate without lower urinary tract symptoms: Secondary | ICD-10-CM | POA: Diagnosis not present

## 2013-06-19 DIAGNOSIS — Z01812 Encounter for preprocedural laboratory examination: Secondary | ICD-10-CM | POA: Diagnosis not present

## 2013-06-19 LAB — CBC
HCT: 41.1 % (ref 39.0–52.0)
HEMOGLOBIN: 13.5 g/dL (ref 13.0–17.0)
MCH: 26.5 pg (ref 26.0–34.0)
MCHC: 32.8 g/dL (ref 30.0–36.0)
MCV: 80.7 fL (ref 78.0–100.0)
Platelets: 121 10*3/uL — ABNORMAL LOW (ref 150–400)
RBC: 5.09 MIL/uL (ref 4.22–5.81)
RDW: 17.8 % — ABNORMAL HIGH (ref 11.5–15.5)
WBC: 13.1 10*3/uL — ABNORMAL HIGH (ref 4.0–10.5)

## 2013-06-19 LAB — BASIC METABOLIC PANEL
BUN: 10 mg/dL (ref 6–23)
CALCIUM: 9.1 mg/dL (ref 8.4–10.5)
CO2: 32 meq/L (ref 19–32)
Chloride: 101 mEq/L (ref 96–112)
Creatinine, Ser: 0.93 mg/dL (ref 0.50–1.35)
GFR calc Af Amer: >90 mL/min (ref >90)
GFR, EST NON AFRICAN AMERICAN: 81 mL/min — AB (ref >90)
GLUCOSE: 133 mg/dL — AB (ref 70–99)
Potassium: 4 mEq/L (ref 3.7–5.3)
Sodium: 140 mEq/L (ref 137–147)

## 2013-06-19 MED ORDER — SODIUM CHLORIDE 0.9 % IR SOLN
3000.0000 mL | Status: DC
  Administered 2013-06-18 – 2013-06-19 (×12): 3000 mL

## 2013-06-19 NOTE — Care Management Note (Signed)
    Page 1 of 1   06/19/2013     1:38:56 PM   CARE MANAGEMENT NOTE 06/19/2013  Patient:  [name] [name], [name] [name]   Account Number:  [id number]  Date Initiated:  06/19/2013  Documentation initiated by:  Claretha Cooper  Subjective/Objective Assessment:   Pt from home where he lives with his wife. States he had the foley in when he was admitted and did not need HH at DC. Pt inquired regarding which office to f/u with [place], [place] or [place]. Janan Ridge RN in room and stated she would     Action/Plan:   advise pt. No HH / DME needs identified.   Anticipated DC Date:  06/19/2013   Anticipated DC Plan:  [place]  CM consult      Choice offered to / List presented to:             Status of service:  Completed, signed off Medicare Important Message given?   (If response is "NO", the following Medicare IM given date fields will be blank) Date Medicare IM given:   Date Additional Medicare IM given:    Discharge Disposition:    Per UR Regulation:    If discussed at Long Length of Stay Meetings, dates discussed:    Comments:  06/19/13 Paulena Servais Dellia Nims RN BSN CM

## 2013-06-19 NOTE — Anesthesia Postprocedure Evaluation (Signed)
Anesthesia Post Note  Patient: [name] [name]  Procedure(s) Performed: Procedure(s) (LRB): TRANSURETHRAL RESECTION OF THE PROSTATE (TURP) (N/A)  Anesthesia type: General  Patient location: 325  Post pain: Pain level controlled  Post assessment: Post-op Vital signs reviewed, Patient's Cardiovascular Status Stable, Respiratory Function Stable, Patent Airway, No signs of Nausea or vomiting and Pain level controlled  Last Vitals:  Filed Vitals:   06/19/13 1119  BP: 127/77  Pulse: 81  Temp: 36.7 C  Resp: 18    Post vital signs: Reviewed and stable  Level of consciousness: awake and alert   Complications: No apparent anesthesia complications

## 2013-06-19 NOTE — Progress Notes (Signed)
Pt. And family member received discharge instructions and was able to teach back.  Foley catheter remained in place and leg bag was applied.  Patient's IV was removed.  Pt. Escorted to vehicle via wheelchair by nurse tech.  Domenick Quebedeaux R

## 2013-06-19 NOTE — Op Note (Signed)
NAME:  [name] [name], [name] [name]             ACCOUNT NO.:  [id number]  MEDICAL RECORD NO.:  [id number]  LOCATION:  A325                          FACILITY:  [facility]  PHYSICIAN:  Marissa Nestle, M.D.DATE OF BIRTH:  Oct 12, 1940  DATE OF PROCEDURE:  06/18/2013 DATE OF DISCHARGE:                              OPERATIVE REPORT   PREOPERATIVE DIAGNOSIS:  Benign prostatic hypertrophy.  POSTOPERATIVE DIAGNOSIS:  Benign prostatic hypertrophy.  PROCEDURE:  Transurethral resection of the prostate.  ANESTHESIA:  General.  PROCEDURE:  The patient under general anesthesia in lithotomy position. After usual prep and drape, a #28 Iglesias resectoscope was introduced into the bladder.  It was inspected again.  He has only lateral lobe hypertrophy.  Resectoscope was pulled back in mid prostatic urethra. After resecting the bladder neck circumferentially, the bleeders were coagulated.  Resectoscope was pulled back at the level of the verumontanum, rotated to 11 o'clock position.  Resection of the right lobe was done completely between 11 and 7 o'clock positions.  Similarly, the left lobe was resected completely between 1 and 5 o'clock positions. Most of the adenoma is out.  There was a small tissue in the anterior midline and the posterior midline, which was resected.  Next, after resecting the posterior midline tissue along with the apical tissue, prostatic urethra looks wide open.  No visual obstruction.  The chips were evacuated.  Bleeders were coagulated.  At this point, the resectoscope was removed.  22 three-way Foley catheter left in for drainage.  CBI started, which is clear.  The patient left the operating room in satisfactory condition.     Marissa Nestle, M.D.     MIJ/MEDQ  D:  06/18/2013  T:  06/19/2013  Job:  [id number]

## 2013-06-19 NOTE — Progress Notes (Signed)
UR completed 

## 2013-06-21 ENCOUNTER — Encounter: Payer: Self-pay | Admitting: Internal Medicine

## 2013-07-04 DIAGNOSIS — H538 Other visual disturbances: Secondary | ICD-10-CM | POA: Diagnosis not present

## 2013-07-04 DIAGNOSIS — H4010X0 Unspecified open-angle glaucoma, stage unspecified: Secondary | ICD-10-CM | POA: Diagnosis not present

## 2013-07-04 DIAGNOSIS — H2510 Age-related nuclear cataract, unspecified eye: Secondary | ICD-10-CM | POA: Diagnosis not present

## 2013-07-05 ENCOUNTER — Encounter (HOSPITAL_COMMUNITY): Payer: Medicare Other | Attending: Internal Medicine

## 2013-07-05 DIAGNOSIS — D696 Thrombocytopenia, unspecified: Secondary | ICD-10-CM | POA: Diagnosis not present

## 2013-07-05 DIAGNOSIS — K625 Hemorrhage of anus and rectum: Secondary | ICD-10-CM | POA: Insufficient documentation

## 2013-07-05 LAB — COMPREHENSIVE METABOLIC PANEL
ALT: 14 U/L (ref 0–53)
AST: 15 U/L (ref 0–37)
Albumin: 3 g/dL — ABNORMAL LOW (ref 3.5–5.2)
Alkaline Phosphatase: 86 U/L (ref 39–117)
BUN: 11 mg/dL (ref 6–23)
CALCIUM: 9.3 mg/dL (ref 8.4–10.5)
CO2: 31 mEq/L (ref 19–32)
CREATININE: 0.81 mg/dL (ref 0.50–1.35)
Chloride: 103 mEq/L (ref 96–112)
GFR calc Af Amer: >90 mL/min (ref >90)
GFR calc non Af Amer: 86 mL/min — ABNORMAL LOW (ref >90)
Glucose, Bld: 147 mg/dL — ABNORMAL HIGH (ref 70–99)
Potassium: 4.1 mEq/L (ref 3.7–5.3)
SODIUM: 140 meq/L (ref 137–147)
TOTAL PROTEIN: 6.6 g/dL (ref 6.0–8.3)
Total Bilirubin: 0.5 mg/dL (ref 0.3–1.2)

## 2013-07-05 LAB — CBC
HCT: 42.2 % (ref 39.0–52.0)
Hemoglobin: 13.9 g/dL (ref 13.0–17.0)
MCH: 26.5 pg (ref 26.0–34.0)
MCHC: 32.9 g/dL (ref 30.0–36.0)
MCV: 80.4 fL (ref 78.0–100.0)
PLATELETS: 143 10*3/uL — AB (ref 150–400)
RBC: 5.25 MIL/uL (ref 4.22–5.81)
RDW: 17.5 % — AB (ref 11.5–15.5)
WBC: 12.3 10*3/uL — ABNORMAL HIGH (ref 4.0–10.5)

## 2013-07-05 NOTE — Progress Notes (Signed)
Labs drawn today for cbc,cmp

## 2013-07-09 ENCOUNTER — Ambulatory Visit (HOSPITAL_COMMUNITY): Payer: Medicare Other | Admitting: Oncology

## 2013-07-11 NOTE — Discharge Summary (Signed)
NAME:  [name], [name]             ACCOUNT NO.:  [id number]  MEDICAL RECORD NO.:  [id number]  LOCATION:                                 FACILITY:  PHYSICIAN:  Marissa Nestle, M.D.DATE OF BIRTH:  11-07-1940  DATE OF ADMISSION:  06/18/2013 DATE OF DISCHARGE:  01/28/2015LH                              DISCHARGE SUMMARY   [name] who has symptoms of prostatism.  He was in [facility] few months ago with urinary tract infection.  He had workup done at that time.  He was found to have BPH with bladder neck obstruction, so I put him on Flomax.  He was sent home.  He was followed up in the office.  His urinary symptoms improved, but his residual urine continued to 40-50 mL.  He continued to be symptomatic in spite of the reason that he was taking Flomax, so I advised him to undergo TUR prostate after medical clearance.  He was cleared medically, and was advised to undergo TUR prostate.  He was taken to the operating room as outpatient and TUR prostate was done, admitted overnight in the hospital.  Postop course was benign.  Next day, his urine was clear, so I sent him home with Foley catheter, and he came back in the office and his pathology report shows only BPH, no cancer of the prostate, so he is back to normal.  As I said, his urine was clear, so he was sent home with Foley catheter.  The pathology report was not back in the hospital at that time.  He is to continue to use his usual followup medications at home.  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  DISCHARGE CONDITION:  Improved.  I told him that he needs to continue his usual medicines.  I did not give him any other medicines, and I will see him back in couple of days in the office to remove his Foley catheter.     Marissa Nestle, M.D.     MIJ/MEDQ  D:  07/10/2013  T:  07/10/2013  Job:  [id number]

## 2013-07-16 NOTE — Progress Notes (Signed)
[name] [name]., MD [address] [place] [address]  Thrombocytopenia - Plan: CBC with Differential, Cardiolipin antibodies, IgG, IgM, IgA  Bright red blood per rectum - Plan: Iron and TIBC, Ferritin  Rectal bleeding - Plan: Iron and TIBC, Ferritin  CURRENT THERAPY:  INTERVAL HISTORY: [name] [name] 73 y.o. male returns for  regular  visit for followup of waxing and waning thrombocytopenia, stable AND Mild leukocytosis, stable  Chart is reviewed.  [name] [name] underwent a TURP by Dr. Michela Pitcher on 06/18/2013.  He tolerated this procedure well according to chart review.   I personally reviewed and went over laboratory results with the patient.  The results are noted within this dictation.  I personally reviewed and went over radiographic studies with the patient.  The results are noted within this dictation.    CT of abd/pelvis was benign in Jan 2015.   I reviewed the patient's chart back to at least 2012. It is noted that his white blood cell count and platelet count is very stable dating back to 2012. Since 2012, his white blood cell count has remained within a stable range not exceeding 20.4. His white blood cell count has very stable. In addition, his platelet count has remained stable ranging between 82-156. Again, his platelet count is very safe period  I reviewed the aforementioned formation with the patient.  He feels well and denies any recent infections. He denies any spontaneous bleeding, blood in stool, black tarry stool, hemoptysis, easy bruisability.  Additionally, he denies any B. symptoms including fevers, chills, night sweats, unintentional weight loss, and early satiety.  With this information, we will perform laboratory work in 1 year and see him back in one year. He is to continue followup with his primary care physician as directed. He will contact us and we will see her much sooner if necessary if he develops any symptoms or worrisome  findings. I reviewed signs and symptoms of low platelet count, and elevated white blood cell count.  Hematologically, the patient denies any complaints and ROS questioning is negative. If his white blood cell count were abnormally change or is a counselor to abnormally change, I will work this up more intensively. Without evidence of this, we will just monitor.   Past Medical History  Diagnosis Date  . CHF (congestive heart failure)   . Obesity   . Hyperlipidemia   . PUD (peptic ulcer disease)   . GERD (gastroesophageal reflux disease)   . S/P endoscopy 2007, 2012    2007: nl, May 2012: antral erosions  . Osteoarthritis   . S/P colonoscopy 2007, Feb and March 2012    hx of adenomas, left-sided diverticula, On 07/2010 TCS, fresh blood and clot noted coming from TI.  Marland Kitchen Anxiety   . Thyroid disease     hypothyroid  . Ringing in ears   . History of kidney stones   . History of bladder infections   . Heel spur     right heel  . Collagen vascular disease     history of venous insufficency and LE cellulitis  . Thrombocytopenia 12/30/2011    Stable  . BPH (benign prostatic hyperplasia)   . Hypertension     Sees Dr. Debara Pickett  . Bilateral lower leg cellulitis   . Sleep apnea     uses oxygen 2L via [place] cannot tolerate CPAP  . Complication of anesthesia     due to sleep apnea pt has had difficulty being put under  anesthesia  . PONV (postoperative nausea and vomiting)     has HYPERTENSION; GASTROESOPHAGEAL REFLUX DISEASE, CHRONIC; FATTY LIVER DISEASE; CHOLELITHIASIS, SYMPTOMATIC; RENAL INSUFFICIENCY; ABDOMINAL BLOATING; COLONIC POLYPS, ADENOMATOUS, HX OF; NAUSEA; DIARRHEA; Rectal bleeding; Constipation; Hypothyroidism; Upper abdominal pain; Anorexia; Bowel habit changes; Thrombocytopenia; SIRS (systemic inflammatory response syndrome); UTI (lower urinary tract infection); BPH (benign prostatic hyperplasia); Obesity; Hyperlipidemia; Constipation; Edema; Cellulitis; Bright red blood per rectum;  Other spondylosis with radiculopathy, lumbar region; Right heart failure; OSA (obstructive sleep apnea); Pre-operative cardiovascular examination; and CAD, minor disease, 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     is allergic to aspirin.  [name] [name] does not currently have medications on file.  Past Surgical History  Procedure Laterality Date  . Knee arthroscopy    . Rotator cuff repair    . Cholecystectomy    . Lumbar spine surgery    . Small bowel capsule study  07/2011    ?transient focal ischemia in distal small bowel to explain GI bleeding  . Esophagogastroduodenoscopy  Aug 2013    [facility], single 78mm pedunculated polyp otherwise normal. HYPERPLASTIC, NEGATIVE h.pylori  . Cardiac catheterization      02/27/07: no sign CAD, LVH, mod pulm HTN  . Transurethral resection of prostate    . Transurethral resection of prostate N/A 06/18/2013    Procedure: TRANSURETHRAL RESECTION OF THE PROSTATE (TURP);  Surgeon: Marissa Nestle, MD;  Location: [facility];  Service: Urology;  Laterality: N/A;    Denies any headaches, dizziness, double vision, fevers, chills, night sweats, nausea, vomiting, diarrhea, constipation, chest pain, heart palpitations, shortness of breath, blood in stool, black tarry stool, urinary pain, urinary burning, urinary frequency, hematuria.   PHYSICAL EXAMINATION  ECOG PERFORMANCE STATUS: 0 - Asymptomatic  Filed Vitals:   07/17/13 1043  BP: 113/76  Pulse: 98  Temp: 97.3 F (36.3 C)  Resp: 20    GENERAL:alert, no distress, well nourished, well developed, comfortable, cooperative, obese and smiling SKIN: skin color, texture, turgor are normal, no rashes or significant lesions HEAD: Normocephalic, No masses, lesions, tenderness or abnormalities EYES: normal, PERRLA, EOMI, Conjunctiva are pink and non-injected EARS: External ears normal OROPHARYNX:mucous membranes are moist  NECK: supple, trachea midline LYMPH:  no palpable lymphadenopathy BREAST:not examined LUNGS:  clear to auscultation  HEART: regular rate & rhythm, no murmurs and no gallops ABDOMEN:abdomen soft, non-tender, obese and normal bowel sounds BACK: Back symmetric, no curvature. EXTREMITIES:less then 2 second capillary refill, no joint deformities, effusion, or inflammation, no skin discoloration, no clubbing, no cyanosis  NEURO: alert & oriented x 3 with fluent speech, no focal motor/sensory deficits, gait normal    LABORATORY DATA: CBC    Component Value Date/Time   WBC 12.3* 07/05/2013 0945   RBC 5.25 07/05/2013 0945   HGB 13.9 07/05/2013 0945   HCT 42.2 07/05/2013 0945   PLT 143* 07/05/2013 0945   MCV 80.4 07/05/2013 0945   MCH 26.5 07/05/2013 0945   MCHC 32.9 07/05/2013 0945   RDW 17.5* 07/05/2013 0945   LYMPHSABS 1.1 06/09/2013 1438   MONOABS 1.2* 06/09/2013 1438   EOSABS 0.1 06/09/2013 1438   BASOSABS 0.1 06/09/2013 1438    Results for [name] [name], [name] [name] (MRN [id number]) as of 07/16/2013 15:22  Ref. Range 07/02/2012 09:16 08/21/2012 13:28 09/02/2012 10:44 09/03/2012 05:44 12/28/2012 10:01 03/04/2013 10:05 05/03/2013 18:20 05/04/2013 13:05 05/25/2013 12:30 06/09/2013 14:38 06/14/2013 14:20 06/19/2013 06:45 07/05/2013 09:45  WBC Latest Range: 4.0-10.5 K/uL 16.2 (H) 12.5 (H) 20.4 (H) 17.4 (H) 9.6 10.8 (H) 14.8 (H) 14.0 (H)  13.5 (H) 13.0 (H)  13.1 (H) 12.3 (H)   Results for [name] [name], [name] [name] (MRN [id number]) as of 07/16/2013 15:22  Ref. Range 07/02/2012 09:16 08/21/2012 13:28 09/02/2012 10:44 09/03/2012 05:44 12/28/2012 10:01 03/04/2013 10:05 05/03/2013 18:20 05/04/2013 13:05 05/25/2013 12:30 06/09/2013 14:38 06/14/2013 14:20 06/19/2013 06:45 07/05/2013 09:45  Platelets Latest Range: 150-400 K/uL 159 97 (L) 155 157 116 (L) 122 (L) 146 (L) 143 (L) 117 (L) 120 (L)  121 (L) 143 (L)     RADIOGRAPHIC STUDIES:  06/09/2013  CLINICAL DATA: Gross hematuria. History of kidney stones.  EXAM:  CT ABDOMEN AND PELVIS WITHOUT CONTRAST  TECHNIQUE:  Multidetector CT imaging of the abdomen and pelvis was performed  following the  standard protocol without intravenous contrast.  COMPARISON: View abdomen or/16/14. CT abdomen and pelvis  02/27/2012.  FINDINGS:  Mild dependent atelectasis or scarring is evident. No focal nodule,  mass, or airspace disease is present. The heart size is normal. No  significant pleural or pericardial effusion is evident.  The liver and spleen are within normal limits. The stomach,  duodenum, and pancreas are unremarkable. The common bile duct is  within normal limits following cholecystectomy. The adrenal glands  are normal bilaterally. The kidneys lie somewhat low, without  change. Multiple low-density lesions were previously investigated by  ultrasound and found to be cysts. These are unchanged.  There is no evidence for nephrolithiasis or hydronephrosis. Mild  stranding about both kidneys is similar to the prior exam.  Minimal atherosclerotic calcifications are present in the abdominal  aorta without aneurysm. Rectosigmoid colon is within normal limits.  The remainder of the colon is unremarkable. The appendix is  visualized and normal. The small bowel is within normal limits. A  Foley catheter is present within the urinary bladder. There is fluid  or soft tissue within the right inguinal canal.  The bone windows demonstrate stable fusion at L4-5. No focal lytic  or blastic lesions are present. Anterior osteophytes are fused in  the lower thoracic spine.  IMPRESSION:  1. No evidence for nephrolithiasis or urinary obstruction.  2. Low-density lesions in both kidneys are stable, likely cysts.  3. Status post cholecystectomy and lumbar fusion.  Electronically Signed  By: Lawrence Santiago M.D.  On: 06/09/2013 16:40    ASSESSMENT:  1. Thrombocytopenia, stable.  Waxing and waning 2. Leukocytosis, stable  Patient Active Problem List   Diagnosis Date Noted  . Pre-operative cardiovascular examination 06/12/2013  . 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 Right heart failure  11/27/2012  . OSA (obstructive sleep apnea) 11/27/2012  . Other spondylosis with radiculopathy, lumbar region 08/22/2012  . Bright red blood per rectum 06/15/2012  . Cellulitis 06/14/2012  . Constipation 02/29/2012  . Edema 02/29/2012  . SIRS (systemic inflammatory response syndrome) 02/27/2012  . UTI (lower urinary tract infection) 02/27/2012  . BPH (benign prostatic hyperplasia) 02/27/2012  . Obesity 02/27/2012  . Hyperlipidemia 02/27/2012  . Thrombocytopenia 12/30/2011  . Hypothyroidism 10/27/2011  . Upper abdominal pain 10/27/2011  . Anorexia 10/27/2011  . Bowel habit changes 10/27/2011  . Constipation 04/20/2011  . Rectal bleeding 11/16/2010  . NAUSEA 06/08/2010  . DIARRHEA 06/08/2010  . COLONIC POLYPS, ADENOMATOUS, HX OF 07/16/2009  . HYPERTENSION 06/19/2009  . GASTROESOPHAGEAL REFLUX DISEASE, CHRONIC 06/19/2009  . FATTY LIVER DISEASE 06/19/2009  . CHOLELITHIASIS, SYMPTOMATIC 06/19/2009  . RENAL INSUFFICIENCY 06/19/2009  . ABDOMINAL BLOATING 06/19/2009     PLAN:  1. I personally reviewed and went over laboratory results with the patient.  The  results are noted within this dictation. 2. I personally reviewed and went over radiographic studies with the patient.  The results are noted within this dictation.   3. Labs in 12 months: CBC diff, Iron/TIBC, Ferritin, anticardiolipin antibodies 4. If WBC is noted to change abnormally, I would order BCR/ABL, JAK2, and LAP testing, but in light of its stability, I do not see a need for it at this time.  5. Return in 1 year for follow-up.   THERAPY PLAN:  With stability of his counts over a number of years, we will see him back in one year with laboratory work. He will call the clinic sooner with signs and symptoms of worsening/progressive blood counts.  He is educated about B. symptoms. No need for intensive workup at this time.  I will order anticardiolipin antibodies to see if this is the cause of his low platelet count due to his  cardiac history.  All questions were answered. The patient knows to call the clinic with any problems, questions or concerns. We can certainly see the patient much sooner if necessary.  Patient and plan discussed with Dr. Farrel Gobble and he is in agreement with the aforementioned.   Tamey Wanek

## 2013-07-17 ENCOUNTER — Encounter (HOSPITAL_BASED_OUTPATIENT_CLINIC_OR_DEPARTMENT_OTHER): Payer: Medicare Other | Admitting: Oncology

## 2013-07-17 ENCOUNTER — Encounter (HOSPITAL_COMMUNITY): Payer: Self-pay | Admitting: Oncology

## 2013-07-17 VITALS — BP 113/76 | HR 98 | Temp 97.3°F | Resp 20 | Wt 308.6 lb

## 2013-07-17 DIAGNOSIS — H2510 Age-related nuclear cataract, unspecified eye: Secondary | ICD-10-CM | POA: Diagnosis not present

## 2013-07-17 DIAGNOSIS — H4011X0 Primary open-angle glaucoma, stage unspecified: Secondary | ICD-10-CM | POA: Diagnosis not present

## 2013-07-17 DIAGNOSIS — H409 Unspecified glaucoma: Secondary | ICD-10-CM | POA: Diagnosis not present

## 2013-07-17 DIAGNOSIS — D696 Thrombocytopenia, unspecified: Secondary | ICD-10-CM

## 2013-07-17 DIAGNOSIS — D72829 Elevated white blood cell count, unspecified: Secondary | ICD-10-CM

## 2013-07-17 DIAGNOSIS — K625 Hemorrhage of anus and rectum: Secondary | ICD-10-CM

## 2013-07-17 NOTE — Patient Instructions (Signed)
[facility] Discharge Instructions  Return in one year for blood work and follow-up visit.  Thank you for choosing [facility] to provide your oncology and hematology care.  To afford each patient quality time with our providers, please arrive at least 15 minutes before your scheduled appointment time.  With your help, our goal is to use those 15 minutes to complete the necessary work-up to ensure our physicians have the information they need to help with your evaluation and healthcare recommendations.    Effective January 1st, 2014, we ask that you re-schedule your appointment with our physicians should you arrive 10 or more minutes late for your appointment.  We strive to give you quality time with our providers, and arriving late affects you and other patients whose appointments are after yours.    Again, thank you for choosing [facility].  Our hope is that these requests will decrease the amount of time that you wait before being seen by our physicians.       _____________________________________________________________  Should you have questions after your visit to [facility], please contact our office at [phone] between the hours of 8:30 a.m. and 5:00 p.m.  Voicemails left after 4:30 p.m. will not be returned until the following business day.  For prescription refill requests, have your pharmacy contact our office with your prescription refill request.

## 2013-07-18 ENCOUNTER — Encounter (HOSPITAL_COMMUNITY): Payer: Self-pay | Admitting: Emergency Medicine

## 2013-07-18 ENCOUNTER — Inpatient Hospital Stay (HOSPITAL_COMMUNITY)
Admission: EM | Admit: 2013-07-18 | Discharge: 2013-07-24 | DRG: 602 | Disposition: A | Discharge status: Home Health | Payer: Medicare Other | Attending: Family Medicine | Admitting: Family Medicine

## 2013-07-18 DIAGNOSIS — I509 Heart failure, unspecified: Secondary | ICD-10-CM | POA: Diagnosis not present

## 2013-07-18 DIAGNOSIS — I2789 Other specified pulmonary heart diseases: Secondary | ICD-10-CM | POA: Diagnosis not present

## 2013-07-18 DIAGNOSIS — Z800 Family history of malignant neoplasm of digestive organs: Secondary | ICD-10-CM | POA: Diagnosis not present

## 2013-07-18 DIAGNOSIS — E039 Hypothyroidism, unspecified: Secondary | ICD-10-CM | POA: Diagnosis present

## 2013-07-18 DIAGNOSIS — I50810 Right heart failure, unspecified: Secondary | ICD-10-CM | POA: Diagnosis present

## 2013-07-18 DIAGNOSIS — Z6841 Body Mass Index (BMI) 40.0 and over, adult: Secondary | ICD-10-CM | POA: Diagnosis not present

## 2013-07-18 DIAGNOSIS — R0602 Shortness of breath: Secondary | ICD-10-CM | POA: Diagnosis not present

## 2013-07-18 DIAGNOSIS — Z87442 Personal history of urinary calculi: Secondary | ICD-10-CM

## 2013-07-18 DIAGNOSIS — I872 Venous insufficiency (chronic) (peripheral): Secondary | ICD-10-CM | POA: Diagnosis present

## 2013-07-18 DIAGNOSIS — R21 Rash and other nonspecific skin eruption: Secondary | ICD-10-CM | POA: Diagnosis present

## 2013-07-18 DIAGNOSIS — F411 Generalized anxiety disorder: Secondary | ICD-10-CM | POA: Diagnosis present

## 2013-07-18 DIAGNOSIS — M1990 Unspecified osteoarthritis, unspecified site: Secondary | ICD-10-CM | POA: Diagnosis present

## 2013-07-18 DIAGNOSIS — I5033 Acute on chronic diastolic (congestive) heart failure: Secondary | ICD-10-CM | POA: Diagnosis present

## 2013-07-18 DIAGNOSIS — L03116 Cellulitis of left lower limb: Secondary | ICD-10-CM

## 2013-07-18 DIAGNOSIS — Z8711 Personal history of peptic ulcer disease: Secondary | ICD-10-CM | POA: Diagnosis not present

## 2013-07-18 DIAGNOSIS — Z8042 Family history of malignant neoplasm of prostate: Secondary | ICD-10-CM | POA: Diagnosis not present

## 2013-07-18 DIAGNOSIS — Z87891 Personal history of nicotine dependence: Secondary | ICD-10-CM

## 2013-07-18 DIAGNOSIS — L0390 Cellulitis, unspecified: Secondary | ICD-10-CM

## 2013-07-18 DIAGNOSIS — M7989 Other specified soft tissue disorders: Secondary | ICD-10-CM | POA: Diagnosis not present

## 2013-07-18 DIAGNOSIS — N400 Enlarged prostate without lower urinary tract symptoms: Secondary | ICD-10-CM

## 2013-07-18 DIAGNOSIS — I2510 Atherosclerotic heart disease of native coronary artery without angina pectoris: Secondary | ICD-10-CM | POA: Diagnosis present

## 2013-07-18 DIAGNOSIS — Z9119 Patient's noncompliance with other medical treatment and regimen: Secondary | ICD-10-CM

## 2013-07-18 DIAGNOSIS — R609 Edema, unspecified: Secondary | ICD-10-CM | POA: Diagnosis not present

## 2013-07-18 DIAGNOSIS — L03119 Cellulitis of unspecified part of limb: Principal | ICD-10-CM

## 2013-07-18 DIAGNOSIS — E785 Hyperlipidemia, unspecified: Secondary | ICD-10-CM | POA: Diagnosis present

## 2013-07-18 DIAGNOSIS — I10 Essential (primary) hypertension: Secondary | ICD-10-CM | POA: Diagnosis present

## 2013-07-18 DIAGNOSIS — E669 Obesity, unspecified: Secondary | ICD-10-CM | POA: Diagnosis present

## 2013-07-18 DIAGNOSIS — L0291 Cutaneous abscess, unspecified: Secondary | ICD-10-CM | POA: Diagnosis not present

## 2013-07-18 DIAGNOSIS — L02419 Cutaneous abscess of limb, unspecified: Principal | ICD-10-CM | POA: Diagnosis present

## 2013-07-18 DIAGNOSIS — Z91199 Patient's noncompliance with other medical treatment and regimen due to unspecified reason: Secondary | ICD-10-CM | POA: Diagnosis not present

## 2013-07-18 DIAGNOSIS — G4733 Obstructive sleep apnea (adult) (pediatric): Secondary | ICD-10-CM | POA: Diagnosis present

## 2013-07-18 DIAGNOSIS — D696 Thrombocytopenia, unspecified: Secondary | ICD-10-CM | POA: Diagnosis present

## 2013-07-18 DIAGNOSIS — Z87440 Personal history of urinary (tract) infections: Secondary | ICD-10-CM | POA: Diagnosis not present

## 2013-07-18 DIAGNOSIS — K219 Gastro-esophageal reflux disease without esophagitis: Secondary | ICD-10-CM | POA: Diagnosis present

## 2013-07-18 DIAGNOSIS — L03115 Cellulitis of right lower limb: Secondary | ICD-10-CM | POA: Diagnosis present

## 2013-07-18 LAB — CBC WITH DIFFERENTIAL/PLATELET
Basophils Absolute: 0.1 10*3/uL (ref 0.0–0.1)
Basophils Relative: 1 % (ref 0–1)
Eosinophils Absolute: 0.1 10*3/uL (ref 0.0–0.7)
Eosinophils Relative: 1 % (ref 0–5)
HEMATOCRIT: 40.8 % (ref 39.0–52.0)
Hemoglobin: 13.2 g/dL (ref 13.0–17.0)
LYMPHS PCT: 13 % (ref 12–46)
Lymphs Abs: 1.4 10*3/uL (ref 0.7–4.0)
MCH: 26 pg (ref 26.0–34.0)
MCHC: 32.4 g/dL (ref 30.0–36.0)
MCV: 80.5 fL (ref 78.0–100.0)
MONOS PCT: 7 % (ref 3–12)
Monocytes Absolute: 0.7 10*3/uL (ref 0.1–1.0)
NEUTROS ABS: 8.1 10*3/uL — AB (ref 1.7–7.7)
Neutrophils Relative %: 78 % — ABNORMAL HIGH (ref 43–77)
Platelets: 109 10*3/uL — ABNORMAL LOW (ref 150–400)
RBC: 5.07 MIL/uL (ref 4.22–5.81)
RDW: 17.9 % — ABNORMAL HIGH (ref 11.5–15.5)
WBC: 10.4 10*3/uL (ref 4.0–10.5)

## 2013-07-18 LAB — BASIC METABOLIC PANEL
BUN: 11 mg/dL (ref 6–23)
CHLORIDE: 100 meq/L (ref 96–112)
CO2: 33 meq/L — AB (ref 19–32)
CREATININE: 0.91 mg/dL (ref 0.50–1.35)
Calcium: 9 mg/dL (ref 8.4–10.5)
GFR calc Af Amer: >90 mL/min (ref >90)
GFR calc non Af Amer: 82 mL/min — ABNORMAL LOW (ref >90)
Glucose, Bld: 109 mg/dL — ABNORMAL HIGH (ref 70–99)
POTASSIUM: 3.6 meq/L — AB (ref 3.7–5.3)
Sodium: 142 mEq/L (ref 137–147)

## 2013-07-18 MED ORDER — SODIUM CHLORIDE 0.9 % IJ SOLN
3.0000 mL | INTRAMUSCULAR | Status: DC | PRN
Start: 2013-07-18 — End: 2013-07-24

## 2013-07-18 MED ORDER — TIMOLOL MALEATE 0.5 % OP SOLN
1.0000 [drp] | Freq: Two times a day (BID) | OPHTHALMIC | Status: DC
  Administered 2013-07-18 – 2013-07-24 (×12): 1 [drp] via OPHTHALMIC
  Filled 2013-07-18 (×3): qty 5

## 2013-07-18 MED ORDER — PANTOPRAZOLE SODIUM 40 MG PO TBEC
40.0000 mg | DELAYED_RELEASE_TABLET | Freq: Two times a day (BID) | ORAL | Status: DC
  Administered 2013-07-18 – 2013-07-24 (×12): 40 mg via ORAL
  Filled 2013-07-18 (×12): qty 1

## 2013-07-18 MED ORDER — VANCOMYCIN HCL IN DEXTROSE 1-5 GM/200ML-% IV SOLN
1000.0000 mg | Freq: Once | INTRAVENOUS | Status: AC
  Administered 2013-07-18: 1000 mg via INTRAVENOUS
  Filled 2013-07-18: qty 200

## 2013-07-18 MED ORDER — ACETAMINOPHEN 325 MG PO TABS: 650.0000 mg | ORAL_TABLET | Freq: Four times a day (QID) | ORAL | Status: DC | PRN

## 2013-07-18 MED ORDER — ALBUTEROL SULFATE (2.5 MG/3ML) 0.083% IN NEBU: 2.5000 mg | INHALATION_SOLUTION | RESPIRATORY_TRACT | Status: DC | PRN

## 2013-07-18 MED ORDER — OXYCODONE HCL 5 MG PO TABS
5.0000 mg | ORAL_TABLET | Freq: Four times a day (QID) | ORAL | Status: DC | PRN
  Administered 2013-07-19 – 2013-07-24 (×11): 5 mg via ORAL
  Filled 2013-07-18 (×11): qty 1

## 2013-07-18 MED ORDER — DORZOLAMIDE HCL-TIMOLOL MAL PF 22.3-6.8 MG/ML OP SOLN: 1.0000 [drp] | Freq: Two times a day (BID) | OPHTHALMIC | Status: DC

## 2013-07-18 MED ORDER — ONDANSETRON HCL 4 MG/2ML IJ SOLN
4.0000 mg | Freq: Four times a day (QID) | INTRAMUSCULAR | Status: DC | PRN
Start: 2013-07-18 — End: 2013-07-24

## 2013-07-18 MED ORDER — SODIUM CHLORIDE 0.9 % IV SOLN: 250.0000 mL | INTRAVENOUS | Status: DC | PRN

## 2013-07-18 MED ORDER — CYCLOBENZAPRINE HCL 10 MG PO TABS
10.0000 mg | ORAL_TABLET | Freq: Three times a day (TID) | ORAL | Status: DC | PRN
  Administered 2013-07-19 – 2013-07-23 (×6): 10 mg via ORAL
  Filled 2013-07-18 (×6): qty 1

## 2013-07-18 MED ORDER — POTASSIUM CHLORIDE CRYS ER 20 MEQ PO TBCR
40.0000 meq | EXTENDED_RELEASE_TABLET | Freq: Once | ORAL | Status: AC
  Administered 2013-07-18: 40 meq via ORAL
  Filled 2013-07-18: qty 2

## 2013-07-18 MED ORDER — OXYCODONE-ACETAMINOPHEN 5-325 MG PO TABS
1.0000 | ORAL_TABLET | Freq: Four times a day (QID) | ORAL | Status: DC | PRN
  Administered 2013-07-18 – 2013-07-24 (×11): 1 via ORAL
  Filled 2013-07-18 (×11): qty 1

## 2013-07-18 MED ORDER — ATORVASTATIN CALCIUM 40 MG PO TABS
80.0000 mg | ORAL_TABLET | Freq: Every day | ORAL | Status: DC
  Administered 2013-07-18 – 2013-07-23 (×6): 80 mg via ORAL
  Filled 2013-07-18: qty 1
  Filled 2013-07-18 (×7): qty 2
  Filled 2013-07-18: qty 1

## 2013-07-18 MED ORDER — LATANOPROST 0.005 % OP SOLN
1.0000 [drp] | Freq: Every day | OPHTHALMIC | Status: DC
  Administered 2013-07-19 – 2013-07-23 (×6): 1 [drp] via OPHTHALMIC
  Filled 2013-07-18: qty 2.5

## 2013-07-18 MED ORDER — VERAPAMIL HCL ER 240 MG PO TBCR
120.0000 mg | EXTENDED_RELEASE_TABLET | Freq: Every day | ORAL | Status: DC
  Administered 2013-07-18 – 2013-07-21 (×4): 120 mg via ORAL
  Administered 2013-07-22: 20:00:00 via ORAL
  Administered 2013-07-23: 120 mg via ORAL
  Filled 2013-07-18 (×8): qty 1

## 2013-07-18 MED ORDER — DOCUSATE SODIUM 100 MG PO CAPS
100.0000 mg | ORAL_CAPSULE | Freq: Two times a day (BID) | ORAL | Status: DC
  Administered 2013-07-18 – 2013-07-24 (×12): 100 mg via ORAL
  Filled 2013-07-18 (×16): qty 1

## 2013-07-18 MED ORDER — VANCOMYCIN HCL 10 G IV SOLR
1250.0000 mg | Freq: Two times a day (BID) | INTRAVENOUS | Status: DC
  Administered 2013-07-19 – 2013-07-22 (×7): 1250 mg via INTRAVENOUS
  Filled 2013-07-18 (×9): qty 1250

## 2013-07-18 MED ORDER — ACETAMINOPHEN 650 MG RE SUPP: 650.0000 mg | Freq: Four times a day (QID) | RECTAL | Status: DC | PRN

## 2013-07-18 MED ORDER — LATANOPROST 0.005 % OP SOLN
OPHTHALMIC | Status: AC
  Filled 2013-07-18: qty 2.5

## 2013-07-18 MED ORDER — DORZOLAMIDE HCL 2 % OP SOLN
1.0000 [drp] | Freq: Two times a day (BID) | OPHTHALMIC | Status: DC
  Administered 2013-07-19 – 2013-07-24 (×12): 1 [drp] via OPHTHALMIC
  Filled 2013-07-18: qty 10

## 2013-07-18 MED ORDER — FUROSEMIDE 10 MG/ML IJ SOLN
80.0000 mg | Freq: Two times a day (BID) | INTRAMUSCULAR | Status: DC
  Administered 2013-07-18 – 2013-07-24 (×12): 80 mg via INTRAVENOUS
  Filled 2013-07-18 (×12): qty 8

## 2013-07-18 MED ORDER — ENOXAPARIN SODIUM 40 MG/0.4ML ~~LOC~~ SOLN
40.0000 mg | SUBCUTANEOUS | Status: DC
  Administered 2013-07-18 – 2013-07-23 (×6): 40 mg via SUBCUTANEOUS
  Filled 2013-07-18 (×6): qty 0.4

## 2013-07-18 MED ORDER — POTASSIUM CHLORIDE CRYS ER 20 MEQ PO TBCR
20.0000 meq | EXTENDED_RELEASE_TABLET | Freq: Two times a day (BID) | ORAL | Status: DC
  Administered 2013-07-19 – 2013-07-24 (×11): 20 meq via ORAL
  Filled 2013-07-18 (×11): qty 1
  Filled 2013-07-18: qty 2

## 2013-07-18 MED ORDER — ONDANSETRON HCL 4 MG PO TABS
4.0000 mg | ORAL_TABLET | Freq: Four times a day (QID) | ORAL | Status: DC | PRN
Start: 2013-07-18 — End: 2013-07-24
  Administered 2013-07-19: 4 mg via ORAL
  Filled 2013-07-18: qty 1

## 2013-07-18 MED ORDER — TAMSULOSIN HCL 0.4 MG PO CAPS
0.4000 mg | ORAL_CAPSULE | Freq: Every day | ORAL | Status: DC
  Administered 2013-07-18 – 2013-07-23 (×6): 0.4 mg via ORAL
  Filled 2013-07-18 (×6): qty 1

## 2013-07-18 MED ORDER — SODIUM CHLORIDE 0.9 % IJ SOLN
3.0000 mL | Freq: Two times a day (BID) | INTRAMUSCULAR | Status: DC
  Administered 2013-07-19 – 2013-07-23 (×9): 3 mL via INTRAVENOUS

## 2013-07-18 MED ORDER — OXYCODONE-ACETAMINOPHEN 10-325 MG PO TABS: 1.0000 | ORAL_TABLET | Freq: Four times a day (QID) | ORAL | Status: DC | PRN

## 2013-07-18 MED ORDER — METOPROLOL SUCCINATE ER 25 MG PO TB24
12.5000 mg | ORAL_TABLET | Freq: Every morning | ORAL | Status: DC
  Administered 2013-07-19 – 2013-07-24 (×6): 12.5 mg via ORAL
  Filled 2013-07-18 (×8): qty 1

## 2013-07-18 NOTE — H&P (Signed)
[facility] History and Physical  Yang Rack KYH:[id number] DOB: 05-Feb-1941 DOA: 07/18/2013   PCP: [name]., MD  Specialists: None  Chief Complaint: Swelling and pain in both the legs  HPI: [name] [name] is a 73 y.o. male with a past medical history of right-sided congestive heart failure, prostatitis, status post TURP, sinus disease, obesity, who was in his usual state of health till about a week or so ago, when he's noticed that his legs are more swollen than normal. Both his legs have been swollen for the last 6 months or so. He has been taking his Lasix but over the last week the swelling has gotten worse. The legs are more red in discoloration. There also painful. Denies any recent antibiotic use. No fever. No chills. The pain is 10/10 in the right leg and 8/10 in the left. Pain is aching type. The right leg has also been weeping some. He has been able to walk with a cane, but the pain increases when he ambulates. He has had some difficulty breathing, but not more so than usual. Denies any cough, no chest pains. He went to his primary care physician's office today, and he was referred to the emergency department.  Home Medications: Prior to Admission medications   Medication Sig Start Date End Date Taking? Authorizing Provider  acetaminophen (TYLENOL) 325 MG tablet Take 325-650 mg by mouth every 6 (six) hours as needed for pain.    Yes Historical Provider, MD  atorvastatin (LIPITOR) 80 MG tablet Take 80 mg by mouth at bedtime.   Yes Historical Provider, MD  cetirizine (ZYRTEC) 10 MG tablet Take 10 mg by mouth daily.   Yes Historical Provider, MD  cyclobenzaprine (FLEXERIL) 10 MG tablet Take 1 tablet (10 mg total) by mouth 3 (three) times daily as needed for muscle spasms. 08/31/12  Yes Winfield Cunas, MD  docusate sodium (COLACE) 100 MG capsule Take 100 mg by mouth daily as needed for mild constipation.    Yes Historical Provider, MD  Dorzolamide HCl-Timolol Mal PF  22.3-6.8 MG/ML SOLN Apply 1 drop to eye 2 (two) times daily. Dorzolamide HCI-Timolol Maleate Ophthalmic Solution 2.23%/0.68%   Yes Historical Provider, MD  furosemide (LASIX) 80 MG tablet Take 80 mg by mouth 3 (three) times daily.    Yes Historical Provider, MD  latanoprost (XALATAN) 0.005 % ophthalmic solution Place 1 drop into both eyes at bedtime.   Yes Historical Provider, MD  metoprolol succinate (TOPROL-XL) 25 MG 24 hr tablet Take 0.5 tablets (12.5 mg total) by mouth every morning. 02/12/13  Yes Lorretta Harp, MD  oxyCODONE-acetaminophen (PERCOCET) 10-325 MG per tablet Take 1 tablet by mouth every 6 (six) hours as needed for pain. 08/31/12  Yes Winfield Cunas, MD  pantoprazole (PROTONIX) 40 MG tablet Take 40 mg by mouth 2 (two) times daily.   Yes Historical Provider, MD  Polyethyl Glycol-Propyl Glycol (LUBRICANT EYE DROPS) 0.4-0.3 % SOLN Apply 1 drop to eye daily as needed (for lubricant eye drops).   Yes Historical Provider, MD  potassium chloride SA (K-DUR,KLOR-CON) 20 MEQ tablet Take 1 tablet (20 mEq total) by mouth 2 (two) times daily. 06/17/12  Yes Nimish Luther Parody, MD  Tamsulosin HCl (FLOMAX) 0.4 MG CAPS Take 0.4 mg by mouth daily after supper.    Yes Historical Provider, MD  verapamil (CALAN-SR) 120 MG CR tablet Take 1 tablet (120 mg total) by mouth at bedtime. 05/14/13  Yes Penni Bombard, MD    Allergies:  Allergies  Allergen  Reactions  . Aspirin Other (See Comments)    Nausea and upset stomach      Past Medical History: Past Medical History  Diagnosis Date  . CHF (congestive heart failure)   . Obesity   . Hyperlipidemia   . PUD (peptic ulcer disease)   . GERD (gastroesophageal reflux disease)   . S/P endoscopy 2007, 2012    2007: nl, May 2012: antral erosions  . Osteoarthritis   . S/P colonoscopy 2007, Feb and March 2012    hx of adenomas, left-sided diverticula, On 07/2010 TCS, fresh blood and clot noted coming from TI.  Marland Kitchen Anxiety   . Thyroid disease      hypothyroid  . Ringing in ears   . History of kidney stones   . History of bladder infections   . Heel spur     right heel  . Collagen vascular disease     history of venous insufficency and LE cellulitis  . Thrombocytopenia 12/30/2011    Stable  . BPH (benign prostatic hyperplasia)   . Hypertension     Sees Dr. Debara Pickett  . Bilateral lower leg cellulitis   . Sleep apnea     uses oxygen 2L via [place] cannot tolerate CPAP  . Complication of anesthesia     due to sleep apnea pt has had difficulty being put under anesthesia  . PONV (postoperative nausea and vomiting)     Past Surgical History  Procedure Laterality Date  . Knee arthroscopy    . Rotator cuff repair    . Cholecystectomy    . Lumbar spine surgery    . Small bowel capsule study  07/2011    ?transient focal ischemia in distal small bowel to explain GI bleeding  . Esophagogastroduodenoscopy  Aug 2013    [facility], single 41mm pedunculated polyp otherwise normal. HYPERPLASTIC, NEGATIVE h.pylori  . Cardiac catheterization      02/27/07: no sign CAD, LVH, mod pulm HTN  . Transurethral resection of prostate    . Transurethral resection of prostate N/A 06/18/2013    Procedure: TRANSURETHRAL RESECTION OF THE PROSTATE (TURP);  Surgeon: Marissa Nestle, MD;  Location: [facility];  Service: Urology;  Laterality: N/A;    Social History: Patient lives in [place] with his wife. No smoking, alcohol or illicit drug use. Uses a cane to ambulate.  Family History:  Family History  Problem Relation Age of Onset  . Colon cancer Father 53    deceased  . Prostate cancer Father   . Pancreatic cancer Mother 22    deceased     Review of Systems - History obtained from the patient General ROS: positive for  - fatigue Psychological ROS: negative Ophthalmic ROS: negative ENT ROS: negative Allergy and Immunology ROS: negative Hematological and Lymphatic ROS: negative Endocrine ROS: negative Respiratory ROS: as in hpi Cardiovascular ROS: as in  hpi Gastrointestinal ROS: no abdominal pain, change in bowel habits, or black or bloody stools Genito-Urinary ROS: no dysuria, trouble voiding, or hematuria Musculoskeletal ROS: negative Neurological ROS: no TIA or stroke symptoms Dermatological ROS: negative  Physical Examination  Filed Vitals:   07/18/13 1514 07/18/13 1723 07/18/13 1832  BP: 130/65 129/72 132/70  Pulse: 98 88 87  Temp: 97.9 F (36.6 C)    TempSrc: Oral    Resp: 18  20  SpO2: 92% 97% 97%    General appearance: alert, cooperative, appears stated age, no distress and morbidly obese Head: Normocephalic, without obvious abnormality, atraumatic Eyes: conjunctivae/corneas clear. PERRL, EOM's intact.  Throat: lips, mucosa, and tongue normal; teeth and gums normal Neck: no adenopathy, no carotid bruit, no JVD, supple, symmetrical, trachea midline and thyroid not enlarged, symmetric, no tenderness/mass/nodules Resp: Decreased air entry at the bases without any crackles or wheezing. Cardio: regular rate and rhythm, S1, S2 normal, no murmur, click, rub or gallop GI: soft, non-tender; bowel sounds normal; no masses,  no organomegaly Extremities: Both the lower extremities are swollen, with 3+ edema, which is pitting. There is erythematous discoloration both legs. It is warm to touch bilaterally. No obvious area of fluctuation or induration. Peripheral pulses are poorly palpable. Pulses: Poorly palpable in the lower extremities due to edema. Skin: Erythema over bilateral lower extremity. There is also an erythematous, maculopapular rash around his mouth and his face. Neurologic: No focal neurological deficits are present  Laboratory Data: Results for orders placed during the hospital encounter of 07/18/13 (from the past 48 hour(s))  BASIC METABOLIC PANEL     Status: Abnormal   Collection Time    07/18/13  6:10 PM      Result Value Ref Range   Sodium 142  137 - 147 mEq/L   Potassium 3.6 (*) 3.7 - 5.3 mEq/L   Chloride 100   96 - 112 mEq/L   CO2 33 (*) 19 - 32 mEq/L   Glucose, Bld 109 (*) 70 - 99 mg/dL   BUN 11  6 - 23 mg/dL   Creatinine, Ser 0.91  0.50 - 1.35 mg/dL   Calcium 9.0  8.4 - 10.5 mg/dL   GFR calc non Af Amer 82 (*) >90 mL/min   GFR calc Af Amer >90  >90 mL/min   Comment: (NOTE)     The eGFR has been calculated using the CKD EPI equation.     This calculation has not been validated in all clinical situations.     eGFR's persistently <90 mL/min signify possible Chronic Kidney     Disease.  CBC WITH DIFFERENTIAL     Status: Abnormal   Collection Time    07/18/13  6:10 PM      Result Value Ref Range   WBC 10.4  4.0 - 10.5 K/uL   RBC 5.07  4.22 - 5.81 MIL/uL   Hemoglobin 13.2  13.0 - 17.0 g/dL   HCT 40.8  39.0 - 52.0 %   MCV 80.5  78.0 - 100.0 fL   MCH 26.0  26.0 - 34.0 pg   MCHC 32.4  30.0 - 36.0 g/dL   RDW 17.9 (*) 11.5 - 15.5 %   Platelets 109 (*) 150 - 400 K/uL   Comment: RESULT REPEATED AND VERIFIED   Neutrophils Relative % 78 (*) 43 - 77 %   Lymphocytes Relative 13  12 - 46 %   Monocytes Relative 7  3 - 12 %   Eosinophils Relative 1  0 - 5 %   Basophils Relative 1  0 - 1 %   Neutro Abs 8.1 (*) 1.7 - 7.7 K/uL   Lymphs Abs 1.4  0.7 - 4.0 K/uL   Monocytes Absolute 0.7  0.1 - 1.0 K/uL   Eosinophils Absolute 0.1  0.0 - 0.7 K/uL   Basophils Absolute 0.1  0.0 - 0.1 K/uL   Smear Review PLATELET COUNT CONFIRMED BY SMEAR     Comment: LARGE PLATELETS PRESENT    Radiology Reports: No results found.   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 Differential diagnoses includes DVT.   Plan: #1 bilateral lower extremity  cellulitis: He'll be treated with vancomycin. This is most likely due to his chronic venous stasis. He'll be given Lasix intravenously to reduce the swelling. Since DVT is in the, differential we'll get a venous Doppler as well.  #2 history of right-sided congestive heart failure with obstructive sleep apnea: This is stable. Continue Lasix as discussed above. Oxygen at night. He, apparently does not tolerate CPAP. He had an echocardiogram in December, which showed normal systolic function with grade 1 diastolic dysfunction.  #3 history of hypertension: Continue with his antihypertensive regimen.  #4 facial rash: Etiology is unclear. Continue to monitor. May need to utilize low potency steroid cream.   #5 history of thrombocytopenia: He is being followed by the hematology clinic. Counts have been stable.   DVT Prophylaxis: Lovenox Code Status: Full code Family Communication: Discussed with the patient  Disposition Plan: Admit to MedSurg   Further management decisions will depend on results of further testing and patient's response to treatment.  [facility]  [facility] Pager [phone]  If 7PM-7AM, please contact night-coverage [email or web address] Password [facility]  07/18/2013, 8:47 PM

## 2013-07-18 NOTE — ED Provider Notes (Signed)
CSN: [id number]     Arrival date & time 07/18/13  1443 History   First MD Initiated Contact with Patient 07/18/13 1652   This chart was scribed for Nat Christen, MD by Terressa Koyanagi, ED Scribe. This patient was seen in room APA11/AP[facility]11 and the patient's care was started at 5:03 PM.  Chief Complaint  Patient presents with  . Leg Swelling    `   The history is provided by the patient. No language interpreter was used.   HPI Comments: [name] [name] is a 73 y.o. male, with a history of CHF and chronic leg swelling over the past year, who presents to the Emergency Department complaining of acutely worsened bilateral lower leg pain (right leg worse than left leg) onset 2-3 days ago. Pt reports associated bilateral lower leg drainage onset today. Pt was seen by his PCP earlier today and reports that his PCP directed him to come to the ED for his bilateral lower leg pain. Pt reports that he has gained 20 pounds since his last visit to his PCP, which was approximately one month ago.  Pt denies SOB. Pt also states that he has not been to the wound center for approximately a year. Pt currently takes lasix 3x a day. Pt also has a history of bilateral lower leg cellulitis.   Past Medical History  Diagnosis Date  . CHF (congestive heart failure)   . Obesity   . Hyperlipidemia   . PUD (peptic ulcer disease)   . GERD (gastroesophageal reflux disease)   . S/P endoscopy 2007, 2012    2007: nl, May 2012: antral erosions  . Osteoarthritis   . S/P colonoscopy 2007, Feb and March 2012    hx of adenomas, left-sided diverticula, On 07/2010 TCS, fresh blood and clot noted coming from TI.  Marland Kitchen Anxiety   . Thyroid disease     hypothyroid  . Ringing in ears   . History of kidney stones   . History of bladder infections   . Heel spur     right heel  . Collagen vascular disease     history of venous insufficency and LE cellulitis  . Thrombocytopenia 12/30/2011    Stable  . BPH (benign prostatic hyperplasia)   .  Hypertension     Sees Dr. Debara Pickett  . Bilateral lower leg cellulitis   . Sleep apnea     uses oxygen 2L via [place] cannot tolerate CPAP  . Complication of anesthesia     due to sleep apnea pt has had difficulty being put under anesthesia  . PONV (postoperative nausea and vomiting)    Past Surgical History  Procedure Laterality Date  . Knee arthroscopy    . Rotator cuff repair    . Cholecystectomy    . Lumbar spine surgery    . Small bowel capsule study  07/2011    ?transient focal ischemia in distal small bowel to explain GI bleeding  . Esophagogastroduodenoscopy  Aug 2013    [facility], single 84mm pedunculated polyp otherwise normal. HYPERPLASTIC, NEGATIVE h.pylori  . Cardiac catheterization      02/27/07: no sign CAD, LVH, mod pulm HTN  . Transurethral resection of prostate    . Transurethral resection of prostate N/A 06/18/2013    Procedure: TRANSURETHRAL RESECTION OF THE PROSTATE (TURP);  Surgeon: Marissa Nestle, MD;  Location: [facility];  Service: Urology;  Laterality: N/A;   Family History  Problem Relation Age of Onset  . Colon cancer Father 63    deceased  .  Prostate cancer Father   . Pancreatic cancer Mother 32    deceased   History  Substance Use Topics  . Smoking status: Former Smoker -- 8 years    Types: Cigars    Quit date: 08/10/1973  . Smokeless tobacco: Former Systems developer    Quit date: 08/10/1973     Comment: Pt reports intermittent smoking  . Alcohol Use: No    Review of Systems  Cardiovascular: Positive for leg swelling.  Musculoskeletal:       Bilateral leg pain and redness.   All other systems reviewed and are negative.   Allergies  Aspirin  Home Medications   Current Outpatient Rx  Name  Route  Sig  Dispense  Refill  . acetaminophen (TYLENOL) 325 MG tablet   Oral   Take 325-650 mg by mouth every 6 (six) hours as needed for pain.          Marland Kitchen atorvastatin (LIPITOR) 80 MG tablet   Oral   Take 80 mg by mouth at bedtime.         . cetirizine (ZYRTEC) 10  MG tablet   Oral   Take 10 mg by mouth daily.         . cyclobenzaprine (FLEXERIL) 10 MG tablet   Oral   Take 1 tablet (10 mg total) by mouth 3 (three) times daily as needed for muscle spasms.   45 tablet   0   . docusate sodium (COLACE) 100 MG capsule   Oral   Take 100 mg by mouth daily as needed for mild constipation.          . Dorzolamide HCl-Timolol Mal PF 22.3-6.8 MG/ML SOLN   Ophthalmic   Apply 1 drop to eye 2 (two) times daily. Dorzolamide HCI-Timolol Maleate Ophthalmic Solution 2.23%/0.68%         . furosemide (LASIX) 80 MG tablet   Oral   Take 80 mg by mouth 3 (three) times daily.          Marland Kitchen latanoprost (XALATAN) 0.005 % ophthalmic solution   Both Eyes   Place 1 drop into both eyes at bedtime.         . metoprolol succinate (TOPROL-XL) 25 MG 24 hr tablet   Oral   Take 0.5 tablets (12.5 mg total) by mouth every morning.   30 tablet   11   . oxyCODONE-acetaminophen (PERCOCET) 10-325 MG per tablet   Oral   Take 1 tablet by mouth every 6 (six) hours as needed for pain.   70 tablet   0   . pantoprazole (PROTONIX) 40 MG tablet   Oral   Take 40 mg by mouth 2 (two) times daily.         Vladimir Faster Glycol-Propyl Glycol (LUBRICANT EYE DROPS) 0.4-0.3 % SOLN   Ophthalmic   Apply 1 drop to eye daily as needed (for lubricant eye drops).         . potassium chloride SA (K-DUR,KLOR-CON) 20 MEQ tablet   Oral   Take 1 tablet (20 mEq total) by mouth 2 (two) times daily.   60 tablet   0   . Tamsulosin HCl (FLOMAX) 0.4 MG CAPS   Oral   Take 0.4 mg by mouth daily after supper.          . verapamil (CALAN-SR) 120 MG CR tablet   Oral   Take 1 tablet (120 mg total) by mouth at bedtime.   30 tablet   12    BP 129/72  Pulse 88  Temp(Src) 97.9 F (36.6 C) (Oral)  Resp 18  SpO2 97% Physical Exam  Nursing note and vitals reviewed. Constitutional: He is oriented to person, place, and time. He appears well-developed and well-nourished.  HENT:  Head:  Normocephalic and atraumatic.  Eyes: Conjunctivae and EOM are normal. Pupils are equal, round, and reactive to light.  Neck: Normal range of motion. Neck supple.  Cardiovascular: Normal rate, regular rhythm and normal heart sounds.   Pulmonary/Chest: Effort normal and breath sounds normal.  Abdominal: Soft. Bowel sounds are normal.  Musculoskeletal: Normal range of motion. He exhibits edema.  Bilateral lower extremity edema, erythema, flaking, dry skin, clear drainage.   Neurological: He is alert and oriented to person, place, and time.  Skin: Skin is warm and dry.  Psychiatric: He has a normal mood and affect. His behavior is normal.    ED Course  Procedures (including critical care time).DIAGNOSTIC STUDIES: Oxygen Saturation is 97% on RA, normal by my interpretation.    COORDINATION OF CARE:  5:08 PM-Discussed plan to obtain diagnostic labs. Will also order Vancocin. Dicussed likely plan for admission. Pt advised of plan for treatment and pt agrees.  Labs Review Labs Reviewed  BASIC METABOLIC PANEL - Abnormal; Notable for the following:    Potassium 3.6 (*)    CO2 33 (*)    Glucose, Bld 109 (*)    GFR calc non Af Amer 82 (*)    All other components within normal limits  CBC WITH DIFFERENTIAL - Abnormal; Notable for the following:    RDW 17.9 (*)    Platelets 109 (*)    Neutrophils Relative % 78 (*)    Neutro Abs 8.1 (*)    All other components within normal limits   Imaging Review No results found.  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 Now patient has an additional component of cellulitis. Rx IV Vancomycin.   Admit  I personally performed the services described in this documentation, which was scribed in my presence. The recorded information has been reviewed and is accurate.     Nat Christen, MD 07/18/13 2028

## 2013-07-18 NOTE — ED Notes (Signed)
Pt presents with bilateral leg swelling for "a long time". Pt states swelling has worsened and both legs are now draining. Pt also reports pain in both legs. Pt currently denies chest pain. Pt was seen my PCP this morning and advised to come to ED for further evaluation.

## 2013-07-18 NOTE — ED Notes (Signed)
Attempted iv times two without success.

## 2013-07-18 NOTE — Progress Notes (Addendum)
ANTIBIOTIC CONSULT NOTE - INITIAL  Pharmacy Consult for Vancomycin Indication: cellulitis  Allergies  Allergen Reactions  . Aspirin Other (See Comments)    Nausea and upset stomach      Patient Measurements:   Last Recorded Body Weight: 140kg on 07/17/12  Vital Signs: Temp: 97.9 F (36.6 C) (02/26 1514) Temp src: Oral (02/26 1514) BP: 132/70 mmHg (02/26 1832) Pulse Rate: 87 (02/26 1832) Intake/Output from previous day:   Intake/Output from this shift:    Labs:  Recent Labs  07/18/13 1810  WBC 10.4  HGB 13.2  PLT 109*  CREATININE 0.91   The CrCl is unknown because both a height and weight (above a minimum accepted value) are required for this calculation. No results found for this basename: VANCOTROUGH, VANCOPEAK, VANCORANDOM, GENTTROUGH, GENTPEAK, GENTRANDOM, TOBRATROUGH, TOBRAPEAK, TOBRARND, AMIKACINPEAK, AMIKACINTROU, AMIKACIN,  in the last 72 hours   Microbiology: No results found for this or any previous visit (from the past 720 hour(s)).  Medical History: Past Medical History  Diagnosis Date  . CHF (congestive heart failure)   . Obesity   . Hyperlipidemia   . PUD (peptic ulcer disease)   . GERD (gastroesophageal reflux disease)   . S/P endoscopy 2007, 2012    2007: nl, May 2012: antral erosions  . Osteoarthritis   . S/P colonoscopy 2007, Feb and March 2012    hx of adenomas, left-sided diverticula, On 07/2010 TCS, fresh blood and clot noted coming from TI.  Marland Kitchen Anxiety   . Thyroid disease     hypothyroid  . Ringing in ears   . History of kidney stones   . History of bladder infections   . Heel spur     right heel  . Collagen vascular disease     history of venous insufficency and LE cellulitis  . Thrombocytopenia 12/30/2011    Stable  . BPH (benign prostatic hyperplasia)   . Hypertension     Sees Dr. Debara Pickett  . Bilateral lower leg cellulitis   . Sleep apnea     uses oxygen 2L via [place] cannot tolerate CPAP  . Complication of anesthesia     due to  sleep apnea pt has had difficulty being put under anesthesia  . PONV (postoperative nausea and vomiting)     Medications:  Scheduled:    Assessment: 73 yo M with BLE swelling, cellulitis.  BLE are painful and weeping.  This has worsened over the past couple days.   He is afebrile with normal WBC.   Renal function is at patient's baseline.    Vancomycin 2/26>>  Goal of Therapy:  Vancomycin trough level 10-15 mcg/ml  Plan:  Vancomycin 2gm IV x1 dose now then 1250mg  IV q12h Check Vancomycin trough at steady state Monitor renal function and cx data   Biagio Borg 07/18/2013,8:29 PM

## 2013-07-19 ENCOUNTER — Inpatient Hospital Stay (HOSPITAL_COMMUNITY): Payer: Medicare Other

## 2013-07-19 DIAGNOSIS — I5033 Acute on chronic diastolic (congestive) heart failure: Secondary | ICD-10-CM

## 2013-07-19 DIAGNOSIS — L0390 Cellulitis, unspecified: Secondary | ICD-10-CM

## 2013-07-19 DIAGNOSIS — G4733 Obstructive sleep apnea (adult) (pediatric): Secondary | ICD-10-CM

## 2013-07-19 DIAGNOSIS — L0291 Cutaneous abscess, unspecified: Secondary | ICD-10-CM

## 2013-07-19 LAB — BASIC METABOLIC PANEL
BUN: 11 mg/dL (ref 6–23)
CALCIUM: 9 mg/dL (ref 8.4–10.5)
CO2: 34 meq/L — AB (ref 19–32)
CREATININE: 0.86 mg/dL (ref 0.50–1.35)
Chloride: 101 mEq/L (ref 96–112)
GFR calc Af Amer: >90 mL/min (ref >90)
GFR, EST NON AFRICAN AMERICAN: 84 mL/min — AB (ref >90)
GLUCOSE: 113 mg/dL — AB (ref 70–99)
Potassium: 4.1 mEq/L (ref 3.7–5.3)
Sodium: 141 mEq/L (ref 137–147)

## 2013-07-19 LAB — CBC
HCT: 41.9 % (ref 39.0–52.0)
Hemoglobin: 13.7 g/dL (ref 13.0–17.0)
MCH: 26.2 pg (ref 26.0–34.0)
MCHC: 32.7 g/dL (ref 30.0–36.0)
MCV: 80.3 fL (ref 78.0–100.0)
PLATELETS: 95 10*3/uL — AB (ref 150–400)
RBC: 5.22 MIL/uL (ref 4.22–5.81)
RDW: 17.8 % — ABNORMAL HIGH (ref 11.5–15.5)
WBC: 9.1 10*3/uL (ref 4.0–10.5)

## 2013-07-19 MED ORDER — LATANOPROST 0.005 % OP SOLN
OPHTHALMIC | Status: AC
  Filled 2013-07-19: qty 2.5

## 2013-07-19 NOTE — Progress Notes (Signed)
[facility] PROGRESS NOTE  Rayon Mcchristian WUJ:[id number] DOB: 02/01/1941 DOA: 07/18/2013 PCP: [name]., MD  Assessment/Plan: 1. Lower extremity cellulitis, bilateral. Patient is on vancomycin. He is afebrile has normal WBC count. Continue current treatments for now. 2. Acute on chronic diastolic congestive for. Patient likely has right-sided heart failure due to pulmonary hypertension. He has a history of obstructive sleep apnea but has not been able to tolerate CPAP therapy. He diuresed well with IV Lasix. We'll continue current treatments. Keep legs elevated. Echocardiogram was recently done and 04/2013 and will not be repeated. 3. Thrombocytopenia, chronic. Patient is followed in hematology clinic. 4. Hypertension. Stable  Code Status: Full code Family Communication: discussed with patient and multiple family members Disposition Plan: discharge home once improved   Consultants:    Procedures:    Antibiotics:  Vancomycin 2/26  HPI/Subjective: Feeling a little better but legs are still painful  Objective: Filed Vitals:   07/19/13 1457  BP: 116/74  Pulse: 86  Temp: 98.1 F (36.7 C)  Resp: 18    Intake/Output Summary (Last 24 hours) at 07/19/13 1820 Last data filed at 07/19/13 1700  Gross per 24 hour  Intake    960 ml  Output   2225 ml  Net  -1265 ml   Filed Weights   07/18/13 2133 07/19/13 0532  Weight: 136.397 kg (300 lb 11.2 oz) 134.582 kg (296 lb 11.2 oz)    Exam:   General:  NAD  Cardiovascular: s1, S2 RRR  Respiratory: CTA B  Abdomen: soft, nt, nd, bs+  Musculoskeletal: 1+ pitting edema bilaterally with erythema   Data Reviewed: Basic Metabolic Panel:  Recent Labs Lab 07/18/13 1810 07/19/13 0525  NA 142 141  K 3.6* 4.1  CL 100 101  CO2 33* 34*  GLUCOSE 109* 113*  BUN 11 11  CREATININE 0.91 0.86  CALCIUM 9.0 9.0   Liver Function Tests: No results found for this basename: AST, ALT, ALKPHOS, BILITOT, PROT, ALBUMIN,   in the last 168 hours No results found for this basename: LIPASE, AMYLASE,  in the last 168 hours No results found for this basename: AMMONIA,  in the last 168 hours CBC:  Recent Labs Lab 07/18/13 1810 07/19/13 0525  WBC 10.4 9.1  NEUTROABS 8.1*  --   HGB 13.2 13.7  HCT 40.8 41.9  MCV 80.5 80.3  PLT 109* 95*   Cardiac Enzymes: No results found for this basename: CKTOTAL, CKMB, CKMBINDEX, TROPONINI,  in the last 168 hours BNP (last 3 results)  Recent Labs  05/25/13 1239  PROBNP 26.1   CBG: No results found for this basename: GLUCAP,  in the last 168 hours  No results found for this or any previous visit (from the past 240 hour(s)).   Studies: US Venous Img Lower Bilateral  07/19/2013   CLINICAL DATA:  Bilateral leg swelling  EXAM: Bilateral LOWER EXTREMITY VENOUS DOPPLER ULTRASOUND  TECHNIQUE: Gray-scale sonography with graded compression, as well as color Doppler and duplex ultrasound, were performed to evaluate the deep venous system from the level of the common femoral vein through the popliteal and proximal calf veins. Spectral Doppler was utilized to evaluate flow at rest and with distal augmentation maneuvers.  COMPARISON:  Prior DVT ultrasound 06/15/2012  FINDINGS: Thrombus within deep veins: The bilateral lower extremity veins are well imaged through the popliteal and posterior tibial veins. Secondary to patient body habitus, right calf bandage material and superficial subcutaneous edema, the peroneal veins are very poorly visualized. No evidence of a  thrombus within the visualized veins.  Compressibility of deep veins:  Normal.  Duplex waveform respiratory phasicity:  Normal.  Duplex waveform response to augmentation:  Normal.  Venous reflux:  None visualized.  Other findings: The superficial great saphenous vein is patent and compressible bilaterally.  IMPRESSION: Negative for acute DVT.   Electronically Signed   By: Jacqulynn Cadet M.D.   On: 07/19/2013 09:46     Scheduled Meds: . atorvastatin  80 mg Oral QHS  . docusate sodium  100 mg Oral BID  . dorzolamide  1 drop Both Eyes BID   And  . timolol  1 drop Both Eyes BID  . enoxaparin (LOVENOX) injection  40 mg Subcutaneous Q24H  . furosemide  80 mg Intravenous Q12H  . latanoprost  1 drop Both Eyes QHS  . metoprolol succinate  12.5 mg Oral q morning - 10a  . pantoprazole  40 mg Oral BID  . potassium chloride SA  20 mEq Oral BID  . sodium chloride  3 mL Intravenous Q12H  . tamsulosin  0.4 mg Oral QPC supper  . vancomycin  1,250 mg Intravenous Q12H  . 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 If 7PM-7AM, please contact night-coverage at [email or web address], password [facility] 07/19/2013, 6:20 PM  LOS: 1 day

## 2013-07-19 NOTE — Evaluation (Signed)
Physical Therapy Evaluation Patient Details Name: [name] [name] MRN: [id number] DOB: 01-05-1941 Today's Date: 07/19/2013 Time: 8185-6314 PT Time Calculation (min): 20 min  PT Assessment / Plan / Recommendation History of Present Illness  Pt is admitted due to bilateral LE cellulitis.  There was so much pain in his LEs with weight bearing that he was unable to ambulate.  He is a morbidly obese gentleman who had prostate surgery 2 weeks ago and had lumbar surgery this past fall for which he had to transfer to SNF in order to regain the ability to ambulate.  He lives with his wife who is under [name] [name] and his son who looks after both of them.  Clinical Impression   Pt was seen for evaluation.  He reports decreased pain in both legs, but pain does persist.  He is able to ambulate with no assistive device with no instability but he was instructed in gait with a walker in order to off-load weight from the RLE during stance.  His gait is stable with no antalgia.    PT Assessment  Patent does not need any further PT services    Follow Up Recommendations  No PT follow up                Equipment Recommendations  None recommended by PT             Precautions / Restrictions Precautions Precautions: None Restrictions Weight Bearing Restrictions: No         Mobility  Bed Mobility Overal bed mobility: Independent Transfers Overall transfer level: Independent Equipment used: None Ambulation/Gait Ambulation/Gait assistance: Modified independent (Device/Increase time) Ambulation Distance (Feet): 200 Feet Assistive device: Rolling walker (2 wheeled) Gait velocity interpretation: at or above normal speed for age/gender General Gait Details: pt is able to ambulate with no assistive device but he was instructed in use of a walker in order to off-load weight from RLE for pain control.             PT Goals(Current goals can be found in the care plan section) Acute Rehab PT  Goals PT Goal Formulation: No goals set, d/c therapy  Visit Information  Last PT Received On: 07/19/13 History of Present Illness: Pt is admitted due to bilateral LE cellulitis.  There was so much pain in his LEs with weight bearing that he was unable to ambulate.  He is a morbidly obese gentleman who had prostate surgery 2 weeks ago and had lumbar surgery this past fall for which he had to transfer to SNF in order to regain the ability to ambulate.  He lives with his wife who is under [name] [name] and his son who looks after both of them.       Prior [place] expects to be discharged to:: Private residence Living Arrangements: Spouse/significant other;Children Available Help at Discharge: Family Type of Home: House Home Access: Stairs to enter Technical brewer of Steps: 3 Entrance Stairs-Rails: None Home Layout: One level Home Equipment: Environmental consultant - 2 wheels;Bedside commode;Shower seat Additional Comments: sleeps in a recliner chair, has O2 at home for PRN use Prior Function Level of Independence: Independent with assistive device(s) Communication Communication: No difficulties Dominant Hand: Right    Cognition  Cognition Arousal/Alertness: Awake/alert Behavior During Therapy: WFL for tasks assessed/performed Overall Cognitive Status: Within Functional Limits for tasks assessed    Extremity/Trunk Assessment Lower Extremity Assessment Lower Extremity Assessment: Overall WFL for tasks assessed   Balance Balance Overall balance assessment: No  apparent balance deficits (not formally assessed)  End of Session PT - End of Session Equipment Utilized During Treatment: Gait belt Activity Tolerance: Patient tolerated treatment well Patient left: in chair;with call bell/phone within reach Nurse Communication: Mobility status  GP     [name] [name] 07/19/2013, 12:19 PM

## 2013-07-19 NOTE — Progress Notes (Signed)
NURSING PROGRESS NOTE  Brylin Stanislawski [id number] Admitted to 328: 07/18/13 Attending Provider: Bonnielee Haff, MD    [name] [name] is a 73 y.o. male patient admitted from ED awake, alert  & orientated  X 4,  Full Code, VSS - Blood pressure 156/83, pulse 91, temperature 97.2 F (36.2 C), temperature source Oral, resp. rate 18, height 5\' 8"  (1.727 m), weight 136.397 kg (300 lb 11.2 oz), SpO2 97.00%., no c/o shortness of breath, no c/o chest pain, no distress noted.    IV site WDL:  Left AC with a transparent dsg that's clean dry and intact.  Allergies:   Allergies  Allergen Reactions  . Aspirin Other (See Comments)    Nausea and upset stomach       Past Medical History  Diagnosis Date  . CHF (congestive heart failure)   . Obesity   . Hyperlipidemia   . PUD (peptic ulcer disease)   . GERD (gastroesophageal reflux disease)   . S/P endoscopy 2007, 2012    2007: nl, May 2012: antral erosions  . Osteoarthritis   . S/P colonoscopy 2007, Feb and March 2012    hx of adenomas, left-sided diverticula, On 07/2010 TCS, fresh blood and clot noted coming from TI.  Marland Kitchen Anxiety   . Thyroid disease     hypothyroid  . Ringing in ears   . History of kidney stones   . History of bladder infections   . Heel spur     right heel  . Collagen vascular disease     history of venous insufficency and LE cellulitis  . Thrombocytopenia 12/30/2011    Stable  . BPH (benign prostatic hyperplasia)   . Hypertension     Sees Dr. Debara Pickett  . Bilateral lower leg cellulitis   . Sleep apnea     uses oxygen 2L via [place] cannot tolerate CPAP  . Complication of anesthesia     due to sleep apnea pt has had difficulty being put under anesthesia  . PONV (postoperative nausea and vomiting)     History:  obtained from patient.  Pt orientation to unit, room and routine. Information packet given to patient.  Admission INP armband ID verified with patient, and in place. SR up x 2, fall risk assessment complete with  Patient verbalized understanding of risks associated with falls. Pt verbalizes an understanding of how to use the call bell and to call for help before getting out of bed. Skin: bilateral lower leg edema +3, right lower leg has two small open wounds that weep at times.    Will cont to monitor and assist as needed.  Regino Bellow, RN 07/18/13

## 2013-07-19 NOTE — Progress Notes (Signed)
Pt's legs has much improvement per pt. Legs are slightly red bilaterally and all weeping has stopped at this time. Gauze applied to right lower leg. Old blisters present but intact.

## 2013-07-20 DIAGNOSIS — N400 Enlarged prostate without lower urinary tract symptoms: Secondary | ICD-10-CM

## 2013-07-20 LAB — BASIC METABOLIC PANEL
BUN: 12 mg/dL (ref 6–23)
CALCIUM: 9.7 mg/dL (ref 8.4–10.5)
CHLORIDE: 97 meq/L (ref 96–112)
CO2: 34 meq/L — AB (ref 19–32)
Creatinine, Ser: 0.97 mg/dL (ref 0.50–1.35)
GFR calc Af Amer: >90 mL/min (ref >90)
GFR calc non Af Amer: 80 mL/min — ABNORMAL LOW (ref >90)
Glucose, Bld: 117 mg/dL — ABNORMAL HIGH (ref 70–99)
Potassium: 3.7 mEq/L (ref 3.7–5.3)
SODIUM: 141 meq/L (ref 137–147)

## 2013-07-20 NOTE — Progress Notes (Signed)
Utilization review Completed Damaris Abeln RN BSN   

## 2013-07-20 NOTE — Progress Notes (Signed)
ANTIBIOTIC CONSULT NOTE   Pharmacy Consult for Vancomycin Indication: cellulitis  Allergies  Allergen Reactions  . Aspirin Other (See Comments)    Nausea and upset stomach     Patient Measurements: Height: 5\' 8"  (172.7 cm) Weight: 297 lb 11.2 oz (135.036 kg) IBW/kg (Calculated) : 68.4  Vital Signs: Temp: 98.1 F (36.7 C) (02/28 0500) Temp src: Oral (02/28 0500) BP: 130/63 mmHg (02/28 0500) Pulse Rate: 80 (02/28 0500) Intake/Output from previous day: 02/27 0701 - 02/28 0700 In: 1200 [P.O.:1200] Out: 1100 [Urine:1100] Intake/Output from this shift: Total I/O In: -  Out: 875 [Urine:875]  Labs:  Recent Labs  07/18/13 1810 07/19/13 0525 07/20/13 0605  WBC 10.4 9.1  --   HGB 13.2 13.7  --   PLT 109* 95*  --   CREATININE 0.91 0.86 0.97   Estimated Creatinine Clearance: 91.1 ml/min (by C-G formula based on Cr of 0.97). No results found for this basename: VANCOTROUGH, VANCOPEAK, VANCORANDOM, GENTTROUGH, GENTPEAK, GENTRANDOM, TOBRATROUGH, TOBRAPEAK, TOBRARND, AMIKACINPEAK, AMIKACINTROU, AMIKACIN,  in the last 72 hours   Microbiology: No results found for this or any previous visit (from the past 720 hour(s)).  Medical History: Past Medical History  Diagnosis Date  . CHF (congestive heart failure)   . Obesity   . Hyperlipidemia   . PUD (peptic ulcer disease)   . GERD (gastroesophageal reflux disease)   . S/P endoscopy 2007, 2012    2007: nl, May 2012: antral erosions  . Osteoarthritis   . S/P colonoscopy 2007, Feb and March 2012    hx of adenomas, left-sided diverticula, On 07/2010 TCS, fresh blood and clot noted coming from TI.  Marland Kitchen Anxiety   . Thyroid disease     hypothyroid  . Ringing in ears   . History of kidney stones   . History of bladder infections   . Heel spur     right heel  . Collagen vascular disease     history of venous insufficency and LE cellulitis  . Thrombocytopenia 12/30/2011    Stable  . BPH (benign prostatic hyperplasia)   .  Hypertension     Sees Dr. Debara Pickett  . Bilateral lower leg cellulitis   . Sleep apnea     uses oxygen 2L via [place] cannot tolerate CPAP  . Complication of anesthesia     due to sleep apnea pt has had difficulty being put under anesthesia  . PONV (postoperative nausea and vomiting)    Medications:  Scheduled:  . atorvastatin  80 mg Oral QHS  . docusate sodium  100 mg Oral BID  . dorzolamide  1 drop Both Eyes BID   And  . timolol  1 drop Both Eyes BID  . enoxaparin (LOVENOX) injection  40 mg Subcutaneous Q24H  . furosemide  80 mg Intravenous Q12H  . latanoprost  1 drop Both Eyes QHS  . metoprolol succinate  12.5 mg Oral q morning - 10a  . pantoprazole  40 mg Oral BID  . potassium chloride SA  20 mEq Oral BID  . sodium chloride  3 mL Intravenous Q12H  . tamsulosin  0.4 mg Oral QPC supper  . vancomycin  1,250 mg Intravenous Q12H  . verapamil  120 mg Oral QHS   Assessment: 73 yo M with BLE swelling, cellulitis.  BLE are painful and weeping.  This has worsened over the past couple days.   He is afebrile with normal WBC.   Renal function is at patient's baseline and stable.  Vancomycin 2/26>>  Goal of Therapy:  Vancomycin trough level 10-15 mcg/ml  Plan:  Vancomycin 1250mg  IV q12h Check Vancomycin trough at steady state Monitor renal function and cx data   Hart Robinsons A 07/20/2013,10:10 AM

## 2013-07-20 NOTE — Progress Notes (Signed)
[facility] PROGRESS NOTE  [name] [name] EVO:[id number] DOB: 05/07/1941 DOA: 07/18/2013 PCP: [name]., MD  Assessment/Plan: 1. Lower extremity cellulitis, bilateral. Patient is on vancomycin. He is afebrile has normal WBC count. Continue current treatments for now. 2. Acute on chronic diastolic congestive for. Patient likely has right-sided heart failure due to pulmonary hypertension. He has a history of obstructive sleep apnea but has not been able to tolerate CPAP therapy. He diuresed well with IV Lasix. We'll continue current treatments. Keep legs elevated. Echocardiogram was recently done and 04/2013 and will not be repeated. He still has evidence of volume overload and needs continued IV diuresis.  Will place on fluid restriction. 3. Thrombocytopenia, chronic. Patient is followed in hematology clinic. 4. Hypertension. Stable  Code Status: Full code Family Communication: discussed with patient  Disposition Plan: discharge home once improved   Consultants:    Procedures:    Antibiotics:  Vancomycin 2/26  HPI/Subjective: Feels that legs are less tight, feels edema is improving.  Objective: Filed Vitals:   07/20/13 1018  BP: 139/84  Pulse: 96  Temp:   Resp:     Intake/Output Summary (Last 24 hours) at 07/20/13 1513 Last data filed at 07/20/13 1200  Gross per 24 hour  Intake   1200 ml  Output   1975 ml  Net   -775 ml   Filed Weights   07/18/13 2133 07/19/13 0532 07/20/13 0500  Weight: 136.397 kg (300 lb 11.2 oz) 134.582 kg (296 lb 11.2 oz) 135.036 kg (297 lb 11.2 oz)    Exam:   General:  NAD  Cardiovascular: s1, S2 RRR  Respiratory: CTA B  Abdomen: soft, nt, nd, bs+  Musculoskeletal: 1+ pitting edema bilaterally with erythema, appears to be improving   Data Reviewed: Basic Metabolic Panel:  Recent Labs Lab 07/18/13 1810 07/19/13 0525 07/20/13 0605  NA 142 141 141  K 3.6* 4.1 3.7  CL 100 101 97  CO2 33* 34* 34*  GLUCOSE 109*  113* 117*  BUN 11 11 12   CREATININE 0.91 0.86 0.97  CALCIUM 9.0 9.0 9.7   Liver Function Tests: No results found for this basename: AST, ALT, ALKPHOS, BILITOT, PROT, ALBUMIN,  in the last 168 hours No results found for this basename: LIPASE, AMYLASE,  in the last 168 hours No results found for this basename: AMMONIA,  in the last 168 hours CBC:  Recent Labs Lab 07/18/13 1810 07/19/13 0525  WBC 10.4 9.1  NEUTROABS 8.1*  --   HGB 13.2 13.7  HCT 40.8 41.9  MCV 80.5 80.3  PLT 109* 95*   Cardiac Enzymes: No results found for this basename: CKTOTAL, CKMB, CKMBINDEX, TROPONINI,  in the last 168 hours BNP (last 3 results)  Recent Labs  05/25/13 1239  PROBNP 26.1   CBG: No results found for this basename: GLUCAP,  in the last 168 hours  No results found for this or any previous visit (from the past 240 hour(s)).   Studies: US Venous Img Lower Bilateral  07/19/2013   CLINICAL DATA:  Bilateral leg swelling  EXAM: Bilateral LOWER EXTREMITY VENOUS DOPPLER ULTRASOUND  TECHNIQUE: Gray-scale sonography with graded compression, as well as color Doppler and duplex ultrasound, were performed to evaluate the deep venous system from the level of the common femoral vein through the popliteal and proximal calf veins. Spectral Doppler was utilized to evaluate flow at rest and with distal augmentation maneuvers.  COMPARISON:  Prior DVT ultrasound 06/15/2012  FINDINGS: Thrombus within deep veins: The bilateral lower extremity  veins are well imaged through the popliteal and posterior tibial veins. Secondary to patient body habitus, right calf bandage material and superficial subcutaneous edema, the peroneal veins are very poorly visualized. No evidence of a thrombus within the visualized veins.  Compressibility of deep veins:  Normal.  Duplex waveform respiratory phasicity:  Normal.  Duplex waveform response to augmentation:  Normal.  Venous reflux:  None visualized.  Other findings: The superficial great  saphenous vein is patent and compressible bilaterally.  IMPRESSION: Negative for acute DVT.   Electronically Signed   By: Jacqulynn Cadet M.D.   On: 07/19/2013 09:46    Scheduled Meds: . atorvastatin  80 mg Oral QHS  . docusate sodium  100 mg Oral BID  . dorzolamide  1 drop Both Eyes BID   And  . timolol  1 drop Both Eyes BID  . enoxaparin (LOVENOX) injection  40 mg Subcutaneous Q24H  . furosemide  80 mg Intravenous Q12H  . latanoprost  1 drop Both Eyes QHS  . metoprolol succinate  12.5 mg Oral q morning - 10a  . pantoprazole  40 mg Oral BID  . potassium chloride SA  20 mEq Oral BID  . sodium chloride  3 mL Intravenous Q12H  . tamsulosin  0.4 mg Oral QPC supper  . vancomycin  1,250 mg Intravenous Q12H  . 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 If 7PM-7AM, please contact night-coverage at [email or web address], password [facility] 07/20/2013, 3:13 PM  LOS: 2 days

## 2013-07-21 LAB — BASIC METABOLIC PANEL
BUN: 12 mg/dL (ref 6–23)
CO2: 36 meq/L — AB (ref 19–32)
Calcium: 9.4 mg/dL (ref 8.4–10.5)
Chloride: 97 mEq/L (ref 96–112)
Creatinine, Ser: 0.98 mg/dL (ref 0.50–1.35)
GFR calc Af Amer: >90 mL/min (ref >90)
GFR, EST NON AFRICAN AMERICAN: 80 mL/min — AB (ref >90)
GLUCOSE: 114 mg/dL — AB (ref 70–99)
POTASSIUM: 4 meq/L (ref 3.7–5.3)
Sodium: 139 mEq/L (ref 137–147)

## 2013-07-21 NOTE — Progress Notes (Signed)
[facility] PROGRESS NOTE  [name] [name] VFI:[id number] DOB: 28-May-1940 DOA: 07/18/2013 PCP: [name]., MD  Assessment/Plan: 1. Lower extremity cellulitis, bilateral. Patient is on vancomycin day 4. He is afebrile has normal WBC count. Continue current treatments for now. 2. Acute on chronic diastolic congestive heart failure. Patient likely has right-sided heart failure due to pulmonary hypertension. He has a history of obstructive sleep apnea but has not been able to tolerate CPAP therapy. He is diuresing well with IV Lasix. We'll continue current treatments. Keep legs elevated. Echocardiogram was recently done and 04/2013 and will not be repeated. He continues to have evidence of volume overload and needs continued IV diuresis.  Continue fluid restriction 3. Thrombocytopenia, chronic. Patient is followed in hematology clinic. 4. Hypertension. Stable  Code Status: Full code Family Communication: discussed with patient  Disposition Plan: discharge home once improved   Consultants:    Procedures:    Antibiotics:  Vancomycin 2/26  HPI/Subjective: No new complaints.  Reports good urine output. Feels that leg edema is getting better.  Objective: Filed Vitals:   07/21/13 1509  BP: 103/67  Pulse: 80  Temp: 97.8 F (36.6 C)  Resp: 20    Intake/Output Summary (Last 24 hours) at 07/21/13 1711 Last data filed at 07/21/13 1130  Gross per 24 hour  Intake   1240 ml  Output   3230 ml  Net  -1990 ml   Filed Weights   07/19/13 0532 07/20/13 0500 07/21/13 0500  Weight: 134.582 kg (296 lb 11.2 oz) 135.036 kg (297 lb 11.2 oz) 134.5 kg (296 lb 8.3 oz)    Exam:   General:  NAD  Cardiovascular: s1, S2 RRR  Respiratory: CTA B  Abdomen: soft, nt, nd, bs+  Musculoskeletal: 1+ pitting edema bilaterally with erythema, appears to be improving   Data Reviewed: Basic Metabolic Panel:  Recent Labs Lab 07/18/13 1810 07/19/13 0525 07/20/13 0605 07/21/13 0609   NA 142 141 141 139  K 3.6* 4.1 3.7 4.0  CL 100 101 97 97  CO2 33* 34* 34* 36*  GLUCOSE 109* 113* 117* 114*  BUN 11 11 12 12   CREATININE 0.91 0.86 0.97 0.98  CALCIUM 9.0 9.0 9.7 9.4   Liver Function Tests: No results found for this basename: AST, ALT, ALKPHOS, BILITOT, PROT, ALBUMIN,  in the last 168 hours No results found for this basename: LIPASE, AMYLASE,  in the last 168 hours No results found for this basename: AMMONIA,  in the last 168 hours CBC:  Recent Labs Lab 07/18/13 1810 07/19/13 0525  WBC 10.4 9.1  NEUTROABS 8.1*  --   HGB 13.2 13.7  HCT 40.8 41.9  MCV 80.5 80.3  PLT 109* 95*   Cardiac Enzymes: No results found for this basename: CKTOTAL, CKMB, CKMBINDEX, TROPONINI,  in the last 168 hours BNP (last 3 results)  Recent Labs  05/25/13 1239  PROBNP 26.1   CBG: No results found for this basename: GLUCAP,  in the last 168 hours  No results found for this or any previous visit (from the past 240 hour(s)).   Studies: No results found.  Scheduled Meds: . atorvastatin  80 mg Oral QHS  . docusate sodium  100 mg Oral BID  . dorzolamide  1 drop Both Eyes BID   And  . timolol  1 drop Both Eyes BID  . enoxaparin (LOVENOX) injection  40 mg Subcutaneous Q24H  . furosemide  80 mg Intravenous Q12H  . latanoprost  1 drop Both Eyes QHS  .  metoprolol succinate  12.5 mg Oral q morning - 10a  . pantoprazole  40 mg Oral BID  . potassium chloride SA  20 mEq Oral BID  . sodium chloride  3 mL Intravenous Q12H  . tamsulosin  0.4 mg Oral QPC supper  . vancomycin  1,250 mg Intravenous Q12H  . 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 If 7PM-7AM, please contact  night-coverage at [email or web address], password [facility] 07/21/2013, 5:10 PM  LOS: 3 days

## 2013-07-22 LAB — BASIC METABOLIC PANEL
BUN: 12 mg/dL (ref 6–23)
CHLORIDE: 100 meq/L (ref 96–112)
CO2: 34 meq/L — AB (ref 19–32)
CREATININE: 0.92 mg/dL (ref 0.50–1.35)
Calcium: 9.1 mg/dL (ref 8.4–10.5)
GFR calc non Af Amer: 82 mL/min — ABNORMAL LOW (ref >90)
GLUCOSE: 112 mg/dL — AB (ref 70–99)
Potassium: 4 mEq/L (ref 3.7–5.3)
Sodium: 140 mEq/L (ref 137–147)

## 2013-07-22 LAB — CBC
HEMATOCRIT: 40.6 % (ref 39.0–52.0)
HEMOGLOBIN: 13.2 g/dL (ref 13.0–17.0)
MCH: 26 pg (ref 26.0–34.0)
MCHC: 32.5 g/dL (ref 30.0–36.0)
MCV: 79.9 fL (ref 78.0–100.0)
Platelets: 104 10*3/uL — ABNORMAL LOW (ref 150–400)
RBC: 5.08 MIL/uL (ref 4.22–5.81)
RDW: 17.6 % — ABNORMAL HIGH (ref 11.5–15.5)
WBC: 9 10*3/uL (ref 4.0–10.5)

## 2013-07-22 LAB — VANCOMYCIN, TROUGH: VANCOMYCIN TR: 19.9 ug/mL (ref 10.0–20.0)

## 2013-07-22 MED ORDER — DOXYCYCLINE HYCLATE 100 MG PO TABS
100.0000 mg | ORAL_TABLET | Freq: Two times a day (BID) | ORAL | Status: DC
  Administered 2013-07-22 – 2013-07-24 (×4): 100 mg via ORAL
  Filled 2013-07-22 (×4): qty 1

## 2013-07-22 NOTE — Progress Notes (Signed)
i have known this pt for long time he has recrrent urinary retention please get bladder scan to check residual urine also send urine c/s.

## 2013-07-22 NOTE — Progress Notes (Signed)
[facility] PROGRESS NOTE  [name] [name] XBM:[id number] DOB: 22-Aug-1940 DOA: 07/18/2013 PCP: [name]., MD  Assessment/Plan: 1. Lower extremity cellulitis, bilateral. Patient is on vancomycin day 5. He is afebrile has normal WBC count. Redness and warmth appear to be clinically improving.  Will de escalate antibiotics to doxycycline. 2. Acute on chronic diastolic congestive heart failure. Patient likely has right-sided heart failure due to pulmonary hypertension. He has a history of obstructive sleep apnea but has not been able to tolerate CPAP therapy. He is diuresing well with IV Lasix. We'll continue current treatments. Keep legs elevated. Echocardiogram was recently done and 04/2013 and will not be repeated. He continues to have evidence of volume overload and needs continued IV diuresis.  Continue fluid restriction 3. Thrombocytopenia, chronic. Patient is followed in hematology clinic. 4. Hypertension. Stable  Code Status: Full code Family Communication: discussed with patient  Disposition Plan: discharge home once improved   Consultants:    Procedures:    Antibiotics:  Vancomycin 2/26>>3/2  Doxycycline 3/2>>  HPI/Subjective: Had some pain in left leg this morning and while walking.  Feels that gauze dressing may be wrapped too tight. Symptoms improved after dressing was removed.  Objective: Filed Vitals:   07/22/13 1442  BP: 120/69  Pulse: 85  Temp: 97.4 F (36.3 C)  Resp: 20    Intake/Output Summary (Last 24 hours) at 07/22/13 1808 Last data filed at 07/22/13 1752  Gross per 24 hour  Intake      0 ml  Output   3725 ml  Net  -3725 ml   Filed Weights   07/20/13 0500 07/21/13 0500 07/22/13 0544  Weight: 135.036 kg (297 lb 11.2 oz) 134.5 kg (296 lb 8.3 oz) 134.4 kg (296 lb 4.8 oz)    Exam:   General:  NAD  Cardiovascular: s1, S2 RRR  Respiratory: CTA B  Abdomen: soft, nt, nd, bs+  Musculoskeletal: 1+ pitting edema bilaterally with  erythema, appears to be improving   Data Reviewed: Basic Metabolic Panel:  Recent Labs Lab 07/18/13 1810 07/19/13 0525 07/20/13 0605 07/21/13 0609 07/22/13 0634  NA 142 141 141 139 140  K 3.6* 4.1 3.7 4.0 4.0  CL 100 101 97 97 100  CO2 33* 34* 34* 36* 34*  GLUCOSE 109* 113* 117* 114* 112*  BUN 11 11 12 12 12   CREATININE 0.91 0.86 0.97 0.98 0.92  CALCIUM 9.0 9.0 9.7 9.4 9.1   Liver Function Tests: No results found for this basename: AST, ALT, ALKPHOS, BILITOT, PROT, ALBUMIN,  in the last 168 hours No results found for this basename: LIPASE, AMYLASE,  in the last 168 hours No results found for this basename: AMMONIA,  in the last 168 hours CBC:  Recent Labs Lab 07/18/13 1810 07/19/13 0525 07/22/13 0634  WBC 10.4 9.1 9.0  NEUTROABS 8.1*  --   --   HGB 13.2 13.7 13.2  HCT 40.8 41.9 40.6  MCV 80.5 80.3 79.9  PLT 109* 95* 104*   Cardiac Enzymes: No results found for this basename: CKTOTAL, CKMB, CKMBINDEX, TROPONINI,  in the last 168 hours BNP (last 3 results)  Recent Labs  05/25/13 1239  PROBNP 26.1   CBG: No results found for this basename: GLUCAP,  in the last 168 hours  No results found for this or any previous visit (from the past 240 hour(s)).   Studies: No results found.  Scheduled Meds: . atorvastatin  80 mg Oral QHS  . docusate sodium  100 mg Oral BID  .  dorzolamide  1 drop Both Eyes BID   And  . timolol  1 drop Both Eyes BID  . enoxaparin (LOVENOX) injection  40 mg Subcutaneous Q24H  . furosemide  80 mg Intravenous Q12H  . latanoprost  1 drop Both Eyes QHS  . metoprolol succinate  12.5 mg Oral q morning - 10a  . pantoprazole  40 mg Oral BID  . potassium chloride SA  20 mEq Oral BID  . sodium chloride  3 mL Intravenous Q12H  . tamsulosin  0.4 mg Oral QPC supper  . vancomycin  1,250 mg Intravenous Q12H  . 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 If 7PM-7AM, please contact night-coverage at [email or web address], password [facility] 07/22/2013, 6:08 PM  LOS: 4 days

## 2013-07-22 NOTE — Care Management Note (Addendum)
    Page 1 of 2   07/24/2013     9:34:16 AM   CARE MANAGEMENT NOTE 07/24/2013  Patient:  [name] [name], [name] [name]   Account Number:  [id number]  Date Initiated:  07/22/2013  Documentation initiated by:  [name] [name]  Subjective/Objective Assessment:   Pt lives at home with wife. Wife has hospice services. He is the caregiver for his wife. Anticipate DC home.     Action/Plan:   Anticipated DC Date:  07/24/2013   Anticipated DC Plan:  [place]  [name] consult      [facility] Choice  HOME HEALTH   Choice offered to / List presented to:  C-1 Patient        [place] arranged  HH-10 DISEASE MANAGEMENT  HH-1 RN      [place].   Status of service:  Completed, signed off Medicare Important Message given?  YES (If response is "NO", the following Medicare IM given date fields will be blank) Date Medicare IM given:  07/24/2013 Date Additional Medicare IM given:    Discharge Disposition:  [place]  Per UR Regulation:    If discussed at Long Length of Stay Meetings, dates discussed:   07/23/2013    Comments:  07/23/13 [name] [name] Pt referred to [facility]. Also arranged [place] with [facility] for RN services. Pt hopeful to DC today due to wife's health status.  07/22/13 [name] [name] [name] spoke with pt and g'daughter at bedside. [facility] RN wound care and evaluation requested.

## 2013-07-22 NOTE — Progress Notes (Signed)
ANTIBIOTIC CONSULT NOTE   Pharmacy Consult for Vancomycin Indication: cellulitis  Allergies  Allergen Reactions  . Aspirin Other (See Comments)    Nausea and upset stomach     Patient Measurements: Height: 5\' 8"  (172.7 cm) Weight: 296 lb 4.8 oz (134.4 kg) IBW/kg (Calculated) : 68.4  Vital Signs: Temp: 97.5 F (36.4 C) (03/02 0544) Temp src: Oral (03/02 0544) BP: 126/53 mmHg (03/02 0544) Pulse Rate: 76 (03/02 0544) Intake/Output from previous day: 03/01 0701 - 03/02 0700 In: 1450 [P.O.:1200; IV Piggyback:250] Out: 5284 [Urine:4005] Intake/Output from this shift:    Labs:  Recent Labs  07/20/13 0605 07/21/13 0609 07/22/13 0634  WBC  --   --  9.0  HGB  --   --  13.2  PLT  --   --  104*  CREATININE 0.97 0.98 0.92   Estimated Creatinine Clearance: 95.9 ml/min (by C-G formula based on Cr of 0.92).  Recent Labs  07/22/13 0634  VANCOTROUGH 19.9    Microbiology: No results found for this or any previous visit (from the past 720 hour(s)).  Medical History: Past Medical History  Diagnosis Date  . CHF (congestive heart failure)   . Obesity   . Hyperlipidemia   . PUD (peptic ulcer disease)   . GERD (gastroesophageal reflux disease)   . S/P endoscopy 2007, 2012    2007: nl, May 2012: antral erosions  . Osteoarthritis   . S/P colonoscopy 2007, Feb and March 2012    hx of adenomas, left-sided diverticula, On 07/2010 TCS, fresh blood and clot noted coming from TI.  Marland Kitchen Anxiety   . Thyroid disease     hypothyroid  . Ringing in ears   . History of kidney stones   . History of bladder infections   . Heel spur     right heel  . Collagen vascular disease     history of venous insufficency and LE cellulitis  . Thrombocytopenia 12/30/2011    Stable  . BPH (benign prostatic hyperplasia)   . Hypertension     Sees Dr. Debara Pickett  . Bilateral lower leg cellulitis   . Sleep apnea     uses oxygen 2L via [place] cannot tolerate CPAP  . Complication of anesthesia     due to sleep  apnea pt has had difficulty being put under anesthesia  . PONV (postoperative nausea and vomiting)    Medications:  Scheduled:  . atorvastatin  80 mg Oral QHS  . docusate sodium  100 mg Oral BID  . dorzolamide  1 drop Both Eyes BID   And  . timolol  1 drop Both Eyes BID  . enoxaparin (LOVENOX) injection  40 mg Subcutaneous Q24H  . furosemide  80 mg Intravenous Q12H  . latanoprost  1 drop Both Eyes QHS  . metoprolol succinate  12.5 mg Oral q morning - 10a  . pantoprazole  40 mg Oral BID  . potassium chloride SA  20 mEq Oral BID  . sodium chloride  3 mL Intravenous Q12H  . tamsulosin  0.4 mg Oral QPC supper  . vancomycin  1,250 mg Intravenous Q12H  . verapamil  120 mg Oral QHS   Assessment: 73 yo M with BLE swelling, cellulitis.  BLE are painful and weeping.  This has worsened over the past couple days.  Renal fxn is stable. He is afebrile with normal WBC.   Trough level is acceptable (despite being drawn ~ 90 minutes early) No culture data available to guide therapy.  Vancomycin 2/26>>  Goal of Therapy:  Vancomycin trough level 10-15 mcg/ml  Plan:  Continue Vancomycin 1250mg  IV q12h Check Vancomycin trough weekly for duration of Rx Transition to PO antibiotic therapy when indicated Monitor renal function and cx data   Hart Robinsons A 07/22/2013,8:32 AM

## 2013-07-23 LAB — BASIC METABOLIC PANEL
BUN: 13 mg/dL (ref 6–23)
CALCIUM: 9.3 mg/dL (ref 8.4–10.5)
CO2: 32 meq/L (ref 19–32)
CREATININE: 0.93 mg/dL (ref 0.50–1.35)
Chloride: 101 mEq/L (ref 96–112)
GFR calc Af Amer: >90 mL/min (ref >90)
GFR calc non Af Amer: 81 mL/min — ABNORMAL LOW (ref >90)
Glucose, Bld: 141 mg/dL — ABNORMAL HIGH (ref 70–99)
Potassium: 4 mEq/L (ref 3.7–5.3)
Sodium: 142 mEq/L (ref 137–147)

## 2013-07-23 LAB — URINE CULTURE
COLONY COUNT: NO GROWTH
CULTURE: NO GROWTH

## 2013-07-23 NOTE — Evaluation (Signed)
Occupational Therapy Evaluation Patient Details Name: [name] [name] MRN: [id number] DOB: 12-02-1940 Today's Date: 07/23/2013 Time: 4270-6237 OT Time Calculation (min): 18 min Eval 18'  OT Assessment / Plan / Recommendation History of present illness Pt is admitted due to bilateral LE cellulitis.  There was so much pain in his LEs with weight bearing that he was unable to ambulate.  He is a morbidly obese gentleman who had prostate surgery 2 weeks ago and had lumbar surgery this past fall for which he had to transfer to SNF in order to regain the ability to ambulate.  He lives with his wife who is under [place] and his son who looks after both of them.   Clinical Impression   Pt is presenting to acute OT with above situation. He is a a modified independent level with his ADLs and receives assist from his son for IADLs and caregiving duties for his wife. Pt states no concerns about being able to go home ans resume prior level of function with ADLs and IADLs. Pt does not need further OT services.    OT Assessment  Patient does not need any further OT services    Follow Up Recommendations  No OT follow up       Equipment Recommendations  None recommended by OT          Precautions / Restrictions Precautions Precautions: None Restrictions Weight Bearing Restrictions: No   Pertinent Vitals/Pain Pain at 6/10  - pt has just received pain meds and was deceased from 25/10.    ADL  ADL Comments: Pt is at a modified independent level with his ADL needs - he uses AE for lowerbody dressing and has an elevated toilet seat for easing tansfers.  Pt's son assist with IADLs - pt reports he is able to complete them, son just chooses to assist since he is available. Pt and son are primary caregivers for pt's wife, who is in hsopice  care. Pt does not currenlty state any concers about their ability to continue caring for her after his d/c.      Visit Information  Last OT Received On:  07/23/13 History of Present Illness: Pt is admitted due to bilateral LE cellulitis.  There was so much pain in his LEs with weight bearing that he was unable to ambulate.  He is a morbidly obese gentleman who had prostate surgery 2 weeks ago and had lumbar surgery this past fall for which he had to transfer to SNF in order to regain the ability to ambulate.  He lives with his wife who is under [place] and his son who looks after both of them.       Prior [place] expects to be discharged to:: Private residence Living Arrangements: Spouse/significant other;Children Available Help at Discharge: Family Type of Home: House Home Access: Stairs to enter Technical brewer of Steps: 3 Entrance Stairs-Rails: None Home Layout: One level Home Equipment: Environmental consultant - 2 wheels;Bedside commode;Shower seat Prior Function Level of Independence: Independent with assistive device(s) Communication Communication: No difficulties    Vision/Perception Vision - History Visual History: Cataracts;Glaucoma (Pt is schedule to have surgery for these conditions)   Cognition  Cognition Arousal/Alertness: Awake/alert Behavior During Therapy: WFL for tasks assessed/performed Overall Cognitive Status: Within Functional Limits for tasks assessed    Extremity/Trunk Assessment Upper Extremity Assessment Upper Extremity Assessment: Overall WFL for tasks assessed Lower Extremity Assessment Lower Extremity Assessment: Defer to PT evaluation  End of Session OT - End of Session Activity Tolerance: Patient tolerated treatment well Patient left: in chair;with call bell/phone within reach  [place], [place], OTR/L [phone]  07/23/2013, 1:29 PM

## 2013-07-23 NOTE — Progress Notes (Signed)
[facility] PROGRESS NOTE  [name] [name] XTK:[id number] DOB: [birth year] DOA: 07/18/2013 PCP: [name]., MD  Summary:  This patient was admitted to the hospital with bilateral lower extremity edema, erythema. He was felt to have an element of cellulitis as well as venous stasis. Patient has obstructive sleep apnea noncompliant with CPAP. He likely has an element of right-sided heart failure. He was started on intravenous Lasix and has had good urine output. His lower extremity edema is gradually improving. His volume status is -9 L and he continues to make. 2 L of urine per shift. He needs continued IV diuresis and eventually transition to by mouth. We'll continue current treatments. He will likely need another one to 2 days in the hospital.  Assessment/Plan: 1. Lower extremity cellulitis, bilateral. Improved. Afebrile normal WBC count. On doxycycline. 2. Acute on chronic diastolic congestive heart failure. Patient likely has right-sided heart failure due to pulmonary hypertension. He has a history of obstructive sleep apnea but has not been able to tolerate CPAP therapy. He is diuresing well with IV Lasix. We'll continue current treatments. Keep legs elevated. Echocardiogram was recently done and 04/2013 and will not be repeated. He continues to have evidence of volume overload and needs continued IV diuresis.  Continue fluid restriction. Volume status is currently -9 L and he continues to make greater than 2 L of urine per shift. Renal function is tolerating diuresis. Continue current management. 3. Thrombocytopenia, chronic. Patient is followed in hematology clinic. 4. Hypertension. Stable  Code Status: Full code Family Communication: discussed with patient  Disposition Plan: discharge home once improved   Consultants:    Procedures:    Antibiotics:  Vancomycin 2/26>>3/2  Doxycycline 3/2>>  HPI/Subjective: No new complaints. No shortness of breath or chest pain.  Feels edema is improving.  Objective: Filed Vitals:   07/23/13 1401  BP: 122/71  Pulse: 87  Temp: 97.3 F (36.3 C)  Resp: 20    Intake/Output Summary (Last 24 hours) at 07/23/13 1915 Last data filed at 07/23/13 1805  Gross per 24 hour  Intake   1200 ml  Output   4350 ml  Net  -3150 ml   Filed Weights   07/21/13 0500 07/22/13 0544 07/23/13 0406  Weight: 134.5 kg (296 lb 8.3 oz) 134.4 kg (296 lb 4.8 oz) 133.63 kg (294 lb 9.6 oz)    Exam:   General:  NAD  Cardiovascular: s1, S2 RRR  Respiratory: CTA B  Abdomen: soft, nt, nd, bs+  Musculoskeletal: 1+ pitting edema bilaterally with erythema, appears to be improving   Data Reviewed: Basic Metabolic Panel:  Recent Labs Lab 07/19/13 0525 07/20/13 0605 07/21/13 0609 07/22/13 0634 07/23/13 0531  NA 141 141 139 140 142  K 4.1 3.7 4.0 4.0 4.0  CL 101 97 97 100 101  CO2 34* 34* 36* 34* 32  GLUCOSE 113* 117* 114* 112* 141*  BUN 11 12 12 12 13   CREATININE 0.86 0.97 0.98 0.92 0.93  CALCIUM 9.0 9.7 9.4 9.1 9.3   Liver Function Tests: No results found for this basename: AST, ALT, ALKPHOS, BILITOT, PROT, ALBUMIN,  in the last 168 hours No results found for this basename: LIPASE, AMYLASE,  in the last 168 hours No results found for this basename: AMMONIA,  in the last 168 hours CBC:  Recent Labs Lab 07/18/13 1810 07/19/13 0525 07/22/13 0634  WBC 10.4 9.1 9.0  NEUTROABS 8.1*  --   --   HGB 13.2 13.7 13.2  HCT 40.8 41.9 40.6  MCV 80.5 80.3 79.9  PLT 109* 95* 104*   Cardiac Enzymes: No results found for this basename: CKTOTAL, CKMB, CKMBINDEX, TROPONINI,  in the last 168 hours BNP (last 3 results)  Recent Labs  05/25/13 1239  PROBNP 26.1   CBG: No results found for this basename: GLUCAP,  in the last 168 hours  No results found for this or any previous visit (from the past 240 hour(s)).   Studies: No results found.  Scheduled Meds: . atorvastatin  80 mg Oral QHS  . docusate sodium  100 mg Oral BID   . dorzolamide  1 drop Both Eyes BID   And  . timolol  1 drop Both Eyes BID  . doxycycline  100 mg Oral Q12H  . enoxaparin (LOVENOX) injection  40 mg Subcutaneous Q24H  . furosemide  80 mg Intravenous Q12H  . latanoprost  1 drop Both Eyes QHS  . metoprolol succinate  12.5 mg Oral q morning - 10a  . pantoprazole  40 mg Oral BID  . potassium chloride SA  20 mEq Oral BID  . sodium chloride  3 mL Intravenous Q12H  . tamsulosin  0.4 mg Oral QPC supper  . 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 If 7PM-7AM, please contact night-coverage at [email or web address], password [facility] 07/23/2013, 7:15 PM  LOS: 5 days

## 2013-07-23 NOTE — Progress Notes (Signed)
Nutrition Brief Note  Patient identified on the Malnutrition Screening Tool (MST) Report  Wt Readings from Last 15 Encounters:  07/23/13 294 lb 9.6 oz (133.63 kg)  07/17/13 308 lb 9.6 oz (139.98 kg)  06/18/13 296 lb (134.265 kg)  06/18/13 296 lb (134.265 kg)  06/14/13 296 lb 3.2 oz (134.355 kg)  06/12/13 285 lb (129.275 kg)  06/09/13 275 lb (124.739 kg)  05/25/13 285 lb (129.275 kg)  05/14/13 288 lb (130.636 kg)  05/04/13 289 lb 0.4 oz (131.1 kg)  01/02/13 279 lb 14.4 oz (126.962 kg)  11/27/12 282 lb 3.2 oz (128.005 kg)  08/22/12 285 lb 4.4 oz (129.4 kg)  08/22/12 285 lb 4.4 oz (129.4 kg)  08/21/12 284 lb 2.8 oz (128.9 kg)    Body mass index is 44.8 kg/(m^2). Patient meets criteria for extreme obesity, class III based on current BMI.   Current diet order is Heart Healthy, patient is consuming approximately 100% of meals at this time. Labs and medications reviewed.   No nutrition interventions warranted at this time. If nutrition issues arise, please consult RD.   [name] [name] A. Jimmye Norman, RD, LDN Pager: [phone]

## 2013-07-24 LAB — BASIC METABOLIC PANEL
BUN: 16 mg/dL (ref 6–23)
CALCIUM: 9.4 mg/dL (ref 8.4–10.5)
CO2: 34 mEq/L — ABNORMAL HIGH (ref 19–32)
Chloride: 100 mEq/L (ref 96–112)
Creatinine, Ser: 1.06 mg/dL (ref 0.50–1.35)
GFR calc Af Amer: 78 mL/min — ABNORMAL LOW (ref >90)
GFR calc non Af Amer: 68 mL/min — ABNORMAL LOW (ref >90)
Glucose, Bld: 111 mg/dL — ABNORMAL HIGH (ref 70–99)
POTASSIUM: 4.3 meq/L (ref 3.7–5.3)
Sodium: 141 mEq/L (ref 137–147)

## 2013-07-24 MED ORDER — DOXYCYCLINE HYCLATE 100 MG PO TABS: 100.0000 mg | ORAL_TABLET | Freq: Two times a day (BID) | ORAL | Status: DC

## 2013-07-24 NOTE — Discharge Summary (Signed)
Physician Discharge Summary  [name] [name] [id number] DOB: 1940/12/10 DOA: 07/18/2013  PCP: [name]., MD  Admit date: 07/18/2013 Discharge date: 07/24/2013  Recommendations for Outpatient Follow-up:  1. Acute on chronic right-sided heart failure 2. Home health RN for disease management  Follow-up Information   Follow up with [place]. [facility] RN)    Contact information:   [place] [id number] [phone]      Follow up with [name]., MD In 2 weeks.   Specialty:  Internal Medicine   Contact information:   [id number]  [place] [address] [phone]       Follow up with Erlene Quan, PA-C On 07/30/2013. (4 PM)    Specialty:  Cardiology   Contact information:   [address] [place] [address] [phone]      Discharge Diagnoses:  1. Bilateral lower extremity cellulitis 2. Acute on chronic right-sided congestive heart failure, suspected pulmonary hypertension 3. Obstructive sleep apnea, noncompliant with CPAP  Discharge Condition: Improved Disposition: Home with home health RN  Diet recommendation: Heart healthy  Filed Weights   07/22/13 0544 07/23/13 0406 07/24/13 0500  Weight: 134.4 kg (296 lb 4.8 oz) 133.63 kg (294 lb 9.6 oz) 133.584 kg (294 lb 8 oz)    History of present illness:  73 year old man with history of right sided congestive heart failure presented with history of increasing lower extremity edema and weight gain despite Lasix as well as some bilateral leg pain. Admitted for bilateral lower extremity cellulitis, acute on chronic heart failure right-sided thought to be secondary to pulmonary hypertension.   Hospital Course:  Lower extremity cellulitis rapidly improved within empiric Vancomycin and was changed to oral doxycycline. No evidence of complicating features, appears resolved at this point. Acute on chronic right-sided heart failure with volume overload  responded to IV diuresis with Lasix with a -11.5 L since admission. There is minimal residual edema which is chronic in nature. No pulmonary symptoms. Creatinine for further. Stable for discharge. Close outpatient followup has been arranged. HH, [facility] as well.  1. Bilateral lower extremity cellulitis. Appears clinically resolved. No fluid collection. Superimposed on chronic venous stasis bilateral. Continue doxycycline. 2. Acute on chronic CHF, right-sided (see cardiology note from January of this year), suspected pulmonary hypertension likely related to obstructive sleep apnea, noncompliant with CPAP. Appears well compensated at this point with excellent diuresis and minimal lower extremity edema. No pulmonary symptoms.. History of mild coronary artery disease by catheterization 2000 and. Chronic lower extremity edema noted per chart review, last seen by cardiology 05/2013 at which time had 2-3+ BLE to the knees. 3. OSA, oxygen 2L [place], cannot toelrate CPAP. Stable. 4. Thrombocytopenia and leukocytosis. Chronic stable over years. Followed by hematology. Repeat office visit in one year as planned. 5. S/p TURP 05/2013  Consultants:  Physical therapy. No followup needed.  Occupational therapy. No followup. Procedures:  2-D echocardiogram December 2014: LVEF 65-70%. Normal wall motion. Grade 1 diastolic dysfunction. Antibiotics:  Doxycycline 3/2 >> 3/7  Vancomycin 2/26 >> 3/2  Discharge Instructions  Discharge Orders   Future Appointments Provider Department Dept Phone   07/30/2013 4:00 PM Ilean [facility] [id number]   08/08/2013 3:00 PM Pixie Casino, MD [facility] [phone]   11/12/2013 10:30 AM Philmore Pali, NP [facility] [phone]   07/17/2014 10:20 AM [place] [phone]   07/17/2014 10:30 AM Baird Cancer, PA-C [facility]  [place] [phone]   Future Orders Complete By Expires   (HEART  FAILURE PATIENTS) Call MD:  Anytime you have any of the following symptoms: 1) 3 pound weight gain in 24 hours or 5 pounds in 1 week 2) shortness of breath, with or without a dry hacking cough 3) swelling in the hands, feet or stomach 4) if you have to sleep on extra pillows at night in order to breathe.  As directed    Diet - low sodium heart healthy  As directed    Discharge instructions  As directed    Comments:     Be sure to complete antibiotics as instructed. Followup with your cardiology team next week as recommended 3/10 at 4 PM   Increase activity slowly  As directed        Medication List         acetaminophen 325 MG tablet  Commonly known as:  TYLENOL  Take 325-650 mg by mouth every 6 (six) hours as needed for pain.     atorvastatin 80 MG tablet  Commonly known as:  LIPITOR  Take 80 mg by mouth at bedtime.     cetirizine 10 MG tablet  Commonly known as:  ZYRTEC  Take 10 mg by mouth daily.     cyclobenzaprine 10 MG tablet  Commonly known as:  FLEXERIL  Take 1 tablet (10 mg total) by mouth 3 (three) times daily as needed for muscle spasms.     docusate sodium 100 MG capsule  Commonly known as:  COLACE  Take 100 mg by mouth daily as needed for mild constipation.     Dorzolamide HCl-Timolol Mal PF 22.3-6.8 MG/ML Soln  Apply 1 drop to eye 2 (two) times daily. Dorzolamide HCI-Timolol Maleate Ophthalmic Solution 2.23%/0.68%     doxycycline 100 MG tablet  Commonly known as:  VIBRA-TABS  Take 1 tablet (100 mg total) by mouth every 12 (twelve) hours.     furosemide 80 MG tablet  Commonly known as:  LASIX  Take 80 mg by mouth 3 (three) times daily.     latanoprost 0.005 % ophthalmic solution  Commonly known as:  XALATAN  Place 1 drop into both eyes at bedtime.     LUBRICANT EYE DROPS 0.4-0.3 % Soln  Generic drug:  Polyethyl Glycol-Propyl Glycol  Apply 1 drop to eye daily as needed (for lubricant eye drops).     metoprolol succinate 25 MG 24 hr tablet  Commonly  known as:  TOPROL-XL  Take 0.5 tablets (12.5 mg total) by mouth every morning.     oxyCODONE-acetaminophen 10-325 MG per tablet  Commonly known as:  PERCOCET  Take 1 tablet by mouth every 6 (six) hours as needed for pain.     pantoprazole 40 MG tablet  Commonly known as:  PROTONIX  Take 40 mg by mouth 2 (two) times daily.     potassium chloride SA 20 MEQ tablet  Commonly known as:  K-DUR,KLOR-CON  Take 1 tablet (20 mEq total) by mouth 2 (two) times daily.     tamsulosin 0.4 MG Caps capsule  Commonly known as:  FLOMAX  Take 0.4 mg by mouth daily after supper.     verapamil 120 MG CR tablet  Commonly known as:  CALAN-SR  Take 1 tablet (120 mg total) by mouth at bedtime.       Allergies  Allergen Reactions  . Aspirin Other (See Comments)    Nausea and upset stomach      The results  of significant diagnostics from this hospitalization (including imaging, microbiology, ancillary and laboratory) are listed below for reference.    Significant Diagnostic Studies: US Venous Img Lower Bilateral  07/19/2013   CLINICAL DATA:  Bilateral leg swelling  EXAM: Bilateral LOWER EXTREMITY VENOUS DOPPLER ULTRASOUND  TECHNIQUE: Gray-scale sonography with graded compression, as well as color Doppler and duplex ultrasound, were performed to evaluate the deep venous system from the level of the common femoral vein through the popliteal and proximal calf veins. Spectral Doppler was utilized to evaluate flow at rest and with distal augmentation maneuvers.  COMPARISON:  Prior DVT ultrasound 06/15/2012  FINDINGS: Thrombus within deep veins: The bilateral lower extremity veins are well imaged through the popliteal and posterior tibial veins. Secondary to patient body habitus, right calf bandage material and superficial subcutaneous edema, the peroneal veins are very poorly visualized. No evidence of a thrombus within the visualized veins.  Compressibility of deep veins:  Normal.  Duplex waveform respiratory  phasicity:  Normal.  Duplex waveform response to augmentation:  Normal.  Venous reflux:  None visualized.  Other findings: The superficial great saphenous vein is patent and compressible bilaterally.  IMPRESSION: Negative for acute DVT.   Electronically Signed   By: Jacqulynn Cadet M.D.   On: 07/19/2013 09:46    Microbiology: Recent Results (from the past 240 hour(s))  URINE CULTURE     Status: None   Collection Time    07/22/13  5:42 PM      Result Value Ref Range Status   Specimen Description URINE, CLEAN CATCH   Final   Special Requests NONE   Final   Culture  Setup Time     Final   Value: 07/22/2013 18:45     Performed at [place]     Final   Value: NO GROWTH     Performed at [facility]   Culture     Final   Value: NO GROWTH     Performed at [facility]   Report Status 07/23/2013 FINAL   Final     Labs: Basic Metabolic Panel:  Recent Labs Lab 07/20/13 0605 07/21/13 0609 07/22/13 0634 07/23/13 0531 07/24/13 0548  NA 141 139 140 142 141  K 3.7 4.0 4.0 4.0 4.3  CL 97 97 100 101 100  CO2 34* 36* 34* 32 34*  GLUCOSE 117* 114* 112* 141* 111*  BUN 12 12 12 13 16   CREATININE 0.97 0.98 0.92 0.93 1.06  CALCIUM 9.7 9.4 9.1 9.3 9.4   CBC:  Recent Labs Lab 07/18/13 1810 07/19/13 0525 07/22/13 0634  WBC 10.4 9.1 9.0  NEUTROABS 8.1*  --   --   HGB 13.2 13.7 13.2  HCT 40.8 41.9 40.6  MCV 80.5 80.3 79.9  PLT 109* 95* 104*     Recent Labs  05/25/13 1239  PROBNP 26.1   Principal Problem:   Bilateral lower leg cellulitis Active Problems:   HYPERTENSION   Hypothyroidism   Thrombocytopenia   Right heart failure   OSA (obstructive sleep apnea)   CAD, minor disease, 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

## 2013-07-24 NOTE — Progress Notes (Signed)
PROGRESS NOTE  [name] [name] ELF:[id number] DOB: 1940-06-12 DOA: 07/18/2013 PCP: [name]., MD Cardiologist: Dr Debara Pickett  Summary: 73 year old man with history of right sided congestive heart failure presented with history of increasing lower extremity edema and weight gain despite Lasix as well as some bilateral leg pain. Admitted for bilateral lower extremity cellulitis, acute on chronic heart failure right-sided thought to be secondary to pulmonary hypertension. He was diuresed with Lasix with good response.  Assessment/Plan: 1. Bilateral lower extremity cellulitis. Appears clinically resolved. No fluid collection. Superimposed on chronic venous stasis bilateral. Continue doxycycline. 2. Acute on chronic CHF, right-sided (see cardiology note from January of this year), suspected pulmonary hypertension likely related to obstructive sleep apnea, noncompliant with CPAP. Appears well compensated at this point with excellent diuresis and minimal lower extremity edema. No pulmonary symptoms.. History of mild coronary artery disease by catheterization 2000 and. Chronic lower extremity edema noted per chart review, last seen by cardiology 05/2013 at which time had 2-3+ BLE to the knees. 3. OSA, oxygen 2L [place], cannot toelrate CPAP. Stable. 4. Thrombocytopenia and leukocytosis. Chronic stable over years. Followed by hematology. Repeat office visit in one year as planned. 5. S/p TURP 05/2013   Change to oral diuretics. Close outpatient followup with cardiology has been arranged for 3/10 4 PM with [facility].  Home with home health RN for disease management  Murray Hodgkins, MD  [facility]  Pager [phone] If 7PM-7AM, please contact night-coverage at [email or web address], password [facility] 07/24/2013, 11:48 AM  LOS: 6 days   Consultants:  Physical therapy. No followup needed.   Occupational therapy. No followup.  Procedures:  2-D echocardiogram December 2014: LVEF 65-70%. Normal wall motion.  Grade 1 diastolic dysfunction.  Antibiotics:  Doxycycline 3/2 >> 3/7  Vancomycin 2/26 >> 3/2  HPI/Subjective: "I feel much better". Minimal pain right leg which is chronic, intermittent. No complaints. Wants to go home.  Objective: Filed Vitals:   07/23/13 1105 07/23/13 1401 07/23/13 2114 07/24/13 0500  BP: 110/64 122/71 128/70 100/59  Pulse:  87 79 77  Temp:  97.3 F (36.3 C) 97.6 F (36.4 C) 97.1 F (36.2 C)  TempSrc:  Oral Oral Oral  Resp:  20 20 14   Height:      Weight:    133.584 kg (294 lb 8 oz)  SpO2:  93% 95% 94%    Intake/Output Summary (Last 24 hours) at 07/24/13 1148 Last data filed at 07/24/13 0855  Gross per 24 hour  Intake    720 ml  Output   3700 ml  Net  -2980 ml     Filed Weights   07/22/13 0544 07/23/13 0406 07/24/13 0500  Weight: 134.4 kg (296 lb 4.8 oz) 133.63 kg (294 lb 9.6 oz) 133.584 kg (294 lb 8 oz)    Exam:   Afebrile, vital signs stable. No hypoxia.  Gen. Appears calm and comfortable. Speech fluent and clear.  Cardiovascular regular rate and rhythm. No murmur, rub or gallop. 1+ bilateral lower extremity edema. Dorsalis pedis pulses 2+ bilaterally. No pain in the feet. Excellent perfusion in toes bilaterally. Brisk capillary refill.  Respiratory clear to auscultation bilaterally. No wheezes, rales or rhonchi. Normal respiratory effort.  Psychiatric grossly normal mood and affect. Speech fluent and appropriate.  Data Reviewed:  Weight without significant change since admission, perhaps down 2-3 kg; however patient -11.5 L since admission. Urine output 4 L over the last 24 hours.   Basic metabolic panel unremarkable. BUN and creatinine normal.    CBC unremarkable except  for stable thrombocytopenia, 104.    urine culture no growth, final   Bilateral lower extremity Dopplers negative for DVT  Scheduled Meds: . atorvastatin  80 mg Oral QHS  . docusate sodium  100 mg Oral BID  . dorzolamide  1 drop Both Eyes BID   And  .  timolol  1 drop Both Eyes BID  . doxycycline  100 mg Oral Q12H  . enoxaparin (LOVENOX) injection  40 mg Subcutaneous Q24H  . furosemide  80 mg Intravenous Q12H  . latanoprost  1 drop Both Eyes QHS  . metoprolol succinate  12.5 mg Oral q morning - 10a  . pantoprazole  40 mg Oral BID  . potassium chloride SA  20 mEq Oral BID  . sodium chloride  3 mL Intravenous Q12H  . tamsulosin  0.4 mg Oral QPC supper  . 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

## 2013-07-24 NOTE — Progress Notes (Signed)
IV removed, site WNL.  Pt given d/c instructions and new prescriptions.  Discussed all home medications (when, how, and why to take), patient verbalizes understanding. Pt given scale to use for daily weights, CHF education done.  Patient has [place] RN and [facility] arranged to home follow-up.  Discussed home care with patient, teachback completed. F/U appointment with cardiology in place, pt states they will keep appointment. Appt for PCP to be made by patient. Pt is stable at this time and very much desires to be discharged.  Pt has two family members with his during d/c instructions. Pt taken to main entrance in wheelchair by staff member.

## 2013-07-24 NOTE — Progress Notes (Signed)
Patient evaluated for community based chronic disease management services with [facility] as a benefit of patient's [employer]. Patient spoke with inpatient RNCM at bedside to explain [facility] services.  Service engagement has been accepted.  Patient will receive a post discharge transition of care call and will be evaluated for monthly home visits for assessments and CHF disease process education.  Attempted to reach the patient via phone this am without success.  Let message at nurses station requesting call back. Made Inpatient Case Manager aware that [facility] following. Of note, [facility] Care Management services does not replace or interfere with any services that are arranged by inpatient case management or social work.  For additional questions or referrals please contact Corliss Blacker BSN RN [facility] Liaison at [phone].

## 2013-07-26 ENCOUNTER — Telehealth: Payer: Self-pay | Admitting: Internal Medicine

## 2013-07-26 DIAGNOSIS — L97209 Non-pressure chronic ulcer of unspecified calf with unspecified severity: Secondary | ICD-10-CM | POA: Diagnosis not present

## 2013-07-26 DIAGNOSIS — L02419 Cutaneous abscess of limb, unspecified: Secondary | ICD-10-CM | POA: Diagnosis not present

## 2013-07-26 DIAGNOSIS — I10 Essential (primary) hypertension: Secondary | ICD-10-CM | POA: Diagnosis not present

## 2013-07-26 DIAGNOSIS — I872 Venous insufficiency (chronic) (peripheral): Secondary | ICD-10-CM | POA: Diagnosis not present

## 2013-07-26 DIAGNOSIS — I509 Heart failure, unspecified: Secondary | ICD-10-CM | POA: Diagnosis not present

## 2013-07-26 DIAGNOSIS — G4733 Obstructive sleep apnea (adult) (pediatric): Secondary | ICD-10-CM | POA: Diagnosis not present

## 2013-07-26 DIAGNOSIS — Z9981 Dependence on supplemental oxygen: Secondary | ICD-10-CM | POA: Diagnosis not present

## 2013-07-26 DIAGNOSIS — I2789 Other specified pulmonary heart diseases: Secondary | ICD-10-CM | POA: Diagnosis not present

## 2013-07-26 NOTE — Telephone Encounter (Signed)
Spoke w/ Verdis Frederickson w/ [facility].  Stated pt was admitted to [facility] for heart failure.  Stated pt was discharged from [facility] w/ verapamil on his med list, but pt stated he doesn't know anything about it.  Informed it appears it was prescribed by Dr. Leta Baptist, neurology.  Advised that office be contacted r/t this med.  Verbalized understanding.  Also asked it any record of pt taking bumetanide and informed it is not on pt's current med list.  Verbalized understanding.

## 2013-07-29 DIAGNOSIS — I872 Venous insufficiency (chronic) (peripheral): Secondary | ICD-10-CM | POA: Diagnosis not present

## 2013-07-29 DIAGNOSIS — I509 Heart failure, unspecified: Secondary | ICD-10-CM | POA: Diagnosis not present

## 2013-07-29 DIAGNOSIS — L02419 Cutaneous abscess of limb, unspecified: Secondary | ICD-10-CM | POA: Diagnosis not present

## 2013-07-29 DIAGNOSIS — L03119 Cellulitis of unspecified part of limb: Secondary | ICD-10-CM | POA: Diagnosis not present

## 2013-07-29 DIAGNOSIS — L97209 Non-pressure chronic ulcer of unspecified calf with unspecified severity: Secondary | ICD-10-CM | POA: Diagnosis not present

## 2013-07-29 DIAGNOSIS — I2789 Other specified pulmonary heart diseases: Secondary | ICD-10-CM | POA: Diagnosis not present

## 2013-07-29 DIAGNOSIS — G4733 Obstructive sleep apnea (adult) (pediatric): Secondary | ICD-10-CM | POA: Diagnosis not present

## 2013-07-30 ENCOUNTER — Ambulatory Visit (INDEPENDENT_AMBULATORY_CARE_PROVIDER_SITE_OTHER): Payer: Medicare Other | Admitting: Cardiology

## 2013-07-30 ENCOUNTER — Encounter: Payer: Self-pay | Admitting: Cardiology

## 2013-07-30 VITALS — BP 110/60 | HR 88 | Ht 69.0 in | Wt 303.4 lb

## 2013-07-30 DIAGNOSIS — I509 Heart failure, unspecified: Secondary | ICD-10-CM | POA: Diagnosis not present

## 2013-07-30 DIAGNOSIS — I2510 Atherosclerotic heart disease of native coronary artery without angina pectoris: Secondary | ICD-10-CM

## 2013-07-30 DIAGNOSIS — I5189 Other ill-defined heart diseases: Secondary | ICD-10-CM

## 2013-07-30 DIAGNOSIS — R609 Edema, unspecified: Secondary | ICD-10-CM

## 2013-07-30 DIAGNOSIS — I10 Essential (primary) hypertension: Secondary | ICD-10-CM

## 2013-07-30 DIAGNOSIS — G4733 Obstructive sleep apnea (adult) (pediatric): Secondary | ICD-10-CM

## 2013-07-30 DIAGNOSIS — I519 Heart disease, unspecified: Secondary | ICD-10-CM | POA: Diagnosis not present

## 2013-07-30 DIAGNOSIS — I5033 Acute on chronic diastolic (congestive) heart failure: Secondary | ICD-10-CM

## 2013-07-30 DIAGNOSIS — R600 Localized edema: Secondary | ICD-10-CM

## 2013-07-30 NOTE — Patient Instructions (Signed)
Dr Debara Pickett to see in April Your physician has recommended that you have a sleep study. This test records several body functions during sleep, including: brain activity, eye movement, oxygen and carbon dioxide blood levels, heart rate and rhythm, breathing rate and rhythm, the flow of air through your mouth and nose, snoring, body muscle movements, and chest and belly movement.

## 2013-07-30 NOTE — Progress Notes (Signed)
07/30/2013 [name] [name]   [birth year]  [id number]  Primary Physicia [name]., MD Primary Cardiologist: Dr Debara Pickett  HPI:  73 y/o followed by Dr Rollene Fare in the past, now Dr Debara Pickett. 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 He has chronic lower extremity edema which has been attributed to chronic Rt heart failure. He did have venous studies done and was not felt to have incompetent veins.  He was admitted in December 2014 with Rt sided weakness and an abnormal CT scan but an MRI did not reveal any stroke. MRA of his neck revealed no carotid disease. Echo showed an EF of 65-70% with grade 1 diastolic dysfunction. He does have morbid obesity and sleep apnea. He is on O2 at night, he could not tolerate C-pap.             He was recently admitted to [facility] with acute on chronic diastolic CHF. He says he lost 14 lbs. Since discharge he has been back to baseline. He tells me his wife of 18 yrs died last week. He seems to be taking it well, she apparently was chronically ill.     Current Outpatient Prescriptions  Medication Sig Dispense Refill  . acetaminophen (TYLENOL) 325 MG tablet Take 325-650 mg by mouth every 6 (six) hours as needed for pain.       Marland Kitchen atorvastatin (LIPITOR) 80 MG tablet Take 80 mg by mouth at bedtime.      . cetirizine (ZYRTEC) 10 MG tablet Take 10 mg by mouth daily.      . cyclobenzaprine (FLEXERIL) 10 MG tablet Take 1 tablet (10 mg total) by mouth 3 (three) times daily as needed for muscle spasms.  45 tablet  0  . docusate sodium (COLACE) 100 MG capsule Take 100 mg by mouth daily as needed for mild constipation.       . Dorzolamide HCl-Timolol Mal PF 22.3-6.8 MG/ML SOLN Apply 1 drop to eye 2 (two) times daily. Dorzolamide HCI-Timolol Maleate Ophthalmic Solution 2.23%/0.68%      . doxycycline (VIBRA-TABS) 100 MG tablet Take 1 tablet (100 mg total) by mouth every 12 (twelve) hours.  6 tablet  0  . furosemide (LASIX) 80 MG tablet Take 80 mg by mouth 3 (three) times  daily.       Marland Kitchen latanoprost (XALATAN) 0.005 % ophthalmic solution Place 1 drop into both eyes at bedtime.      . metoprolol succinate (TOPROL-XL) 25 MG 24 hr tablet Take 0.5 tablets (12.5 mg total) by mouth every morning.  30 tablet  11  . oxyCODONE-acetaminophen (PERCOCET) 10-325 MG per tablet Take 1 tablet by mouth every 6 (six) hours as needed for pain.  70 tablet  0  . pantoprazole (PROTONIX) 40 MG tablet Take 40 mg by mouth 2 (two) times daily.      Vladimir Faster Glycol-Propyl Glycol (LUBRICANT EYE DROPS) 0.4-0.3 % SOLN Apply 1 drop to eye daily as needed (for lubricant eye drops).      . potassium chloride SA (K-DUR,KLOR-CON) 20 MEQ tablet Take 1 tablet (20 mEq total) by mouth 2 (two) times daily.  60 tablet  0  . Tamsulosin HCl (FLOMAX) 0.4 MG CAPS Take 0.4 mg by mouth daily after supper.       . verapamil (CALAN-SR) 120 MG CR tablet Take 1 tablet (120 mg total) by mouth at bedtime.  30 tablet  12   No current facility-administered medications for this visit.    Allergies  Allergen Reactions  . Aspirin Other (See Comments)    Nausea and upset stomach      History   Social History  . Marital Status: Married    Spouse Name: LaRuth    Number of Children: 5  . Years of Education: College   Occupational History  . disability    Social History Main Topics  . Smoking status: Former Smoker -- 8 years    Types: Cigars    Quit date: 08/10/1973  . Smokeless tobacco: Former Systems developer    Quit date: 08/10/1973     Comment: Pt reports intermittent smoking  . Alcohol Use: No  . Drug Use: No  . Sexual Activity: No   Other Topics Concern  . Not on file   Social History Narrative   Patient lives at home with spouse and son.   Caffeine Use: 2 cups weekly     Review of Systems: General: negative for chills, fever, night sweats or weight changes.  Cardiovascular: negative for chest pain, dyspnea on exertion, edema, orthopnea, palpitations, paroxysmal nocturnal dyspnea or shortness of  breath Dermatological: negative for rash Respiratory: negative for cough or wheezing Urologic: negative for hematuria Abdominal: negative for nausea, vomiting, diarrhea, bright red blood per rectum, melena, or hematemesis Neurologic: negative for visual changes, syncope, or dizziness All other systems reviewed and are otherwise negative except as noted above.    Blood pressure 110/60, pulse 88, height 5\' 9"  (1.753 m), weight 303 lb 6.4 oz (137.621 kg).  General appearance: alert, cooperative, no distress and morbidly obese Lungs: clear to auscultation bilaterally Heart: regular rate and rhythm Extremities: 3+ bilat edema   ASSESSMENT AND PLAN:   Acute on chronic diastolic CHF Admitted 4/82/70-/11/27/65. Diuresed 14 lbs.  Diastolic dysfunction by echo Dec 2014 .  OSA (obstructive sleep apnea) He would like to try C-pap again, last sleep study 4 yrs ago, will repeat   Morbid obesity- BMI 44.5 .  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  HYPERTENSION .  Edema of both legs-chronic .   PLAN  I will order a sleep study for [name]. He will monitor his wgt, an advanced HHRN is coming to his house. He will follow up with Dr Debara Pickett in a month.  [facility] KPA-C 07/30/2013 4:46 PM

## 2013-07-30 NOTE — Assessment & Plan Note (Signed)
He would like to try C-pap again, last sleep study 4 yrs ago, will repeat

## 2013-07-30 NOTE — Assessment & Plan Note (Signed)
Admitted 07/18/13-/07/22/13. Diuresed 14 lbs.

## 2013-07-31 DIAGNOSIS — E785 Hyperlipidemia, unspecified: Secondary | ICD-10-CM | POA: Diagnosis not present

## 2013-07-31 DIAGNOSIS — I10 Essential (primary) hypertension: Secondary | ICD-10-CM | POA: Diagnosis not present

## 2013-07-31 DIAGNOSIS — E669 Obesity, unspecified: Secondary | ICD-10-CM | POA: Diagnosis not present

## 2013-07-31 DIAGNOSIS — I509 Heart failure, unspecified: Secondary | ICD-10-CM | POA: Diagnosis not present

## 2013-08-02 ENCOUNTER — Encounter: Payer: Self-pay | Admitting: *Deleted

## 2013-08-03 DIAGNOSIS — I872 Venous insufficiency (chronic) (peripheral): Secondary | ICD-10-CM | POA: Diagnosis not present

## 2013-08-03 DIAGNOSIS — I2789 Other specified pulmonary heart diseases: Secondary | ICD-10-CM | POA: Diagnosis not present

## 2013-08-03 DIAGNOSIS — L02419 Cutaneous abscess of limb, unspecified: Secondary | ICD-10-CM | POA: Diagnosis not present

## 2013-08-03 DIAGNOSIS — I509 Heart failure, unspecified: Secondary | ICD-10-CM | POA: Diagnosis not present

## 2013-08-03 DIAGNOSIS — G4733 Obstructive sleep apnea (adult) (pediatric): Secondary | ICD-10-CM | POA: Diagnosis not present

## 2013-08-03 DIAGNOSIS — L97209 Non-pressure chronic ulcer of unspecified calf with unspecified severity: Secondary | ICD-10-CM | POA: Diagnosis not present

## 2013-08-05 DIAGNOSIS — I872 Venous insufficiency (chronic) (peripheral): Secondary | ICD-10-CM | POA: Diagnosis not present

## 2013-08-05 DIAGNOSIS — L02419 Cutaneous abscess of limb, unspecified: Secondary | ICD-10-CM | POA: Diagnosis not present

## 2013-08-05 DIAGNOSIS — I2789 Other specified pulmonary heart diseases: Secondary | ICD-10-CM | POA: Diagnosis not present

## 2013-08-05 DIAGNOSIS — L97209 Non-pressure chronic ulcer of unspecified calf with unspecified severity: Secondary | ICD-10-CM | POA: Diagnosis not present

## 2013-08-05 DIAGNOSIS — I509 Heart failure, unspecified: Secondary | ICD-10-CM | POA: Diagnosis not present

## 2013-08-05 DIAGNOSIS — G4733 Obstructive sleep apnea (adult) (pediatric): Secondary | ICD-10-CM | POA: Diagnosis not present

## 2013-08-07 ENCOUNTER — Telehealth: Payer: Self-pay | Admitting: Cardiology

## 2013-08-07 DIAGNOSIS — G4733 Obstructive sleep apnea (adult) (pediatric): Secondary | ICD-10-CM | POA: Diagnosis not present

## 2013-08-07 DIAGNOSIS — L97209 Non-pressure chronic ulcer of unspecified calf with unspecified severity: Secondary | ICD-10-CM | POA: Diagnosis not present

## 2013-08-07 DIAGNOSIS — I2789 Other specified pulmonary heart diseases: Secondary | ICD-10-CM | POA: Diagnosis not present

## 2013-08-07 DIAGNOSIS — I872 Venous insufficiency (chronic) (peripheral): Secondary | ICD-10-CM | POA: Diagnosis not present

## 2013-08-07 DIAGNOSIS — L02419 Cutaneous abscess of limb, unspecified: Secondary | ICD-10-CM | POA: Diagnosis not present

## 2013-08-07 DIAGNOSIS — I509 Heart failure, unspecified: Secondary | ICD-10-CM | POA: Diagnosis not present

## 2013-08-07 DIAGNOSIS — L03119 Cellulitis of unspecified part of limb: Secondary | ICD-10-CM | POA: Diagnosis not present

## 2013-08-07 NOTE — Telephone Encounter (Signed)
07-31-13  Faxed records and demographics to [place] [facility]

## 2013-08-08 ENCOUNTER — Ambulatory Visit: Payer: Medicare Other | Admitting: Internal Medicine

## 2013-08-08 DIAGNOSIS — G4710 Hypersomnia, unspecified: Secondary | ICD-10-CM | POA: Diagnosis not present

## 2013-08-14 DIAGNOSIS — L97209 Non-pressure chronic ulcer of unspecified calf with unspecified severity: Secondary | ICD-10-CM | POA: Diagnosis not present

## 2013-08-14 DIAGNOSIS — I2789 Other specified pulmonary heart diseases: Secondary | ICD-10-CM | POA: Diagnosis not present

## 2013-08-14 DIAGNOSIS — G4733 Obstructive sleep apnea (adult) (pediatric): Secondary | ICD-10-CM | POA: Diagnosis not present

## 2013-08-14 DIAGNOSIS — L02419 Cutaneous abscess of limb, unspecified: Secondary | ICD-10-CM | POA: Diagnosis not present

## 2013-08-14 DIAGNOSIS — L03119 Cellulitis of unspecified part of limb: Secondary | ICD-10-CM | POA: Diagnosis not present

## 2013-08-14 DIAGNOSIS — I509 Heart failure, unspecified: Secondary | ICD-10-CM | POA: Diagnosis not present

## 2013-08-14 DIAGNOSIS — I872 Venous insufficiency (chronic) (peripheral): Secondary | ICD-10-CM | POA: Diagnosis not present

## 2013-08-20 ENCOUNTER — Other Ambulatory Visit: Payer: Self-pay

## 2013-08-20 ENCOUNTER — Encounter (HOSPITAL_COMMUNITY): Payer: Self-pay | Admitting: Emergency Medicine

## 2013-08-20 ENCOUNTER — Emergency Department (HOSPITAL_COMMUNITY): Payer: Medicare Other

## 2013-08-20 ENCOUNTER — Inpatient Hospital Stay (HOSPITAL_COMMUNITY)
Admission: EM | Admit: 2013-08-20 | Discharge: 2013-08-24 | DRG: 292 | Disposition: A | Discharge status: Home Health | Payer: Medicare Other | Attending: Internal Medicine | Admitting: Internal Medicine

## 2013-08-20 DIAGNOSIS — G4733 Obstructive sleep apnea (adult) (pediatric): Secondary | ICD-10-CM | POA: Diagnosis present

## 2013-08-20 DIAGNOSIS — R110 Nausea: Secondary | ICD-10-CM

## 2013-08-20 DIAGNOSIS — E785 Hyperlipidemia, unspecified: Secondary | ICD-10-CM | POA: Diagnosis present

## 2013-08-20 DIAGNOSIS — Z79899 Other long term (current) drug therapy: Secondary | ICD-10-CM | POA: Diagnosis not present

## 2013-08-20 DIAGNOSIS — Z87891 Personal history of nicotine dependence: Secondary | ICD-10-CM

## 2013-08-20 DIAGNOSIS — R141 Gas pain: Secondary | ICD-10-CM

## 2013-08-20 DIAGNOSIS — E039 Hypothyroidism, unspecified: Secondary | ICD-10-CM | POA: Diagnosis present

## 2013-08-20 DIAGNOSIS — Z9119 Patient's noncompliance with other medical treatment and regimen: Secondary | ICD-10-CM | POA: Diagnosis not present

## 2013-08-20 DIAGNOSIS — Z800 Family history of malignant neoplasm of digestive organs: Secondary | ICD-10-CM

## 2013-08-20 DIAGNOSIS — I5189 Other ill-defined heart diseases: Secondary | ICD-10-CM

## 2013-08-20 DIAGNOSIS — Z87442 Personal history of urinary calculi: Secondary | ICD-10-CM

## 2013-08-20 DIAGNOSIS — N390 Urinary tract infection, site not specified: Secondary | ICD-10-CM

## 2013-08-20 DIAGNOSIS — R0602 Shortness of breath: Secondary | ICD-10-CM | POA: Diagnosis not present

## 2013-08-20 DIAGNOSIS — R197 Diarrhea, unspecified: Secondary | ICD-10-CM

## 2013-08-20 DIAGNOSIS — I50810 Right heart failure, unspecified: Secondary | ICD-10-CM | POA: Diagnosis present

## 2013-08-20 DIAGNOSIS — R609 Edema, unspecified: Secondary | ICD-10-CM | POA: Diagnosis not present

## 2013-08-20 DIAGNOSIS — K8020 Calculus of gallbladder without cholecystitis without obstruction: Secondary | ICD-10-CM

## 2013-08-20 DIAGNOSIS — Z91199 Patient's noncompliance with other medical treatment and regimen due to unspecified reason: Secondary | ICD-10-CM

## 2013-08-20 DIAGNOSIS — Z6841 Body Mass Index (BMI) 40.0 and over, adult: Secondary | ICD-10-CM | POA: Diagnosis not present

## 2013-08-20 DIAGNOSIS — K7689 Other specified diseases of liver: Secondary | ICD-10-CM

## 2013-08-20 DIAGNOSIS — Z8042 Family history of malignant neoplasm of prostate: Secondary | ICD-10-CM | POA: Diagnosis not present

## 2013-08-20 DIAGNOSIS — I872 Venous insufficiency (chronic) (peripheral): Secondary | ICD-10-CM | POA: Diagnosis present

## 2013-08-20 DIAGNOSIS — M1990 Unspecified osteoarthritis, unspecified site: Secondary | ICD-10-CM | POA: Diagnosis present

## 2013-08-20 DIAGNOSIS — N259 Disorder resulting from impaired renal tubular function, unspecified: Secondary | ICD-10-CM

## 2013-08-20 DIAGNOSIS — L03115 Cellulitis of right lower limb: Secondary | ICD-10-CM

## 2013-08-20 DIAGNOSIS — R0989 Other specified symptoms and signs involving the circulatory and respiratory systems: Secondary | ICD-10-CM | POA: Diagnosis not present

## 2013-08-20 DIAGNOSIS — I2510 Atherosclerotic heart disease of native coronary artery without angina pectoris: Secondary | ICD-10-CM

## 2013-08-20 DIAGNOSIS — K219 Gastro-esophageal reflux disease without esophagitis: Secondary | ICD-10-CM | POA: Diagnosis present

## 2013-08-20 DIAGNOSIS — Z01810 Encounter for preprocedural cardiovascular examination: Secondary | ICD-10-CM

## 2013-08-20 DIAGNOSIS — R0609 Other forms of dyspnea: Secondary | ICD-10-CM | POA: Diagnosis not present

## 2013-08-20 DIAGNOSIS — I10 Essential (primary) hypertension: Secondary | ICD-10-CM | POA: Diagnosis present

## 2013-08-20 DIAGNOSIS — R1010 Upper abdominal pain, unspecified: Secondary | ICD-10-CM

## 2013-08-20 DIAGNOSIS — R194 Change in bowel habit: Secondary | ICD-10-CM

## 2013-08-20 DIAGNOSIS — K625 Hemorrhage of anus and rectum: Secondary | ICD-10-CM

## 2013-08-20 DIAGNOSIS — D696 Thrombocytopenia, unspecified: Secondary | ICD-10-CM | POA: Diagnosis present

## 2013-08-20 DIAGNOSIS — Z86010 Personal history of colonic polyps: Secondary | ICD-10-CM

## 2013-08-20 DIAGNOSIS — N400 Enlarged prostate without lower urinary tract symptoms: Secondary | ICD-10-CM

## 2013-08-20 DIAGNOSIS — R6510 Systemic inflammatory response syndrome (SIRS) of non-infectious origin without acute organ dysfunction: Secondary | ICD-10-CM

## 2013-08-20 DIAGNOSIS — R142 Eructation: Secondary | ICD-10-CM

## 2013-08-20 DIAGNOSIS — R630 Anorexia: Secondary | ICD-10-CM

## 2013-08-20 DIAGNOSIS — E669 Obesity, unspecified: Secondary | ICD-10-CM

## 2013-08-20 DIAGNOSIS — Z87440 Personal history of urinary (tract) infections: Secondary | ICD-10-CM

## 2013-08-20 DIAGNOSIS — R143 Flatulence: Secondary | ICD-10-CM

## 2013-08-20 DIAGNOSIS — I509 Heart failure, unspecified: Secondary | ICD-10-CM | POA: Diagnosis present

## 2013-08-20 DIAGNOSIS — L03116 Cellulitis of left lower limb: Secondary | ICD-10-CM

## 2013-08-20 DIAGNOSIS — R600 Localized edema: Secondary | ICD-10-CM | POA: Diagnosis present

## 2013-08-20 DIAGNOSIS — I5033 Acute on chronic diastolic (congestive) heart failure: Principal | ICD-10-CM | POA: Diagnosis present

## 2013-08-20 DIAGNOSIS — K5900 Constipation, unspecified: Secondary | ICD-10-CM

## 2013-08-20 HISTORY — DX: Chronic diastolic (congestive) heart failure: I50.32

## 2013-08-20 LAB — COMPREHENSIVE METABOLIC PANEL
ALBUMIN: 3.2 g/dL — AB (ref 3.5–5.2)
ALK PHOS: 101 U/L (ref 39–117)
ALT: 19 U/L (ref 0–53)
AST: 21 U/L (ref 0–37)
BUN: 16 mg/dL (ref 6–23)
CALCIUM: 9.4 mg/dL (ref 8.4–10.5)
CO2: 34 mEq/L — ABNORMAL HIGH (ref 19–32)
Chloride: 100 mEq/L (ref 96–112)
Creatinine, Ser: 0.92 mg/dL (ref 0.50–1.35)
GFR calc Af Amer: >90 mL/min (ref >90)
GFR calc non Af Amer: 82 mL/min — ABNORMAL LOW (ref >90)
Glucose, Bld: 120 mg/dL — ABNORMAL HIGH (ref 70–99)
POTASSIUM: 3.6 meq/L — AB (ref 3.7–5.3)
SODIUM: 142 meq/L (ref 137–147)
Total Bilirubin: 0.8 mg/dL (ref 0.3–1.2)
Total Protein: 6.8 g/dL (ref 6.0–8.3)

## 2013-08-20 LAB — PROTIME-INR
INR: 1 (ref 0.00–1.49)
PROTHROMBIN TIME: 13 s (ref 11.6–15.2)

## 2013-08-20 LAB — CBC
HCT: 41.7 % (ref 39.0–52.0)
Hemoglobin: 13.6 g/dL (ref 13.0–17.0)
MCH: 25.9 pg — AB (ref 26.0–34.0)
MCHC: 32.6 g/dL (ref 30.0–36.0)
MCV: 79.4 fL (ref 78.0–100.0)
Platelets: 125 10*3/uL — ABNORMAL LOW (ref 150–400)
RBC: 5.25 MIL/uL (ref 4.22–5.81)
RDW: 17.7 % — AB (ref 11.5–15.5)
WBC: 10.3 10*3/uL (ref 4.0–10.5)

## 2013-08-20 LAB — MAGNESIUM: Magnesium: 2.2 mg/dL (ref 1.5–2.5)

## 2013-08-20 LAB — TROPONIN I

## 2013-08-20 LAB — PRO B NATRIURETIC PEPTIDE: Pro B Natriuretic peptide (BNP): 10 pg/mL (ref 0–125)

## 2013-08-20 MED ORDER — TAMSULOSIN HCL 0.4 MG PO CAPS
0.4000 mg | ORAL_CAPSULE | Freq: Every day | ORAL | Status: DC
  Administered 2013-08-20 – 2013-08-23 (×4): 0.4 mg via ORAL
  Filled 2013-08-20 (×4): qty 1

## 2013-08-20 MED ORDER — DORZOLAMIDE HCL-TIMOLOL MAL 2-0.5 % OP SOLN
1.0000 [drp] | Freq: Two times a day (BID) | OPHTHALMIC | Status: DC
  Administered 2013-08-20 – 2013-08-24 (×7): 1 [drp] via OPHTHALMIC
  Filled 2013-08-20 (×2): qty 10

## 2013-08-20 MED ORDER — METOPROLOL SUCCINATE ER 25 MG PO TB24
12.5000 mg | ORAL_TABLET | Freq: Every morning | ORAL | Status: DC
  Administered 2013-08-20 – 2013-08-22 (×3): 12.5 mg via ORAL
  Filled 2013-08-20 (×3): qty 1

## 2013-08-20 MED ORDER — POTASSIUM CHLORIDE CRYS ER 20 MEQ PO TBCR
20.0000 meq | EXTENDED_RELEASE_TABLET | Freq: Two times a day (BID) | ORAL | Status: DC
  Administered 2013-08-20 – 2013-08-24 (×9): 20 meq via ORAL
  Filled 2013-08-20 (×9): qty 1

## 2013-08-20 MED ORDER — FUROSEMIDE 10 MG/ML IJ SOLN
80.0000 mg | Freq: Once | INTRAMUSCULAR | Status: AC
  Administered 2013-08-20: 80 mg via INTRAVENOUS
  Filled 2013-08-20: qty 8

## 2013-08-20 MED ORDER — OXYCODONE-ACETAMINOPHEN 10-325 MG PO TABS: 1.0000 | ORAL_TABLET | Freq: Four times a day (QID) | ORAL | Status: DC | PRN

## 2013-08-20 MED ORDER — ONDANSETRON HCL 4 MG/2ML IJ SOLN: 4.0000 mg | Freq: Four times a day (QID) | INTRAMUSCULAR | Status: DC | PRN

## 2013-08-20 MED ORDER — DOCUSATE SODIUM 100 MG PO CAPS: 100.0000 mg | ORAL_CAPSULE | Freq: Every day | ORAL | Status: DC | PRN

## 2013-08-20 MED ORDER — FUROSEMIDE 10 MG/ML IJ SOLN
80.0000 mg | Freq: Two times a day (BID) | INTRAMUSCULAR | Status: DC
  Administered 2013-08-20 – 2013-08-21 (×2): 80 mg via INTRAVENOUS
  Filled 2013-08-20 (×2): qty 8

## 2013-08-20 MED ORDER — SODIUM CHLORIDE 0.9 % IJ SOLN
3.0000 mL | Freq: Two times a day (BID) | INTRAMUSCULAR | Status: DC
  Administered 2013-08-20 – 2013-08-24 (×8): 3 mL via INTRAVENOUS

## 2013-08-20 MED ORDER — ACETAMINOPHEN 325 MG PO TABS: 650.0000 mg | ORAL_TABLET | Freq: Four times a day (QID) | ORAL | Status: DC | PRN

## 2013-08-20 MED ORDER — CYCLOBENZAPRINE HCL 10 MG PO TABS: 10.0000 mg | ORAL_TABLET | Freq: Three times a day (TID) | ORAL | Status: DC | PRN

## 2013-08-20 MED ORDER — SODIUM CHLORIDE 0.9 % IJ SOLN
3.0000 mL | INTRAMUSCULAR | Status: DC | PRN
  Administered 2013-08-22: 3 mL via INTRAVENOUS

## 2013-08-20 MED ORDER — POTASSIUM CHLORIDE CRYS ER 20 MEQ PO TBCR
20.0000 meq | EXTENDED_RELEASE_TABLET | Freq: Once | ORAL | Status: AC
  Administered 2013-08-20: 20 meq via ORAL
  Filled 2013-08-20: qty 1

## 2013-08-20 MED ORDER — SODIUM CHLORIDE 0.9 % IV SOLN: 250.0000 mL | INTRAVENOUS | Status: DC | PRN

## 2013-08-20 MED ORDER — SODIUM CHLORIDE 0.9 % IJ SOLN: 3.0000 mL | Freq: Two times a day (BID) | INTRAMUSCULAR | Status: DC

## 2013-08-20 MED ORDER — VERAPAMIL HCL ER 240 MG PO TBCR
120.0000 mg | EXTENDED_RELEASE_TABLET | Freq: Every day | ORAL | Status: DC
  Administered 2013-08-20 – 2013-08-23 (×4): 120 mg via ORAL
  Filled 2013-08-20 (×4): qty 1

## 2013-08-20 MED ORDER — ACETAMINOPHEN 650 MG RE SUPP: 650.0000 mg | Freq: Four times a day (QID) | RECTAL | Status: DC | PRN

## 2013-08-20 MED ORDER — OXYCODONE-ACETAMINOPHEN 5-325 MG PO TABS
1.0000 | ORAL_TABLET | Freq: Four times a day (QID) | ORAL | Status: DC | PRN
  Administered 2013-08-20 – 2013-08-23 (×5): 1 via ORAL
  Filled 2013-08-20 (×5): qty 1

## 2013-08-20 MED ORDER — LORATADINE 10 MG PO TABS
10.0000 mg | ORAL_TABLET | Freq: Every day | ORAL | Status: DC
  Administered 2013-08-21 – 2013-08-24 (×4): 10 mg via ORAL
  Filled 2013-08-20 (×4): qty 1

## 2013-08-20 MED ORDER — ALUM & MAG HYDROXIDE-SIMETH 200-200-20 MG/5ML PO SUSP
30.0000 mL | Freq: Four times a day (QID) | ORAL | Status: DC | PRN
  Administered 2013-08-24: 30 mL via ORAL
  Filled 2013-08-20: qty 30

## 2013-08-20 MED ORDER — PANTOPRAZOLE SODIUM 40 MG PO TBEC
40.0000 mg | DELAYED_RELEASE_TABLET | Freq: Two times a day (BID) | ORAL | Status: DC
  Administered 2013-08-20 – 2013-08-24 (×9): 40 mg via ORAL
  Filled 2013-08-20 (×9): qty 1

## 2013-08-20 MED ORDER — OXYCODONE HCL 5 MG PO TABS
5.0000 mg | ORAL_TABLET | Freq: Four times a day (QID) | ORAL | Status: DC | PRN
  Administered 2013-08-21 – 2013-08-23 (×4): 5 mg via ORAL
  Filled 2013-08-20 (×4): qty 1

## 2013-08-20 MED ORDER — LATANOPROST 0.005 % OP SOLN
1.0000 [drp] | Freq: Every day | OPHTHALMIC | Status: DC
  Administered 2013-08-20 – 2013-08-23 (×4): 1 [drp] via OPHTHALMIC
  Filled 2013-08-20: qty 2.5

## 2013-08-20 MED ORDER — ATORVASTATIN CALCIUM 40 MG PO TABS
80.0000 mg | ORAL_TABLET | Freq: Every day | ORAL | Status: DC
  Administered 2013-08-20 – 2013-08-23 (×4): 80 mg via ORAL
  Filled 2013-08-20 (×4): qty 2

## 2013-08-20 MED ORDER — ONDANSETRON HCL 4 MG PO TABS: 4.0000 mg | ORAL_TABLET | Freq: Four times a day (QID) | ORAL | Status: DC | PRN

## 2013-08-20 NOTE — ED Provider Notes (Addendum)
CSN: [id number]     Arrival date & time 08/20/13  1026 History  This chart was scribed for Kathalene Frames, MD by Roe Coombs, ED Scribe. The patient was seen in room APA18/APA18. Patient's care was started at 10:40 AM.  Chief Complaint  Patient presents with  . Shortness of Breath   Patient is a 73 y.o. male presenting with shortness of breath. The history is provided by the patient. No language interpreter was used.  Shortness of Breath Severity:  Moderate Duration:  2 days Timing:  Constant Progression:  Worsening Chronicity:  New Ineffective treatments:  Diuretics Associated symptoms: no fever   Risk factors: obesity     HPI Comments: [name] [name] is a 73 y.o. male with history of CHF who presents to the Emergency Department complaining of constant moderate shortness of breath for the past 2 days. Shortness of breath is worse with with walking and exertion. There is associated lightheadedness with walking, leg swelling, and weight gain. He has had 10 pounds of weight gain over the last 10 weeks. He saw Dr. Gerarda Fraction today who sent him to the ED further evaluation and ultimately admission. Patient has been taking Lasix 80 mg tid as instructed. His other medical history includes hyperlipidemia, PUD, GERD, hypertension, venous insufficiency and sleep apnea.   Past Medical History  Diagnosis Date  . CHF (congestive heart failure)   . Obesity   . Hyperlipidemia   . PUD (peptic ulcer disease)   . GERD (gastroesophageal reflux disease)   . S/P endoscopy 2007, 2012    2007: nl, May 2012: antral erosions  . Osteoarthritis   . S/P colonoscopy 2007, Feb and March 2012    hx of adenomas, left-sided diverticula, On 07/2010 TCS, fresh blood and clot noted coming from TI.  Marland Kitchen Anxiety   . Hypothyroidism     hypothyroid  . Ringing in ears   . History of kidney stones   . History of bladder infections   . Heel spur     right heel  . Collagen vascular disease     history of venous insufficency  and LE cellulitis  . Thrombocytopenia 12/30/2011    Stable  . BPH (benign prostatic hyperplasia)   . Hypertension     Sees Dr. Debara Pickett  . Bilateral lower leg cellulitis   . Sleep apnea     uses oxygen 2L via [place] cannot tolerate CPAP - sleep study 2008 AHI 48.80/hr and 56.70hr during REM  . Complication of anesthesia     due to sleep apnea pt has had difficulty being put under anesthesia  . PONV (postoperative nausea and vomiting)   . Deep venous insufficiency    Past Surgical History  Procedure Laterality Date  . Knee arthroscopy    . Rotator cuff repair    . Cholecystectomy    . Lumbar spine surgery    . Small bowel capsule study  07/2011    ?transient focal ischemia in distal small bowel to explain GI bleeding  . Esophagogastroduodenoscopy  Aug 2013    [facility], single 76mm pedunculated polyp otherwise normal. HYPERPLASTIC, NEGATIVE h.pylori  . Cardiac catheterization  02/27/2007    no sign of CAD, LVH, mod pulm HTN (Dr. Jackie Plum(  . Transurethral resection of prostate    . Transurethral resection of prostate N/A 06/18/2013    Procedure: TRANSURETHRAL RESECTION OF THE PROSTATE (TURP);  Surgeon: Marissa Nestle, MD;  Location: [facility];  Service: Urology;  Laterality: N/A;  . Transthoracic echocardiogram  09/2008    EF 60-65%, mod conc LVH  . Nm myocar perf wall motion  12/2006    dipyridamole myoview - stress images show mild perfusion defect in mid inferior walls with reversibility at rest; mild perfusion defect in mid inferolateral wall at stress with mild defect reversibility, EF 62%, abnormal but low risk study    Family History  Problem Relation Age of Onset  . Colon cancer Father 83    deceased  . Prostate cancer Father   . Pancreatic cancer Mother 8    deceased   History  Substance Use Topics  . Smoking status: Former Smoker -- 8 years    Types: Cigars    Quit date: 08/10/1973  . Smokeless tobacco: Former Systems developer    Quit date: 08/10/1973     Comment: Pt reports intermittent  smoking  . Alcohol Use: No    Review of Systems  Constitutional: Negative for fever.  Respiratory: Positive for shortness of breath.   Cardiovascular: Positive for leg swelling.  Neurological: Positive for light-headedness.  All other systems reviewed and are negative.   Allergies  Aspirin  Home Medications   Current Outpatient Rx  Name  Route  Sig  Dispense  Refill  . acetaminophen (TYLENOL) 325 MG tablet   Oral   Take 325-650 mg by mouth every 6 (six) hours as needed for pain.          Marland Kitchen atorvastatin (LIPITOR) 80 MG tablet   Oral   Take 80 mg by mouth at bedtime.         . cetirizine (ZYRTEC) 10 MG tablet   Oral   Take 10 mg by mouth daily.         . cyclobenzaprine (FLEXERIL) 10 MG tablet   Oral   Take 1 tablet (10 mg total) by mouth 3 (three) times daily as needed for muscle spasms.   45 tablet   0   . docusate sodium (COLACE) 100 MG capsule   Oral   Take 100 mg by mouth daily as needed for mild constipation.          . Dorzolamide HCl-Timolol Mal PF 22.3-6.8 MG/ML SOLN   Ophthalmic   Apply 1 drop to eye 2 (two) times daily. Dorzolamide HCI-Timolol Maleate Ophthalmic Solution 2.23%/0.68%         . doxycycline (VIBRA-TABS) 100 MG tablet   Oral   Take 1 tablet (100 mg total) by mouth every 12 (twelve) hours.   6 tablet   0   . furosemide (LASIX) 80 MG tablet   Oral   Take 80 mg by mouth 3 (three) times daily.          Marland Kitchen latanoprost (XALATAN) 0.005 % ophthalmic solution   Both Eyes   Place 1 drop into both eyes at bedtime.         . metoprolol succinate (TOPROL-XL) 25 MG 24 hr tablet   Oral   Take 0.5 tablets (12.5 mg total) by mouth every morning.   30 tablet   11   . oxyCODONE-acetaminophen (PERCOCET) 10-325 MG per tablet   Oral   Take 1 tablet by mouth every 6 (six) hours as needed for pain.   70 tablet   0   . pantoprazole (PROTONIX) 40 MG tablet   Oral   Take 40 mg by mouth 2 (two) times daily.         Vladimir Faster  Glycol-Propyl Glycol (LUBRICANT EYE DROPS) 0.4-0.3 % SOLN   Ophthalmic  Apply 1 drop to eye daily as needed (for lubricant eye drops).         . potassium chloride SA (K-DUR,KLOR-CON) 20 MEQ tablet   Oral   Take 1 tablet (20 mEq total) by mouth 2 (two) times daily.   60 tablet   0   . Tamsulosin HCl (FLOMAX) 0.4 MG CAPS   Oral   Take 0.4 mg by mouth daily after supper.          . verapamil (CALAN-SR) 120 MG CR tablet   Oral   Take 1 tablet (120 mg total) by mouth at bedtime.   30 tablet   12    Triage Vitals: BP 110/61  Pulse 101  Temp(Src) 97.7 F (36.5 C) (Oral)  SpO2 92% Physical Exam  Nursing note and vitals reviewed. Constitutional: He appears well-developed and well-nourished. No distress.  HENT:  Head: Normocephalic and atraumatic.  Right Ear: External ear normal.  Left Ear: External ear normal.  Eyes: Conjunctivae are normal. Right eye exhibits no discharge. Left eye exhibits no discharge. No scleral icterus.  Neck: Neck supple. No tracheal deviation present.  Cardiovascular: Normal rate, regular rhythm and intact distal pulses.   Pulmonary/Chest: Effort normal and breath sounds normal. No stridor. No respiratory distress. He has no wheezes. He has no rales.  Abdominal: Soft. Bowel sounds are normal. He exhibits no distension. There is no tenderness. There is no rebound and no guarding.  Musculoskeletal: He exhibits edema. He exhibits no tenderness.  Pitting edema bilateral lower extremities up to thigh.  Neurological: He is alert. He has normal strength. No cranial nerve deficit (no facial droop, extraocular movements intact, no slurred speech) or sensory deficit. He exhibits normal muscle tone. He displays no seizure activity. Coordination normal.  Skin: Skin is warm and dry. No rash noted.  Psychiatric: He has a normal mood and affect.    ED Course  Procedures (including critical care time) .DIAGNOSTIC STUDIES: Oxygen Saturation is 92% on room air,  adequate by my interpretation.    COORDINATION OF CARE: 10:46 AM- Patient informed of current plan for treatment and evaluation and agrees with plan at this time.    Labs Review Labs Reviewed  CBC - Abnormal; Notable for the following:    MCH 25.9 (*)    RDW 17.7 (*)    Platelets 125 (*)    All other components within normal limits  COMPREHENSIVE METABOLIC PANEL - Abnormal; Notable for the following:    Potassium 3.6 (*)    CO2 34 (*)    Glucose, Bld 120 (*)    Albumin 3.2 (*)    GFR calc non Af Amer 82 (*)    All other components within normal limits  MAGNESIUM  PRO B NATRIURETIC PEPTIDE  PROTIME-INR  TROPONIN I    Imaging Review Dg Chest 2 View  08/20/2013   CLINICAL DATA:  Fluid retention.  Weight gain.  Shortness of breath.  EXAM: CHEST  2 VIEW  COMPARISON:  Tear prior  FINDINGS: Low volume chest with basilar atelectasis. The cardiopericardial silhouette appears within normal limits for volumes of inspiration. No airspace disease/effusion. No focal consolidation to suggest pneumonia.  IMPRESSION: Low volume chest.  No gross acute cardiopulmonary disease.   Electronically Signed   By: Dereck Ligas M.D.   On: 08/20/2013 12:07   EKG Sinus tachycardia with premature ventricular complex rate 103 PVCs and sinus tachycardia are new since last EKG  Medications  furosemide (LASIX) injection 80 mg (80 mg Intravenous  Given 08/20/13 1115)  potassium chloride SA (K-DUR,KLOR-CON) CR tablet 20 mEq (20 mEq Oral Given 08/20/13 1113)    MDM   Final diagnoses:  Right heart failure   Pt has had progressive weight gain and leg swelling despite oral diuretics.  Seen by PCP today.  Sent to ED for admission, IV diuretics  The patient's x-ray does not show pulmonary edema. He does have mild tachycardia. No history of PE or DVT. I suspect his symptoms are related to heart failure however does not respond to treatment, we might consider chest CT to evaluate for pulmonary embolism. I  personally performed the services described in this documentation, which was scribed in my presence.  The recorded information has been reviewed and is accurate.   Kathalene Frames, MD 08/20/13 [place] Chelcea Zahn, MD 08/20/13 [phone]

## 2013-08-20 NOTE — H&P (Signed)
[facility] History and Physical  [name] [name] OBS:[id number] DOB: [birth year] DOA: 08/20/2013  Referring physician:  PCP: [name]., MD   Chief Complaint: Lower extremity edema and shortness of breath  HPI: [name] [name] is a 73 y.o. male with a past medical history of right-sided congestive heart failure, prostatitis, obesity, lower remedy edema presents to the emergency department with the chief complaint of gradual worsening lower extremity edema and shortness of breath. Information is obtained from the patient. He states for the last 2 weeks he's noticed his lower extremities gradually becoming more edematous. 2 days ago he developed pain with this and found it difficult to ambulate. As his symptoms include shortness of breath and palpitations. He denies chest pain cough headache dizziness syncope or near-syncope. He does endorse some intermittent nausea but no vomiting. He denies dysuria hematuria frequency or urgency. He denies abdominal pain constipation diarrhea. The beginning of March this year he was hospitalized for acute on chronic diastolic CHF. Initial evaluation in the emergency department yields a potassium of 3.6 serum glucose of 120 albumin of 3.2 platelets 125, chest x-ray with low volume in no acute cardiopulmonary disease and a proBNP of 10. EKG yields sinus tachycardia at a rate of 103 with occasional PVC. In the emergency department he was given 80 mg of Lasix IV. Vital signs are stable he was afebrile and not hypoxic.    Review of Systems:  10 point review of systems complete and all systems are negative except as indicated by history of present illness   Past Medical History  Diagnosis Date  . CHF (congestive heart failure)   . Obesity   . Hyperlipidemia   . PUD (peptic ulcer disease)   . GERD (gastroesophageal reflux disease)   . S/P endoscopy 2007, 2012    2007: nl, May 2012: antral erosions  . Osteoarthritis   . S/P colonoscopy 2007, Feb and  March 2012    hx of adenomas, left-sided diverticula, On 07/2010 TCS, fresh blood and clot noted coming from TI.  Marland Kitchen Anxiety   . Hypothyroidism     hypothyroid  . Ringing in ears   . History of kidney stones   . History of bladder infections   . Heel spur     right heel  . Collagen vascular disease     history of venous insufficency and LE cellulitis  . Thrombocytopenia 12/30/2011    Stable  . BPH (benign prostatic hyperplasia)   . Hypertension     Sees Dr. Debara Pickett  . Bilateral lower leg cellulitis   . Sleep apnea     uses oxygen 2L via [place] cannot tolerate CPAP - sleep study 2008 AHI 48.80/hr and 56.70hr during REM  . Complication of anesthesia     due to sleep apnea pt has had difficulty being put under anesthesia  . PONV (postoperative nausea and vomiting)   . Deep venous insufficiency   . Chronic diastolic HF (heart failure)    Past Surgical History  Procedure Laterality Date  . Knee arthroscopy    . Rotator cuff repair    . Cholecystectomy    . Lumbar spine surgery    . Small bowel capsule study  07/2011    ?transient focal ischemia in distal small bowel to explain GI bleeding  . Esophagogastroduodenoscopy  Aug 2013    [facility], single 73mm pedunculated polyp otherwise normal. HYPERPLASTIC, NEGATIVE h.pylori  . Cardiac catheterization  02/27/2007    no sign of CAD, LVH, mod pulm HTN (Dr. Lenna Sciara.  [place](  . Transurethral resection of prostate    . Transurethral resection of prostate N/A 06/18/2013    Procedure: TRANSURETHRAL RESECTION OF THE PROSTATE (TURP);  Surgeon: Marissa Nestle, MD;  Location: [facility];  Service: Urology;  Laterality: N/A;  . Transthoracic echocardiogram  09/2008    EF 60-65%, mod conc LVH  . Nm myocar perf wall motion  12/2006    dipyridamole myoview - stress images show mild perfusion defect in mid inferior walls with reversibility at rest; mild perfusion defect in mid inferolateral wall at stress with mild defect reversibility, EF 62%, abnormal but low risk study     Social History:  reports that he quit smoking about 40 years ago. His smoking use included Cigars. He quit smokeless tobacco use about 40 years ago. He reports that he does not drink alcohol or use illicit drugs. Lives at home with his son. He is a retired Chief Technology Officer. He is independent with ADLs Allergies  Allergen Reactions  . Aspirin Other (See Comments)    Nausea and upset stomach      Family History  Problem Relation Age of Onset  . Colon cancer Father 36    deceased  . Prostate cancer Father   . Pancreatic cancer Mother 33    deceased     Prior to Admission medications   Medication Sig Start Date End Date Taking? Authorizing Provider  atorvastatin (LIPITOR) 80 MG tablet Take 80 mg by mouth at bedtime.   Yes Historical Provider, MD  cetirizine (ZYRTEC) 10 MG tablet Take 10 mg by mouth daily.   Yes Historical Provider, MD  cyclobenzaprine (FLEXERIL) 10 MG tablet Take 1 tablet (10 mg total) by mouth 3 (three) times daily as needed for muscle spasms. 08/31/12  Yes Winfield Cunas, MD  docusate sodium (COLACE) 100 MG capsule Take 100 mg by mouth daily as needed for mild constipation.    Yes Historical Provider, MD  Dorzolamide HCl-Timolol Mal PF 22.3-6.8 MG/ML SOLN Apply 1 drop to eye 2 (two) times daily. Dorzolamide HCI-Timolol Maleate Ophthalmic Solution 2.23%/0.68%   Yes Historical Provider, MD  furosemide (LASIX) 80 MG tablet Take 80 mg by mouth 3 (three) times daily.    Yes Historical Provider, MD  latanoprost (XALATAN) 0.005 % ophthalmic solution Place 1 drop into both eyes at bedtime.   Yes Historical Provider, MD  metoprolol succinate (TOPROL-XL) 25 MG 24 hr tablet Take 0.5 tablets (12.5 mg total) by mouth every morning. 02/12/13  Yes Lorretta Harp, MD  oxyCODONE-acetaminophen (PERCOCET) 10-325 MG per tablet Take 1 tablet by mouth every 6 (six) hours as needed for pain. 08/31/12  Yes Winfield Cunas, MD  pantoprazole (PROTONIX) 40 MG tablet Take 40 mg by mouth  2 (two) times daily.   Yes Historical Provider, MD  Polyethyl Glycol-Propyl Glycol (LUBRICANT EYE DROPS) 0.4-0.3 % SOLN Apply 1 drop to eye daily as needed (for lubricant eye drops).   Yes Historical Provider, MD  potassium chloride SA (K-DUR,KLOR-CON) 20 MEQ tablet Take 1 tablet (20 mEq total) by mouth 2 (two) times daily. 06/17/12  Yes Nimish Luther Parody, MD  Tamsulosin HCl (FLOMAX) 0.4 MG CAPS Take 0.4 mg by mouth daily after supper.    Yes Historical Provider, MD  verapamil (CALAN-SR) 120 MG CR tablet Take 1 tablet (120 mg total) by mouth at bedtime. 05/14/13  Yes Penni Bombard, MD  acetaminophen (TYLENOL) 325 MG tablet Take 325-650 mg by mouth every 6 (six) hours as needed for pain.  Historical Provider, MD   Physical Exam: Filed Vitals:   08/20/13 1410  BP: 137/78  Pulse: 98  Temp: 98.1 F (36.7 C)  Resp: 20    BP 137/78  Pulse 98  Temp(Src) 98.1 F (36.7 C) (Oral)  Resp 20  Ht 5\' 9"  (1.753 m)  Wt 135.353 kg (298 lb 6.4 oz)  BMI 44.05 kg/m2  SpO2 97%  General:  Appears calm and comfortable, morbidly obese Eyes: PERRL, normal lids, irises & conjunctiva ENT: grossly normal hearing, lips & tongue Neck: no LAD, masses or thyromegaly Cardiovascular: Regular rate and rhythm I hear no murmur no gallop no rub bilateral lower extremity edema 2-3+ pitting up to the knee Telemetry: SR, no arrhythmias  Respiratory: Normal effort breath sounds with very faint crackles on left base otherwise clear. Good air movement no wheeze no rhonchi Abdomen: soft, ntnd obese positive bowel sounds throughout Skin: no rash or induration seen on limited exam Musculoskeletal: grossly normal tone BUE/BLE Psychiatric: grossly normal mood and affect, speech fluent and appropriate Neurologic: grossly non-focal.          Labs on Admission:  Basic Metabolic Panel:  Recent Labs Lab 08/20/13 1115  NA 142  K 3.6*  CL 100  CO2 34*  GLUCOSE 120*  BUN 16  CREATININE 0.92  CALCIUM 9.4  MG 2.2    Liver Function Tests:  Recent Labs Lab 08/20/13 1115  AST 21  ALT 19  ALKPHOS 101  BILITOT 0.8  PROT 6.8  ALBUMIN 3.2*   No results found for this basename: LIPASE, AMYLASE,  in the last 168 hours No results found for this basename: AMMONIA,  in the last 168 hours CBC:  Recent Labs Lab 08/20/13 1115  WBC 10.3  HGB 13.6  HCT 41.7  MCV 79.4  PLT 125*   Cardiac Enzymes:  Recent Labs Lab 08/20/13 1115  TROPONINI <0.30    BNP (last 3 results)  Recent Labs  05/25/13 1239 08/20/13 1115  PROBNP 26.1 10.0   CBG: No results found for this basename: GLUCAP,  in the last 168 hours  Radiological Exams on Admission: Dg Chest 2 View  08/20/2013   CLINICAL DATA:  Fluid retention.  Weight gain.  Shortness of breath.  EXAM: CHEST  2 VIEW  COMPARISON:  Tear prior  FINDINGS: Low volume chest with basilar atelectasis. The cardiopericardial silhouette appears within normal limits for volumes of inspiration. No airspace disease/effusion. No focal consolidation to suggest pneumonia.  IMPRESSION: Low volume chest.  No gross acute cardiopulmonary disease.   Electronically Signed   By: Dereck Ligas M.D.   On: 08/20/2013 12:07    EKG: Independently reviewed. Eyes tachycardia with occasional PVC  Assessment/Plan Principal Problem:   Acute on chronic diastolic CHF: Related to suspected pulmonary hypertension in the setting of obstructive sleep apnea noncompliance with CPAP. Will admit to telemetry. Patient takes 80 mg of Lasix by mouth 3 times a day at home. Will continue Lasix 80 mg IV. Will monitor intake and output. Obtain daily weights. Monitor electrolytes. Provide a low-sodium diet and fluid restriction as well. He had an echo done December 2014 which yielded an ejection fraction of 65-70% and grade 1 diastolic dysfunction. Will not repeat echo at this time.  Active Problems:   HYPERTENSION: Controlled. Home medications include Toprol. Will continue this.    GASTROESOPHAGEAL  REFLUX DISEASE, CHRONIC: Stable at baseline    Thrombocytopenia: Chronic. Chart review indicates his current level of 125 is close to baseline  Hyperlipidemia: Continue home medication    Morbid obesity- BMI 44.5: Nutritional consult    Edema of both legs: Chronic in setting of diastolic heart failure. Please see #1.   Code Status: full Family Communication: son at bedside Disposition Plan: home when ready  Time spent: 67 minutes  [facility] Pager [phone]

## 2013-08-20 NOTE — ED Notes (Signed)
Pt reports has gained 10lb in 2 weeks.  C/O swelling, SOB, dizziness, and heart fluttering x 2 days.  Reports saw Dr. Gerarda Fraction today and was sent here.

## 2013-08-20 NOTE — H&P (Signed)
Patient seen and examined. Above note reviewed.  He's been admitted with progressive edema, shortness of breath and signs of right-sided heart failure. He had a similar admission earlier this month. He'll be placed on intravenous Lasix. We will request a cardiology consultation to assist with management. The patient reports compliance with his medications. He may need transition to [facility]. Monitor strict intake and output.  [name] [name]

## 2013-08-20 NOTE — ED Notes (Signed)
Pt c/o SOB and leg swelling x1-2 weeks. Pt has hx of CHF. Pt states "I think I've gained a lot of weight in the last week or two".

## 2013-08-21 DIAGNOSIS — R630 Anorexia: Secondary | ICD-10-CM | POA: Diagnosis not present

## 2013-08-21 DIAGNOSIS — I509 Heart failure, unspecified: Secondary | ICD-10-CM | POA: Diagnosis not present

## 2013-08-21 DIAGNOSIS — I5033 Acute on chronic diastolic (congestive) heart failure: Secondary | ICD-10-CM | POA: Diagnosis not present

## 2013-08-21 LAB — BASIC METABOLIC PANEL
BUN: 14 mg/dL (ref 6–23)
CALCIUM: 9.3 mg/dL (ref 8.4–10.5)
CO2: 35 mEq/L — ABNORMAL HIGH (ref 19–32)
Chloride: 100 mEq/L (ref 96–112)
Creatinine, Ser: 0.99 mg/dL (ref 0.50–1.35)
GFR calc Af Amer: >90 mL/min (ref >90)
GFR calc non Af Amer: 79 mL/min — ABNORMAL LOW (ref >90)
GLUCOSE: 148 mg/dL — AB (ref 70–99)
Potassium: 4.1 mEq/L (ref 3.7–5.3)
SODIUM: 142 meq/L (ref 137–147)

## 2013-08-21 LAB — CBC
HCT: 40.1 % (ref 39.0–52.0)
Hemoglobin: 12.9 g/dL — ABNORMAL LOW (ref 13.0–17.0)
MCH: 25.8 pg — AB (ref 26.0–34.0)
MCHC: 32.2 g/dL (ref 30.0–36.0)
MCV: 80.2 fL (ref 78.0–100.0)
PLATELETS: 120 10*3/uL — AB (ref 150–400)
RBC: 5 MIL/uL (ref 4.22–5.81)
RDW: 17.8 % — ABNORMAL HIGH (ref 11.5–15.5)
WBC: 10.4 10*3/uL (ref 4.0–10.5)

## 2013-08-21 MED ORDER — FUROSEMIDE 10 MG/ML IJ SOLN
40.0000 mg | Freq: Two times a day (BID) | INTRAMUSCULAR | Status: DC
  Administered 2013-08-21 – 2013-08-23 (×4): 40 mg via INTRAVENOUS
  Filled 2013-08-21 (×4): qty 4

## 2013-08-21 NOTE — Care Management Note (Addendum)
    Page 1 of 2   08/23/2013     1:54:47 PM   CARE MANAGEMENT NOTE 08/23/2013  Patient:  BRIDGER, PIZZI   Account Number:  [id number]  Date Initiated:  08/21/2013  Documentation initiated by:  Theophilus Kinds  Subjective/Objective Assessment:   Pt admitted from home with CHF. Pt lives with his oldest son and has another son who is very active in the care of the pt. Pt is fairly independent with ADL's and has a cane he uses as needed. Pt is active with [facility] and [facility] RN.     Action/Plan:   Will arrange resumption of [facility] (per pts choice) at discharge. Will also notify [facility] when discharged. No other CM needs noted.   Anticipated DC Date:  08/25/2013   Anticipated DC Plan:  [place]  CM consult      [facility] Choice  Resumption Of Svcs/PTA Provider   Choice offered to / List presented to:  C-1 Patient        [place] arranged  HH-1 RN  [place].   Status of service:  Completed, signed off Medicare Important Message given?  YES (If response is "NO", the following Medicare IM given date fields will be blank) Date Medicare IM given:  08/23/2013 Date Additional Medicare IM given:    Discharge Disposition:  [place]  Per UR Regulation:    If discussed at Long Length of Stay Meetings, dates discussed:    Comments:  08/23/13 [place], RN BSN CM Pt possible d/c 08/24/13. Pts HH to resume with [facility] (per pts choice). Romualdo Bolk of [facility] is aware and will collect the pts information from the chart. [place] services to resume within 48 hours of discharge. No DME needs noted. [facility] will also engage at discharge. Pt and pts nurse aware of discharge arrangements.  08/21/13 [place], RN BSN CM

## 2013-08-21 NOTE — Progress Notes (Signed)
[facility] PROGRESS NOTE  [name] [name] CVE:[id number] DOB: February 23, 1941 DOA: 08/20/2013 PCP: [name]., MD  Summary: 73 year old hx right sided CHF presented with hx increasing LEE and weight gain despite Lasix as well as bilateral leg pain. Admitted for IV diuresis, acute on chronic right sided heart failure thought to be secondary to pulmonary HTN.    Assessment/Plan: Principal Problem:  Acute on chronic diastolic CHF: Related to suspected pulmonary hypertension in the setting of obstructive sleep apnea noncompliance with CPAP.  Volume status -4L this am. Will continue Lasix 80 mg IV. Await cardiology input. continue a low-sodium diet and fluid restriction as well. He had an echo done December 2014 which yielded an ejection fraction of 65-70% and grade 1 diastolic dysfunction. Will not repeat echo at this time.   Active Problems:  HYPERTENSION: Controlled. Home medications include Toprol. Will continue this.   GASTROESOPHAGEAL REFLUX DISEASE, CHRONIC: Stable at baseline  Thrombocytopenia: Chronic. Chart review indicates his current level of 125 is close to baseline  Hyperlipidemia: Continue home medication  Morbid obesity- BMI 44.5: Nutritional consult  Edema of both legs: Chronic in setting of diastolic heart failure. Please see #1.    Code Status: full Family Communication: none present Disposition Plan: home when ready   Consultants:  cardiology  Procedures:  none  Antibiotics:  none  HPI/Subjective: Lying in bed, reports "feeling lighter". Denies pain/sob.   Objective: Filed Vitals:   08/21/13 0537  BP: 107/71  Pulse: 76  Temp: 97.7 F (36.5 C)  Resp: 20    Intake/Output Summary (Last 24 hours) at 08/21/13 0844 Last data filed at 08/21/13 0540  Gross per 24 hour  Intake    480 ml  Output   5150 ml  Net  -4670 ml   Filed Weights   08/20/13 1107 08/20/13 1400 08/21/13 0537  Weight: 138.347 kg (305 lb) 135.353 kg (298 lb 6.4 oz) 136.4 kg  (300 lb 11.3 oz)    Exam:   General:  Obese, appears comfortable  Cardiovascular: RRR no m/g/r 2-3+ LE edema up to knees  Respiratory: normal effort BS clear bilaterally  Abdomen: obese soft +BS non-tender   Musculoskeletal:  LE with chronic venous changes  Data Reviewed: Basic Metabolic Panel:  Recent Labs Lab 08/20/13 1115 08/21/13 0446  NA 142 142  K 3.6* 4.1  CL 100 100  CO2 34* 35*  GLUCOSE 120* 148*  BUN 16 14  CREATININE 0.92 0.99  CALCIUM 9.4 9.3  MG 2.2  --    Liver Function Tests:  Recent Labs Lab 08/20/13 1115  AST 21  ALT 19  ALKPHOS 101  BILITOT 0.8  PROT 6.8  ALBUMIN 3.2*   No results found for this basename: LIPASE, AMYLASE,  in the last 168 hours No results found for this basename: AMMONIA,  in the last 168 hours CBC:  Recent Labs Lab 08/20/13 1115 08/21/13 0446  WBC 10.3 10.4  HGB 13.6 12.9*  HCT 41.7 40.1  MCV 79.4 80.2  PLT 125* 120*   Cardiac Enzymes:  Recent Labs Lab 08/20/13 1115  TROPONINI <0.30   BNP (last 3 results)  Recent Labs  05/25/13 1239 08/20/13 1115  PROBNP 26.1 10.0   CBG: No results found for this basename: GLUCAP,  in the last 168 hours  No results found for this or any previous visit (from the past 240 hour(s)).   Studies: Dg Chest 2 View  08/20/2013   CLINICAL DATA:  Fluid retention.  Weight gain.  Shortness of  breath.  EXAM: CHEST  2 VIEW  COMPARISON:  Tear prior  FINDINGS: Low volume chest with basilar atelectasis. The cardiopericardial silhouette appears within normal limits for volumes of inspiration. No airspace disease/effusion. No focal consolidation to suggest pneumonia.  IMPRESSION: Low volume chest.  No gross acute cardiopulmonary disease.   Electronically Signed   By: Dereck Ligas M.D.   On: 08/20/2013 12:07    Scheduled Meds: . atorvastatin  80 mg Oral QHS  . dorzolamide-timolol  1 drop Both Eyes BID  . furosemide  80 mg Intravenous Q12H  . latanoprost  1 drop Both Eyes QHS  .  loratadine  10 mg Oral Daily  . metoprolol succinate  12.5 mg Oral q morning - 10a  . pantoprazole  40 mg Oral BID  . potassium chloride SA  20 mEq Oral BID  . sodium chloride  3 mL Intravenous Q12H  . sodium chloride  3 mL Intravenous Q12H  . tamsulosin  0.4 mg Oral QPC supper  . verapamil  120 mg Oral QHS   Continuous Infusions:   Principal Problem:   Acute on chronic diastolic CHF Active Problems:   HYPERTENSION   GASTROESOPHAGEAL REFLUX DISEASE, CHRONIC   Thrombocytopenia   Hyperlipidemia   Morbid obesity- BMI 44.5   Edema of both legs   Right-sided heart failure   Acute on chronic diastolic heart failure    Time spent: 30 minutes    [facility] Pager [phone]. If 7PM-7AM, please contact night-coverage at [email or web address], password [facility] 08/21/2013, 8:44 AM  LOS: 1 day

## 2013-08-21 NOTE — Plan of Care (Signed)
Problem: Food- and Nutrition-Related Knowledge Deficit (NB-1.1) Goal: Nutrition education Formal process to instruct or train a patient/client in a skill or to impart knowledge to help patients/clients voluntarily manage or modify food choices and eating behavior to maintain or improve health. Outcome: Adequate for Discharge Nutrition Education Note  RD consulted for nutrition education regarding new onset CHF.  RD provided "Low Sodium Nutrition Therapy" handout from the Academy of [employer]. Reviewed patient's dietary recall. Provided examples on ways to decrease sodium intake in diet. Discouraged intake of processed foods and use of salt shaker. Encouraged fresh fruits and vegetables as well as whole grain sources of carbohydrates to maximize fiber intake.   RD discussed why it is important for patient to adhere to diet recommendations, and emphasized the role of fluids, foods to avoid, and importance of weighing self daily. Teach back method used.  Expect fair compliance.  Body mass index is 44.39 kg/(m^2). Pt meets criteria for extreme obesity, class III based on current BMI.  Current diet order is 2 grams Sodium, patient is consuming approximately 100% of meals at this time. Labs and medications reviewed. No further nutrition interventions warranted at this time. RD contact information provided. If additional nutrition issues arise, please re-consult RD.   Teesha Ohm A. Jimmye Norman, RD, LDN Pager: [phone]

## 2013-08-21 NOTE — Consult Note (Signed)
CARDIOLOGY CONSULT NOTE   Patient ID: Dagmawi Venable MRN: [id number] DOB/AGE: 1940-06-11 73 y.o.  Admit Date: 08/20/2013 Referring Physician:  Primary Physician: [name]., MD Consulting Cardiologist: Carlyle Dolly MD Primary Cardiologist: Hilty, [place] MD Reason for Consultation:   Clinical Summary Mr. Jeff is a 73 y.o.male history of minor CAD, 20% by cath in 2008, chronic LEE, OSA unable to tolerate CPAP, admitted worsening LEE, with pain, and worsening dyspnea. He was recently discharged from [facility] for CHF diastolic CHF in March of 3419, with follow up with [facility] cardiology 07/30/2013. Wt on that office visit 303 lbs, discharge wt 294 lbs in early March with admission wt of 305 lbs..    The patient states he just lost his wife to death on 25-Aug-2022, and has been eating fast food, more often. He tries to avoid salt, but is eating some salt rich foods such as sausage at home. He is weighing himself daily and has noticed his weight increasing but has not notified PCP or cardiology about this. He denies medical non-compliance.    In ER he was found to have BP of 110/61, K+ of 3.6. No acute cardiopulmonary disease per CXR. He was given furosemide 80 mg IV and potassium 20 mEq po. He has diuresed greater than 4 liters since admission. He is breathing and feeling better. Still complains of abdominal tightness and LEE although improved.    Allergies  Allergen Reactions  . Aspirin Other (See Comments)    Nausea and upset stomach      Medications Scheduled Medications: . atorvastatin  80 mg Oral QHS  . dorzolamide-timolol  1 drop Both Eyes BID  . furosemide  80 mg Intravenous Q12H  . latanoprost  1 drop Both Eyes QHS  . loratadine  10 mg Oral Daily  . metoprolol succinate  12.5 mg Oral q morning - 10a  . pantoprazole  40 mg Oral BID  . potassium chloride SA  20 mEq Oral BID  . sodium chloride  3 mL Intravenous Q12H  . sodium chloride  3 mL Intravenous Q12H  . tamsulosin  0.4  mg Oral QPC supper  . verapamil  120 mg Oral QHS    PRN Medications: sodium chloride, acetaminophen, acetaminophen, alum & mag hydroxide-simeth, cyclobenzaprine, docusate sodium, ondansetron (ZOFRAN) IV, ondansetron, oxyCODONE, oxyCODONE-acetaminophen, sodium chloride   Past Medical History  Diagnosis Date  . CHF (congestive heart failure)   . Obesity   . Hyperlipidemia   . PUD (peptic ulcer disease)   . GERD (gastroesophageal reflux disease)   . S/P endoscopy 2007, 2012    2007: nl, May 2012: antral erosions  . Osteoarthritis   . S/P colonoscopy 2007, Feb and March 2012    hx of adenomas, left-sided diverticula, On 07/2010 TCS, fresh blood and clot noted coming from TI.  Marland Kitchen Anxiety   . Hypothyroidism     hypothyroid  . Ringing in ears   . History of kidney stones   . History of bladder infections   . Heel spur     right heel  . Collagen vascular disease     history of venous insufficency and LE cellulitis  . Thrombocytopenia 12/30/2011    Stable  . BPH (benign prostatic hyperplasia)   . Hypertension     Sees Dr. Debara Pickett  . Bilateral lower leg cellulitis   . Sleep apnea     uses oxygen 2L via  cannot tolerate CPAP - sleep study 2008 AHI 48.80/hr and 56.70hr during REM  .  Complication of anesthesia     due to sleep apnea pt has had difficulty being put under anesthesia  . PONV (postoperative nausea and vomiting)   . Deep venous insufficiency   . Chronic diastolic HF (heart failure)     Past Surgical History  Procedure Laterality Date  . Knee arthroscopy    . Rotator cuff repair    . Cholecystectomy    . Lumbar spine surgery    . Small bowel capsule study  07/2011    ?transient focal ischemia in distal small bowel to explain GI bleeding  . Esophagogastroduodenoscopy  Aug 2013    [facility], single 72mm pedunculated polyp otherwise normal. HYPERPLASTIC, NEGATIVE h.pylori  . Cardiac catheterization  02/27/2007    no sign of CAD, LVH, mod pulm HTN (Dr. Jackie Plum(  .  Transurethral resection of prostate    . Transurethral resection of prostate N/A 06/18/2013    Procedure: TRANSURETHRAL RESECTION OF THE PROSTATE (TURP);  Surgeon: Marissa Nestle, MD;  Location: [facility];  Service: Urology;  Laterality: N/A;  . Transthoracic echocardiogram  09/2008    EF 60-65%, mod conc LVH  . Nm myocar perf wall motion  12/2006    dipyridamole myoview - stress images show mild perfusion defect in mid inferior walls with reversibility at rest; mild perfusion defect in mid inferolateral wall at stress with mild defect reversibility, EF 62%, abnormal but low risk study     Family History  Problem Relation Age of Onset  . Colon cancer Father 83    deceased  . Prostate cancer Father   . Pancreatic cancer Mother 45    deceased    Social History Mr. Ahlers reports that he quit smoking about 40 years ago. His smoking use included Cigars. He quit smokeless tobacco use about 40 years ago. Mr. Knick reports that he does not drink alcohol.  Review of Systems Otherwise reviewed and negative except as outlined.  Physical Examination Blood pressure 107/71, pulse 76, temperature 97.7 F (36.5 C), temperature source Oral, resp. rate 20, height 5\' 9"  (1.753 m), weight 300 lb 11.3 oz (136.4 kg), SpO2 94.00%.  Intake/Output Summary (Last 24 hours) at 08/21/13 0921 Last data filed at 08/21/13 0540  Gross per 24 hour  Intake    480 ml  Output   5150 ml  Net  -4670 ml    Telemetry: NSR   GEN: No acute distress HEENT: Conjunctiva and lids normal, oropharynx clear with moist mucosa. Neck: Supple, no elevated JVP or carotid bruits, no thyromegaly. Lungs: Clear to auscultation, nonlabored breathing at rest. Cardiac: Regular rate and rhythm, no S3 or significant systolic murmur, no pericardial rub. Abdomen: Soft, nontender, no hepatomegaly, bowel sounds present, no guarding or rebound. Extremities: No pitting edema, distal pulses 2+. Skin: Warm and dry. Musculoskeletal: No  kyphosis. Neuropsychiatric: Alert and oriented x3, affect grossly appropriate.  Prior Cardiac Testing/Procedures 1. Echocardiogram: 05/04/2013 Procedure narrative: Transthoracic echocardiography. Technically difficult study with reduced echocardiographic windows. - Left ventricle: The cavity size was normal. There was moderate concentric hypertrophy. Systolic function was vigorous. The estimated ejection fraction was in the range of 65% to 70%. Wall motion was normal; there were no regional wall motion abnormalities. Doppler parameters are consistent with abnormal left ventricular relaxation (grade 1 diastolic dysfunction). The E/e' ratio is <10, suggesting normal LV filling pressure. - Left atrium: The atrium was normal in size. - Tricuspid valve: Mild regurgitation. - Pericardium, extracardiac: There was no pericardial effusion.   Lab Results  Basic Metabolic  Panel:  Recent Labs Lab 08/20/13 1115 08/21/13 0446  NA 142 142  K 3.6* 4.1  CL 100 100  CO2 34* 35*  GLUCOSE 120* 148*  BUN 16 14  CREATININE 0.92 0.99  CALCIUM 9.4 9.3  MG 2.2  --     Liver Function Tests:  Recent Labs Lab 08/20/13 1115  AST 21  ALT 19  ALKPHOS 101  BILITOT 0.8  PROT 6.8  ALBUMIN 3.2*    CBC:  Recent Labs Lab 08/20/13 1115 08/21/13 0446  WBC 10.3 10.4  HGB 13.6 12.9*  HCT 41.7 40.1  MCV 79.4 80.2  PLT 125* 120*    Cardiac Enzymes:  Recent Labs Lab 08/20/13 1115  TROPONINI <0.30    Radiology: Dg Chest 2 View  08/20/2013   CLINICAL DATA:  Fluid retention.  Weight gain.  Shortness of breath.  EXAM: CHEST  2 VIEW  COMPARISON:  Tear prior  FINDINGS: Low volume chest with basilar atelectasis. The cardiopericardial silhouette appears within normal limits for volumes of inspiration. No airspace disease/effusion. No focal consolidation to suggest pneumonia.  IMPRESSION: Low volume chest.  No gross acute cardiopulmonary disease.   Electronically Signed   By: Dereck Ligas  M.D.   On: 08/20/2013 12:07     ECG: NSR with PVC's. Rate of 108.   Impression and Recommendations  1.Acute on Chronic Diastolic CHF: Likely related to dietary non-compliance with salty foods at home. He is noted to have weighed himself at home with wt gain, but no contact with PCP or cardiology. Followed in [place] by Dr. Debara Pickett and Kerin Ransom PA.  I have discussed with him necessity of low sodium diet, even though he does not add salt to his foods. He is requesting a low sodium diet instruction. He is not yet at baseline wt, with considerable abdominal distention remaining, but LEE has improved.   He remains on 80 mg of IV lasix BID. Home dose is lasix 80 mg TID. Consider changing to torsemide on discharge for better bioavailability.  Creatinine 0.99 this am.   2. Hypertension: Blood pressure is low normal at present. He is on verapamil currently can contribute to LEE.Consider changing to ARB or ACE instead.   3. Morbid obesity with Associated OSA: Unable to tolerate CPAP. Contributing to hypertension and probable elevated right heart pressures.  Signed: Phill Myron. Purcell Nails NP [facility] 08/21/2013, 9:21 AM Co-Sign MD  Attending Note Patient seen and discussed with NP Purcell Nails, I agree with her documentation above. 73 yo male history of chronic diastolic heart failure, HTN, obesity, hyperlipidemia, OSA admitted with SOB and increasing LE edema. Per prior clinic notes he also has a history of venous insufficiency, and he has been unable to tolerate stockings in the past.    Echo 04/2013 LVEF 86-76%, grade I diastolic dysfunction. CXR with no acute findings, EKG NSR with PVC, pro-BNP 10, Cr 0.92, K 3.6. He is net negative 6 liters since admission with stable renal function.   Overall presentation consistent with acute on chronic diastolic heart failure, possible exacerbating factor is diet non-adherence. Will decrease IV lasix to 40mg  bid, current rapid rate of diuresis increases  risk for AKI. He has been on both verapmil and Toprol XL for unclear reasons to me from the notes. Will defer this management to his primary cardiologist.    Carlyle Dolly MD

## 2013-08-21 NOTE — Progress Notes (Signed)
Pt seen and examined at bedside. Agree with assessment and plan by Dyanne Carrel, NP. Continue Lasix but will lower the dose to 40 mg IV BID. Weight today is 300 lbs, continue to monitor daily weights, I's/O's. Appreciate cardiology input. BMP in AM.  Faye Ramsay, MD  [facility] Pager [phone] Cell [phone]  If 7PM-7AM, please contact night-coverage [email or web address] Password [email or web address]

## 2013-08-22 DIAGNOSIS — I509 Heart failure, unspecified: Secondary | ICD-10-CM | POA: Diagnosis not present

## 2013-08-22 DIAGNOSIS — I5033 Acute on chronic diastolic (congestive) heart failure: Secondary | ICD-10-CM | POA: Diagnosis not present

## 2013-08-22 LAB — BASIC METABOLIC PANEL
BUN: 12 mg/dL (ref 6–23)
CALCIUM: 9.3 mg/dL (ref 8.4–10.5)
CO2: 35 meq/L — AB (ref 19–32)
Chloride: 99 mEq/L (ref 96–112)
Creatinine, Ser: 0.87 mg/dL (ref 0.50–1.35)
GFR calc Af Amer: >90 mL/min (ref >90)
GFR, EST NON AFRICAN AMERICAN: 84 mL/min — AB (ref >90)
Glucose, Bld: 127 mg/dL — ABNORMAL HIGH (ref 70–99)
Potassium: 4 mEq/L (ref 3.7–5.3)
SODIUM: 141 meq/L (ref 137–147)

## 2013-08-22 LAB — CBC
HEMATOCRIT: 41.7 % (ref 39.0–52.0)
Hemoglobin: 13.4 g/dL (ref 13.0–17.0)
MCH: 25.7 pg — ABNORMAL LOW (ref 26.0–34.0)
MCHC: 32.1 g/dL (ref 30.0–36.0)
MCV: 79.9 fL (ref 78.0–100.0)
PLATELETS: 115 10*3/uL — AB (ref 150–400)
RBC: 5.22 MIL/uL (ref 4.22–5.81)
RDW: 17.7 % — AB (ref 11.5–15.5)
WBC: 8.7 10*3/uL (ref 4.0–10.5)

## 2013-08-22 LAB — PRO B NATRIURETIC PEPTIDE: Pro B Natriuretic peptide (BNP): 11.3 pg/mL (ref 0–125)

## 2013-08-22 NOTE — Progress Notes (Signed)
UR chart review completed.  

## 2013-08-22 NOTE — Progress Notes (Signed)
Patient informed me that he has tried a CPAP in the past but unable to use due the mask discomfort. Patient wears a 2lpm [place] at night at home;i set up a [place] for bedtime. Patient would be willing to wear a CPAP if his breathing worsen but stated he would be more comfortable with just the cannula due the fact he has to go to the bathroom frequently through the night.

## 2013-08-22 NOTE — Progress Notes (Signed)
Patient is currently active with [facility] for chronic disease management services.  Patient has been engaged by a [employer].  Our community based plan of care has focused on disease management of CHF, medication procurement/adherence, and community resource support.  According to [facility] patient has expressed that he is really struggling with his wife's passing in recent months.  This has declined his desire for health compliance and may be contributing to the recent readmission pattern.  We will add a LCSW referral to our team intervention as needed based on our next home visit assessment.  Patient will receive a post discharge transition of care call and will be evaluated for monthly home visits for assessments and disease process education.  Made Inpatient Case Manager Christinia Gully aware that [facility] following. Of note, [facility] services does not replace or interfere with any services that are arranged by inpatient case management or social work.  For additional questions or referrals please contact Corliss Blacker BSN RN [facility] Liaison at [phone].

## 2013-08-22 NOTE — Progress Notes (Signed)
Consulting cardiologist: Carlyle Dolly MD Primary Cardiologist: Hilty, [place] MD  Subjective:    Breathing better. Didn't sleep well last night. No chest pain.   Objective:   Temp:  [97.5 F (36.4 C)-97.7 F (36.5 C)] 97.7 F (36.5 C) (04/02 0636) Pulse Rate:  [79-83] 79 (04/02 0636) Resp:  [18-20] 18 (04/02 0636) BP: (115-149)/(67-80) 137/67 mmHg (04/02 0636) SpO2:  [96 %-98 %] 98 % (04/02 0636) Weight:  [298 lb 11.6 oz (135.5 kg)] 298 lb 11.6 oz (135.5 kg) (04/02 0352) Last BM Date: 08/20/13  Filed Weights   08/20/13 1400 08/21/13 0537 08/22/13 0352  Weight: 298 lb 6.4 oz (135.353 kg) 300 lb 11.3 oz (136.4 kg) 298 lb 11.6 oz (135.5 kg)    Intake/Output Summary (Last 24 hours) at 08/22/13 0904 Last data filed at 08/22/13 1610  Gross per 24 hour  Intake    480 ml  Output   3800 ml  Net  -3320 ml    Telemetry: Sinus rhythm rates in the 80's-90's.   Exam:  NSR rates in the 90's.   General: No acute distress.  HEENT: Conjunctiva and lids normal, oropharynx clear.  Lungs: Clear to auscultation, nonlabored.  Cardiac: No elevated JVP or bruits. RRR, no gallop or rub.   Abdomen: Normoactive bowel sounds, nontender, nondistended.  Extremities: Mild dependent, pitting edema, distal pulses full.  Neuropsychiatric: Alert and oriented x3, affect appropriate.   Lab Results:  Basic Metabolic Panel:  Recent Labs Lab 08/20/13 1115 08/21/13 0446 08/22/13 0503  NA 142 142 141  K 3.6* 4.1 4.0  CL 100 100 99  CO2 34* 35* 35*  GLUCOSE 120* 148* 127*  BUN 16 14 12   CREATININE 0.92 0.99 0.87  CALCIUM 9.4 9.3 9.3  MG 2.2  --   --     Liver Function Tests:  Recent Labs Lab 08/20/13 1115  AST 21  ALT 19  ALKPHOS 101  BILITOT 0.8  PROT 6.8  ALBUMIN 3.2*    CBC:  Recent Labs Lab 08/20/13 1115 08/21/13 0446 08/22/13 0503  WBC 10.3 10.4 8.7  HGB 13.6 12.9* 13.4  HCT 41.7 40.1 41.7  MCV 79.4 80.2 79.9  PLT 125* 120* 115*    Cardiac  Enzymes:  Recent Labs Lab 08/20/13 1115  TROPONINI <0.30    BNP:  Recent Labs  05/25/13 1239 08/20/13 1115 08/22/13 0503  PROBNP 26.1 10.0 11.3    Coagulation:  Recent Labs Lab 08/20/13 1115  INR 1.00    Radiology: Dg Chest 2 View  08/20/2013   CLINICAL DATA:  Fluid retention.  Weight gain.  Shortness of breath.  EXAM: CHEST  2 VIEW  COMPARISON:  Tear prior  FINDINGS: Low volume chest with basilar atelectasis. The cardiopericardial silhouette appears within normal limits for volumes of inspiration. No airspace disease/effusion. No focal consolidation to suggest pneumonia.  IMPRESSION: Low volume chest.  No gross acute cardiopulmonary disease.   Electronically Signed   By: Dereck Ligas M.D.   On: 08/20/2013 12:07      Medications:   Scheduled Medications: . atorvastatin  80 mg Oral QHS  . dorzolamide-timolol  1 drop Both Eyes BID  . furosemide  40 mg Intravenous Q12H  . latanoprost  1 drop Both Eyes QHS  . loratadine  10 mg Oral Daily  . metoprolol succinate  12.5 mg Oral q morning - 10a  . pantoprazole  40 mg Oral BID  . potassium chloride SA  20 mEq Oral BID  . sodium chloride  3 mL Intravenous Q12H  . sodium chloride  3 mL Intravenous Q12H  . tamsulosin  0.4 mg Oral QPC supper  . verapamil  120 mg Oral QHS    Infusions:    PRN Medications: sodium chloride, acetaminophen, acetaminophen, alum & mag hydroxide-simeth, cyclobenzaprine, docusate sodium, ondansetron (ZOFRAN) IV, ondansetron, oxyCODONE, oxyCODONE-acetaminophen, sodium chloride   Assessment and Plan:   1. Acute on Chronic Diastolic CHF: He is feeling better but not sleeping well. His lasix has been decreased to 40 mg IV BID, Creatinine 0.87. Home dose of lasix 80 mg TID. Will transition to po tomorrow. Dry wt is 294 lbs, currently at 298 lbs. Low sodium diet at home.  2. Hypertension: Currently well controlled. He is on verapamil and metoprolol.   3. Morbid Obesity with OSA:  Being fitted for  CPAP mask that he can tolerate.   Phill Myron. Purcell Nails NP [facility] 08/22/2013, 9:04 AM  Attending Note Patient seen and discussed with NP Purcell Nails, I agree with her documentation above. He is net negative 2.4 liters yesterday and 7.5 liters since admission, Cr and BUN remain stable. Still has evidence of volume overload. Continue IV lasix today at current dose, potentially change to oral tomorrow. I would recommend changing home lasix to torsemide for more potent and reliable diuretic effect.   Carlyle Dolly MD

## 2013-08-22 NOTE — Progress Notes (Signed)
Pt seen and examined at bedside. He reports feeling better this AM but did not get much sleep last night. I agree with assessment and plan by Dyanne Carrel, NP. Appreciate cardiology input. Monitor weight, I's/O's, repeat BMP in AM.   Faye Ramsay, MD  [facility] Pager [phone] Cell [phone]  If 7PM-7AM, please contact night-coverage [email or web address] Password [email or web address]

## 2013-08-22 NOTE — Progress Notes (Signed)
Patient given info on low sodium diet and educated using teachback.  Patient able to verbalize low sodium choices VS foods he should avoid.  Examples of meals given with care notes.  Patient also knowledgeable of daily weight monitoring.

## 2013-08-22 NOTE — Progress Notes (Signed)
[facility] PROGRESS NOTE  [name] [name] FAO:[id number] DOB: 1940/05/25 DOA: 08/20/2013 PCP: [name]., MD  Assessment/Plan: Principal Problem:  Acute on chronic diastolic CHF: Related to suspected pulmonary hypertension in the setting of obstructive sleep apnea noncompliance with CPAP. Volume status -7.5L this am. Lasix dose reduced per cards. Appreciate assistance. Continue IV lasix one more day per cardiology as patient not at dry weight yet. Kidney function preserved. continue a low-sodium diet and fluid restriction as well. He had an echo done December 2014 which yielded an ejection fraction of 65-70% and grade 1 diastolic dysfunction. Will not repeat echo at this time.  Active Problems:  HYPERTENSION: Controlled. Home medications include Toprol. Will continue this.   GASTROESOPHAGEAL REFLUX DISEASE, CHRONIC: Stable at baseline   Thrombocytopenia: Chronic. Chart review indicates his current level of 125 is close to baseline   Hyperlipidemia: Continue home medication   Morbid obesity- BMI 44.5: Nutritional consult  Edema of both legs: Chronic in setting of diastolic heart failure. Please see #1.   OSA: unable to tolerate CPAP in past. Wants to try again. Will request respiratory consult.    Code Status: full Family Communication: present Disposition Plan: home when ready    Consultants:  cardiology  Procedures:  none  Antibiotics:  none  HPI/Subjective: Sitting up in bed eating breakfast. Reports feeling "much better than when i came in".  Objective: Filed Vitals:   08/22/13 0636  BP: 137/67  Pulse: 79  Temp: 97.7 F (36.5 C)  Resp: 18    Intake/Output Summary (Last 24 hours) at 08/22/13 0939 Last data filed at 08/22/13 0912  Gross per 24 hour  Intake    720 ml  Output   3800 ml  Net  -3080 ml   Filed Weights   08/20/13 1400 08/21/13 0537 08/22/13 0352  Weight: 135.353 kg (298 lb 6.4 oz) 136.4 kg (300 lb 11.3 oz) 135.5 kg (298 lb 11.6  oz)    Exam:   General:  Obese NAD  Cardiovascular: RRR No MGR trace LE edema  Respiratory: normal effort BS clear bilaterally no wheeze no crackles  Abdomen: obese soft non-tender to palpation. +BS  Musculoskeletal: no clubbing or cyanosis   Data Reviewed: Basic Metabolic Panel:  Recent Labs Lab 08/20/13 1115 08/21/13 0446 08/22/13 0503  NA 142 142 141  K 3.6* 4.1 4.0  CL 100 100 99  CO2 34* 35* 35*  GLUCOSE 120* 148* 127*  BUN 16 14 12   CREATININE 0.92 0.99 0.87  CALCIUM 9.4 9.3 9.3  MG 2.2  --   --    Liver Function Tests:  Recent Labs Lab 08/20/13 1115  AST 21  ALT 19  ALKPHOS 101  BILITOT 0.8  PROT 6.8  ALBUMIN 3.2*   No results found for this basename: LIPASE, AMYLASE,  in the last 168 hours No results found for this basename: AMMONIA,  in the last 168 hours CBC:  Recent Labs Lab 08/20/13 1115 08/21/13 0446 08/22/13 0503  WBC 10.3 10.4 8.7  HGB 13.6 12.9* 13.4  HCT 41.7 40.1 41.7  MCV 79.4 80.2 79.9  PLT 125* 120* 115*   Cardiac Enzymes:  Recent Labs Lab 08/20/13 1115  TROPONINI <0.30   BNP (last 3 results)  Recent Labs  05/25/13 1239 08/20/13 1115 08/22/13 0503  PROBNP 26.1 10.0 11.3   CBG: No results found for this basename: GLUCAP,  in the last 168 hours  No results found for this or any previous visit (from the past 240 hour(s)).  Studies: Dg Chest 2 View  08/20/2013   CLINICAL DATA:  Fluid retention.  Weight gain.  Shortness of breath.  EXAM: CHEST  2 VIEW  COMPARISON:  Tear prior  FINDINGS: Low volume chest with basilar atelectasis. The cardiopericardial silhouette appears within normal limits for volumes of inspiration. No airspace disease/effusion. No focal consolidation to suggest pneumonia.  IMPRESSION: Low volume chest.  No gross acute cardiopulmonary disease.   Electronically Signed   By: Dereck Ligas M.D.   On: 08/20/2013 12:07    Scheduled Meds: . atorvastatin  80 mg Oral QHS  . dorzolamide-timolol  1  drop Both Eyes BID  . furosemide  40 mg Intravenous Q12H  . latanoprost  1 drop Both Eyes QHS  . loratadine  10 mg Oral Daily  . metoprolol succinate  12.5 mg Oral q morning - 10a  . pantoprazole  40 mg Oral BID  . potassium chloride SA  20 mEq Oral BID  . sodium chloride  3 mL Intravenous Q12H  . sodium chloride  3 mL Intravenous Q12H  . tamsulosin  0.4 mg Oral QPC supper  . verapamil  120 mg Oral QHS   Continuous Infusions:   Principal Problem:   Acute on chronic diastolic CHF Active Problems:   HYPERTENSION   GASTROESOPHAGEAL REFLUX DISEASE, CHRONIC   Thrombocytopenia   Hyperlipidemia   OSA (obstructive sleep apnea)   Morbid obesity- BMI 44.5   Edema of both legs   Right-sided heart failure   Acute on chronic diastolic heart failure    Time spent: 30 minutes    [facility] Pager [phone]. If 7PM-7AM, please contact night-coverage at [email or web address], password [facility] 08/22/2013, 9:39 AM  LOS: 2 days

## 2013-08-23 DIAGNOSIS — R630 Anorexia: Secondary | ICD-10-CM | POA: Diagnosis not present

## 2013-08-23 DIAGNOSIS — I2510 Atherosclerotic heart disease of native coronary artery without angina pectoris: Secondary | ICD-10-CM

## 2013-08-23 DIAGNOSIS — I5033 Acute on chronic diastolic (congestive) heart failure: Secondary | ICD-10-CM | POA: Diagnosis not present

## 2013-08-23 DIAGNOSIS — I509 Heart failure, unspecified: Secondary | ICD-10-CM | POA: Diagnosis not present

## 2013-08-23 LAB — BASIC METABOLIC PANEL
BUN: 14 mg/dL (ref 6–23)
CHLORIDE: 99 meq/L (ref 96–112)
CO2: 33 mEq/L — ABNORMAL HIGH (ref 19–32)
Calcium: 9.6 mg/dL (ref 8.4–10.5)
Creatinine, Ser: 0.88 mg/dL (ref 0.50–1.35)
GFR calc non Af Amer: 83 mL/min — ABNORMAL LOW (ref >90)
Glucose, Bld: 121 mg/dL — ABNORMAL HIGH (ref 70–99)
Potassium: 4.1 mEq/L (ref 3.7–5.3)
Sodium: 140 mEq/L (ref 137–147)

## 2013-08-23 MED ORDER — TORSEMIDE 20 MG PO TABS
60.0000 mg | ORAL_TABLET | Freq: Two times a day (BID) | ORAL | Status: DC
  Administered 2013-08-23 – 2013-08-24 (×3): 60 mg via ORAL
  Filled 2013-08-23 (×3): qty 3

## 2013-08-23 MED ORDER — METOPROLOL SUCCINATE ER 25 MG PO TB24
25.0000 mg | ORAL_TABLET | Freq: Every morning | ORAL | Status: DC
  Administered 2013-08-23 – 2013-08-24 (×2): 25 mg via ORAL
  Filled 2013-08-23 (×2): qty 1

## 2013-08-23 NOTE — Progress Notes (Signed)
Patient ID: [name] [name], male   DOB: 05/20/1941, 73 y.o.   MRN: [id number] [facility] PROGRESS NOTE  Braxen Dobek XBM:[id number] DOB: 02-Oct-1940 DOA: 08/20/2013 PCP: [name]., MD  Brief narrative: 73 y.o. male with a past medical history of right-sided congestive heart failure, prostatitis, obesity, lower extremity edema presented to the emergency department with main concern of several days duration of progressively worsening dyspnea at rest as well as with exertion, lower extremity edema, more than 15 pounds weight gain. He has denied chest pain, abdominal or urinary concerns. In emergency department, patient noted to be volume overloaded with +2 lower extremity pitting edema, bibasilar crackles, exam findings consistent with pulmonary vascular congestion. Patient was given 80 mg Lasix IV and hospitalist team asked to admit for further evaluation and management of CHF.  Assessment/Plan:  Principal Problem:  Acute on chronic diastolic CHF - Patient is clinically improving, weight is trending down : 305 lbs on admission  --> 295 pounds this morning  - Maintaining good urine output, creatinine remains stable and within normal limits  - Appreciate cardiology input, transition to Torsemide  - Possible DC in the morning Active Problems:  HYPERTENSION: Reasonable inpatient control, continue metoprolol GASTROESOPHAGEAL REFLUX DISEASE, CHRONIC: Stable at baseline  Thrombocytopenia: Chronic. Chart review indicates his current level is close to baseline  Hyperlipidemia: Continue home medication  Morbid obesity- BMI 44.5: Nutritional consult  OSA: unable to tolerate CPAP in past. Wants to try again.   Code Status: full  Family Communication: pt at bedside  Disposition Plan: home in AM  Consultants:   Cardiology  Procedures:  None  Antibiotics:  None   HPI/Subjective: No events overnight.   Objective: Filed Vitals:   08/22/13 0636 08/22/13 1419 08/22/13 2153 08/23/13  0533  BP: 137/67 114/77 149/89 134/81  Pulse: 79 7 84 88  Temp: 97.7 F (36.5 C) 97.6 F (36.4 C) 97.6 F (36.4 C) 97.5 F (36.4 C)  TempSrc: Oral Oral Oral Oral  Resp: 18 20 16 16   Height:      Weight:    134 kg (295 lb 6.7 oz)  SpO2: 98% 93% 95% 92%    Intake/Output Summary (Last 24 hours) at 08/23/13 1132 Last data filed at 08/22/13 2300  Gross per 24 hour  Intake    240 ml  Output   2075 ml  Net  -1835 ml    Exam:   General:  Pt is alert, follows commands appropriately, not in acute distress  Cardiovascular: Regular rate and rhythm, S1/S2, no murmurs, no rubs, no gallops  Respiratory: Clear to auscultation bilaterally, no wheezing, no crackles, no rhonchi  Abdomen: Soft, non tender, non distended, bowel sounds present, no guarding  Extremities: +1 bilateral lower extremity pitting edema, bilateral chronic venous stasis changes in lower extremities  Neuro: Grossly nonfocal  Data Reviewed: Basic Metabolic Panel:  Recent Labs Lab 08/20/13 1115 08/21/13 0446 08/22/13 0503 08/23/13 0444  NA 142 142 141 140  K 3.6* 4.1 4.0 4.1  CL 100 100 99 99  CO2 34* 35* 35* 33*  GLUCOSE 120* 148* 127* 121*  BUN 16 14 12 14   CREATININE 0.92 0.99 0.87 0.88  CALCIUM 9.4 9.3 9.3 9.6  MG 2.2  --   --   --    Liver Function Tests:  Recent Labs Lab 08/20/13 1115  AST 21  ALT 19  ALKPHOS 101  BILITOT 0.8  PROT 6.8  ALBUMIN 3.2*   CBC:  Recent Labs Lab 08/20/13 1115 08/21/13 0446 08/22/13  0503  WBC 10.3 10.4 8.7  HGB 13.6 12.9* 13.4  HCT 41.7 40.1 41.7  MCV 79.4 80.2 79.9  PLT 125* 120* 115*   Cardiac Enzymes:  Recent Labs Lab 08/20/13 1115  TROPONINI <0.30   Scheduled Meds: . atorvastatin  80 mg Oral QHS  . dorzolamide-timolol  1 drop Both Eyes BID  . latanoprost  1 drop Both Eyes QHS  . loratadine  10 mg Oral Daily  . metoprolol succinate  25 mg Oral q morning - 10a  . pantoprazole  40 mg Oral BID  . potassium chloride SA  20 mEq Oral BID  .  sodium chloride  3 mL Intravenous Q12H  . sodium chloride  3 mL Intravenous Q12H  . tamsulosin  0.4 mg Oral QPC supper  . torsemide  60 mg Oral BID  . verapamil  120 mg Oral QHS   Continuous Infusions:  Faye Ramsay, MD  [facility] Pager [phone]  If 7PM-7AM, please contact night-coverage [email or web address] Password [email or web address] 08/23/2013, 11:32 AM   LOS: 3 days

## 2013-08-23 NOTE — Progress Notes (Signed)
Consulting cardiologist:Brynne Doane, Nevin Bloodgood MD Primary Cardiologist: Hilty, [place] MD  Subjective:    Feeling " a whole lot better!"   Objective:   Temp:  [97.5 F (36.4 C)-97.6 F (36.4 C)] 97.5 F (36.4 C) (04/03 0533) Pulse Rate:  [7-88] 88 (04/03 0533) Resp:  [16-20] 16 (04/03 0533) BP: (114-149)/(77-89) 134/81 mmHg (04/03 0533) SpO2:  [92 %-95 %] 92 % (04/03 0533) Weight:  [295 lb 6.7 oz (134 kg)] 295 lb 6.7 oz (134 kg) (04/03 0533) Last BM Date: 08/21/13  Filed Weights   08/21/13 0537 08/22/13 0352 08/23/13 0533  Weight: 300 lb 11.3 oz (136.4 kg) 298 lb 11.6 oz (135.5 kg) 295 lb 6.7 oz (134 kg)    Intake/Output Summary (Last 24 hours) at 08/23/13 0901 Last data filed at 08/22/13 2300  Gross per 24 hour  Intake    480 ml  Output   2225 ml  Net  -1745 ml   I/O since admit.  -9.5 L  Telemetry: NSR  Exam:  General: No acute distress.  HEENT: Conjunctiva and lids normal, oropharynx clear.  Neck  Difficult to assess JVP   Lungs: Clear to auscultation, nonlabored.  Cardiac:  RRR, no gallop or rub.   Abdomen: Normoactive bowel sounds, nontender, nondistended.  Extremities: 1+ pitting edema, distal pulses full. Venous statis skin changes, with thickening.  Neuropsychiatric: Alert and oriented x3, affect appropriate.   Lab Results:  Basic Metabolic Panel:  Recent Labs Lab 08/20/13 1115 08/21/13 0446 08/22/13 0503 08/23/13 0444  NA 142 142 141 140  K 3.6* 4.1 4.0 4.1  CL 100 100 99 99  CO2 34* 35* 35* 33*  GLUCOSE 120* 148* 127* 121*  BUN 16 14 12 14   CREATININE 0.92 0.99 0.87 0.88  CALCIUM 9.4 9.3 9.3 9.6  MG 2.2  --   --   --     Liver Function Tests:  Recent Labs Lab 08/20/13 1115  AST 21  ALT 19  ALKPHOS 101  BILITOT 0.8  PROT 6.8  ALBUMIN 3.2*    CBC:  Recent Labs Lab 08/20/13 1115 08/21/13 0446 08/22/13 0503  WBC 10.3 10.4 8.7  HGB 13.6 12.9* 13.4  HCT 41.7 40.1 41.7  MCV 79.4 80.2 79.9  PLT 125* 120* 115*    Cardiac  Enzymes:  Recent Labs Lab 08/20/13 1115  TROPONINI <0.30    BNP:  Recent Labs  05/25/13 1239 08/20/13 1115 08/22/13 0503  PROBNP 26.1 10.0 11.3    Coagulation:  Recent Labs Lab 08/20/13 1115  INR 1.00    Radiology: No results found.   ECG:   Medications:   Scheduled Medications: . atorvastatin  80 mg Oral QHS  . dorzolamide-timolol  1 drop Both Eyes BID  . furosemide  40 mg Intravenous Q12H  . latanoprost  1 drop Both Eyes QHS  . loratadine  10 mg Oral Daily  . metoprolol succinate  12.5 mg Oral q morning - 10a  . pantoprazole  40 mg Oral BID  . potassium chloride SA  20 mEq Oral BID  . sodium chloride  3 mL Intravenous Q12H  . sodium chloride  3 mL Intravenous Q12H  . tamsulosin  0.4 mg Oral QPC supper  . verapamil  120 mg Oral QHS    Infusions:    PRN Medications: sodium chloride, acetaminophen, acetaminophen, alum & mag hydroxide-simeth, cyclobenzaprine, docusate sodium, ondansetron (ZOFRAN) IV, ondansetron, oxyCODONE, oxyCODONE-acetaminophen, sodium chloride   Assessment and Plan:   1. Acute on Chronic Diastolic CHF:  He has done well with diureses. Continues about a liter a day. Creatinine stable. Will transition him to po diuretics this am. Dry wt 294 lbs. He is 295 lbs to day. He will get his am IV dose of lasix  He is recommended to change to torsemide 60 mg BID,  as this has better results and bioavailability when he follows up with Dr. Debara Pickett. Low sodium diet is provided for him in writing.   2. CAD: Minor CAD per cardiac cath in 2008. He already has follow up appt with Dr. Debara Pickett planned on discharge   3. OSA: Is being fitted for CPAP mask to assist with compliance  Phill Myron. Purcell Nails NP [facility] 08/23/2013, 9:01 AM  Patinet seen and examined.  I have amended note to reflect my findings.  Agree with assessment as above by Arnold Long  Getting near baseline.  Transition to PO   Has appt with C Hilty on Tues  Will keep.    Dorris Carnes

## 2013-08-24 DIAGNOSIS — I5033 Acute on chronic diastolic (congestive) heart failure: Secondary | ICD-10-CM | POA: Diagnosis not present

## 2013-08-24 DIAGNOSIS — I509 Heart failure, unspecified: Secondary | ICD-10-CM | POA: Diagnosis not present

## 2013-08-24 LAB — BASIC METABOLIC PANEL
BUN: 14 mg/dL (ref 6–23)
CALCIUM: 9.1 mg/dL (ref 8.4–10.5)
CO2: 36 mEq/L — ABNORMAL HIGH (ref 19–32)
CREATININE: 1.13 mg/dL (ref 0.50–1.35)
Chloride: 95 mEq/L — ABNORMAL LOW (ref 96–112)
GFR calc non Af Amer: 63 mL/min — ABNORMAL LOW (ref >90)
GFR, EST AFRICAN AMERICAN: 73 mL/min — AB (ref >90)
Glucose, Bld: 131 mg/dL — ABNORMAL HIGH (ref 70–99)
Potassium: 4 mEq/L (ref 3.7–5.3)
SODIUM: 141 meq/L (ref 137–147)

## 2013-08-24 MED ORDER — OXYCODONE-ACETAMINOPHEN 10-325 MG PO TABS: 1.0000 | ORAL_TABLET | Freq: Four times a day (QID) | ORAL | Status: DC | PRN

## 2013-08-24 MED ORDER — TORSEMIDE 20 MG PO TABS: 40.0000 mg | ORAL_TABLET | Freq: Two times a day (BID) | ORAL | Status: DC

## 2013-08-24 MED ORDER — METOPROLOL SUCCINATE ER 25 MG PO TB24: 25.0000 mg | ORAL_TABLET | Freq: Every morning | ORAL | Status: DC

## 2013-08-24 NOTE — Discharge Instructions (Signed)
Heart Failure °Heart failure is a condition in which the heart has trouble pumping blood. This means your heart does not pump blood efficiently for your body to work well. In some cases of heart failure, fluid may back up into your lungs or you may have swelling (edema) in your lower legs. Heart failure is usually a long-term (chronic) condition. It is important for you to take good care of yourself and follow your caregiver's treatment plan. °CAUSES  °Some health conditions can cause heart failure. Those health conditions include: °· High blood pressure (hypertension) causes the heart muscle to work harder than normal. When pressure in the blood vessels is high, the heart needs to pump (contract) with more force in order to circulate blood throughout the body. High blood pressure eventually causes the heart to become stiff and weak. °· Coronary artery disease (CAD) is the buildup of cholesterol and fat (plaque) in the arteries of the heart. The blockage in the arteries deprives the heart muscle of oxygen and blood. This can cause chest pain and may lead to a heart attack. High blood pressure can also contribute to CAD. °· Heart attack (myocardial infarction) occurs when 1 or more arteries in the heart become blocked. The loss of oxygen damages the muscle tissue of the heart. When this happens, part of the heart muscle dies. The injured tissue does not contract as well and weakens the heart's ability to pump blood. °· Abnormal heart valves can cause heart failure when the heart valves do not open and close properly. This makes the heart muscle pump harder to keep the blood flowing. °· Heart muscle disease (cardiomyopathy or myocarditis) is damage to the heart muscle from a variety of causes. These can include drug or alcohol abuse, infections, or unknown reasons. These can increase the risk of heart failure. °· Lung disease makes the heart work harder because the lungs do not work properly. This can cause a strain  on the heart, leading it to fail. °· Diabetes increases the risk of heart failure. High blood sugar contributes to high fat (lipid) levels in the blood. Diabetes can also cause slow damage to tiny blood vessels that carry important nutrients to the heart muscle. When the heart does not get enough oxygen and food, it can cause the heart to become weak and stiff. This leads to a heart that does not contract efficiently. °· Other conditions can contribute to heart failure. These include abnormal heart rhythms, thyroid problems, and low blood counts (anemia). °Certain unhealthy behaviors can increase the risk of heart failure. Those unhealthy behaviors include: °· Being overweight. °· Smoking or chewing tobacco. °· Eating foods high in fat and cholesterol. °· Abusing illicit drugs or alcohol. °· Lacking physical activity. °SYMPTOMS  °Heart failure symptoms may vary and can be hard to detect. Symptoms may include: °· Shortness of breath with activity, such as climbing stairs. °· Persistent cough. °· Swelling of the feet, ankles, legs, or abdomen. °· Unexplained weight gain. °· Difficulty breathing when lying flat (orthopnea). °· Waking from sleep because of the need to sit up and get more air. °· Rapid heartbeat. °· Fatigue and loss of energy. °· Feeling lightheaded, dizzy, or close to fainting. °· Loss of appetite. °· Nausea. °· Increased urination during the night (nocturia). °DIAGNOSIS  °A diagnosis of heart failure is based on your history, symptoms, physical examination, and diagnostic tests. °Diagnostic tests for heart failure may include: °· Echocardiography. °· Electrocardiography. °· Chest X-ray. °· Blood tests. °· Exercise   stress test. °· Cardiac angiography. °· Radionuclide scans. °TREATMENT  °Treatment is aimed at managing the symptoms of heart failure. Medicines, behavioral changes, or surgical intervention may be necessary to treat heart failure. °· Medicines to help treat heart failure may  include: °· Angiotensin-converting enzyme (ACE) inhibitors. This type of medicine blocks the effects of a blood protein called angiotensin-converting enzyme. ACE inhibitors relax (dilate) the blood vessels and help lower blood pressure. °· Angiotensin receptor blockers. This type of medicine blocks the actions of a blood protein called angiotensin. Angiotensin receptor blockers dilate the blood vessels and help lower blood pressure. °· Water pills (diuretics). Diuretics cause the kidneys to remove salt and water from the blood. The extra fluid is removed through urination. This loss of extra fluid lowers the volume of blood the heart pumps. °· Beta blockers. These prevent the heart from beating too fast and improve heart muscle strength. °· Digitalis. This increases the force of the heartbeat. °· Healthy behavior changes include: °· Obtaining and maintaining a healthy weight. °· Stopping smoking or chewing tobacco. °· Eating heart healthy foods. °· Limiting or avoiding alcohol. °· Stopping illicit drug use. °· Physical activity as directed by your caregiver. °· Surgical treatment for heart failure may include: °· A procedure to open blocked arteries, repair damaged heart valves, or remove damaged heart muscle tissue. °· A pacemaker to improve heart muscle function and control certain abnormal heart rhythms. °· An internal cardioverter defibrillator to treat certain serious abnormal heart rhythms. °· A left ventricular assist device to assist the pumping ability of the heart. °HOME CARE INSTRUCTIONS  °· Take your medicine as directed by your caregiver. Medicines are important in reducing the workload of your heart, slowing the progression of heart failure, and improving your symptoms. °· Do not stop taking your medicine unless directed by your caregiver. °· Do not skip any dose of medicine. °· Refill your prescriptions before you run out of medicine. Your medicines are needed every day. °· Take over-the-counter  medicine only as directed by your caregiver or pharmacist. °· Engage in moderate physical activity if directed by your caregiver. Moderate physical activity can benefit some people. The elderly and people with severe heart failure should consult with a caregiver for physical activity recommendations. °· Eat heart healthy foods. Food choices should be free of trans fat and low in saturated fat, cholesterol, and salt (sodium). Healthy choices include fresh or frozen fruits and vegetables, fish, lean meats, legumes, fat-free or low-fat dairy products, and whole grain or high fiber foods. Talk to a dietitian to learn more about heart healthy foods. °· Limit sodium if directed by your caregiver. Sodium restriction may reduce symptoms of heart failure in some people. Talk to a dietitian to learn more about heart healthy seasonings. °· Use healthy cooking methods. Healthy cooking methods include roasting, grilling, broiling, baking, poaching, steaming, or stir-frying. Talk to a dietitian to learn more about healthy cooking methods. °· Limit fluids if directed by your caregiver. Fluid restriction may reduce symptoms of heart failure in some people. °· Weigh yourself every day. Daily weights are important in the early recognition of excess fluid. You should weigh yourself every morning after you urinate and before you eat breakfast. Wear the same amount of clothing each time you weigh yourself. Record your daily weight. Provide your caregiver with your weight record. °· Monitor and record your blood pressure if directed by your caregiver. °· Check your pulse if directed by your caregiver. °· Lose weight if directed   by your caregiver. Weight loss may reduce symptoms of heart failure in some people. °· Stop smoking or chewing tobacco. Nicotine makes your heart work harder by causing your blood vessels to constrict. Do not use nicotine gum or patches before talking to your caregiver. °· Schedule and attend follow-up visits as  directed by your caregiver. It is important to keep all your appointments. °· Limit alcohol intake to no more than 1 drink per day for nonpregnant women and 2 drinks per day for men. Drinking more than that is harmful to your heart. Tell your caregiver if you drink alcohol several times a week. Talk with your caregiver about whether alcohol is safe for you. If your heart has already been damaged by alcohol or you have severe heart failure, drinking alcohol should be stopped completely. °· Stop illicit drug use. °· Stay up-to-date with immunizations. It is especially important to prevent respiratory infections through current pneumococcal and influenza immunizations. °· Manage other health conditions such as hypertension, diabetes, thyroid disease, or abnormal heart rhythms as directed by your caregiver. °· Learn to manage stress. °· Plan rest periods when fatigued. °· Learn strategies to manage high temperatures. If the weather is extremely hot: °· Avoid vigorous physical activity. °· Use air conditioning or fans or seek a cooler location. °· Avoid caffeine and alcohol. °· Wear loose-fitting, lightweight, and light-colored clothing. °· Learn strategies to manage cold temperatures. If the weather is extremely cold: °· Avoid vigorous physical activity. °· Layer clothes. °· Wear mittens or gloves, a hat, and a scarf when going outside. °· Avoid alcohol. °· Obtain ongoing education and support as needed. °· Participate or seek rehabilitation as needed to maintain or improve independence and quality of life. °SEEK MEDICAL CARE IF:  °· Your weight increases by 03 lb/1.4 kg in 1 day or 05 lb/2.3 kg in a week. °· You have increasing shortness of breath that is unusual for you. °· You are unable to participate in your usual physical activities. °· You tire easily. °· You cough more than normal, especially with physical activity. °· You have any or more swelling in areas such as your hands, feet, ankles, or abdomen. °· You  are unable to sleep because it is hard to breathe. °· You feel like your heart is beating fast (palpitations). °· You become dizzy or lightheaded upon standing up. °SEEK IMMEDIATE MEDICAL CARE IF:  °· You have difficulty breathing. °· There is a change in mental status such as decreased alertness or difficulty with concentration. °· You have a pain or discomfort in your chest. °· You have an episode of fainting (syncope). °MAKE SURE YOU:  °· Understand these instructions. °· Will watch your condition. °· Will get help right away if you are not doing well or get worse. °Document Released: 05/09/2005 Document Revised: 09/03/2012 Document Reviewed: 05/31/2012 °[employer]® Patient Information ©2014 [employer]. ° °

## 2013-08-24 NOTE — Progress Notes (Signed)
Plan for d/c today if cardiology agrees, weight today is 291 lbs (dry weight is 294 lbs). Possible d/c on torsemide but lower the dose from 60 mg to 40 mg BID upon discharge if cardio team agrees, will follow up on recommendations.   Faye Ramsay, MD  [facility] Pager [phone] Cell [phone]  If 7PM-7AM, please contact night-coverage [email or web address] Password [email or web address]

## 2013-08-24 NOTE — Plan of Care (Signed)
Problem: Discharge Progression Outcomes Goal: If EF < 40% ACEI/ARB addressed at discharge Outcome: Completed/Met Date Met:  08/24/13 65-70% Goal: Activity appropriate for discharge plan Outcome: Completed/Met Date Met:  08/24/13 Ambulating in halls

## 2013-08-24 NOTE — Discharge Summary (Signed)
Physician Discharge Summary  [name] [name] NWG:[id number] DOB: 07/09/1940 DOA: 08/20/2013  PCP: [name]., MD  Admit date: 08/20/2013 Discharge date: 08/24/2013  Recommendations for Outpatient Follow-up:  1. Pt will need to follow up with PCP in 2-3 weeks post discharge 2. Please obtain BMP to evaluate electrolytes and kidney function 3. Please also check CBC to evaluate Hg and Hct levels 4. Please note that weight on discharge was 291 lbs 5. Pt discharged on Torsemide 40 mg BID and will need to follow up with his cardiologist   Discharge Diagnoses: Acute on chronic diastolic CHF  Principal Problem:  Acute on chronic diastolic CHF  Active Problems:  HYPERTENSION  GASTROESOPHAGEAL REFLUX DISEASE, CHRONIC  Thrombocytopenia  Hyperlipidemia  OSA (obstructive sleep apnea)  Morbid obesity- BMI 44.5  Edema of both legs  Right-sided heart failure  Acute on chronic diastolic heart failure   Discharge Condition: Stable  Diet recommendation: Heart healthy diet discussed in details  Brief narrative:  73 y.o. male with a past medical history of right-sided congestive heart failure, prostatitis, obesity, lower extremity edema presented to the emergency department with main concern of several days duration of progressively worsening dyspnea at rest as well as with exertion, lower extremity edema, more than 15 pounds weight gain. He has denied chest pain, abdominal or urinary concerns. In emergency department, patient noted to be volume overloaded with +2 lower extremity pitting edema, bibasilar crackles, exam findings consistent with pulmonary vascular congestion. Patient was given 80 mg Lasix IV and hospitalist team asked to admit for further evaluation and management of CHF.   Assessment/Plan:  Principal Problem:  Acute on chronic diastolic CHF  - Patient is clinically improving, weight is trending down : 305 lbs on admission --> 295 --> 291 pounds this morning  - Maintaining good  urine output, creatinine remains stable and within normal limits  - Appreciate cardiology input, transition to Torsemide  Active Problems:  HYPERTENSION: Reasonable inpatient control, continue metoprolol  GASTROESOPHAGEAL REFLUX DISEASE, CHRONIC: Stable, at baseline  Thrombocytopenia: Chronic. Chart review indicates his current level is close to baseline  Hyperlipidemia: Continue statin  Morbid obesity- BMI 44.5: Nutritional consult provided and pt appreciate input  OSA: unable to tolerate CPAP in past.   Code Status: full  Family Communication: pt at bedside   Consultants:  Cardiology  Procedures:  None    Discharge Exam: Filed Vitals:   08/24/13 0500  BP: 111/60  Pulse: 91  Temp: 97.4 F (36.3 C)  Resp: 19   Filed Vitals:   08/23/13 0533 08/23/13 1412 08/23/13 2300 08/24/13 0500  BP: 134/81 117/68 130/85 111/60  Pulse: 88 84 83 91  Temp: 97.5 F (36.4 C) 97.6 F (36.4 C) 97 F (36.1 C) 97.4 F (36.3 C)  TempSrc: Oral Oral Oral Oral  Resp: 16 18 17 19   Height:      Weight: 134 kg (295 lb 6.7 oz)   132.3 kg (291 lb 10.7 oz)  SpO2: 92% 92% 93% 92%    General: Pt is alert, follows commands appropriately, not in acute distress Cardiovascular: Regular rate and rhythm, S1/S2 +, no murmurs, no rubs, no gallops Respiratory: Clear to auscultation bilaterally, no wheezing, no crackles, no rhonchi Abdominal: Soft, non tender, non distended, bowel sounds +, no guarding  Discharge Instructions   Future Appointments Provider Department Dept Phone   08/27/2013 11:45 AM Pixie Casino, MD [facility] [phone]   11/12/2013 10:30 AM Philmore Pali, NP [facility] [phone]   07/17/2014  10:20 AM [place] [phone]   07/17/2014 10:30 AM Baird Cancer, PA-C [place] [phone]       Medication List    STOP taking these medications       furosemide 80 MG tablet  Commonly known as:  LASIX       TAKE these medications       acetaminophen 325 MG tablet  Commonly known as:  TYLENOL  Take 325-650 mg by mouth every 6 (six) hours as needed for pain.     atorvastatin 80 MG tablet  Commonly known as:  LIPITOR  Take 80 mg by mouth at bedtime.     cetirizine 10 MG tablet  Commonly known as:  ZYRTEC  Take 10 mg by mouth daily.     cyclobenzaprine 10 MG tablet  Commonly known as:  FLEXERIL  Take 1 tablet (10 mg total) by mouth 3 (three) times daily as needed for muscle spasms.     docusate sodium 100 MG capsule  Commonly known as:  COLACE  Take 100 mg by mouth daily as needed for mild constipation.     Dorzolamide HCl-Timolol Mal PF 22.3-6.8 MG/ML Soln  Apply 1 drop to eye 2 (two) times daily. Dorzolamide HCI-Timolol Maleate Ophthalmic Solution 2.23%/0.68%     latanoprost 0.005 % ophthalmic solution  Commonly known as:  XALATAN  Place 1 drop into both eyes at bedtime.     LUBRICANT EYE DROPS 0.4-0.3 % Soln  Generic drug:  Polyethyl Glycol-Propyl Glycol  Apply 1 drop to eye daily as needed (for lubricant eye drops).     metoprolol succinate 25 MG 24 hr tablet  Commonly known as:  TOPROL-XL  Take 1 tablet (25 mg total) by mouth every morning.     oxyCODONE-acetaminophen 10-325 MG per tablet  Commonly known as:  PERCOCET  Take 1 tablet by mouth every 6 (six) hours as needed for pain.     pantoprazole 40 MG tablet  Commonly known as:  PROTONIX  Take 40 mg by mouth 2 (two) times daily.     potassium chloride SA 20 MEQ tablet  Commonly known as:  K-DUR,KLOR-CON  Take 1 tablet (20 mEq total) by mouth 2 (two) times daily.     tamsulosin 0.4 MG Caps capsule  Commonly known as:  FLOMAX  Take 0.4 mg by mouth daily after supper.     torsemide 20 MG tablet  Commonly known as:  DEMADEX  Take 2 tablets (40 mg total) by mouth 2 (two) times daily.     verapamil 120 MG CR tablet  Commonly known as:  CALAN-SR  Take 1 tablet (120 mg total) by mouth at bedtime.            Follow-up Information   Follow up with [facility] [place].   Contact information:   [place] [address] [phone]       Schedule an appointment as soon as possible for a visit with [name]., MD.   Specialty:  Internal Medicine   Contact information:   [address] [place] [address] [phone]       Follow up with Pixie Casino, MD.   Specialty:  Cardiology   Contact information:   [place] [address] [phone]       Follow up with Faye Ramsay, MD. (As needed, If symptoms worsen, call my cell phone [phone])    Specialty:  Internal Medicine   Contact information:   [address] [place] [address] [phone]        The results of significant diagnostics from this hospitalization (including imaging, microbiology, ancillary and laboratory) are listed below for reference.     Microbiology: No results found for this or any previous visit (from the past 240 hour(s)).   Labs: Basic Metabolic Panel:  Recent Labs Lab 08/20/13 1115 08/21/13 0446 08/22/13 0503 08/23/13 0444 08/24/13 0628  NA 142 142 141 140 141  K 3.6* 4.1 4.0 4.1 4.0  CL 100 100 99 99 95*  CO2 34* 35* 35* 33* 36*  GLUCOSE 120* 148* 127* 121* 131*  BUN 16 14 12 14 14   CREATININE 0.92 0.99 0.87 0.88 1.13  CALCIUM 9.4 9.3 9.3 9.6 9.1  MG 2.2  --   --   --   --    Liver Function Tests:  Recent Labs Lab 08/20/13 1115  AST 21  ALT 19  ALKPHOS 101  BILITOT 0.8  PROT 6.8  ALBUMIN 3.2*   No results found for this basename: LIPASE, AMYLASE,  in the last 168 hours No results found for this basename: AMMONIA,  in the last 168 hours CBC:  Recent Labs Lab 08/20/13 1115 08/21/13 0446 08/22/13 0503  WBC 10.3 10.4 8.7  HGB 13.6 12.9* 13.4  HCT 41.7 40.1 41.7  MCV 79.4 80.2 79.9  PLT 125* 120* 115*   Cardiac Enzymes:  Recent Labs Lab 08/20/13 1115   TROPONINI <0.30   BNP: BNP (last 3 results)  Recent Labs  05/25/13 1239 08/20/13 1115 08/22/13 0503  PROBNP 26.1 10.0 11.3   CBG: No results found for this basename: GLUCAP,  in the last 168 hours   SIGNED: Time coordinating discharge: Over 30 minutes  Faye Ramsay, MD  [facility] 08/24/2013, 10:23 AM Pager [phone]  If 7PM-7AM, please contact night-coverage [email or web address] Password [email or web address]

## 2013-08-26 ENCOUNTER — Telehealth: Payer: Self-pay | Admitting: Internal Medicine

## 2013-08-26 DIAGNOSIS — G4733 Obstructive sleep apnea (adult) (pediatric): Secondary | ICD-10-CM | POA: Diagnosis not present

## 2013-08-26 DIAGNOSIS — L02419 Cutaneous abscess of limb, unspecified: Secondary | ICD-10-CM | POA: Diagnosis not present

## 2013-08-26 DIAGNOSIS — I2789 Other specified pulmonary heart diseases: Secondary | ICD-10-CM | POA: Diagnosis not present

## 2013-08-26 DIAGNOSIS — I509 Heart failure, unspecified: Secondary | ICD-10-CM | POA: Diagnosis not present

## 2013-08-26 DIAGNOSIS — L97209 Non-pressure chronic ulcer of unspecified calf with unspecified severity: Secondary | ICD-10-CM | POA: Diagnosis not present

## 2013-08-26 DIAGNOSIS — I872 Venous insufficiency (chronic) (peripheral): Secondary | ICD-10-CM | POA: Diagnosis not present

## 2013-08-26 NOTE — Telephone Encounter (Signed)
Verdis Frederickson, [facility] nurse w/ [facility], called.  Stated pt has been taking Cozaar and she doesn't have it on his list.  RN reviewed med list and Cozaar not listed either and also not listed for last two visit back to January 2015.  Advised pt should NOT be taking it.  Verdis Frederickson also stated pt has had a 3 lb weight gain.  Home weight yesterday after discharge was 292 lbs.  Stated pt weighed at 295 lbs this morning when he got up and is now 298 lbs.  Verdis Frederickson stated pt is in an older home and the floors are not leveled, which may contribute to the weights being off.  Stated pt has been taking 40 mg torsemide twice daily and lungs are clear.  Stated pt looks good and said he feels good.  Pt's discharge weight was 291 lbs.  Verdis Frederickson informed provider will be notified and RN will call back if further instructions given.  Advised pt keep his appt tomorrow w/ Dr. Debara Pickett for evaluation.  Message forwarded to DOD (Dr. Ellyn Hack) to review and advise, as primary cardiologist is out of the office.

## 2013-08-26 NOTE — Telephone Encounter (Signed)
Thanks .Marland Kitchen Still before 12.  I'll see him tomorrow.  -Dr. Debara Pickett

## 2013-08-26 NOTE — Telephone Encounter (Signed)
Call to [facility] and informed.  She will notify pt.

## 2013-08-27 ENCOUNTER — Encounter: Payer: Self-pay | Admitting: Internal Medicine

## 2013-08-27 ENCOUNTER — Ambulatory Visit (INDEPENDENT_AMBULATORY_CARE_PROVIDER_SITE_OTHER): Payer: Medicare Other | Admitting: Internal Medicine

## 2013-08-27 ENCOUNTER — Telehealth: Payer: Self-pay | Admitting: *Deleted

## 2013-08-27 VITALS — BP 122/98 | HR 92 | Ht 69.0 in | Wt 300.6 lb

## 2013-08-27 DIAGNOSIS — R0602 Shortness of breath: Secondary | ICD-10-CM

## 2013-08-27 DIAGNOSIS — M47817 Spondylosis without myelopathy or radiculopathy, lumbosacral region: Secondary | ICD-10-CM | POA: Diagnosis not present

## 2013-08-27 DIAGNOSIS — Z6841 Body Mass Index (BMI) 40.0 and over, adult: Secondary | ICD-10-CM | POA: Diagnosis not present

## 2013-08-27 DIAGNOSIS — I2510 Atherosclerotic heart disease of native coronary artery without angina pectoris: Secondary | ICD-10-CM | POA: Diagnosis not present

## 2013-08-27 DIAGNOSIS — I5033 Acute on chronic diastolic (congestive) heart failure: Secondary | ICD-10-CM

## 2013-08-27 DIAGNOSIS — I10 Essential (primary) hypertension: Secondary | ICD-10-CM

## 2013-08-27 DIAGNOSIS — I50810 Right heart failure, unspecified: Secondary | ICD-10-CM

## 2013-08-27 DIAGNOSIS — Z79899 Other long term (current) drug therapy: Secondary | ICD-10-CM

## 2013-08-27 DIAGNOSIS — E785 Hyperlipidemia, unspecified: Secondary | ICD-10-CM

## 2013-08-27 DIAGNOSIS — G4733 Obstructive sleep apnea (adult) (pediatric): Secondary | ICD-10-CM

## 2013-08-27 DIAGNOSIS — I509 Heart failure, unspecified: Secondary | ICD-10-CM

## 2013-08-27 MED ORDER — METOLAZONE 2.5 MG PO TABS: ORAL_TABLET | ORAL | Status: DC

## 2013-08-27 NOTE — Telephone Encounter (Signed)
Returned call to [facility] w/ [facility].  Stated pt has not taken verapamil since December.  Said it was rx'd by pt's neurologist for migraines and pt doesn't remember taking it.  Informed pt's meds will be reviewed at his appt today and Dr. Lysbeth Penner nurse will be notified.  Message forwarded to [place], Therapist, sports.

## 2013-08-27 NOTE — Progress Notes (Signed)
OFFICE NOTE  Chief Complaint:  Routine follow-up  Primary Care Physician: [name]., MD  HPI:  [name] [name] is a 73 year old gentleman with OSA, morbid obesity, probable right heart failure and long-standing lower extremity edema. He had a venous insufficiency study, which showed deep vein insufficiency, but no superficial reflux. We have discussed stocking use; however, he is unable to get them on. Therefore, we are managing with diuretics, currently on Lasix 80 mg twice daily. He has had some lower extremity swelling. However, overall his weight has decreased markedly, down to 282 from 301. This appears to be stable today. He recently had low back surgery and reports a marked improvement in his back pain.  During rehabilitation, however he reported an increase in right knee pain with some crepitus. He is scheduled to see Dr. Ace Gins for possible pain management (?injections). He continues to have mostly symptoms due to right heart failure, but they are only with marked exertion.   He was recently hospitalized at any and 4 acute on chronic diastolic heart failure. He had more than 10 pound weight gain. He was diureses with IV Lasix and switched to by mouth torsemide. He was on Lasix 80 mg 3 times a day prior to admission but was discharged on 40 mg of torsemide twice daily. This actually represents a decreased dose of diuretic overall. Since discharge, his weight has been steadily climbing and is now up about 7 pounds over discharge weight. He is reporting more abdominal swelling and lower extremity edema. He is more short of breath and he is somewhat orthopneic  PMHx:  Past Medical History  Diagnosis Date  . CHF (congestive heart failure)   . Obesity   . Hyperlipidemia   . PUD (peptic ulcer disease)   . GERD (gastroesophageal reflux disease)   . S/P endoscopy 2007, 2012    2007: nl, May 2012: antral erosions  . Osteoarthritis   . S/P colonoscopy 2007, Feb and March 2012    hx  of adenomas, left-sided diverticula, On 07/2010 TCS, fresh blood and clot noted coming from TI.  Marland Kitchen Anxiety   . Hypothyroidism     hypothyroid  . Ringing in ears   . History of kidney stones   . History of bladder infections   . Heel spur     right heel  . Collagen vascular disease     history of venous insufficency and LE cellulitis  . Thrombocytopenia 12/30/2011    Stable  . BPH (benign prostatic hyperplasia)   . Hypertension     Sees Dr. Debara Pickett  . Bilateral lower leg cellulitis   . Sleep apnea     uses oxygen 2L via [place] cannot tolerate CPAP - sleep study 2008 AHI 48.80/hr and 56.70hr during REM  . Complication of anesthesia     due to sleep apnea pt has had difficulty being put under anesthesia  . PONV (postoperative nausea and vomiting)   . Deep venous insufficiency   . Chronic diastolic HF (heart failure)     Past Surgical History  Procedure Laterality Date  . Knee arthroscopy    . Rotator cuff repair    . Cholecystectomy    . Lumbar spine surgery    . Small bowel capsule study  07/2011    ?transient focal ischemia in distal small bowel to explain GI bleeding  . Esophagogastroduodenoscopy  Aug 2013    [facility], single 25mm pedunculated polyp otherwise normal. HYPERPLASTIC, NEGATIVE h.pylori  . Cardiac catheterization  02/27/2007  no sign of CAD, LVH, mod pulm HTN (Dr. Jackie Plum(  . Transurethral resection of prostate    . Transurethral resection of prostate N/A 06/18/2013    Procedure: TRANSURETHRAL RESECTION OF THE PROSTATE (TURP);  Surgeon: Marissa Nestle, MD;  Location: [facility];  Service: Urology;  Laterality: N/A;  . Transthoracic echocardiogram  09/2008    EF 60-65%, mod conc LVH  . Nm myocar perf wall motion  12/2006    dipyridamole myoview - stress images show mild perfusion defect in mid inferior walls with reversibility at rest; mild perfusion defect in mid inferolateral wall at stress with mild defect reversibility, EF 62%, abnormal but low risk study     FAMHx:    Family History  Problem Relation Age of Onset  . Colon cancer Father 73    deceased  . Prostate cancer Father   . Pancreatic cancer Mother 49    deceased    SOCHx:   reports that he quit smoking about 40 years ago. His smoking use included Cigars. He quit smokeless tobacco use about 40 years ago. He reports that he does not drink alcohol or use illicit drugs.  ALLERGIES:  Allergies  Allergen Reactions  . Aspirin Other (See Comments)    Nausea and upset stomach      ROS: A comprehensive review of systems was negative except for: Respiratory: positive for dyspnea on exertion Cardiovascular: positive for lower extremity edema and orthopnea Musculoskeletal: positive for back pain  HOME MEDS: Current Outpatient Prescriptions  Medication Sig Dispense Refill  . acetaminophen (TYLENOL) 325 MG tablet Take 325-650 mg by mouth every 6 (six) hours as needed for pain.       Marland Kitchen atorvastatin (LIPITOR) 80 MG tablet Take 80 mg by mouth at bedtime.      . cetirizine (ZYRTEC) 10 MG tablet Take 10 mg by mouth daily.      . cyclobenzaprine (FLEXERIL) 10 MG tablet Take 1 tablet (10 mg total) by mouth 3 (three) times daily as needed for muscle spasms.  45 tablet  0  . docusate sodium (COLACE) 100 MG capsule Take 100 mg by mouth daily as needed for mild constipation.       . Dorzolamide HCl-Timolol Mal PF 22.3-6.8 MG/ML SOLN Apply 1 drop to eye 2 (two) times daily. Dorzolamide HCI-Timolol Maleate Ophthalmic Solution 2.23%/0.68%      . latanoprost (XALATAN) 0.005 % ophthalmic solution Place 1 drop into both eyes at bedtime.      . metoprolol succinate (TOPROL-XL) 25 MG 24 hr tablet Take 1 tablet (25 mg total) by mouth every morning.  30 tablet  11  . oxyCODONE-acetaminophen (PERCOCET) 10-325 MG per tablet Take 1 tablet by mouth every 6 (six) hours as needed for pain.  90 tablet  0  . pantoprazole (PROTONIX) 40 MG tablet Take 40 mg by mouth 2 (two) times daily.      Vladimir Faster Glycol-Propyl Glycol  (LUBRICANT EYE DROPS) 0.4-0.3 % SOLN Apply 1 drop to eye daily as needed (for lubricant eye drops).      . potassium chloride SA (K-DUR,KLOR-CON) 20 MEQ tablet Take 1 tablet (20 mEq total) by mouth 2 (two) times daily.  60 tablet  0  . Tamsulosin HCl (FLOMAX) 0.4 MG CAPS Take 0.4 mg by mouth daily after supper.       . torsemide (DEMADEX) 20 MG tablet Take 60 mg by mouth 2 (two) times daily.      . metolazone (ZAROXOLYN) 2.5 MG tablet Take 1  tablet by mouth once daily 30 minutes prior to AM dose of torsemide.  90 tablet  3   No current facility-administered medications for this visit.    LABS/IMAGING: No results found for this or any previous visit (from the past 48 hour(s)). No results found.  VITALS: BP 122/98  Pulse 92  Ht 5\' 9"  (1.753 m)  Wt 300 lb 9.6 oz (136.351 kg)  BMI 44.37 kg/m2  EXAM: General appearance: alert, no distress and morbidly obese Neck: no adenopathy, no carotid bruit, no JVD, supple, symmetrical, trachea midline and thyroid not enlarged, symmetric, no tenderness/mass/nodules Lungs: clear to auscultation bilaterally Heart: regular rate and rhythm, S1, S2 normal, no murmur, click, rub or gallop Abdomen: morbidly obese, distended Extremities: edema 2 Pulses: 2+ and symmetric Skin: Skin color, texture, turgor normal. No rashes or lesions Neurologic: Grossly normal  EKG: Normal sinus rhythm at 92  ASSESSMENT: 1. Acute on chronic diastolic congestive heart failure 2. Morbid obesity 3. Obstructive sleep apnea intolerant to CPAP, on home oxygen at night 4. Symptoms of right heart failure 5. Low back pain status post recent back surgery  PLAN: 1.   Mr. Danelle Earthly he is continuing to gain weight after his discharge from the hospital. Review of his diuretic regimen shows that he is on Lasix equivalent diuresis to what he was on prior to admission (Lasix to torsemide conversion is 2:1). He reports his urination is more frequent and significant with Lasix. I'm not  sure that it is torsemide to blame, but necessarily a lower dose of medication. However recommend increasing his torsemide to 60 mg twice daily and will add metolazone 2.5 mg daily in the morning prior to his first dose.  Hopefully this will increase his diuresis. We will need to recheck a metabolic profile and BNP this week. Plan to schedule followup with a mid-level provider next week and if his weight is not significantly improved, he may need admission for IV diuretics.  We did discuss with his decreasing vision and other medical problems about him possibly transferring his care to refill to see Dr. Harl Bowie.  He had a positive experience with his recent consultation. While I enjoyed seeing Mr. Cordrey, I feel that he may do better off with the ability to have more frequent visits in [place].   Pixie Casino, MD, [facility] Attending Cardiologist [facility] 08/27/2013, 7:54 PM

## 2013-08-27 NOTE — Telephone Encounter (Signed)
Verdis Frederickson was calling in regards to Verapamil. He is not taking this medication.  [place]

## 2013-08-27 NOTE — Patient Instructions (Addendum)
Your physician recommends that you schedule a follow-up appointment in: 1 week.  Future office visits to be scheduled with Dr. Harl Bowie in [place]  Please have lab work on Friday  START metolazone 2.5mg  once daily - 30 minutes prior to AM dose of torsemide.

## 2013-08-28 DIAGNOSIS — M1710 Unilateral primary osteoarthritis, unspecified knee: Secondary | ICD-10-CM | POA: Diagnosis not present

## 2013-08-28 DIAGNOSIS — IMO0001 Reserved for inherently not codable concepts without codable children: Secondary | ICD-10-CM | POA: Diagnosis not present

## 2013-08-28 DIAGNOSIS — IMO0002 Reserved for concepts with insufficient information to code with codable children: Secondary | ICD-10-CM | POA: Diagnosis not present

## 2013-08-28 DIAGNOSIS — G894 Chronic pain syndrome: Secondary | ICD-10-CM | POA: Diagnosis not present

## 2013-08-28 DIAGNOSIS — M25569 Pain in unspecified knee: Secondary | ICD-10-CM | POA: Diagnosis not present

## 2013-08-28 DIAGNOSIS — M47817 Spondylosis without myelopathy or radiculopathy, lumbosacral region: Secondary | ICD-10-CM | POA: Diagnosis not present

## 2013-08-28 DIAGNOSIS — M545 Low back pain, unspecified: Secondary | ICD-10-CM | POA: Diagnosis not present

## 2013-08-28 NOTE — Discharge Summary (Deleted)
Physician Discharge Summary  [name] [name] HCW:[id number] DOB: 04-03-1941 DOA: 08/20/2013  PCP: [name]., MD  Admit date: 08/20/2013 Discharge date: 08/28/2013  Recommendations for Outpatient Follow-up:  1. Pt will need to follow up with PCP in 2-3 weeks post discharge 2. Please obtain BMP to evaluate electrolytes and kidney function 3. Please also check CBC to evaluate Hg and Hct levels 4. Please note that weight on discharge was 291 lbs 5. Pt discharged on Torsemide 40 mg BID and will need to follow up with his cardiologist   Discharge Diagnoses:   Acute on chronic diastolic CHF Principal Problem:   Acute on chronic diastolic CHF Active Problems:   HYPERTENSION   GASTROESOPHAGEAL REFLUX DISEASE, CHRONIC   Thrombocytopenia   Hyperlipidemia   OSA (obstructive sleep apnea)   Morbid obesity- BMI 44.5   Edema of both legs   Right-sided heart failure   Acute on chronic diastolic heart failure    Discharge Condition: Stable  Diet recommendation: Heart healthy diet discussed in details   Brief narrative:  73 y.o. male with a past medical history of right-sided congestive heart failure, prostatitis, obesity, lower extremity edema presented to the emergency department with main concern of several days duration of progressively worsening dyspnea at rest as well as with exertion, lower extremity edema, more than 15 pounds weight gain. He has denied chest pain, abdominal or urinary concerns. In emergency department, patient noted to be volume overloaded with +2 lower extremity pitting edema, bibasilar crackles, exam findings consistent with pulmonary vascular congestion. Patient was given 80 mg Lasix IV and hospitalist team asked to admit for further evaluation and management of CHF.   Assessment/Plan:  Principal Problem:  Acute on chronic diastolic CHF  - Patient is clinically improving, weight is trending down : 305 lbs on admission --> 295 --> 291 pounds this morning  -  Maintaining good urine output, creatinine remains stable and within normal limits  - Appreciate cardiology input, transition to Torsemide  Active Problems:  HYPERTENSION: Reasonable inpatient control, continue metoprolol  GASTROESOPHAGEAL REFLUX DISEASE, CHRONIC: Stable, at baseline  Thrombocytopenia: Chronic. Chart review indicates his current level is close to baseline  Hyperlipidemia: Continue statin  Morbid obesity- BMI 44.5: Nutritional consult provided and pt appreciate input  OSA: unable to tolerate CPAP in past.  Code Status: full  Family Communication: pt at bedside   Consultants:  Cardiology  Procedures:  None  Antibiotics:  None   Discharge Exam: Filed Vitals:   08/24/13 1500  BP: 118/79  Pulse: 84  Temp: 96.9 F (36.1 C)  Resp: 20   Filed Vitals:   08/23/13 1412 08/23/13 2300 08/24/13 0500 08/24/13 1500  BP: 117/68 130/85 111/60 118/79  Pulse: 84 83 91 84  Temp: 97.6 F (36.4 C) 97 F (36.1 C) 97.4 F (36.3 C) 96.9 F (36.1 C)  TempSrc: Oral Oral Oral Oral  Resp: 18 17 19 20   Height:      Weight:   291 lb 10.7 oz (132.3 kg)   SpO2: 92% 93% 92% 96%    General: Pt is alert, follows commands appropriately, not in acute distress Cardiovascular: Regular rate and rhythm, S1/S2 +, no murmurs, no rubs, no gallops Respiratory: Clear to auscultation bilaterally, no wheezing, no crackles, no rhonchi Abdominal: Soft, non tender, non distended, bowel sounds +, no guarding Extremities: +1 bilateral LE pitting edema, no cyanosis, pulses palpable bilaterally DP and PT, chronic venous stasis changes  Neuro: Grossly nonfocal  Discharge Instructions      Discharge  Orders   Future Appointments Provider Department Dept Phone   09/06/2013 9:00 AM Pixie Casino, MD [facility] [phone]   11/12/2013 10:30 AM Philmore Pali, NP [facility] [phone]   07/17/2014 10:20 AM [place] [phone]   07/17/2014  10:30 AM Baird Cancer, PA-C [facility] [phone]   Future Orders Complete By Expires   Diet - low sodium heart healthy  As directed    Increase activity slowly  As directed        Medication List    STOP taking these medications       furosemide 80 MG tablet  Commonly known as:  LASIX      TAKE these medications       acetaminophen 325 MG tablet  Commonly known as:  TYLENOL  Take 325-650 mg by mouth every 6 (six) hours as needed for pain.     atorvastatin 80 MG tablet  Commonly known as:  LIPITOR  Take 80 mg by mouth at bedtime.     cetirizine 10 MG tablet  Commonly known as:  ZYRTEC  Take 10 mg by mouth daily.     cyclobenzaprine 10 MG tablet  Commonly known as:  FLEXERIL  Take 1 tablet (10 mg total) by mouth 3 (three) times daily as needed for muscle spasms.     docusate sodium 100 MG capsule  Commonly known as:  COLACE  Take 100 mg by mouth daily as needed for mild constipation.     Dorzolamide HCl-Timolol Mal PF 22.3-6.8 MG/ML Soln  Apply 1 drop to eye 2 (two) times daily. Dorzolamide HCI-Timolol Maleate Ophthalmic Solution 2.23%/0.68%     latanoprost 0.005 % ophthalmic solution  Commonly known as:  XALATAN  Place 1 drop into both eyes at bedtime.     LUBRICANT EYE DROPS 0.4-0.3 % Soln  Generic drug:  Polyethyl Glycol-Propyl Glycol  Apply 1 drop to eye daily as needed (for lubricant eye drops).     metoprolol succinate 25 MG 24 hr tablet  Commonly known as:  TOPROL-XL  Take 1 tablet (25 mg total) by mouth every morning.     oxyCODONE-acetaminophen 10-325 MG per tablet  Commonly known as:  PERCOCET  Take 1 tablet by mouth every 6 (six) hours as needed for pain.     pantoprazole 40 MG tablet  Commonly known as:  PROTONIX  Take 40 mg by mouth 2 (two) times daily.     potassium chloride SA 20 MEQ tablet  Commonly known as:  K-DUR,KLOR-CON  Take 1 tablet (20 mEq total) by mouth 2 (two) times daily.     tamsulosin 0.4 MG Caps capsule   Commonly known as:  FLOMAX  Take 0.4 mg by mouth daily after supper.       Follow-up Information   Follow up with [facility].   Contact information:   [place] [address] [phone]       Schedule an appointment as soon as possible for a visit with [name]., MD.   Specialty:  Internal Medicine   Contact information:   [address] [place] [address] [phone]       Follow up with Pixie Casino, MD.   Specialty:  Cardiology   Contact information:   [address] [place] [address] [phone]       Follow up with Faye Ramsay, MD. (As needed, If symptoms worsen, call  my cell phone [phone])    Specialty:  Internal Medicine   Contact information:   [address] [place] [address] [phone]        The results of significant diagnostics from this hospitalization (including imaging, microbiology, ancillary and laboratory) are listed below for reference.     Microbiology: No results found for this or any previous visit (from the past 240 hour(s)).   Labs: Basic Metabolic Panel:  Recent Labs Lab 08/22/13 0503 08/23/13 0444 08/24/13 0628  NA 141 140 141  K 4.0 4.1 4.0  CL 99 99 95*  CO2 35* 33* 36*  GLUCOSE 127* 121* 131*  BUN 12 14 14   CREATININE 0.87 0.88 1.13  CALCIUM 9.3 9.6 9.1   Liver Function Tests: No results found for this basename: AST, ALT, ALKPHOS, BILITOT, PROT, ALBUMIN,  in the last 168 hours No results found for this basename: LIPASE, AMYLASE,  in the last 168 hours No results found for this basename: AMMONIA,  in the last 168 hours CBC:  Recent Labs Lab 08/22/13 0503  WBC 8.7  HGB 13.4  HCT 41.7  MCV 79.9  PLT 115*   Cardiac Enzymes: No results found for this basename: CKTOTAL, CKMB, CKMBINDEX, TROPONINI,  in the last 168 hours BNP: BNP (last 3 results)  Recent Labs  05/25/13 1239 08/20/13 1115  08/22/13 0503  PROBNP 26.1 10.0 11.3   CBG: No results found for this basename: GLUCAP,  in the last 168 hours   SIGNED: Time coordinating discharge: Over 30 minutes  Theodis Blaze, MD  [facility] 08/28/2013, 12:55 PM Pager [phone]  If 7PM-7AM, please contact night-coverage [email or web address] Password [email or web address]

## 2013-08-29 DIAGNOSIS — I509 Heart failure, unspecified: Secondary | ICD-10-CM | POA: Diagnosis not present

## 2013-08-29 DIAGNOSIS — L03119 Cellulitis of unspecified part of limb: Secondary | ICD-10-CM | POA: Diagnosis not present

## 2013-08-29 DIAGNOSIS — I2789 Other specified pulmonary heart diseases: Secondary | ICD-10-CM | POA: Diagnosis not present

## 2013-08-29 DIAGNOSIS — L02419 Cutaneous abscess of limb, unspecified: Secondary | ICD-10-CM | POA: Diagnosis not present

## 2013-08-29 DIAGNOSIS — Z5181 Encounter for therapeutic drug level monitoring: Secondary | ICD-10-CM | POA: Diagnosis not present

## 2013-08-29 DIAGNOSIS — Z79899 Other long term (current) drug therapy: Secondary | ICD-10-CM | POA: Diagnosis not present

## 2013-08-29 DIAGNOSIS — L97209 Non-pressure chronic ulcer of unspecified calf with unspecified severity: Secondary | ICD-10-CM | POA: Diagnosis not present

## 2013-08-29 DIAGNOSIS — G4733 Obstructive sleep apnea (adult) (pediatric): Secondary | ICD-10-CM | POA: Diagnosis not present

## 2013-08-29 DIAGNOSIS — I872 Venous insufficiency (chronic) (peripheral): Secondary | ICD-10-CM | POA: Diagnosis not present

## 2013-08-30 DIAGNOSIS — Z79899 Other long term (current) drug therapy: Secondary | ICD-10-CM | POA: Diagnosis not present

## 2013-08-30 DIAGNOSIS — R0602 Shortness of breath: Secondary | ICD-10-CM | POA: Diagnosis not present

## 2013-08-30 LAB — BASIC METABOLIC PANEL
BUN: 35 mg/dL — ABNORMAL HIGH (ref 6–23)
CALCIUM: 10.2 mg/dL (ref 8.4–10.5)
CO2: 39 mEq/L — ABNORMAL HIGH (ref 19–32)
Chloride: 84 mEq/L — ABNORMAL LOW (ref 96–112)
Creat: 1.39 mg/dL — ABNORMAL HIGH (ref 0.50–1.35)
GLUCOSE: 160 mg/dL — AB (ref 70–99)
Potassium: 2.8 mEq/L — ABNORMAL LOW (ref 3.5–5.3)
Sodium: 136 mEq/L (ref 135–145)

## 2013-08-31 LAB — BRAIN NATRIURETIC PEPTIDE: BRAIN NATRIURETIC PEPTIDE: 4.1 pg/mL (ref 0.0–100.0)

## 2013-09-01 ENCOUNTER — Inpatient Hospital Stay (HOSPITAL_COMMUNITY)
Admission: EM | Admit: 2013-09-01 | Discharge: 2013-09-03 | DRG: 641 | Disposition: A | Discharge status: Home Health | Payer: Medicare Other | Attending: Internal Medicine | Admitting: Internal Medicine

## 2013-09-01 ENCOUNTER — Encounter (HOSPITAL_COMMUNITY): Payer: Self-pay | Admitting: Emergency Medicine

## 2013-09-01 ENCOUNTER — Emergency Department (HOSPITAL_COMMUNITY): Payer: Medicare Other

## 2013-09-01 DIAGNOSIS — M1990 Unspecified osteoarthritis, unspecified site: Secondary | ICD-10-CM | POA: Diagnosis present

## 2013-09-01 DIAGNOSIS — N19 Unspecified kidney failure: Secondary | ICD-10-CM | POA: Diagnosis not present

## 2013-09-01 DIAGNOSIS — Z87891 Personal history of nicotine dependence: Secondary | ICD-10-CM

## 2013-09-01 DIAGNOSIS — E876 Hypokalemia: Principal | ICD-10-CM | POA: Diagnosis present

## 2013-09-01 DIAGNOSIS — E039 Hypothyroidism, unspecified: Secondary | ICD-10-CM | POA: Diagnosis present

## 2013-09-01 DIAGNOSIS — I5032 Chronic diastolic (congestive) heart failure: Secondary | ICD-10-CM | POA: Diagnosis not present

## 2013-09-01 DIAGNOSIS — R079 Chest pain, unspecified: Secondary | ICD-10-CM | POA: Diagnosis not present

## 2013-09-01 DIAGNOSIS — E860 Dehydration: Secondary | ICD-10-CM | POA: Diagnosis present

## 2013-09-01 DIAGNOSIS — Z8042 Family history of malignant neoplasm of prostate: Secondary | ICD-10-CM | POA: Diagnosis not present

## 2013-09-01 DIAGNOSIS — Z87440 Personal history of urinary (tract) infections: Secondary | ICD-10-CM | POA: Diagnosis not present

## 2013-09-01 DIAGNOSIS — G4733 Obstructive sleep apnea (adult) (pediatric): Secondary | ICD-10-CM | POA: Diagnosis present

## 2013-09-01 DIAGNOSIS — K219 Gastro-esophageal reflux disease without esophagitis: Secondary | ICD-10-CM | POA: Diagnosis present

## 2013-09-01 DIAGNOSIS — Z6841 Body Mass Index (BMI) 40.0 and over, adult: Secondary | ICD-10-CM | POA: Diagnosis not present

## 2013-09-01 DIAGNOSIS — R0789 Other chest pain: Secondary | ICD-10-CM | POA: Diagnosis present

## 2013-09-01 DIAGNOSIS — T502X5A Adverse effect of carbonic-anhydrase inhibitors, benzothiadiazides and other diuretics, initial encounter: Secondary | ICD-10-CM | POA: Diagnosis present

## 2013-09-01 DIAGNOSIS — E785 Hyperlipidemia, unspecified: Secondary | ICD-10-CM | POA: Diagnosis present

## 2013-09-01 DIAGNOSIS — I10 Essential (primary) hypertension: Secondary | ICD-10-CM | POA: Diagnosis present

## 2013-09-01 DIAGNOSIS — D72829 Elevated white blood cell count, unspecified: Secondary | ICD-10-CM | POA: Diagnosis not present

## 2013-09-01 DIAGNOSIS — I509 Heart failure, unspecified: Secondary | ICD-10-CM | POA: Diagnosis not present

## 2013-09-01 DIAGNOSIS — Z800 Family history of malignant neoplasm of digestive organs: Secondary | ICD-10-CM

## 2013-09-01 DIAGNOSIS — Z79899 Other long term (current) drug therapy: Secondary | ICD-10-CM

## 2013-09-01 DIAGNOSIS — Z87442 Personal history of urinary calculi: Secondary | ICD-10-CM | POA: Diagnosis not present

## 2013-09-01 DIAGNOSIS — I872 Venous insufficiency (chronic) (peripheral): Secondary | ICD-10-CM | POA: Diagnosis present

## 2013-09-01 DIAGNOSIS — I50810 Right heart failure, unspecified: Secondary | ICD-10-CM | POA: Diagnosis present

## 2013-09-01 LAB — CBC WITH DIFFERENTIAL/PLATELET
BASOS PCT: 0 % (ref 0–1)
Basophils Absolute: 0 10*3/uL (ref 0.0–0.1)
EOS ABS: 0 10*3/uL (ref 0.0–0.7)
EOS PCT: 0 % (ref 0–5)
HEMATOCRIT: 48.4 % (ref 39.0–52.0)
Hemoglobin: 16.7 g/dL (ref 13.0–17.0)
Lymphocytes Relative: 13 % (ref 12–46)
Lymphs Abs: 2.4 10*3/uL (ref 0.7–4.0)
MCH: 26.4 pg (ref 26.0–34.0)
MCHC: 34.5 g/dL (ref 30.0–36.0)
MCV: 76.6 fL — ABNORMAL LOW (ref 78.0–100.0)
MONOS PCT: 4 % (ref 3–12)
Monocytes Absolute: 0.7 10*3/uL (ref 0.1–1.0)
NEUTROS ABS: 15.6 10*3/uL — AB (ref 1.7–7.7)
NEUTROS PCT: 83 % — AB (ref 43–77)
Platelets: 152 10*3/uL (ref 150–400)
RBC: 6.32 MIL/uL — AB (ref 4.22–5.81)
RDW: 17.4 % — ABNORMAL HIGH (ref 11.5–15.5)
WBC: 18.7 10*3/uL — AB (ref 4.0–10.5)

## 2013-09-01 LAB — COMPREHENSIVE METABOLIC PANEL
ALK PHOS: 110 U/L (ref 39–117)
ALT: 21 U/L (ref 0–53)
AST: 22 U/L (ref 0–37)
Albumin: 3.5 g/dL (ref 3.5–5.2)
BILIRUBIN TOTAL: 1.7 mg/dL — AB (ref 0.3–1.2)
BUN: 44 mg/dL — AB (ref 6–23)
CHLORIDE: 81 meq/L — AB (ref 96–112)
CO2: 41 mEq/L (ref 19–32)
Calcium: 9.9 mg/dL (ref 8.4–10.5)
Creatinine, Ser: 1.4 mg/dL — ABNORMAL HIGH (ref 0.50–1.35)
GFR calc non Af Amer: 48 mL/min — ABNORMAL LOW (ref >90)
GFR, EST AFRICAN AMERICAN: 56 mL/min — AB (ref >90)
GLUCOSE: 170 mg/dL — AB (ref 70–99)
POTASSIUM: 2.4 meq/L — AB (ref 3.7–5.3)
SODIUM: 135 meq/L — AB (ref 137–147)
Total Protein: 7.5 g/dL (ref 6.0–8.3)

## 2013-09-01 LAB — URINALYSIS, ROUTINE W REFLEX MICROSCOPIC
Bilirubin Urine: NEGATIVE
GLUCOSE, UA: NEGATIVE mg/dL
Hgb urine dipstick: NEGATIVE
Ketones, ur: NEGATIVE mg/dL
Leukocytes, UA: NEGATIVE
Nitrite: NEGATIVE
PROTEIN: NEGATIVE mg/dL
Specific Gravity, Urine: 1.015 (ref 1.005–1.030)
Urobilinogen, UA: 0.2 mg/dL (ref 0.0–1.0)
pH: 5 (ref 5.0–8.0)

## 2013-09-01 LAB — MAGNESIUM: Magnesium: 2.5 mg/dL (ref 1.5–2.5)

## 2013-09-01 LAB — TROPONIN I
Troponin I: <0.3 ng/mL (ref <0.30)
Troponin I: <0.3 ng/mL (ref <0.30)

## 2013-09-01 LAB — PRO B NATRIURETIC PEPTIDE: Pro B Natriuretic peptide (BNP): 59 pg/mL (ref 0–125)

## 2013-09-01 MED ORDER — ONDANSETRON HCL 4 MG/2ML IJ SOLN
4.0000 mg | Freq: Four times a day (QID) | INTRAMUSCULAR | Status: DC | PRN
  Administered 2013-09-01: 4 mg via INTRAVENOUS
  Filled 2013-09-01: qty 2

## 2013-09-01 MED ORDER — TAMSULOSIN HCL 0.4 MG PO CAPS
0.4000 mg | ORAL_CAPSULE | Freq: Every day | ORAL | Status: DC
  Administered 2013-09-01 – 2013-09-02 (×2): 0.4 mg via ORAL
  Filled 2013-09-01 (×2): qty 1

## 2013-09-01 MED ORDER — POTASSIUM CHLORIDE 10 MEQ/100ML IV SOLN
10.0000 meq | Freq: Once | INTRAVENOUS | Status: AC
  Administered 2013-09-01: 10 meq via INTRAVENOUS
  Filled 2013-09-01: qty 100

## 2013-09-01 MED ORDER — ACETAMINOPHEN 650 MG RE SUPP: 650.0000 mg | Freq: Four times a day (QID) | RECTAL | Status: DC | PRN

## 2013-09-01 MED ORDER — CYCLOBENZAPRINE HCL 10 MG PO TABS: 10.0000 mg | ORAL_TABLET | Freq: Three times a day (TID) | ORAL | Status: DC | PRN

## 2013-09-01 MED ORDER — ENOXAPARIN SODIUM 40 MG/0.4ML ~~LOC~~ SOLN
40.0000 mg | SUBCUTANEOUS | Status: DC
  Administered 2013-09-01 – 2013-09-02 (×2): 40 mg via SUBCUTANEOUS
  Filled 2013-09-01 (×3): qty 0.4

## 2013-09-01 MED ORDER — LATANOPROST 0.005 % OP SOLN
1.0000 [drp] | Freq: Every day | OPHTHALMIC | Status: DC
  Administered 2013-09-01 – 2013-09-02 (×2): 1 [drp] via OPHTHALMIC
  Filled 2013-09-01: qty 2.5

## 2013-09-01 MED ORDER — TIMOLOL MALEATE 0.5 % OP SOLN
1.0000 [drp] | Freq: Two times a day (BID) | OPHTHALMIC | Status: DC
  Administered 2013-09-01 – 2013-09-03 (×5): 1 [drp] via OPHTHALMIC
  Filled 2013-09-01: qty 5

## 2013-09-01 MED ORDER — OXYCODONE-ACETAMINOPHEN 10-325 MG PO TABS: 1.0000 | ORAL_TABLET | Freq: Four times a day (QID) | ORAL | Status: DC | PRN

## 2013-09-01 MED ORDER — ATORVASTATIN CALCIUM 40 MG PO TABS
80.0000 mg | ORAL_TABLET | Freq: Every day | ORAL | Status: DC
  Administered 2013-09-01 – 2013-09-02 (×2): 80 mg via ORAL
  Filled 2013-09-01 (×2): qty 2

## 2013-09-01 MED ORDER — OXYCODONE HCL 5 MG PO TABS
5.0000 mg | ORAL_TABLET | Freq: Four times a day (QID) | ORAL | Status: DC | PRN
  Administered 2013-09-02 (×2): 5 mg via ORAL
  Filled 2013-09-01 (×2): qty 1

## 2013-09-01 MED ORDER — SODIUM CHLORIDE 0.9 % IJ SOLN
3.0000 mL | Freq: Two times a day (BID) | INTRAMUSCULAR | Status: DC
  Administered 2013-09-02 (×2): 3 mL via INTRAVENOUS

## 2013-09-01 MED ORDER — ONDANSETRON HCL 4 MG PO TABS
4.0000 mg | ORAL_TABLET | Freq: Four times a day (QID) | ORAL | Status: DC | PRN
  Administered 2013-09-02: 4 mg via ORAL
  Filled 2013-09-01: qty 1

## 2013-09-01 MED ORDER — POTASSIUM CHLORIDE IN NACL 40-0.9 MEQ/L-% IV SOLN
INTRAVENOUS | Status: DC
  Administered 2013-09-01 – 2013-09-02 (×3): via INTRAVENOUS

## 2013-09-01 MED ORDER — POTASSIUM CHLORIDE CRYS ER 20 MEQ PO TBCR
40.0000 meq | EXTENDED_RELEASE_TABLET | ORAL | Status: AC
  Administered 2013-09-01 (×3): 40 meq via ORAL
  Filled 2013-09-01 (×3): qty 2

## 2013-09-01 MED ORDER — ACETAMINOPHEN 325 MG PO TABS
650.0000 mg | ORAL_TABLET | Freq: Four times a day (QID) | ORAL | Status: DC | PRN
  Administered 2013-09-01: 650 mg via ORAL
  Filled 2013-09-01: qty 2

## 2013-09-01 MED ORDER — METOPROLOL SUCCINATE ER 25 MG PO TB24
25.0000 mg | ORAL_TABLET | Freq: Every morning | ORAL | Status: DC
  Administered 2013-09-02 – 2013-09-03 (×2): 25 mg via ORAL
  Filled 2013-09-01 (×3): qty 1

## 2013-09-01 MED ORDER — OXYCODONE-ACETAMINOPHEN 5-325 MG PO TABS
1.0000 | ORAL_TABLET | Freq: Four times a day (QID) | ORAL | Status: DC | PRN
  Administered 2013-09-02 (×2): 1 via ORAL
  Filled 2013-09-01 (×2): qty 1

## 2013-09-01 MED ORDER — PANTOPRAZOLE SODIUM 40 MG PO TBEC
40.0000 mg | DELAYED_RELEASE_TABLET | Freq: Two times a day (BID) | ORAL | Status: DC
  Administered 2013-09-01 – 2013-09-03 (×5): 40 mg via ORAL
  Filled 2013-09-01 (×5): qty 1

## 2013-09-01 MED ORDER — POLYVINYL ALCOHOL 1.4 % OP SOLN: 1.0000 [drp] | Freq: Every day | OPHTHALMIC | Status: DC | PRN

## 2013-09-01 NOTE — ED Notes (Signed)
Attempted to place foley without success, used regular foley cath and coude, pt reports that he has problems in the past with foley's being placed, Dr Roderic Palau notified,

## 2013-09-01 NOTE — ED Notes (Addendum)
Pt presents to er for further evaluation of left upper quad abd/left lower chest pain that has been intermittent for the past few days, pt states that he was recently admitted to hospital for CHF, was seen by cardiologist after discharge and had his medications "changed:" pt was placed on metolazone 2.5 QD, and torsemide 20 mg 2 tablets BID, pt states that the swelling he was having has went down 2-3 sizes in his lower legs, pt alert, able to answer questions, pulse ox on RA 88-92% on RA, able to speak in complete sentences, does have sob that increases with exertion, abd distention noted and bilateral lower leg swelling that extends to knee area but pt states that the swelling is better in both area, reports 8-10 lb weight loss over the past week, Dr Roderic Palau at bedside, see edp assessment for further, pt on cardiac monitor NSR noted,

## 2013-09-01 NOTE — Plan of Care (Signed)
Problem: Phase I Progression Outcomes Goal: Voiding-avoid urinary catheter unless indicated Outcome: Completed/Met Date Met:  09/01/13 Patient uses urinal at bedside.

## 2013-09-01 NOTE — ED Notes (Signed)
CRITICAL VALUE ALERT  Critical value received:  Potassium 2.4, CO2 41  Date of notification:  09/01/2013 Time of notification:  11:11  Critical value read back: yes  Nurse who received alert:  Rip Harbour, RN   MD notified (1st page):  Dr Roderic Palau Time of first page: 11:11  MD notified (2nd page):  Time of second page:  Responding MD:  Dr Roderic Palau  Time MD responded:  11:11

## 2013-09-01 NOTE — ED Notes (Signed)
Pt presents to ED from home. Reports chest pain/pressure starting 3 days ago with new medication. Pt reports nausea, weakness, ShOB. Hx of CHF, HTN. Pt states " I can't take a deep breath." Airway patent.

## 2013-09-01 NOTE — ED Notes (Signed)
Dr.Memon at bedside. 

## 2013-09-01 NOTE — ED Notes (Signed)
Orthostatic vital in standing position not attempted due to pt stating that he is unsteady on his feet and is not sure about standing up.

## 2013-09-01 NOTE — ED Provider Notes (Signed)
CSN: [id number]     Arrival date & time 09/01/13  1002 History  This chart was scribed for Maudry Diego, MD by Ludger Nutting, ED Scribe. This patient was seen in room APA01/APA01 and the patient's care was started 10:26 AM.    Chief Complaint  Patient presents with  . Chest Pain      Patient is a 73 y.o. male presenting with shortness of breath. The history is provided by the patient. No language interpreter was used.  Shortness of Breath Severity:  Moderate Duration:  3 days Timing:  Constant Progression:  Worsening Ineffective treatments:  Diuretics Associated symptoms: no rash   Associated symptoms comment:  Dry throat   HPI Comments: [name] [name] is a 73 y.o. male with past medical history of CHF, HLD who presents to the Emergency Department complaining of 3 days of gradual onset, gradually worsening, constant SOB and generalized weakness. He also has associated dry mouth and throat. Patient states he was initially taking Lasix 40 mg TID but this was changed by his cardiologist to Demadex 20 mg BID and metolazone 2.5 QD after being discharged from the hospital on 08/28/13. Patient was last seen in the ED on 08/20/13 when he was admitted to the hospital for 8 days. He reports losing 8-10 lbs in the past 5 days and states his leg swelling also improved. Patient regularly uses oxygen at night.   Past Medical History  Diagnosis Date  . CHF (congestive heart failure)   . Obesity   . Hyperlipidemia   . PUD (peptic ulcer disease)   . GERD (gastroesophageal reflux disease)   . S/P endoscopy 2007, 2012    2007: nl, May 2012: antral erosions  . Osteoarthritis   . S/P colonoscopy 2007, Feb and March 2012    hx of adenomas, left-sided diverticula, On 07/2010 TCS, fresh blood and clot noted coming from TI.  Marland Kitchen Anxiety   . Hypothyroidism     hypothyroid  . Ringing in ears   . History of kidney stones   . History of bladder infections   . Heel spur     right heel  . Collagen vascular  disease     history of venous insufficency and LE cellulitis  . Thrombocytopenia 12/30/2011    Stable  . BPH (benign prostatic hyperplasia)   . Hypertension     Sees Dr. Debara Pickett  . Bilateral lower leg cellulitis   . Sleep apnea     uses oxygen 2L via [place] cannot tolerate CPAP - sleep study 2008 [facility] 48.80/hr and 56.70hr during REM  . Complication of anesthesia     due to sleep apnea pt has had difficulty being put under anesthesia  . PONV (postoperative nausea and vomiting)   . Deep venous insufficiency   . Chronic diastolic HF (heart failure)    Past Surgical History  Procedure Laterality Date  . Knee arthroscopy    . Rotator cuff repair    . Cholecystectomy    . Lumbar spine surgery    . Small bowel capsule study  07/2011    ?transient focal ischemia in distal small bowel to explain GI bleeding  . Esophagogastroduodenoscopy  Aug 2013    [facility], single 45mm pedunculated polyp otherwise normal. HYPERPLASTIC, NEGATIVE h.pylori  . Cardiac catheterization  02/27/2007    no sign of CAD, LVH, mod pulm HTN (Dr. Jackie Plum(  . Transurethral resection of prostate    . Transurethral resection of prostate N/A 06/18/2013    Procedure: TRANSURETHRAL  RESECTION OF THE PROSTATE (TURP);  Surgeon: Marissa Nestle, MD;  Location: [facility];  Service: Urology;  Laterality: N/A;  . Transthoracic echocardiogram  09/2008    EF 60-65%, mod conc LVH  . Nm myocar perf wall motion  12/2006    dipyridamole myoview - stress images show mild perfusion defect in mid inferior walls with reversibility at rest; mild perfusion defect in mid inferolateral wall at stress with mild defect reversibility, EF 62%, abnormal but low risk study    Family History  Problem Relation Age of Onset  . Colon cancer Father 58    deceased  . Prostate cancer Father   . Pancreatic cancer Mother 69    deceased   History  Substance Use Topics  . Smoking status: Former Smoker -- 8 years    Types: Cigars    Quit date: 08/10/1973  . Smokeless  tobacco: Former Systems developer    Quit date: 08/10/1973  . Alcohol Use: No    Review of Systems  Constitutional: Negative for appetite change.  HENT: Negative for congestion, ear discharge and sinus pressure.   Eyes: Negative for discharge.  Respiratory: Positive for shortness of breath.   Cardiovascular: Positive for leg swelling.  Gastrointestinal: Negative for diarrhea.  Genitourinary: Negative for frequency and hematuria.  Skin: Negative for rash.  Neurological: Positive for weakness (generalized). Negative for seizures.  Psychiatric/Behavioral: Negative for hallucinations.      Allergies  Aspirin  Home Medications   Current Outpatient Rx  Name  Route  Sig  Dispense  Refill  . acetaminophen (TYLENOL) 325 MG tablet   Oral   Take 325-650 mg by mouth every 6 (six) hours as needed for pain.          Marland Kitchen atorvastatin (LIPITOR) 80 MG tablet   Oral   Take 80 mg by mouth at bedtime.         . cetirizine (ZYRTEC) 10 MG tablet   Oral   Take 10 mg by mouth daily.         . cyclobenzaprine (FLEXERIL) 10 MG tablet   Oral   Take 1 tablet (10 mg total) by mouth 3 (three) times daily as needed for muscle spasms.   45 tablet   0   . docusate sodium (COLACE) 100 MG capsule   Oral   Take 100 mg by mouth daily as needed for mild constipation.          . Dorzolamide HCl-Timolol Mal PF 22.3-6.8 MG/ML SOLN   Ophthalmic   Apply 1 drop to eye 2 (two) times daily. Dorzolamide HCI-Timolol Maleate Ophthalmic Solution 2.23%/0.68%         . latanoprost (XALATAN) 0.005 % ophthalmic solution   Both Eyes   Place 1 drop into both eyes at bedtime.         . metolazone (ZAROXOLYN) 2.5 MG tablet      Take 1 tablet by mouth once daily 30 minutes prior to AM dose of torsemide.   90 tablet   3   . metoprolol succinate (TOPROL-XL) 25 MG 24 hr tablet   Oral   Take 1 tablet (25 mg total) by mouth every morning.   30 tablet   11   . oxyCODONE-acetaminophen (PERCOCET) 10-325 MG per  tablet   Oral   Take 1 tablet by mouth every 6 (six) hours as needed for pain.   90 tablet   0   . pantoprazole (PROTONIX) 40 MG tablet   Oral   Take 40  mg by mouth 2 (two) times daily.         Vladimir Faster Glycol-Propyl Glycol (LUBRICANT EYE DROPS) 0.4-0.3 % SOLN   Ophthalmic   Apply 1 drop to eye daily as needed (for lubricant eye drops).         . potassium chloride SA (K-DUR,KLOR-CON) 20 MEQ tablet   Oral   Take 1 tablet (20 mEq total) by mouth 2 (two) times daily.   60 tablet   0   . Tamsulosin HCl (FLOMAX) 0.4 MG CAPS   Oral   Take 0.4 mg by mouth daily after supper.          . torsemide (DEMADEX) 20 MG tablet   Oral   Take 60 mg by mouth 2 (two) times daily.          BP 158/87  Pulse 98  Temp(Src) 97.8 F (36.6 C) (Oral)  Resp 18  Ht 5\' 7"  (1.702 m)  Wt 283 lb (128.368 kg)  BMI 44.31 kg/m2  SpO2 92% Physical Exam  Nursing note and vitals reviewed. Constitutional: He is oriented to person, place, and time. He appears well-developed.  HENT:  Head: Normocephalic.  Mouth/Throat: Mucous membranes are dry.  Eyes: Conjunctivae and EOM are normal. No scleral icterus.  Neck: Neck supple. No thyromegaly present.  Cardiovascular: Normal rate, regular rhythm and normal heart sounds.  Exam reveals no gallop and no friction rub.   No murmur heard. Pulmonary/Chest: Effort normal and breath sounds normal. No stridor. He has no wheezes. He has no rales. He exhibits no tenderness.  Abdominal: He exhibits no distension. There is no tenderness. There is no rebound.  Musculoskeletal: Normal range of motion. He exhibits edema (to bilateral ankles).  Lymphadenopathy:    He has no cervical adenopathy.  Neurological: He is oriented to person, place, and time. He exhibits normal muscle tone. Coordination normal.  Skin: No rash noted. No erythema.  Psychiatric: He has a normal mood and affect. His behavior is normal.    ED Course  Procedures (including critical care  time)  DIAGNOSTIC STUDIES: Oxygen Saturation is 92% on [place] 2L, low by my interpretation.    COORDINATION OF CARE: 10:31 AM Discussed treatment plan with pt at bedside and pt agreed to plan.  12:26 PM Upon recheck, patient states he continues to feel slightly SOB. Discussed that his potassium levels are low and WBC is elevated. Patient denies recent fever, cough but states he has been feeling fatigued.    Labs Review Labs Reviewed  CBC WITH DIFFERENTIAL - Abnormal; Notable for the following:    WBC 18.7 (*)    RBC 6.32 (*)    MCV 76.6 (*)    RDW 17.4 (*)    Neutrophils Relative % 83 (*)    Neutro Abs 15.6 (*)    All other components within normal limits  COMPREHENSIVE METABOLIC PANEL - Abnormal; Notable for the following:    Sodium 135 (*)    Potassium 2.4 (*)    Chloride 81 (*)    CO2 41 (*)    Glucose, Bld 170 (*)    BUN 44 (*)    Creatinine, Ser 1.40 (*)    Total Bilirubin 1.7 (*)    GFR calc non Af Amer 48 (*)    GFR calc Af Amer 56 (*)    All other components within normal limits  CULTURE, BLOOD (ROUTINE X 2)  CULTURE, BLOOD (ROUTINE X 2)  TROPONIN I  PRO B NATRIURETIC PEPTIDE  URINALYSIS,  ROUTINE W REFLEX MICROSCOPIC   Imaging Review No results found.   EKG Interpretation None      MDM   Final diagnoses:  None   The chart was scribed for me under my direct supervision.  I personally performed the history, physical, and medical decision making and all procedures in the evaluation of this patient.Maudry Diego, MD 09/01/13 [phone]

## 2013-09-01 NOTE — ED Notes (Signed)
Report given to the floor, pt and family updated on room number,

## 2013-09-01 NOTE — H&P (Signed)
[facility] History and Physical  [name] [name] BZJ:[id number] DOB: 1941-03-19 DOA: 09/01/2013  Referring physician: Dr. Roderic Palau, ER physician PCP: [name]., MD   Chief Complaint: dizziness, weakness  HPI: [name] [name] is a 73 y.o. male history right-sided heart failure has had repeated admissions for volume overload. He was recently discharged on 08/24/13 after being treated for volume overload and acute on chronic diastolic congestive heart failure/right-sided heart failure. The patient was discharged on Demadex 40 mg by mouth twice a day. After returning home, as noted the patient was starting to gain weight and become more edematous. He followed up with his cardiologist on 4/7 and his diuretics were changed to Demadex 60 mg by mouth twice a day as well as metolazone 2.5 mg daily. The patient continued with his Demadex 40 mg twice a day to start taking metolazone 2.5 mg daily. He reports a significant weight loss over the past week approximately 10 pounds. He is feeling increasingly lightheaded dizzy, and generally weak. He occasionally feels short of breath. He does describes intermittent left upper quadrant/chest pain. This lasts for a few seconds to a few minutes. It is sharp in nature. Can occur on exertion as well as rest. He is feeling increasingly thirsty. His urine has been dark. No dysuria. No vomiting or diarrhea. He was evaluated in the emergency room where potassium was slightly low at 2.4 and had evidence of dehydration. The patient is being admitted for further treatment.   Review of Systems:  Pertinent positives as per HPI, otherwise negative.   Past Medical History  Diagnosis Date  . CHF (congestive heart failure)   . Obesity   . Hyperlipidemia   . PUD (peptic ulcer disease)   . GERD (gastroesophageal reflux disease)   . S/P endoscopy 2007, 2012    2007: nl, May 2012: antral erosions  . Osteoarthritis   . S/P colonoscopy 2007, Feb and March 2012    hx of  adenomas, left-sided diverticula, On 07/2010 TCS, fresh blood and clot noted coming from TI.  Marland Kitchen Anxiety   . Hypothyroidism     hypothyroid  . Ringing in ears   . History of kidney stones   . History of bladder infections   . Heel spur     right heel  . Collagen vascular disease     history of venous insufficency and LE cellulitis  . Thrombocytopenia 12/30/2011    Stable  . BPH (benign prostatic hyperplasia)   . Hypertension     Sees Dr. Debara Pickett  . Bilateral lower leg cellulitis   . Sleep apnea     uses oxygen 2L via [place] cannot tolerate CPAP - sleep study 2008 AHI 48.80/hr and 56.70hr during REM  . Complication of anesthesia     due to sleep apnea pt has had difficulty being put under anesthesia  . PONV (postoperative nausea and vomiting)   . Deep venous insufficiency   . Chronic diastolic HF (heart failure)    Past Surgical History  Procedure Laterality Date  . Knee arthroscopy    . Rotator cuff repair    . Cholecystectomy    . Lumbar spine surgery    . Small bowel capsule study  07/2011    ?transient focal ischemia in distal small bowel to explain GI bleeding  . Esophagogastroduodenoscopy  Aug 2013    [facility], single 25mm pedunculated polyp otherwise normal. HYPERPLASTIC, NEGATIVE h.pylori  . Cardiac catheterization  02/27/2007    no sign of CAD, LVH, mod pulm HTN (Dr.  Jackie Plum(  . Transurethral resection of prostate    . Transurethral resection of prostate N/A 06/18/2013    Procedure: TRANSURETHRAL RESECTION OF THE PROSTATE (TURP);  Surgeon: Marissa Nestle, MD;  Location: [facility];  Service: Urology;  Laterality: N/A;  . Transthoracic echocardiogram  09/2008    EF 60-65%, mod conc LVH  . Nm myocar perf wall motion  12/2006    dipyridamole myoview - stress images show mild perfusion defect in mid inferior walls with reversibility at rest; mild perfusion defect in mid inferolateral wall at stress with mild defect reversibility, EF 62%, abnormal but low risk study    Social History:   reports that he quit smoking about 40 years ago. His smoking use included Cigars. He quit smokeless tobacco use about 40 years ago. He reports that he does not drink alcohol or use illicit drugs.  Allergies  Allergen Reactions  . Aspirin Other (See Comments)    Nausea and upset stomach      Family History  Problem Relation Age of Onset  . Colon cancer Father 35    deceased  . Prostate cancer Father   . Pancreatic cancer Mother 55    deceased     Prior to Admission medications   Medication Sig Start Date End Date Taking? Authorizing Provider  acetaminophen (TYLENOL) 325 MG tablet Take 325-650 mg by mouth every 6 (six) hours as needed for pain.    Yes Historical Provider, MD  atorvastatin (LIPITOR) 80 MG tablet Take 80 mg by mouth at bedtime.   Yes Historical Provider, MD  cetirizine (ZYRTEC) 10 MG tablet Take 10 mg by mouth daily.   Yes Historical Provider, MD  latanoprost (XALATAN) 0.005 % ophthalmic solution Place 1 drop into both eyes at bedtime.   Yes Historical Provider, MD  metolazone (ZAROXOLYN) 2.5 MG tablet Take 1 tablet by mouth once daily 30 minutes prior to AM dose of torsemide. 08/27/13  Yes Pixie Casino, MD  metoprolol succinate (TOPROL-XL) 25 MG 24 hr tablet Take 1 tablet (25 mg total) by mouth every morning. 08/24/13  Yes Theodis Blaze, MD  pantoprazole (PROTONIX) 40 MG tablet Take 40 mg by mouth 2 (two) times daily.   Yes Historical Provider, MD  Polyethyl Glycol-Propyl Glycol (LUBRICANT EYE DROPS) 0.4-0.3 % SOLN Apply 1 drop to eye daily as needed (for lubricant eye drops).   Yes Historical Provider, MD  potassium chloride SA (K-DUR,KLOR-CON) 20 MEQ tablet Take 1 tablet (20 mEq total) by mouth 2 (two) times daily. 06/17/12  Yes Nimish Luther Parody, MD  Tamsulosin HCl (FLOMAX) 0.4 MG CAPS Take 0.4 mg by mouth daily after supper.    Yes Historical Provider, MD  timolol (TIMOPTIC-XR) 0.5 % ophthalmic gel-forming Place 1 drop into both eyes 2 (two) times daily.   Yes Historical  Provider, MD  torsemide (DEMADEX) 20 MG tablet Take 40 mg by mouth 2 (two) times daily.  08/24/13  Yes Theodis Blaze, MD  cyclobenzaprine (FLEXERIL) 10 MG tablet Take 1 tablet (10 mg total) by mouth 3 (three) times daily as needed for muscle spasms. 08/31/12   Winfield Cunas, MD  docusate sodium (COLACE) 100 MG capsule Take 100 mg by mouth daily as needed for mild constipation.     Historical Provider, MD  oxyCODONE-acetaminophen (PERCOCET) 10-325 MG per tablet Take 1 tablet by mouth every 6 (six) hours as needed for pain. 08/24/13   Theodis Blaze, MD   Physical Exam: Filed Vitals:   09/01/13 1321  BP: 118/79  Pulse: 88  Temp:   Resp: 20    BP 118/79  Pulse 88  Temp(Src) 97.8 F (36.6 C) (Oral)  Resp 20  Ht 5\' 7"  (1.702 m)  Wt 128.368 kg (283 lb)  BMI 44.31 kg/m2  SpO2 95%  General:  Appears calm and comfortable Eyes: PERRL, normal lids, irises & conjunctiva ENT: mucous membranes are dry Neck: no LAD, masses or thyromegaly Cardiovascular: RRR, no m/r/g. Trace to 1+ LE edema. Telemetry: SR, no arrhythmias  Respiratory: Crackles at bases. Normal respiratory effort. Abdomen: soft, ntnd Skin: no rash or induration seen on limited exam Musculoskeletal: grossly normal tone BUE/BLE Psychiatric: grossly normal mood and affect, speech fluent and appropriate Neurologic: grossly non-focal.          Labs on Admission:  Basic Metabolic Panel:  Recent Labs Lab 08/30/13 0940 09/01/13 1037  NA 136 135*  K 2.8* 2.4*  CL 84* 81*  CO2 39* 41*  GLUCOSE 160* 170*  BUN 35* 44*  CREATININE 1.39* 1.40*  CALCIUM 10.2 9.9   Liver Function Tests:  Recent Labs Lab 09/01/13 1037  AST 22  ALT 21  ALKPHOS 110  BILITOT 1.7*  PROT 7.5  ALBUMIN 3.5   No results found for this basename: LIPASE, AMYLASE,  in the last 168 hours No results found for this basename: AMMONIA,  in the last 168 hours CBC:  Recent Labs Lab 09/01/13 1037  WBC 18.7*  NEUTROABS 15.6*  HGB 16.7  HCT 48.4    MCV 76.6*  PLT 152   Cardiac Enzymes:  Recent Labs Lab 09/01/13 1037  TROPONINI <0.30    BNP (last 3 results)  Recent Labs  08/20/13 1115 08/22/13 0503 09/01/13 1037  PROBNP 10.0 11.3 59.0   CBG: No results found for this basename: GLUCAP,  in the last 168 hours  Radiological Exams on Admission: Dg Chest Portable 1 View  09/01/2013   CLINICAL DATA:  Chest pain.  EXAM: PORTABLE CHEST - 1 VIEW  COMPARISON:  August 20, 2013.  FINDINGS: The heart size and mediastinal contours are within normal limits. Both lungs are clear. No pneumothorax or pleural effusion is noted. The visualized skeletal structures are unremarkable.  IMPRESSION: No acute cardiopulmonary abnormality seen.   Electronically Signed   By: Sabino Dick M.D.   On: 09/01/2013 10:50    EKG: Independently reviewed. NSR  Assessment/Plan Active Problems:   HYPERTENSION   OSA (obstructive sleep apnea)   Morbid obesity- BMI 44.5   Right-sided heart failure   Hypokalemia   Dehydration   Chest pain   Leukocytosis, unspecified   1. Hypokalemia. Likely related to diuretics. Will replace and check magnesium. 2. Dehydration. Due to over diuresis. Will provide gentle hydration and monitor closely. Hold diuretics for now. 3. Right-sided heart failure/chronic diastolic congestive heart failure. Appears compensated at this time. We'll continue to monitor carefully. 4. Hypertension. Stable, continue Lopressor. 5. Leukocytosis, likely related to hemoconcentration. No evidence of ongoing infection. Recheck with patient's hydrated. 6. Chest pain. Appears to be musculoskeletal. We'll cycle cardiac markers. 7. Obstructive sleep apnea. Patient is undergoing CPAP mask fitting.  Code Status: full code Family Communication: discussed with patient Disposition Plan: discharge home once improved  Time spent: 40mins  Khristin Keleher [facility] Pager [phone]

## 2013-09-02 DIAGNOSIS — I5032 Chronic diastolic (congestive) heart failure: Secondary | ICD-10-CM | POA: Diagnosis not present

## 2013-09-02 DIAGNOSIS — R079 Chest pain, unspecified: Secondary | ICD-10-CM | POA: Diagnosis not present

## 2013-09-02 DIAGNOSIS — D72829 Elevated white blood cell count, unspecified: Secondary | ICD-10-CM | POA: Diagnosis not present

## 2013-09-02 DIAGNOSIS — N19 Unspecified kidney failure: Secondary | ICD-10-CM | POA: Diagnosis not present

## 2013-09-02 DIAGNOSIS — E876 Hypokalemia: Secondary | ICD-10-CM | POA: Diagnosis not present

## 2013-09-02 DIAGNOSIS — I509 Heart failure, unspecified: Secondary | ICD-10-CM | POA: Diagnosis not present

## 2013-09-02 DIAGNOSIS — E860 Dehydration: Secondary | ICD-10-CM | POA: Diagnosis not present

## 2013-09-02 LAB — BASIC METABOLIC PANEL
BUN: 31 mg/dL — ABNORMAL HIGH (ref 6–23)
CHLORIDE: 91 meq/L — AB (ref 96–112)
CO2: 43 meq/L — AB (ref 19–32)
Calcium: 9.3 mg/dL (ref 8.4–10.5)
Creatinine, Ser: 1.2 mg/dL (ref 0.50–1.35)
GFR calc non Af Amer: 58 mL/min — ABNORMAL LOW (ref >90)
GFR, EST AFRICAN AMERICAN: 67 mL/min — AB (ref >90)
Glucose, Bld: 155 mg/dL — ABNORMAL HIGH (ref 70–99)
Potassium: 3.5 mEq/L — ABNORMAL LOW (ref 3.7–5.3)
SODIUM: 141 meq/L (ref 137–147)

## 2013-09-02 LAB — CBC
HCT: 45.5 % (ref 39.0–52.0)
Hemoglobin: 15 g/dL (ref 13.0–17.0)
MCH: 25.9 pg — ABNORMAL LOW (ref 26.0–34.0)
MCHC: 33 g/dL (ref 30.0–36.0)
MCV: 78.4 fL (ref 78.0–100.0)
PLATELETS: 136 10*3/uL — AB (ref 150–400)
RBC: 5.8 MIL/uL (ref 4.22–5.81)
RDW: 17.7 % — ABNORMAL HIGH (ref 11.5–15.5)
WBC: 15.5 10*3/uL — ABNORMAL HIGH (ref 4.0–10.5)

## 2013-09-02 LAB — TROPONIN I

## 2013-09-02 MED ORDER — POTASSIUM CHLORIDE CRYS ER 20 MEQ PO TBCR
40.0000 meq | EXTENDED_RELEASE_TABLET | Freq: Once | ORAL | Status: AC
  Administered 2013-09-02: 40 meq via ORAL
  Filled 2013-09-02: qty 2

## 2013-09-02 NOTE — Progress Notes (Signed)
09/02/13 1818 Patient assisted up to chair times two today, tolerated well. Instructed to call for assist and not get up on his own. Call light within reach. States will call. Donavan Foil, RN

## 2013-09-02 NOTE — Progress Notes (Signed)
Utilization Review Complete  

## 2013-09-02 NOTE — Progress Notes (Signed)
[facility] PROGRESS NOTE  [name] [name] AYT:[id number] DOB: 11/17/1940 DOA: 09/01/2013 PCP: [name]., MD  Assessment/Plan: 1. Hypokalemia, related to diuretics. Improving, continue to replace 2. Dehydration, due to overdiuresis. Improving with IV fluids. Continue hydration for 1 more day. 3. Right-sided heart failure/chronic diastolic congestive heart failure. Appears to be compensated. 4. Hypertension. Continue Lopressor. 5. Leukocytosis, due to hemoconcentration. No evidence of ongoing infection. Improving with hydration. 6. Chest pain, musculoskeletal. EKG is unremarkable. Cardiac enzymes are negative. 7. Obstructive sleep apnea.  Code Status: full code Family Communication: discussed with patient Disposition Plan: discharge home once improved   Consultants:    Procedures:    Antibiotics:    HPI/Subjective: Feeling a little better today.  Still feels lightheaded on standing.  Objective: Filed Vitals:   09/02/13 1345  BP: 113/69  Pulse: 78  Temp: 97.5 F (36.4 C)  Resp: 18    Intake/Output Summary (Last 24 hours) at 09/02/13 1927 Last data filed at 09/02/13 1900  Gross per 24 hour  Intake 1984.25 ml  Output   2000 ml  Net -15.75 ml   Filed Weights   09/01/13 1011 09/02/13 0654  Weight: 128.368 kg (283 lb) 130.318 kg (287 lb 4.8 oz)    Exam:   General:  NAD  Cardiovascular: S1, S2 RRR  Respiratory: cta b  Abdomen: soft, nt, bs+  Musculoskeletal: no edema b/l   Data Reviewed: Basic Metabolic Panel:  Recent Labs Lab 08/30/13 0940 09/01/13 1037 09/01/13 1459 09/02/13 0436  NA 136 135*  --  141  K 2.8* 2.4*  --  3.5*  CL 84* 81*  --  91*  CO2 39* 41*  --  43*  GLUCOSE 160* 170*  --  155*  BUN 35* 44*  --  31*  CREATININE 1.39* 1.40*  --  1.20  CALCIUM 10.2 9.9  --  9.3  MG  --   --  2.5  --    Liver Function Tests:  Recent Labs Lab 09/01/13 1037  AST 22  ALT 21  ALKPHOS 110  BILITOT 1.7*  PROT 7.5  ALBUMIN  3.5   No results found for this basename: LIPASE, AMYLASE,  in the last 168 hours No results found for this basename: AMMONIA,  in the last 168 hours CBC:  Recent Labs Lab 09/01/13 1037 09/02/13 0436  WBC 18.7* 15.5*  NEUTROABS 15.6*  --   HGB 16.7 15.0  HCT 48.4 45.5  MCV 76.6* 78.4  PLT 152 136*   Cardiac Enzymes:  Recent Labs Lab 09/01/13 1037 09/01/13 1625 09/01/13 2155 09/02/13 0436  TROPONINI <0.30 <0.30 <0.30 <0.30   BNP (last 3 results)  Recent Labs  08/20/13 1115 08/22/13 0503 09/01/13 1037  PROBNP 10.0 11.3 59.0   CBG: No results found for this basename: GLUCAP,  in the last 168 hours  Recent Results (from the past 240 hour(s))  CULTURE, BLOOD (ROUTINE X 2)     Status: None   Collection Time    09/01/13 11:38 AM      Result Value Ref Range Status   Specimen Description LEFT ANTECUBITAL   Final   Special Requests BOTTLES DRAWN AEROBIC AND ANAEROBIC 8CC   Final   Culture NO GROWTH 1 DAY   Final   Report Status PENDING   Incomplete  CULTURE, BLOOD (ROUTINE X 2)     Status: None   Collection Time    09/01/13 11:38 AM      Result Value Ref Range Status   Specimen  Description LEFT ANTECUBITAL   Final   Special Requests BOTTLES DRAWN AEROBIC AND ANAEROBIC 8CC   Final   Culture NO GROWTH 1 DAY   Final   Report Status PENDING   Incomplete     Studies: Dg Chest Portable 1 View  09/01/2013   CLINICAL DATA:  Chest pain.  EXAM: PORTABLE CHEST - 1 VIEW  COMPARISON:  August 20, 2013.  FINDINGS: The heart size and mediastinal contours are within normal limits. Both lungs are clear. No pneumothorax or pleural effusion is noted. The visualized skeletal structures are unremarkable.  IMPRESSION: No acute cardiopulmonary abnormality seen.   Electronically Signed   By: Sabino Dick M.D.   On: 09/01/2013 10:50    Scheduled Meds: . atorvastatin  80 mg Oral QHS  . enoxaparin (LOVENOX) injection  40 mg Subcutaneous Q24H  . latanoprost  1 drop Both Eyes QHS  .  metoprolol succinate  25 mg Oral q morning - 10a  . pantoprazole  40 mg Oral BID  . sodium chloride  3 mL Intravenous Q12H  . tamsulosin  0.4 mg Oral QPC supper  . timolol  1 drop Both Eyes BID   Continuous Infusions: . 0.9 % NaCl with KCl 40 mEq / L 75 mL/hr at 09/02/13 1812    Active Problems:   HYPERTENSION   OSA (obstructive sleep apnea)   Morbid obesity- BMI 44.5   Right-sided heart failure   Hypokalemia   Dehydration   Chest pain   Leukocytosis, unspecified    Time spent: 3mins    [name] [name]  [facility] Pager [phone]. If 7PM-7AM, please contact night-coverage at [email or web address], password [facility] 09/02/2013, 7:27 PM  LOS: 1 day

## 2013-09-02 NOTE — Progress Notes (Signed)
09/02/13 1353 Patient c/o left side chest pain, rated 7/10 on pain scale of 0-10. Stated "my left arm is aching too". C/o feeling slightly short of breath. O2 sats 94% r/a, O2 applied at 2 lpm for comfort. VSS, see flowsheet. States chest pain "comes and goes at home lately". States does not have to take nitroglycerin, pain managed with percocet at home. Given percocet/oxycodone as ordered PRN pain. Notified Dr. Roderic Palau. EKG to be obtained per chest pain protocol. MD aware troponins have been negative. Donavan Foil, RN

## 2013-09-02 NOTE — Progress Notes (Signed)
UR chart review completed.  

## 2013-09-03 DIAGNOSIS — R079 Chest pain, unspecified: Secondary | ICD-10-CM | POA: Diagnosis not present

## 2013-09-03 DIAGNOSIS — E876 Hypokalemia: Secondary | ICD-10-CM | POA: Diagnosis not present

## 2013-09-03 DIAGNOSIS — E860 Dehydration: Secondary | ICD-10-CM | POA: Diagnosis not present

## 2013-09-03 DIAGNOSIS — I509 Heart failure, unspecified: Secondary | ICD-10-CM | POA: Diagnosis not present

## 2013-09-03 LAB — CBC
HEMATOCRIT: 43.2 % (ref 39.0–52.0)
Hemoglobin: 14.1 g/dL (ref 13.0–17.0)
MCH: 25.9 pg — ABNORMAL LOW (ref 26.0–34.0)
MCHC: 32.6 g/dL (ref 30.0–36.0)
MCV: 79.3 fL (ref 78.0–100.0)
Platelets: 117 10*3/uL — ABNORMAL LOW (ref 150–400)
RBC: 5.45 MIL/uL (ref 4.22–5.81)
RDW: 17.5 % — AB (ref 11.5–15.5)
WBC: 14.4 10*3/uL — ABNORMAL HIGH (ref 4.0–10.5)

## 2013-09-03 LAB — BASIC METABOLIC PANEL
BUN: 20 mg/dL (ref 6–23)
CO2: 38 meq/L — AB (ref 19–32)
CREATININE: 1.02 mg/dL (ref 0.50–1.35)
Calcium: 9.1 mg/dL (ref 8.4–10.5)
Chloride: 95 mEq/L — ABNORMAL LOW (ref 96–112)
GFR calc non Af Amer: 71 mL/min — ABNORMAL LOW (ref >90)
GFR, EST AFRICAN AMERICAN: 82 mL/min — AB (ref >90)
Glucose, Bld: 155 mg/dL — ABNORMAL HIGH (ref 70–99)
POTASSIUM: 3.8 meq/L (ref 3.7–5.3)
Sodium: 138 mEq/L (ref 137–147)

## 2013-09-03 MED ORDER — METOLAZONE 2.5 MG PO TABS: ORAL_TABLET | ORAL | Status: DC

## 2013-09-03 NOTE — Discharge Instructions (Signed)
Dehydration, Adult Dehydration is when you lose more fluids from the body than you take in. Vital organs like the kidneys, brain, and heart cannot function without a proper amount of fluids and salt. Any loss of fluids from the body can cause dehydration.  CAUSES   Vomiting.  Diarrhea.  Excessive sweating.  Excessive urine output.  Fever. SYMPTOMS  Mild dehydration  Thirst.  Dry lips.  Slightly dry mouth. Moderate dehydration  Very dry mouth.  Sunken eyes.  Skin does not bounce back quickly when lightly pinched and released.  Dark urine and decreased urine production.  Decreased tear production.  Headache. Severe dehydration  Very dry mouth.  Extreme thirst.  Rapid, weak pulse (more than 100 beats per minute at rest).  Cold hands and feet.  Not able to sweat in spite of heat and temperature.  Rapid breathing.  Blue lips.  Confusion and lethargy.  Difficulty being awakened.  Minimal urine production.  No tears. DIAGNOSIS  Your caregiver will diagnose dehydration based on your symptoms and your exam. Blood and urine tests will help confirm the diagnosis. The diagnostic evaluation should also identify the cause of dehydration. TREATMENT  Treatment of mild or moderate dehydration can often be done at home by increasing the amount of fluids that you drink. It is best to drink small amounts of fluid more often. Drinking too much at one time can make vomiting worse. Refer to the home care instructions below. Severe dehydration needs to be treated at the hospital where you will probably be given intravenous (IV) fluids that contain water and electrolytes. HOME CARE INSTRUCTIONS   Ask your caregiver about specific rehydration instructions.  Drink enough fluids to keep your urine clear or pale yellow.  Drink small amounts frequently if you have nausea and vomiting.  Eat as you normally do.  Avoid:  Foods or drinks high in sugar.  Carbonated  drinks.  Juice.  Extremely hot or cold fluids.  Drinks with caffeine.  Fatty, greasy foods.  Alcohol.  Tobacco.  Overeating.  Gelatin desserts.  Wash your hands well to avoid spreading bacteria and viruses.  Only take over-the-counter or prescription medicines for pain, discomfort, or fever as directed by your caregiver.  Ask your caregiver if you should continue all prescribed and over-the-counter medicines.  Keep all follow-up appointments with your caregiver. SEEK MEDICAL CARE IF:  You have abdominal pain and it increases or stays in one area (localizes).  You have a rash, stiff neck, or severe headache.  You are irritable, sleepy, or difficult to awaken.  You are weak, dizzy, or extremely thirsty. SEEK IMMEDIATE MEDICAL CARE IF:   You are unable to keep fluids down or you get worse despite treatment.  You have frequent episodes of vomiting or diarrhea.  You have blood or green matter (bile) in your vomit.  You have blood in your stool or your stool looks black and tarry.  You have not urinated in 6 to 8 hours, or you have only urinated a small amount of very dark urine.  You have a fever.  You faint. MAKE SURE YOU:   Understand these instructions.  Will watch your condition.  Will get help right away if you are not doing well or get worse. Document Released: 05/09/2005 Document Revised: 08/01/2011 Document Reviewed: 12/27/2010 [employer] Patient Information 2014 [employer].  

## 2013-09-03 NOTE — Discharge Summary (Signed)
Physician Discharge Summary  Cristien Jellison [id number] DOB: 06-23-1940 DOA: 09/01/2013  PCP: [name]., MD  Admit date: 09/01/2013 Discharge date: 09/03/2013  Time spent:40 minutes  Recommendations for Outpatient Follow-up:  1. Followup with Dr. Debara Pickett on Friday as previously scheduled 2. Patient has been set up with home health RN  Discharge Diagnoses:  Active Problems:   HYPERTENSION   OSA (obstructive sleep apnea)   Morbid obesity- BMI 44.5   Right-sided heart failure   Hypokalemia   Dehydration   Chest pain   Leukocytosis, unspecified   Discharge Condition: improved  Diet recommendation: low salt  Filed Weights   09/01/13 1011 09/02/13 0654 09/03/13 0431  Weight: 128.368 kg (283 lb) 130.318 kg (287 lb 4.8 oz) 132.813 kg (292 lb 12.8 oz)    History of present illness:  [name] [name] is a 73 y.o. male history right-sided heart failure has had repeated admissions for volume overload. He was recently discharged on 08/24/13 after being treated for volume overload and acute on chronic diastolic congestive heart failure/right-sided heart failure. The patient was discharged on Demadex 40 mg by mouth twice a day. After returning home, as noted the patient was starting to gain weight and become more edematous. He followed up with his cardiologist on 4/7 and his diuretics were changed to Demadex 60 mg by mouth twice a day as well as metolazone 2.5 mg daily. The patient continued with his Demadex 40 mg twice a day to start taking metolazone 2.5 mg daily. He reports a significant weight loss over the past week approximately 10 pounds. He is feeling increasingly lightheaded dizzy, and generally weak. He occasionally feels short of breath. He does describes intermittent left upper quadrant/chest pain. This lasts for a few seconds to a few minutes. It is sharp in nature. Can occur on exertion as well as rest. He is feeling increasingly thirsty. His urine has been dark. No dysuria. No  vomiting or diarrhea. He was evaluated in the emergency room where potassium was slightly low at 2.4 and had evidence of dehydration. The patient is being admitted for further treatment.   Hospital Course:  This patient was admitted with dehydration, renal failure due to overdiuresis. He has known right-sided heart failure and his diuretics were recently been adjusted. His had several admissions for volume overload. He was recently seen by his cardiologist his diuretics were increased. Since that time he was having lightheadedness, generalized weakness. He was found to be significantly dehydrated on admission. He was started on intravenous fluids and since then, his renal function has improved. He is also clinically improved and no longer feels as lightheaded/fatigue. His case was discussed with Kerin Ransom at [facility] heart and vascular who has seen the patient in the past. Recommendations were to change from metolazone daily to twice a week. His Demadex will be continued at 40 mg twice a day. He's been advised to resume his diuretics on 4/15. He was followed his cardiologist, Dr. Debara Pickett on 4/17. He does have a home health RN to assist with his congestive heart failure. Patient is ready for discharge home.  Procedures:    Consultations:    Discharge Exam: Filed Vitals:   09/03/13 1500  BP: 116/64  Pulse: 77  Temp: 97.9 F (36.6 C)  Resp: 18    General: NAD Cardiovascular: S1, S2 RRR Respiratory: CTA B  Discharge Instructions You were cared for by a hospitalist during your hospital stay. If you have any questions about your discharge medications or the care you  received while you were in the hospital after you are discharged, you can call the unit and asked to speak with the hospitalist on call if the hospitalist that took care of you is not available. Once you are discharged, your primary care physician will handle any further medical issues. Please note that NO REFILLS for any  discharge medications will be authorized once you are discharged, as it is imperative that you return to your primary care physician (or establish a relationship with a primary care physician if you do not have one) for your aftercare needs so that they can reassess your need for medications and monitor your lab values.  Discharge Orders   Future Appointments Provider Department Dept Phone   09/06/2013 9:00 AM Pixie Casino, MD [facility] [phone]   11/12/2013 10:30 AM Philmore Pali, NP [facility] [phone]   07/17/2014 10:20 AM [place] [phone]   07/17/2014 10:30 AM Baird Cancer, PA-C [place] [phone]   Future Orders Complete By Expires   (HEART FAILURE PATIENTS) Call MD:  Anytime you have any of the following symptoms: 1) 3 pound weight gain in 24 hours or 5 pounds in 1 week 2) shortness of breath, with or without a dry hacking cough 3) swelling in the hands, feet or stomach 4) if you have to sleep on extra pillows at night in order to breathe.  As directed    Call MD for:  difficulty breathing, headache or visual disturbances  As directed    Diet - low sodium heart healthy  As directed    Increase activity slowly  As directed        Medication List         acetaminophen 325 MG tablet  Commonly known as:  TYLENOL  Take 325-650 mg by mouth every 6 (six) hours as needed for pain.     atorvastatin 80 MG tablet  Commonly known as:  LIPITOR  Take 80 mg by mouth at bedtime.     cetirizine 10 MG tablet  Commonly known as:  ZYRTEC  Take 10 mg by mouth daily.     cyclobenzaprine 10 MG tablet  Commonly known as:  FLEXERIL  Take 1 tablet (10 mg total) by mouth 3 (three) times daily as needed for muscle spasms.     docusate sodium 100 MG capsule  Commonly known as:  COLACE  Take 100 mg by mouth daily as needed for mild constipation.     latanoprost 0.005 % ophthalmic solution  Commonly known  as:  XALATAN  Place 1 drop into both eyes at bedtime.     LUBRICANT EYE DROPS 0.4-0.3 % Soln  Generic drug:  Polyethyl Glycol-Propyl Glycol  Apply 1 drop to eye daily as needed (for lubricant eye drops).     metolazone 2.5 MG tablet  Commonly known as:  ZAROXOLYN  Take 1 tablet by mouth once every 3 days 30 minutes prior to AM dose of torsemide.     metoprolol succinate 25 MG 24 hr tablet  Commonly known as:  TOPROL-XL  Take 1 tablet (25 mg total) by mouth every morning.     oxyCODONE-acetaminophen 10-325 MG per tablet  Commonly known as:  PERCOCET  Take 1 tablet by mouth every 6 (six) hours as needed for pain.     pantoprazole 40 MG tablet  Commonly known as:  PROTONIX  Take 40 mg by mouth 2 (two) times daily.  potassium chloride SA 20 MEQ tablet  Commonly known as:  K-DUR,KLOR-CON  Take 1 tablet (20 mEq total) by mouth 2 (two) times daily.     tamsulosin 0.4 MG Caps capsule  Commonly known as:  FLOMAX  Take 0.4 mg by mouth daily after supper.     timolol 0.5 % ophthalmic gel-forming  Commonly known as:  TIMOPTIC-XR  Place 1 drop into both eyes 2 (two) times daily.     torsemide 20 MG tablet  Commonly known as:  DEMADEX  Take 40 mg by mouth 2 (two) times daily.       Allergies  Allergen Reactions  . Aspirin Other (See Comments)    Nausea and upset stomach         Follow-up Information   Follow up with HILTY,Kenneth C, MD. (on friday as previously scheduled.  check blood test, kidney function, when you see Dr. Debara Pickett)    Specialty:  Cardiology   Contact information:   [place] [address] [phone]        The results of significant diagnostics from this hospitalization (including imaging, microbiology, ancillary and laboratory) are listed below for reference.    Significant Diagnostic Studies: Dg Chest 2 View  08/20/2013   CLINICAL DATA:  Fluid retention.  Weight gain.  Shortness of breath.  EXAM: CHEST  2 VIEW   COMPARISON:  Tear prior  FINDINGS: Low volume chest with basilar atelectasis. The cardiopericardial silhouette appears within normal limits for volumes of inspiration. No airspace disease/effusion. No focal consolidation to suggest pneumonia.  IMPRESSION: Low volume chest.  No gross acute cardiopulmonary disease.   Electronically Signed   By: Dereck Ligas M.D.   On: 08/20/2013 12:07   Dg Chest Portable 1 View  09/01/2013   CLINICAL DATA:  Chest pain.  EXAM: PORTABLE CHEST - 1 VIEW  COMPARISON:  August 20, 2013.  FINDINGS: The heart size and mediastinal contours are within normal limits. Both lungs are clear. No pneumothorax or pleural effusion is noted. The visualized skeletal structures are unremarkable.  IMPRESSION: No acute cardiopulmonary abnormality seen.   Electronically Signed   By: Sabino Dick M.D.   On: 09/01/2013 10:50    Microbiology: Recent Results (from the past 240 hour(s))  CULTURE, BLOOD (ROUTINE X 2)     Status: None   Collection Time    09/01/13 11:38 AM      Result Value Ref Range Status   Specimen Description BLOOD LEFT ANTECUBITAL   Final   Special Requests BOTTLES DRAWN AEROBIC AND ANAEROBIC 8CC   Final   Culture NO GROWTH 2 DAYS   Final   Report Status PENDING   Incomplete  CULTURE, BLOOD (ROUTINE X 2)     Status: None   Collection Time    09/01/13 11:38 AM      Result Value Ref Range Status   Specimen Description BLOOD LEFT ANTECUBITAL   Final   Special Requests BOTTLES DRAWN AEROBIC AND ANAEROBIC 8CC   Final   Culture NO GROWTH 2 DAYS   Final   Report Status PENDING   Incomplete     Labs: Basic Metabolic Panel:  Recent Labs Lab 08/30/13 0940 09/01/13 1037 09/01/13 1459 09/02/13 0436 09/03/13 0542  NA 136 135*  --  141 138  K 2.8* 2.4*  --  3.5* 3.8  CL 84* 81*  --  91* 95*  CO2 39* 41*  --  43* 38*  GLUCOSE 160* 170*  --  155*  155*  BUN 35* 44*  --  31* 20  CREATININE 1.39* 1.40*  --  1.20 1.02  CALCIUM 10.2 9.9  --  9.3 9.1  MG  --   --  2.5   --   --    Liver Function Tests:  Recent Labs Lab 09/01/13 1037  AST 22  ALT 21  ALKPHOS 110  BILITOT 1.7*  PROT 7.5  ALBUMIN 3.5   No results found for this basename: LIPASE, AMYLASE,  in the last 168 hours No results found for this basename: AMMONIA,  in the last 168 hours CBC:  Recent Labs Lab 09/01/13 1037 09/02/13 0436 09/03/13 0542  WBC 18.7* 15.5* 14.4*  NEUTROABS 15.6*  --   --   HGB 16.7 15.0 14.1  HCT 48.4 45.5 43.2  MCV 76.6* 78.4 79.3  PLT 152 136* 117*   Cardiac Enzymes:  Recent Labs Lab 09/01/13 1037 09/01/13 1625 09/01/13 2155 09/02/13 0436  TROPONINI <0.30 <0.30 <0.30 <0.30   BNP: BNP (last 3 results)  Recent Labs  08/20/13 1115 08/22/13 0503 09/01/13 1037  PROBNP 10.0 11.3 59.0   CBG: No results found for this basename: GLUCAP,  in the last 168 hours     Signed:  Kathie Dike  [facility] 09/03/2013, 9:38 PM

## 2013-09-04 DIAGNOSIS — I509 Heart failure, unspecified: Secondary | ICD-10-CM | POA: Diagnosis not present

## 2013-09-04 DIAGNOSIS — G4733 Obstructive sleep apnea (adult) (pediatric): Secondary | ICD-10-CM | POA: Diagnosis not present

## 2013-09-04 DIAGNOSIS — L97209 Non-pressure chronic ulcer of unspecified calf with unspecified severity: Secondary | ICD-10-CM | POA: Diagnosis not present

## 2013-09-04 DIAGNOSIS — L02419 Cutaneous abscess of limb, unspecified: Secondary | ICD-10-CM | POA: Diagnosis not present

## 2013-09-04 DIAGNOSIS — I2789 Other specified pulmonary heart diseases: Secondary | ICD-10-CM | POA: Diagnosis not present

## 2013-09-04 DIAGNOSIS — I872 Venous insufficiency (chronic) (peripheral): Secondary | ICD-10-CM | POA: Diagnosis not present

## 2013-09-05 DIAGNOSIS — I10 Essential (primary) hypertension: Secondary | ICD-10-CM | POA: Diagnosis not present

## 2013-09-05 DIAGNOSIS — I509 Heart failure, unspecified: Secondary | ICD-10-CM | POA: Diagnosis not present

## 2013-09-05 DIAGNOSIS — Z6841 Body Mass Index (BMI) 40.0 and over, adult: Secondary | ICD-10-CM | POA: Diagnosis not present

## 2013-09-06 ENCOUNTER — Ambulatory Visit: Payer: Medicare Other | Admitting: Internal Medicine

## 2013-09-06 ENCOUNTER — Ambulatory Visit (INDEPENDENT_AMBULATORY_CARE_PROVIDER_SITE_OTHER): Payer: Medicare Other | Admitting: Internal Medicine

## 2013-09-06 ENCOUNTER — Encounter: Payer: Self-pay | Admitting: Internal Medicine

## 2013-09-06 VITALS — BP 134/84 | HR 82 | Ht 69.0 in | Wt 292.0 lb

## 2013-09-06 DIAGNOSIS — H409 Unspecified glaucoma: Secondary | ICD-10-CM | POA: Diagnosis not present

## 2013-09-06 DIAGNOSIS — Z79899 Other long term (current) drug therapy: Secondary | ICD-10-CM

## 2013-09-06 DIAGNOSIS — I5033 Acute on chronic diastolic (congestive) heart failure: Secondary | ICD-10-CM | POA: Diagnosis not present

## 2013-09-06 DIAGNOSIS — I2510 Atherosclerotic heart disease of native coronary artery without angina pectoris: Secondary | ICD-10-CM | POA: Diagnosis not present

## 2013-09-06 DIAGNOSIS — E785 Hyperlipidemia, unspecified: Secondary | ICD-10-CM | POA: Diagnosis not present

## 2013-09-06 DIAGNOSIS — I10 Essential (primary) hypertension: Secondary | ICD-10-CM | POA: Diagnosis not present

## 2013-09-06 DIAGNOSIS — I50810 Right heart failure, unspecified: Secondary | ICD-10-CM

## 2013-09-06 DIAGNOSIS — H4011X0 Primary open-angle glaucoma, stage unspecified: Secondary | ICD-10-CM | POA: Diagnosis not present

## 2013-09-06 DIAGNOSIS — I509 Heart failure, unspecified: Secondary | ICD-10-CM

## 2013-09-06 LAB — CULTURE, BLOOD (ROUTINE X 2)
CULTURE: NO GROWTH
CULTURE: NO GROWTH

## 2013-09-06 MED ORDER — ONDANSETRON HCL 4 MG PO TABS: 4.0000 mg | ORAL_TABLET | Freq: Three times a day (TID) | ORAL | Status: DC | PRN

## 2013-09-06 NOTE — Progress Notes (Signed)
OFFICE NOTE  Chief Complaint:  Routine follow-up  Primary Care Physician: [name]., MD  HPI:  [name] [name] is a 73 year old gentleman with OSA, morbid obesity, probable right heart failure and long-standing lower extremity edema. He had a venous insufficiency study, which showed deep vein insufficiency, but no superficial reflux. We have discussed stocking use; however, he is unable to get them on. Therefore, we are managing with diuretics, currently on Lasix 80 mg twice daily. He has had some lower extremity swelling. However, overall his weight has decreased markedly, down to 282 from 301. This appears to be stable today. He recently had low back surgery and reports a marked improvement in his back pain.  During rehabilitation, however he reported an increase in right knee pain with some crepitus. He is scheduled to see Dr. Ace Gins for possible pain management (?injections). He continues to have mostly symptoms due to right heart failure, but they are only with marked exertion.   He was recently hospitalized at any and 4 acute on chronic diastolic heart failure. He had more than 10 pound weight gain. He was diureses with IV Lasix and switched to by mouth torsemide. He was on Lasix 80 mg 3 times a day prior to admission but was discharged on 40 mg of torsemide twice daily. This actually represents a decreased dose of diuretic overall. Since discharge, his weight has been steadily climbing and is now up about 7 pounds over discharge weight. He is reporting more abdominal swelling and lower extremity edema. He is more short of breath and he is somewhat orthopneic.  At his last office visit, I recommended increasing Mr. Timbrook torsemide to 60 mg twice daily. Added metolazone 2.5 mg. Unfortunately had marked diuresis. He lost approximately 10 pounds over 4 days and became somewhat weak. He presented to the emergency room and was found to be mildly dehydrated and hypokalemic. He was given  some fluid back and electrolytes were corrected. His diuretics were decreased back to 40 mg of torsemide twice daily and it was recommended that he take a metolazone 2.5 mg twice weekly. Since discharge his weight has stayed fairly stable at 292 pounds.   PMHx:  Past Medical History  Diagnosis Date  . CHF (congestive heart failure)   . Obesity   . Hyperlipidemia   . PUD (peptic ulcer disease)   . GERD (gastroesophageal reflux disease)   . S/P endoscopy 2007, 2012    2007: nl, May 2012: antral erosions  . Osteoarthritis   . S/P colonoscopy 2007, Feb and March 2012    hx of adenomas, left-sided diverticula, On 07/2010 TCS, fresh blood and clot noted coming from TI.  Marland Kitchen Anxiety   . Hypothyroidism     hypothyroid  . Ringing in ears   . History of kidney stones   . History of bladder infections   . Heel spur     right heel  . Collagen vascular disease     history of venous insufficency and LE cellulitis  . Thrombocytopenia 12/30/2011    Stable  . BPH (benign prostatic hyperplasia)   . Hypertension     Sees Dr. Debara Pickett  . Bilateral lower leg cellulitis   . Sleep apnea     uses oxygen 2L via [place] cannot tolerate CPAP - sleep study 2008 AHI 48.80/hr and 56.70hr during REM  . Complication of anesthesia     due to sleep apnea pt has had difficulty being put under anesthesia  . PONV (postoperative nausea and vomiting)   .  Deep venous insufficiency   . Chronic diastolic HF (heart failure)     Past Surgical History  Procedure Laterality Date  . Knee arthroscopy    . Rotator cuff repair    . Cholecystectomy    . Lumbar spine surgery    . Small bowel capsule study  07/2011    ?transient focal ischemia in distal small bowel to explain GI bleeding  . Esophagogastroduodenoscopy  Aug 2013    [facility], single 68mm pedunculated polyp otherwise normal. HYPERPLASTIC, NEGATIVE h.pylori  . Cardiac catheterization  02/27/2007    no sign of CAD, LVH, mod pulm HTN (Dr. Jackie Plum(  . Transurethral resection  of prostate    . Transurethral resection of prostate N/A 06/18/2013    Procedure: TRANSURETHRAL RESECTION OF THE PROSTATE (TURP);  Surgeon: Marissa Nestle, MD;  Location: [facility];  Service: Urology;  Laterality: N/A;  . Transthoracic echocardiogram  09/2008    EF 60-65%, mod conc LVH  . Nm myocar perf wall motion  12/2006    dipyridamole myoview - stress images show mild perfusion defect in mid inferior walls with reversibility at rest; mild perfusion defect in mid inferolateral wall at stress with mild defect reversibility, EF 62%, abnormal but low risk study     FAMHx:  Family History  Problem Relation Age of Onset  . Colon cancer Father 42    deceased  . Prostate cancer Father   . Pancreatic cancer Mother 19    deceased    SOCHx:   reports that he quit smoking about 40 years ago. His smoking use included Cigars. He quit smokeless tobacco use about 40 years ago. He reports that he does not drink alcohol or use illicit drugs.  ALLERGIES:  Allergies  Allergen Reactions  . Aspirin Other (See Comments)    Nausea and upset stomach      ROS: A comprehensive review of systems was negative except for: Respiratory: positive for dyspnea on exertion Cardiovascular: positive for lower extremity edema and orthopnea Musculoskeletal: positive for back pain  HOME MEDS: Current Outpatient Prescriptions  Medication Sig Dispense Refill  . acetaminophen (TYLENOL) 325 MG tablet Take 325-650 mg by mouth every 6 (six) hours as needed for pain.       Marland Kitchen atorvastatin (LIPITOR) 80 MG tablet Take 80 mg by mouth at bedtime.      . cetirizine (ZYRTEC) 10 MG tablet Take 10 mg by mouth daily.      . cyclobenzaprine (FLEXERIL) 10 MG tablet Take 1 tablet (10 mg total) by mouth 3 (three) times daily as needed for muscle spasms.  45 tablet  0  . docusate sodium (COLACE) 100 MG capsule Take 100 mg by mouth daily as needed for mild constipation.       Marland Kitchen latanoprost (XALATAN) 0.005 % ophthalmic solution Place 1  drop into both eyes at bedtime.      . metolazone (ZAROXOLYN) 2.5 MG tablet Take 1 tablet by twice weekly (on Wednesday & Saturday) 30 minutes prior to AM dose of torsemide.      . metoprolol succinate (TOPROL-XL) 25 MG 24 hr tablet Take 1 tablet (25 mg total) by mouth every morning.  30 tablet  11  . ondansetron (ZOFRAN) 4 MG tablet Take 1 tablet (4 mg total) by mouth every 8 (eight) hours as needed for nausea or vomiting.  30 tablet  1  . oxyCODONE-acetaminophen (PERCOCET) 10-325 MG per tablet Take 1 tablet by mouth every 6 (six) hours as needed for pain.  90 tablet  0  . pantoprazole (PROTONIX) 40 MG tablet Take 40 mg by mouth 2 (two) times daily.      Vladimir Faster Glycol-Propyl Glycol (LUBRICANT EYE DROPS) 0.4-0.3 % SOLN Apply 1 drop to eye daily as needed (for lubricant eye drops).      . potassium chloride SA (K-DUR,KLOR-CON) 20 MEQ tablet Take 1 tablet (20 mEq total) by mouth 2 (two) times daily.  60 tablet  0  . Tamsulosin HCl (FLOMAX) 0.4 MG CAPS Take 0.4 mg by mouth daily after supper.       . timolol (TIMOPTIC-XR) 0.5 % ophthalmic gel-forming Place 1 drop into both eyes 2 (two) times daily.      Marland Kitchen torsemide (DEMADEX) 20 MG tablet Take 40 mg by mouth 2 (two) times daily.        No current facility-administered medications for this visit.    LABS/IMAGING: No results found for this or any previous visit (from the past 48 hour(s)). No results found.  VITALS: BP 134/84  Pulse 82  Ht 5\' 9"  (1.753 m)  Wt 292 lb (132.45 kg)  BMI 43.10 kg/m2  EXAM: General appearance: alert, no distress and morbidly obese Neck: no adenopathy, no carotid bruit, no JVD, supple, symmetrical, trachea midline and thyroid not enlarged, symmetric, no tenderness/mass/nodules Lungs: clear to auscultation bilaterally Heart: regular rate and rhythm, S1, S2 normal, no murmur, click, rub or gallop Abdomen: morbidly obese, distended Extremities: edema 2 Pulses: 2+ and symmetric Skin: Skin color, texture, turgor  normal. No rashes or lesions Neurologic: Grossly normal  EKG: deferred  ASSESSMENT: 1. Acute on chronic diastolic congestive heart failure 2. Morbid obesity 3. Obstructive sleep apnea intolerant to CPAP, on home oxygen at night 4. Symptoms of right heart failure 5. Low back pain status post recent back surgery  PLAN: 1.   Mr. Amend feels less short of breath and weak after his hospitalization but still is reporting morning nausea. He is requesting medication for this. It is not clear what his nausea is related to, but possibly heart failure. He does have predominant right heart failure symptoms. I'm hesitant to increase his diuretics D. to the narrow window between heart failure and dehydration. I would recommend that he stays on his 40 mg twice daily torsemide dose with metolazone 2.5 mg on Saturday and Wednesday.  We did discuss with his decreasing vision and other medical problems about him possibly transferring his care to [facility] to see Dr. Harl Bowie.  He had a positive experience with his recent consultation. While I enjoyed seeing Mr. Cocke, I feel that he may do better off with the ability to have more frequent visits in [place].   Pixie Casino, MD, [facility] Attending Cardiologist [facility] 09/06/2013, 6:21 PM

## 2013-09-06 NOTE — Patient Instructions (Addendum)
Please have labs on Wednesday (BMP)  Take metolazone only on Wednesday & Saturday  Please follow up with Dr. Harl Bowie in [place] in 2 months.

## 2013-09-09 DIAGNOSIS — I2789 Other specified pulmonary heart diseases: Secondary | ICD-10-CM | POA: Diagnosis not present

## 2013-09-09 DIAGNOSIS — I872 Venous insufficiency (chronic) (peripheral): Secondary | ICD-10-CM | POA: Diagnosis not present

## 2013-09-09 DIAGNOSIS — I509 Heart failure, unspecified: Secondary | ICD-10-CM | POA: Diagnosis not present

## 2013-09-09 DIAGNOSIS — L02419 Cutaneous abscess of limb, unspecified: Secondary | ICD-10-CM | POA: Diagnosis not present

## 2013-09-09 DIAGNOSIS — L97209 Non-pressure chronic ulcer of unspecified calf with unspecified severity: Secondary | ICD-10-CM | POA: Diagnosis not present

## 2013-09-09 DIAGNOSIS — G4733 Obstructive sleep apnea (adult) (pediatric): Secondary | ICD-10-CM | POA: Diagnosis not present

## 2013-09-11 ENCOUNTER — Telehealth: Payer: Self-pay | Admitting: Internal Medicine

## 2013-09-11 DIAGNOSIS — I509 Heart failure, unspecified: Secondary | ICD-10-CM | POA: Diagnosis not present

## 2013-09-11 DIAGNOSIS — G4733 Obstructive sleep apnea (adult) (pediatric): Secondary | ICD-10-CM | POA: Diagnosis not present

## 2013-09-11 DIAGNOSIS — L02419 Cutaneous abscess of limb, unspecified: Secondary | ICD-10-CM | POA: Diagnosis not present

## 2013-09-11 DIAGNOSIS — I2789 Other specified pulmonary heart diseases: Secondary | ICD-10-CM | POA: Diagnosis not present

## 2013-09-11 DIAGNOSIS — I872 Venous insufficiency (chronic) (peripheral): Secondary | ICD-10-CM | POA: Diagnosis not present

## 2013-09-11 DIAGNOSIS — L03119 Cellulitis of unspecified part of limb: Secondary | ICD-10-CM | POA: Diagnosis not present

## 2013-09-11 DIAGNOSIS — L97209 Non-pressure chronic ulcer of unspecified calf with unspecified severity: Secondary | ICD-10-CM | POA: Diagnosis not present

## 2013-09-11 DIAGNOSIS — Z0189 Encounter for other specified special examinations: Secondary | ICD-10-CM | POA: Diagnosis not present

## 2013-09-11 NOTE — Telephone Encounter (Signed)
Juliann Pulse with [facility] called about labs the patient needs and patient also tells her Dr. Debara Pickett is transferring care to Dr. Harl Bowie in [place].  Needs BMP today- she will draw during visit.  Dr. Nelly Laurence # given to Juliann Pulse and she will assist patient with making an appt for follow-up.

## 2013-09-12 ENCOUNTER — Telehealth: Payer: Self-pay | Admitting: Internal Medicine

## 2013-09-12 DIAGNOSIS — M545 Low back pain, unspecified: Secondary | ICD-10-CM | POA: Diagnosis not present

## 2013-09-12 DIAGNOSIS — M79609 Pain in unspecified limb: Secondary | ICD-10-CM | POA: Diagnosis not present

## 2013-09-12 DIAGNOSIS — G894 Chronic pain syndrome: Secondary | ICD-10-CM | POA: Diagnosis not present

## 2013-09-12 DIAGNOSIS — M47817 Spondylosis without myelopathy or radiculopathy, lumbosacral region: Secondary | ICD-10-CM | POA: Diagnosis not present

## 2013-09-12 DIAGNOSIS — E876 Hypokalemia: Secondary | ICD-10-CM

## 2013-09-12 NOTE — Telephone Encounter (Signed)
[name] [name] w/ [facility].  K+ 2.2  Taking K+ 20 meQ BID.  Call to pt to confirm pt is taking K+ 20 mEq BID or if he missed any doses.  No answer.  Message forwarded to B. Samara Snide, PA-C for further instructions.

## 2013-09-12 NOTE — Telephone Encounter (Signed)
Hypokalemia:  Increase potassium to 40 meq TID and recheck potassium tomorrow.  I wrote the lab order.  Tarri Fuller [facility]

## 2013-09-12 NOTE — Telephone Encounter (Signed)
Call to [facility] and informed.  Verbalized understanding and stated pt's CO2 was 35 and Chloride was low.  Tarri Fuller, PA-C notified and advised continue current plan for now, making sure pt has at least 2 doses of 40 mEq K+ before labs rechecked.  [facility] notified and advised to order labs STAT so they will have a result by end of business tomorrow and possibly have results faxed to office.  Verbalized understanding and agreed w/ plan.

## 2013-09-13 ENCOUNTER — Telehealth: Payer: Self-pay | Admitting: *Deleted

## 2013-09-13 DIAGNOSIS — G4733 Obstructive sleep apnea (adult) (pediatric): Secondary | ICD-10-CM | POA: Diagnosis not present

## 2013-09-13 DIAGNOSIS — I509 Heart failure, unspecified: Secondary | ICD-10-CM | POA: Diagnosis not present

## 2013-09-13 DIAGNOSIS — L97209 Non-pressure chronic ulcer of unspecified calf with unspecified severity: Secondary | ICD-10-CM | POA: Diagnosis not present

## 2013-09-13 DIAGNOSIS — L02419 Cutaneous abscess of limb, unspecified: Secondary | ICD-10-CM | POA: Diagnosis not present

## 2013-09-13 DIAGNOSIS — I872 Venous insufficiency (chronic) (peripheral): Secondary | ICD-10-CM | POA: Diagnosis not present

## 2013-09-13 DIAGNOSIS — I2789 Other specified pulmonary heart diseases: Secondary | ICD-10-CM | POA: Diagnosis not present

## 2013-09-13 NOTE — Telephone Encounter (Signed)
Patient informed of low potassium level. Advised him per Dr. Ellyn Hack of the following med changes -stop the zaroxylyn. Hold  the torsemide this PM, tomorrow, then take 20mg  daily starting on Sunday. Take k-dur 40 mEq twice a day Saturday and Sunday . Drink(( 2) 20 oz bottles of gatorade on Saturday and Sunday only. Recheck BMET  On Monday.

## 2013-09-16 DIAGNOSIS — I872 Venous insufficiency (chronic) (peripheral): Secondary | ICD-10-CM | POA: Diagnosis not present

## 2013-09-16 DIAGNOSIS — I509 Heart failure, unspecified: Secondary | ICD-10-CM | POA: Diagnosis not present

## 2013-09-16 DIAGNOSIS — L02419 Cutaneous abscess of limb, unspecified: Secondary | ICD-10-CM | POA: Diagnosis not present

## 2013-09-16 DIAGNOSIS — I2789 Other specified pulmonary heart diseases: Secondary | ICD-10-CM | POA: Diagnosis not present

## 2013-09-16 DIAGNOSIS — G4733 Obstructive sleep apnea (adult) (pediatric): Secondary | ICD-10-CM | POA: Diagnosis not present

## 2013-09-16 DIAGNOSIS — L97209 Non-pressure chronic ulcer of unspecified calf with unspecified severity: Secondary | ICD-10-CM | POA: Diagnosis not present

## 2013-09-17 ENCOUNTER — Telehealth: Payer: Self-pay | Admitting: Internal Medicine

## 2013-09-17 DIAGNOSIS — L97209 Non-pressure chronic ulcer of unspecified calf with unspecified severity: Secondary | ICD-10-CM | POA: Diagnosis not present

## 2013-09-17 DIAGNOSIS — I509 Heart failure, unspecified: Secondary | ICD-10-CM | POA: Diagnosis not present

## 2013-09-17 DIAGNOSIS — I872 Venous insufficiency (chronic) (peripheral): Secondary | ICD-10-CM | POA: Diagnosis not present

## 2013-09-17 DIAGNOSIS — G4733 Obstructive sleep apnea (adult) (pediatric): Secondary | ICD-10-CM | POA: Diagnosis not present

## 2013-09-17 DIAGNOSIS — I2789 Other specified pulmonary heart diseases: Secondary | ICD-10-CM | POA: Diagnosis not present

## 2013-09-17 DIAGNOSIS — L02419 Cutaneous abscess of limb, unspecified: Secondary | ICD-10-CM | POA: Diagnosis not present

## 2013-09-17 NOTE — Telephone Encounter (Signed)
RN received another call from [facility]. Reports that patient's K 3.8, sodium chloride 88, CO2 36. Of note, patient is scheduled to see Dr. Harl Bowie. Juliann Pulse was inquiring of potassium dose, wondering if shoulder be kept at 6mEq BID since K is WNL. reiterated to Juliann Pulse the medications instructions documented on 4/24 r/t diuretics. Will defer to Dr. Debara Pickett to see if meds should be changed or wait for eval with Dr. Harl Bowie on Thursday

## 2013-09-17 NOTE — Telephone Encounter (Signed)
Anderson Malta called in regarding to patient. Stated has gained 6lbs in 3 days, his BP and HR are both elevated. Patient denies SOB presently but is c/o swelling. Reports to Anderson Malta he was told to stop all diuretics, but on 4/24 he was instructed to stop metolazone and hold torsemide x 3 doses and then start taking 20mg  QD on 4/26. Anderson Malta inquired when patient's next appointment was and informed her that patient has requested to be seen at [facility] office and doesn't have appointment with Dr. Zandra Abts until June, but Anderson Malta will call to see if he can get one sooner. Also, labs were to be drawn yesterday 4/27, and Anderson Malta will see about getting those sent to our office. Will defer message to Dr. Debara Pickett and B. Rosita Fire, PA-C in office today as FYI/for advice as needed.

## 2013-09-18 ENCOUNTER — Encounter: Payer: Self-pay | Admitting: Internal Medicine

## 2013-09-18 NOTE — Telephone Encounter (Signed)
No change in meds .Marland Kitchen Keep appointment with Dr. Harl Bowie Thursday.  Dr. Lemmie Evens

## 2013-09-18 NOTE — Telephone Encounter (Signed)
LM with Anderson Malta with advice.

## 2013-09-19 ENCOUNTER — Encounter: Payer: Self-pay | Admitting: Cardiology

## 2013-09-19 ENCOUNTER — Telehealth: Payer: Self-pay | Admitting: Cardiology

## 2013-09-19 ENCOUNTER — Telehealth: Payer: Self-pay

## 2013-09-19 ENCOUNTER — Ambulatory Visit (INDEPENDENT_AMBULATORY_CARE_PROVIDER_SITE_OTHER): Payer: Medicare Other | Admitting: Cardiology

## 2013-09-19 VITALS — BP 142/64 | HR 91 | Ht 66.0 in | Wt 297.0 lb

## 2013-09-19 DIAGNOSIS — I5032 Chronic diastolic (congestive) heart failure: Secondary | ICD-10-CM

## 2013-09-19 DIAGNOSIS — G4733 Obstructive sleep apnea (adult) (pediatric): Secondary | ICD-10-CM

## 2013-09-19 DIAGNOSIS — I2510 Atherosclerotic heart disease of native coronary artery without angina pectoris: Secondary | ICD-10-CM | POA: Diagnosis not present

## 2013-09-19 DIAGNOSIS — Q5564 Hidden penis: Secondary | ICD-10-CM | POA: Diagnosis not present

## 2013-09-19 DIAGNOSIS — R350 Frequency of micturition: Secondary | ICD-10-CM | POA: Diagnosis not present

## 2013-09-19 NOTE — Patient Instructions (Signed)
Your physician recommends that you schedule a follow-up appointment in: 8 weeks with Dr.Branch   Your physician recommends that you continue on your current medications as directed. Please refer to the Current Medication list given to you today.    Thank you for choosing [facility] !

## 2013-09-19 NOTE — Telephone Encounter (Signed)
[facility] states that patient has had 7 lb weight gain.  Please call her back at above number. / tgs

## 2013-09-19 NOTE — Progress Notes (Signed)
Clinical Summary [name] [name] is a 73 y.o.male seen today for follow up of the following medical problems.  1. Non-obstructive CAD - mild disease by cath in 2008 - denies any chest pain  2. OSA - unable to tolerate CPAP prevoiusly - recent sleep study 07/2013 with severe sleep apnea, he has not been refitted for CPAP yet  3. Chronic diastolic heart failure - echo 04/2013 LVEF 63-87%, grade I diastolic dysfunction. RV was poorly visualized.  - recent admission for volume overload, likely secondary to dietary indiscrestion. He was diuresed and discharged home - chronic LE edema with venous insufficiency, has been unable to tolerate stockings in the past - admit 4/14 with dehydration, his diuretics had been increased to torsemide 60mg  bid and metolazone 2.5 mg daily. Metolazone was decreased to twice a week, demadex changed to 40mg  bid at that time.  - currently on torsemide 20mg  bid, off metolazone.   - weighing daily, typically ranges from 285-290 at home.  - stable DOE. Sleeps on mild angle with hospital bed.  - limiting sodium intake.   Past Medical History  Diagnosis Date  . CHF (congestive heart failure)   . Obesity   . Hyperlipidemia   . PUD (peptic ulcer disease)   . GERD (gastroesophageal reflux disease)   . S/P endoscopy 2007, 2012    2007: nl, May 2012: antral erosions  . Osteoarthritis   . S/P colonoscopy 2007, Feb and March 2012    hx of adenomas, left-sided diverticula, On 07/2010 TCS, fresh blood and clot noted coming from TI.  Marland Kitchen Anxiety   . Hypothyroidism     hypothyroid  . Ringing in ears   . History of kidney stones   . History of bladder infections   . Heel spur     right heel  . Collagen vascular disease     history of venous insufficency and LE cellulitis  . Thrombocytopenia 12/30/2011    Stable  . BPH (benign prostatic hyperplasia)   . Hypertension     Sees Dr. Debara Pickett  . Bilateral lower leg cellulitis   . Sleep apnea     uses oxygen 2L via [place]  cannot tolerate CPAP - sleep study 2008 [facility] 48.80/hr and 56.70hr during REM  . Complication of anesthesia     due to sleep apnea pt has had difficulty being put under anesthesia  . PONV (postoperative nausea and vomiting)   . Deep venous insufficiency   . Chronic diastolic HF (heart failure)      Allergies  Allergen Reactions  . Aspirin Other (See Comments)    Nausea and upset stomach       Current Outpatient Prescriptions  Medication Sig Dispense Refill  . acetaminophen (TYLENOL) 325 MG tablet Take 325-650 mg by mouth every 6 (six) hours as needed for pain.       Marland Kitchen atorvastatin (LIPITOR) 80 MG tablet Take 80 mg by mouth at bedtime.      . cetirizine (ZYRTEC) 10 MG tablet Take 10 mg by mouth daily.      . cyclobenzaprine (FLEXERIL) 10 MG tablet Take 1 tablet (10 mg total) by mouth 3 (three) times daily as needed for muscle spasms.  45 tablet  0  . docusate sodium (COLACE) 100 MG capsule Take 100 mg by mouth daily as needed for mild constipation.       Marland Kitchen latanoprost (XALATAN) 0.005 % ophthalmic solution Place 1 drop into both eyes at bedtime.      Marland Kitchen  metolazone (ZAROXOLYN) 2.5 MG tablet Take 1 tablet by twice weekly (on Wednesday & Saturday) 30 minutes prior to AM dose of torsemide.      . metoprolol succinate (TOPROL-XL) 25 MG 24 hr tablet Take 1 tablet (25 mg total) by mouth every morning.  30 tablet  11  . ondansetron (ZOFRAN) 4 MG tablet Take 1 tablet (4 mg total) by mouth every 8 (eight) hours as needed for nausea or vomiting.  30 tablet  1  . oxyCODONE-acetaminophen (PERCOCET) 10-325 MG per tablet Take 1 tablet by mouth every 6 (six) hours as needed for pain.  90 tablet  0  . pantoprazole (PROTONIX) 40 MG tablet Take 40 mg by mouth 2 (two) times daily.      [name] [name] Glycol-Propyl Glycol (LUBRICANT EYE DROPS) 0.4-0.3 % SOLN Apply 1 drop to eye daily as needed (for lubricant eye drops).      . potassium chloride SA (K-DUR,KLOR-CON) 20 MEQ tablet Take 1 tablet (20 mEq total) by  mouth 2 (two) times daily.  60 tablet  0  . Tamsulosin HCl (FLOMAX) 0.4 MG CAPS Take 0.4 mg by mouth daily after supper.       . timolol (TIMOPTIC-XR) 0.5 % ophthalmic gel-forming Place 1 drop into both eyes 2 (two) times daily.      Marland Kitchen torsemide (DEMADEX) 20 MG tablet Take 40 mg by mouth 2 (two) times daily.        No current facility-administered medications for this visit.     Past Surgical History  Procedure Laterality Date  . Knee arthroscopy    . Rotator cuff repair    . Cholecystectomy    . Lumbar spine surgery    . Small bowel capsule study  07/2011    ?transient focal ischemia in distal small bowel to explain GI bleeding  . Esophagogastroduodenoscopy  Aug 2013    [facility], single 22mm pedunculated polyp otherwise normal. HYPERPLASTIC, NEGATIVE h.pylori  . Cardiac catheterization  02/27/2007    no sign of CAD, LVH, mod pulm HTN (Dr. Jackie Plum(  . Transurethral resection of prostate    . Transurethral resection of prostate N/A 06/18/2013    Procedure: TRANSURETHRAL RESECTION OF THE PROSTATE (TURP);  Surgeon: Marissa Nestle, MD;  Location: [facility];  Service: Urology;  Laterality: N/A;  . Transthoracic echocardiogram  09/2008    EF 60-65%, mod conc LVH  . Nm myocar perf wall motion  12/2006    dipyridamole myoview - stress images show mild perfusion defect in mid inferior walls with reversibility at rest; mild perfusion defect in mid inferolateral wall at stress with mild defect reversibility, EF 62%, abnormal but low risk study      Allergies  Allergen Reactions  . Aspirin Other (See Comments)    Nausea and upset stomach        Family History  Problem Relation Age of Onset  . Colon cancer Father 29    deceased  . Prostate cancer Father   . Pancreatic cancer Mother 41    deceased     Social History [name] [name] reports that he quit smoking about 40 years ago. His smoking use included Cigars. He quit smokeless tobacco use about 40 years ago. [name] [name] reports that he  does not drink alcohol.   Review of Systems CONSTITUTIONAL: No weight loss, fever, chills, weakness or fatigue.  HEENT: Eyes: No visual loss, blurred vision, double vision or yellow sclerae.No hearing loss, sneezing, congestion, runny nose or sore throat.  SKIN: No  rash or itching.  CARDIOVASCULAR: per HPI RESPIRATORY: No shortness of breath, cough or sputum.  GASTROINTESTINAL: No anorexia, nausea, vomiting or diarrhea. No abdominal pain or blood.  GENITOURINARY: No burning on urination, no polyuria NEUROLOGICAL: No headache, dizziness, syncope, paralysis, ataxia, numbness or tingling in the extremities. No change in bowel or bladder control.  MUSCULOSKELETAL: leg swelling LYMPHATICS: No enlarged nodes. No history of splenectomy.  PSYCHIATRIC: No history of depression or anxiety.  ENDOCRINOLOGIC: No reports of sweating, cold or heat intolerance. No polyuria or polydipsia.  Marland Kitchen   Physical Examination p 91 bp 142/64 Wt 297 lbs BMI 48 Gen: resting comfortably, no acute distress HEENT: no scleral icterus, pupils equal round and reactive, no palptable cervical adenopathy,  CV: RRR, no m/r/g, no JVD Resp: Clear to auscultation bilaterally GI: abdomen is soft, non-tender, non-distended, normal bowel sounds, no hepatosplenomegaly MSK: extremities are warm, 1+ bilateral edema Skin: warm, no rash Neuro:  no focal deficits Psych: appropriate affect    Assessment and Plan  1. Non-obstructive CAD - no current symptoms, continue risk factor modifications  2. OSA - recent study with severe sleep apnea, he reports he has not been fitted for CPAP yet. We will get in touch with the [facility]  3. Chronic diastolic heart failure - labile volume status over the last few months, with admissions for volume overload and dehydration - reports he is weighing himself daily, typically around 285-290. Counseled when 290 or high to take torsemide 40mg  bid, when less 290 take 20mg  bid -  continue KCl replacement at 19mEq bid   F/u 8 weeks   [name] [name], M.D., F.A.C.C.

## 2013-09-19 NOTE — Telephone Encounter (Signed)
Faxed to [facility] at [phone] referral placed for CPAP Titration  I faxed MD prescription,office note,sleep study results, and demographics   Patient is already followed by [facility] [phone]

## 2013-09-19 NOTE — Telephone Encounter (Signed)
Received call from [facility] that pt had gained 7 lbs this past week per tele monitoring done in pts home by [facility].Patient had self stopped diuretics . She is aware that patient was seen earlier today by Dr.Branch. I told her I would relay this information to Dr.Branch which I did and he is aware of situation.  Pt needs CPAP titration with [facility]. I will call pts care manager Lucky Chrismon regarding this [phone] x [phone]

## 2013-09-20 ENCOUNTER — Telehealth: Payer: Self-pay | Admitting: *Deleted

## 2013-09-20 DIAGNOSIS — L97209 Non-pressure chronic ulcer of unspecified calf with unspecified severity: Secondary | ICD-10-CM | POA: Diagnosis not present

## 2013-09-20 DIAGNOSIS — I2789 Other specified pulmonary heart diseases: Secondary | ICD-10-CM | POA: Diagnosis not present

## 2013-09-20 DIAGNOSIS — I872 Venous insufficiency (chronic) (peripheral): Secondary | ICD-10-CM | POA: Diagnosis not present

## 2013-09-20 DIAGNOSIS — L03119 Cellulitis of unspecified part of limb: Secondary | ICD-10-CM | POA: Diagnosis not present

## 2013-09-20 DIAGNOSIS — L02419 Cutaneous abscess of limb, unspecified: Secondary | ICD-10-CM | POA: Diagnosis not present

## 2013-09-20 DIAGNOSIS — I509 Heart failure, unspecified: Secondary | ICD-10-CM | POA: Diagnosis not present

## 2013-09-20 DIAGNOSIS — G4733 Obstructive sleep apnea (adult) (pediatric): Secondary | ICD-10-CM | POA: Diagnosis not present

## 2013-09-20 NOTE — Telephone Encounter (Signed)
[facility]

## 2013-09-23 DIAGNOSIS — I872 Venous insufficiency (chronic) (peripheral): Secondary | ICD-10-CM | POA: Diagnosis not present

## 2013-09-23 DIAGNOSIS — I509 Heart failure, unspecified: Secondary | ICD-10-CM | POA: Diagnosis not present

## 2013-09-23 DIAGNOSIS — I2789 Other specified pulmonary heart diseases: Secondary | ICD-10-CM | POA: Diagnosis not present

## 2013-09-23 DIAGNOSIS — G4733 Obstructive sleep apnea (adult) (pediatric): Secondary | ICD-10-CM | POA: Diagnosis not present

## 2013-09-23 DIAGNOSIS — L03119 Cellulitis of unspecified part of limb: Secondary | ICD-10-CM | POA: Diagnosis not present

## 2013-09-23 DIAGNOSIS — L02419 Cutaneous abscess of limb, unspecified: Secondary | ICD-10-CM | POA: Diagnosis not present

## 2013-09-23 DIAGNOSIS — L97209 Non-pressure chronic ulcer of unspecified calf with unspecified severity: Secondary | ICD-10-CM | POA: Diagnosis not present

## 2013-09-24 DIAGNOSIS — I10 Essential (primary) hypertension: Secondary | ICD-10-CM | POA: Diagnosis not present

## 2013-09-24 DIAGNOSIS — I509 Heart failure, unspecified: Secondary | ICD-10-CM | POA: Diagnosis not present

## 2013-09-24 DIAGNOSIS — I5033 Acute on chronic diastolic (congestive) heart failure: Secondary | ICD-10-CM | POA: Diagnosis not present

## 2013-09-24 DIAGNOSIS — Z9981 Dependence on supplemental oxygen: Secondary | ICD-10-CM | POA: Diagnosis not present

## 2013-09-24 DIAGNOSIS — I2789 Other specified pulmonary heart diseases: Secondary | ICD-10-CM | POA: Diagnosis not present

## 2013-09-24 DIAGNOSIS — I872 Venous insufficiency (chronic) (peripheral): Secondary | ICD-10-CM | POA: Diagnosis not present

## 2013-09-24 DIAGNOSIS — G4733 Obstructive sleep apnea (adult) (pediatric): Secondary | ICD-10-CM | POA: Diagnosis not present

## 2013-09-26 DIAGNOSIS — IMO0002 Reserved for concepts with insufficient information to code with codable children: Secondary | ICD-10-CM | POA: Diagnosis not present

## 2013-09-26 DIAGNOSIS — M545 Low back pain, unspecified: Secondary | ICD-10-CM | POA: Diagnosis not present

## 2013-09-26 DIAGNOSIS — G894 Chronic pain syndrome: Secondary | ICD-10-CM | POA: Diagnosis not present

## 2013-09-26 DIAGNOSIS — IMO0001 Reserved for inherently not codable concepts without codable children: Secondary | ICD-10-CM | POA: Diagnosis not present

## 2013-09-26 DIAGNOSIS — M79609 Pain in unspecified limb: Secondary | ICD-10-CM | POA: Diagnosis not present

## 2013-09-26 DIAGNOSIS — M47817 Spondylosis without myelopathy or radiculopathy, lumbosacral region: Secondary | ICD-10-CM | POA: Diagnosis not present

## 2013-09-27 DIAGNOSIS — I2789 Other specified pulmonary heart diseases: Secondary | ICD-10-CM | POA: Diagnosis not present

## 2013-09-27 DIAGNOSIS — G4733 Obstructive sleep apnea (adult) (pediatric): Secondary | ICD-10-CM | POA: Diagnosis not present

## 2013-09-27 DIAGNOSIS — I5033 Acute on chronic diastolic (congestive) heart failure: Secondary | ICD-10-CM | POA: Diagnosis not present

## 2013-09-27 DIAGNOSIS — H4011X0 Primary open-angle glaucoma, stage unspecified: Secondary | ICD-10-CM | POA: Diagnosis not present

## 2013-09-27 DIAGNOSIS — I872 Venous insufficiency (chronic) (peripheral): Secondary | ICD-10-CM | POA: Diagnosis not present

## 2013-09-27 DIAGNOSIS — H409 Unspecified glaucoma: Secondary | ICD-10-CM | POA: Diagnosis not present

## 2013-09-27 DIAGNOSIS — I10 Essential (primary) hypertension: Secondary | ICD-10-CM | POA: Diagnosis not present

## 2013-09-27 DIAGNOSIS — H2510 Age-related nuclear cataract, unspecified eye: Secondary | ICD-10-CM | POA: Diagnosis not present

## 2013-09-27 DIAGNOSIS — I509 Heart failure, unspecified: Secondary | ICD-10-CM | POA: Diagnosis not present

## 2013-09-30 ENCOUNTER — Telehealth: Payer: Self-pay

## 2013-09-30 DIAGNOSIS — I10 Essential (primary) hypertension: Secondary | ICD-10-CM | POA: Diagnosis not present

## 2013-09-30 DIAGNOSIS — G4733 Obstructive sleep apnea (adult) (pediatric): Secondary | ICD-10-CM | POA: Diagnosis not present

## 2013-09-30 DIAGNOSIS — I2789 Other specified pulmonary heart diseases: Secondary | ICD-10-CM | POA: Diagnosis not present

## 2013-09-30 DIAGNOSIS — I872 Venous insufficiency (chronic) (peripheral): Secondary | ICD-10-CM | POA: Diagnosis not present

## 2013-09-30 DIAGNOSIS — I509 Heart failure, unspecified: Secondary | ICD-10-CM | POA: Diagnosis not present

## 2013-09-30 DIAGNOSIS — I5033 Acute on chronic diastolic (congestive) heart failure: Secondary | ICD-10-CM | POA: Diagnosis not present

## 2013-09-30 NOTE — Telephone Encounter (Signed)
Juliann Pulse @[facility]  [phone]  I also inquired if [facility] had provided pt with CPAP titration as per our referral placed on 09/19/13 and she stated no but she would check with her office

## 2013-09-30 NOTE — Telephone Encounter (Signed)
[facility] RN states pt gained 5 lbs over nite.No c/o SOB.has taken torsemide 40 mg bid without wt loss.Per Dr.Branch, pt should increase torsemide to 60 mg bid for the next 2 days.Juliann Pulse will call back with status check

## 2013-10-01 DIAGNOSIS — I509 Heart failure, unspecified: Secondary | ICD-10-CM | POA: Diagnosis not present

## 2013-10-01 DIAGNOSIS — I2789 Other specified pulmonary heart diseases: Secondary | ICD-10-CM | POA: Diagnosis not present

## 2013-10-01 DIAGNOSIS — I5033 Acute on chronic diastolic (congestive) heart failure: Secondary | ICD-10-CM | POA: Diagnosis not present

## 2013-10-01 DIAGNOSIS — I10 Essential (primary) hypertension: Secondary | ICD-10-CM | POA: Diagnosis not present

## 2013-10-01 DIAGNOSIS — G4733 Obstructive sleep apnea (adult) (pediatric): Secondary | ICD-10-CM | POA: Diagnosis not present

## 2013-10-01 DIAGNOSIS — I872 Venous insufficiency (chronic) (peripheral): Secondary | ICD-10-CM | POA: Diagnosis not present

## 2013-10-02 DIAGNOSIS — I872 Venous insufficiency (chronic) (peripheral): Secondary | ICD-10-CM | POA: Diagnosis not present

## 2013-10-02 DIAGNOSIS — I509 Heart failure, unspecified: Secondary | ICD-10-CM | POA: Diagnosis not present

## 2013-10-02 DIAGNOSIS — I10 Essential (primary) hypertension: Secondary | ICD-10-CM | POA: Diagnosis not present

## 2013-10-02 DIAGNOSIS — I2789 Other specified pulmonary heart diseases: Secondary | ICD-10-CM | POA: Diagnosis not present

## 2013-10-02 DIAGNOSIS — I5033 Acute on chronic diastolic (congestive) heart failure: Secondary | ICD-10-CM | POA: Diagnosis not present

## 2013-10-02 DIAGNOSIS — G4733 Obstructive sleep apnea (adult) (pediatric): Secondary | ICD-10-CM | POA: Diagnosis not present

## 2013-10-03 DIAGNOSIS — Q5564 Hidden penis: Secondary | ICD-10-CM | POA: Diagnosis not present

## 2013-10-03 DIAGNOSIS — I70209 Unspecified atherosclerosis of native arteries of extremities, unspecified extremity: Secondary | ICD-10-CM | POA: Diagnosis not present

## 2013-10-04 DIAGNOSIS — I5033 Acute on chronic diastolic (congestive) heart failure: Secondary | ICD-10-CM | POA: Diagnosis not present

## 2013-10-04 DIAGNOSIS — I872 Venous insufficiency (chronic) (peripheral): Secondary | ICD-10-CM | POA: Diagnosis not present

## 2013-10-04 DIAGNOSIS — I2789 Other specified pulmonary heart diseases: Secondary | ICD-10-CM | POA: Diagnosis not present

## 2013-10-04 DIAGNOSIS — I10 Essential (primary) hypertension: Secondary | ICD-10-CM | POA: Diagnosis not present

## 2013-10-04 DIAGNOSIS — I509 Heart failure, unspecified: Secondary | ICD-10-CM | POA: Diagnosis not present

## 2013-10-04 DIAGNOSIS — G4733 Obstructive sleep apnea (adult) (pediatric): Secondary | ICD-10-CM | POA: Diagnosis not present

## 2013-10-10 DIAGNOSIS — M961 Postlaminectomy syndrome, not elsewhere classified: Secondary | ICD-10-CM | POA: Diagnosis not present

## 2013-10-10 DIAGNOSIS — M47817 Spondylosis without myelopathy or radiculopathy, lumbosacral region: Secondary | ICD-10-CM | POA: Diagnosis not present

## 2013-10-10 DIAGNOSIS — M1710 Unilateral primary osteoarthritis, unspecified knee: Secondary | ICD-10-CM | POA: Diagnosis not present

## 2013-10-10 DIAGNOSIS — G894 Chronic pain syndrome: Secondary | ICD-10-CM | POA: Diagnosis not present

## 2013-10-10 DIAGNOSIS — IMO0002 Reserved for concepts with insufficient information to code with codable children: Secondary | ICD-10-CM | POA: Diagnosis not present

## 2013-10-14 DIAGNOSIS — I10 Essential (primary) hypertension: Secondary | ICD-10-CM | POA: Diagnosis not present

## 2013-10-14 DIAGNOSIS — I872 Venous insufficiency (chronic) (peripheral): Secondary | ICD-10-CM | POA: Diagnosis not present

## 2013-10-14 DIAGNOSIS — I5033 Acute on chronic diastolic (congestive) heart failure: Secondary | ICD-10-CM | POA: Diagnosis not present

## 2013-10-14 DIAGNOSIS — G4733 Obstructive sleep apnea (adult) (pediatric): Secondary | ICD-10-CM | POA: Diagnosis not present

## 2013-10-14 DIAGNOSIS — I509 Heart failure, unspecified: Secondary | ICD-10-CM | POA: Diagnosis not present

## 2013-10-14 DIAGNOSIS — I2789 Other specified pulmonary heart diseases: Secondary | ICD-10-CM | POA: Diagnosis not present

## 2013-10-22 DIAGNOSIS — I872 Venous insufficiency (chronic) (peripheral): Secondary | ICD-10-CM | POA: Diagnosis not present

## 2013-10-22 DIAGNOSIS — I509 Heart failure, unspecified: Secondary | ICD-10-CM | POA: Diagnosis not present

## 2013-10-22 DIAGNOSIS — I2789 Other specified pulmonary heart diseases: Secondary | ICD-10-CM | POA: Diagnosis not present

## 2013-10-22 DIAGNOSIS — I5033 Acute on chronic diastolic (congestive) heart failure: Secondary | ICD-10-CM | POA: Diagnosis not present

## 2013-10-22 DIAGNOSIS — I10 Essential (primary) hypertension: Secondary | ICD-10-CM | POA: Diagnosis not present

## 2013-10-22 DIAGNOSIS — G4733 Obstructive sleep apnea (adult) (pediatric): Secondary | ICD-10-CM | POA: Diagnosis not present

## 2013-11-05 DIAGNOSIS — N400 Enlarged prostate without lower urinary tract symptoms: Secondary | ICD-10-CM | POA: Diagnosis not present

## 2013-11-06 ENCOUNTER — Ambulatory Visit: Payer: Medicare Other | Admitting: Cardiology

## 2013-11-06 DIAGNOSIS — G894 Chronic pain syndrome: Secondary | ICD-10-CM | POA: Diagnosis not present

## 2013-11-06 DIAGNOSIS — M545 Low back pain, unspecified: Secondary | ICD-10-CM | POA: Diagnosis not present

## 2013-11-06 DIAGNOSIS — M1710 Unilateral primary osteoarthritis, unspecified knee: Secondary | ICD-10-CM | POA: Diagnosis not present

## 2013-11-06 DIAGNOSIS — M47817 Spondylosis without myelopathy or radiculopathy, lumbosacral region: Secondary | ICD-10-CM | POA: Diagnosis not present

## 2013-11-06 DIAGNOSIS — M25569 Pain in unspecified knee: Secondary | ICD-10-CM | POA: Diagnosis not present

## 2013-11-06 DIAGNOSIS — IMO0002 Reserved for concepts with insufficient information to code with codable children: Secondary | ICD-10-CM | POA: Diagnosis not present

## 2013-11-07 ENCOUNTER — Ambulatory Visit: Payer: Medicare Other | Admitting: Cardiology

## 2013-11-07 DIAGNOSIS — I5033 Acute on chronic diastolic (congestive) heart failure: Secondary | ICD-10-CM | POA: Diagnosis not present

## 2013-11-07 DIAGNOSIS — G4733 Obstructive sleep apnea (adult) (pediatric): Secondary | ICD-10-CM | POA: Diagnosis not present

## 2013-11-07 DIAGNOSIS — I10 Essential (primary) hypertension: Secondary | ICD-10-CM | POA: Diagnosis not present

## 2013-11-07 DIAGNOSIS — I872 Venous insufficiency (chronic) (peripheral): Secondary | ICD-10-CM | POA: Diagnosis not present

## 2013-11-07 DIAGNOSIS — I2789 Other specified pulmonary heart diseases: Secondary | ICD-10-CM | POA: Diagnosis not present

## 2013-11-07 DIAGNOSIS — I509 Heart failure, unspecified: Secondary | ICD-10-CM | POA: Diagnosis not present

## 2013-11-11 ENCOUNTER — Encounter: Payer: Self-pay | Admitting: Internal Medicine

## 2013-11-12 ENCOUNTER — Encounter: Payer: Self-pay | Admitting: Nurse Practitioner

## 2013-11-12 ENCOUNTER — Ambulatory Visit (INDEPENDENT_AMBULATORY_CARE_PROVIDER_SITE_OTHER): Payer: Medicare Other | Admitting: Nurse Practitioner

## 2013-11-12 VITALS — BP 126/74 | HR 92 | Wt 290.0 lb

## 2013-11-12 DIAGNOSIS — I2510 Atherosclerotic heart disease of native coronary artery without angina pectoris: Secondary | ICD-10-CM

## 2013-11-12 DIAGNOSIS — G43109 Migraine with aura, not intractable, without status migrainosus: Secondary | ICD-10-CM

## 2013-11-12 NOTE — Patient Instructions (Addendum)
Since you have not had recurrent headache or visual symptoms similar to the episode in December, we will see you on an as needed basis.    You may take Extra-Strength Tylenol as needed for headache when they occur.  You do not have headaches frequently enough to justify preventative medication.

## 2013-11-12 NOTE — Progress Notes (Signed)
PATIENT: [name] [name] DOB: 04/25/1941  REASON FOR VISIT: routine follow up for complicated Migraine HISTORY FROM: patient  HISTORY OF PRESENT ILLNESS: UPDATE 11/12/13 (LL): Since last visit, patient has not had any recurrent episodes of headache with visual changes.  He reports only 1 headache since last here, treated with Tylenol.  He did not continue taking Verapamil, after hospitalization in December for unknown reason, thus it was not increased after last office visit.  He had 4 hospitalizations in the past 6 months for TURP surgery and CHF exacerbations.  His wife passed in early March as well due to COPD. He has started wearing cpap again and getting good results.  He hopes to have cataracts removed at [facility] soon.  His biggest concerns are chronic back pain, and walking difficulty which is chronic as well.  Prior HPI (VP): 73 year old male with headache and blurred vision. I saw patient in the hospital recently when he was evaluated for possible stroke. Patient describes sudden onset pain in his right neck, right face, right eye, felt like lightning coming out and then he saw swirling colors on the ground in front of them. He then also developed right face, arm, leg weakness. Patient has been discharged from the hospital and is doing well. He is continuing to have some mild headache. He was discharged and verapamil 80 mg daily which has helped him.   REVIEW OF SYSTEMS: Full 14 system review of systems performed and notable only for snoring, memory loss, headache, pain in joints, leg swelling, increased thirst, blurred vision, eye pain, eye itching, eye discharge, light sensitivity, shortness of breath, snoring, swelling in legs, ringing in ears, impotence. Frequency of urination, urinary urgency.  ALLERGIES: Allergies  Allergen Reactions  . Aspirin Other (See Comments)    Nausea and upset stomach      HOME MEDICATIONS: Outpatient Prescriptions Prior to Visit  Medication Sig  Dispense Refill  . acetaminophen (TYLENOL) 325 MG tablet Take 325-650 mg by mouth every 6 (six) hours as needed for pain.       Marland Kitchen atorvastatin (LIPITOR) 80 MG tablet Take 80 mg by mouth at bedtime.      . cyclobenzaprine (FLEXERIL) 10 MG tablet Take 1 tablet (10 mg total) by mouth 3 (three) times daily as needed for muscle spasms.  45 tablet  0  . docusate sodium (COLACE) 100 MG capsule Take 100 mg by mouth daily as needed for mild constipation.       Marland Kitchen latanoprost (XALATAN) 0.005 % ophthalmic solution Place 1 drop into both eyes at bedtime.      . metoprolol succinate (TOPROL-XL) 25 MG 24 hr tablet Take 1 tablet (25 mg total) by mouth every morning.  30 tablet  11  . oxyCODONE-acetaminophen (PERCOCET) 10-325 MG per tablet Take 1 tablet by mouth every 6 (six) hours as needed for pain.  90 tablet  0  . pantoprazole (PROTONIX) 40 MG tablet Take 40 mg by mouth 2 (two) times daily.      Vladimir Faster Glycol-Propyl Glycol (LUBRICANT EYE DROPS) 0.4-0.3 % SOLN Apply 1 drop to eye daily as needed (for lubricant eye drops).      . timolol (TIMOPTIC-XR) 0.5 % ophthalmic gel-forming Place 1 drop into both eyes 2 (two) times daily.      . potassium chloride SA (K-DUR,KLOR-CON) 20 MEQ tablet Take 1 tablet (20 mEq total) by mouth 2 (two) times daily.  60 tablet  0  . torsemide (DEMADEX) 20 MG tablet Take  20 mg by mouth 2 (two) times daily.       . cetirizine (ZYRTEC) 10 MG tablet Take 10 mg by mouth daily.      . ondansetron (ZOFRAN) 4 MG tablet Take 1 tablet (4 mg total) by mouth every 8 (eight) hours as needed for nausea or vomiting.  30 tablet  1  . Tamsulosin HCl (FLOMAX) 0.4 MG CAPS Take 0.4 mg by mouth daily after supper.        No facility-administered medications prior to visit.     PHYSICAL EXAM  Filed Vitals:   11/12/13 1042  BP: 126/74  Pulse: 92  Weight: 290 lb (131.543 kg)   Body mass index is 46.83 kg/(m^2). No exam data present   Generalized: Well developed, in no acute distress,  obese elderly AA male. Head: normocephalic and atraumatic. Oropharynx benign. Right eye injection, bilateral Xanthelasma. Neck: Supple, no carotid bruits  Cardiac: Regular rate rhythm, no murmur  Musculoskeletal: No deformity   Neurological examination  MENTAL STATUS: awake, alert, oriented to person, place and time, recent and remote memory intact, normal attention and concentration, language fluent, comprehension intact, naming intact, fund of knowledge appropriate  CRANIAL NERVE:  pupils equal and reactive to light, visual fields full to confrontation, extraocular muscles intact, no nystagmus, facial sensation and strength symmetric, hearing intact, palate elevates symmetrically, uvula midline, shoulder shrug symmetric, tongue midline.  MOTOR: normal bulk and tone, full strength in the BUE, LLE; RLE (HF 3, KE 3, KF 3, DF 4).  SENSORY: normal and symmetric to light touch  COORDINATION: finger-nose-finger NORMAL.  REFLEXES: deep tendon reflexes TRACE and symmetric  GAIT/STATION: narrow based gait; ANTALGIC, LIMPING ON RIGHT LEG. UNSTEADY. Walks with cane.  05/04/13 MRI brain - Negative for acute infarct. Hypodensity right cerebellum on CT apparently was an artifact. No acute or chronic infarct or mass is seen in this area.  05/04/13 MRA head - no stenosis  05/04/13 MRA neck - poor signal in the proximal common carotid arteries bilaterally likely related to artifact however atherosclerotic disease not excluded. However, the carotid bifurcation is widely patent bilaterally suggesting the patient does not have significant atherosclerotic disease elsewhere.    ASSESSMENT AND PLAN 73 y.o. year old male here with suspected complicated migraine, and history of lumbar radiculopathy and spinal stenosis, s/p surgery. He has had only 1-2 minor headaches in the last 6 months and no recurrent episodes of complicated Migraine that occurred in December 2014.  He gets good relief from headaches with Tylenol  and preventative headache medication is not needed at this time.  PLAN:  He was advised to continue Tylenol as needed for occasional headache. He may be seen in our office if headaches worsen, or in the future if other needs arise.  Philmore Pali, MSN, NP-C 11/12/2013, 11:23 AM [facility] [address], [place], [place] [address] [phone]  Note: This document was prepared with digital dictation and possible smart phrase technology. Any transcriptional errors that result from this process are unintentional.

## 2013-11-13 ENCOUNTER — Ambulatory Visit: Payer: Medicare Other | Admitting: Cardiology

## 2013-11-14 ENCOUNTER — Other Ambulatory Visit: Payer: Self-pay | Admitting: Cardiology

## 2013-11-14 ENCOUNTER — Ambulatory Visit (INDEPENDENT_AMBULATORY_CARE_PROVIDER_SITE_OTHER): Payer: Medicare Other | Admitting: Cardiology

## 2013-11-14 ENCOUNTER — Encounter: Payer: Self-pay | Admitting: Cardiology

## 2013-11-14 VITALS — BP 128/84 | HR 96 | Ht 68.0 in | Wt 287.0 lb

## 2013-11-14 DIAGNOSIS — I2510 Atherosclerotic heart disease of native coronary artery without angina pectoris: Secondary | ICD-10-CM

## 2013-11-14 DIAGNOSIS — I10 Essential (primary) hypertension: Secondary | ICD-10-CM | POA: Diagnosis not present

## 2013-11-14 DIAGNOSIS — R609 Edema, unspecified: Secondary | ICD-10-CM | POA: Diagnosis not present

## 2013-11-14 DIAGNOSIS — I5032 Chronic diastolic (congestive) heart failure: Secondary | ICD-10-CM

## 2013-11-14 DIAGNOSIS — Z79899 Other long term (current) drug therapy: Secondary | ICD-10-CM | POA: Diagnosis not present

## 2013-11-14 DIAGNOSIS — N259 Disorder resulting from impaired renal tubular function, unspecified: Secondary | ICD-10-CM | POA: Diagnosis not present

## 2013-11-14 LAB — BASIC METABOLIC PANEL
BUN: 13 mg/dL (ref 6–23)
CALCIUM: 9.3 mg/dL (ref 8.4–10.5)
CO2: 32 mEq/L (ref 19–32)
Chloride: 99 mEq/L (ref 96–112)
Creat: 1.08 mg/dL (ref 0.50–1.35)
Glucose, Bld: 138 mg/dL — ABNORMAL HIGH (ref 70–99)
Potassium: 3.7 mEq/L (ref 3.5–5.3)
SODIUM: 139 meq/L (ref 135–145)

## 2013-11-14 LAB — MAGNESIUM: Magnesium: 2 mg/dL (ref 1.5–2.5)

## 2013-11-14 MED ORDER — POTASSIUM CHLORIDE 20 MEQ PO PACK: 20.0000 meq | PACK | Freq: Two times a day (BID) | ORAL | Status: DC

## 2013-11-14 NOTE — Patient Instructions (Signed)
Continue all current medications. Lab for BMET, Magnesium  Office will contact with results via phone or letter.   Your physician wants you to follow up in:  4 months.  You will receive a reminder letter in the mail one-two months in advance.  If you don't receive a letter, please call our office to schedule the follow up appointment

## 2013-11-14 NOTE — Progress Notes (Addendum)
Clinical Summary [name] [name] is a 73 y.o.male seen today for follow up, this is a focused visit on his history of chronic diastolic heart failure.   1. Chronic diastolic heart failure  - echo 04/2013 LVEF 40-98%, grade I diastolic dysfunction. RV was poorly visualized.  - recent admission for volume overload, likely secondary to dietary indiscrestion. He was diuresed and discharged home  - chronic LE edema with venous insufficiency, has been unable to tolerate stockings in the past  - admit 4/14 with dehydration, his diuretics had been increased to torsemide 60mg  bid and metolazone 2.5 mg daily.  - currently on torsemide 40mg  bid, off metolazone. Weight remains stable in the 285-290 range from his home scales, typically around 287.  - denies any significant DOE.  - limiting sodium intake.   2. OSA - patient reports compliance with CPAP, energy level much improved.   Past Medical History  Diagnosis Date  . CHF (congestive heart failure)   . Obesity   . Hyperlipidemia   . PUD (peptic ulcer disease)   . GERD (gastroesophageal reflux disease)   . S/P endoscopy 2007, 2012    2007: nl, May 2012: antral erosions  . Osteoarthritis   . S/P colonoscopy 2007, Feb and March 2012    hx of adenomas, left-sided diverticula, On 07/2010 TCS, fresh blood and clot noted coming from TI.  Marland Kitchen Anxiety   . Hypothyroidism     hypothyroid  . Ringing in ears   . History of kidney stones   . History of bladder infections   . Heel spur     right heel  . Collagen vascular disease     history of venous insufficency and LE cellulitis  . Thrombocytopenia 12/30/2011    Stable  . BPH (benign prostatic hyperplasia)   . Hypertension     Sees Dr. Debara Pickett  . Bilateral lower leg cellulitis   . Sleep apnea     uses oxygen 2L via [place] cannot tolerate CPAP - sleep study 2008 AHI 48.80/hr and 56.70hr during REM  . Complication of anesthesia     due to sleep apnea pt has had difficulty being put under anesthesia    . PONV (postoperative nausea and vomiting)   . Deep venous insufficiency   . Chronic diastolic HF (heart failure)      Allergies  Allergen Reactions  . Aspirin Other (See Comments)    Nausea and upset stomach       Current Outpatient Prescriptions  Medication Sig Dispense Refill  . acetaminophen (TYLENOL) 325 MG tablet Take 325-650 mg by mouth every 6 (six) hours as needed for pain.       Marland Kitchen atorvastatin (LIPITOR) 80 MG tablet Take 80 mg by mouth at bedtime.      . cyclobenzaprine (FLEXERIL) 10 MG tablet Take 1 tablet (10 mg total) by mouth 3 (three) times daily as needed for muscle spasms.  45 tablet  0  . docusate sodium (COLACE) 100 MG capsule Take 100 mg by mouth daily as needed for mild constipation.       Marland Kitchen latanoprost (XALATAN) 0.005 % ophthalmic solution Place 1 drop into both eyes at bedtime.      . metoprolol succinate (TOPROL-XL) 25 MG 24 hr tablet Take 1 tablet (25 mg total) by mouth every morning.  30 tablet  11  . oxyCODONE-acetaminophen (PERCOCET) 10-325 MG per tablet Take 1 tablet by mouth every 6 (six) hours as needed for pain.  90 tablet  0  .  pantoprazole (PROTONIX) 40 MG tablet Take 40 mg by mouth 2 (two) times daily.      Vladimir Faster Glycol-Propyl Glycol (LUBRICANT EYE DROPS) 0.4-0.3 % SOLN Apply 1 drop to eye daily as needed (for lubricant eye drops).      . promethazine (PHENERGAN) 12.5 MG tablet Take 12.5 mg by mouth every 6 (six) hours as needed for nausea or vomiting.      . timolol (TIMOPTIC-XR) 0.5 % ophthalmic gel-forming Place 1 drop into both eyes 2 (two) times daily.       No current facility-administered medications for this visit.     Past Surgical History  Procedure Laterality Date  . Knee arthroscopy    . Rotator cuff repair    . Cholecystectomy    . Lumbar spine surgery    . Small bowel capsule study  07/2011    ?transient focal ischemia in distal small bowel to explain GI bleeding  . Esophagogastroduodenoscopy  Aug 2013    [facility], single  36mm pedunculated polyp otherwise normal. HYPERPLASTIC, NEGATIVE h.pylori  . Cardiac catheterization  02/27/2007    no sign of CAD, LVH, mod pulm HTN (Dr. Jackie Plum(  . Transurethral resection of prostate    . Transurethral resection of prostate N/A 06/18/2013    Procedure: TRANSURETHRAL RESECTION OF THE PROSTATE (TURP);  Surgeon: Marissa Nestle, MD;  Location: [facility];  Service: Urology;  Laterality: N/A;  . Transthoracic echocardiogram  09/2008    EF 60-65%, mod conc LVH  . Nm myocar perf wall motion  12/2006    dipyridamole myoview - stress images show mild perfusion defect in mid inferior walls with reversibility at rest; mild perfusion defect in mid inferolateral wall at stress with mild defect reversibility, EF 62%, abnormal but low risk study      Allergies  Allergen Reactions  . Aspirin Other (See Comments)    Nausea and upset stomach        Family History  Problem Relation Age of Onset  . Colon cancer Father 66    deceased  . Prostate cancer Father   . Pancreatic cancer Mother 40    deceased     Social History Mr. Dougal reports that he quit smoking about 40 years ago. His smoking use included Cigars. He quit smokeless tobacco use about 40 years ago. Mr. Usrey reports that he does not drink alcohol.   Review of Systems CONSTITUTIONAL: No weight loss, fever, chills, weakness or fatigue.  HEENT: Eyes: No visual loss, blurred vision, double vision or yellow sclerae.No hearing loss, sneezing, congestion, runny nose or sore throat.  SKIN: No rash or itching.  CARDIOVASCULAR: per HPI RESPIRATORY: No shortness of breath, cough or sputum.  GASTROINTESTINAL: No anorexia, nausea, vomiting or diarrhea. No abdominal pain or blood.  GENITOURINARY: No burning on urination, no polyuria NEUROLOGICAL: No headache, dizziness, syncope, paralysis, ataxia, numbness or tingling in the extremities. No change in bowel or bladder control.  MUSCULOSKELETAL: No muscle, back pain, joint  pain or stiffness.  LYMPHATICS: No enlarged nodes. No history of splenectomy.  PSYCHIATRIC: No history of depression or anxiety.  ENDOCRINOLOGIC: No reports of sweating, cold or heat intolerance. No polyuria or polydipsia.  Marland Kitchen   Physical Examination p 96 bp 128/84 Wt 287 lbs BMI 46 Gen: resting comfortably, no acute distress HEENT: no scleral icterus, pupils equal round and reactive, no palptable cervical adenopathy,  CV: RRR, no m/r/g, no JVD Resp: Clear to auscultation bilaterally GI: abdomen is soft, non-tender, non-distended, normal bowel sounds,  no hepatosplenomegaly MSK: extremities are warm, 1+ non-pitting edema Skin: warm, no rash Neuro:  no focal deficits Psych: appropriate affect   Diagnostic Studies 04/2013 Echo Study Conclusions  - Procedure narrative: Transthoracic echocardiography. Technically difficult study with reduced echocardiographic windows. - Left ventricle: The cavity size was normal. There was moderate concentric hypertrophy. Systolic function was vigorous. The estimated ejection fraction was in the range of 65% to 70%. Wall motion was normal; there were no regional wall motion abnormalities. Doppler parameters are consistent with abnormal left ventricular relaxation (grade 1 diastolic dysfunction). The E/e' ratio is <10, suggesting normal LV filling pressure. - Left atrium: The atrium was normal in size. - Tricuspid valve: Mild regurgitation. - Pericardium, extracardiac: There was no pericardial effusion.      Assessment and Plan  1`. Chronic diastolic heart failure  - labile volume status over the last few months, with admissions for volume overload and dehydration. Recently has stabilized on current dose of diuretics, toresmide 40mg  bid. Appears euvolemic today. - Counseled when weight 290 or higher ok to take extra torsemide - will repeat BMET  2. OSA - compliant with CPAP, energy level has increased.  - continue CPAP  F/u 4  months   Arnoldo Lenis, M.D., F.A.C.C.

## 2013-11-20 ENCOUNTER — Telehealth: Payer: Self-pay | Admitting: *Deleted

## 2013-11-20 NOTE — Telephone Encounter (Signed)
Notes Recorded by Laurine Blazer, LPN on 07/28/3426 at 7:68 PM Patient notified and verbalized understanding.

## 2013-11-20 NOTE — Telephone Encounter (Signed)
Message copied by Laurine Blazer on Wed Nov 20, 2013  4:57 PM ------      Message from: Arnoldo Lenis      Created: Fri Nov 15, 2013 11:24 AM       Labs look good            Zandra Abts MD ------

## 2013-11-27 DIAGNOSIS — R05 Cough: Secondary | ICD-10-CM | POA: Diagnosis not present

## 2013-11-27 DIAGNOSIS — R059 Cough, unspecified: Secondary | ICD-10-CM | POA: Diagnosis not present

## 2013-11-27 DIAGNOSIS — Z6841 Body Mass Index (BMI) 40.0 and over, adult: Secondary | ICD-10-CM | POA: Diagnosis not present

## 2013-11-27 DIAGNOSIS — J329 Chronic sinusitis, unspecified: Secondary | ICD-10-CM | POA: Diagnosis not present

## 2013-11-28 DIAGNOSIS — H4011X0 Primary open-angle glaucoma, stage unspecified: Secondary | ICD-10-CM | POA: Diagnosis not present

## 2013-11-28 DIAGNOSIS — H52219 Irregular astigmatism, unspecified eye: Secondary | ICD-10-CM | POA: Diagnosis not present

## 2013-11-28 DIAGNOSIS — H2510 Age-related nuclear cataract, unspecified eye: Secondary | ICD-10-CM | POA: Diagnosis not present

## 2013-11-28 DIAGNOSIS — I440 Atrioventricular block, first degree: Secondary | ICD-10-CM | POA: Diagnosis not present

## 2013-11-28 DIAGNOSIS — H409 Unspecified glaucoma: Secondary | ICD-10-CM | POA: Diagnosis not present

## 2013-11-28 NOTE — Progress Notes (Signed)
I reviewed note and agree with plan.   Ashiya Kinkead R. Berdine Rasmusson, MD  Certified in Neurology, Neurophysiology and Neuroimaging  [facility] [address], [address] [place], [place] [address] [phone]   

## 2013-12-03 DIAGNOSIS — Z809 Family history of malignant neoplasm, unspecified: Secondary | ICD-10-CM | POA: Diagnosis not present

## 2013-12-03 DIAGNOSIS — I509 Heart failure, unspecified: Secondary | ICD-10-CM | POA: Diagnosis not present

## 2013-12-03 DIAGNOSIS — Z981 Arthrodesis status: Secondary | ICD-10-CM | POA: Diagnosis not present

## 2013-12-03 DIAGNOSIS — Z8249 Family history of ischemic heart disease and other diseases of the circulatory system: Secondary | ICD-10-CM | POA: Diagnosis not present

## 2013-12-03 DIAGNOSIS — K219 Gastro-esophageal reflux disease without esophagitis: Secondary | ICD-10-CM | POA: Diagnosis not present

## 2013-12-03 DIAGNOSIS — Z83511 Family history of glaucoma: Secondary | ICD-10-CM | POA: Diagnosis not present

## 2013-12-03 DIAGNOSIS — Z87891 Personal history of nicotine dependence: Secondary | ICD-10-CM | POA: Diagnosis not present

## 2013-12-03 DIAGNOSIS — Z83518 Family history of other specified eye disorder: Secondary | ICD-10-CM | POA: Diagnosis not present

## 2013-12-03 DIAGNOSIS — Z886 Allergy status to analgesic agent status: Secondary | ICD-10-CM | POA: Diagnosis not present

## 2013-12-03 DIAGNOSIS — H2510 Age-related nuclear cataract, unspecified eye: Secondary | ICD-10-CM | POA: Diagnosis not present

## 2013-12-03 DIAGNOSIS — H409 Unspecified glaucoma: Secondary | ICD-10-CM | POA: Diagnosis not present

## 2013-12-03 DIAGNOSIS — Z833 Family history of diabetes mellitus: Secondary | ICD-10-CM | POA: Diagnosis not present

## 2013-12-03 DIAGNOSIS — G4733 Obstructive sleep apnea (adult) (pediatric): Secondary | ICD-10-CM | POA: Diagnosis not present

## 2013-12-03 DIAGNOSIS — H4010X0 Unspecified open-angle glaucoma, stage unspecified: Secondary | ICD-10-CM | POA: Diagnosis not present

## 2013-12-03 DIAGNOSIS — I10 Essential (primary) hypertension: Secondary | ICD-10-CM | POA: Diagnosis not present

## 2013-12-03 DIAGNOSIS — H4011X0 Primary open-angle glaucoma, stage unspecified: Secondary | ICD-10-CM | POA: Diagnosis not present

## 2013-12-04 DIAGNOSIS — H409 Unspecified glaucoma: Secondary | ICD-10-CM | POA: Diagnosis not present

## 2013-12-04 DIAGNOSIS — H4011X0 Primary open-angle glaucoma, stage unspecified: Secondary | ICD-10-CM | POA: Diagnosis not present

## 2013-12-31 DIAGNOSIS — I739 Peripheral vascular disease, unspecified: Secondary | ICD-10-CM | POA: Diagnosis not present

## 2014-01-09 DIAGNOSIS — R05 Cough: Secondary | ICD-10-CM | POA: Diagnosis not present

## 2014-01-09 DIAGNOSIS — E039 Hypothyroidism, unspecified: Secondary | ICD-10-CM | POA: Diagnosis not present

## 2014-01-09 DIAGNOSIS — J0100 Acute maxillary sinusitis, unspecified: Secondary | ICD-10-CM | POA: Diagnosis not present

## 2014-01-09 DIAGNOSIS — I10 Essential (primary) hypertension: Secondary | ICD-10-CM | POA: Diagnosis not present

## 2014-01-09 DIAGNOSIS — R059 Cough, unspecified: Secondary | ICD-10-CM | POA: Diagnosis not present

## 2014-01-09 DIAGNOSIS — G8929 Other chronic pain: Secondary | ICD-10-CM | POA: Diagnosis not present

## 2014-01-09 DIAGNOSIS — J209 Acute bronchitis, unspecified: Secondary | ICD-10-CM | POA: Diagnosis not present

## 2014-01-09 DIAGNOSIS — Z6841 Body Mass Index (BMI) 40.0 and over, adult: Secondary | ICD-10-CM | POA: Diagnosis not present

## 2014-02-25 DIAGNOSIS — M47816 Spondylosis without myelopathy or radiculopathy, lumbar region: Secondary | ICD-10-CM | POA: Diagnosis not present

## 2014-02-25 DIAGNOSIS — Z6841 Body Mass Index (BMI) 40.0 and over, adult: Secondary | ICD-10-CM | POA: Diagnosis not present

## 2014-02-27 ENCOUNTER — Other Ambulatory Visit: Payer: Self-pay | Admitting: Internal Medicine

## 2014-03-05 DIAGNOSIS — M545 Low back pain: Secondary | ICD-10-CM | POA: Diagnosis not present

## 2014-03-05 DIAGNOSIS — M179 Osteoarthritis of knee, unspecified: Secondary | ICD-10-CM | POA: Diagnosis not present

## 2014-03-05 DIAGNOSIS — M4727 Other spondylosis with radiculopathy, lumbosacral region: Secondary | ICD-10-CM | POA: Diagnosis not present

## 2014-03-05 DIAGNOSIS — M25561 Pain in right knee: Secondary | ICD-10-CM | POA: Diagnosis not present

## 2014-03-05 DIAGNOSIS — M25562 Pain in left knee: Secondary | ICD-10-CM | POA: Diagnosis not present

## 2014-03-05 DIAGNOSIS — G894 Chronic pain syndrome: Secondary | ICD-10-CM | POA: Diagnosis not present

## 2014-03-06 ENCOUNTER — Emergency Department (HOSPITAL_COMMUNITY): Payer: Medicare Other

## 2014-03-06 ENCOUNTER — Emergency Department (HOSPITAL_COMMUNITY)
Admission: EM | Admit: 2014-03-06 | Discharge: 2014-03-06 | Disposition: A | Discharge status: Home / Self Care | Payer: Medicare Other | Attending: Emergency Medicine | Admitting: Emergency Medicine

## 2014-03-06 ENCOUNTER — Encounter (HOSPITAL_COMMUNITY): Payer: Self-pay | Admitting: Emergency Medicine

## 2014-03-06 DIAGNOSIS — Y92013 Bedroom of single-family (private) house as the place of occurrence of the external cause: Secondary | ICD-10-CM | POA: Diagnosis not present

## 2014-03-06 DIAGNOSIS — Y9389 Activity, other specified: Secondary | ICD-10-CM | POA: Diagnosis not present

## 2014-03-06 DIAGNOSIS — Z87448 Personal history of other diseases of urinary system: Secondary | ICD-10-CM | POA: Diagnosis not present

## 2014-03-06 DIAGNOSIS — Z872 Personal history of diseases of the skin and subcutaneous tissue: Secondary | ICD-10-CM | POA: Insufficient documentation

## 2014-03-06 DIAGNOSIS — K219 Gastro-esophageal reflux disease without esophagitis: Secondary | ICD-10-CM | POA: Insufficient documentation

## 2014-03-06 DIAGNOSIS — S8002XA Contusion of left knee, initial encounter: Secondary | ICD-10-CM | POA: Diagnosis not present

## 2014-03-06 DIAGNOSIS — M1990 Unspecified osteoarthritis, unspecified site: Secondary | ICD-10-CM | POA: Insufficient documentation

## 2014-03-06 DIAGNOSIS — W010XXA Fall on same level from slipping, tripping and stumbling without subsequent striking against object, initial encounter: Secondary | ICD-10-CM | POA: Diagnosis not present

## 2014-03-06 DIAGNOSIS — G4730 Sleep apnea, unspecified: Secondary | ICD-10-CM | POA: Insufficient documentation

## 2014-03-06 DIAGNOSIS — Z87891 Personal history of nicotine dependence: Secondary | ICD-10-CM | POA: Insufficient documentation

## 2014-03-06 DIAGNOSIS — S40812A Abrasion of left upper arm, initial encounter: Secondary | ICD-10-CM | POA: Insufficient documentation

## 2014-03-06 DIAGNOSIS — D72829 Elevated white blood cell count, unspecified: Secondary | ICD-10-CM

## 2014-03-06 DIAGNOSIS — Z9981 Dependence on supplemental oxygen: Secondary | ICD-10-CM | POA: Insufficient documentation

## 2014-03-06 DIAGNOSIS — I5032 Chronic diastolic (congestive) heart failure: Secondary | ICD-10-CM | POA: Insufficient documentation

## 2014-03-06 DIAGNOSIS — D696 Thrombocytopenia, unspecified: Secondary | ICD-10-CM | POA: Insufficient documentation

## 2014-03-06 DIAGNOSIS — W06XXXA Fall from bed, initial encounter: Secondary | ICD-10-CM | POA: Diagnosis not present

## 2014-03-06 DIAGNOSIS — S0083XA Contusion of other part of head, initial encounter: Secondary | ICD-10-CM | POA: Diagnosis not present

## 2014-03-06 DIAGNOSIS — Z8659 Personal history of other mental and behavioral disorders: Secondary | ICD-10-CM | POA: Diagnosis not present

## 2014-03-06 DIAGNOSIS — Z23 Encounter for immunization: Secondary | ICD-10-CM | POA: Diagnosis not present

## 2014-03-06 DIAGNOSIS — E785 Hyperlipidemia, unspecified: Secondary | ICD-10-CM | POA: Diagnosis not present

## 2014-03-06 DIAGNOSIS — Z9889 Other specified postprocedural states: Secondary | ICD-10-CM | POA: Insufficient documentation

## 2014-03-06 DIAGNOSIS — S0990XA Unspecified injury of head, initial encounter: Secondary | ICD-10-CM | POA: Diagnosis present

## 2014-03-06 DIAGNOSIS — S0081XA Abrasion of other part of head, initial encounter: Secondary | ICD-10-CM | POA: Insufficient documentation

## 2014-03-06 DIAGNOSIS — S8992XA Unspecified injury of left lower leg, initial encounter: Secondary | ICD-10-CM | POA: Diagnosis not present

## 2014-03-06 DIAGNOSIS — I10 Essential (primary) hypertension: Secondary | ICD-10-CM | POA: Insufficient documentation

## 2014-03-06 DIAGNOSIS — W19XXXA Unspecified fall, initial encounter: Secondary | ICD-10-CM

## 2014-03-06 DIAGNOSIS — E669 Obesity, unspecified: Secondary | ICD-10-CM | POA: Diagnosis not present

## 2014-03-06 DIAGNOSIS — S0003XA Contusion of scalp, initial encounter: Secondary | ICD-10-CM | POA: Diagnosis not present

## 2014-03-06 DIAGNOSIS — S4992XA Unspecified injury of left shoulder and upper arm, initial encounter: Secondary | ICD-10-CM | POA: Diagnosis not present

## 2014-03-06 DIAGNOSIS — Z87442 Personal history of urinary calculi: Secondary | ICD-10-CM | POA: Insufficient documentation

## 2014-03-06 LAB — CBC WITH DIFFERENTIAL/PLATELET
Basophils Absolute: 0 10*3/uL (ref 0.0–0.1)
Basophils Relative: 0 % (ref 0–1)
Eosinophils Absolute: 0 10*3/uL (ref 0.0–0.7)
Eosinophils Relative: 0 % (ref 0–5)
HEMATOCRIT: 40.7 % (ref 39.0–52.0)
HEMOGLOBIN: 13.7 g/dL (ref 13.0–17.0)
LYMPHS ABS: 1.2 10*3/uL (ref 0.7–4.0)
LYMPHS PCT: 8 % — AB (ref 12–46)
MCH: 26.9 pg (ref 26.0–34.0)
MCHC: 33.7 g/dL (ref 30.0–36.0)
MCV: 80 fL (ref 78.0–100.0)
MONO ABS: 0.3 10*3/uL (ref 0.1–1.0)
MONOS PCT: 2 % — AB (ref 3–12)
NEUTROS PCT: 90 % — AB (ref 43–77)
Neutro Abs: 12.8 10*3/uL — ABNORMAL HIGH (ref 1.7–7.7)
Platelets: 133 10*3/uL — ABNORMAL LOW (ref 150–400)
RBC: 5.09 MIL/uL (ref 4.22–5.81)
RDW: 16.6 % — ABNORMAL HIGH (ref 11.5–15.5)
WBC: 14.3 10*3/uL — AB (ref 4.0–10.5)

## 2014-03-06 MED ORDER — TETANUS-DIPHTH-ACELL PERTUSSIS 5-2.5-18.5 LF-MCG/0.5 IM SUSP
0.5000 mL | Freq: Once | INTRAMUSCULAR | Status: AC
  Administered 2014-03-06: 0.5 mL via INTRAMUSCULAR
  Filled 2014-03-06: qty 0.5

## 2014-03-06 NOTE — Discharge Instructions (Signed)
Contusion °A contusion is a deep bruise. Contusions happen when an injury causes bleeding under the skin. Signs of bruising include pain, puffiness (swelling), and discolored skin. The contusion may turn blue, purple, or yellow. °HOME CARE  °· Put ice on the injured area. °· Put ice in a plastic bag. °· Place a towel between your skin and the bag. °· Leave the ice on for 15-20 minutes, 03-04 times a day. °· Only take medicine as told by your doctor. °· Rest the injured area. °· If possible, raise (elevate) the injured area to lessen puffiness. °GET HELP RIGHT AWAY IF:  °· You have more bruising or puffiness. °· You have pain that is getting worse. °· Your puffiness or pain is not helped by medicine. °MAKE SURE YOU:  °· Understand these instructions. °· Will watch your condition. °· Will get help right away if you are not doing well or get worse. °Document Released: 10/26/2007 Document Revised: 08/01/2011 Document Reviewed: 03/14/2011 °ExitCare® Patient Information ©2015 [employer]. This information is not intended to replace advice given to you by your health care provider. Make sure you discuss any questions you have with your health care provider. ° °Fall Prevention and Home Safety °Falls cause injuries and can affect all age groups. It is possible to prevent falls.  °HOW TO PREVENT FALLS °· Wear shoes with rubber soles that do not have an opening for your toes. °· Keep the inside and outside of your house well lit. °· Use night lights throughout your home. °· Remove clutter from floors. °· Clean up floor spills. °· Remove throw rugs or fasten them to the floor with carpet tape. °· Do not place electrical cords across pathways. °· Put grab bars by your tub, shower, and toilet. Do not use towel bars as grab bars. °· Put handrails on both sides of the stairway. Fix loose handrails. °· Do not climb on stools or stepladders, if possible. °· Do not wax your floors. °· Repair uneven or unsafe sidewalks, walkways, or  stairs. °· Keep items you use a lot within reach. °· Be aware of pets. °· Keep emergency numbers next to the telephone. °· Put smoke detectors in your home and near bedrooms. °Ask your doctor what other things you can do to prevent falls. °Document Released: 03/05/2009 Document Revised: 11/08/2011 Document Reviewed: 08/09/2011 °ExitCare® Patient Information ©2015 [employer]. This information is not intended to replace advice given to you by your health care provider. Make sure you discuss any questions you have with your health care provider. ° °

## 2014-03-06 NOTE — ED Notes (Signed)
Pt states he tripped and fell this morning while getting out of the bed. Swelling to left forehead, left eye and abrasion to left knee.

## 2014-03-06 NOTE — ED Provider Notes (Signed)
CSN: [id number]     Arrival date & time 03/06/14  3149 History  This chart was scribed for Shaune Pollack, MD by Rayfield Citizen, ED Scribe. This patient was seen in room APA14/APA14 and the patient's care was started at 9:42 AM.     Chief Complaint  Patient presents with  . Fall   Patient is a 73 y.o. male presenting with fall. The history is provided by the patient. No language interpreter was used.  Fall This is a new problem. The current episode started 1 to 2 hours ago. Nothing aggravates the symptoms. Nothing relieves the symptoms. He has tried nothing for the symptoms.    HPI Comments: [name] [name] is a 73 y.o. male who presents to the Emergency Department complaining of swelling to the left forehead and left eye, as well as an abrasion to the left knee after a simple mechanical fall this morning at 08:45. He explains that he was able to get up on his own after the fall. He denies LOC. He lives with his son but no one else was home at the time of the incident.   Patient denies problems with his feet, hips. He is unaware of the date of his last TDP vaccine.   Past Medical History  Diagnosis Date  . CHF (congestive heart failure)   . Obesity   . Hyperlipidemia   . PUD (peptic ulcer disease)   . GERD (gastroesophageal reflux disease)   . S/P endoscopy 2007, 2012    2007: nl, May 2012: antral erosions  . Osteoarthritis   . S/P colonoscopy 2007, Feb and March 2012    hx of adenomas, left-sided diverticula, On 07/2010 TCS, fresh blood and clot noted coming from TI.  Marland Kitchen Anxiety   . Hypothyroidism     hypothyroid  . Ringing in ears   . History of kidney stones   . History of bladder infections   . Heel spur     right heel  . Collagen vascular disease     history of venous insufficency and LE cellulitis  . Thrombocytopenia 12/30/2011    Stable  . BPH (benign prostatic hyperplasia)   . Hypertension     Sees Dr. Debara Pickett  . Bilateral lower leg cellulitis   . Sleep apnea     uses  oxygen 2L via [place] cannot tolerate CPAP - sleep study 2008 AHI 48.80/hr and 56.70hr during REM  . Complication of anesthesia     due to sleep apnea pt has had difficulty being put under anesthesia  . PONV (postoperative nausea and vomiting)   . Deep venous insufficiency   . Chronic diastolic HF (heart failure)    Past Surgical History  Procedure Laterality Date  . Knee arthroscopy    . Rotator cuff repair    . Cholecystectomy    . Lumbar spine surgery    . Small bowel capsule study  07/2011    ?transient focal ischemia in distal small bowel to explain GI bleeding  . Esophagogastroduodenoscopy  Aug 2013    [facility], single 26mm pedunculated polyp otherwise normal. HYPERPLASTIC, NEGATIVE h.pylori  . Cardiac catheterization  02/27/2007    no sign of CAD, LVH, mod pulm HTN (Dr. Jackie Plum(  . Transurethral resection of prostate    . Transurethral resection of prostate N/A 06/18/2013    Procedure: TRANSURETHRAL RESECTION OF THE PROSTATE (TURP);  Surgeon: Marissa Nestle, MD;  Location: [facility];  Service: Urology;  Laterality: N/A;  . Transthoracic echocardiogram  09/2008  EF 60-65%, mod conc LVH  . Nm myocar perf wall motion  12/2006    dipyridamole myoview - stress images show mild perfusion defect in mid inferior walls with reversibility at rest; mild perfusion defect in mid inferolateral wall at stress with mild defect reversibility, EF 62%, abnormal but low risk study    Family History  Problem Relation Age of Onset  . Colon cancer Father 73    deceased  . Prostate cancer Father   . Pancreatic cancer Mother 55    deceased   History  Substance Use Topics  . Smoking status: Former Smoker -- 8 years    Types: Cigars    Quit date: 08/10/1973  . Smokeless tobacco: Former Systems developer    Quit date: 08/10/1973  . Alcohol Use: No    Review of Systems  Musculoskeletal: Positive for arthralgias.  Skin: Positive for wound.  All other systems reviewed and are negative.   Allergies  Aspirin and  Nexium  Home Medications   Prior to Admission medications   Medication Sig Start Date End Date Taking? Authorizing Provider  acetaminophen (TYLENOL) 325 MG tablet Take 325-650 mg by mouth every 6 (six) hours as needed for pain.     Historical Provider, MD  atorvastatin (LIPITOR) 80 MG tablet Take 80 mg by mouth at bedtime.    Historical Provider, MD  cyclobenzaprine (FLEXERIL) 10 MG tablet Take 1 tablet (10 mg total) by mouth 3 (three) times daily as needed for muscle spasms. 08/31/12   Ashok Pall, MD  diclofenac sodium (VOLTAREN) 1 % GEL Apply 2 g topically 4 (four) times daily.    Historical Provider, MD  docusate sodium (COLACE) 100 MG capsule Take 100 mg by mouth daily as needed for mild constipation.     Historical Provider, MD  latanoprost (XALATAN) 0.005 % ophthalmic solution Place 1 drop into both eyes at bedtime.    Historical Provider, MD  metoprolol succinate (TOPROL-XL) 25 MG 24 hr tablet Take 1 tablet (25 mg total) by mouth every morning. 08/24/13   Theodis Blaze, MD  oxyCODONE-acetaminophen (PERCOCET) 10-325 MG per tablet Take 1 tablet by mouth every 6 (six) hours as needed for pain. 08/24/13   Theodis Blaze, MD  pantoprazole (PROTONIX) 40 MG tablet Take 40 mg by mouth 2 (two) times daily.    Historical Provider, MD  Polyethyl Glycol-Propyl Glycol (LUBRICANT EYE DROPS) 0.4-0.3 % SOLN Apply 1 drop to eye daily as needed (for lubricant eye drops).    Historical Provider, MD  potassium chloride (KLOR-CON) 20 MEQ packet Take 20 mEq by mouth 2 (two) times daily. 11/14/13   Arnoldo Lenis, MD  promethazine (PHENERGAN) 12.5 MG tablet Take 12.5 mg by mouth every 6 (six) hours as needed for nausea or vomiting.    Historical Provider, MD  timolol (TIMOPTIC-XR) 0.5 % ophthalmic gel-forming Place 1 drop into both eyes 2 (two) times daily.    Historical Provider, MD  torsemide (DEMADEX) 20 MG tablet Take 40 mg by mouth 2 (two) times daily.    Historical Provider, MD   BP 150/86  Pulse 87   Temp(Src) 97.8 F (36.6 C) (Oral)  Resp 20  Ht 5\' 9"  (1.753 m)  Wt 279 lb (126.554 kg)  BMI 41.18 kg/m2  SpO2 94% Physical Exam  Nursing note and vitals reviewed. Constitutional: He is oriented to person, place, and time. He appears well-developed and well-nourished.  HENT:  Head: Normocephalic and atraumatic.  Right Ear: External ear normal.  Left Ear: External  ear normal.  Nose: Nose normal.  No hemotympanum noted; TMs clear bilaterally Contusion with swelling to the left forehead, contusion/abrasion to the left periorbital area No TTP over the mandible, nose, or maxilla No Battles sign  Eyes: Conjunctivae and EOM are normal. Pupils are equal, round, and reactive to light.  Neck: Normal range of motion. Neck supple. No tracheal deviation present.  Cardiovascular: Normal rate, regular rhythm and normal heart sounds.  Exam reveals no gallop and no friction rub.   No murmur heard. Pulmonary/Chest: Effort normal and breath sounds normal.  Abdominal: Soft. Bowel sounds are normal. There is no tenderness.  Musculoskeletal:  Abrasion on the anterior aspect of theleft knee Left knee-He is able to move and flex his knee; pain along the medial joint line with flexion Right knee at baseline No cervical, thoracic, or lumbar tenderness to percussion Tenderness over the anterior shoulder aspect; full active ROM in both shoulders and hips -abrasion lateral upper arm Hip exam normal, no ttp, full arom  Neurological: He is alert and oriented to person, place, and time.  Skin: Skin is warm and dry.  Chronic skin changes  Psychiatric: He has a normal mood and affect. His behavior is normal. Thought content normal.    ED Course  Procedures   DIAGNOSTIC STUDIES: Oxygen Saturation is 94% on RA, low by my interpretation.    COORDINATION OF CARE: 9:48 AM Discussed treatment plan with pt at bedside and pt agreed to plan.   Labs Review Labs Reviewed  CBC WITH DIFFERENTIAL - Abnormal;  Notable for the following:    WBC 14.3 (*)    RDW 16.6 (*)    Platelets 133 (*)    Neutrophils Relative % 90 (*)    Neutro Abs 12.8 (*)    Lymphocytes Relative 8 (*)    Monocytes Relative 2 (*)    All other components within normal limits   Filed Vitals:   03/06/14 1134  BP: 136/84  Pulse: 75  Temp:   Resp: 18    Imaging Review Ct Head Wo Contrast  03/06/2014   CLINICAL DATA:  The patient tripped and fell getting out of bed this morning and hit his head on the closet door. Pain and swelling around the left eye and on the forehead.  EXAM: CT HEAD WITHOUT CONTRAST  CT MAXILLOFACIAL WITHOUT CONTRAST  TECHNIQUE: Multidetector CT imaging of the head and maxillofacial structures were performed using the standard protocol without intravenous contrast. Multiplanar CT image reconstructions of the maxillofacial structures were also generated.  COMPARISON:  CT scans dated 06/01/2011  FINDINGS: CT HEAD FINDINGS  No mass lesion. No midline shift. No acute intracranial hemorrhage or intracranial hematoma. No extra-axial fluid collections. No evidence of acute infarction. Brain parenchyma is normal. Osseous structures are normal. Small scalp hematoma over the left frontal bone.  CT MAXILLOFACIAL FINDINGS  There is no fracture, sinus opacification, or other significant abnormality. There is a small scalp hematoma over the left side of the frontal bone.  IMPRESSION: 1. Small scalp hematoma over the left side of the frontal bone. 2. Otherwise, normal exams.   Electronically Signed   By: Rozetta Nunnery M.D.   On: 03/06/2014 10:45   Dg Shoulder Left  03/06/2014   CLINICAL DATA:  Fall, history of shoulder surgery  EXAM: LEFT SHOULDER - 2+ VIEW  COMPARISON:  None.  FINDINGS: Four views of the left shoulder submitted. No acute fracture or subluxation. Postsurgical changes are noted humeral head. Moderate degenerative changes AC joint.  Glenohumeral joint is preserved.  IMPRESSION: No acute fracture or subluxation.  Postsurgical changes humeral head. Degenerative changes AC joint.   Electronically Signed   By: Lahoma Crocker M.D.   On: 03/06/2014 11:40   Dg Knee Complete 4 Views Left  03/06/2014   CLINICAL DATA:  Fall, prior knee arthroscopy  EXAM: LEFT KNEE - COMPLETE 4+ VIEW  COMPARISON:  None.  FINDINGS: Four views of left knee submitted. No acute fracture or subluxation. There is narrowing of medial joint compartment. Spurring of medial tibial plateau. Small joint effusion. Spurring of patella.  IMPRESSION: No acute fracture or subluxation. Osteoarthritic changes as described above.   Electronically Signed   By: Lahoma Crocker M.D.   On: 03/06/2014 11:43   Dg Humerus Left  03/06/2014   CLINICAL DATA:  Fall, history of shoulder surgery  EXAM: LEFT HUMERUS - 2+ VIEW  COMPARISON:  None.  FINDINGS: Three views of the left humerus submitted. No acute fracture or subluxation. Metallic anchors are noted humeral head  IMPRESSION: Negative.   Electronically Signed   By: Lahoma Crocker M.D.   On: 03/06/2014 11:42   Ct Maxillofacial Wo Cm  03/06/2014   CLINICAL DATA:  The patient tripped and fell getting out of bed this morning and hit his head on the closet door. Pain and swelling around the left eye and on the forehead.  EXAM: CT HEAD WITHOUT CONTRAST  CT MAXILLOFACIAL WITHOUT CONTRAST  TECHNIQUE: Multidetector CT imaging of the head and maxillofacial structures were performed using the standard protocol without intravenous contrast. Multiplanar CT image reconstructions of the maxillofacial structures were also generated.  COMPARISON:  CT scans dated 06/01/2011  FINDINGS: CT HEAD FINDINGS  No mass lesion. No midline shift. No acute intracranial hemorrhage or intracranial hematoma. No extra-axial fluid collections. No evidence of acute infarction. Brain parenchyma is normal. Osseous structures are normal. Small scalp hematoma over the left frontal bone.  CT MAXILLOFACIAL FINDINGS  There is no fracture, sinus opacification, or other  significant abnormality. There is a small scalp hematoma over the left side of the frontal bone.  IMPRESSION: 1. Small scalp hematoma over the left side of the frontal bone. 2. Otherwise, normal exams.   Electronically Signed   By: Rozetta Nunnery M.D.   On: 03/06/2014 10:45     MDM   Final diagnoses:  Fall, initial encounter  Facial contusion, initial encounter  Facial abrasion, initial encounter  Abrasion of left upper extremity, initial encounter  Contusion of left knee, initial encounter  Thrombocytopenia  Leukocytosis   73 y.o. Male with mechanical fall with facial abrasion and hematoma, arm abrasion and knee contusion.  CT face and head without fx or ich.  Patient with history of thrombocytopenia and platelets checked today at 133,000.  Hemoglobin normal, moderate leukocytosis likely secondary to fall- patient without fever or complainst c.w. Infect ion.  I personally performed the services described in this documentation, which was scribed in my presence. The recorded information has been reviewed and considered.   Shaune Pollack, MD 03/07/14 [phone]

## 2014-03-25 DIAGNOSIS — I739 Peripheral vascular disease, unspecified: Secondary | ICD-10-CM | POA: Diagnosis not present

## 2014-03-31 DIAGNOSIS — H4011X3 Primary open-angle glaucoma, severe stage: Secondary | ICD-10-CM | POA: Diagnosis not present

## 2014-04-09 ENCOUNTER — Encounter: Payer: Self-pay | Admitting: Cardiology

## 2014-04-09 ENCOUNTER — Ambulatory Visit (INDEPENDENT_AMBULATORY_CARE_PROVIDER_SITE_OTHER): Payer: Medicare Other | Admitting: Cardiology

## 2014-04-09 VITALS — BP 136/77 | HR 80 | Ht 69.0 in | Wt 288.0 lb

## 2014-04-09 DIAGNOSIS — E785 Hyperlipidemia, unspecified: Secondary | ICD-10-CM

## 2014-04-09 DIAGNOSIS — J0100 Acute maxillary sinusitis, unspecified: Secondary | ICD-10-CM | POA: Diagnosis not present

## 2014-04-09 DIAGNOSIS — Z6841 Body Mass Index (BMI) 40.0 and over, adult: Secondary | ICD-10-CM | POA: Diagnosis not present

## 2014-04-09 DIAGNOSIS — I5032 Chronic diastolic (congestive) heart failure: Secondary | ICD-10-CM | POA: Diagnosis not present

## 2014-04-09 DIAGNOSIS — G4733 Obstructive sleep apnea (adult) (pediatric): Secondary | ICD-10-CM | POA: Diagnosis not present

## 2014-04-09 DIAGNOSIS — I2510 Atherosclerotic heart disease of native coronary artery without angina pectoris: Secondary | ICD-10-CM | POA: Diagnosis not present

## 2014-04-09 DIAGNOSIS — G894 Chronic pain syndrome: Secondary | ICD-10-CM | POA: Diagnosis not present

## 2014-04-09 DIAGNOSIS — I10 Essential (primary) hypertension: Secondary | ICD-10-CM | POA: Diagnosis not present

## 2014-04-09 NOTE — Progress Notes (Signed)
Clinical Summary [name] [name] is a 73 y.o.male seen today for follow up of the following medical problems.  1. Chronic diastolic heart failure  - echo 04/2013 LVEF 82-42%, grade I diastolic dysfunction. RV was poorly visualized.  - previously volume status was labile, admissions with volume overload and dehydration. Has done well on torsemide 40mg  bid, his weights remain stable in the low 280s. He adjusts he diuretic dosing based on daily weight changes. - denies any significant DOE or LE edema. He is limiting sodium intake.  2. OSA - patient reports compliance with CPAP  3. Hyperlipidemia - compliant with statin    Past Medical History  Diagnosis Date  . CHF (congestive heart failure)   . Obesity   . Hyperlipidemia   . PUD (peptic ulcer disease)   . GERD (gastroesophageal reflux disease)   . S/P endoscopy 2007, 2012    2007: nl, May 2012: antral erosions  . Osteoarthritis   . S/P colonoscopy 2007, Feb and March 2012    hx of adenomas, left-sided diverticula, On 07/2010 TCS, fresh blood and clot noted coming from TI.  Marland Kitchen Anxiety   . Hypothyroidism     hypothyroid  . Ringing in ears   . History of kidney stones   . History of bladder infections   . Heel spur     right heel  . Collagen vascular disease     history of venous insufficency and LE cellulitis  . Thrombocytopenia 12/30/2011    Stable  . BPH (benign prostatic hyperplasia)   . Hypertension     Sees Dr. Debara Pickett  . Bilateral lower leg cellulitis   . Sleep apnea     uses oxygen 2L via [place] cannot tolerate CPAP - sleep study 2008 AHI 48.80/hr and 56.70hr during REM  . Complication of anesthesia     due to sleep apnea pt has had difficulty being put under anesthesia  . PONV (postoperative nausea and vomiting)   . Deep venous insufficiency   . Chronic diastolic HF (heart failure)      Allergies  Allergen Reactions  . Aspirin Other (See Comments)    Nausea and upset stomach    . Nexium [Esomeprazole  Magnesium] Other (See Comments)    Patient said it messed his stomach up     Current Outpatient Prescriptions  Medication Sig Dispense Refill  . acetaminophen (TYLENOL) 325 MG tablet Take 325-650 mg by mouth every 6 (six) hours as needed for pain.     Marland Kitchen atorvastatin (LIPITOR) 80 MG tablet Take 80 mg by mouth at bedtime.    . cyclobenzaprine (FLEXERIL) 10 MG tablet Take 1 tablet (10 mg total) by mouth 3 (three) times daily as needed for muscle spasms. 45 tablet 0  . docusate sodium (COLACE) 100 MG capsule Take 100 mg by mouth daily as needed for mild constipation.     Marland Kitchen latanoprost (XALATAN) 0.005 % ophthalmic solution Place 1 drop into both eyes at bedtime.    . metoprolol succinate (TOPROL-XL) 25 MG 24 hr tablet Take 25 mg by mouth daily.    Marland Kitchen oxyCODONE-acetaminophen (PERCOCET) 10-325 MG per tablet Take 1 tablet by mouth every 6 (six) hours as needed for pain. 90 tablet 0  . pantoprazole (PROTONIX) 40 MG tablet Take 40 mg by mouth 2 (two) times daily.    Vladimir Faster Glycol-Propyl Glycol (LUBRICANT EYE DROPS) 0.4-0.3 % SOLN Apply 1 drop to eye daily as needed (for lubricant eye drops).    . potassium  chloride (KLOR-CON) 20 MEQ packet Take 20 mEq by mouth 2 (two) times daily.    . promethazine (PHENERGAN) 12.5 MG tablet Take 12.5 mg by mouth every 6 (six) hours as needed for nausea or vomiting.    . timolol (BETIMOL) 0.5 % ophthalmic solution Place 1 drop into the left eye 2 (two) times daily.    Marland Kitchen torsemide (DEMADEX) 20 MG tablet Take 40 mg by mouth 2 (two) times daily.     No current facility-administered medications for this visit.     Past Surgical History  Procedure Laterality Date  . Knee arthroscopy    . Rotator cuff repair    . Cholecystectomy    . Lumbar spine surgery    . Small bowel capsule study  07/2011    ?transient focal ischemia in distal small bowel to explain GI bleeding  . Esophagogastroduodenoscopy  Aug 2013    [facility], single 41mm pedunculated polyp otherwise normal.  HYPERPLASTIC, NEGATIVE h.pylori  . Cardiac catheterization  02/27/2007    no sign of CAD, LVH, mod pulm HTN (Dr. Jackie Plum(  . Transurethral resection of prostate    . Transurethral resection of prostate N/A 06/18/2013    Procedure: TRANSURETHRAL RESECTION OF THE PROSTATE (TURP);  Surgeon: Marissa Nestle, MD;  Location: [facility];  Service: Urology;  Laterality: N/A;  . Transthoracic echocardiogram  09/2008    EF 60-65%, mod conc LVH  . Nm myocar perf wall motion  12/2006    dipyridamole myoview - stress images show mild perfusion defect in mid inferior walls with reversibility at rest; mild perfusion defect in mid inferolateral wall at stress with mild defect reversibility, EF 62%, abnormal but low risk study      Allergies  Allergen Reactions  . Aspirin Other (See Comments)    Nausea and upset stomach    . Nexium [Esomeprazole Magnesium] Other (See Comments)    Patient said it messed his stomach up      Family History  Problem Relation Age of Onset  . Colon cancer Father 54    deceased  . Prostate cancer Father   . Pancreatic cancer Mother 13    deceased     Social History [name] [name] reports that he quit smoking about 40 years ago. His smoking use included Cigars. He quit smokeless tobacco use about 40 years ago. [name] [name] reports that he does not drink alcohol.   Review of Systems CONSTITUTIONAL: No weight loss, fever, chills, weakness or fatigue.  HEENT: Eyes: No visual loss, blurred vision, double vision or yellow sclerae.No hearing loss, sneezing, congestion, runny nose or sore throat.  SKIN: No rash or itching.  CARDIOVASCULAR: per HPI RESPIRATORY: No shortness of breath, cough or sputum.  GASTROINTESTINAL: No anorexia, nausea, vomiting or diarrhea. No abdominal pain or blood.  GENITOURINARY: No burning on urination, no polyuria NEUROLOGICAL: No headache, dizziness, syncope, paralysis, ataxia, numbness or tingling in the extremities. No change in bowel or bladder  control.  MUSCULOSKELETAL: No muscle, back pain, joint pain or stiffness.  LYMPHATICS: No enlarged nodes. No history of splenectomy.  PSYCHIATRIC: No history of depression or anxiety.  ENDOCRINOLOGIC: No reports of sweating, cold or heat intolerance. No polyuria or polydipsia.  Marland Kitchen   Physical Examination p 80 bp 136/77 Wt 288 lbs BMI 43 Gen: resting comfortably, no acute distress HEENT: no scleral icterus, pupils equal round and reactive, no palptable cervical adenopathy,  CV: RRR, no m/r/g, no JVD, no carotid bruits Resp: Clear to auscultation bilaterally GI: abdomen  is soft, non-tender, non-distended, normal bowel sounds, no hepatosplenomegaly MSK: extremities are warm, no edema.  Skin: warm, no rash Neuro:  no focal deficits Psych: appropriate affect   Diagnostic Studies 04/2013 Echo Study Conclusions  - Procedure narrative: Transthoracic echocardiography. Technically difficult study with reduced echocardiographic windows. - Left ventricle: The cavity size was normal. There was moderate concentric hypertrophy. Systolic function was vigorous. The estimated ejection fraction was in the range of 65% to 70%. Wall motion was normal; there were no regional wall motion abnormalities. Doppler parameters are consistent with abnormal left ventricular relaxation (grade 1 diastolic dysfunction). The E/e' ratio is <10, suggesting normal LV filling pressure. - Left atrium: The atrium was normal in size. - Tricuspid valve: Mild regurgitation. - Pericardium, extracardiac: There was no pericardial effusion.    Assessment and Plan   1. Chronic diastolic heart failure  - labile volume status over the last few months, with admissions for volume overload and dehydration. Recently has stabilized on current dose of diuretics, toresmide 40mg  bid. Appears euvolemic today. - continue current therapy.    2. OSA - continue CPAP  3. Hyperlipidemia - continue current statin, will request  most recent panel from pcp  F/u 4 months     Arnoldo Lenis, M.D.

## 2014-04-09 NOTE — Patient Instructions (Signed)

## 2014-04-15 ENCOUNTER — Ambulatory Visit (INDEPENDENT_AMBULATORY_CARE_PROVIDER_SITE_OTHER): Payer: Medicare Other | Admitting: Urology

## 2014-04-15 DIAGNOSIS — N3281 Overactive bladder: Secondary | ICD-10-CM | POA: Diagnosis not present

## 2014-04-15 DIAGNOSIS — N400 Enlarged prostate without lower urinary tract symptoms: Secondary | ICD-10-CM | POA: Diagnosis not present

## 2014-04-21 ENCOUNTER — Telehealth: Payer: Self-pay | Admitting: Internal Medicine

## 2014-04-21 NOTE — Telephone Encounter (Signed)
PATIENT HAD ONE EPISODE OF BLOOD IN STOOL AND IS HAVING NAUSEA.  MADE APPT FIRST AVAILABLE IN January.  IS THERE ANYTHING HE CAN TAKE FOR NAUSEA UNTIL THEN  PLEASE ADVISE.

## 2014-04-22 ENCOUNTER — Telehealth: Payer: Self-pay | Admitting: Internal Medicine

## 2014-04-22 NOTE — Telephone Encounter (Signed)
He hasn't been seen in over 2 years.   Make sure he is taking a PPI. If there is a cancellation, let's offer him that appt. See PCP in interim to see if there is a need to be seen more urgently.

## 2014-04-22 NOTE — Telephone Encounter (Signed)
Patient has called back again today asking to speak with nurse about getting seen sooner or what he needed to do before his OV in January. He said the pain was getting worse.

## 2014-04-22 NOTE — Telephone Encounter (Signed)
[name] [name] spoke with the pt. See other phone note.

## 2014-04-22 NOTE — Telephone Encounter (Signed)
Talked with him and he had some bleeding last week but it also stop the same day. He is having some pain in the upper part of his stomach and it makes his nausea, His pain level is about a 5-6 now. He is wanting to know if he can be seen sooner the January. Please advise

## 2014-04-23 NOTE — Telephone Encounter (Signed)
[name] [name], can you get the patient in if you have a cancellation?

## 2014-04-23 NOTE — Telephone Encounter (Signed)
He is on the cancel list in case someone calls

## 2014-05-02 ENCOUNTER — Ambulatory Visit (INDEPENDENT_AMBULATORY_CARE_PROVIDER_SITE_OTHER): Payer: Medicare Other | Admitting: Internal Medicine

## 2014-05-02 ENCOUNTER — Encounter: Payer: Self-pay | Admitting: Internal Medicine

## 2014-05-02 ENCOUNTER — Other Ambulatory Visit: Payer: Self-pay

## 2014-05-02 VITALS — BP 153/79 | HR 86 | Temp 97.0°F | Ht 68.0 in | Wt 287.4 lb

## 2014-05-02 DIAGNOSIS — R6881 Early satiety: Secondary | ICD-10-CM

## 2014-05-02 DIAGNOSIS — K921 Melena: Secondary | ICD-10-CM

## 2014-05-02 DIAGNOSIS — R1314 Dysphagia, pharyngoesophageal phase: Secondary | ICD-10-CM

## 2014-05-02 DIAGNOSIS — R110 Nausea: Secondary | ICD-10-CM | POA: Diagnosis not present

## 2014-05-02 DIAGNOSIS — I2510 Atherosclerotic heart disease of native coronary artery without angina pectoris: Secondary | ICD-10-CM | POA: Diagnosis not present

## 2014-05-02 MED ORDER — PEG-KCL-NACL-NASULF-NA ASC-C 100 G PO SOLR: 1.0000 | Freq: Once | ORAL | Status: AC

## 2014-05-02 NOTE — Patient Instructions (Signed)
Schedule EGD with esophageal dilation - for early satiety and nausea  Schedule colonoscopy (diagnostic for hematochezia)  Split Movie prep

## 2014-05-02 NOTE — Progress Notes (Signed)
  Primary Care Physician:  FUSCO,LAWRENCE J., MD Primary Gastroenterologist:  Dr. Kinsly Hild  Pre-Procedure History & Physical: HPI:  [name] [name] is a 73 y.o. male here for an episode of hematochezia a couple of weeks ago. Painless -  not associated with straining, constipation or diarrhea. States bowel function otherwise normal. Also,  complains of worsening of "nausea" - no vomiting. Patient describes intermittent esophageal dysphagia. No weight loss. No tobacco or alcohol exposure. Prior extensive evaluation with EGD, colonoscopy and capsule study along with gastric emptying study all done here. Evaluated at [facility] as well. They raised the possibility of a small bowel bacterial overgrowth. They recommended antibiotics. I am not sure whether or not he took any antibiotics (patient also uncertain).  He tells me he did the best when he took Protonix and a probiotic daily. He's taking neither at this time.  Past Medical History  Diagnosis Date  . CHF (congestive heart failure)   . Obesity   . Hyperlipidemia   . PUD (peptic ulcer disease)   . GERD (gastroesophageal reflux disease)   . S/P endoscopy 2007, 2012    2007: nl, May 2012: antral erosions  . Osteoarthritis   . S/P colonoscopy 2007, Feb and March 2012    hx of adenomas, left-sided diverticula, On 07/2010 TCS, fresh blood and clot noted coming from TI.  . Anxiety   . Hypothyroidism     hypothyroid  . Ringing in ears   . History of kidney stones   . History of bladder infections   . Heel spur     right heel  . Collagen vascular disease     history of venous insufficency and LE cellulitis  . Thrombocytopenia 12/30/2011    Stable  . BPH (benign prostatic hyperplasia)   . Hypertension     Sees Dr. Hilty  . Bilateral lower leg cellulitis   . Sleep apnea     uses oxygen 2L via [place] cannot tolerate CPAP - sleep study 2008 AHI 48.80/hr and 56.70hr during REM  . Complication of anesthesia     due to sleep apnea pt has had difficulty  being put under anesthesia  . PONV (postoperative nausea and vomiting)   . Deep venous insufficiency   . Chronic diastolic HF (heart failure)     Past Surgical History  Procedure Laterality Date  . Knee arthroscopy    . Rotator cuff repair    . Cholecystectomy    . Lumbar spine surgery    . Small bowel capsule study  07/2011    ?transient focal ischemia in distal small bowel to explain GI bleeding  . Esophagogastroduodenoscopy  Aug 2013    [facility], single 5mm pedunculated polyp otherwise normal. HYPERPLASTIC, NEGATIVE h.pylori  . Cardiac catheterization  02/27/2007    no sign of CAD, LVH, mod pulm HTN (Dr. J. Gangi(  . Transurethral resection of prostate    . Transurethral resection of prostate N/A 06/18/2013    Procedure: TRANSURETHRAL RESECTION OF THE PROSTATE (TURP);  Surgeon: Mohammad I Javaid, MD;  Location: [facility];  Service: Urology;  Laterality: N/A;  . Transthoracic echocardiogram  09/2008    EF 60-65%, mod conc LVH  . Nm myocar perf wall motion  12/2006    dipyridamole myoview - stress images show mild perfusion defect in mid inferior walls with reversibility at rest; mild perfusion defect in mid inferolateral wall at stress with mild defect reversibility, EF 62%, abnormal but low risk study     Prior to Admission medications     Medication Sig Start Date End Date Taking? Authorizing Provider  acetaminophen (TYLENOL) 325 MG tablet Take 325-650 mg by mouth every 6 (six) hours as needed for pain.    Yes Historical Provider, MD  cyclobenzaprine (FLEXERIL) 10 MG tablet Take 1 tablet (10 mg total) by mouth 3 (three) times daily as needed for muscle spasms. 08/31/12  Yes Kyle Cabbell, MD  docusate sodium (COLACE) 100 MG capsule Take 100 mg by mouth daily as needed for mild constipation.    Yes Historical Provider, MD  latanoprost (XALATAN) 0.005 % ophthalmic solution Place 1 drop into both eyes at bedtime.   Yes Historical Provider, MD  metoprolol succinate (TOPROL-XL) 25 MG 24 hr tablet  Take 25 mg by mouth daily.   Yes Historical Provider, MD  mirabegron ER (MYRBETRIQ) 25 MG TB24 tablet Take 25 mg by mouth daily.   Yes Historical Provider, MD  oxyCODONE-acetaminophen (PERCOCET) 10-325 MG per tablet Take 1 tablet by mouth every 6 (six) hours as needed for pain. 08/24/13  Yes Iskra M Myers, MD  pantoprazole (PROTONIX) 40 MG tablet Take 40 mg by mouth 2 (two) times daily.   Yes Historical Provider, MD  Polyethyl Glycol-Propyl Glycol (LUBRICANT EYE DROPS) 0.4-0.3 % SOLN Apply 1 drop to eye daily as needed (for lubricant eye drops).   Yes Historical Provider, MD  potassium chloride (KLOR-CON) 20 MEQ packet Take 20 mEq by mouth 2 (two) times daily. 11/14/13  Yes Jonathan F Branch, MD  promethazine (PHENERGAN) 12.5 MG tablet Take 12.5 mg by mouth every 6 (six) hours as needed for nausea or vomiting.   Yes Historical Provider, MD  timolol (BETIMOL) 0.5 % ophthalmic solution Place 1 drop into the left eye 2 (two) times daily.   Yes Historical Provider, MD  torsemide (DEMADEX) 20 MG tablet Take 40 mg by mouth 2 (two) times daily.   Yes Historical Provider, MD    Allergies as of 05/02/2014 - Review Complete 05/02/2014  Allergen Reaction Noted  . Aspirin Other (See Comments)   . Nexium [esomeprazole magnesium] Other (See Comments) 11/14/2013    Family History  Problem Relation Age of Onset  . Colon cancer Father 68    deceased  . Prostate cancer Father   . Pancreatic cancer Mother 58    deceased    History   Social History  . Marital Status: Married    Spouse Name: LaRuth    Number of Children: 5  . Years of Education: College   Occupational History  . disability    Social History Main Topics  . Smoking status: Former Smoker -- 8 years    Types: Cigars    Start date: 11/20/1959    Quit date: 08/10/1973  . Smokeless tobacco: Never Used  . Alcohol Use: No  . Drug Use: No  . Sexual Activity: No   Other Topics Concern  . Not on file   Social History Narrative    Patient lives at home with spouse and son.   Caffeine Use: 2 cups weekly    Review of Systems: See HPI, otherwise negative ROS  Physical Exam: BP 153/79 mmHg  Pulse 86  Temp(Src) 97 F (36.1 C) (Oral)  Ht 5' 8" (1.727 m)  Wt 287 lb 6.4 oz (130.364 kg)  BMI 43.71 kg/m2 General:   Alert,  Morbidly obese gentleman resting comfortably. Skin:  Intact without significant lesions or rashes. Eyes:  Sclera clear, no icterus.   Bilateral xanthelasma present. Conjunctiva pink. Ears:  Normal auditory acuity. Nose:    No deformity, discharge,  or lesions. Mouth:  No deformity or lesions. Neck:  Supple; no masses or thyromegaly. No significant cervical adenopathy. Lungs:  Clear throughout to auscultation.   No wheezes, crackles, or rhonchi. No acute distress. Heart:  Regular rate and rhythm; no murmurs, clicks, rubs,  or gallops. Abdomen: Massively obese. Positive bowel sounds. Soft and nontender without obvious mass or organomegaly.  Pulses:  Normal pulses noted. Extremities:  1+ brawny lower extremity edema.  Impression:  Pleasant 73-year-old gentleman with multiple medical problems presents with a single episode of painless hematochezia. He notes" nausea" localized to his retroxiphoid area that is worse in the mornings. No exertional component. Vague intermittent esophageal dysphagia. No longer on a PPI. Denies vomiting. No frank abdominal pain. No change in weight. No melena. He was doing very well previously on a PPI chronically and probiotic. There's a question of small bowel bacterial overgrowth raised at [facility] previously. I'm not sure whether or not he took antibiotics previously Prior gastric imaging study within normal limits. It's been now a few years since he had an endoscopic evaluation.   Recommendations:  Given his symptoms, I feel that an EGD and diagnostic colonoscopy are warranted. Potential for esophageal dilation reviewed. I discussed this approach with the patient.The risks,  benefits, limitations, imponderables and alternatives regarding both EGD and colonoscopy have been reviewed with the patient. Questions have been answered. All parties agreeable.     Notice: This dictation was prepared with Dragon dictation along with smaller phrase technology. Any transcriptional errors that result from this process are unintentional and may not be corrected upon review. 

## 2014-05-05 ENCOUNTER — Telehealth: Payer: Self-pay

## 2014-05-05 NOTE — Telephone Encounter (Signed)
LMOM for pt to be at [facility] at 830am on the day of his procedure(05/28/2014)

## 2014-05-06 ENCOUNTER — Telehealth: Payer: Self-pay

## 2014-05-06 NOTE — Telephone Encounter (Signed)
Pt called to verify where he needed to pick up a fleet enema and to confirm his arrival at the hospital on the day of his procedure /05/28/2014. Confirmed 8:30am arrival.

## 2014-05-28 ENCOUNTER — Encounter (HOSPITAL_COMMUNITY): Payer: Self-pay

## 2014-05-28 ENCOUNTER — Encounter (HOSPITAL_COMMUNITY)
Admission: RE | Disposition: A | Discharge status: Home / Self Care | Payer: Self-pay | Source: Ambulatory Visit | Attending: Internal Medicine

## 2014-05-28 ENCOUNTER — Ambulatory Visit (HOSPITAL_COMMUNITY)
Admission: RE | Admit: 2014-05-28 | Discharge: 2014-05-28 | Disposition: A | Discharge status: Home / Self Care | Payer: Medicare Other | Source: Ambulatory Visit | Attending: Internal Medicine | Admitting: Internal Medicine

## 2014-05-28 DIAGNOSIS — R110 Nausea: Secondary | ICD-10-CM | POA: Diagnosis present

## 2014-05-28 DIAGNOSIS — Z8711 Personal history of peptic ulcer disease: Secondary | ICD-10-CM | POA: Diagnosis not present

## 2014-05-28 DIAGNOSIS — I509 Heart failure, unspecified: Secondary | ICD-10-CM | POA: Insufficient documentation

## 2014-05-28 DIAGNOSIS — D696 Thrombocytopenia, unspecified: Secondary | ICD-10-CM | POA: Diagnosis not present

## 2014-05-28 DIAGNOSIS — E669 Obesity, unspecified: Secondary | ICD-10-CM | POA: Insufficient documentation

## 2014-05-28 DIAGNOSIS — K449 Diaphragmatic hernia without obstruction or gangrene: Secondary | ICD-10-CM | POA: Diagnosis not present

## 2014-05-28 DIAGNOSIS — K219 Gastro-esophageal reflux disease without esophagitis: Secondary | ICD-10-CM | POA: Insufficient documentation

## 2014-05-28 DIAGNOSIS — Z860100 Personal history of colon polyps, unspecified: Secondary | ICD-10-CM | POA: Insufficient documentation

## 2014-05-28 DIAGNOSIS — G4730 Sleep apnea, unspecified: Secondary | ICD-10-CM | POA: Diagnosis not present

## 2014-05-28 DIAGNOSIS — K259 Gastric ulcer, unspecified as acute or chronic, without hemorrhage or perforation: Secondary | ICD-10-CM | POA: Insufficient documentation

## 2014-05-28 DIAGNOSIS — K635 Polyp of colon: Secondary | ICD-10-CM | POA: Diagnosis not present

## 2014-05-28 DIAGNOSIS — E785 Hyperlipidemia, unspecified: Secondary | ICD-10-CM | POA: Insufficient documentation

## 2014-05-28 DIAGNOSIS — Z87442 Personal history of urinary calculi: Secondary | ICD-10-CM | POA: Diagnosis not present

## 2014-05-28 DIAGNOSIS — K921 Melena: Secondary | ICD-10-CM | POA: Diagnosis present

## 2014-05-28 DIAGNOSIS — Z87891 Personal history of nicotine dependence: Secondary | ICD-10-CM | POA: Insufficient documentation

## 2014-05-28 DIAGNOSIS — D123 Benign neoplasm of transverse colon: Secondary | ICD-10-CM | POA: Diagnosis not present

## 2014-05-28 DIAGNOSIS — K5730 Diverticulosis of large intestine without perforation or abscess without bleeding: Secondary | ICD-10-CM | POA: Insufficient documentation

## 2014-05-28 DIAGNOSIS — Z9889 Other specified postprocedural states: Secondary | ICD-10-CM | POA: Insufficient documentation

## 2014-05-28 DIAGNOSIS — K648 Other hemorrhoids: Secondary | ICD-10-CM | POA: Insufficient documentation

## 2014-05-28 DIAGNOSIS — R1314 Dysphagia, pharyngoesophageal phase: Secondary | ICD-10-CM | POA: Insufficient documentation

## 2014-05-28 DIAGNOSIS — I10 Essential (primary) hypertension: Secondary | ICD-10-CM | POA: Insufficient documentation

## 2014-05-28 DIAGNOSIS — D124 Benign neoplasm of descending colon: Secondary | ICD-10-CM | POA: Insufficient documentation

## 2014-05-28 DIAGNOSIS — I5032 Chronic diastolic (congestive) heart failure: Secondary | ICD-10-CM | POA: Insufficient documentation

## 2014-05-28 DIAGNOSIS — R6881 Early satiety: Secondary | ICD-10-CM

## 2014-05-28 DIAGNOSIS — M1990 Unspecified osteoarthritis, unspecified site: Secondary | ICD-10-CM | POA: Diagnosis not present

## 2014-05-28 DIAGNOSIS — E039 Hypothyroidism, unspecified: Secondary | ICD-10-CM | POA: Diagnosis not present

## 2014-05-28 DIAGNOSIS — Z86718 Personal history of other venous thrombosis and embolism: Secondary | ICD-10-CM | POA: Insufficient documentation

## 2014-05-28 DIAGNOSIS — K2950 Unspecified chronic gastritis without bleeding: Secondary | ICD-10-CM | POA: Diagnosis not present

## 2014-05-28 DIAGNOSIS — Z6841 Body Mass Index (BMI) 40.0 and over, adult: Secondary | ICD-10-CM | POA: Diagnosis not present

## 2014-05-28 DIAGNOSIS — N400 Enlarged prostate without lower urinary tract symptoms: Secondary | ICD-10-CM | POA: Diagnosis not present

## 2014-05-28 DIAGNOSIS — K222 Esophageal obstruction: Secondary | ICD-10-CM | POA: Diagnosis not present

## 2014-05-28 DIAGNOSIS — Z79899 Other long term (current) drug therapy: Secondary | ICD-10-CM | POA: Insufficient documentation

## 2014-05-28 HISTORY — PX: ESOPHAGOGASTRODUODENOSCOPY: SHX5428

## 2014-05-28 HISTORY — PX: SAVORY DILATION: SHX5439

## 2014-05-28 HISTORY — PX: COLONOSCOPY: SHX5424

## 2014-05-28 HISTORY — PX: MALONEY DILATION: SHX5535

## 2014-05-28 SURGERY — EGD (ESOPHAGOGASTRODUODENOSCOPY)
Anesthesia: Moderate Sedation

## 2014-05-28 MED ORDER — MEPERIDINE HCL 100 MG/ML IJ SOLN
INTRAMUSCULAR | Status: AC
  Filled 2014-05-28: qty 2

## 2014-05-28 MED ORDER — LIDOCAINE VISCOUS 2 % MT SOLN
OROMUCOSAL | Status: DC | PRN
  Administered 2014-05-28: 4 mL via OROMUCOSAL

## 2014-05-28 MED ORDER — MIDAZOLAM HCL 5 MG/5ML IJ SOLN
INTRAMUSCULAR | Status: AC
  Filled 2014-05-28: qty 10

## 2014-05-28 MED ORDER — ONDANSETRON HCL 4 MG/2ML IJ SOLN
INTRAMUSCULAR | Status: DC | PRN
  Administered 2014-05-28: 4 mg via INTRAVENOUS

## 2014-05-28 MED ORDER — MIDAZOLAM HCL 5 MG/5ML IJ SOLN
INTRAMUSCULAR | Status: DC | PRN
  Administered 2014-05-28: 1 mg via INTRAVENOUS
  Administered 2014-05-28: 2 mg via INTRAVENOUS

## 2014-05-28 MED ORDER — MEPERIDINE HCL 100 MG/ML IJ SOLN
INTRAMUSCULAR | Status: DC | PRN
  Administered 2014-05-28 (×2): 25 mg

## 2014-05-28 MED ORDER — SODIUM CHLORIDE 0.9 % IV SOLN
INTRAVENOUS | Status: DC
  Administered 2014-05-28: 09:00:00 via INTRAVENOUS

## 2014-05-28 MED ORDER — LIDOCAINE VISCOUS 2 % MT SOLN
OROMUCOSAL | Status: AC
  Filled 2014-05-28: qty 15

## 2014-05-28 MED ORDER — ONDANSETRON HCL 4 MG/2ML IJ SOLN
INTRAMUSCULAR | Status: AC
  Filled 2014-05-28: qty 2

## 2014-05-28 NOTE — H&P (View-Only) (Signed)
Primary Care Physician:  [name]., MD Primary Gastroenterologist:  Dr. Gala Romney  Pre-Procedure History & Physical: HPI:  [name] [name] is a 74 y.o. male here for an episode of hematochezia a couple of weeks ago. Painless -  not associated with straining, constipation or diarrhea. States bowel function otherwise normal. Also,  complains of worsening of "nausea" - no vomiting. Patient describes intermittent esophageal dysphagia. No weight loss. No tobacco or alcohol exposure. Prior extensive evaluation with EGD, colonoscopy and capsule study along with gastric emptying study all done here. Evaluated at [facility] as well. They raised the possibility of a small bowel bacterial overgrowth. They recommended antibiotics. I am not sure whether or not he took any antibiotics (patient also uncertain).  He tells me he did the best when he took Protonix and a probiotic daily. He's taking neither at this time.  Past Medical History  Diagnosis Date  . CHF (congestive heart failure)   . Obesity   . Hyperlipidemia   . PUD (peptic ulcer disease)   . GERD (gastroesophageal reflux disease)   . S/P endoscopy 2007, 2012    2007: nl, May 2012: antral erosions  . Osteoarthritis   . S/P colonoscopy 2007, Feb and March 2012    hx of adenomas, left-sided diverticula, On 07/2010 TCS, fresh blood and clot noted coming from TI.  Marland Kitchen Anxiety   . Hypothyroidism     hypothyroid  . Ringing in ears   . History of kidney stones   . History of bladder infections   . Heel spur     right heel  . Collagen vascular disease     history of venous insufficency and LE cellulitis  . Thrombocytopenia 12/30/2011    Stable  . BPH (benign prostatic hyperplasia)   . Hypertension     Sees Dr. Debara Pickett  . Bilateral lower leg cellulitis   . Sleep apnea     uses oxygen 2L via [place] cannot tolerate CPAP - sleep study 2008 AHI 48.80/hr and 56.70hr during REM  . Complication of anesthesia     due to sleep apnea pt has had difficulty  being put under anesthesia  . PONV (postoperative nausea and vomiting)   . Deep venous insufficiency   . Chronic diastolic HF (heart failure)     Past Surgical History  Procedure Laterality Date  . Knee arthroscopy    . Rotator cuff repair    . Cholecystectomy    . Lumbar spine surgery    . Small bowel capsule study  07/2011    ?transient focal ischemia in distal small bowel to explain GI bleeding  . Esophagogastroduodenoscopy  Aug 2013    [facility], single 67mm pedunculated polyp otherwise normal. HYPERPLASTIC, NEGATIVE h.pylori  . Cardiac catheterization  02/27/2007    no sign of CAD, LVH, mod pulm HTN (Dr. Jackie Plum(  . Transurethral resection of prostate    . Transurethral resection of prostate N/A 06/18/2013    Procedure: TRANSURETHRAL RESECTION OF THE PROSTATE (TURP);  Surgeon: Marissa Nestle, MD;  Location: [facility];  Service: Urology;  Laterality: N/A;  . Transthoracic echocardiogram  09/2008    EF 60-65%, mod conc LVH  . Nm myocar perf wall motion  12/2006    dipyridamole myoview - stress images show mild perfusion defect in mid inferior walls with reversibility at rest; mild perfusion defect in mid inferolateral wall at stress with mild defect reversibility, EF 62%, abnormal but low risk study     Prior to Admission medications  Medication Sig Start Date End Date Taking? Authorizing Provider  acetaminophen (TYLENOL) 325 MG tablet Take 325-650 mg by mouth every 6 (six) hours as needed for pain.    Yes Historical Provider, MD  cyclobenzaprine (FLEXERIL) 10 MG tablet Take 1 tablet (10 mg total) by mouth 3 (three) times daily as needed for muscle spasms. 08/31/12  Yes Ashok Pall, MD  docusate sodium (COLACE) 100 MG capsule Take 100 mg by mouth daily as needed for mild constipation.    Yes Historical Provider, MD  latanoprost (XALATAN) 0.005 % ophthalmic solution Place 1 drop into both eyes at bedtime.   Yes Historical Provider, MD  metoprolol succinate (TOPROL-XL) 25 MG 24 hr tablet  Take 25 mg by mouth daily.   Yes Historical Provider, MD  mirabegron ER (MYRBETRIQ) 25 MG TB24 tablet Take 25 mg by mouth daily.   Yes Historical Provider, MD  oxyCODONE-acetaminophen (PERCOCET) 10-325 MG per tablet Take 1 tablet by mouth every 6 (six) hours as needed for pain. 08/24/13  Yes Theodis Blaze, MD  pantoprazole (PROTONIX) 40 MG tablet Take 40 mg by mouth 2 (two) times daily.   Yes Historical Provider, MD  Polyethyl Glycol-Propyl Glycol (LUBRICANT EYE DROPS) 0.4-0.3 % SOLN Apply 1 drop to eye daily as needed (for lubricant eye drops).   Yes Historical Provider, MD  potassium chloride (KLOR-CON) 20 MEQ packet Take 20 mEq by mouth 2 (two) times daily. 11/14/13  Yes Arnoldo Lenis, MD  promethazine (PHENERGAN) 12.5 MG tablet Take 12.5 mg by mouth every 6 (six) hours as needed for nausea or vomiting.   Yes Historical Provider, MD  timolol (BETIMOL) 0.5 % ophthalmic solution Place 1 drop into the left eye 2 (two) times daily.   Yes Historical Provider, MD  torsemide (DEMADEX) 20 MG tablet Take 40 mg by mouth 2 (two) times daily.   Yes Historical Provider, MD    Allergies as of 05/02/2014 - Review Complete 05/02/2014  Allergen Reaction Noted  . Aspirin Other (See Comments)   . Nexium [esomeprazole magnesium] Other (See Comments) 11/14/2013    Family History  Problem Relation Age of Onset  . Colon cancer Father 69    deceased  . Prostate cancer Father   . Pancreatic cancer Mother 52    deceased    History   Social History  . Marital Status: Married    Spouse Name: LaRuth    Number of Children: 5  . Years of Education: College   Occupational History  . disability    Social History Main Topics  . Smoking status: Former Smoker -- 8 years    Types: Cigars    Start date: 11/20/1959    Quit date: 08/10/1973  . Smokeless tobacco: Never Used  . Alcohol Use: No  . Drug Use: No  . Sexual Activity: No   Other Topics Concern  . Not on file   Social History Narrative    Patient lives at home with spouse and son.   Caffeine Use: 2 cups weekly    Review of Systems: See HPI, otherwise negative ROS  Physical Exam: BP 153/79 mmHg  Pulse 86  Temp(Src) 97 F (36.1 C) (Oral)  Ht 5\' 8"  (1.727 m)  Wt 287 lb 6.4 oz (130.364 kg)  BMI 43.71 kg/m2 General:   Alert,  Morbidly obese gentleman resting comfortably. Skin:  Intact without significant lesions or rashes. Eyes:  Sclera clear, no icterus.   Bilateral xanthelasma present. Conjunctiva pink. Ears:  Normal auditory acuity. Nose:  No deformity, discharge,  or lesions. Mouth:  No deformity or lesions. Neck:  Supple; no masses or thyromegaly. No significant cervical adenopathy. Lungs:  Clear throughout to auscultation.   No wheezes, crackles, or rhonchi. No acute distress. Heart:  Regular rate and rhythm; no murmurs, clicks, rubs,  or gallops. Abdomen: Massively obese. Positive bowel sounds. Soft and nontender without obvious mass or organomegaly.  Pulses:  Normal pulses noted. Extremities:  1+ brawny lower extremity edema.  Impression:  Pleasant 74 year old gentleman with multiple medical problems presents with a single episode of painless hematochezia. He notes" nausea" localized to his retroxiphoid area that is worse in the mornings. No exertional component. Vague intermittent esophageal dysphagia. No longer on a PPI. Denies vomiting. No frank abdominal pain. No change in weight. No melena. He was doing very well previously on a PPI chronically and probiotic. There's a question of small bowel bacterial overgrowth raised at [facility] previously. I'm not sure whether or not he took antibiotics previously Prior gastric imaging study within normal limits. It's been now a few years since he had an endoscopic evaluation.   Recommendations:  Given his symptoms, I feel that an EGD and diagnostic colonoscopy are warranted. Potential for esophageal dilation reviewed. I discussed this approach with the patient.The risks,  benefits, limitations, imponderables and alternatives regarding both EGD and colonoscopy have been reviewed with the patient. Questions have been answered. All parties agreeable.     Notice: This dictation was prepared with Dragon dictation along with smaller phrase technology. Any transcriptional errors that result from this process are unintentional and may not be corrected upon review.

## 2014-05-28 NOTE — Op Note (Signed)
[place], [address]   ENDOSCOPY PROCEDURE REPORT  PATIENT: [name] [name], [name] [name]  MR#: [id number] BIRTHDATE: Apr 14, 1941 , 61  yrs. old GENDER: male ENDOSCOPIST: R.  Garfield Cornea, MD FACP FACG REFERRED BY:  Kerin Perna, M.D. PROCEDURE DATE:  Jun 12, 2014 PROCEDURE:  EGD, diagnostic, EGD w/ biopsy , and Maloney dilation of esophagus INDICATIONS:  Esophageal dysphagia; GERD. MEDICATIONS: Versed 3 mg IV and Demerol 50 mg IV in divided doses. Xylocaine gel orally.  Zofran 4 mg IV. ASA CLASS:      Class III  CONSENT: The risks, benefits, limitations, alternatives and imponderables have been discussed.  The potential for biopsy, esophogeal dilation, etc. have also been reviewed.  Questions have been answered.  All parties agreeable.  Please see the history and physical in the medical record for more information.  DESCRIPTION OF PROCEDURE: After the risks benefits and alternatives of the procedure were thoroughly explained, informed consent was obtained.  The EG-[id number] ([id number]) endoscope was introduced through the mouth and advanced to the second portion of the duodenum , limited by Without limitations. The instrument was slowly withdrawn as the mucosa was fully examined.    Noncritical.  Schatzki's ring otherwise normal esophagus.  Stomach empty.  Diffuse submucosal petechiae with some antral erosions.  No ulcer or infiltrating process.  Small hiatal hernia.  Patent pylorus.  Normal first and second portion of the duodenum  Scope was withdrawn.  Subsequently, a 69 French Maloney dilator was passed to full insertion easily.  A look back revealed no apparent complications related to this maneuver.  Finally, biopsies of the abnormal gastrointestinal taken for histologic study.  Retroflexed views revealed a hiatal hernia.     The scope was then withdrawn from the patient and the procedure completed.  COMPLICATIONS: There were no immediate  complications.  ENDOSCOPIC IMPRESSION: Noncritical. Schatzki's ring?"status post Maloney dilation. Hiatal hernia. Abnormal gastric mucosa?"status post gastric biopsy.  RECOMMENDATIONS: Follow-up on pathology. See colonoscopy report.  REPEAT EXAM:  eSigned:  R. Garfield Cornea, MD Rosalita Chessman [facility] 2014/06/12 9:58 AM    CC:  CPT CODES: ICD CODES:  The ICD and CPT codes recommended by this software are interpretations from the data that the clinical staff has captured with the software.  The verification of the translation of this report to the ICD and CPT codes and modifiers is the sole responsibility of the health care institution and practicing physician where this report was generated.  [employer] will not be held responsible for the validity of the ICD and CPT codes included on this report.  AMA assumes no liability for data contained or not contained herein. CPT is a Designer, television/film set of the [employer].  PATIENT NAME:  [name], [name] MR#: [id number]

## 2014-05-28 NOTE — Op Note (Signed)
[facility] [address] [place], [address]   COLONOSCOPY PROCEDURE REPORT  PATIENT: [name] [name], [name] [name]  MR#: [id number] BIRTHDATE: Dec 10, 1940 , 46  yrs. old GENDER: male ENDOSCOPIST: R.  Garfield Cornea, MD FACP [facility] REFERRED EU:MPNTI Gerarda Fraction, M.D. PROCEDURE DATE:  June 24, 2014 PROCEDURE:   Colonoscopy with snare polypectomy and Colonoscopy with biopsy INDICATIONS:Paper hematochezia; history of colonic adenoma. MEDICATIONS: Versed 3 mg IV and Demerol 50 mg IV in divided doses. Zofran 4 mg IV. ASA CLASS:       Class III  CONSENT: The risks, benefits, alternatives and imponderables including but not limited to bleeding, perforation as well as the possibility of a missed lesion have been reviewed.  The potential for biopsy, lesion removal, etc. have also been discussed. Questions have been answered.  All parties agreeable.  Please see the history and physical in the medical record for more information.  DESCRIPTION OF PROCEDURE:   After the risks benefits and alternatives of the procedure were thoroughly explained, informed consent was obtained.  The digital rectal exam      The EC-[id number] ([id number])  endoscope was introduced through the anus and advanced to the   . No adverse events experienced.   The quality of the prep was adequate.  The instrument was then slowly withdrawn as the colon was fully examined.      COLON FINDINGS: Internal hemorrhoids and a single anal papilla; otherwise, normal rectum.  Redundant elongated colon.  Scattered left-sided diverticula; (2) 41mm pedunculated polyps in the distal transverse segment; (1) 5 mm millimeter pedunculated polyp in the mid descending colon polyp?" and (1) diminutive polyp at the same level; otherwise, remainder of the colonic mucosa.  Normal.  We did have to apply some external abdominal pressure to reach the cecum. The transverse colon polyps were removed with hot snare cautery; the larger descending colon  polyp was cold snare removed and the adjacent diminutive polyp was cold biopsied/removed.  Retroflexion was performed. .  Withdrawal time=10 minutes 0 seconds.  The scope was withdrawn and the procedure completed. COMPLICATIONS: There were no immediate complications.  ENDOSCOPIC IMPRESSION: Multiple colonic polyps?"removed as described above. Colonic diverticulosis. Internal hemorrhoids. Redundant colon.  RECOMMENDATIONS: Follow up on pathology. See EGD report.  eSigned:  R. Garfield Cornea, MD Rosalita Chessman [facility] 2014-06-24 10:29 AM   cc:  CPT CODES: ICD CODES:  The ICD and CPT codes recommended by this software are interpretations from the data that the clinical staff has captured with the software.  The verification of the translation of this report to the ICD and CPT codes and modifiers is the sole responsibility of the health care institution and practicing physician where this report was generated.  [employer] will not be held responsible for the validity of the ICD and CPT codes included on this report.  AMA assumes no liability for data contained or not contained herein. CPT is a Designer, television/film set of the [employer].  PATIENT NAME:  Binnie, Vonderhaar MR#: [id number]

## 2014-05-28 NOTE — Interval H&P Note (Signed)
History and Physical Interval Note:  05/28/2014 9:31 AM  [name] [name]  has presented today for surgery, with the diagnosis of satiety,nausea, hematochezia  The various methods of treatment have been discussed with the patient and family. After consideration of risks, benefits and other options for treatment, the patient has consented to  Procedure(s) with comments: ESOPHAGOGASTRODUODENOSCOPY (EGD) (N/A) - 945am - moved to 9:30 - [place] to notify pt SAVORY DILATION (N/A) MALONEY DILATION (N/A) COLONOSCOPY (N/A) as a surgical intervention .  The patient's history has been reviewed, patient examined, no change in status, stable for surgery.  I have reviewed the patient's chart and labs.  Questions were answered to the patient's satisfaction.     Marae Cottrell  No change. Patient states he is actually still on Protonix 40 mg twice daily in contradistinction to what is documented in the office noted. Otherwise, no change. EGD with possible esophageal dilation and colonoscopy today for plan. The risks, benefits, limitations, imponderables and alternatives regarding both EGD and colonoscopy have been reviewed with the patient. Questions have been answered. All parties agreeable.

## 2014-05-28 NOTE — Discharge Instructions (Addendum)
Colonoscopy Discharge Instructions  Read the instructions outlined below and refer to this sheet in the next few weeks. These discharge instructions provide you with general information on caring for yourself after you leave the hospital. Your doctor may also give you specific instructions. While your treatment has been planned according to the most current medical practices available, unavoidable complications occasionally occur. If you have any problems or questions after discharge, call Dr. Gala Romney at [phone]. ACTIVITY  You may resume your regular activity, but move at a slower pace for the next 24 hours.   Take frequent rest periods for the next 24 hours.   Walking will help get rid of the air and reduce the bloated feeling in your belly (abdomen).   No driving for 24 hours (because of the medicine (anesthesia) used during the test).    Do not sign any important legal documents or operate any machinery for 24 hours (because of the anesthesia used during the test).  NUTRITION  Drink plenty of fluids.   You may resume your normal diet as instructed by your doctor.   Begin with a light meal and progress to your normal diet. Heavy or fried foods are harder to digest and may make you feel sick to your stomach (nauseated).   Avoid alcoholic beverages for 24 hours or as instructed.  MEDICATIONS  You may resume your normal medications unless your doctor tells you otherwise.  WHAT YOU CAN EXPECT TODAY  Some feelings of bloating in the abdomen.   Passage of more gas than usual.   Spotting of blood in your stool or on the toilet paper.  IF YOU HAD POLYPS REMOVED DURING THE COLONOSCOPY:  No aspirin products for 7 days or as instructed.   No alcohol for 7 days or as instructed.   Eat a soft diet for the next 24 hours.  FINDING OUT THE RESULTS OF YOUR TEST Not all test results are available during your visit. If your test results are not back during the visit, make an appointment  with your caregiver to find out the results. Do not assume everything is normal if you have not heard from your caregiver or the medical facility. It is important for you to follow up on all of your test results.  SEEK IMMEDIATE MEDICAL ATTENTION IF:  You have more than a spotting of blood in your stool.   Your belly is swollen (abdominal distention).   You are nauseated or vomiting.   You have a temperature over 101.  You have abdominal pain or discomfort that is severe or gets worse throughout the day.  Colonoscopy Discharge Instructions  Read the instructions outlined below and refer to this sheet in the next few weeks. These discharge instructions provide you with general information on caring for yourself after you leave the hospital. Your doctor may also give you specific instructions. While your treatment has been planned according to the most current medical practices available, unavoidable complications occasionally occur. If you have any problems or questions after discharge, call Dr. Gala Romney at [phone]. ACTIVITY You may resume your regular activity, but move at a slower pace for the next 24 hours.  Take frequent rest periods for the next 24 hours.  Walking will help get rid of the air and reduce the bloated feeling in your belly (abdomen).  No driving for 24 hours (because of the medicine (anesthesia) used during the test).   Do not sign any important legal documents or operate any machinery for 24 hours (  because of the anesthesia used during the test).  NUTRITION Drink plenty of fluids.  You may resume your normal diet as instructed by your doctor.  Begin with a light meal and progress to your normal diet. Heavy or fried foods are harder to digest and may make you feel sick to your stomach (nauseated).  Avoid alcoholic beverages for 24 hours or as instructed.  MEDICATIONS You may resume your normal medications unless your doctor tells you otherwise.  WHAT YOU CAN EXPECT  TODAY Some feelings of bloating in the abdomen.  Passage of more gas than usual.  Spotting of blood in your stool or on the toilet paper.  IF YOU HAD POLYPS REMOVED DURING THE COLONOSCOPY: No aspirin products for 7 days or as instructed.  No alcohol for 7 days or as instructed.  Eat a soft diet for the next 24 hours.  FINDING OUT THE RESULTS OF YOUR TEST Not all test results are available during your visit. If your test results are not back during the visit, make an appointment with your caregiver to find out the results. Do not assume everything is normal if you have not heard from your caregiver or the medical facility. It is important for you to follow up on all of your test results.  SEEK IMMEDIATE MEDICAL ATTENTION IF: You have more than a spotting of blood in your stool.  Your belly is swollen (abdominal distention).  You are nauseated or vomiting.  You have a temperature over 101.   You have abdominal pain or discomfort that is severe or gets worse throughout the day.    Polyp and GERD information provided  Continue Protonix  Further recommendations to follow pending review of pathology.   Colon Polyps Polyps are lumps of extra tissue growing inside the body. Polyps can grow in the large intestine (colon). Most colon polyps are noncancerous (benign). However, some colon polyps can become cancerous over time. Polyps that are larger than a pea may be harmful. To be safe, caregivers remove and test all polyps. CAUSES  Polyps form when mutations in the genes cause your cells to grow and divide even though no more tissue is needed. RISK FACTORS There are a number of risk factors that can increase your chances of getting colon polyps. They include:  Being older than 50 years.  Family history of colon polyps or colon cancer.  Long-term colon diseases, such as colitis or Crohn disease.  Being overweight.  Smoking.  Being inactive.  Drinking too much alcohol. SYMPTOMS   Most small polyps do not cause symptoms. If symptoms are present, they may include:  Blood in the stool. The stool may look dark red or black.  Constipation or diarrhea that lasts longer than 1 week. DIAGNOSIS People often do not know they have polyps until their caregiver finds them during a regular checkup. Your caregiver can use 4 tests to check for polyps:  Digital rectal exam. The caregiver wears gloves and feels inside the rectum. This test would find polyps only in the rectum.  Barium enema. The caregiver puts a liquid called barium into your rectum before taking X-rays of your colon. Barium makes your colon look white. Polyps are dark, so they are easy to see in the X-ray pictures.  Sigmoidoscopy. A thin, flexible tube (sigmoidoscope) is placed into your rectum. The sigmoidoscope has a light and tiny camera in it. The caregiver uses the sigmoidoscope to look at the last third of your colon.  Colonoscopy. This test is  like sigmoidoscopy, but the caregiver looks at the entire colon. This is the most common method for finding and removing polyps. TREATMENT  Any polyps will be removed during a sigmoidoscopy or colonoscopy. The polyps are then tested for cancer. PREVENTION  To help lower your risk of getting more colon polyps:  Eat plenty of fruits and vegetables. Avoid eating fatty foods.  Do not smoke.  Avoid drinking alcohol.  Exercise every day.  Lose weight if recommended by your caregiver.  Eat plenty of calcium and folate. Foods that are rich in calcium include milk, cheese, and broccoli. Foods that are rich in folate include chickpeas, kidney beans, and spinach. HOME CARE INSTRUCTIONS Keep all follow-up appointments as directed by your caregiver. You may need periodic exams to check for polyps. SEEK MEDICAL CARE IF: You notice bleeding during a bowel movement. Document Released: 02/03/2004 Document Revised: 08/01/2011 Document Reviewed: 07/19/2011 [facility] Patient  Information 2015 [place], [place]. This information is not intended to replace advice given to you by your health care provider. Make sure you discuss any questions you have with your health care provider.  Gastroesophageal Reflux Disease, Adult Gastroesophageal reflux disease (GERD) happens when acid from your stomach flows up into the esophagus. When acid comes in contact with the esophagus, the acid causes soreness (inflammation) in the esophagus. Over time, GERD may create small holes (ulcers) in the lining of the esophagus. CAUSES   Increased body weight. This puts pressure on the stomach, making acid rise from the stomach into the esophagus.  Smoking. This increases acid production in the stomach.  Drinking alcohol. This causes decreased pressure in the lower esophageal sphincter (valve or ring of muscle between the esophagus and stomach), allowing acid from the stomach into the esophagus.  Late evening meals and a full stomach. This increases pressure and acid production in the stomach.  A malformed lower esophageal sphincter. Sometimes, no cause is found. SYMPTOMS   Burning pain in the lower part of the mid-chest behind the breastbone and in the mid-stomach area. This may occur twice a week or more often.  Trouble swallowing.  Sore throat.  Dry cough.  Asthma-like symptoms including chest tightness, shortness of breath, or wheezing. DIAGNOSIS  Your caregiver may be able to diagnose GERD based on your symptoms. In some cases, X-rays and other tests may be done to check for complications or to check the condition of your stomach and esophagus. TREATMENT  Your caregiver may recommend over-the-counter or prescription medicines to help decrease acid production. Ask your caregiver before starting or adding any new medicines.  HOME CARE INSTRUCTIONS   Change the factors that you can control. Ask your caregiver for guidance concerning weight loss, quitting smoking, and alcohol  consumption.  Avoid foods and drinks that make your symptoms worse, such as:  Caffeine or alcoholic drinks.  Chocolate.  Peppermint or mint flavorings.  Garlic and onions.  Spicy foods.  Citrus fruits, such as oranges, lemons, or limes.  Tomato-based foods such as sauce, chili, salsa, and pizza.  Fried and fatty foods.  Avoid lying down for the 3 hours prior to your bedtime or prior to taking a nap.  Eat small, frequent meals instead of large meals.  Wear loose-fitting clothing. Do not wear anything tight around your waist that causes pressure on your stomach.  Raise the head of your bed 6 to 8 inches with wood blocks to help you sleep. Extra pillows will not help.  Only take over-the-counter or prescription medicines for pain, discomfort,  or fever as directed by your caregiver.  Do not take aspirin, ibuprofen, or other nonsteroidal anti-inflammatory drugs (NSAIDs). SEEK IMMEDIATE MEDICAL CARE IF:   You have pain in your arms, neck, jaw, teeth, or back.  Your pain increases or changes in intensity or duration.  You develop nausea, vomiting, or sweating (diaphoresis).  You develop shortness of breath, or you faint.  Your vomit is green, yellow, black, or looks like coffee grounds or blood.  Your stool is red, bloody, or black. These symptoms could be signs of other problems, such as heart disease, gastric bleeding, or esophageal bleeding. MAKE SURE YOU:   Understand these instructions.  Will watch your condition.  Will get help right away if you are not doing well or get worse. Document Released: 02/16/2005 Document Revised: 08/01/2011 Document Reviewed: 11/26/2010 [facility] Patient Information 2015 [place], [place]. This information is not intended to replace advice given to you by your health care provider. Make sure you discuss any questions you have with your health care provider.

## 2014-05-29 ENCOUNTER — Encounter (HOSPITAL_COMMUNITY): Payer: Self-pay | Admitting: Internal Medicine

## 2014-05-29 ENCOUNTER — Encounter: Payer: Self-pay | Admitting: Internal Medicine

## 2014-05-30 ENCOUNTER — Telehealth: Payer: Self-pay

## 2014-05-30 ENCOUNTER — Other Ambulatory Visit (HOSPITAL_COMMUNITY): Payer: Self-pay | Admitting: Internal Medicine

## 2014-05-30 ENCOUNTER — Encounter: Payer: Self-pay | Admitting: Internal Medicine

## 2014-05-30 ENCOUNTER — Ambulatory Visit (HOSPITAL_COMMUNITY)
Admission: RE | Admit: 2014-05-30 | Discharge: 2014-05-30 | Disposition: A | Discharge status: Home / Self Care | Payer: Medicare Other | Source: Ambulatory Visit | Attending: Internal Medicine | Admitting: Internal Medicine

## 2014-05-30 DIAGNOSIS — R51 Headache: Principal | ICD-10-CM

## 2014-05-30 DIAGNOSIS — I10 Essential (primary) hypertension: Secondary | ICD-10-CM | POA: Diagnosis not present

## 2014-05-30 DIAGNOSIS — R220 Localized swelling, mass and lump, head: Secondary | ICD-10-CM | POA: Diagnosis not present

## 2014-05-30 DIAGNOSIS — G894 Chronic pain syndrome: Secondary | ICD-10-CM | POA: Diagnosis not present

## 2014-05-30 DIAGNOSIS — R519 Headache, unspecified: Secondary | ICD-10-CM

## 2014-05-30 DIAGNOSIS — R201 Hypoesthesia of skin: Secondary | ICD-10-CM

## 2014-05-30 DIAGNOSIS — R531 Weakness: Secondary | ICD-10-CM | POA: Insufficient documentation

## 2014-05-30 DIAGNOSIS — E782 Mixed hyperlipidemia: Secondary | ICD-10-CM | POA: Diagnosis not present

## 2014-05-30 DIAGNOSIS — M79602 Pain in left arm: Secondary | ICD-10-CM | POA: Insufficient documentation

## 2014-05-30 DIAGNOSIS — Z6841 Body Mass Index (BMI) 40.0 and over, adult: Secondary | ICD-10-CM | POA: Diagnosis not present

## 2014-05-30 DIAGNOSIS — R200 Anesthesia of skin: Secondary | ICD-10-CM

## 2014-05-30 NOTE — Telephone Encounter (Signed)
Per RMR- Send letter to patient.  Send copy of letter with path to referring provider and PCP.  Ov w extender in 6 mos re: GERD

## 2014-05-30 NOTE — Telephone Encounter (Signed)
APPOINTMENT MADE AND LETTER SENT °

## 2014-05-30 NOTE — Telephone Encounter (Signed)
Letter mailed to the pt. 

## 2014-06-02 ENCOUNTER — Ambulatory Visit (HOSPITAL_COMMUNITY)
Admission: RE | Admit: 2014-06-02 | Discharge: 2014-06-02 | Disposition: A | Discharge status: Home / Self Care | Payer: Medicare Other | Source: Ambulatory Visit | Attending: Internal Medicine | Admitting: Internal Medicine

## 2014-06-02 DIAGNOSIS — R201 Hypoesthesia of skin: Secondary | ICD-10-CM

## 2014-06-02 DIAGNOSIS — R519 Headache, unspecified: Secondary | ICD-10-CM

## 2014-06-02 DIAGNOSIS — R51 Headache: Secondary | ICD-10-CM | POA: Diagnosis present

## 2014-06-02 DIAGNOSIS — I6523 Occlusion and stenosis of bilateral carotid arteries: Secondary | ICD-10-CM | POA: Diagnosis not present

## 2014-06-03 DIAGNOSIS — I739 Peripheral vascular disease, unspecified: Secondary | ICD-10-CM | POA: Diagnosis not present

## 2014-06-04 ENCOUNTER — Ambulatory Visit: Payer: Medicare Other | Admitting: Gastroenterology

## 2014-06-17 ENCOUNTER — Ambulatory Visit (INDEPENDENT_AMBULATORY_CARE_PROVIDER_SITE_OTHER): Payer: Medicare Other | Admitting: Urology

## 2014-06-17 DIAGNOSIS — N3281 Overactive bladder: Secondary | ICD-10-CM

## 2014-06-17 DIAGNOSIS — N400 Enlarged prostate without lower urinary tract symptoms: Secondary | ICD-10-CM | POA: Diagnosis not present

## 2014-06-17 DIAGNOSIS — N529 Male erectile dysfunction, unspecified: Secondary | ICD-10-CM

## 2014-07-10 DIAGNOSIS — M795 Residual foreign body in soft tissue: Secondary | ICD-10-CM | POA: Diagnosis not present

## 2014-07-10 DIAGNOSIS — I739 Peripheral vascular disease, unspecified: Secondary | ICD-10-CM | POA: Diagnosis not present

## 2014-07-10 DIAGNOSIS — M79671 Pain in right foot: Secondary | ICD-10-CM | POA: Diagnosis not present

## 2014-07-11 ENCOUNTER — Encounter: Payer: Self-pay | Admitting: Neurology

## 2014-07-11 ENCOUNTER — Ambulatory Visit (INDEPENDENT_AMBULATORY_CARE_PROVIDER_SITE_OTHER): Payer: Medicare Other | Admitting: Neurology

## 2014-07-11 VITALS — BP 150/84 | HR 88 | Ht 68.0 in | Wt 285.4 lb

## 2014-07-11 DIAGNOSIS — G44309 Post-traumatic headache, unspecified, not intractable: Secondary | ICD-10-CM

## 2014-07-11 DIAGNOSIS — G4452 New daily persistent headache (NDPH): Secondary | ICD-10-CM

## 2014-07-11 DIAGNOSIS — G63 Polyneuropathy in diseases classified elsewhere: Secondary | ICD-10-CM | POA: Diagnosis not present

## 2014-07-11 DIAGNOSIS — I739 Peripheral vascular disease, unspecified: Secondary | ICD-10-CM

## 2014-07-11 DIAGNOSIS — G5622 Lesion of ulnar nerve, left upper limb: Secondary | ICD-10-CM | POA: Diagnosis not present

## 2014-07-11 NOTE — Progress Notes (Signed)
Note faxed.

## 2014-07-11 NOTE — Progress Notes (Signed)
[facility] Clinic Note - Initial Visit   Date: 07/11/2014   [name] [name] MRN: [id number] DOB: Aug 20, 1940   Dear Dr. Gerarda Fraction:  Thank you for your kind referral of Dodger Sinning for consultation of headaches and left sided paresthesias. Although his history is well known to you, please allow [name] to reiterate it for the purpose of our medical record. The patient was accompanied to the clinic by self.    History of Present Illness: [name] [name] is a 74 y.o. right-handed African American male with CHF, hyperlipidemia, hypertension, GERD, osteoarthritis, lumbar decompression and fusion with chronic low back pain, morbid obesity, and OSA  presenting for evaluation of left sided paresthesias and headaches.    He suffered a fall in October 2015 while getting out of bed and hit his head on the closet door.  He reports loosing consciousness for a minute.  He developed swelling over the left forehead and an abrasion to the left knee.  He was able to get up by himself and called his son who took him to the emergency department for evaluation.  CT head showed scalp hematoma.  US carotids showed < 50% stenosis.  Since the fall, he started having left sided headaches, described as sharp and throbbing.  Pain is improved by rest and percocet.  Headache were occuring daily, but now have improved to 1-2 times per week, lasting 1-3 hours.  He has noticed that headaches were worse if he sneezes, but this has also improved.  He reports having nausea with headaches and left face, arm and leg numbness.  Denies any dizziness or weakness.  He is most concerned that he is having a stroke.  Over the past 4-5 years, he has also history of ulnar neuropathy as per referring clinic notes.  He has had prior EMG, but we do not have these report.  He complains of intermittent left hand paresthesias involving the last three digits and hand weakness.  Out-side paper records, electronic medical  record, and images have been reviewed where available and summarized as:  CT head 03/06/2014:   1. Small scalp hematoma over the left side of the frontal bone. 2. Otherwise, normal exams.  CT head 05/30/2914:  Negative   US carotids 06/02/2014:  Less than 50% stenosis in the right and left internal carotid arteries.  Past Medical History  Diagnosis Date  . CHF (congestive heart failure)   . Obesity   . Hyperlipidemia   . PUD (peptic ulcer disease)   . GERD (gastroesophageal reflux disease)   . S/P endoscopy 2007, 2012    2007: nl, May 2012: antral erosions  . Osteoarthritis   . S/P colonoscopy 2007, Feb and March 2012    hx of adenomas, left-sided diverticula, On 07/2010 TCS, fresh blood and clot noted coming from TI.  Marland Kitchen Anxiety   . Hypothyroidism     hypothyroid  . Ringing in ears   . History of kidney stones   . History of bladder infections   . Heel spur     right heel  . Collagen vascular disease     history of venous insufficency and LE cellulitis  . Thrombocytopenia 12/30/2011    Stable  . BPH (benign prostatic hyperplasia)   . Hypertension     Sees Dr. Debara Pickett  . Bilateral lower leg cellulitis   . Sleep apnea     uses oxygen 2L via [place] cannot tolerate CPAP - sleep study 2008 [facility] 48.80/hr and 56.70hr during REM  .  Complication of anesthesia     due to sleep apnea pt has had difficulty being put under anesthesia  . PONV (postoperative nausea and vomiting)   . Deep venous insufficiency   . Chronic diastolic HF (heart failure)     Past Surgical History  Procedure Laterality Date  . Knee arthroscopy    . Rotator cuff repair    . Cholecystectomy    . Lumbar spine surgery    . Small bowel capsule study  07/2011    ?transient focal ischemia in distal small bowel to explain GI bleeding  . Esophagogastroduodenoscopy  Aug 2013    [facility], single 58mm pedunculated polyp otherwise normal. HYPERPLASTIC, NEGATIVE h.pylori  . Cardiac catheterization  02/27/2007    no sign of CAD,  LVH, mod pulm HTN (Dr. Jackie Plum(  . Transurethral resection of prostate    . Transurethral resection of prostate N/A 06/18/2013    Procedure: TRANSURETHRAL RESECTION OF THE PROSTATE (TURP);  Surgeon: Marissa Nestle, MD;  Location: [facility];  Service: Urology;  Laterality: N/A;  . Transthoracic echocardiogram  09/2008    EF 60-65%, mod conc LVH  . Nm myocar perf wall motion  12/2006    dipyridamole myoview - stress images show mild perfusion defect in mid inferior walls with reversibility at rest; mild perfusion defect in mid inferolateral wall at stress with mild defect reversibility, EF 62%, abnormal but low risk study   . Esophagogastroduodenoscopy N/A 05/28/2014    Procedure: ESOPHAGOGASTRODUODENOSCOPY (EGD);  Surgeon: Daneil Dolin, MD;  Location: [facility];  Service: Endoscopy;  Laterality: N/A;  945am - moved to 9:30 - [place] to notify pt  . Savory dilation N/A 05/28/2014    Procedure: SAVORY DILATION;  Surgeon: Daneil Dolin, MD;  Location: [facility];  Service: Endoscopy;  Laterality: N/A;  Venia Minks dilation N/A 05/28/2014    Procedure: Venia Minks DILATION;  Surgeon: Daneil Dolin, MD;  Location: [facility];  Service: Endoscopy;  Laterality: N/A;  . Colonoscopy N/A 05/28/2014    Procedure: COLONOSCOPY;  Surgeon: Daneil Dolin, MD;  Location: [facility];  Service: Endoscopy;  Laterality: N/A;     Medications:  Current Outpatient Prescriptions on File Prior to Visit  Medication Sig Dispense Refill  . acetaminophen (TYLENOL) 325 MG tablet Take 325-650 mg by mouth every 6 (six) hours as needed for pain.     . cyclobenzaprine (FLEXERIL) 10 MG tablet Take 1 tablet (10 mg total) by mouth 3 (three) times daily as needed for muscle spasms. 45 tablet 0  . docusate sodium (COLACE) 100 MG capsule Take 100 mg by mouth daily as needed for mild constipation.     Marland Kitchen latanoprost (XALATAN) 0.005 % ophthalmic solution Place 1 drop into both eyes at bedtime.    . metoprolol succinate (TOPROL-XL) 25  MG 24 hr tablet Take 25 mg by mouth daily.    . mirabegron ER (MYRBETRIQ) 25 MG TB24 tablet Take 25 mg by mouth daily.    Marland Kitchen oxyCODONE-acetaminophen (PERCOCET) 10-325 MG per tablet Take 1 tablet by mouth every 6 (six) hours as needed for pain. 90 tablet 0  . pantoprazole (PROTONIX) 40 MG tablet Take 40 mg by mouth 2 (two) times daily.    Vladimir Faster Glycol-Propyl Glycol (LUBRICANT EYE DROPS) 0.4-0.3 % SOLN Apply 1 drop to eye daily as needed (for lubricant eye drops).    . potassium chloride (KLOR-CON) 20 MEQ packet Take 20 mEq by mouth 2 (two) times daily.    Marland Kitchen  promethazine (PHENERGAN) 12.5 MG tablet Take 12.5 mg by mouth every 6 (six) hours as needed for nausea or vomiting.    . timolol (BETIMOL) 0.5 % ophthalmic solution Place 1 drop into the left eye 2 (two) times daily.    Marland Kitchen torsemide (DEMADEX) 20 MG tablet Take 40 mg by mouth 2 (two) times daily.     No current facility-administered medications on file prior to visit.    Allergies:  Allergies  Allergen Reactions  . Aspirin Other (See Comments)    Nausea and upset stomach    . Nexium [Esomeprazole Magnesium] Other (See Comments)    Patient said it messed his stomach up    Family History: Family History  Problem Relation Age of Onset  . Colon cancer Father 56    deceased  . Prostate cancer Father   . Pancreatic cancer Mother 39    deceased    Social History: History   Social History  . Marital Status: Married    Spouse Name: Roger Kill  . Number of Children: 5  . Years of Education: College   Occupational History  . disability    Social History Main Topics  . Smoking status: Former Smoker -- 8 years    Types: Cigars    Start date: 11/20/1959    Quit date: 08/10/1973  . Smokeless tobacco: Never Used  . Alcohol Use: No  . Drug Use: No  . Sexual Activity: No   Other Topics Concern  . Not on file   Social History Narrative   Patient lives at home with spouse and son.   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        Review of Systems:  CONSTITUTIONAL: No fevers, chills, night sweats, or weight loss.   EYES: No visual changes or eye pain ENT: No hearing changes.  No history of nose bleeds.   RESPIRATORY: No cough, wheezing and shortness of breath.   CARDIOVASCULAR: Negative for chest pain, and palpitations.   GI: Negative for abdominal discomfort, blood in stools or black stools.  No recent change in bowel habits.   GU:  No history of incontinence.   MUSCLOSKELETAL: ++history of joint pain or swelling.  No myalgias.   SKIN: Negative for lesions, rash, and itching.   HEMATOLOGY/ONCOLOGY: Negative for prolonged bleeding, bruising easily, and swollen nodes.  No history of cancer.   ENDOCRINE: Negative for cold or heat intolerance, polydipsia or goiter.   PSYCH:  No depression or anxiety symptoms.   NEURO: As Above.   Vital Signs:  BP 150/84 mmHg  Pulse 88  Ht 5\' 8"  (1.727 m)  Wt 285 lb 6 oz (129.445 kg)  BMI 43.40 kg/m2  SpO2 92%   General Medical Exam:   General:  Obese, well appearing, comfortable.   Eyes/ENT: see cranial nerve examination.   Neck: No masses appreciated.  Full range of motion without tenderness.  No carotid bruits. Respiratory:  Clear to auscultation, good air entry bilaterally.   Cardiac:  Regular rate and rhythm, no murmur.   Extremities:  No deformities, edema, or skin discoloration.  Skin:  Dry skin.  Neurological Exam: MENTAL STATUS including orientation to time, place, person, recent and remote memory, attention span and concentration, language, and fund of knowledge is normal.  Speech is not dysarthric.  CRANIAL NERVES: II:  No visual field defects.  Limited fundoscopic exam due to small pupils.    III-IV-VI: Pupils equal round and reactive to light.  Normal conjugate, extra-ocular eye movements in all directions of gaze.  No nystagmus.  Right ptosis (old).   V:  Normal facial sensation.  VII:  Normal facial symmetry and  movements.   VIII:  Normal hearing and vestibular function.   IX-X:  Normal palatal movement.   XI:  Normal shoulder shrug and head rotation.   XII:  Normal tongue strength and range of motion, no deviation or fasciculation.  MOTOR:  Mild left intrinsic hand muscle atrophy, subtle bilateral hand intention tremor. No pronator drift.  Tone is normal.    Right Upper Extremity:    Left Upper Extremity:    Deltoid  5/5   Deltoid  5/5   Biceps  5/5   Biceps  5/5   Triceps  5/5   Triceps  5/5   Wrist extensors  5/5   Wrist extensors  5/5   Wrist flexors  5/5   Wrist flexors  5/5   Finger extensors  5/5   Finger extensors  5/5   Finger flexors  5/5   Finger flexors 4/5 5-/5   Dorsal interossei  5/5   Dorsal interossei  5-/5   Abductor pollicis  5/5   Abductor pollicis  5/5   Tone (Ashworth scale)  0  Tone (Ashworth scale)  0   Right Lower Extremity:    Left Lower Extremity:    Hip flexors  5-/5   Hip flexors  5-/5   Hip extensors  5/5   Hip extensors  5/5   Knee flexors  5/5   Knee flexors  5/5   Knee extensors  5/5   Knee extensors  5/5   Dorsiflexors  5/5   Dorsiflexors  5/5   Plantarflexors  5/5   Plantarflexors  5/5   Toe extensors  5/5   Toe extensors  5/5   Toe flexors  5/5   Toe flexors  5/5   Tone (Ashworth scale)  0  Tone (Ashworth scale)  0   MSRs:  Right                                                                 Left brachioradialis 2+  brachioradialis 2+  biceps 2+  biceps 2+  triceps 2+  triceps 2+  patellar 2+  patellar 2+  ankle jerk 1+  ankle jerk 1+  Hoffman no  Hoffman no  plantar response down  plantar response down   SENSORY:  Diminished vibration and pin prick in the feet bilaterally. Romberg's sign absent.   COORDINATION/GAIT: Normal finger-to- nose-finger.  Intact rapid alternating movements bilaterally.  Unable to rise from a chair without using arms due to body habitus.  Gait is wide-based, slow, antalgic appearing, but stable.    IMPRESSION: 1.   Post-concussive headaches manifesting with left sided headaches and left sided paresthesias, improving  - I reassured the patient that there is no evidence of stroke on his recent CT image from January, when compared to October  - US carotids with <50% stenosis bilaterally  - transient nature of paresthesias with associated headache suggest complex migraine and less likely TIA  - If symptoms worsen, MRI brain will be the next step, but since headaches and paresthesias are improving, will hold off for now   2.  Left ulnar neuropathy  - Encouraged to start using soft elbow pad  - Avoid hyperflexion at the elbow  - Repeat EMG and/occupation therapy if symptoms worsen, he is not interested in surgery  3.  Distal and symmetric peripheral neuropathy, patient is asymptomatic  - Etiology most likely due to peripheral vascular disease   - Fall precautions discussed  4.  Morbid obesity  - Weight loss encouraged  Patient to call with update in 3-months   The duration of this appointment visit was 55 minutes of face-to-face time with the patient.  Greater than 50% of this time was spent in counseling, explanation of diagnosis, planning of further management, and coordination of care.   Thank you for allowing me to participate in patient's care.  If I can answer any additional questions, I would be pleased to do so.    Sincerely,    Tali Coster K. Posey Pronto, DO

## 2014-07-11 NOTE — Patient Instructions (Signed)
1.  Start using soft elbow pad to left elbow  2.  Avoid over flexing the left elbow since this will stretch the nerve and make your hand worse 3.  If headaches worsen, can order MRI brain 4.  If hand tingling or weakness worsens, we can order therapy 5.  Please call my office in 2 months

## 2014-07-14 ENCOUNTER — Ambulatory Visit: Payer: Medicare Other | Admitting: Gastroenterology

## 2014-07-17 ENCOUNTER — Encounter (HOSPITAL_BASED_OUTPATIENT_CLINIC_OR_DEPARTMENT_OTHER): Payer: Medicare Other

## 2014-07-17 ENCOUNTER — Encounter (HOSPITAL_COMMUNITY): Payer: Self-pay | Admitting: Oncology

## 2014-07-17 ENCOUNTER — Encounter (HOSPITAL_COMMUNITY): Payer: Medicare Other | Attending: Oncology | Admitting: Oncology

## 2014-07-17 VITALS — BP 138/87 | HR 72 | Temp 98.0°F | Resp 16 | Wt 288.3 lb

## 2014-07-17 DIAGNOSIS — D696 Thrombocytopenia, unspecified: Secondary | ICD-10-CM | POA: Insufficient documentation

## 2014-07-17 DIAGNOSIS — D72829 Elevated white blood cell count, unspecified: Secondary | ICD-10-CM

## 2014-07-17 DIAGNOSIS — K625 Hemorrhage of anus and rectum: Secondary | ICD-10-CM | POA: Diagnosis not present

## 2014-07-17 LAB — CBC WITH DIFFERENTIAL/PLATELET
BASOS ABS: 0.1 10*3/uL (ref 0.0–0.1)
BASOS PCT: 1 % (ref 0–1)
EOS ABS: 0.1 10*3/uL (ref 0.0–0.7)
Eosinophils Relative: 1 % (ref 0–5)
HEMATOCRIT: 45.1 % (ref 39.0–52.0)
Hemoglobin: 14.7 g/dL (ref 13.0–17.0)
Lymphocytes Relative: 11 % — ABNORMAL LOW (ref 12–46)
Lymphs Abs: 1.2 10*3/uL (ref 0.7–4.0)
MCH: 25.7 pg — ABNORMAL LOW (ref 26.0–34.0)
MCHC: 32.6 g/dL (ref 30.0–36.0)
MCV: 79 fL (ref 78.0–100.0)
Monocytes Absolute: 0.8 10*3/uL (ref 0.1–1.0)
Monocytes Relative: 8 % (ref 3–12)
Neutro Abs: 8.4 10*3/uL — ABNORMAL HIGH (ref 1.7–7.7)
Neutrophils Relative %: 79 % — ABNORMAL HIGH (ref 43–77)
Platelets: 134 10*3/uL — ABNORMAL LOW (ref 150–400)
RBC: 5.71 MIL/uL (ref 4.22–5.81)
RDW: 17.4 % — AB (ref 11.5–15.5)
WBC: 10.5 10*3/uL (ref 4.0–10.5)

## 2014-07-17 LAB — IRON AND TIBC
Iron: 36 ug/dL — ABNORMAL LOW (ref 42–165)
SATURATION RATIOS: 15 % — AB (ref 20–55)
TIBC: 244 ug/dL (ref 215–435)
UIBC: 208 ug/dL (ref 125–400)

## 2014-07-17 LAB — FERRITIN: FERRITIN: 33 ng/mL (ref 22–322)

## 2014-07-17 LAB — SEDIMENTATION RATE: SED RATE: 2 mm/h (ref 0–16)

## 2014-07-17 LAB — C-REACTIVE PROTEIN: CRP: 0.5 mg/dL — AB (ref <0.60)

## 2014-07-17 NOTE — Assessment & Plan Note (Signed)
Waxing and waning since at least 2009 ranging in the 80's to 150's.  Very stable.  Labs today: CBC diff, iron/TIBC, ferritin, anticardiolipin antibodies.  Repeat CBC diff in 6 months and 12 months.  Return in 12 months.  Sooner if needed.

## 2014-07-17 NOTE — Assessment & Plan Note (Addendum)
Waxing and waning.  Dating back to at least 2010 with neutrophil predominance.  More elevated most recently.  Labs today: CBC diff, ESR, CRP, JAK2, BCR/ABL, and peripheral flow cytometry to evaluate for proliferative disease.  Repeat CBC diff in 6 and 12 months.  Patient education regarding B symptoms.  He knows to call us if needed.  Return in 12 months for follow-up, sooner if needed.  If work-up is negative and labs are stable, we will consider releasing patient from [facility] next year.

## 2014-07-17 NOTE — Patient Instructions (Signed)
[place] at [facility] Discharge Instructions  RECOMMENDATIONS MADE BY THE CONSULTANT AND ANY TEST RESULTS WILL BE SENT TO YOUR REFERRING PHYSICIAN.  Return for lab work in 6 months. Return for office visit and lab work in 12 months. Call the clinic and let us know if you have persistent fever, chills, night sweats, unintentional weight loss, enlarged lymph nodes.  Thank you for choosing [place] at [facility] to provide your oncology and hematology care.  To afford each patient quality time with our provider, please arrive at least 15 minutes before your scheduled appointment time.    You need to re-schedule your appointment should you arrive 10 or more minutes late.  We strive to give you quality time with our providers, and arriving late affects you and other patients whose appointments are after yours.  Also, if you no show three or more times for appointments you may be dismissed from the clinic at the providers discretion.     Again, thank you for choosing [facility].  Our hope is that these requests will decrease the amount of time that you wait before being seen by our physicians.       _____________________________________________________________  Should you have questions after your visit to [facility], please contact our office at [phone] between the hours of 8:30 a.m. and 4:30 p.m.  Voicemails left after 4:30 p.m. will not be returned until the following business day.  For prescription refill requests, have your pharmacy contact our office.

## 2014-07-17 NOTE — Progress Notes (Signed)
[name] [name]., MD [place] [address]  Thrombocytopenia - Plan: CBC with Differential, CBC with Differential, Bcr/abl gene rearrangement qnt, PCR, JAK2 genotypr, Sedimentation rate, C-reactive protein  Leukocytosis - Plan: CBC with Differential, CBC with Differential, Bcr/abl gene rearrangement qnt, PCR, JAK2 genotypr, Sedimentation rate, C-reactive protein  CURRENT THERAPY: Observation  INTERVAL HISTORY: [name] [name] 74 y.o. male returns for followup of waxing and waning thrombocytopenia, stable AND Mild leukocytosis, stable   I personally reviewed and went over laboratory results with the patient.  The results are noted within this dictation. Given his most recent lab results over the past 8-12 months, I will work-up this leukocytosis further.   I reviewed the patient's chart back to at least 2012. It is noted that his white blood cell count and platelet count is very stable dating back to 2012. Since 2012, his white blood cell count has remained within a stable range not exceeding 20.4. His white blood cell count has very stable. In addition, his platelet count has remained stable ranging between 82-156. Again, his platelet count is very safe period.  I personally reviewed and went over radiographic studies with the patient.  The results are noted within this dictation.    Chart reviewed.   He denies any B symptoms today.  He is educated about them. He knows to be on the look-out for these symptoms.    Past Medical History  Diagnosis Date  . CHF (congestive heart failure)   . Obesity   . Hyperlipidemia   . PUD (peptic ulcer disease)   . GERD (gastroesophageal reflux disease)   . S/P endoscopy 2007, 2012    2007: nl, May 2012: antral erosions  . Osteoarthritis   . S/P colonoscopy 2007, Feb and March 2012    hx of adenomas, left-sided diverticula, On 07/2010 TCS, fresh blood and clot noted coming from TI.  Marland Kitchen Anxiety   . Hypothyroidism       hypothyroid  . Ringing in ears   . History of kidney stones   . History of bladder infections   . Heel spur     right heel  . Collagen vascular disease     history of venous insufficency and LE cellulitis  . Thrombocytopenia 12/30/2011    Stable  . BPH (benign prostatic hyperplasia)   . Hypertension     Sees Dr. Debara Pickett  . Bilateral lower leg cellulitis   . Sleep apnea     uses oxygen 2L via [facility]. [facility] Colon cannot tolerate CPAP - sleep study 2008 AHI 48.80/hr and 56.70hr during REM  . Complication of anesthesia     due to sleep apnea pt has had difficulty being put under anesthesia  . PONV (postoperative nausea and vomiting)   . Deep venous insufficiency   . Chronic diastolic HF (heart failure)     has HYPERTENSION; GASTROESOPHAGEAL REFLUX DISEASE, CHRONIC; FATTY LIVER DISEASE; CHOLELITHIASIS, SYMPTOMATIC; RENAL INSUFFICIENCY; ABDOMINAL BLOATING; COLONIC POLYPS, ADENOMATOUS, HX OF; NAUSEA; DIARRHEA; Rectal bleeding; Constipation; Hypothyroidism; Upper abdominal pain; Anorexia; Bowel habit changes; Thrombocytopenia; SIRS (systemic inflammatory response syndrome); UTI (lower urinary tract infection); BPH (benign prostatic hyperplasia); Obesity; Hyperlipidemia; Constipation; Edema; Bright red blood per rectum; Other spondylosis with radiculopathy, lumbar region; Right heart failure; OSA (obstructive sleep apnea); Pre-operative cardiovascular examination; CAD, minor disease, 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     is allergic to aspirin and nexium.  Mr. Garabedian does not currently have medications on file.  Past Surgical History  Procedure Laterality Date  . Knee  arthroscopy    . Rotator cuff repair    . Cholecystectomy    . Lumbar spine surgery    . Small bowel capsule study  07/2011    ?transient focal ischemia in distal small bowel to explain GI bleeding  . Esophagogastroduodenoscopy  Aug 2013    [facility], single 4m pedunculated polyp otherwise normal. HYPERPLASTIC, NEGATIVE h.pylori  . Cardiac catheterization  02/27/2007    no sign of CAD, LVH, mod pulm HTN (Dr. JJackie Plum  . Transurethral resection of prostate    . Transurethral resection of prostate N/A 06/18/2013    Procedure: TRANSURETHRAL RESECTION OF THE PROSTATE (TURP);  Surgeon: MMarissa Nestle MD;  Location: [facility];  Service: Urology;  Laterality: N/A;  . Transthoracic echocardiogram  09/2008    EF 60-65%, mod conc LVH  . Nm myocar perf wall motion  12/2006    dipyridamole myoview - stress images show mild perfusion defect in mid inferior walls with reversibility at rest; mild perfusion defect in mid inferolateral wall at stress with mild defect reversibility, EF 62%, abnormal but low risk study   . Esophagogastroduodenoscopy N/A 05/28/2014    Procedure: ESOPHAGOGASTRODUODENOSCOPY (EGD);  Surgeon: RDaneil Dolin MD;  Location: [facility];  Service: Endoscopy;  Laterality: N/A;  945am - moved to 9:30 - Candy to notify pt  . Savory dilation N/A 05/28/2014    Procedure: SAVORY DILATION;  Surgeon: RDaneil Dolin MD;  Location: [facility];  Service: Endoscopy;  Laterality: N/A;  .Venia Minksdilation N/A 05/28/2014    Procedure: MVenia MinksDILATION;  Surgeon: RDaneil Dolin MD;  Location: [facility];  Service: Endoscopy;  Laterality: N/A;  . Colonoscopy N/A 05/28/2014    Procedure: COLONOSCOPY;  Surgeon: RDaneil Dolin MD;  Location: [facility];  Service: Endoscopy;  Laterality: N/A;    Denies any headaches, dizziness, double vision, fevers, chills, night sweats, nausea, vomiting, diarrhea, constipation, chest pain, heart palpitations, shortness of breath, blood in stool, black tarry stool,  urinary pain, urinary burning, urinary frequency, hematuria.   PHYSICAL EXAMINATION  ECOG PERFORMANCE STATUS: 0 - Asymptomatic  Filed Vitals:   07/17/14 1014  BP: 138/87  Pulse: 72  Temp: 98 F (36.7 C)  Resp: 16    GENERAL:alert, healthy, no distress, well nourished, well developed, comfortable, cooperative, obese and smiling SKIN: skin color, texture, turgor are normal, no rashes or significant lesions HEAD: Normocephalic, No masses, lesions, tenderness or abnormalities EYES: normal, PERRLA, EOMI, Conjunctiva are pink and non-injected EARS: External ears normal OROPHARYNX:lips, buccal mucosa, and tongue normal and mucous membranes are moist  NECK: supple, no adenopathy, thyroid normal size, non-tender, without nodularity, no stridor, non-tender, trachea midline LYMPH:  no palpable lymphadenopathy, no hepatosplenomegaly BREAST:not examined LUNGS: clear to auscultation and percussion HEART: regular rate & rhythm, no murmurs, no gallops, S1 normal and S2 normal ABDOMEN:abdomen soft, non-tender, obese, normal bowel sounds, no masses or organomegaly and organomegaly exam hindered due to body habitus BACK: Back symmetric, no curvature., No CVA tenderness EXTREMITIES:less then 2 second capillary refill, no joint deformities, effusion, or inflammation, no edema, no skin discoloration, no clubbing, no cyanosis  NEURO: alert & oriented x 3 with fluent speech, no  focal motor/sensory deficits, gait normal   LABORATORY DATA: CBC    Component Value Date/Time   WBC 10.5 07/17/2014 1015   RBC 5.71 07/17/2014 1015   HGB 14.7 07/17/2014 1015   HCT 45.1 07/17/2014 1015   PLT 134* 07/17/2014 1015   MCV 79.0 07/17/2014 1015   MCH 25.7* 07/17/2014 1015   MCHC 32.6 07/17/2014 1015   RDW 17.4* 07/17/2014 1015   LYMPHSABS 1.2 07/17/2014 1015   MONOABS 0.8 07/17/2014 1015   EOSABS 0.1 07/17/2014 1015   BASOSABS 0.1 07/17/2014 1015      Chemistry      Component Value Date/Time   NA 139  11/14/2013 1125   K 3.7 11/14/2013 1125   CL 99 11/14/2013 1125   CO2 32 11/14/2013 1125   BUN 13 11/14/2013 1125   CREATININE 1.08 11/14/2013 1125   CREATININE 1.02 09/03/2013 0542      Component Value Date/Time   CALCIUM 9.3 11/14/2013 1125   ALKPHOS 110 09/01/2013 1037   AST 22 09/01/2013 1037   ALT 21 09/01/2013 1037   BILITOT 1.7* 09/01/2013 1037       ASSESSMENT AND PLAN:  Thrombocytopenia Waxing and waning since at least 2009 ranging in the 80's to 150's.  Very stable.  Labs today: CBC diff, iron/TIBC, ferritin, anticardiolipin antibodies.  Repeat CBC diff in 6 months and 12 months.  Return in 12 months.  Sooner if needed.    Leukocytosis Waxing and waning.  Dating back to at least 2010 with neutrophil predominance.  More elevated most recently.  Labs today: CBC diff, ESR, CRP, JAK2, BCR/ABL, and peripheral flow cytometry to evaluate for proliferative disease.  Repeat CBC diff in 6 and 12 months.  Patient education regarding B symptoms.  He knows to call us if needed.  Return in 12 months for follow-up, sooner if needed.  If work-up is negative and labs are stable, we will consider releasing patient from [facility] next year.   THERAPY PLAN:  We will work-up leukocytosis further and rule out proliferative diseases.  Labs today demonstrate a WBC that is back WNL (albeit, upper limits of normal), but with an elevated neutrophil count.  Platelets are stable.    All questions were answered. The patient knows to call the clinic with any problems, questions or concerns. We can certainly see the patient much sooner if necessary.  Patient and plan discussed with Dr. Ancil Linsey and she is in agreement with the aforementioned.   This note is electronically signed by: Robynn Pane 07/17/2014 10:55 AM

## 2014-07-17 NOTE — Progress Notes (Signed)
Labs for cbcd,ferr,iron/tibc,bcr/abl,jak2,crp,esr,acard

## 2014-07-20 LAB — BCR/ABL GENE REARRANGEMENT QNT, PCR
BCR ABL1 / ABL1 IS: 0 %
BCR ABL1 / ABL1: 0 %

## 2014-07-20 LAB — CARDIOLIPIN ANTIBODIES, IGG, IGM, IGA
Anticardiolipin IgG: <9 GPL U/mL (ref 0–14)
Anticardiolipin IgM: <9 MPL U/mL (ref 0–12)

## 2014-07-23 LAB — P210 BCR-ABL 1: P210 BCR ABL1: NOT DETECTED

## 2014-07-23 LAB — P190 BCR-ABL 1: P190 BCR ABL1: NOT DETECTED

## 2014-07-24 LAB — JAK2 GENOTYPR

## 2014-07-28 DIAGNOSIS — M961 Postlaminectomy syndrome, not elsewhere classified: Secondary | ICD-10-CM | POA: Diagnosis not present

## 2014-07-28 DIAGNOSIS — M47817 Spondylosis without myelopathy or radiculopathy, lumbosacral region: Secondary | ICD-10-CM | POA: Diagnosis not present

## 2014-07-28 DIAGNOSIS — G894 Chronic pain syndrome: Secondary | ICD-10-CM | POA: Diagnosis not present

## 2014-07-28 DIAGNOSIS — M545 Low back pain: Secondary | ICD-10-CM | POA: Diagnosis not present

## 2014-07-28 DIAGNOSIS — M25561 Pain in right knee: Secondary | ICD-10-CM | POA: Diagnosis not present

## 2014-07-28 DIAGNOSIS — M25562 Pain in left knee: Secondary | ICD-10-CM | POA: Diagnosis not present

## 2014-07-28 DIAGNOSIS — H4011X3 Primary open-angle glaucoma, severe stage: Secondary | ICD-10-CM | POA: Diagnosis not present

## 2014-07-28 DIAGNOSIS — M79651 Pain in right thigh: Secondary | ICD-10-CM | POA: Diagnosis not present

## 2014-07-28 DIAGNOSIS — M179 Osteoarthritis of knee, unspecified: Secondary | ICD-10-CM | POA: Diagnosis not present

## 2014-08-11 DIAGNOSIS — I739 Peripheral vascular disease, unspecified: Secondary | ICD-10-CM | POA: Diagnosis not present

## 2014-08-12 DIAGNOSIS — M545 Low back pain: Secondary | ICD-10-CM | POA: Diagnosis not present

## 2014-08-12 DIAGNOSIS — G894 Chronic pain syndrome: Secondary | ICD-10-CM | POA: Diagnosis not present

## 2014-08-12 DIAGNOSIS — M961 Postlaminectomy syndrome, not elsewhere classified: Secondary | ICD-10-CM | POA: Diagnosis not present

## 2014-08-12 DIAGNOSIS — M79651 Pain in right thigh: Secondary | ICD-10-CM | POA: Diagnosis not present

## 2014-08-12 DIAGNOSIS — M47817 Spondylosis without myelopathy or radiculopathy, lumbosacral region: Secondary | ICD-10-CM | POA: Diagnosis not present

## 2014-08-19 ENCOUNTER — Ambulatory Visit: Payer: Self-pay | Admitting: Licensed Clinical Social Worker

## 2014-08-19 ENCOUNTER — Other Ambulatory Visit: Payer: Self-pay | Admitting: Licensed Clinical Social Worker

## 2014-08-19 NOTE — Patient Outreach (Signed)
Assessment:  Previous documentation in [facility] record.   CSW called client on 08/19/14 and spoke via phone with client.  CSW verified identity of client. Client said he drives his car for short distances and receives help from his son in driving longer distances. He said his son helps transport him to and from medical appointments in [place]. Client said he had prescribed medications. He said he is sleeping well. He said he was not using Bi-Pap machine at present to help him with sleep at night.  He said he had talked with his primary doctor about use of Bi-Pap machine for client.  He said he had adequate food supply and received some assistance from meal delivery.  He said he received frozen meals delivered to his home and that these meals last about 14 days for client. He said he is walking with use of a cane. He said he had to have injection periodically in his knee related to pain. He said he has to have injection periodically  in his back to help with pain management.  Client said he can take bath by himself and dress by himself.  He spoke of a fall he had about 4 months ago but said he was feeling better now.He said he will see Dr. Gerarda Fraction, primary doctor within the next 6 weeks.  He said he was not depressed, not sad.  CSW and client completed PHQ-2 for client on 08/19/14.  He said he enjoys Oncologist.  He likes going to races with his sons.  He said he is not using oxygen in the home at present. He said he was not having shortness of breath. He did say he had an inhaler to utilize as prescribed. CSW encouraged client to call CSW at [phone] as needed to discuss clinical social work needs of client.   Plan: Client to take medications as prescribed and to attend scheduled medical appointments. Client to communicate as needed with RN Janalyn Shy to discuss nursing needs of client. CSW to call client in 3 weeks to assess needs of client at that time. Client to  develop budget for client and try to follow budget for client for next 30 days.   [name] [name].[name] [name] MSW, LCSW Licensed Clinical Social Worker [facility] Care Management [phone]

## 2014-08-19 NOTE — Patient Instructions (Signed)
Client to call RN Janalyn Shy to discuss nursing needs of client.  Client to call Dr. Gerarda Fraction to discuss medical needs of client. Client to take medications as prescribed and to attend scheduled medical appointments.

## 2014-08-20 ENCOUNTER — Encounter: Payer: Self-pay | Admitting: Oncology

## 2014-08-26 DIAGNOSIS — M25561 Pain in right knee: Secondary | ICD-10-CM | POA: Diagnosis not present

## 2014-08-26 DIAGNOSIS — M47817 Spondylosis without myelopathy or radiculopathy, lumbosacral region: Secondary | ICD-10-CM | POA: Diagnosis not present

## 2014-08-26 DIAGNOSIS — M170 Bilateral primary osteoarthritis of knee: Secondary | ICD-10-CM | POA: Diagnosis not present

## 2014-08-26 DIAGNOSIS — M25562 Pain in left knee: Secondary | ICD-10-CM | POA: Diagnosis not present

## 2014-08-26 DIAGNOSIS — G894 Chronic pain syndrome: Secondary | ICD-10-CM | POA: Diagnosis not present

## 2014-08-26 DIAGNOSIS — M961 Postlaminectomy syndrome, not elsewhere classified: Secondary | ICD-10-CM | POA: Diagnosis not present

## 2014-09-02 DIAGNOSIS — M170 Bilateral primary osteoarthritis of knee: Secondary | ICD-10-CM | POA: Diagnosis not present

## 2014-09-02 DIAGNOSIS — G894 Chronic pain syndrome: Secondary | ICD-10-CM | POA: Diagnosis not present

## 2014-09-02 DIAGNOSIS — M47817 Spondylosis without myelopathy or radiculopathy, lumbosacral region: Secondary | ICD-10-CM | POA: Diagnosis not present

## 2014-09-02 DIAGNOSIS — M25561 Pain in right knee: Secondary | ICD-10-CM | POA: Diagnosis not present

## 2014-09-02 DIAGNOSIS — M25562 Pain in left knee: Secondary | ICD-10-CM | POA: Diagnosis not present

## 2014-09-09 ENCOUNTER — Ambulatory Visit (HOSPITAL_COMMUNITY)
Admission: RE | Admit: 2014-09-09 | Discharge: 2014-09-09 | Disposition: A | Discharge status: Home / Self Care | Payer: Medicare Other | Source: Ambulatory Visit | Attending: Physical Medicine and Rehabilitation | Admitting: Physical Medicine and Rehabilitation

## 2014-09-09 ENCOUNTER — Other Ambulatory Visit (HOSPITAL_COMMUNITY): Payer: Self-pay | Admitting: Physical Medicine and Rehabilitation

## 2014-09-09 DIAGNOSIS — M25512 Pain in left shoulder: Secondary | ICD-10-CM

## 2014-09-09 DIAGNOSIS — M25562 Pain in left knee: Secondary | ICD-10-CM | POA: Diagnosis not present

## 2014-09-09 DIAGNOSIS — M25561 Pain in right knee: Secondary | ICD-10-CM | POA: Diagnosis not present

## 2014-09-09 DIAGNOSIS — M170 Bilateral primary osteoarthritis of knee: Secondary | ICD-10-CM | POA: Diagnosis not present

## 2014-09-09 DIAGNOSIS — G894 Chronic pain syndrome: Secondary | ICD-10-CM | POA: Diagnosis not present

## 2014-09-10 ENCOUNTER — Encounter: Payer: Self-pay | Admitting: *Deleted

## 2014-09-10 NOTE — Patient Outreach (Signed)
[facility]) Care Management  09/10/2014  [name] [name] 11-Jun-1940 [id number]   [place] Management has been unable to maintain contact with [name] over the last few months. We were originally consulted to assist [name] with CHF Disease Management and [employer] (assistance with financial resources and Medicaid application). [name] engaged with [name] for several months and made progress in the areas of health self management. However, we haven't been able to reach him. We will be happy to re-engage with [name] in the future should care management needs arise and should he be interested in engagement.    [place] Management  [phone]

## 2014-09-15 DIAGNOSIS — M47812 Spondylosis without myelopathy or radiculopathy, cervical region: Secondary | ICD-10-CM | POA: Diagnosis not present

## 2014-09-15 DIAGNOSIS — M25512 Pain in left shoulder: Secondary | ICD-10-CM | POA: Diagnosis not present

## 2014-09-15 DIAGNOSIS — M75102 Unspecified rotator cuff tear or rupture of left shoulder, not specified as traumatic: Secondary | ICD-10-CM | POA: Diagnosis not present

## 2014-09-15 DIAGNOSIS — M542 Cervicalgia: Secondary | ICD-10-CM | POA: Diagnosis not present

## 2014-09-22 ENCOUNTER — Other Ambulatory Visit: Payer: Self-pay | Admitting: Internal Medicine

## 2014-09-23 DIAGNOSIS — R109 Unspecified abdominal pain: Secondary | ICD-10-CM | POA: Diagnosis not present

## 2014-09-23 DIAGNOSIS — J329 Chronic sinusitis, unspecified: Secondary | ICD-10-CM | POA: Diagnosis not present

## 2014-09-23 DIAGNOSIS — R5383 Other fatigue: Secondary | ICD-10-CM | POA: Diagnosis not present

## 2014-09-23 DIAGNOSIS — D696 Thrombocytopenia, unspecified: Secondary | ICD-10-CM | POA: Diagnosis not present

## 2014-09-23 DIAGNOSIS — Z6841 Body Mass Index (BMI) 40.0 and over, adult: Secondary | ICD-10-CM | POA: Diagnosis not present

## 2014-09-23 DIAGNOSIS — E063 Autoimmune thyroiditis: Secondary | ICD-10-CM | POA: Diagnosis not present

## 2014-09-23 DIAGNOSIS — I10 Essential (primary) hypertension: Secondary | ICD-10-CM | POA: Diagnosis not present

## 2014-10-01 DIAGNOSIS — D696 Thrombocytopenia, unspecified: Secondary | ICD-10-CM | POA: Diagnosis not present

## 2014-10-13 DIAGNOSIS — Z1211 Encounter for screening for malignant neoplasm of colon: Secondary | ICD-10-CM | POA: Diagnosis not present

## 2014-10-20 DIAGNOSIS — J209 Acute bronchitis, unspecified: Secondary | ICD-10-CM | POA: Diagnosis not present

## 2014-10-20 DIAGNOSIS — Z6841 Body Mass Index (BMI) 40.0 and over, adult: Secondary | ICD-10-CM | POA: Diagnosis not present

## 2014-10-20 DIAGNOSIS — M353 Polymyalgia rheumatica: Secondary | ICD-10-CM | POA: Diagnosis not present

## 2014-10-20 DIAGNOSIS — Z1211 Encounter for screening for malignant neoplasm of colon: Secondary | ICD-10-CM | POA: Diagnosis not present

## 2014-10-20 DIAGNOSIS — I10 Essential (primary) hypertension: Secondary | ICD-10-CM | POA: Diagnosis not present

## 2014-10-20 DIAGNOSIS — E782 Mixed hyperlipidemia: Secondary | ICD-10-CM | POA: Diagnosis not present

## 2014-10-20 DIAGNOSIS — J019 Acute sinusitis, unspecified: Secondary | ICD-10-CM | POA: Diagnosis not present

## 2014-10-21 DIAGNOSIS — I739 Peripheral vascular disease, unspecified: Secondary | ICD-10-CM | POA: Diagnosis not present

## 2014-10-22 DIAGNOSIS — R109 Unspecified abdominal pain: Secondary | ICD-10-CM | POA: Diagnosis not present

## 2014-11-03 ENCOUNTER — Other Ambulatory Visit: Payer: Self-pay | Admitting: Licensed Clinical Social Worker

## 2014-11-03 ENCOUNTER — Encounter: Payer: Self-pay | Admitting: Licensed Clinical Social Worker

## 2014-11-03 NOTE — Patient Outreach (Signed)
Assessment:    CSW completed chart review on client on 11/03/14. CSW called home phone number of client on 11/03/14.  CSW was not able to speak via phone with client but CSW left phone message for client on 11/03/14 requesting that client please return call to [name] [name] at [phone] to discuss the current needs of client.  CSW called [name] [name], son and emergency contact for client, on 11/03/14. CSW verified identity of [name] [name]. [name] [name] and CSW reviewed current status of client.  [name] [name] reported that client had adequate food supply, had his prescribed medications needed, had adequate transport support to go to and from medical appointments, was in a positive mood and felt good about family support, had seen [name] [name], his primary doctor last week, was walking well with cane or walker and that client could go out for community activities or medical appointments. [name] [name] said that he did not know of any other clinical social work needs of client at this time.  CSW informed [name] [name] on 11/03/14 that CSW had not been able to reach client via phone.  CSW asked [name] [name] to inform client that CSW was discharging client, closing case for client on 11/03/14 due to fact that clinical social work goals for client had been met.  [name] [name] said he would inform client of this information.  CSW also informed [name] [name] that client could communicate with [name] [name] as needed, if client should feel that client needed [facility] program support in the future. [name] [name] has CSW number of [phone].  Client has CSW number of [phone].  [name] [name] informed CSW that he would inform client of the above information on 11/03/14. [name] [name] was appreciative of the nursing and social work support client had received from [facility] program.   Plan: CSW is discharging client on 11/03/14 due to fact that all clinical social work goals for client have been met CSW to inform [name] [name] on 11/03/14 that [facility] discharged client on 11/03/14 due  to fact that all clinical social work goals for client had been met. CSW to send letter to [name] [name] on 11/03/14 informing Dr [name] [name] that case for client is closed due to fact that social work goals for client have been met.  [name] [name].[name] [name] MSW, LCSW Licensed Clinical Social Worker [facility] Care Management [phone]      Plan: Client to take medications as prescribed and to attend scheduled medical appointments.

## 2014-11-04 ENCOUNTER — Encounter: Payer: Self-pay | Admitting: Internal Medicine

## 2014-11-06 NOTE — Patient Outreach (Signed)
[facility]) Care Management  11/06/2014  [name] [name] 05-Apr-1941 [id number]   Notification from Theadore Nan, LCSW to close case due to patient has met goals with [place] Management.  [name] [name]. [place], [place] Management [place] Assistant Phone: [phone] Fax: [phone]

## 2014-11-17 ENCOUNTER — Other Ambulatory Visit: Payer: Self-pay

## 2014-11-25 ENCOUNTER — Ambulatory Visit (INDEPENDENT_AMBULATORY_CARE_PROVIDER_SITE_OTHER): Payer: Medicare Other | Admitting: Urology

## 2014-11-25 DIAGNOSIS — N400 Enlarged prostate without lower urinary tract symptoms: Secondary | ICD-10-CM

## 2014-11-25 DIAGNOSIS — N529 Male erectile dysfunction, unspecified: Secondary | ICD-10-CM

## 2014-11-25 DIAGNOSIS — N3281 Overactive bladder: Secondary | ICD-10-CM

## 2014-11-27 ENCOUNTER — Ambulatory Visit (INDEPENDENT_AMBULATORY_CARE_PROVIDER_SITE_OTHER): Payer: Medicare Other | Admitting: Cardiology

## 2014-11-27 ENCOUNTER — Encounter: Payer: Self-pay | Admitting: Cardiology

## 2014-11-27 ENCOUNTER — Encounter: Payer: Self-pay | Admitting: *Deleted

## 2014-11-27 VITALS — BP 141/86 | HR 84 | Ht 69.0 in | Wt 288.4 lb

## 2014-11-27 DIAGNOSIS — G4733 Obstructive sleep apnea (adult) (pediatric): Secondary | ICD-10-CM

## 2014-11-27 DIAGNOSIS — E785 Hyperlipidemia, unspecified: Secondary | ICD-10-CM | POA: Diagnosis not present

## 2014-11-27 DIAGNOSIS — I5032 Chronic diastolic (congestive) heart failure: Secondary | ICD-10-CM | POA: Diagnosis not present

## 2014-11-27 NOTE — Progress Notes (Signed)
Clinical Summary [name] is a 74 y.o.male seen today for follow up of the following medical problems.   1. Chronic diastolic heart failure  - echo 04/2013 LVEF 41-74%, grade I diastolic dysfunction. RV was poorly visualized.  - previously volume status was labile, admissions with volume overload and dehydration. Has done well on torsemide 40mg  bid, his weights remain stable in the low 280s. He adjusts he diuretic dosing based on daily weight changes. - denies any significant DOE or LE edema. He is limiting sodium intake.  - weights at home stable, 282 on home scale.  - denies any significant SOB or DOE.   2. OSA - patient reports compliance with CPAP   3. Hyperlipidemia - compliant with statin   Past Medical History  Diagnosis Date  . CHF (congestive heart failure)   . Obesity   . Hyperlipidemia   . PUD (peptic ulcer disease)   . GERD (gastroesophageal reflux disease)   . S/P endoscopy 2007, 2012    2007: nl, May 2012: antral erosions  . Osteoarthritis   . S/P colonoscopy 2007, Feb and March 2012    hx of adenomas, left-sided diverticula, On 07/2010 TCS, fresh blood and clot noted coming from TI.  Marland Kitchen Anxiety   . Hypothyroidism     hypothyroid  . Ringing in ears   . History of kidney stones   . History of bladder infections   . Heel spur     right heel  . Collagen vascular disease     history of venous insufficency and LE cellulitis  . Thrombocytopenia 12/30/2011    Stable  . BPH (benign prostatic hyperplasia)   . Hypertension     Sees Dr. Debara Pickett  . Bilateral lower leg cellulitis   . Sleep apnea     uses oxygen 2L via [place] cannot tolerate CPAP - sleep study 2008 [facility] 48.80/hr and 56.70hr during REM  . Complication of anesthesia     due to sleep apnea pt has had difficulty being put under anesthesia  . PONV (postoperative nausea and vomiting)   . Deep venous insufficiency   . Chronic diastolic HF (heart failure)      Allergies  Allergen Reactions  .  Aspirin Other (See Comments)    Nausea and upset stomach    . Nexium [Esomeprazole Magnesium] Other (See Comments)    Patient said it messed his stomach up     Current Outpatient Prescriptions  Medication Sig Dispense Refill  . acetaminophen (TYLENOL) 325 MG tablet Take 325-650 mg by mouth every 6 (six) hours as needed for pain.     . cyclobenzaprine (FLEXERIL) 10 MG tablet Take 1 tablet (10 mg total) by mouth 3 (three) times daily as needed for muscle spasms. 45 tablet 0  . docusate sodium (COLACE) 100 MG capsule Take 100 mg by mouth daily as needed for mild constipation.     Marland Kitchen latanoprost (XALATAN) 0.005 % ophthalmic solution Place 1 drop into both eyes at bedtime.    . metoprolol succinate (TOPROL-XL) 25 MG 24 hr tablet Take 25 mg by mouth daily.    . mirabegron ER (MYRBETRIQ) 25 MG TB24 tablet Take 25 mg by mouth daily.    Marland Kitchen oxyCODONE-acetaminophen (PERCOCET) 10-325 MG per tablet Take 1 tablet by mouth every 6 (six) hours as needed for pain. 90 tablet 0  . pantoprazole (PROTONIX) 40 MG tablet Take 40 mg by mouth 2 (two) times daily.    . peg 3350 powder (MOVIPREP) 100  G SOLR Take by mouth.    Vladimir Faster Glycol-Propyl Glycol (LUBRICANT EYE DROPS) 0.4-0.3 % SOLN Apply 1 drop to eye daily as needed (for lubricant eye drops).    . potassium chloride (KLOR-CON) 20 MEQ packet Take 20 mEq by mouth 2 (two) times daily.    . promethazine (PHENERGAN) 12.5 MG tablet Take 12.5 mg by mouth every 6 (six) hours as needed for nausea or vomiting.    . timolol (BETIMOL) 0.5 % ophthalmic solution Place 1 drop into the left eye 2 (two) times daily.    Marland Kitchen torsemide (DEMADEX) 20 MG tablet Take 40 mg by mouth 2 (two) times daily.     No current facility-administered medications for this visit.     Past Surgical History  Procedure Laterality Date  . Knee arthroscopy    . Rotator cuff repair    . Cholecystectomy    . Lumbar spine surgery    . Small bowel capsule study  07/2011    ?transient focal  ischemia in distal small bowel to explain GI bleeding  . Esophagogastroduodenoscopy  Aug 2013    [facility], single 62mm pedunculated polyp otherwise normal. HYPERPLASTIC, NEGATIVE h.pylori  . Cardiac catheterization  02/27/2007    no sign of CAD, LVH, mod pulm HTN (Dr. Jackie Plum(  . Transurethral resection of prostate    . Transurethral resection of prostate N/A 06/18/2013    Procedure: TRANSURETHRAL RESECTION OF THE PROSTATE (TURP);  Surgeon: Marissa Nestle, MD;  Location: [facility];  Service: Urology;  Laterality: N/A;  . Transthoracic echocardiogram  09/2008    EF 60-65%, mod conc LVH  . Nm myocar perf wall motion  12/2006    dipyridamole myoview - stress images show mild perfusion defect in mid inferior walls with reversibility at rest; mild perfusion defect in mid inferolateral wall at stress with mild defect reversibility, EF 62%, abnormal but low risk study   . Esophagogastroduodenoscopy N/A 05/28/2014    Procedure: ESOPHAGOGASTRODUODENOSCOPY (EGD);  Surgeon: Daneil Dolin, MD;  Location: [facility];  Service: Endoscopy;  Laterality: N/A;  945am - moved to 9:30 - [place] to notify pt  . Savory dilation N/A 05/28/2014    Procedure: SAVORY DILATION;  Surgeon: Daneil Dolin, MD;  Location: [facility];  Service: Endoscopy;  Laterality: N/A;  Venia Minks dilation N/A 05/28/2014    Procedure: Venia Minks DILATION;  Surgeon: Daneil Dolin, MD;  Location: [facility];  Service: Endoscopy;  Laterality: N/A;  . Colonoscopy N/A 05/28/2014    Procedure: COLONOSCOPY;  Surgeon: Daneil Dolin, MD;  Location: [facility];  Service: Endoscopy;  Laterality: N/A;     Allergies  Allergen Reactions  . Aspirin Other (See Comments)    Nausea and upset stomach    . Nexium [Esomeprazole Magnesium] Other (See Comments)    Patient said it messed his stomach up      Family History  Problem Relation Age of Onset  . Colon cancer Father 30    deceased  . Prostate cancer Father   . Pancreatic cancer Mother 52     deceased  . Prostate cancer Brother   . Arthritis Son   . Arthritis Son   . Diabetes Mellitus II Son      Social History Mr. Corning reports that he quit smoking about 41 years ago. His smoking use included Cigars. He started smoking about 55 years ago. He has never used smokeless tobacco. Mr. Pattillo reports that he does not drink alcohol.  Review of Systems CONSTITUTIONAL: No weight loss, fever, chills, weakness or fatigue.  HEENT: Eyes: No visual loss, blurred vision, double vision or yellow sclerae.No hearing loss, sneezing, congestion, runny nose or sore throat.  SKIN: No rash or itching.  CARDIOVASCULAR: per HPI RESPIRATORY: No shortness of breath, cough or sputum.  GASTROINTESTINAL: No anorexia, nausea, vomiting or diarrhea. No abdominal pain or blood.  GENITOURINARY: No burning on urination, no polyuria NEUROLOGICAL: No headache, dizziness, syncope, paralysis, ataxia, numbness or tingling in the extremities. No change in bowel or bladder control.  MUSCULOSKELETAL: No muscle, back pain, joint pain or stiffness.  LYMPHATICS: No enlarged nodes. No history of splenectomy.  PSYCHIATRIC: No history of depression or anxiety.  ENDOCRINOLOGIC: No reports of sweating, cold or heat intolerance. No polyuria or polydipsia.  Marland Kitchen   Physical Examination Filed Vitals:   11/27/14 0848  BP: 141/86  Pulse: 84   Filed Vitals:   11/27/14 0848  Height: 5\' 9"  (1.753 m)  Weight: 288 lb 6.4 oz (130.817 kg)    Gen: resting comfortably, no acute distress HEENT: no scleral icterus, pupils equal round and reactive, no palptable cervical adenopathy,  CV: RRR, no m/r/g, no JVD Resp: Clear to auscultation bilaterally GI: abdomen is soft, non-tender, non-distended, normal bowel sounds, no hepatosplenomegaly MSK: extremities are warm, no edema.  Skin: warm, no rash Neuro:  no focal deficits Psych: appropriate affect   Diagnostic Studies 04/2013 Echo Study Conclusions  - Procedure  narrative: Transthoracic echocardiography. Technically difficult study with reduced echocardiographic windows. - Left ventricle: The cavity size was normal. There was moderate concentric hypertrophy. Systolic function was vigorous. The estimated ejection fraction was in the range of 65% to 70%. Wall motion was normal; there were no regional wall motion abnormalities. Doppler parameters are consistent with abnormal left ventricular relaxation (grade 1 diastolic dysfunction). The E/e' ratio is <10, suggesting normal LV filling pressure. - Left atrium: The atrium was normal in size. - Tricuspid valve: Mild regurgitation. - Pericardium, extracardiac: There was no pericardial effusion.    Assessment and Plan  1. Chronic diastolic heart failure  - volume status well controlled, earlier had issues with varying dehydration and volume overload - continue current meds   2. OSA - continue CPAP  3. Hyperlipidemia - continue current statin,  F/u 6 months      Arnoldo Lenis, M.D.

## 2014-11-27 NOTE — Patient Instructions (Signed)
Your physician recommends that you continue on your current medications as directed. Please refer to the Current Medication list given to you today. Your physician recommends that you schedule a follow-up appointment in: 6 months. You will receive a reminder letter in the mail in about 4 months reminding you to call and schedule your appointment. If you don't receive this letter, please contact our office. 

## 2014-12-03 DIAGNOSIS — G894 Chronic pain syndrome: Secondary | ICD-10-CM | POA: Diagnosis not present

## 2014-12-03 DIAGNOSIS — Z6830 Body mass index (BMI) 30.0-30.9, adult: Secondary | ICD-10-CM | POA: Diagnosis not present

## 2014-12-03 DIAGNOSIS — E782 Mixed hyperlipidemia: Secondary | ICD-10-CM | POA: Diagnosis not present

## 2014-12-03 DIAGNOSIS — M1991 Primary osteoarthritis, unspecified site: Secondary | ICD-10-CM | POA: Diagnosis not present

## 2014-12-03 DIAGNOSIS — Z1389 Encounter for screening for other disorder: Secondary | ICD-10-CM | POA: Diagnosis not present

## 2014-12-08 ENCOUNTER — Encounter: Payer: Self-pay | Admitting: Gastroenterology

## 2014-12-08 ENCOUNTER — Ambulatory Visit (INDEPENDENT_AMBULATORY_CARE_PROVIDER_SITE_OTHER): Payer: Medicare Other | Admitting: Gastroenterology

## 2014-12-08 VITALS — BP 154/82 | HR 94 | Temp 96.6°F | Ht 68.0 in | Wt 292.2 lb

## 2014-12-08 DIAGNOSIS — R110 Nausea: Secondary | ICD-10-CM | POA: Diagnosis not present

## 2014-12-08 DIAGNOSIS — K219 Gastro-esophageal reflux disease without esophagitis: Secondary | ICD-10-CM

## 2014-12-08 DIAGNOSIS — K5900 Constipation, unspecified: Secondary | ICD-10-CM | POA: Diagnosis not present

## 2014-12-08 NOTE — Progress Notes (Signed)
CC'ED TO PCP 

## 2014-12-08 NOTE — Progress Notes (Signed)
Primary Care Physician: [name]., MD  Primary Gastroenterologist:  Garfield Cornea, MD   Chief Complaint  Patient presents with  . Follow-up    HPI: [name] [name] is a 74 y.o. male here for six-month follow-up of GERD. He underwent EGD and colonoscopy in January 2016 for esophageal dysphagia, GERD, hematochezia, history of colonic adenoma. Noted to have noncritical Schatzki ring status post dilation. Mild chronic inflammation noted on gastric biopsy. No H pylori. Noted to have internal hemorrhoids, scattered left-sided diverticula, distal transverse polyps were tubular adenomas. Next colonoscopy planned for 5 years.  Swallowing better. Takes phenergan every morning. Wakes up every morning with nausea. Prior extensive evaluation including gastric emptying study, EGD, colonoscopy, capsule study both here and at [facility]. BM sometimes with constipation. BM every day or every other day. Has to take colace, two every night and two at lunch. Sometimes don't work very good. Percocet about 1 per day, sometimes none. No heartburn like used to. Aside from nausea he feels well. Energy and works well for him.     Current Outpatient Prescriptions  Medication Sig Dispense Refill  . acetaminophen (TYLENOL) 325 MG tablet Take 325-650 mg by mouth every 6 (six) hours as needed for pain.     . cyclobenzaprine (FLEXERIL) 10 MG tablet Take 1 tablet (10 mg total) by mouth 3 (three) times daily as needed for muscle spasms. 45 tablet 0  . docusate sodium (COLACE) 100 MG capsule Take 100 mg by mouth daily as needed for mild constipation.     Marland Kitchen latanoprost (XALATAN) 0.005 % ophthalmic solution Place 1 drop into both eyes at bedtime.    . metoprolol succinate (TOPROL-XL) 25 MG 24 hr tablet Take 25 mg by mouth daily.    . mirabegron ER (MYRBETRIQ) 25 MG TB24 tablet Take 25 mg by mouth daily.    Marland Kitchen oxyCODONE-acetaminophen (PERCOCET) 10-325 MG per tablet Take 1 tablet by mouth every 6 (six) hours as needed  for pain. 90 tablet 0  . pantoprazole (PROTONIX) 40 MG tablet Take 40 mg by mouth 2 (two) times daily.    Vladimir Faster Glycol-Propyl Glycol (LUBRICANT EYE DROPS) 0.4-0.3 % SOLN Apply 1 drop to eye daily as needed (for lubricant eye drops).    . potassium chloride (KLOR-CON) 20 MEQ packet Take 20 mEq by mouth 2 (two) times daily.    . promethazine (PHENERGAN) 12.5 MG tablet Take 12.5 mg by mouth every 6 (six) hours as needed for nausea or vomiting.    . timolol (BETIMOL) 0.5 % ophthalmic solution Place 1 drop into the left eye 2 (two) times daily.    Marland Kitchen torsemide (DEMADEX) 20 MG tablet Take 40 mg by mouth 2 (two) times daily.     No current facility-administered medications for this visit.    Allergies as of 12/08/2014 - Review Complete 12/08/2014  Allergen Reaction Noted  . Aspirin Other (See Comments)   . Nexium [esomeprazole magnesium] Other (See Comments) 11/14/2013    ROS:  General: Negative for anorexia, weight loss, fever, chills, fatigue, weakness. ENT: Negative for hoarseness, difficulty swallowing , nasal congestion. CV: Negative for chest pain, angina, palpitations, dyspnea on exertion, peripheral edema.  Respiratory: Negative for dyspnea at rest, dyspnea on exertion, cough, sputum, wheezing.  GI: See history of present illness. GU:  Negative for dysuria, hematuria, urinary incontinence, urinary frequency, nocturnal urination.  Endo: Negative for unusual weight change.    Physical Examination:   BP 154/82 mmHg  Pulse 94  Temp(Src) 96.6 F (  35.9 C)  Ht 5\' 8"  (1.727 m)  Wt 292 lb 3.2 oz (132.541 kg)  BMI 44.44 kg/m2  General: Well-nourished, well-developed in no acute distress.  Eyes: No icterus. Mouth: Oropharyngeal mucosa moist and pink , no lesions erythema or exudate. Lungs: Clear to auscultation bilaterally.  Heart: Regular rate and rhythm, no murmurs rubs or gallops.  Abdomen: Bowel sounds are normal, nontender, nondistended, no hepatosplenomegaly or masses, no  abdominal bruits or hernia , no rebound or guarding.   Extremities: No lower extremity edema. No clubbing or deformities. Neuro: Alert and oriented x 4   Skin: Warm and dry, no jaundice.   Psych: Alert and cooperative, normal mood and affect.  Labs:  Labs from 10/22/2014 Hemoglobin 14.6, hematocrit 43.8, MCV 76, white blood cell count 9100, platelets 118,000.  February 2016, iron 36, TIBC 244, ferritin 33 Imaging Studies: No results found.

## 2014-12-08 NOTE — Assessment & Plan Note (Signed)
Currently doing reasonably well with chronic vague nausea dating back for several years. Extensive evaluation as previously outlined. Recent EGD and colonoscopy reassuring. No significant weight loss. No vomiting. Recommend continue pantoprazole 40 mg twice daily. He will call if he has worsening symptoms. Otherwise return back for evaluation in 6 months.

## 2014-12-08 NOTE — Patient Instructions (Signed)
1. Continue pantoprazole twice daily as before.  2. Return for follow up in six months.  3. Call sooner if needed.

## 2014-12-08 NOTE — Assessment & Plan Note (Signed)
Currently manageable with Colace. He does not want to change therapy at this time. Discussed options such as Linzess, Amitiza, Movantik, Miralax.

## 2014-12-17 DIAGNOSIS — H4011X3 Primary open-angle glaucoma, severe stage: Secondary | ICD-10-CM | POA: Diagnosis not present

## 2014-12-22 DIAGNOSIS — M47816 Spondylosis without myelopathy or radiculopathy, lumbar region: Secondary | ICD-10-CM | POA: Diagnosis not present

## 2014-12-22 DIAGNOSIS — Z6841 Body Mass Index (BMI) 40.0 and over, adult: Secondary | ICD-10-CM | POA: Diagnosis not present

## 2014-12-23 ENCOUNTER — Other Ambulatory Visit: Payer: Self-pay | Admitting: Neurosurgery

## 2014-12-23 DIAGNOSIS — M47816 Spondylosis without myelopathy or radiculopathy, lumbar region: Secondary | ICD-10-CM

## 2014-12-25 ENCOUNTER — Ambulatory Visit
Admission: RE | Admit: 2014-12-25 | Discharge: 2014-12-25 | Disposition: A | Discharge status: Home / Self Care | Payer: Medicare Other | Source: Ambulatory Visit | Attending: Neurosurgery | Admitting: Neurosurgery

## 2014-12-25 DIAGNOSIS — M47816 Spondylosis without myelopathy or radiculopathy, lumbar region: Secondary | ICD-10-CM

## 2014-12-25 DIAGNOSIS — M47817 Spondylosis without myelopathy or radiculopathy, lumbosacral region: Secondary | ICD-10-CM | POA: Diagnosis not present

## 2014-12-25 DIAGNOSIS — M4806 Spinal stenosis, lumbar region: Secondary | ICD-10-CM | POA: Diagnosis not present

## 2014-12-25 DIAGNOSIS — M5127 Other intervertebral disc displacement, lumbosacral region: Secondary | ICD-10-CM | POA: Diagnosis not present

## 2014-12-25 MED ORDER — GADOBENATE DIMEGLUMINE 529 MG/ML IV SOLN
20.0000 mL | Freq: Once | INTRAVENOUS | Status: AC | PRN
  Administered 2014-12-25: 20 mL via INTRAVENOUS

## 2014-12-30 DIAGNOSIS — I739 Peripheral vascular disease, unspecified: Secondary | ICD-10-CM | POA: Diagnosis not present

## 2015-01-05 ENCOUNTER — Other Ambulatory Visit: Payer: Medicare Other

## 2015-01-12 DIAGNOSIS — M47816 Spondylosis without myelopathy or radiculopathy, lumbar region: Secondary | ICD-10-CM | POA: Diagnosis not present

## 2015-01-15 ENCOUNTER — Encounter (HOSPITAL_COMMUNITY): Payer: Medicare Other | Attending: Internal Medicine

## 2015-01-15 DIAGNOSIS — M47816 Spondylosis without myelopathy or radiculopathy, lumbar region: Secondary | ICD-10-CM | POA: Diagnosis not present

## 2015-01-15 DIAGNOSIS — M5416 Radiculopathy, lumbar region: Secondary | ICD-10-CM | POA: Diagnosis not present

## 2015-01-15 DIAGNOSIS — M5126 Other intervertebral disc displacement, lumbar region: Secondary | ICD-10-CM | POA: Diagnosis not present

## 2015-01-19 DIAGNOSIS — H4011X3 Primary open-angle glaucoma, severe stage: Secondary | ICD-10-CM | POA: Diagnosis not present

## 2015-01-21 DIAGNOSIS — M5416 Radiculopathy, lumbar region: Secondary | ICD-10-CM | POA: Diagnosis not present

## 2015-01-21 DIAGNOSIS — Z6841 Body Mass Index (BMI) 40.0 and over, adult: Secondary | ICD-10-CM | POA: Diagnosis not present

## 2015-01-21 DIAGNOSIS — M5126 Other intervertebral disc displacement, lumbar region: Secondary | ICD-10-CM | POA: Diagnosis not present

## 2015-01-21 DIAGNOSIS — I10 Essential (primary) hypertension: Secondary | ICD-10-CM | POA: Diagnosis not present

## 2015-01-27 ENCOUNTER — Encounter (HOSPITAL_COMMUNITY): Payer: Self-pay

## 2015-02-02 ENCOUNTER — Encounter (HOSPITAL_COMMUNITY): Payer: Medicare Other | Attending: Hematology & Oncology

## 2015-02-02 ENCOUNTER — Telehealth (HOSPITAL_COMMUNITY): Payer: Self-pay | Admitting: Emergency Medicine

## 2015-02-02 DIAGNOSIS — D72829 Elevated white blood cell count, unspecified: Secondary | ICD-10-CM | POA: Diagnosis not present

## 2015-02-02 DIAGNOSIS — D696 Thrombocytopenia, unspecified: Secondary | ICD-10-CM | POA: Diagnosis not present

## 2015-02-02 LAB — CBC WITH DIFFERENTIAL/PLATELET
Basophils Absolute: 0.1 10*3/uL (ref 0.0–0.1)
Basophils Relative: 0 % (ref 0–1)
EOS ABS: 0.1 10*3/uL (ref 0.0–0.7)
Eosinophils Relative: 0 % (ref 0–5)
HEMATOCRIT: 48.8 % (ref 39.0–52.0)
Hemoglobin: 16.1 g/dL (ref 13.0–17.0)
LYMPHS ABS: 1 10*3/uL (ref 0.7–4.0)
LYMPHS PCT: 7 % — AB (ref 12–46)
MCH: 26 pg (ref 26.0–34.0)
MCHC: 33 g/dL (ref 30.0–36.0)
MCV: 78.7 fL (ref 78.0–100.0)
MONOS PCT: 11 % (ref 3–12)
Monocytes Absolute: 1.7 10*3/uL — ABNORMAL HIGH (ref 0.1–1.0)
NEUTROS ABS: 12.8 10*3/uL — AB (ref 1.7–7.7)
NEUTROS PCT: 82 % — AB (ref 43–77)
Platelets: 131 10*3/uL — ABNORMAL LOW (ref 150–400)
RBC: 6.2 MIL/uL — AB (ref 4.22–5.81)
RDW: 17.7 % — ABNORMAL HIGH (ref 11.5–15.5)
WBC: 15.7 10*3/uL — ABNORMAL HIGH (ref 4.0–10.5)

## 2015-02-02 NOTE — Telephone Encounter (Signed)
Called to notify pt that lab work was stable

## 2015-02-02 NOTE — Progress Notes (Signed)
LABS DRAWN

## 2015-02-02 NOTE — Telephone Encounter (Signed)
-----   Message from Baird Cancer, PA-C sent at 02/02/2015  3:27 PM EDT ----- Stable

## 2015-02-18 DIAGNOSIS — M545 Low back pain: Secondary | ICD-10-CM | POA: Diagnosis not present

## 2015-02-18 DIAGNOSIS — M961 Postlaminectomy syndrome, not elsewhere classified: Secondary | ICD-10-CM | POA: Diagnosis not present

## 2015-02-18 DIAGNOSIS — M25561 Pain in right knee: Secondary | ICD-10-CM | POA: Diagnosis not present

## 2015-02-18 DIAGNOSIS — M170 Bilateral primary osteoarthritis of knee: Secondary | ICD-10-CM | POA: Diagnosis not present

## 2015-02-18 DIAGNOSIS — M25562 Pain in left knee: Secondary | ICD-10-CM | POA: Diagnosis not present

## 2015-02-26 DIAGNOSIS — Z23 Encounter for immunization: Secondary | ICD-10-CM | POA: Diagnosis not present

## 2015-03-02 ENCOUNTER — Emergency Department (HOSPITAL_COMMUNITY): Payer: Medicare Other

## 2015-03-02 ENCOUNTER — Encounter (HOSPITAL_COMMUNITY): Payer: Self-pay | Admitting: Emergency Medicine

## 2015-03-02 ENCOUNTER — Emergency Department (HOSPITAL_COMMUNITY)
Admission: EM | Admit: 2015-03-02 | Discharge: 2015-03-02 | Disposition: A | Discharge status: Home / Self Care | Payer: Medicare Other | Attending: Emergency Medicine | Admitting: Emergency Medicine

## 2015-03-02 DIAGNOSIS — H5310 Unspecified subjective visual disturbances: Secondary | ICD-10-CM | POA: Diagnosis not present

## 2015-03-02 DIAGNOSIS — G4730 Sleep apnea, unspecified: Secondary | ICD-10-CM | POA: Diagnosis not present

## 2015-03-02 DIAGNOSIS — Z87438 Personal history of other diseases of male genital organs: Secondary | ICD-10-CM | POA: Insufficient documentation

## 2015-03-02 DIAGNOSIS — Z8711 Personal history of peptic ulcer disease: Secondary | ICD-10-CM | POA: Diagnosis not present

## 2015-03-02 DIAGNOSIS — I5032 Chronic diastolic (congestive) heart failure: Secondary | ICD-10-CM | POA: Insufficient documentation

## 2015-03-02 DIAGNOSIS — Z87891 Personal history of nicotine dependence: Secondary | ICD-10-CM | POA: Insufficient documentation

## 2015-03-02 DIAGNOSIS — I509 Heart failure, unspecified: Secondary | ICD-10-CM | POA: Diagnosis not present

## 2015-03-02 DIAGNOSIS — R51 Headache: Secondary | ICD-10-CM | POA: Diagnosis present

## 2015-03-02 DIAGNOSIS — Z87440 Personal history of urinary (tract) infections: Secondary | ICD-10-CM | POA: Insufficient documentation

## 2015-03-02 DIAGNOSIS — Z872 Personal history of diseases of the skin and subcutaneous tissue: Secondary | ICD-10-CM | POA: Diagnosis not present

## 2015-03-02 DIAGNOSIS — Z79899 Other long term (current) drug therapy: Secondary | ICD-10-CM | POA: Insufficient documentation

## 2015-03-02 DIAGNOSIS — H538 Other visual disturbances: Secondary | ICD-10-CM | POA: Diagnosis not present

## 2015-03-02 DIAGNOSIS — K219 Gastro-esophageal reflux disease without esophagitis: Secondary | ICD-10-CM | POA: Insufficient documentation

## 2015-03-02 DIAGNOSIS — I10 Essential (primary) hypertension: Secondary | ICD-10-CM | POA: Diagnosis not present

## 2015-03-02 DIAGNOSIS — Z87448 Personal history of other diseases of urinary system: Secondary | ICD-10-CM | POA: Insufficient documentation

## 2015-03-02 DIAGNOSIS — E669 Obesity, unspecified: Secondary | ICD-10-CM | POA: Insufficient documentation

## 2015-03-02 DIAGNOSIS — H5350 Unspecified color vision deficiencies: Secondary | ICD-10-CM

## 2015-03-02 DIAGNOSIS — M1990 Unspecified osteoarthritis, unspecified site: Secondary | ICD-10-CM | POA: Insufficient documentation

## 2015-03-02 DIAGNOSIS — Z862 Personal history of diseases of the blood and blood-forming organs and certain disorders involving the immune mechanism: Secondary | ICD-10-CM | POA: Diagnosis not present

## 2015-03-02 DIAGNOSIS — R519 Headache, unspecified: Secondary | ICD-10-CM

## 2015-03-02 DIAGNOSIS — R42 Dizziness and giddiness: Secondary | ICD-10-CM | POA: Diagnosis not present

## 2015-03-02 NOTE — Discharge Instructions (Signed)
Follow-up with your ophthalmologist Wednesday afternoon. They have informed me that they will call you to arrange this appointment.   General Headache Without Cause A headache is pain or discomfort felt around the head or neck area. The specific cause of a headache may not be found. There are many causes and types of headaches. A few common ones are:  Tension headaches.  Migraine headaches.  Cluster headaches.  Chronic daily headaches. HOME CARE INSTRUCTIONS  Watch your condition for any changes. Take these steps to help with your condition: Managing Pain  Take over-the-counter and prescription medicines only as told by your health care provider.  Lie down in a dark, quiet room when you have a headache.  If directed, apply ice to the head and neck area:  Put ice in a plastic bag.  Place a towel between your skin and the bag.  Leave the ice on for 20 minutes, 2-3 times per day.  Use a heating pad or hot shower to apply heat to the head and neck area as told by your health care provider.  Keep lights dim if bright lights bother you or make your headaches worse. Eating and Drinking  Eat meals on a regular schedule.  Limit alcohol use.  Decrease the amount of caffeine you drink, or stop drinking caffeine. General Instructions  Keep all follow-up visits as told by your health care provider. This is important.  Keep a headache journal to help find out what may trigger your headaches. For example, write down:  What you eat and drink.  How much sleep you get.  Any change to your diet or medicines.  Try massage or other relaxation techniques.  Limit stress.  Sit up straight, and do not tense your muscles.  Do not use tobacco products, including cigarettes, chewing tobacco, or e-cigarettes. If you need help quitting, ask your health care provider.  Exercise regularly as told by your health care provider.  Sleep on a regular schedule. Get 7-9 hours of sleep, or the  amount recommended by your health care provider. SEEK MEDICAL CARE IF:   Your symptoms are not helped by medicine.  You have a headache that is different from the usual headache.  You have nausea or you vomit.  You have a fever. SEEK IMMEDIATE MEDICAL CARE IF:   Your headache becomes severe.  You have repeated vomiting.  You have a stiff neck.  You have a loss of vision.  You have problems with speech.  You have pain in the eye or ear.  You have muscular weakness or loss of muscle control.  You lose your balance or have trouble walking.  You feel faint or pass out.  You have confusion.   This information is not intended to replace advice given to you by your health care provider. Make sure you discuss any questions you have with your health care provider.   Document Released: 05/09/2005 Document Revised: 01/28/2015 Document Reviewed: 09/01/2014 Elsevier Interactive Patient Education [employer].

## 2015-03-02 NOTE — ED Provider Notes (Signed)
CSN: [id number]     Arrival date & time 03/02/15  1208 History   First MD Initiated Contact with Patient 03/02/15 1451     Chief Complaint  Patient presents with  . Headache     (Consider location/radiation/quality/duration/timing/severity/associated sxs/prior Treatment) HPI Comments: Patient is a 74 year old male with history of hypertension, congestive heart failure, and cataract surgery. He presents for evaluation of pressure behind his eyes and "seeing colors" in his vision. He reports headache. He denies any injury or trauma. He denies any fevers, chills, or congestion.  He spoke with his ophthalmologist from [facility] who advised him to come to the ER for a CT scan to rule out the possibility of stroke. He denies any arm or leg weakness or numbness.  Patient is a 74 y.o. male presenting with headaches. The history is provided by the patient.  Headache Pain location:  Frontal Quality:  Dull Radiates to:  Does not radiate Duration:  3 days Timing:  Constant Progression:  Worsening Chronicity:  New Similar to prior headaches: no   Context: not exposure to bright light and not caffeine   Relieved by:  Nothing Worsened by:  Nothing Ineffective treatments:  None tried   Past Medical History  Diagnosis Date  . CHF (congestive heart failure) ([place])   . Obesity   . Hyperlipidemia   . PUD (peptic ulcer disease)   . GERD (gastroesophageal reflux disease)   . S/P endoscopy 2007, 2012    2007: nl, May 2012: antral erosions  . Osteoarthritis   . S/P colonoscopy 2007, Feb and March 2012    hx of adenomas, left-sided diverticula, On 07/2010 TCS, fresh blood and clot noted coming from TI.  Marland Kitchen Anxiety   . Hypothyroidism     hypothyroid  . Ringing in ears   . History of kidney stones   . History of bladder infections   . Heel spur     right heel  . Collagen vascular disease ([place])     history of venous insufficency and LE cellulitis  . Thrombocytopenia ([place]) 12/30/2011   Stable  . BPH (benign prostatic hyperplasia)   . Hypertension     Sees Dr. Debara Pickett  . Bilateral lower leg cellulitis   . Sleep apnea     uses oxygen 2L via [place] cannot tolerate CPAP - sleep study 2008 AHI 48.80/hr and 56.70hr during REM  . Complication of anesthesia     due to sleep apnea pt has had difficulty being put under anesthesia  . PONV (postoperative nausea and vomiting)   . Deep venous insufficiency   . Chronic diastolic HF (heart failure) [facility])    Past Surgical History  Procedure Laterality Date  . Knee arthroscopy    . Rotator cuff repair    . Cholecystectomy    . Lumbar spine surgery    . Small bowel capsule study  07/2011    ?transient focal ischemia in distal small bowel to explain GI bleeding  . Esophagogastroduodenoscopy  Aug 2013    [facility], single 7mm pedunculated polyp otherwise normal. HYPERPLASTIC, NEGATIVE h.pylori  . Cardiac catheterization  02/27/2007    no sign of CAD, LVH, mod pulm HTN (Dr. Jackie Plum(  . Transurethral resection of prostate    . Transurethral resection of prostate N/A 06/18/2013    Procedure: TRANSURETHRAL RESECTION OF THE PROSTATE (TURP);  Surgeon: Marissa Nestle, MD;  Location: [facility] ORS;  Service: Urology;  Laterality: N/A;  . Transthoracic echocardiogram  09/2008    EF 60-65%,  mod conc LVH  . Nm myocar perf wall motion  12/2006    dipyridamole myoview - stress images show mild perfusion defect in mid inferior walls with reversibility at rest; mild perfusion defect in mid inferolateral wall at stress with mild defect reversibility, EF 62%, abnormal but low risk study   . Esophagogastroduodenoscopy N/A 05/28/2014    RMR:non-critical Schatzki's ring/HH. Benign gastritis  . Savory dilation N/A 05/28/2014    Procedure: SAVORY DILATION;  Surgeon: Daneil Dolin, MD;  Location: [facility] ENDO SUITE;  Service: Endoscopy;  Laterality: N/A;  Venia Minks dilation N/A 05/28/2014    Procedure: Venia Minks DILATION;  Surgeon: Daneil Dolin, MD;  Location: [facility] END[facility];   Service: Endoscopy;  Laterality: N/A;  . Colonoscopy N/A 05/28/2014    WEX:HBZJIRCV colonic polyps/colonic diverticulosis. Tubular adenomas. Next colonoscopy January 2021   Family History  Problem Relation Age of Onset  . Colon cancer Father 41    deceased  . Prostate cancer Father   . Pancreatic cancer Mother 24    deceased  . Prostate cancer Brother   . Arthritis Son   . Arthritis Son   . Diabetes Mellitus II Son    Social History  Substance Use Topics  . Smoking status: Former Smoker -- 8 years    Types: Cigars    Start date: 11/20/1959    Quit date: 08/10/1973  . Smokeless tobacco: Never Used  . Alcohol Use: No    Review of Systems  Neurological: Positive for headaches.  All other systems reviewed and are negative.     Allergies  Aspirin and Nexium  Home Medications   Prior to Admission medications   Medication Sig Start Date End Date Taking? Authorizing Provider  latanoprost (XALATAN) 0.005 % ophthalmic solution Place 1 drop into both eyes at bedtime.   Yes Historical Provider, MD  metoprolol succinate (TOPROL-XL) 25 MG 24 hr tablet Take 25 mg by mouth daily.   Yes Historical Provider, MD  mirabegron ER (MYRBETRIQ) 25 MG TB24 tablet Take 25 mg by mouth daily.   Yes Historical Provider, MD  pantoprazole (PROTONIX) 40 MG tablet Take 40 mg by mouth 2 (two) times daily.   Yes Historical Provider, MD  potassium chloride (KLOR-CON) 20 MEQ packet Take 20 mEq by mouth 2 (two) times daily. 11/14/13  Yes Arnoldo Lenis, MD  timolol (BETIMOL) 0.5 % ophthalmic solution Place 1 drop into the left eye 2 (two) times daily.   Yes Historical Provider, MD  torsemide (DEMADEX) 20 MG tablet Take 40 mg by mouth 2 (two) times daily.   Yes Historical Provider, MD  acetaminophen (TYLENOL) 325 MG tablet Take 325-650 mg by mouth every 6 (six) hours as needed for pain.     Historical Provider, MD  cyclobenzaprine (FLEXERIL) 10 MG tablet Take 1 tablet (10 mg total) by mouth 3 (three) times  daily as needed for muscle spasms. Patient not taking: Reported on 03/02/2015 08/31/12   Ashok Pall, MD  docusate sodium (COLACE) 100 MG capsule Take 100 mg by mouth daily as needed for mild constipation.     Historical Provider, MD  oxyCODONE-acetaminophen (PERCOCET) 10-325 MG per tablet Take 1 tablet by mouth every 6 (six) hours as needed for pain. Patient not taking: Reported on 03/02/2015 08/24/13   Theodis Blaze, MD  Polyethyl Glycol-Propyl Glycol (LUBRICANT EYE DROPS) 0.4-0.3 % SOLN Apply 1 drop to eye daily as needed (for lubricant eye drops).    Historical Provider, MD  promethazine (PHENERGAN) 12.5 MG tablet  Take 12.5 mg by mouth every 6 (six) hours as needed for nausea or vomiting.    Historical Provider, MD   BP 150/84 mmHg  Pulse 69  Temp(Src) 98.1 F (36.7 C) (Oral)  Resp 14  Ht 5\' 9"  (1.753 m)  Wt 275 lb (124.739 kg)  BMI 40.59 kg/m2  SpO2 99% Physical Exam  Constitutional: He is oriented to person, place, and time. He appears well-developed and well-nourished. No distress.  HENT:  Head: Normocephalic.  Mouth/Throat: Oropharynx is clear and moist.  Eyes: EOM are normal. Pupils are equal, round, and reactive to light.  Bilateral pupils are equal and reactive. The anterior chambers are clear. Funduscopic exam is unremarkable, however somewhat limited.  Neck: Normal range of motion. Neck supple.  Cardiovascular: Normal rate, regular rhythm and normal heart sounds.   No murmur heard. Pulmonary/Chest: Effort normal and breath sounds normal. No respiratory distress. He has no wheezes.  Abdominal: Soft. Bowel sounds are normal. He exhibits no distension. There is no tenderness. There is no rebound.  Musculoskeletal: Normal range of motion. He exhibits no edema.  Neurological: He is alert and oriented to person, place, and time. No cranial nerve deficit. He exhibits normal muscle tone. Coordination normal.  Skin: Skin is warm and dry. He is not diaphoretic.  Nursing note and  vitals reviewed.   ED Course  Procedures (including critical care time) Labs Review Labs Reviewed - No data to display  Imaging Review Ct Head Wo Contrast  03/02/2015   CLINICAL DATA:  Right-sided headache. Nausea. Right neck pain and dizziness.  EXAM: CT HEAD WITHOUT CONTRAST  TECHNIQUE: Contiguous axial images were obtained from the base of the skull through the vertex without intravenous contrast.  COMPARISON:  05/30/2014  FINDINGS: The brainstem, cerebellum, cerebral peduncles, thalamus, basal ganglia, basilar cisterns, and ventricular system appear within normal limits. No intracranial hemorrhage, mass lesion, or acute CVA.  IMPRESSION: No significant abnormality identified.   Electronically Signed   By: Van Clines M.D.   On: 03/02/2015 12:51   I have personally reviewed and evaluated these images and lab results as part of my medical decision-making.   EKG Interpretation None      MDM   Final diagnoses:  None    CT scan of the head is unremarkable and his neurologic exam is nonfocal. I find nothing abnormal with his eye exam. I have spoken with the ophthalmology office and they will make arrangements for him to be evaluated Wednesday afternoon when his ophthalmologist is in the office. They will call him at home to arrange this appointment.    Veryl Speak, MD 03/02/15 [phone]

## 2015-03-02 NOTE — ED Notes (Signed)
PT c/o headache x3 days with worsening in right eye vision with hx of cornea surgery on same eye. PT states taking tylenol at home today.

## 2015-03-04 DIAGNOSIS — Z6841 Body Mass Index (BMI) 40.0 and over, adult: Secondary | ICD-10-CM | POA: Diagnosis not present

## 2015-03-04 DIAGNOSIS — H5711 Ocular pain, right eye: Secondary | ICD-10-CM | POA: Diagnosis not present

## 2015-03-04 DIAGNOSIS — M316 Other giant cell arteritis: Secondary | ICD-10-CM | POA: Diagnosis not present

## 2015-03-04 DIAGNOSIS — Z1389 Encounter for screening for other disorder: Secondary | ICD-10-CM | POA: Diagnosis not present

## 2015-03-04 DIAGNOSIS — Z23 Encounter for immunization: Secondary | ICD-10-CM | POA: Diagnosis not present

## 2015-03-06 ENCOUNTER — Inpatient Hospital Stay (HOSPITAL_COMMUNITY)
Admission: EM | Admit: 2015-03-06 | Discharge: 2015-03-08 | DRG: 155 | Disposition: A | Discharge status: Home / Self Care | Payer: Medicare Other | Attending: Internal Medicine | Admitting: Internal Medicine

## 2015-03-06 ENCOUNTER — Encounter (HOSPITAL_COMMUNITY): Payer: Self-pay | Admitting: Emergency Medicine

## 2015-03-06 ENCOUNTER — Emergency Department (HOSPITAL_COMMUNITY): Payer: Medicare Other

## 2015-03-06 DIAGNOSIS — E785 Hyperlipidemia, unspecified: Secondary | ICD-10-CM | POA: Diagnosis present

## 2015-03-06 DIAGNOSIS — K1120 Sialoadenitis, unspecified: Secondary | ICD-10-CM | POA: Insufficient documentation

## 2015-03-06 DIAGNOSIS — I10 Essential (primary) hypertension: Secondary | ICD-10-CM | POA: Diagnosis present

## 2015-03-06 DIAGNOSIS — Z6841 Body Mass Index (BMI) 40.0 and over, adult: Secondary | ICD-10-CM | POA: Diagnosis not present

## 2015-03-06 DIAGNOSIS — T8089XA Other complications following infusion, transfusion and therapeutic injection, initial encounter: Secondary | ICD-10-CM | POA: Diagnosis present

## 2015-03-06 DIAGNOSIS — Z8042 Family history of malignant neoplasm of prostate: Secondary | ICD-10-CM

## 2015-03-06 DIAGNOSIS — K219 Gastro-esophageal reflux disease without esophagitis: Secondary | ICD-10-CM | POA: Diagnosis present

## 2015-03-06 DIAGNOSIS — D696 Thrombocytopenia, unspecified: Secondary | ICD-10-CM | POA: Diagnosis not present

## 2015-03-06 DIAGNOSIS — M1990 Unspecified osteoarthritis, unspecified site: Secondary | ICD-10-CM | POA: Diagnosis present

## 2015-03-06 DIAGNOSIS — Y848 Other medical procedures as the cause of abnormal reaction of the patient, or of later complication, without mention of misadventure at the time of the procedure: Secondary | ICD-10-CM | POA: Diagnosis present

## 2015-03-06 DIAGNOSIS — T8089XS Other complications following infusion, transfusion and therapeutic injection, sequela: Secondary | ICD-10-CM | POA: Diagnosis not present

## 2015-03-06 DIAGNOSIS — Z800 Family history of malignant neoplasm of digestive organs: Secondary | ICD-10-CM

## 2015-03-06 DIAGNOSIS — K1121 Acute sialoadenitis: Secondary | ICD-10-CM | POA: Diagnosis not present

## 2015-03-06 DIAGNOSIS — R600 Localized edema: Secondary | ICD-10-CM | POA: Diagnosis present

## 2015-03-06 DIAGNOSIS — Z87891 Personal history of nicotine dependence: Secondary | ICD-10-CM

## 2015-03-06 DIAGNOSIS — H9202 Otalgia, left ear: Secondary | ICD-10-CM | POA: Diagnosis not present

## 2015-03-06 DIAGNOSIS — I110 Hypertensive heart disease with heart failure: Secondary | ICD-10-CM | POA: Diagnosis present

## 2015-03-06 DIAGNOSIS — I5032 Chronic diastolic (congestive) heart failure: Secondary | ICD-10-CM | POA: Diagnosis present

## 2015-03-06 DIAGNOSIS — R51 Headache: Secondary | ICD-10-CM | POA: Diagnosis not present

## 2015-03-06 DIAGNOSIS — L03114 Cellulitis of left upper limb: Secondary | ICD-10-CM | POA: Diagnosis present

## 2015-03-06 DIAGNOSIS — R221 Localized swelling, mass and lump, neck: Secondary | ICD-10-CM | POA: Diagnosis not present

## 2015-03-06 DIAGNOSIS — Z833 Family history of diabetes mellitus: Secondary | ICD-10-CM

## 2015-03-06 DIAGNOSIS — E039 Hypothyroidism, unspecified: Secondary | ICD-10-CM | POA: Diagnosis present

## 2015-03-06 HISTORY — DX: Systemic inflammatory response syndrome (sirs) of non-infectious origin without acute organ dysfunction: R65.10

## 2015-03-06 HISTORY — DX: Personal history of colonic polyps: Z86.010

## 2015-03-06 HISTORY — DX: Personal history of colon polyps, unspecified: Z86.0100

## 2015-03-06 LAB — BASIC METABOLIC PANEL
Anion gap: 5 (ref 5–15)
BUN: 14 mg/dL (ref 6–20)
CALCIUM: 9 mg/dL (ref 8.9–10.3)
CO2: 31 mmol/L (ref 22–32)
CREATININE: 1.07 mg/dL (ref 0.61–1.24)
Chloride: 101 mmol/L (ref 101–111)
GFR calc Af Amer: >60 mL/min (ref >60)
GLUCOSE: 107 mg/dL — AB (ref 65–99)
POTASSIUM: 4.3 mmol/L (ref 3.5–5.1)
SODIUM: 137 mmol/L (ref 135–145)

## 2015-03-06 LAB — CBC WITH DIFFERENTIAL/PLATELET
Basophils Absolute: 0.1 10*3/uL (ref 0.0–0.1)
Basophils Relative: 1 %
EOS ABS: 0.1 10*3/uL (ref 0.0–0.7)
EOS PCT: 1 %
HCT: 45.9 % (ref 39.0–52.0)
Hemoglobin: 15.1 g/dL (ref 13.0–17.0)
LYMPHS ABS: 0.8 10*3/uL (ref 0.7–4.0)
LYMPHS PCT: 6 %
MCH: 26.5 pg (ref 26.0–34.0)
MCHC: 32.9 g/dL (ref 30.0–36.0)
MCV: 80.7 fL (ref 78.0–100.0)
MONO ABS: 1.3 10*3/uL — AB (ref 0.1–1.0)
MONOS PCT: 10 %
Neutro Abs: 10.7 10*3/uL — ABNORMAL HIGH (ref 1.7–7.7)
Neutrophils Relative %: 82 %
PLATELETS: 102 10*3/uL — AB (ref 150–400)
RBC: 5.69 MIL/uL (ref 4.22–5.81)
RDW: 17.7 % — AB (ref 11.5–15.5)
WBC: 13 10*3/uL — AB (ref 4.0–10.5)

## 2015-03-06 MED ORDER — VANCOMYCIN HCL 10 G IV SOLR
2500.0000 mg | Freq: Once | INTRAVENOUS | Status: AC
  Administered 2015-03-06: 2500 mg via INTRAVENOUS
  Filled 2015-03-06: qty 2500

## 2015-03-06 MED ORDER — PANTOPRAZOLE SODIUM 40 MG PO TBEC
40.0000 mg | DELAYED_RELEASE_TABLET | Freq: Two times a day (BID) | ORAL | Status: DC
  Administered 2015-03-06 – 2015-03-08 (×4): 40 mg via ORAL
  Filled 2015-03-06 (×4): qty 1

## 2015-03-06 MED ORDER — VANCOMYCIN HCL IN DEXTROSE 1-5 GM/200ML-% IV SOLN
1000.0000 mg | Freq: Two times a day (BID) | INTRAVENOUS | Status: DC
  Administered 2015-03-06 – 2015-03-07 (×3): 1000 mg via INTRAVENOUS
  Filled 2015-03-06 (×7): qty 200

## 2015-03-06 MED ORDER — OXYCODONE-ACETAMINOPHEN 5-325 MG PO TABS
1.0000 | ORAL_TABLET | Freq: Four times a day (QID) | ORAL | Status: DC | PRN
  Administered 2015-03-06 – 2015-03-07 (×2): 1 via ORAL
  Filled 2015-03-06 (×2): qty 1

## 2015-03-06 MED ORDER — ALBUTEROL SULFATE (2.5 MG/3ML) 0.083% IN NEBU: 2.5000 mg | INHALATION_SOLUTION | RESPIRATORY_TRACT | Status: DC | PRN

## 2015-03-06 MED ORDER — ACETAMINOPHEN 650 MG RE SUPP: 650.0000 mg | Freq: Four times a day (QID) | RECTAL | Status: DC | PRN

## 2015-03-06 MED ORDER — LATANOPROST 0.005 % OP SOLN
1.0000 [drp] | Freq: Every day | OPHTHALMIC | Status: DC
  Administered 2015-03-06 – 2015-03-07 (×2): 1 [drp] via OPHTHALMIC
  Filled 2015-03-06: qty 2.5

## 2015-03-06 MED ORDER — POLYVINYL ALCOHOL 1.4 % OP SOLN: 1.0000 [drp] | OPHTHALMIC | Status: DC | PRN

## 2015-03-06 MED ORDER — FERROUS SULFATE 325 (65 FE) MG PO TABS
ORAL_TABLET | Freq: Every day | ORAL | Status: DC
  Administered 2015-03-06 – 2015-03-08 (×3): 325 mg via ORAL
  Filled 2015-03-06 (×3): qty 1

## 2015-03-06 MED ORDER — METOPROLOL SUCCINATE ER 25 MG PO TB24
25.0000 mg | ORAL_TABLET | Freq: Every day | ORAL | Status: DC
Start: 2015-03-06 — End: 2015-03-08
  Administered 2015-03-06 – 2015-03-08 (×3): 25 mg via ORAL
  Filled 2015-03-06 (×3): qty 1

## 2015-03-06 MED ORDER — POTASSIUM CHLORIDE 20 MEQ PO PACK
20.0000 meq | PACK | Freq: Two times a day (BID) | ORAL | Status: DC
  Administered 2015-03-06 – 2015-03-08 (×4): 20 meq via ORAL
  Filled 2015-03-06 (×4): qty 1

## 2015-03-06 MED ORDER — TIMOLOL HEMIHYDRATE 0.5 % OP SOLN: 1.0000 [drp] | Freq: Two times a day (BID) | OPHTHALMIC | Status: DC

## 2015-03-06 MED ORDER — POLYETHYL GLYCOL-PROPYL GLYCOL 0.4-0.3 % OP SOLN: 1.0000 [drp] | Freq: Every day | OPHTHALMIC | Status: DC | PRN

## 2015-03-06 MED ORDER — OXYCODONE-ACETAMINOPHEN 10-325 MG PO TABS: 1.0000 | ORAL_TABLET | Freq: Four times a day (QID) | ORAL | Status: DC | PRN

## 2015-03-06 MED ORDER — IOHEXOL 300 MG/ML  SOLN
75.0000 mL | Freq: Once | INTRAMUSCULAR | Status: AC | PRN
  Administered 2015-03-06: 75 mL via INTRAVENOUS

## 2015-03-06 MED ORDER — OXYCODONE HCL 5 MG PO TABS
5.0000 mg | ORAL_TABLET | Freq: Four times a day (QID) | ORAL | Status: DC | PRN
  Administered 2015-03-06 – 2015-03-07 (×2): 5 mg via ORAL
  Filled 2015-03-06 (×2): qty 1

## 2015-03-06 MED ORDER — MORPHINE SULFATE (PF) 2 MG/ML IV SOLN: 2.0000 mg | INTRAVENOUS | Status: DC | PRN

## 2015-03-06 MED ORDER — ACETAMINOPHEN 325 MG PO TABS: 650.0000 mg | ORAL_TABLET | Freq: Four times a day (QID) | ORAL | Status: DC | PRN

## 2015-03-06 MED ORDER — FENTANYL CITRATE (PF) 100 MCG/2ML IJ SOLN
50.0000 ug | Freq: Once | INTRAMUSCULAR | Status: AC
  Administered 2015-03-06: 50 ug via INTRAVENOUS
  Filled 2015-03-06: qty 2

## 2015-03-06 MED ORDER — DOCUSATE SODIUM 100 MG PO CAPS
100.0000 mg | ORAL_CAPSULE | Freq: Every day | ORAL | Status: DC | PRN
  Administered 2015-03-08: 100 mg via ORAL
  Filled 2015-03-06: qty 1

## 2015-03-06 MED ORDER — FENTANYL CITRATE (PF) 100 MCG/2ML IJ SOLN
50.0000 ug | Freq: Once | INTRAMUSCULAR | Status: AC
Start: 2015-03-06 — End: 2015-03-06
  Administered 2015-03-06: 50 ug via INTRAVENOUS
  Filled 2015-03-06: qty 2

## 2015-03-06 MED ORDER — ONDANSETRON HCL 4 MG/2ML IJ SOLN: 4.0000 mg | Freq: Four times a day (QID) | INTRAMUSCULAR | Status: DC | PRN

## 2015-03-06 MED ORDER — ALUM & MAG HYDROXIDE-SIMETH 200-200-20 MG/5ML PO SUSP: 30.0000 mL | Freq: Four times a day (QID) | ORAL | Status: DC | PRN

## 2015-03-06 MED ORDER — TORSEMIDE 20 MG PO TABS
40.0000 mg | ORAL_TABLET | Freq: Two times a day (BID) | ORAL | Status: DC
  Administered 2015-03-06 – 2015-03-08 (×4): 40 mg via ORAL
  Filled 2015-03-06 (×4): qty 2

## 2015-03-06 MED ORDER — CYCLOBENZAPRINE HCL 10 MG PO TABS: 10.0000 mg | ORAL_TABLET | Freq: Three times a day (TID) | ORAL | Status: DC | PRN

## 2015-03-06 MED ORDER — IOHEXOL 300 MG/ML  SOLN: 75.0000 mL | Freq: Once | INTRAMUSCULAR | Status: DC | PRN

## 2015-03-06 MED ORDER — TIMOLOL MALEATE 0.5 % OP SOLN
1.0000 [drp] | Freq: Two times a day (BID) | OPHTHALMIC | Status: DC
  Administered 2015-03-06 – 2015-03-08 (×4): 1 [drp] via OPHTHALMIC
  Filled 2015-03-06: qty 5

## 2015-03-06 MED ORDER — ONDANSETRON HCL 4 MG PO TABS: 4.0000 mg | ORAL_TABLET | Freq: Four times a day (QID) | ORAL | Status: DC | PRN

## 2015-03-06 NOTE — Progress Notes (Signed)
ANTIBIOTIC CONSULT NOTE - INITIAL  Pharmacy Consult for vancomycin Indication: parotitis  Allergies  Allergen Reactions  . Aspirin Other (See Comments)    Nausea and upset stomach    . Nexium [Esomeprazole Magnesium] Other (See Comments)    Patient said it messed his stomach up    Patient Measurements: Height: 5\' 9"  (175.3 cm) Weight: 278 lb (126.1 kg) IBW/kg (Calculated) : 70.7   Vital Signs: Temp: 98.1 F (36.7 C) (10/14 1033) Temp Source: Oral (10/14 1033) BP: 127/64 mmHg (10/14 1033) Pulse Rate: 81 (10/14 1033) Intake/Output from previous day:   Intake/Output from this shift:    Labs:  Recent Labs  03/06/15 0754  WBC 13.0*  HGB 15.1  PLT 102*  CREATININE 1.07   Estimated Creatinine Clearance: 78.4 mL/min (by C-G formula based on Cr of 1.07). No results for input(s): VANCOTROUGH, VANCOPEAK, VANCORANDOM, GENTTROUGH, GENTPEAK, GENTRANDOM, TOBRATROUGH, TOBRAPEAK, TOBRARND, AMIKACINPEAK, AMIKACINTROU, AMIKACIN in the last 72 hours.   Microbiology: No results found for this or any previous visit (from the past 720 hour(s)).  Medical History: Past Medical History  Diagnosis Date  . CHF (congestive heart failure) ([place])   . Obesity   . Hyperlipidemia   . PUD (peptic ulcer disease)   . GERD (gastroesophageal reflux disease)   . S/P endoscopy 2007, 2012    2007: nl, May 2012: antral erosions  . Osteoarthritis   . S/P colonoscopy 2007, Feb and March 2012    hx of adenomas, left-sided diverticula, On 07/2010 TCS, fresh blood and clot noted coming from TI.  Marland Kitchen Anxiety   . Hypothyroidism     hypothyroid  . Ringing in ears   . History of kidney stones   . History of bladder infections   . Heel spur     right heel  . Collagen vascular disease ([place])     history of venous insufficency and LE cellulitis  . Thrombocytopenia ([place]) 12/30/2011    Stable  . BPH (benign prostatic hyperplasia)   . Hypertension     Sees Dr. Debara Pickett  . Bilateral lower leg cellulitis   .  Sleep apnea     uses oxygen 2L via [place] cannot tolerate CPAP - sleep study 2008 AHI 48.80/hr and 56.70hr during REM  . Complication of anesthesia     due to sleep apnea pt has had difficulty being put under anesthesia  . PONV (postoperative nausea and vomiting)   . Deep venous insufficiency   . Chronic diastolic HF (heart failure) ([facility])     Medications:  See medication history Assessment: 74 yo man to start vancomycin for parotitis.  His CrCl ~78 ml/min.    Goal of Therapy:  Vancomycin trough level 10-15 mcg/ml  Plan:  Vancomycin 2500 mg IV X 1 then 1 gm IV q12 hours as per obesity nomogram F/u renal function, cultures and clinical course  Thanks for allowing pharmacy to be a part of this patient's care.  Excell Seltzer, PharmD Clinical Pharmacist 03/06/2015,11:09 AM

## 2015-03-06 NOTE — ED Notes (Signed)
Pt's family member, Rollene Fare left number to be contacted if needed. [phone]

## 2015-03-06 NOTE — ED Notes (Signed)
Notified son Evette Doffing [phone] that father would be admitted to rm [phone]

## 2015-03-06 NOTE — ED Provider Notes (Signed)
CSN: [id number]     Arrival date & time 03/06/15  0708 History  By signing my name below, I, Terressa Koyanagi, attest that this documentation has been prepared under the direction and in the presence of Nat Christen, MD. Electronically Signed: Terressa Koyanagi, ED Scribe. 03/06/2015. 8:23 AM.  Chief Complaint  Patient presents with  . Oral Swelling    lump to left neck   The history is provided by the patient. No language interpreter was used.   HPI Comments: [name] [name] is a 74 y.o. male, with PMHx noted below, who presents to the Emergency Department complaining of atraumatic, sudden onset, left neck mass with associated fullness sensation onset 24 hours ago. Pt denies trouble swallowing, fever, chills, or any other Sx at this time. Pt further denies any hx of similar Sx. No meningeal signs. S/p pna xaccination left upper arm earlier this week.  Past Medical History  Diagnosis Date  . CHF (congestive heart failure) ([place])   . Obesity   . Hyperlipidemia   . PUD (peptic ulcer disease)   . GERD (gastroesophageal reflux disease)   . S/P endoscopy 2007, 2012    2007: nl, May 2012: antral erosions  . Osteoarthritis   . S/P colonoscopy 2007, Feb and March 2012    hx of adenomas, left-sided diverticula, On 07/2010 TCS, fresh blood and clot noted coming from TI.  Marland Kitchen Anxiety   . Hypothyroidism     hypothyroid  . Ringing in ears   . History of kidney stones   . History of bladder infections   . Heel spur     right heel  . Collagen vascular disease ([place])     history of venous insufficency and LE cellulitis  . Thrombocytopenia ([place]) 12/30/2011    Stable  . BPH (benign prostatic hyperplasia)   . Hypertension     Sees Dr. Debara Pickett  . Bilateral lower leg cellulitis   . Sleep apnea     uses oxygen 2L via [place] cannot tolerate CPAP - sleep study 2008 AHI 48.80/hr and 56.70hr during REM  . Complication of anesthesia     due to sleep apnea pt has had difficulty being put under anesthesia  . PONV  (postoperative nausea and vomiting)   . Deep venous insufficiency   . Chronic diastolic HF (heart failure) [facility])    Past Surgical History  Procedure Laterality Date  . Knee arthroscopy    . Rotator cuff repair    . Cholecystectomy    . Lumbar spine surgery    . Small bowel capsule study  07/2011    ?transient focal ischemia in distal small bowel to explain GI bleeding  . Esophagogastroduodenoscopy  Aug 2013    [facility], single 85mm pedunculated polyp otherwise normal. HYPERPLASTIC, NEGATIVE h.pylori  . Cardiac catheterization  02/27/2007    no sign of CAD, LVH, mod pulm HTN (Dr. Jackie Plum(  . Transurethral resection of prostate    . Transurethral resection of prostate N/A 06/18/2013    Procedure: TRANSURETHRAL RESECTION OF THE PROSTATE (TURP);  Surgeon: Marissa Nestle, MD;  Location: [facility] ORS;  Service: Urology;  Laterality: N/A;  . Transthoracic echocardiogram  09/2008    EF 60-65%, mod conc LVH  . Nm myocar perf wall motion  12/2006    dipyridamole myoview - stress images show mild perfusion defect in mid inferior walls with reversibility at rest; mild perfusion defect in mid inferolateral wall at stress with mild defect reversibility, EF 62%, abnormal but low risk study   .  Esophagogastroduodenoscopy N/A 05/28/2014    RMR:non-critical Schatzki's ring/HH. Benign gastritis  . Savory dilation N/A 05/28/2014    Procedure: SAVORY DILATION;  Surgeon: Daneil Dolin, MD;  Location: [facility];  Service: Endoscopy;  Laterality: N/A;  Venia Minks dilation N/A 05/28/2014    Procedure: Venia Minks DILATION;  Surgeon: Daneil Dolin, MD;  Location: [facility];  Service: Endoscopy;  Laterality: N/A;  . Colonoscopy N/A 05/28/2014    DDU:KGURKYHC colonic polyps/colonic diverticulosis. Tubular adenomas. Next colonoscopy January 2021   Family History  Problem Relation Age of Onset  . Colon cancer Father 56    deceased  . Prostate cancer Father   . Pancreatic cancer Mother 41    deceased  . Prostate cancer  Brother   . Arthritis Son   . Arthritis Son   . Diabetes Mellitus II Son    Social History  Substance Use Topics  . Smoking status: Former Smoker -- 8 years    Types: Cigars    Start date: 11/20/1959    Quit date: 08/10/1973  . Smokeless tobacco: Never Used  . Alcohol Use: No    Review of Systems  Constitutional: Negative for fever and chills.  HENT: Negative for trouble swallowing.        Left neck mass with associated fullness sensation  All other systems reviewed and are negative.   A complete 10 system review of systems was obtained and all systems are negative except as noted in the HPI and PMH.    Allergies  Aspirin and Nexium  Home Medications   Prior to Admission medications   Medication Sig Start Date End Date Taking? Authorizing Provider  acetaminophen (TYLENOL) 325 MG tablet Take 325-650 mg by mouth every 6 (six) hours as needed for pain.    Yes Historical Provider, MD  cyclobenzaprine (FLEXERIL) 10 MG tablet Take 1 tablet (10 mg total) by mouth 3 (three) times daily as needed for muscle spasms. 08/31/12  Yes Ashok Pall, MD  docusate sodium (COLACE) 100 MG capsule Take 100 mg by mouth daily as needed for mild constipation.    Yes Historical Provider, MD  IRON PO Take 1 tablet by mouth daily.   Yes Historical Provider, MD  latanoprost (XALATAN) 0.005 % ophthalmic solution Place 1 drop into both eyes at bedtime.   Yes Historical Provider, MD  metoprolol succinate (TOPROL-XL) 25 MG 24 hr tablet Take 25 mg by mouth daily.   Yes Historical Provider, MD  oxyCODONE-acetaminophen (PERCOCET) 10-325 MG per tablet Take 1 tablet by mouth every 6 (six) hours as needed for pain. 08/24/13  Yes Theodis Blaze, MD  pantoprazole (PROTONIX) 40 MG tablet Take 40 mg by mouth 2 (two) times daily.   Yes Historical Provider, MD  Polyethyl Glycol-Propyl Glycol (LUBRICANT EYE DROPS) 0.4-0.3 % SOLN Apply 1 drop to eye daily as needed (for lubricant eye drops).   Yes Historical Provider, MD   potassium chloride (KLOR-CON) 20 MEQ packet Take 20 mEq by mouth 2 (two) times daily. 11/14/13  Yes Arnoldo Lenis, MD  promethazine (PHENERGAN) 12.5 MG tablet Take 12.5 mg by mouth every 6 (six) hours as needed for nausea or vomiting.   Yes Historical Provider, MD  timolol (BETIMOL) 0.5 % ophthalmic solution Place 1 drop into the left eye 2 (two) times daily.   Yes Historical Provider, MD  torsemide (DEMADEX) 20 MG tablet Take 40 mg by mouth 2 (two) times daily.   Yes Historical Provider, MD   Triage Vitals: BP 138/84  mmHg  Pulse 82  Temp(Src) 98 F (36.7 C) (Oral)  Resp 18  Ht 5\' 9"  (1.753 m)  Wt 278 lb (126.1 kg)  BMI 41.03 kg/m2  SpO2 96% Physical Exam  Constitutional: He is oriented to person, place, and time. He appears well-developed and well-nourished.  HENT:  Head: Normocephalic and atraumatic.  Mouth/Throat: Uvula is midline, oropharynx is clear and moist and mucous membranes are normal.  Eyes: Conjunctivae and EOM are normal. Pupils are equal, round, and reactive to light.  Neck: Normal range of motion. Neck supple.  Left lateral neck: 2.5cm indurated mass.   Cardiovascular: Normal rate and regular rhythm.   Pulmonary/Chest: Effort normal and breath sounds normal.  Abdominal: Soft. Bowel sounds are normal.  Musculoskeletal: Normal range of motion.  Neurological: He is alert and oriented to person, place, and time.  Skin:  Left upper arm: Area of erythema approximately 4 cm in diameter on lateral proximal aspect  Psychiatric: He has a normal mood and affect. His behavior is normal.  Nursing note and vitals reviewed.   ED Course  Procedures (including critical care time) DIAGNOSTIC STUDIES: Oxygen Saturation is 96% on RA, nl by my interpretation.    COORDINATION OF CARE: 8:18 AM: Discussed treatment plan which includes CT of neck and labs with pt at bedside; patient verbalizes understanding and agrees with treatment plan. 11:00 AM: Recheck, discussed intravenous  antibiotics and possible admittance to hospital.   Labs Review Labs Reviewed  CBC WITH DIFFERENTIAL/PLATELET - Abnormal; Notable for the following:    WBC 13.0 (*)    RDW 17.7 (*)    Platelets 102 (*)    Neutro Abs 10.7 (*)    Monocytes Absolute 1.3 (*)    All other components within normal limits  BASIC METABOLIC PANEL - Abnormal; Notable for the following:    Glucose, Bld 107 (*)    All other components within normal limits    Imaging Review Ct Soft Tissue Neck W Contrast  03/06/2015  CLINICAL DATA:  Sudden onset of left ear pain and left side neck swelling this morning. Left parotid mass. EXAM: CT NECK WITH CONTRAST TECHNIQUE: Multidetector CT imaging of the neck was performed using the standard protocol following the bolus administration of intravenous contrast. CONTRAST:  25mL OMNIPAQUE IOHEXOL 300 MG/ML  SOLN COMPARISON:  None. FINDINGS: Pharynx and larynx: Unremarkable.  Airway patent. Salivary glands: There is stranding within the inferior aspect of the left parotid gland with overlying soft tissue stranding. Findings likely reflect parotitis. Remainder of the salivary glands are unremarkable. Thyroid: Unremarkable. Lymph nodes: No cervical adenopathy. Vascular: Vessels widely patent. Limited intracranial: Unremarkable. Visualized orbits: Unremarkable. Mastoids and visualized paranasal sinuses: Clear. Skeleton: No acute bony abnormality. Degenerative disc disease throughout the cervical spine. Upper chest: Lung apices clear. Other: None IMPRESSION: Stranding within the inferior aspect of the left parotid gland and overlying soft tissues likely reflects parotitis. Electronically Signed   By: Rolm Baptise M.D.   On: 03/06/2015 09:23   I have personally reviewed and evaluated these images and lab results as part of my medical decision-making.  MDM   Final diagnoses:  Parotitis    Airway is not compromised. CT scan reveals parotitis. IV vancomycin. Admit to general medicine.  I,  Gunnard Dorrance, personally performed the services described in this documentation. All medical record entries made by the scribe were at my direction and in my presence.  I have reviewed the chart and discharge instructions and agree that the record reflects my personal performance  and is accurate and complete. [place] Sunday.  03/06/2015. 12:57 PM.     Nat Christen, MD 03/06/15 1257

## 2015-03-06 NOTE — ED Notes (Signed)
Notice lump to left throat this am.  C/o pain to left ear, rates 7/10.  Pt says he has not eat of drank anything this am.

## 2015-03-06 NOTE — H&P (Signed)
[facility] History and Physical  Rhyland Hinderliter DXA:[id number] DOB: 06/02/1940 DOA: 03/06/2015  Referring physician: ED physician, Dr. Lacinda Axon PCP: [name]., MD   Chief Complaint: Left face pain and swelling.   HPI: [name] [name] is a 74 year old man with a history of diastolic heart failure, chronic thrombocytopenia, and obstructive sleep apnea-intolerant to CPAP, who presents to the emergency department with a complaint of left facial pain and swelling. He was in his usual state of health until approximately at 5:00 AM this morning, when there was some soreness in his neck. He then felt his neck and there was a hard knot on the left side of his face around the mandible area. It was not present yesterday per his account. He denies trauma. He denies any recent dental surgery. He denies any recent cavities or tooth infections. He did have his teeth cleaned approximately one month ago. He also received his pneumonia vaccine 2 days ago on his left arm-there is still some redness at the injection site.Marland Kitchen He denies subjective fever, chills, difficulty swallowing, shortness of breath, nausea, vomiting, diarrhea, ear pain, sore throat, diarrhea, pain with urination or swelling of his lips or tongue. He does have chronic swelling in both his legs.  In the ED, he was afebrile and hemodynamic stable. His lab data were significant for the PVC of 13.0 and platelet count of 102. CT of his neck revealed stranding within the inferior aspect of the left parotid gland and overlying soft tissues likely reflects parotitis. He is being admitted for observation and IV antibiotic treatment.    Review of Systems:  As above in history present illness. In addition, he has chronic swelling in both of his legs; he has arthritic pain in his legs and lower back; otherwise review systems is negative.  Past Medical History  Diagnosis Date  . CHF (congestive heart failure) ([place])   . Obesity   . Hyperlipidemia    . PUD (peptic ulcer disease)   . GERD (gastroesophageal reflux disease)   . S/P endoscopy 2007, 2012    2007: nl, May 2012: antral erosions  . Osteoarthritis   . S/P colonoscopy 2007, Feb and March 2012    hx of adenomas, left-sided diverticula, On 07/2010 TCS, fresh blood and clot noted coming from TI.  Marland Kitchen Anxiety   . Hypothyroidism     hypothyroid  . Ringing in ears   . History of kidney stones   . History of bladder infections   . Heel spur     right heel  . Collagen vascular disease ([place])     history of venous insufficency and LE cellulitis  . Thrombocytopenia ([place]) 12/30/2011    Stable  . BPH (benign prostatic hyperplasia)   . Hypertension     Sees Dr. Debara Pickett  . Bilateral lower leg cellulitis   . Sleep apnea     uses oxygen 2L via  cannot tolerate CPAP - sleep study 2008 AHI 48.80/hr and 56.70hr during REM  . Complication of anesthesia     due to sleep apnea pt has had difficulty being put under anesthesia  . PONV (postoperative nausea and vomiting)   . Deep venous insufficiency   . Chronic diastolic HF (heart failure) ([place])   . SIRS (systemic inflammatory response syndrome) ([place]) 02/27/2012  . History of colonic polyps    Past Surgical History  Procedure Laterality Date  . Knee arthroscopy    . Rotator cuff repair    . Cholecystectomy    . Lumbar spine  surgery    . Small bowel capsule study  07/2011    ?transient focal ischemia in distal small bowel to explain GI bleeding  . Esophagogastroduodenoscopy  Aug 2013    [facility], single 6mm pedunculated polyp otherwise normal. HYPERPLASTIC, NEGATIVE h.pylori  . Cardiac catheterization  02/27/2007    no sign of CAD, LVH, mod pulm HTN (Dr. Jackie Plum(  . Transurethral resection of prostate    . Transurethral resection of prostate N/A 06/18/2013    Procedure: TRANSURETHRAL RESECTION OF THE PROSTATE (TURP);  Surgeon: Marissa Nestle, MD;  Location: [facility];  Service: Urology;  Laterality: N/A;  . Transthoracic echocardiogram   09/2008    EF 60-65%, mod conc LVH  . Nm myocar perf wall motion  12/2006    dipyridamole myoview - stress images show mild perfusion defect in mid inferior walls with reversibility at rest; mild perfusion defect in mid inferolateral wall at stress with mild defect reversibility, EF 62%, abnormal but low risk study   . Esophagogastroduodenoscopy N/A 05/28/2014    RMR:non-critical Schatzki's ring/HH. Benign gastritis  . Savory dilation N/A 05/28/2014    Procedure: SAVORY DILATION;  Surgeon: Daneil Dolin, MD;  Location: [facility];  Service: Endoscopy;  Laterality: N/A;  Venia Minks dilation N/A 05/28/2014    Procedure: Venia Minks DILATION;  Surgeon: Daneil Dolin, MD;  Location: [facility];  Service: Endoscopy;  Laterality: N/A;  . Colonoscopy N/A 05/28/2014    DDU:KGURKYHC colonic polyps/colonic diverticulosis. Tubular adenomas. Next colonoscopy January 2021   Social History: Patient is widowed. He lives in [place]. He has 5 children. He drives occasionally. He  reports that he quit smoking about 41 years ago. His smoking use included Cigars. He started smoking about 55 years ago. He has never used smokeless tobacco. He reports that he does not drink alcohol or use illicit drugs.  Allergies  Allergen Reactions  . Aspirin Other (See Comments)    Nausea and upset stomach    . Nexium [Esomeprazole Magnesium] Other (See Comments)    Patient said it messed his stomach up    Family History  Problem Relation Age of Onset  . Colon cancer Father 59    deceased  . Prostate cancer Father   . Pancreatic cancer Mother 27    deceased  . Prostate cancer Brother   . Arthritis Son   . Arthritis Son   . Diabetes Mellitus II Son      Prior to Admission medications   Medication Sig Start Date End Date Taking? Authorizing Provider  acetaminophen (TYLENOL) 325 MG tablet Take 325-650 mg by mouth every 6 (six) hours as needed for pain.    Yes Historical Provider, MD  cyclobenzaprine (FLEXERIL) 10 MG tablet  Take 1 tablet (10 mg total) by mouth 3 (three) times daily as needed for muscle spasms. 08/31/12  Yes Ashok Pall, MD  docusate sodium (COLACE) 100 MG capsule Take 100 mg by mouth daily as needed for mild constipation.    Yes Historical Provider, MD  IRON PO Take 1 tablet by mouth daily.   Yes Historical Provider, MD  latanoprost (XALATAN) 0.005 % ophthalmic solution Place 1 drop into both eyes at bedtime.   Yes Historical Provider, MD  metoprolol succinate (TOPROL-XL) 25 MG 24 hr tablet Take 25 mg by mouth daily.   Yes Historical Provider, MD  oxyCODONE-acetaminophen (PERCOCET) 10-325 MG per tablet Take 1 tablet by mouth every 6 (six) hours as needed for pain. 08/24/13  Yes Theodis Blaze,  MD  pantoprazole (PROTONIX) 40 MG tablet Take 40 mg by mouth 2 (two) times daily.   Yes Historical Provider, MD  Polyethyl Glycol-Propyl Glycol (LUBRICANT EYE DROPS) 0.4-0.3 % SOLN Apply 1 drop to eye daily as needed (for lubricant eye drops).   Yes Historical Provider, MD  potassium chloride (KLOR-CON) 20 MEQ packet Take 20 mEq by mouth 2 (two) times daily. 11/14/13  Yes Arnoldo Lenis, MD  promethazine (PHENERGAN) 12.5 MG tablet Take 12.5 mg by mouth every 6 (six) hours as needed for nausea or vomiting.   Yes Historical Provider, MD  timolol (BETIMOL) 0.5 % ophthalmic solution Place 1 drop into the left eye 2 (two) times daily.   Yes Historical Provider, MD  torsemide (DEMADEX) 20 MG tablet Take 40 mg by mouth 2 (two) times daily.   Yes Historical Provider, MD   Physical Exam: Filed Vitals:   03/06/15 1237 03/06/15 1300 03/06/15 1326 03/06/15 1352  BP: 162/83 147/81 147/83 133/68  Pulse: 84 79 82 78  Temp:   98.2 F (36.8 C) 97.6 F (36.4 C)  TempSrc:    Oral  Resp:   24 22  Height:      Weight:      SpO2: 98% 94% 93% 97%    Wt Readings from Last 3 Encounters:  03/06/15 126.1 kg (278 lb)  03/02/15 124.739 kg (275 lb)  12/08/14 132.541 kg (292 lb 3.2 oz)    General:  Appears calm and comfortable;  pleasant obese 74 year old African-American man in no acute distress. Face: Moderately-sized indurated mass over the left mandible area/parotid gland; moderately tender; mild facial erythema. Eyes: PERRL, normal lids, irises & conjunctiva; conjunctivae are clear and sclerae are white. ENT: grossly normal hearing; tympanic membranes bilaterally with good light reflex, no edema or erythema. No posterior exudates or erythema. No lip or tongue edema. Mild inflammation/edema of the left nuchal mucosa; no obvious caries/cavities. Neck: Supple, mild shotty adenopathy left cervical area. Cardiovascular: S1, S2, with a soft systolic murmur. 1+ bilateral LE edema, with chronic venous stasis changes. Telemetry: Not applicable.  Respiratory: CTA bilaterally, no w/r/r. Normal respiratory effort. Abdomen: soft, positive bowel sounds, soft, nontender, nondistended. Skin: Chronic venous stasis changes of both legs; mild left facial erythema as above. He has a 4-1/2-5 cm circular area of erythema over his left deltoid from the previous pneumonia vaccine; nontender. Musculoskeletal: grossly normal tone BUE/BLE; no acute hot red joints. Psychiatric: grossly normal mood and affect, speech fluent and appropriate Neurologic: grossly non-focal; cranial nerves II through XII are intact.           Labs on Admission:  Basic Metabolic Panel:  Recent Labs Lab 03/06/15 0754  NA 137  K 4.3  CL 101  CO2 31  GLUCOSE 107*  BUN 14  CREATININE 1.07  CALCIUM 9.0   Liver Function Tests: No results for input(s): AST, ALT, ALKPHOS, BILITOT, PROT, ALBUMIN in the last 168 hours. No results for input(s): LIPASE, AMYLASE in the last 168 hours. No results for input(s): AMMONIA in the last 168 hours. CBC:  Recent Labs Lab 03/06/15 0754  WBC 13.0*  NEUTROABS 10.7*  HGB 15.1  HCT 45.9  MCV 80.7  PLT 102*   Cardiac Enzymes: No results for input(s): CKTOTAL, CKMB, CKMBINDEX, TROPONINI in the last 168 hours.  BNP  (last 3 results) No results for input(s): BNP in the last 8760 hours.  ProBNP (last 3 results) No results for input(s): PROBNP in the last 8760 hours.  CBG: No results  for input(s): GLUCAP in the last 168 hours.  Radiological Exams on Admission: Ct Soft Tissue Neck W Contrast  03/06/2015  CLINICAL DATA:  Sudden onset of left ear pain and left side neck swelling this morning. Left parotid mass. EXAM: CT NECK WITH CONTRAST TECHNIQUE: Multidetector CT imaging of the neck was performed using the standard protocol following the bolus administration of intravenous contrast. CONTRAST:  26mL OMNIPAQUE IOHEXOL 300 MG/ML  SOLN COMPARISON:  None. FINDINGS: Pharynx and larynx: Unremarkable.  Airway patent. Salivary glands: There is stranding within the inferior aspect of the left parotid gland with overlying soft tissue stranding. Findings likely reflect parotitis. Remainder of the salivary glands are unremarkable. Thyroid: Unremarkable. Lymph nodes: No cervical adenopathy. Vascular: Vessels widely patent. Limited intracranial: Unremarkable. Visualized orbits: Unremarkable. Mastoids and visualized paranasal sinuses: Clear. Skeleton: No acute bony abnormality. Degenerative disc disease throughout the cervical spine. Upper chest: Lung apices clear. Other: None IMPRESSION: Stranding within the inferior aspect of the left parotid gland and overlying soft tissues likely reflects parotitis. Electronically Signed   By: Rolm Baptise M.D.   On: 03/06/2015 09:23    EKG: Independently reviewed. Not applicable  Assessment/Plan Active Problems:   Parotitis, acute   Edema of both legs   Chronic diastolic CHF (congestive heart failure) (H[facility])   Essential hypertension   GASTROESOPHAGEAL REFLUX DISEASE, CHRONIC   Thrombocytopenia ([facility])   Morbid obesity- BMI 44.5   Injection site irritation   1. Acute left parotitis. Not sure what precipitated this. Patient was wondering if the pneumonia vaccine given on his left arm  a couple days ago and was associated with the inflamed parotid gland. I informed him that I did not think so, but it was a good thought. Patient does have a mild leukocytosis, but he is afebrile and does not appear toxic. We'll continue IV vancomycin started. We'll treat his pain with as needed opiates. We'll add warm compress to the left parotid gland area prn. Considered changing antibiotic therapy at the time of discharge to either clindamycin or Augmentin. 2. Injection site irritation. The patient received his Pneumovax a couple days ago in his PCPs office. There is some erythema around the injection site, but no evidence of purulence or drainage. Continue to monitor. 3. Chronic thrombocytopenia and mild chronic leukocytosis. The patient is followed by hematology. He has undergone workup for proliferative disease, but I do not see the results in the chart. His thrombocytopenia and leukocytosis appeared to be mild and at baseline. 4. Essential hypertension. Currently stable. We'll continue home dose of Toprol-XL. 5. Chronic diastolic heart failure/chronic lower extremity edema. Currently stable. We'll continue home dose of torsemide.    Code Status: Full code DVT Prophylaxis: SCDs Family Communication: Discussed with son Disposition Plan: Anticipate discharge in the next 24 hours.  Time spent: 1 hour  [facility] Pager [phone]

## 2015-03-07 DIAGNOSIS — Z8042 Family history of malignant neoplasm of prostate: Secondary | ICD-10-CM | POA: Diagnosis not present

## 2015-03-07 DIAGNOSIS — T8089XD Other complications following infusion, transfusion and therapeutic injection, subsequent encounter: Secondary | ICD-10-CM | POA: Diagnosis not present

## 2015-03-07 DIAGNOSIS — E785 Hyperlipidemia, unspecified: Secondary | ICD-10-CM | POA: Diagnosis present

## 2015-03-07 DIAGNOSIS — I5032 Chronic diastolic (congestive) heart failure: Secondary | ICD-10-CM | POA: Diagnosis not present

## 2015-03-07 DIAGNOSIS — K219 Gastro-esophageal reflux disease without esophagitis: Secondary | ICD-10-CM | POA: Diagnosis present

## 2015-03-07 DIAGNOSIS — Z833 Family history of diabetes mellitus: Secondary | ICD-10-CM | POA: Diagnosis not present

## 2015-03-07 DIAGNOSIS — I10 Essential (primary) hypertension: Secondary | ICD-10-CM | POA: Diagnosis not present

## 2015-03-07 DIAGNOSIS — E039 Hypothyroidism, unspecified: Secondary | ICD-10-CM | POA: Diagnosis present

## 2015-03-07 DIAGNOSIS — D696 Thrombocytopenia, unspecified: Secondary | ICD-10-CM

## 2015-03-07 DIAGNOSIS — K1121 Acute sialoadenitis: Principal | ICD-10-CM

## 2015-03-07 DIAGNOSIS — Z87891 Personal history of nicotine dependence: Secondary | ICD-10-CM | POA: Diagnosis not present

## 2015-03-07 DIAGNOSIS — R51 Headache: Secondary | ICD-10-CM | POA: Diagnosis not present

## 2015-03-07 DIAGNOSIS — I110 Hypertensive heart disease with heart failure: Secondary | ICD-10-CM | POA: Diagnosis present

## 2015-03-07 DIAGNOSIS — L03114 Cellulitis of left upper limb: Secondary | ICD-10-CM | POA: Diagnosis not present

## 2015-03-07 DIAGNOSIS — Y848 Other medical procedures as the cause of abnormal reaction of the patient, or of later complication, without mention of misadventure at the time of the procedure: Secondary | ICD-10-CM | POA: Diagnosis present

## 2015-03-07 DIAGNOSIS — Z6841 Body Mass Index (BMI) 40.0 and over, adult: Secondary | ICD-10-CM | POA: Diagnosis not present

## 2015-03-07 DIAGNOSIS — M1990 Unspecified osteoarthritis, unspecified site: Secondary | ICD-10-CM | POA: Diagnosis present

## 2015-03-07 DIAGNOSIS — Z800 Family history of malignant neoplasm of digestive organs: Secondary | ICD-10-CM | POA: Diagnosis not present

## 2015-03-07 LAB — BASIC METABOLIC PANEL
ANION GAP: 9 (ref 5–15)
BUN: 16 mg/dL (ref 6–20)
CHLORIDE: 93 mmol/L — AB (ref 101–111)
CO2: 33 mmol/L — ABNORMAL HIGH (ref 22–32)
Calcium: 9 mg/dL (ref 8.9–10.3)
Creatinine, Ser: 1.15 mg/dL (ref 0.61–1.24)
GFR calc Af Amer: >60 mL/min (ref >60)
GLUCOSE: 116 mg/dL — AB (ref 65–99)
POTASSIUM: 3.5 mmol/L (ref 3.5–5.1)
Sodium: 135 mmol/L (ref 135–145)

## 2015-03-07 LAB — CBC
HEMATOCRIT: 49.6 % (ref 39.0–52.0)
HEMOGLOBIN: 16.4 g/dL (ref 13.0–17.0)
MCH: 26.4 pg (ref 26.0–34.0)
MCHC: 33.1 g/dL (ref 30.0–36.0)
MCV: 79.9 fL (ref 78.0–100.0)
Platelets: 139 10*3/uL — ABNORMAL LOW (ref 150–400)
RBC: 6.21 MIL/uL — ABNORMAL HIGH (ref 4.22–5.81)
RDW: 17.8 % — AB (ref 11.5–15.5)
WBC: 18.8 10*3/uL — AB (ref 4.0–10.5)

## 2015-03-07 MED ORDER — KETOROLAC TROMETHAMINE 30 MG/ML IJ SOLN
30.0000 mg | Freq: Two times a day (BID) | INTRAMUSCULAR | Status: DC
Start: 2015-03-07 — End: 2015-03-08
  Administered 2015-03-07 – 2015-03-08 (×2): 30 mg via INTRAVENOUS
  Filled 2015-03-07 (×2): qty 1

## 2015-03-07 MED ORDER — DEXTROSE 5 % IV SOLN
1.0000 g | INTRAVENOUS | Status: DC
  Administered 2015-03-07 – 2015-03-08 (×2): 1 g via INTRAVENOUS
  Filled 2015-03-07 (×3): qty 10

## 2015-03-07 NOTE — Progress Notes (Signed)
[facility] PROGRESS NOTE  [name] [name] RAQ:[id number] DOB: [birth year] DOA: 03/06/2015 PCP: [name]., MD  Assessment/Plan: 1. Acute left parotitis. Possibly infectious. Started on intravenous antibiotics with vancomycin. We'll add Rocephin today. He continues to have swelling, tenderness. WBC count is rising today. We'll continue to monitor. 2. Injection site irritation. Status post Pneumovax vaccination at his primary care office a few days prior to admission. There is erythema around the injection site. No evidence of purulence or drainage. 3. Chronic thrombocytopenia and mild chronic leukocytosis. Followed by hematology. 4. Hypertension. Appears stable. Continue Toprol. 5. Chronic diastolic congestive heart failure. Continue home dose of torsemide. Currently appears compensated.  Code Status: full code Family Communication: discussed with patient Disposition Plan: discharge home once improved   Consultants:    Procedures:    Antibiotics:  Vancomycin 10/14>>  Rocephin 10/15>>  HPI/Subjective: Feels that pain in left parotid area is worse today. Has frontal headache. No other complaints  Objective: Filed Vitals:   03/07/15 0525  BP: 132/63  Pulse: 81  Temp: 98.1 F (36.7 C)  Resp: 18    Intake/Output Summary (Last 24 hours) at 03/07/15 1000 Last data filed at 03/07/15 0923  Gross per 24 hour  Intake    920 ml  Output   5050 ml  Net  -4130 ml   Filed Weights   03/06/15 0721 03/06/15 1720  Weight: 126.1 kg (278 lb) 125.703 kg (277 lb 2 oz)    Exam:   General:  NAD  HEENT: tenderness and swelling noted over left parotid  Cardiovascular: s1, s2, rrr  Respiratory: cta b  Abdomen: soft, nt, nd, bs+  Musculoskeletal: no edema b/l   Data Reviewed: Basic Metabolic Panel:  Recent Labs Lab 03/06/15 0754 03/07/15 0556  NA 137 135  K 4.3 3.5  CL 101 93*  CO2 31 33*  GLUCOSE 107* 116*  BUN 14 16  CREATININE 1.07 1.15  CALCIUM  9.0 9.0   Liver Function Tests: No results for input(s): AST, ALT, ALKPHOS, BILITOT, PROT, ALBUMIN in the last 168 hours. No results for input(s): LIPASE, AMYLASE in the last 168 hours. No results for input(s): AMMONIA in the last 168 hours. CBC:  Recent Labs Lab 03/06/15 0754 03/07/15 0556  WBC 13.0* 18.8*  NEUTROABS 10.7*  --   HGB 15.1 16.4  HCT 45.9 49.6  MCV 80.7 79.9  PLT 102* 139*   Cardiac Enzymes: No results for input(s): CKTOTAL, CKMB, CKMBINDEX, TROPONINI in the last 168 hours. BNP (last 3 results) No results for input(s): BNP in the last 8760 hours.  ProBNP (last 3 results) No results for input(s): PROBNP in the last 8760 hours.  CBG: No results for input(s): GLUCAP in the last 168 hours.  No results found for this or any previous visit (from the past 240 hour(s)).   Studies: Ct Soft Tissue Neck W Contrast  03/06/2015  CLINICAL DATA:  Sudden onset of left ear pain and left side neck swelling this morning. Left parotid mass. EXAM: CT NECK WITH CONTRAST TECHNIQUE: Multidetector CT imaging of the neck was performed using the standard protocol following the bolus administration of intravenous contrast. CONTRAST:  27mL OMNIPAQUE IOHEXOL 300 MG/ML  SOLN COMPARISON:  None. FINDINGS: Pharynx and larynx: Unremarkable.  Airway patent. Salivary glands: There is stranding within the inferior aspect of the left parotid gland with overlying soft tissue stranding. Findings likely reflect parotitis. Remainder of the salivary glands are unremarkable. Thyroid: Unremarkable. Lymph nodes: No cervical adenopathy. Vascular: Vessels widely patent. Limited  intracranial: Unremarkable. Visualized orbits: Unremarkable. Mastoids and visualized paranasal sinuses: Clear. Skeleton: No acute bony abnormality. Degenerative disc disease throughout the cervical spine. Upper chest: Lung apices clear. Other: None IMPRESSION: Stranding within the inferior aspect of the left parotid gland and overlying soft  tissues likely reflects parotitis. Electronically Signed   By: Rolm Baptise M.D.   On: 03/06/2015 09:23    Scheduled Meds: . cefTRIAXone (ROCEPHIN)  IV  1 g Intravenous Q24H  . ferrous sulfate   Oral Daily  . ketorolac  30 mg Intravenous BID  . latanoprost  1 drop Both Eyes QHS  . metoprolol succinate  25 mg Oral Daily  . pantoprazole  40 mg Oral BID  . potassium chloride  20 mEq Oral BID  . timolol  1 drop Left Eye BID  . torsemide  40 mg Oral BID  . vancomycin  1,000 mg Intravenous Q12H   Continuous Infusions:   Active Problems:   Essential hypertension   GASTROESOPHAGEAL REFLUX DISEASE, CHRONIC   Thrombocytopenia ([facility])   Morbid obesity- BMI 44.5   Edema of both legs   Chronic diastolic CHF (congestive heart failure) ([facility])   Parotitis, acute   Injection site irritation    Time spent: 85mins    MEMON,JEHANZEB  [facility] Pager [phone]. If 7PM-7AM, please contact night-coverage at [email or web address], password [email or web address] 03/07/2015, 10:00 AM  LOS: 0 days

## 2015-03-08 DIAGNOSIS — L03114 Cellulitis of left upper limb: Secondary | ICD-10-CM

## 2015-03-08 LAB — BASIC METABOLIC PANEL
Anion gap: 11 (ref 5–15)
BUN: 19 mg/dL (ref 6–20)
CO2: 36 mmol/L — ABNORMAL HIGH (ref 22–32)
Calcium: 9.1 mg/dL (ref 8.9–10.3)
Chloride: 91 mmol/L — ABNORMAL LOW (ref 101–111)
Creatinine, Ser: 1.41 mg/dL — ABNORMAL HIGH (ref 0.61–1.24)
GFR calc Af Amer: 55 mL/min — ABNORMAL LOW (ref >60)
GFR, EST NON AFRICAN AMERICAN: 47 mL/min — AB (ref >60)
Glucose, Bld: 121 mg/dL — ABNORMAL HIGH (ref 65–99)
POTASSIUM: 3.8 mmol/L (ref 3.5–5.1)
SODIUM: 138 mmol/L (ref 135–145)

## 2015-03-08 LAB — CBC
HEMATOCRIT: 48.3 % (ref 39.0–52.0)
Hemoglobin: 16.3 g/dL (ref 13.0–17.0)
MCH: 26.7 pg (ref 26.0–34.0)
MCHC: 33.7 g/dL (ref 30.0–36.0)
MCV: 79.1 fL (ref 78.0–100.0)
PLATELETS: 120 10*3/uL — AB (ref 150–400)
RBC: 6.11 MIL/uL — ABNORMAL HIGH (ref 4.22–5.81)
RDW: 17.7 % — AB (ref 11.5–15.5)
WBC: 14.1 10*3/uL — AB (ref 4.0–10.5)

## 2015-03-08 MED ORDER — AMOXICILLIN-POT CLAVULANATE 875-125 MG PO TABS: 1.0000 | ORAL_TABLET | Freq: Two times a day (BID) | ORAL | Status: DC

## 2015-03-08 MED ORDER — TORSEMIDE 20 MG PO TABS: 40.0000 mg | ORAL_TABLET | Freq: Two times a day (BID) | ORAL | Status: DC

## 2015-03-08 MED ORDER — OXYCODONE-ACETAMINOPHEN 10-325 MG PO TABS: 1.0000 | ORAL_TABLET | Freq: Four times a day (QID) | ORAL | Status: DC | PRN

## 2015-03-08 NOTE — Discharge Summary (Signed)
Physician Discharge Summary  [name] [name] YTK:[id number] DOB: 1941/05/09 DOA: 03/06/2015  PCP: [name]., MD  Admit date: 03/06/2015 Discharge date: 03/08/2015  Time spent: 35 minutes  Recommendations for Outpatient Follow-up:  1. Follow up with primary care physician in 1 week to re evaluate parotitis  Discharge Diagnoses:  Active Problems:   Essential hypertension   GASTROESOPHAGEAL REFLUX DISEASE, CHRONIC   Thrombocytopenia (HCC)   Morbid obesity- BMI 44.5   Edema of both legs   Chronic diastolic CHF (congestive heart failure) (HCC)   Parotitis, acute   Injection site cellulitis   Discharge Condition: improving  Diet recommendation: low salt  Filed Weights   03/06/15 0721 03/06/15 1720  Weight: 126.1 kg (278 lb) 125.703 kg (277 lb 2 oz)    History of present illness:  This patient presented to the hospital with left face pain and swelling. He was evaluated in the emergency room where CT scan of the neck indicated left parotid gland peritonitis. He was noted to have a mild leukocytosis. He was admitted for further treatment.  Hospital Course:  Patient was started on intravenous vancomycin and was also treated with Rocephin. His left parotid swelling has improved since admission. He is still tender in the area and continues to complain of some facial pain but overall swelling and redness in the area has improved. He's not had any fevers and leukocytosis is improving. He'll be transitioned to a course of oral Augmentin to complete his antibiotic course.  Patient also recently had a Pneumovax vaccination in his left upper arm. He had surrounding cellulitis that was marked with a permanent marker. This does not appear to be extending further beyond the marked margins. His antibiotics should cover this infection as well. He does not have any signs of fluctuance or purulent drainage.  He did have a mild rising creatinine during his hospital stay. This was attributed to  diuretics in the setting of vancomycin as well as Toradol. Vancomycin and Toradol were discontinued. He's been advised to hold his diuretics for 2 days after which they can be resumed. He was to follow-up with his primary care physician in one week to reevaluate his parotitis.  Procedures:    Consultations:    Discharge Exam: Filed Vitals:   03/08/15 0930  BP: 160/105  Pulse: 91  Temp:   Resp:     General: NAD HEENT: left parotid swelling is improving Cardiovascular: S1, S2 RRR Respiratory: cta b  Discharge Instructions   Discharge Instructions    Diet - low sodium heart healthy    Complete by:  As directed      Increase activity slowly    Complete by:  As directed           Current Discharge Medication List    START taking these medications   Details  amoxicillin-clavulanate (AUGMENTIN) 875-125 MG tablet Take 1 tablet by mouth 2 (two) times daily. Qty: 10 tablet, Refills: 0      CONTINUE these medications which have CHANGED   Details  oxyCODONE-acetaminophen (PERCOCET) 10-325 MG tablet Take 1 tablet by mouth every 6 (six) hours as needed for pain. Qty: 30 tablet, Refills: 0    torsemide (DEMADEX) 20 MG tablet Take 2 tablets (40 mg total) by mouth 2 (two) times daily. Resume on 10/19      CONTINUE these medications which have NOT CHANGED   Details  acetaminophen (TYLENOL) 325 MG tablet Take 325-650 mg by mouth every 6 (six) hours as needed for pain.  cyclobenzaprine (FLEXERIL) 10 MG tablet Take 1 tablet (10 mg total) by mouth 3 (three) times daily as needed for muscle spasms. Qty: 45 tablet, Refills: 0    docusate sodium (COLACE) 100 MG capsule Take 100 mg by mouth daily as needed for mild constipation.     IRON PO Take 1 tablet by mouth daily.    latanoprost (XALATAN) 0.005 % ophthalmic solution Place 1 drop into both eyes at bedtime.    metoprolol succinate (TOPROL-XL) 25 MG 24 hr tablet Take 25 mg by mouth daily.    pantoprazole (PROTONIX) 40 MG  tablet Take 40 mg by mouth 2 (two) times daily.    Polyethyl Glycol-Propyl Glycol (LUBRICANT EYE DROPS) 0.4-0.3 % SOLN Apply 1 drop to eye daily as needed (for lubricant eye drops).    potassium chloride (KLOR-CON) 20 MEQ packet Take 20 mEq by mouth 2 (two) times daily.    promethazine (PHENERGAN) 12.5 MG tablet Take 12.5 mg by mouth every 6 (six) hours as needed for nausea or vomiting.    timolol (BETIMOL) 0.5 % ophthalmic solution Place 1 drop into the left eye 2 (two) times daily.       Allergies  Allergen Reactions  . Aspirin Other (See Comments)    Nausea and upset stomach    . Nexium [Esomeprazole Magnesium] Other (See Comments)    Patient said it messed his stomach up      The results of significant diagnostics from this hospitalization (including imaging, microbiology, ancillary and laboratory) are listed below for reference.    Significant Diagnostic Studies: Ct Head Wo Contrast  03/02/2015  CLINICAL DATA:  Right-sided headache. Nausea. Right neck pain and dizziness. EXAM: CT HEAD WITHOUT CONTRAST TECHNIQUE: Contiguous axial images were obtained from the base of the skull through the vertex without intravenous contrast. COMPARISON:  05/30/2014 FINDINGS: The brainstem, cerebellum, cerebral peduncles, thalamus, basal ganglia, basilar cisterns, and ventricular system appear within normal limits. No intracranial hemorrhage, mass lesion, or acute CVA. IMPRESSION: No significant abnormality identified. Electronically Signed   By: Van Clines M.D.   On: 03/02/2015 12:51   Ct Soft Tissue Neck W Contrast  03/06/2015  CLINICAL DATA:  Sudden onset of left ear pain and left side neck swelling this morning. Left parotid mass. EXAM: CT NECK WITH CONTRAST TECHNIQUE: Multidetector CT imaging of the neck was performed using the standard protocol following the bolus administration of intravenous contrast. CONTRAST:  66mL OMNIPAQUE IOHEXOL 300 MG/ML  SOLN COMPARISON:  None. FINDINGS:  Pharynx and larynx: Unremarkable.  Airway patent. Salivary glands: There is stranding within the inferior aspect of the left parotid gland with overlying soft tissue stranding. Findings likely reflect parotitis. Remainder of the salivary glands are unremarkable. Thyroid: Unremarkable. Lymph nodes: No cervical adenopathy. Vascular: Vessels widely patent. Limited intracranial: Unremarkable. Visualized orbits: Unremarkable. Mastoids and visualized paranasal sinuses: Clear. Skeleton: No acute bony abnormality. Degenerative disc disease throughout the cervical spine. Upper chest: Lung apices clear. Other: None IMPRESSION: Stranding within the inferior aspect of the left parotid gland and overlying soft tissues likely reflects parotitis. Electronically Signed   By: Rolm Baptise M.D.   On: 03/06/2015 09:23    Microbiology: No results found for this or any previous visit (from the past 240 hour(s)).   Labs: Basic Metabolic Panel:  Recent Labs Lab 03/06/15 0754 03/07/15 0556 03/08/15 0603  NA 137 135 138  K 4.3 3.5 3.8  CL 101 93* 91*  CO2 31 33* 36*  GLUCOSE 107* 116* 121*  BUN 14  16 19  CREATININE 1.07 1.15 1.41*  CALCIUM 9.0 9.0 9.1   Liver Function Tests: No results for input(s): AST, ALT, ALKPHOS, BILITOT, PROT, ALBUMIN in the last 168 hours. No results for input(s): LIPASE, AMYLASE in the last 168 hours. No results for input(s): AMMONIA in the last 168 hours. CBC:  Recent Labs Lab 03/06/15 0754 03/07/15 0556 03/08/15 0603  WBC 13.0* 18.8* 14.1*  NEUTROABS 10.7*  --   --   HGB 15.1 16.4 16.3  HCT 45.9 49.6 48.3  MCV 80.7 79.9 79.1  PLT 102* 139* 120*   Cardiac Enzymes: No results for input(s): CKTOTAL, CKMB, CKMBINDEX, TROPONINI in the last 168 hours. BNP: BNP (last 3 results) No results for input(s): BNP in the last 8760 hours.  ProBNP (last 3 results) No results for input(s): PROBNP in the last 8760 hours.  CBG: No results for input(s): GLUCAP in the last 168  hours.     Signed:  Dayanna Pryce  [facility] 03/08/2015, 9:42 AM

## 2015-03-08 NOTE — Progress Notes (Signed)
IV access removed.  Discharge instructions reviewed with patient and scripts given.  Waiting ride home.

## 2015-03-08 NOTE — Progress Notes (Signed)
Left via wheelchair for discharge with son.  Stable at discharge with discharge instructions and scripts.

## 2015-03-10 DIAGNOSIS — I739 Peripheral vascular disease, unspecified: Secondary | ICD-10-CM | POA: Diagnosis not present

## 2015-03-16 DIAGNOSIS — L0390 Cellulitis, unspecified: Secondary | ICD-10-CM | POA: Diagnosis not present

## 2015-03-16 DIAGNOSIS — I509 Heart failure, unspecified: Secondary | ICD-10-CM | POA: Diagnosis not present

## 2015-03-16 DIAGNOSIS — K1121 Acute sialoadenitis: Secondary | ICD-10-CM | POA: Diagnosis not present

## 2015-03-16 DIAGNOSIS — Z6841 Body Mass Index (BMI) 40.0 and over, adult: Secondary | ICD-10-CM | POA: Diagnosis not present

## 2015-03-16 DIAGNOSIS — Z1389 Encounter for screening for other disorder: Secondary | ICD-10-CM | POA: Diagnosis not present

## 2015-03-17 DIAGNOSIS — Z961 Presence of intraocular lens: Secondary | ICD-10-CM | POA: Diagnosis not present

## 2015-03-17 DIAGNOSIS — H35033 Hypertensive retinopathy, bilateral: Secondary | ICD-10-CM | POA: Diagnosis not present

## 2015-03-17 DIAGNOSIS — H2512 Age-related nuclear cataract, left eye: Secondary | ICD-10-CM | POA: Diagnosis not present

## 2015-03-17 DIAGNOSIS — H401133 Primary open-angle glaucoma, bilateral, severe stage: Secondary | ICD-10-CM | POA: Diagnosis not present

## 2015-03-20 DIAGNOSIS — M1991 Primary osteoarthritis, unspecified site: Secondary | ICD-10-CM | POA: Diagnosis not present

## 2015-03-20 DIAGNOSIS — F329 Major depressive disorder, single episode, unspecified: Secondary | ICD-10-CM | POA: Diagnosis not present

## 2015-03-20 DIAGNOSIS — K1121 Acute sialoadenitis: Secondary | ICD-10-CM | POA: Diagnosis not present

## 2015-03-20 DIAGNOSIS — Z6841 Body Mass Index (BMI) 40.0 and over, adult: Secondary | ICD-10-CM | POA: Diagnosis not present

## 2015-03-20 DIAGNOSIS — K1123 Chronic sialoadenitis: Secondary | ICD-10-CM | POA: Diagnosis not present

## 2015-03-27 DIAGNOSIS — H401133 Primary open-angle glaucoma, bilateral, severe stage: Secondary | ICD-10-CM | POA: Diagnosis not present

## 2015-04-21 DIAGNOSIS — I10 Essential (primary) hypertension: Secondary | ICD-10-CM | POA: Diagnosis not present

## 2015-04-21 DIAGNOSIS — L0390 Cellulitis, unspecified: Secondary | ICD-10-CM | POA: Diagnosis not present

## 2015-04-21 DIAGNOSIS — Z6841 Body Mass Index (BMI) 40.0 and over, adult: Secondary | ICD-10-CM | POA: Diagnosis not present

## 2015-04-21 DIAGNOSIS — L723 Sebaceous cyst: Secondary | ICD-10-CM | POA: Diagnosis not present

## 2015-04-21 DIAGNOSIS — Z1389 Encounter for screening for other disorder: Secondary | ICD-10-CM | POA: Diagnosis not present

## 2015-04-24 DIAGNOSIS — L03818 Cellulitis of other sites: Secondary | ICD-10-CM | POA: Diagnosis not present

## 2015-04-24 DIAGNOSIS — Z681 Body mass index (BMI) 19 or less, adult: Secondary | ICD-10-CM | POA: Diagnosis not present

## 2015-04-24 DIAGNOSIS — L723 Sebaceous cyst: Secondary | ICD-10-CM | POA: Diagnosis not present

## 2015-05-04 DIAGNOSIS — H401133 Primary open-angle glaucoma, bilateral, severe stage: Secondary | ICD-10-CM | POA: Diagnosis not present

## 2015-05-13 DIAGNOSIS — M25562 Pain in left knee: Secondary | ICD-10-CM | POA: Diagnosis not present

## 2015-05-13 DIAGNOSIS — M170 Bilateral primary osteoarthritis of knee: Secondary | ICD-10-CM | POA: Diagnosis not present

## 2015-05-13 DIAGNOSIS — M4727 Other spondylosis with radiculopathy, lumbosacral region: Secondary | ICD-10-CM | POA: Diagnosis not present

## 2015-05-13 DIAGNOSIS — M25561 Pain in right knee: Secondary | ICD-10-CM | POA: Diagnosis not present

## 2015-05-13 DIAGNOSIS — G894 Chronic pain syndrome: Secondary | ICD-10-CM | POA: Diagnosis not present

## 2015-05-13 DIAGNOSIS — M961 Postlaminectomy syndrome, not elsewhere classified: Secondary | ICD-10-CM | POA: Diagnosis not present

## 2015-05-13 DIAGNOSIS — M4806 Spinal stenosis, lumbar region: Secondary | ICD-10-CM | POA: Diagnosis not present

## 2015-05-26 DIAGNOSIS — I739 Peripheral vascular disease, unspecified: Secondary | ICD-10-CM | POA: Diagnosis not present

## 2015-06-12 ENCOUNTER — Ambulatory Visit (INDEPENDENT_AMBULATORY_CARE_PROVIDER_SITE_OTHER): Payer: Medicare Other | Admitting: Gastroenterology

## 2015-06-12 ENCOUNTER — Encounter: Payer: Self-pay | Admitting: Gastroenterology

## 2015-06-12 VITALS — BP 160/90 | HR 87 | Temp 97.3°F | Ht 66.0 in | Wt 291.8 lb

## 2015-06-12 DIAGNOSIS — K5900 Constipation, unspecified: Secondary | ICD-10-CM | POA: Diagnosis not present

## 2015-06-12 DIAGNOSIS — K219 Gastro-esophageal reflux disease without esophagitis: Secondary | ICD-10-CM

## 2015-06-12 NOTE — Patient Instructions (Signed)
1. Continue pantoprazole twice daily for reflux. 2. Continue Zofran as needed for nausea. 3. We will see you back in 6 months or call sooner if needed.

## 2015-06-12 NOTE — Assessment & Plan Note (Signed)
Doing very well. Requires pantoprazole twice a day but utilizes Zofran just as needed, not daily. Reiterated antireflux measures. Continue current regimen. Return to the office in 6 months or sooner if needed.

## 2015-06-12 NOTE — Progress Notes (Signed)
cc'ed to pcp °

## 2015-06-12 NOTE — Progress Notes (Addendum)
Primary Care Physician: [name]., MD  Primary Gastroenterologist:  Garfield Cornea, MD   Chief Complaint  Patient presents with  . Follow-up    HPI: [name] [name] is a 75 y.o. male here for six-month follow-up of GERD. EGD and colonoscopy January 2016 with noncritical Schatzki ring status post dilation, mild chronic inflammation on gastric biopsy. Internal hemorrhoids, scattered left-sided diverticula, distal transverse polyps were tubular adenomas. Next colonoscopy planned for January 2021.  Switch from Phenergan to Zofran at some point by PCP. Doesn't have to take the Zofran every day. Uses as needed only. Reflux well controlled on pantoprazole but requires twice a day dosing.  Takes stool softener every night. Requires because he takes pain medication once daily (Percocet). No melena, brbpr.   Daughter having colonoscopy today, mass on colon.   Patient has a prescription for Tylenol and Percocet with Tylenol. Discussed at length with patient. Needs to be careful with total acetaminophen intake. Currently he is taking less than 1000 mg of acetaminophen daily.   Current Outpatient Prescriptions  Medication Sig Dispense Refill  . acetaminophen (TYLENOL) 325 MG tablet Take 325-650 mg by mouth every 6 (six) hours as needed for pain.     . cyclobenzaprine (FLEXERIL) 10 MG tablet Take 1 tablet (10 mg total) by mouth 3 (three) times daily as needed for muscle spasms. 45 tablet 0  . docusate sodium (COLACE) 100 MG capsule Take 100 mg by mouth daily as needed for mild constipation.     . IRON PO Take 1 tablet by mouth daily.    Marland Kitchen latanoprost (XALATAN) 0.005 % ophthalmic solution Place 1 drop into both eyes at bedtime.    . metoprolol succinate (TOPROL-XL) 25 MG 24 hr tablet Take 25 mg by mouth daily.    . ondansetron (ZOFRAN) 4 MG tablet Take 4 mg by mouth every 8 (eight) hours as needed for nausea or vomiting.    Marland Kitchen oxyCODONE-acetaminophen (PERCOCET) 10-325 MG tablet Take 1  tablet by mouth every 6 (six) hours as needed for pain. 30 tablet 0  . pantoprazole (PROTONIX) 40 MG tablet Take 40 mg by mouth 2 (two) times daily.    Vladimir Faster Glycol-Propyl Glycol (LUBRICANT EYE DROPS) 0.4-0.3 % SOLN Apply 1 drop to eye daily as needed (for lubricant eye drops).    . potassium chloride (KLOR-CON) 20 MEQ packet Take 20 mEq by mouth 2 (two) times daily.    . timolol (BETIMOL) 0.5 % ophthalmic solution Place 1 drop into the left eye 2 (two) times daily.    Marland Kitchen torsemide (DEMADEX) 20 MG tablet Take 2 tablets (40 mg total) by mouth 2 (two) times daily. Resume on 10/19     No current facility-administered medications for this visit.    Allergies as of 06/12/2015 - Review Complete 06/12/2015  Allergen Reaction Noted  . Aspirin Other (See Comments)   . Nexium [esomeprazole magnesium] Other (See Comments) 11/14/2013   Past Medical History  Diagnosis Date  . CHF (congestive heart failure) ([place])   . Obesity   . Hyperlipidemia   . PUD (peptic ulcer disease)   . GERD (gastroesophageal reflux disease)   . S/P endoscopy 2007, 2012    2007: nl, May 2012: antral erosions  . Osteoarthritis   . S/P colonoscopy 2007, Feb and March 2012    hx of adenomas, left-sided diverticula, On 07/2010 TCS, fresh blood and clot noted coming from TI.  Marland Kitchen Anxiety   . Hypothyroidism     hypothyroid  .  Ringing in ears   . History of kidney stones   . History of bladder infections   . Heel spur     right heel  . Collagen vascular disease ([place])     history of venous insufficency and LE cellulitis  . Thrombocytopenia ([place]) 12/30/2011    Stable  . BPH (benign prostatic hyperplasia)   . Hypertension     Sees Dr. Debara Pickett  . Bilateral lower leg cellulitis   . Sleep apnea     uses oxygen 2L via [place] cannot tolerate CPAP - sleep study 2008 AHI 48.80/hr and 56.70hr during REM  . Complication of anesthesia     due to sleep apnea pt has had difficulty being put under anesthesia  . PONV (postoperative nausea  and vomiting)   . Deep venous insufficiency   . Chronic diastolic HF (heart failure) ([place])   . SIRS (systemic inflammatory response syndrome) ([place]) 02/27/2012  . History of colonic polyps    Past Surgical History  Procedure Laterality Date  . Knee arthroscopy    . Rotator cuff repair    . Cholecystectomy    . Lumbar spine surgery    . Small bowel capsule study  07/2011    ?transient focal ischemia in distal small bowel to explain GI bleeding  . Esophagogastroduodenoscopy  Aug 2013    [facility], single 59mm pedunculated polyp otherwise normal. HYPERPLASTIC, NEGATIVE h.pylori  . Cardiac catheterization  02/27/2007    no sign of CAD, LVH, mod pulm HTN (Dr. Jackie Plum(  . Transurethral resection of prostate    . Transurethral resection of prostate N/A 06/18/2013    Procedure: TRANSURETHRAL RESECTION OF THE PROSTATE (TURP);  Surgeon: Marissa Nestle, MD;  Location: [facility];  Service: Urology;  Laterality: N/A;  . Transthoracic echocardiogram  09/2008    EF 60-65%, mod conc LVH  . Nm myocar perf wall motion  12/2006    dipyridamole myoview - stress images show mild perfusion defect in mid inferior walls with reversibility at rest; mild perfusion defect in mid inferolateral wall at stress with mild defect reversibility, EF 62%, abnormal but low risk study   . Esophagogastroduodenoscopy N/A 05/28/2014    RMR:non-critical Schatzki's ring/HH. Benign gastritis  . Savory dilation N/A 05/28/2014    Procedure: SAVORY DILATION;  Surgeon: Daneil Dolin, MD;  Location: [facility];  Service: Endoscopy;  Laterality: N/A;  Venia Minks dilation N/A 05/28/2014    Procedure: Venia Minks DILATION;  Surgeon: Daneil Dolin, MD;  Location: [facility];  Service: Endoscopy;  Laterality: N/A;  . Colonoscopy N/A 05/28/2014    [id number] colonic polyps/colonic diverticulosis. Tubular adenomas. Next colonoscopy January 2021    ROS:  General: Negative for anorexia, weight loss, fever, chills, fatigue, weakness. ENT: Negative for  hoarseness, difficulty swallowing , nasal congestion. CV: Negative for chest pain, angina, palpitations, dyspnea on exertion, peripheral edema.  Respiratory: Negative for dyspnea at rest, dyspnea on exertion, cough, sputum, wheezing.  GI: See history of present illness. GU:  Negative for dysuria, hematuria, urinary incontinence, urinary frequency, nocturnal urination.  Endo: Negative for unusual weight change.    Physical Examination:   BP 160/90 mmHg  Pulse 87  Temp(Src) 97.3 F (36.3 C)  Ht 5\' 6"  (1.676 m)  Wt 291 lb 12.8 oz (132.36 kg)  BMI 47.12 kg/m2  General: Well-nourished, well-developed in no acute distress.  Eyes: No icterus. Mouth: Oropharyngeal mucosa moist and pink , no lesions erythema or exudate. Lungs: Clear to auscultation bilaterally.  Heart: Regular  rate and rhythm, no murmurs rubs or gallops.  Abdomen: Bowel sounds are normal, nontender, nondistended, no hepatosplenomegaly or masses, no abdominal bruits or hernia , no rebound or guarding.   Extremities: No lower extremity edema. No clubbing or deformities. Neuro: Alert and oriented x 4   Skin: Warm and dry, no jaundice.   Psych: Alert and cooperative, normal mood and affect.  Labs:  Lab Results  Component Value Date   CREATININE 1.41* 03/08/2015   BUN 19 03/08/2015   NA 138 03/08/2015   K 3.8 03/08/2015   CL 91* 03/08/2015   CO2 36* 03/08/2015   Lab Results  Component Value Date   WBC 14.1* 03/08/2015   HGB 16.3 03/08/2015   HCT 48.3 03/08/2015   MCV 79.1 03/08/2015   PLT 120* 03/08/2015   Lab Results  Component Value Date   ALT 21 09/01/2013   AST 22 09/01/2013   ALKPHOS 110 09/01/2013   BILITOT 1.7* 09/01/2013     Imaging Studies: No results found.

## 2015-06-12 NOTE — Assessment & Plan Note (Signed)
Continues to be manageable with Colace. Discussed other options. If he requires further change in therapy he'll let me know.

## 2015-06-18 ENCOUNTER — Ambulatory Visit (INDEPENDENT_AMBULATORY_CARE_PROVIDER_SITE_OTHER): Payer: Medicare Other | Admitting: Otolaryngology

## 2015-06-18 DIAGNOSIS — J310 Chronic rhinitis: Secondary | ICD-10-CM

## 2015-06-18 DIAGNOSIS — J342 Deviated nasal septum: Secondary | ICD-10-CM

## 2015-06-18 DIAGNOSIS — H9209 Otalgia, unspecified ear: Secondary | ICD-10-CM | POA: Diagnosis not present

## 2015-06-18 DIAGNOSIS — J343 Hypertrophy of nasal turbinates: Secondary | ICD-10-CM | POA: Diagnosis not present

## 2015-07-16 ENCOUNTER — Ambulatory Visit (INDEPENDENT_AMBULATORY_CARE_PROVIDER_SITE_OTHER): Payer: Medicare Other | Admitting: Otolaryngology

## 2015-07-16 DIAGNOSIS — J343 Hypertrophy of nasal turbinates: Secondary | ICD-10-CM

## 2015-07-16 DIAGNOSIS — J342 Deviated nasal septum: Secondary | ICD-10-CM | POA: Diagnosis not present

## 2015-07-17 ENCOUNTER — Encounter (HOSPITAL_COMMUNITY): Payer: Medicare Other | Attending: Hematology & Oncology

## 2015-07-17 ENCOUNTER — Encounter (HOSPITAL_BASED_OUTPATIENT_CLINIC_OR_DEPARTMENT_OTHER): Payer: Medicare Other | Admitting: Hematology & Oncology

## 2015-07-17 ENCOUNTER — Ambulatory Visit (HOSPITAL_COMMUNITY): Payer: Medicare Other | Admitting: Hematology & Oncology

## 2015-07-17 VITALS — BP 162/78 | HR 71 | Temp 98.5°F | Resp 18 | Wt 287.0 lb

## 2015-07-17 DIAGNOSIS — D72829 Elevated white blood cell count, unspecified: Secondary | ICD-10-CM

## 2015-07-17 DIAGNOSIS — D72828 Other elevated white blood cell count: Secondary | ICD-10-CM

## 2015-07-17 DIAGNOSIS — D696 Thrombocytopenia, unspecified: Secondary | ICD-10-CM

## 2015-07-17 LAB — CBC WITH DIFFERENTIAL/PLATELET
BASOS ABS: 0 10*3/uL (ref 0.0–0.1)
Basophils Relative: 0 %
EOS ABS: 0.1 10*3/uL (ref 0.0–0.7)
EOS PCT: 1 %
HCT: 46 % (ref 39.0–52.0)
Hemoglobin: 15 g/dL (ref 13.0–17.0)
Lymphocytes Relative: 10 %
Lymphs Abs: 1 10*3/uL (ref 0.7–4.0)
MCH: 26.2 pg (ref 26.0–34.0)
MCHC: 32.6 g/dL (ref 30.0–36.0)
MCV: 80.4 fL (ref 78.0–100.0)
Monocytes Absolute: 0.9 10*3/uL (ref 0.1–1.0)
Monocytes Relative: 9 %
NEUTROS PCT: 81 %
Neutro Abs: 8.5 10*3/uL — ABNORMAL HIGH (ref 1.7–7.7)
PLATELETS: 90 10*3/uL — AB (ref 150–400)
RBC: 5.72 MIL/uL (ref 4.22–5.81)
RDW: 17.6 % — AB (ref 11.5–15.5)
Smear Review: DECREASED
WBC: 10.6 10*3/uL — AB (ref 4.0–10.5)

## 2015-07-17 NOTE — Progress Notes (Signed)
[place] [facility] [place] [place] [address]  Thrombocytopenia [facility]) - Plan: CBC with Differential  CURRENT THERAPY: Observation  INTERVAL HISTORY: [name] [name] 75 y.o. male returns for followup of waxing and waning thrombocytopenia, stable AND Mild leukocytosis, stable   I reviewed the patient's chart back to at least 2012. It is noted that his white blood cell count and platelet count is very stable dating back to 2012. Since 2012, his white blood cell count has remained within a stable range not exceeding 20.4. His white blood cell count has very stable. In addition, his platelet count has remained stable ranging between 82-156. Again, his platelet count is very safe period.  [name] [name] returns to the [place] alone today. He says that, to be honest, he is hungry and thirsty.  He is going to the dentist Tuesday. He also notes that  Dr. Benjamine Mola is going to operate on his sinuses.  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 He remarks that his appetite is fairly good. He says that he does not follow anyone for his diagnosis of fatty liver.  He notes that he has had a colonoscopy, two or three times, with Dr. Sydell Axon.  He denies any B symptoms.   Past Medical History  Diagnosis Date  . CHF (congestive heart failure) ([place])   . Obesity   . Hyperlipidemia   . PUD (peptic ulcer disease)   . GERD (gastroesophageal reflux disease)   . S/P endoscopy 2007, 2012    2007: nl, May 2012: antral erosions  . Osteoarthritis   . S/P colonoscopy 2007, Feb and March 2012    hx of adenomas, left-sided diverticula, On 07/2010 TCS, fresh blood and clot noted coming from TI.  Marland Kitchen Anxiety   .  Hypothyroidism     hypothyroid  . Ringing in ears   . History of kidney stones   . History of bladder infections   . Heel spur     right heel  . Collagen vascular disease ([place])     history of venous insufficency and LE cellulitis  . Thrombocytopenia ([place]) 12/30/2011    Stable  . BPH (benign prostatic hyperplasia)   . Hypertension     Sees Dr. Debara Pickett  . Bilateral lower leg cellulitis   . Sleep apnea     uses oxygen 2L via [place] cannot tolerate CPAP - sleep study 2008 AHI 48.80/hr and 56.70hr during REM  . Complication of anesthesia     due to sleep apnea pt has had difficulty being put under anesthesia  . PONV (postoperative nausea and vomiting)   . Deep venous insufficiency   . Chronic diastolic HF (heart failure) ([place])   . SIRS (systemic inflammatory response syndrome) ([place]) 02/27/2012  . History of colonic polyps     has Essential hypertension; GASTROESOPHAGEAL REFLUX DISEASE, CHRONIC; FATTY LIVER DISEASE; CHOLELITHIASIS, SYMPTOMATIC; RENAL INSUFFICIENCY; ABDOMINAL BLOATING; COLONIC POLYPS, ADENOMATOUS, HX OF; NAUSEA; DIARRHEA; Rectal bleeding; Constipation; Hypothyroidism; Upper abdominal pain; Anorexia; Bowel habit changes; Thrombocytopenia (); SIRS (systemic inflammatory response syndrome) (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     is allergic to aspirin and nexium.  [name] [name] does not currently have medications on file.  Past Surgical History  Procedure Laterality Date  . Knee arthroscopy    . Rotator cuff repair    . Cholecystectomy    . Lumbar spine surgery    . Small bowel capsule study  07/2011    ?transient focal ischemia in distal small bowel to explain GI bleeding  . Esophagogastroduodenoscopy  Aug 2013    [facility], single 30m pedunculated polyp otherwise normal. HYPERPLASTIC, NEGATIVE h.pylori  . Cardiac catheterization  02/27/2007    no sign of CAD, LVH, mod pulm HTN (Dr. JJackie Plum  . Transurethral resection of prostate    . Transurethral resection of prostate N/A 06/18/2013    Procedure: TRANSURETHRAL RESECTION OF THE PROSTATE (TURP);  Surgeon: MMarissa Nestle MD;  Location: [facility] ORS;  Service: Urology;  Laterality: N/A;  . Transthoracic echocardiogram  09/2008    EF 60-65%, mod conc LVH  . Nm myocar perf wall motion  12/2006    dipyridamole myoview - stress images show mild perfusion defect in mid inferior walls with reversibility at rest; mild perfusion defect in mid inferolateral wall at stress with mild defect reversibility, EF 62%, abnormal but low risk study   . Esophagogastroduodenoscopy N/A 05/28/2014    RMR:non-critical Schatzki's ring/HH. Benign gastritis  . Savory dilation N/A 05/28/2014    Procedure: SAVORY DILATION;  Surgeon: RDaneil Dolin MD;  Location: [facility] END[facility] SUITE;  Service: Endoscopy;  Laterality: N/A;  .Venia Minksdilation N/A 05/28/2014    Procedure: MVenia MinksDILATION;  Surgeon: RDaneil Dolin MD;  Location: [facility] END[facility] SU[facility];  Service: Endoscopy;  Laterality: N/A;  . Colonoscopy N/A 05/28/2014    RACZ:YSAYTKZScolonic polyps/colonic diverticulosis. Tubular adenomas. Next colonoscopy January 2021    Denies any headaches, dizziness, double vision, fevers,  chills, night sweats, nausea, vomiting, diarrhea, constipation, chest pain, heart palpitations, shortness of breath, blood in stool, black tarry stool, urinary pain, urinary burning, urinary frequency, hematuria.  14 point review of systems was performed and is negative except as detailed under history of present illness and above    PHYSICAL EXAMINATION  ECOG PERFORMANCE STATUS: 0 - Asymptomatic  Filed Vitals:   07/17/15 1234  BP: 162/78  Pulse: 71  Temp: 98.5 F (36.9 C)  Resp: 18    GENERAL:alert, healthy, no distress, well nourished, well developed, comfortable, cooperative, obese and smiling SKIN: skin color, texture, turgor are normal, no rashes or significant lesions HEAD: Normocephalic, No masses, lesions, tenderness or abnormalities EYES: normal, PERRLA, EOMI, Conjunctiva are pink and non-injected EARS: External ears normal OROPHARYNX:lips, buccal mucosa, and tongue normal and mucous membranes are moist  NECK: supple, no adenopathy, thyroid normal size, non-tender, without nodularity, no stridor, non-tender, trachea midline LYMPH:  no palpable lymphadenopathy, no hepatosplenomegaly BREAST:not examined LUNGS: clear to auscultation and percussion HEART: regular rate & rhythm, no murmurs, no gallops, S1 normal and S2 normal ABDOMEN:abdomen soft, non-tender, obese, normal bowel sounds, no  masses or organomegaly and organomegaly exam hindered due to body habitus BACK: Back symmetric, no curvature., No CVA tenderness EXTREMITIES:less then 2 second capillary refill, no joint deformities, effusion, or inflammation, no edema, no skin discoloration, no clubbing, no cyanosis  NEURO: alert & oriented x 3 with fluent speech, no focal motor/sensory deficits, gait normal   LABORATORY DATA: I have reviewed the data as listed.  CBC    Component Value Date/Time   WBC 10.6* 07/17/2015 1157   RBC 5.72 07/17/2015 1157   HGB 15.0 07/17/2015 1157   HCT 46.0 07/17/2015 1157   PLT 90*  07/17/2015 1157   MCV 80.4 07/17/2015 1157   MCH 26.2 07/17/2015 1157   MCHC 32.6 07/17/2015 1157   RDW 17.6* 07/17/2015 1157   LYMPHSABS 1.0 07/17/2015 1157   MONOABS 0.9 07/17/2015 1157   EOSABS 0.1 07/17/2015 1157   BASOSABS 0.0 07/17/2015 1157      Chemistry      Component Value Date/Time   NA 138 03/08/2015 0603   K 3.8 03/08/2015 0603   CL 91* 03/08/2015 0603   CO2 36* 03/08/2015 0603   BUN 19 03/08/2015 0603   CREATININE 1.41* 03/08/2015 0603   CREATININE 1.08 11/14/2013 1125      Component Value Date/Time   CALCIUM 9.1 03/08/2015 0603   ALKPHOS 110 09/01/2013 1037   AST 22 09/01/2013 1037   ALT 21 09/01/2013 1037   BILITOT 1.7* 09/01/2013 1037     Results for Toney, Yandriel (MRN [id number])   Ref. Range 09/01/2013 10:37 09/02/2013 04:36 09/03/2013 05:42 03/06/2014 10:16 07/17/2014 10:15 02/02/2015 12:12 03/06/2015 07:54 03/07/2015 05:56 03/08/2015 06:03 07/17/2015 11:57  WBC Latest Ref Range: 4.0-10.5 K/uL 18.7 (H) 15.5 (H) 14.4 (H) 14.3 (H) 10.5 15.7 (H) 13.0 (H) 18.8 (H) 14.1 (H) 10.6 (H)   Results for Berggren, Loney (MRN [id number])   Ref. Range 09/01/2013 10:37 09/02/2013 04:36 09/03/2013 05:42 03/06/2014 10:16 07/17/2014 10:15 02/02/2015 12:12 03/06/2015 07:54 03/07/2015 05:56 03/08/2015 06:03 07/17/2015 11:57  Platelets Latest Ref Range: 150-400 K/uL 152 136 (L) 117 (L) 133 (L) 134 (L) 131 (L) 102 (L) 139 (L) 120 (L) 90 (L)     ASSESSMENT AND PLAN:  Leukocytosis, neutrophilia Thrombocytopenia Fatty Liver (on problem list) Negative BCR-ABL Normal ESR, CRP Negative JAK 2 V617F mutation, negative Exon 12,13 mutation Normal liver/spleen on CT 05/2013 Low ferritin at 33 ng/ml  His WBC count is stable, predominantly a neutrophilia. BCR-ABL is negative. Flow cytometry per other notes has been sent but unavailable, will contact pathology for further confirmation and to obtain the report. Last imaging in 2015 did not reveal fatty liver but it is mentioned on his problem  list, this could certainly be a potential contributor to his thrombocytopenia.  I have tentatively scheduled him for a 6 month follow-up with CBC with differential.  If flow cytometry is not available, I will have the patient return sooner for eval. Platelets are slightly lower today.  I reviewed signs/symptoms of concern such as nose/gum bleeding or unusual bruising. He is to notify us if he develops any problems. We may need to consider an abdominal ultrasound prior to follow-up as well.   6 month follow-up.  Orders Placed This Encounter  Procedures  . CBC with Differential    Standing Status: Future     Number of Occurrences:      Standing Expiration Date: 07/16/2016   All questions were answered. The patient knows to call the clinic with any problems, questions or concerns. We can certainly  see the patient much sooner if necessary.  This document serves as a record of services personally performed by Ancil Linsey, MD. It was created on her behalf by Toni Amend, a trained medical scribe. The creation of this record is based on the scribe's personal observations and the provider's statements to them. This document has been checked and approved by the attending provider.  I have reviewed the above documentation for accuracy and completeness, and I agree with the above.   This note is electronically signed by: Molli Hazard, MD  07/17/2015 2:02 PM

## 2015-07-17 NOTE — Patient Instructions (Addendum)
[place] at [facility] Discharge Instructions  RECOMMENDATIONS MADE BY THE CONSULTANT AND ANY TEST RESULTS WILL BE SENT TO YOUR REFERRING PHYSICIAN.    Exam and discussion by Dr Whitney Muse today If your platelets get worse over the next year, then we will send you to a GI doctor  Return to see the doctor in 6 months  If you have any problems with nose bleeds, gums bleeding, abnormal bruising please call us and let us know  Platelets were 90,000 today Please call the clinic if you have any questions or concerns    Thank you for choosing [place] at [facility] to provide your oncology and hematology care.  To afford each patient quality time with our provider, please arrive at least 15 minutes before your scheduled appointment time.   Beginning January 23rd 2017 lab work for the [facility] will be done in the  Main lab at [facility] on 1st floor. If you have a lab appointment with the [facility] please come in thru the  Main Entrance and check in at the main information desk  You need to re-schedule your appointment should you arrive 10 or more minutes late.  We strive to give you quality time with our providers, and arriving late affects you and other patients whose appointments are after yours.  Also, if you no show three or more times for appointments you may be dismissed from the clinic at the providers discretion.     Again, thank you for choosing [facility].  Our hope is that these requests will decrease the amount of time that you wait before being seen by our physicians.       _____________________________________________________________  Should you have questions after your visit to [facility], please contact our office at [phone] between the hours of 8:30 a.m. and 4:30 p.m.  Voicemails left after 4:30 p.m. will not be returned until the following business day.  For prescription refill requests,  have your pharmacy contact our office.

## 2015-07-22 DIAGNOSIS — Z6841 Body Mass Index (BMI) 40.0 and over, adult: Secondary | ICD-10-CM | POA: Diagnosis not present

## 2015-07-22 DIAGNOSIS — M1991 Primary osteoarthritis, unspecified site: Secondary | ICD-10-CM | POA: Diagnosis not present

## 2015-07-22 DIAGNOSIS — R5383 Other fatigue: Secondary | ICD-10-CM | POA: Diagnosis not present

## 2015-07-22 DIAGNOSIS — I10 Essential (primary) hypertension: Secondary | ICD-10-CM | POA: Diagnosis not present

## 2015-07-22 DIAGNOSIS — E782 Mixed hyperlipidemia: Secondary | ICD-10-CM | POA: Diagnosis not present

## 2015-07-22 DIAGNOSIS — E063 Autoimmune thyroiditis: Secondary | ICD-10-CM | POA: Diagnosis not present

## 2015-07-22 DIAGNOSIS — Z1389 Encounter for screening for other disorder: Secondary | ICD-10-CM | POA: Diagnosis not present

## 2015-07-22 DIAGNOSIS — G894 Chronic pain syndrome: Secondary | ICD-10-CM | POA: Diagnosis not present

## 2015-07-29 DIAGNOSIS — M961 Postlaminectomy syndrome, not elsewhere classified: Secondary | ICD-10-CM | POA: Diagnosis not present

## 2015-07-29 DIAGNOSIS — M25562 Pain in left knee: Secondary | ICD-10-CM | POA: Diagnosis not present

## 2015-07-29 DIAGNOSIS — M170 Bilateral primary osteoarthritis of knee: Secondary | ICD-10-CM | POA: Diagnosis not present

## 2015-07-29 DIAGNOSIS — M25561 Pain in right knee: Secondary | ICD-10-CM | POA: Diagnosis not present

## 2015-07-29 DIAGNOSIS — M4806 Spinal stenosis, lumbar region: Secondary | ICD-10-CM | POA: Diagnosis not present

## 2015-08-02 ENCOUNTER — Encounter (HOSPITAL_COMMUNITY): Payer: Self-pay | Admitting: Hematology & Oncology

## 2015-08-03 DIAGNOSIS — H401133 Primary open-angle glaucoma, bilateral, severe stage: Secondary | ICD-10-CM | POA: Diagnosis not present

## 2015-08-04 DIAGNOSIS — I739 Peripheral vascular disease, unspecified: Secondary | ICD-10-CM | POA: Diagnosis not present

## 2015-08-04 NOTE — Progress Notes (Signed)
Chart reviewed by Dr Al Corpus, due to pt's hx of severe OSA with AHI of 56, and the fact that he does not tolerate his CPAP and has to use 02 at 2l/[place] at night, he will be better served being done at [place]

## 2015-08-26 DIAGNOSIS — I5042 Chronic combined systolic (congestive) and diastolic (congestive) heart failure: Secondary | ICD-10-CM | POA: Diagnosis not present

## 2015-08-26 DIAGNOSIS — R52 Pain, unspecified: Secondary | ICD-10-CM | POA: Diagnosis not present

## 2015-08-26 DIAGNOSIS — I10 Essential (primary) hypertension: Secondary | ICD-10-CM | POA: Diagnosis not present

## 2015-08-26 DIAGNOSIS — L02239 Carbuncle of trunk, unspecified: Secondary | ICD-10-CM | POA: Diagnosis not present

## 2015-08-26 DIAGNOSIS — J019 Acute sinusitis, unspecified: Secondary | ICD-10-CM | POA: Diagnosis not present

## 2015-08-26 DIAGNOSIS — Z1389 Encounter for screening for other disorder: Secondary | ICD-10-CM | POA: Diagnosis not present

## 2015-08-26 DIAGNOSIS — Z6841 Body Mass Index (BMI) 40.0 and over, adult: Secondary | ICD-10-CM | POA: Diagnosis not present

## 2015-08-26 DIAGNOSIS — M1991 Primary osteoarthritis, unspecified site: Secondary | ICD-10-CM | POA: Diagnosis not present

## 2015-08-26 DIAGNOSIS — R42 Dizziness and giddiness: Secondary | ICD-10-CM | POA: Diagnosis not present

## 2015-08-27 DIAGNOSIS — M545 Low back pain: Secondary | ICD-10-CM | POA: Diagnosis not present

## 2015-08-27 DIAGNOSIS — M47817 Spondylosis without myelopathy or radiculopathy, lumbosacral region: Secondary | ICD-10-CM | POA: Diagnosis not present

## 2015-08-28 DIAGNOSIS — M545 Low back pain: Secondary | ICD-10-CM | POA: Diagnosis not present

## 2015-08-28 DIAGNOSIS — M47817 Spondylosis without myelopathy or radiculopathy, lumbosacral region: Secondary | ICD-10-CM | POA: Diagnosis not present

## 2015-08-28 DIAGNOSIS — M79652 Pain in left thigh: Secondary | ICD-10-CM | POA: Diagnosis not present

## 2015-08-31 DIAGNOSIS — R51 Headache: Secondary | ICD-10-CM | POA: Diagnosis not present

## 2015-08-31 DIAGNOSIS — J329 Chronic sinusitis, unspecified: Secondary | ICD-10-CM | POA: Diagnosis not present

## 2015-08-31 DIAGNOSIS — R42 Dizziness and giddiness: Secondary | ICD-10-CM | POA: Diagnosis not present

## 2015-09-03 DIAGNOSIS — Z23 Encounter for immunization: Secondary | ICD-10-CM | POA: Diagnosis not present

## 2015-09-03 DIAGNOSIS — E782 Mixed hyperlipidemia: Secondary | ICD-10-CM | POA: Diagnosis not present

## 2015-09-03 DIAGNOSIS — Z1389 Encounter for screening for other disorder: Secondary | ICD-10-CM | POA: Diagnosis not present

## 2015-09-03 DIAGNOSIS — Z125 Encounter for screening for malignant neoplasm of prostate: Secondary | ICD-10-CM | POA: Diagnosis not present

## 2015-09-03 DIAGNOSIS — Z0000 Encounter for general adult medical examination without abnormal findings: Secondary | ICD-10-CM | POA: Diagnosis not present

## 2015-09-03 DIAGNOSIS — Z6841 Body Mass Index (BMI) 40.0 and over, adult: Secondary | ICD-10-CM | POA: Diagnosis not present

## 2015-09-10 ENCOUNTER — Other Ambulatory Visit (HOSPITAL_COMMUNITY): Payer: Self-pay | Admitting: Medical

## 2015-09-10 ENCOUNTER — Other Ambulatory Visit: Payer: Self-pay | Admitting: Medical

## 2015-09-10 DIAGNOSIS — N183 Chronic kidney disease, stage 3 unspecified: Secondary | ICD-10-CM

## 2015-09-11 ENCOUNTER — Ambulatory Visit: Payer: Medicare Other | Admitting: Internal Medicine

## 2015-09-11 ENCOUNTER — Ambulatory Visit (INDEPENDENT_AMBULATORY_CARE_PROVIDER_SITE_OTHER): Payer: Medicare Other | Admitting: Internal Medicine

## 2015-09-11 ENCOUNTER — Encounter: Payer: Self-pay | Admitting: Internal Medicine

## 2015-09-11 VITALS — BP 158/96 | HR 77 | Temp 96.7°F | Ht 66.0 in | Wt 277.4 lb

## 2015-09-11 DIAGNOSIS — R110 Nausea: Secondary | ICD-10-CM

## 2015-09-11 DIAGNOSIS — K5909 Other constipation: Secondary | ICD-10-CM | POA: Diagnosis not present

## 2015-09-11 DIAGNOSIS — K219 Gastro-esophageal reflux disease without esophagitis: Secondary | ICD-10-CM

## 2015-09-11 NOTE — Progress Notes (Signed)
Primary Care Physician:  [name]., MD Primary Gastroenterologist:  Dr. Gala Romney  Pre-Procedure History & Physical: HPI:  [name] [name] is a 75 y.o. male here for followup of nausea, GERD and constipation. He's lost 14 pounds since he was last here-conscious effort becoming more healthy. Protonix 40 mg twice daily to control GERD. Has been using Zofran 4 mg each morning to combat nausea. If he does not have this medication, it is really difficult for him to function. He does not actually vomit. He has no abdominal pain,  no melena or rectal bleeding. Dysphagia resolved status post Maloney dilation. Bowels are working well now with use of stool softener and fiber .  Past Medical History  Diagnosis Date  . CHF (congestive heart failure) ([place])   . Obesity   . Hyperlipidemia   . PUD (peptic ulcer disease)   . GERD (gastroesophageal reflux disease)   . S/P endoscopy 2007, 2012    2007: nl, May 2012: antral erosions  . Osteoarthritis   . S/P colonoscopy 2007, Feb and March 2012    hx of adenomas, left-sided diverticula, On 07/2010 TCS, fresh blood and clot noted coming from TI.  Marland Kitchen Anxiety   . Hypothyroidism     hypothyroid  . Ringing in ears   . History of kidney stones   . History of bladder infections   . Heel spur     right heel  . Collagen vascular disease ([place])     history of venous insufficency and LE cellulitis  . Thrombocytopenia ([place]) 12/30/2011    Stable  . BPH (benign prostatic hyperplasia)   . Hypertension     Sees Dr. Debara Pickett  . Bilateral lower leg cellulitis   . Sleep apnea     uses oxygen 2L via [place] cannot tolerate CPAP - sleep study 2008 AHI 48.80/hr and 56.70hr during REM  . Complication of anesthesia     due to sleep apnea pt has had difficulty being put under anesthesia  . PONV (postoperative nausea and vomiting)   . Deep venous insufficiency   . Chronic diastolic HF (heart failure) ([place])   . SIRS (systemic inflammatory response syndrome) ([place]) 02/27/2012    . History of colonic polyps     Past Surgical History  Procedure Laterality Date  . Knee arthroscopy    . Rotator cuff repair    . Cholecystectomy    . Lumbar spine surgery    . Small bowel capsule study  07/2011    ?transient focal ischemia in distal small bowel to explain GI bleeding  . Esophagogastroduodenoscopy  Aug 2013    [facility], single 36mm pedunculated polyp otherwise normal. HYPERPLASTIC, NEGATIVE h.pylori  . Cardiac catheterization  02/27/2007    no sign of CAD, LVH, mod pulm HTN (Dr. Jackie Plum(  . Transurethral resection of prostate    . Transurethral resection of prostate N/A 06/18/2013    Procedure: TRANSURETHRAL RESECTION OF THE PROSTATE (TURP);  Surgeon: Marissa Nestle, MD;  Location: [facility] ORS;  Service: Urology;  Laterality: N/A;  . Transthoracic echocardiogram  09/2008    EF 60-65%, mod conc LVH  . Nm myocar perf wall motion  12/2006    dipyridamole myoview - stress images show mild perfusion defect in mid inferior walls with reversibility at rest; mild perfusion defect in mid inferolateral wall at stress with mild defect reversibility, EF 62%, abnormal but low risk study   . Esophagogastroduodenoscopy N/A 05/28/2014    RMR:non-critical Schatzki's ring/HH. Benign gastritis  . Azzie Almas  dilation N/A 05/28/2014    Procedure: SAVORY DILATION;  Surgeon: Daneil Dolin, MD;  Location: [facility] END[facility] SU[facility];  Service: Endoscopy;  Laterality: N/A;  Venia Minks dilation N/A 05/28/2014    Procedure: Venia Minks DILATION;  Surgeon: Daneil Dolin, MD;  Location: [facility];  Service: Endoscopy;  Laterality: N/A;  . Colonoscopy N/A 05/28/2014    [id number] colonic polyps/colonic diverticulosis. Tubular adenomas. Next colonoscopy January 2021    Prior to Admission medications   Medication Sig Start Date End Date Taking? Authorizing Provider  cyclobenzaprine (FLEXERIL) 10 MG tablet Take 1 tablet (10 mg total) by mouth 3 (three) times daily as needed for muscle spasms. 08/31/12  Yes Ashok Pall, MD   dorzolamide (TRUSOPT) 2 % ophthalmic solution 1 drop 2 (two) times daily.   Yes Historical Provider, MD  dorzolamide-timolol (COSOPT) 22.3-6.8 MG/ML ophthalmic solution  12/17/14 12/17/15 Yes Historical Provider, MD  Ginger, Zingiber officinalis, (GINGER ROOT PO) Take by mouth.   Yes Historical Provider, MD  IRON PO Take 1 tablet by mouth daily.   Yes Historical Provider, MD  latanoprost (XALATAN) 0.005 % ophthalmic solution Place 1 drop into both eyes at bedtime.   Yes Historical Provider, MD  metoprolol succinate (TOPROL-XL) 25 MG 24 hr tablet Take 25 mg by mouth daily.   Yes Historical Provider, MD  NON FORMULARY Oxycodone  10 mg  Takes one tablet twice daily   Yes Historical Provider, MD  NON FORMULARY Citracal   Calcium plus D3    Takes one tablet daily   Yes Historical Provider, MD  pantoprazole (PROTONIX) 40 MG tablet Take 40 mg by mouth 2 (two) times daily.   Yes Historical Provider, MD  Polyethyl Glycol-Propyl Glycol (LUBRICANT EYE DROPS) 0.4-0.3 % SOLN Apply 1 drop to eye daily as needed (for lubricant eye drops).   Yes Historical Provider, MD  potassium chloride (KLOR-CON) 20 MEQ packet Take 20 mEq by mouth 2 (two) times daily. Patient taking differently: Take 20 mEq by mouth 2 (two) times daily. Takes one every other day 11/14/13  Yes Arnoldo Lenis, MD  timolol (BETIMOL) 0.5 % ophthalmic solution Place 1 drop into the left eye 2 (two) times daily.   Yes Historical Provider, MD  torsemide (DEMADEX) 20 MG tablet Take 2 tablets (40 mg total) by mouth 2 (two) times daily. Resume on 10/19 Patient taking differently: Take 20 mg by mouth 2 (two) times daily. Resume on 10/19    Pt said takes one tablet twice daily 03/08/15  Yes Kathie Dike, MD  acetaminophen (TYLENOL) 325 MG tablet Take 325-650 mg by mouth every 6 (six) hours as needed for pain. Reported on 09/11/2015    Historical Provider, MD  docusate sodium (COLACE) 100 MG capsule Take 100 mg by mouth daily as needed for mild  constipation. Reported on 09/11/2015    Historical Provider, MD  Ferrous Sulfate (IRON) 90 (18 Fe) MG TABS Take by mouth. Reported on 09/11/2015    Historical Provider, MD  ondansetron (ZOFRAN) 4 MG tablet Take 4 mg by mouth every 8 (eight) hours as needed for nausea or vomiting. Reported on 09/11/2015    Historical Provider, MD  oxyCODONE-acetaminophen (PERCOCET) 10-325 MG tablet Take 1 tablet by mouth every 6 (six) hours as needed for pain. Patient not taking: Reported on 09/11/2015 03/08/15   Kathie Dike, MD    Allergies as of 09/11/2015 - Review Complete 09/11/2015  Allergen Reaction Noted  . Aspirin Other (See Comments)   . Nexium [esomeprazole magnesium] Other (See  Comments) 11/14/2013    Family History  Problem Relation Age of Onset  . Colon cancer Father 54    deceased  . Prostate cancer Father   . Pancreatic cancer Mother 25    deceased  . Prostate cancer Brother   . Arthritis Son   . Arthritis Son   . Diabetes Mellitus II Son     Social History   Social History  . Marital Status: Widowed    Spouse Name: Roger Kill  . Number of Children: 5  . Years of Education: College   Occupational History  . disability    Social History Main Topics  . Smoking status: Former Smoker -- 8 years    Types: Cigars    Start date: 11/20/1959    Quit date: 08/10/1973  . Smokeless tobacco: Never Used     Comment: Quit x 50 yrs  . Alcohol Use: No  . Drug Use: No  . Sexual Activity: No   Other Topics Concern  . Not on file   Social History Narrative   Patient lives at home with spouse and son.   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        Review of Systems: See HPI, otherwise negative ROS  Physical Exam: BP 158/96 mmHg  Pulse 77  Temp(Src) 96.7 F (35.9 C) (Oral)  Ht 5\' 6"  (1.676 m)  Wt 277 lb 6.4 oz (125.828 kg)  BMI 44.79 kg/m2 General:   Alert,  Well-developed, well-nourished, pleasant and cooperative in NAD Skin:  Intact without  significant lesions or rashes. Eyes:  Sclera clear, no icterus.   Conjunctiva pink. Bilateral xanthelasma present Ears:  Normal auditory acuity. Nose:  No deformity, discharge,  or lesions. Mouth:  No deformity or lesions. Neck:  Supple; no masses or thyromegaly. No significant cervical adenopathy. Lungs:  Clear throughout to auscultation.   No wheezes, crackles, or rhonchi. No acute distress. Heart:  Regular rate and rhythm; no murmurs, clicks, rubs,  or gallops. Abdomen: Non-distended, normal bowel sounds.  Soft and nontender without appreciable mass or hepatosplenomegaly.  Pulses:  Normal pulses noted. Extremities:  Without clubbing or edema.  Impression:    Patient has done very well from the standpoint his reflux taking with twice a day PPI therapy. Early morning nausea continues. Persistent problem well controlled with the Zofran. His insurance company no longer provide a benefit for Zofran. This is best bet for symptomatic control of nausea. This will be better than other agents such as Compazine or Phenergan. He was commended on his weight loss. Constipation well managed on Colace. Dysphagia resolved  -  status post Maloney dilation Schatzki's ring.   Recommendations: Continue to lose additional weight as feasible  Continue Protonix 40 mg twice daily  Continue Colace/fiber for constipation  I recommend you continue Zofran one 4 mg tablet each morning as needed for nausea  As discussed, Will go to bat for patient and check on insurance company and try to re-establish a benefit from this medication. This is the best/safest   Medication for nausea.  Will plan for an office visit in 3 months. We'll be back in touch with you next week after we check with your insurance company.    Notice: This dictation was prepared with Dragon dictation along with smaller phrase technology. Any transcriptional errors that result from this process are unintentional and may not be corrected upon  review.

## 2015-09-11 NOTE — Patient Instructions (Signed)
Good job on weight loss-keep it up!  Continue Protonix 40 mg twice daily  Continue Colace as needed for constipation  I recommend you continue Zofran one 4 mg tablet each morning as needed for nausea  As discussed, we will go to bat for you and check with your insurance company and try to reestablish a benefit from this medication. This is the best/safest   Medication for nausea.  Will plan for an office visit in 3 months. We'll be back in touch with you next week after we check with your insurance company.

## 2015-09-15 DIAGNOSIS — H2512 Age-related nuclear cataract, left eye: Secondary | ICD-10-CM | POA: Diagnosis not present

## 2015-09-15 DIAGNOSIS — Z961 Presence of intraocular lens: Secondary | ICD-10-CM | POA: Diagnosis not present

## 2015-09-15 DIAGNOSIS — H35033 Hypertensive retinopathy, bilateral: Secondary | ICD-10-CM | POA: Diagnosis not present

## 2015-09-15 DIAGNOSIS — H401133 Primary open-angle glaucoma, bilateral, severe stage: Secondary | ICD-10-CM | POA: Diagnosis not present

## 2015-09-16 ENCOUNTER — Encounter: Payer: Self-pay | Admitting: Internal Medicine

## 2015-09-23 ENCOUNTER — Ambulatory Visit (HOSPITAL_COMMUNITY)
Admission: RE | Admit: 2015-09-23 | Discharge: 2015-09-23 | Disposition: A | Discharge status: Home / Self Care | Payer: Medicare Other | Source: Ambulatory Visit | Attending: Medical | Admitting: Medical

## 2015-09-23 DIAGNOSIS — N183 Chronic kidney disease, stage 3 unspecified: Secondary | ICD-10-CM

## 2015-09-23 DIAGNOSIS — H401133 Primary open-angle glaucoma, bilateral, severe stage: Secondary | ICD-10-CM | POA: Diagnosis not present

## 2015-09-23 DIAGNOSIS — N281 Cyst of kidney, acquired: Secondary | ICD-10-CM | POA: Insufficient documentation

## 2015-09-23 DIAGNOSIS — H2512 Age-related nuclear cataract, left eye: Secondary | ICD-10-CM | POA: Diagnosis not present

## 2015-09-23 DIAGNOSIS — Z961 Presence of intraocular lens: Secondary | ICD-10-CM | POA: Diagnosis not present

## 2015-09-24 ENCOUNTER — Other Ambulatory Visit: Payer: Self-pay | Admitting: Otolaryngology

## 2015-09-24 ENCOUNTER — Telehealth: Payer: Self-pay

## 2015-09-24 NOTE — Telephone Encounter (Signed)
Tried to do a PA for the pts odansetron.  PA has been denied because the FDA says odansetron is not a treatment for nausea and vomiting.  Pt will need an appeal letter.

## 2015-09-25 ENCOUNTER — Encounter: Payer: Self-pay | Admitting: Gastroenterology

## 2015-09-25 NOTE — Telephone Encounter (Signed)
Completed.

## 2015-09-25 NOTE — Telephone Encounter (Signed)
Letter faxed to the insurance co.

## 2015-09-25 NOTE — Pre-Procedure Instructions (Addendum)
[name] [name]  09/25/2015      [facility] [address] [place] [address] Phone: [phone] Fax: [phone]    Your procedure is scheduled on 10/07/15  Report to [facility] Admitting at 630 A.M.  Call this number if you have problems the morning of surgery:  [phone]   Remember:  Do not eat food or drink liquids after midnight.  Take these medicines the morning of surgery with A SIP OF WATER metoprolol,protonix,pain med if needed, eye drops  STOP all herbel meds, nsaids (aleve,naproxen,advil,ibuprofen) 5 days prior to surgery starting 10/02/15 including ginger, cal+D, aspirin,   Do not wear jewelry, make-up or nail polish.  Do not wear lotions, powders, or perfumes.  You may wear deodorant.  Do not shave 48 hours prior to surgery.  Men may shave face and neck.  Do not bring valuables to the hospital.  [facility] is not responsible for any belongings or valuables.  Contacts, dentures or bridgework may not be worn into surgery.  Leave your suitcase in the car.  After surgery it may be brought to your room.  For patients admitted to the hospital, discharge time will be determined by your treatment team.  Patients discharged the day of surgery will not be allowed to drive home.   Name and phone number of your driver:    Special instructions:  Special Instructions: [place] - Preparing for Surgery  Before surgery, you can play an important role.  Because skin is not sterile, your skin needs to be as free of germs as possible.  You can reduce the number of germs on you skin by washing with CHG (chlorahexidine gluconate) soap before surgery.  CHG is an antiseptic cleaner which kills germs and bonds with the skin to continue killing germs even after washing.  Please DO NOT use if you have an allergy to CHG or antibacterial soaps.  If your skin becomes reddened/irritated stop using the CHG and inform your nurse when you  arrive at Short Stay.  Do not shave (including legs and underarms) for at least 48 hours prior to the first CHG shower.  You may shave your face.  Please follow these instructions carefully:   1.  Shower with CHG Soap the night before surgery and the morning of Surgery.  2.  If you choose to wash your hair, wash your hair first as usual with your normal shampoo.  3.  After you shampoo, rinse your hair and body thoroughly to remove the Shampoo.  4.  Use CHG as you would any other liquid soap.  You can apply chg directly  to the skin and wash gently with scrungie or a clean washcloth.  5.  Apply the CHG Soap to your body ONLY FROM THE NECK DOWN.  Do not use on open wounds or open sores.  Avoid contact with your eyes ears, mouth and genitals (private parts).  Wash genitals (private parts)       with your normal soap.  6.  Wash thoroughly, paying special attention to the area where your surgery will be performed.  7.  Thoroughly rinse your body with warm water from the neck down.  8.  DO NOT shower/wash with your normal soap after using and rinsing off the CHG Soap.  9.  Pat yourself dry with a clean towel.            10.  Wear clean  pajamas.            11.  Place clean sheets on your bed the night of your first shower and do not sleep with pets.  Day of Surgery  Do not apply any lotions/deodorants the morning of surgery.  Please wear clean clothes to the hospital/surgery center.  Please read over the following fact sheets that you were given. Pain Booklet, Coughing and Deep Breathing and Surgical Site Infection Prevention

## 2015-09-28 ENCOUNTER — Encounter (HOSPITAL_COMMUNITY)
Admission: RE | Admit: 2015-09-28 | Discharge: 2015-09-28 | Disposition: A | Discharge status: Home / Self Care | Payer: Medicare Other | Source: Ambulatory Visit | Attending: Otolaryngology | Admitting: Otolaryngology

## 2015-09-28 ENCOUNTER — Encounter (HOSPITAL_COMMUNITY): Payer: Self-pay

## 2015-09-28 DIAGNOSIS — K219 Gastro-esophageal reflux disease without esophagitis: Secondary | ICD-10-CM | POA: Insufficient documentation

## 2015-09-28 DIAGNOSIS — E785 Hyperlipidemia, unspecified: Secondary | ICD-10-CM | POA: Insufficient documentation

## 2015-09-28 DIAGNOSIS — J342 Deviated nasal septum: Secondary | ICD-10-CM | POA: Diagnosis not present

## 2015-09-28 DIAGNOSIS — I509 Heart failure, unspecified: Secondary | ICD-10-CM | POA: Insufficient documentation

## 2015-09-28 DIAGNOSIS — H409 Unspecified glaucoma: Secondary | ICD-10-CM | POA: Diagnosis not present

## 2015-09-28 DIAGNOSIS — I110 Hypertensive heart disease with heart failure: Secondary | ICD-10-CM | POA: Diagnosis not present

## 2015-09-28 DIAGNOSIS — Z981 Arthrodesis status: Secondary | ICD-10-CM | POA: Insufficient documentation

## 2015-09-28 DIAGNOSIS — Z01818 Encounter for other preprocedural examination: Secondary | ICD-10-CM | POA: Diagnosis not present

## 2015-09-28 DIAGNOSIS — Z01812 Encounter for preprocedural laboratory examination: Secondary | ICD-10-CM | POA: Insufficient documentation

## 2015-09-28 DIAGNOSIS — E039 Hypothyroidism, unspecified: Secondary | ICD-10-CM | POA: Diagnosis not present

## 2015-09-28 DIAGNOSIS — G4733 Obstructive sleep apnea (adult) (pediatric): Secondary | ICD-10-CM | POA: Insufficient documentation

## 2015-09-28 DIAGNOSIS — J343 Hypertrophy of nasal turbinates: Secondary | ICD-10-CM | POA: Insufficient documentation

## 2015-09-28 DIAGNOSIS — Z79899 Other long term (current) drug therapy: Secondary | ICD-10-CM | POA: Insufficient documentation

## 2015-09-28 DIAGNOSIS — Z87891 Personal history of nicotine dependence: Secondary | ICD-10-CM | POA: Insufficient documentation

## 2015-09-28 HISTORY — DX: Headache: R51

## 2015-09-28 HISTORY — DX: Unspecified cataract: H26.9

## 2015-09-28 HISTORY — DX: Unspecified glaucoma: H40.9

## 2015-09-28 HISTORY — DX: Headache, unspecified: R51.9

## 2015-09-28 LAB — BASIC METABOLIC PANEL
Anion gap: 7 (ref 5–15)
BUN: 12 mg/dL (ref 6–20)
CHLORIDE: 103 mmol/L (ref 101–111)
CO2: 29 mmol/L (ref 22–32)
Calcium: 9.4 mg/dL (ref 8.9–10.3)
Creatinine, Ser: 0.99 mg/dL (ref 0.61–1.24)
GFR calc Af Amer: >60 mL/min (ref >60)
GFR calc non Af Amer: >60 mL/min (ref >60)
GLUCOSE: 112 mg/dL — AB (ref 65–99)
POTASSIUM: 4.6 mmol/L (ref 3.5–5.1)
Sodium: 139 mmol/L (ref 135–145)

## 2015-09-28 LAB — CBC
HEMATOCRIT: 48.5 % (ref 39.0–52.0)
Hemoglobin: 15.7 g/dL (ref 13.0–17.0)
MCH: 26.3 pg (ref 26.0–34.0)
MCHC: 32.4 g/dL (ref 30.0–36.0)
MCV: 81.1 fL (ref 78.0–100.0)
Platelets: 110 10*3/uL — ABNORMAL LOW (ref 150–400)
RBC: 5.98 MIL/uL — ABNORMAL HIGH (ref 4.22–5.81)
RDW: 17.8 % — AB (ref 11.5–15.5)
WBC: 14.6 10*3/uL — ABNORMAL HIGH (ref 4.0–10.5)

## 2015-09-29 NOTE — Progress Notes (Signed)
Anesthesia Chart Review:  Pt is a 75 year old male scheduled for B nasal septoplasty with turbinate reduction on 10/07/2015 with Dr. Benjamine Mola.   PCP is Dr. Redmond School. Cardiologist is Dr. Carlyle Dolly.   PMH includes:  CHF, HTN, hyperlipidemia, OSA, thrombocytopenia, glaucoma, hypothyroidism, post-op N/V, GERD. Former smoker. BMI 45. S/p TURP 06/18/13. S/p posterior lumbar fusion 08/22/12.   Anesthesia history: Pt reports difficulty going under and waking up from anesthesia during surgery in 2012. Anesthesia records in [place] from 2014 and 2015 indicate pt had no problems with anesthesia perioperatively.   Medications include: cosopt ophthalmic, metoprolol, protonix, potassium, torsemide.   Preoperative labs reviewed.   - WBC 14.6. I spoke with pt by telephone- he denies any acute illness symptoms. I routed labs to Dr. Benjamine Mola for review. Will defer to him if he wants to repeat CBC DOS.   EKG will need to be obtained DOS.   Carotid duplex 06/02/14: Less than 50% stenosis in the right and left internal carotid arteries.  Echo 05/04/13:  - Procedure narrative: Transthoracic echocardiography. Technically difficult study with reduced echocardiographic windows. - Left ventricle: The cavity size was normal. There was moderate concentric hypertrophy. Systolic function was vigorous. The estimated ejection fraction was in the range of 65% to 70%. Wall motion was normal; there were no regional wall motion abnormalities. Doppler parameters are consistent with abnormal left ventricular relaxation (grade 1 diastolic dysfunction). The E/e' ratio is <10, suggesting normal LV filling pressure. - Left atrium: The atrium was normal in size. - Tricuspid valve: Mild regurgitation. - Pericardium, extracardiac: There was no pericardial effusion.  Cardiac cath 02/27/07:  1. No significant CAD. LVH noted on left ventriculogram.  2. Moderate pulmonary HTN with preserved cardiac output and cardiac index.   If no  changes, I anticipate pt can proceed with surgery as scheduled.   Willeen Cass, FNP-BC [facility] Short Stay Surgical Center/Anesthesiology Phone: [phone] 09/29/2015 2:11 PM

## 2015-09-30 DIAGNOSIS — M47817 Spondylosis without myelopathy or radiculopathy, lumbosacral region: Secondary | ICD-10-CM | POA: Diagnosis not present

## 2015-09-30 DIAGNOSIS — M25561 Pain in right knee: Secondary | ICD-10-CM | POA: Diagnosis not present

## 2015-09-30 DIAGNOSIS — M545 Low back pain: Secondary | ICD-10-CM | POA: Diagnosis not present

## 2015-09-30 DIAGNOSIS — G894 Chronic pain syndrome: Secondary | ICD-10-CM | POA: Diagnosis not present

## 2015-09-30 DIAGNOSIS — M961 Postlaminectomy syndrome, not elsewhere classified: Secondary | ICD-10-CM | POA: Diagnosis not present

## 2015-09-30 DIAGNOSIS — M170 Bilateral primary osteoarthritis of knee: Secondary | ICD-10-CM | POA: Diagnosis not present

## 2015-10-05 ENCOUNTER — Other Ambulatory Visit: Payer: Self-pay | Admitting: Otolaryngology

## 2015-10-06 DIAGNOSIS — D509 Iron deficiency anemia, unspecified: Secondary | ICD-10-CM | POA: Diagnosis not present

## 2015-10-06 DIAGNOSIS — Z79899 Other long term (current) drug therapy: Secondary | ICD-10-CM | POA: Diagnosis not present

## 2015-10-06 DIAGNOSIS — R809 Proteinuria, unspecified: Secondary | ICD-10-CM | POA: Diagnosis not present

## 2015-10-06 DIAGNOSIS — I10 Essential (primary) hypertension: Secondary | ICD-10-CM | POA: Diagnosis not present

## 2015-10-06 DIAGNOSIS — N183 Chronic kidney disease, stage 3 (moderate): Secondary | ICD-10-CM | POA: Diagnosis not present

## 2015-10-06 DIAGNOSIS — E559 Vitamin D deficiency, unspecified: Secondary | ICD-10-CM | POA: Diagnosis not present

## 2015-10-06 NOTE — Anesthesia Preprocedure Evaluation (Addendum)
Anesthesia Evaluation  Patient identified by MRN, date of birth, ID band Patient awake    Reviewed: Allergy & Precautions, NPO status , Patient's Chart, lab work & pertinent test results, reviewed documented beta blocker date and time   History of Anesthesia Complications (+) PONV  Airway Mallampati: III  TM Distance: >3 FB Neck ROM: Full    Dental  (+) Teeth Intact, Dental Advisory Given   Pulmonary sleep apnea , former smoker,    breath sounds clear to auscultation       Cardiovascular hypertension, Pt. on medications and Pt. on home beta blockers + CAD, + Peripheral Vascular Disease and +CHF   Rhythm:Regular Rate:Normal     Neuro/Psych  Headaches, negative psych ROS   GI/Hepatic PUD, GERD  Medicated,  Endo/Other  Hypothyroidism   Renal/GU Renal disease  negative genitourinary   Musculoskeletal  (+) Arthritis ,   Abdominal   Peds negative pediatric ROS (+)  Hematology   Anesthesia Other Findings - HLD  Reproductive/Obstetrics                            Lab Results  Component Value Date   WBC 14.6* 09/28/2015   HGB 15.7 09/28/2015   HCT 48.5 09/28/2015   MCV 81.1 09/28/2015   PLT 110* 09/28/2015   Lab Results  Component Value Date   CREATININE 0.99 09/28/2015   BUN 12 09/28/2015   NA 139 09/28/2015   K 4.6 09/28/2015   CL 103 09/28/2015   CO2 29 09/28/2015   Lab Results  Component Value Date   INR 1.00 08/20/2013   INR 0.97 05/03/2013   INR 0.97 07/23/2010     Anesthesia Physical Anesthesia Plan  ASA: III  Anesthesia Plan: General   Post-op Pain Management:    Induction: Intravenous  Airway Management Planned: Oral ETT  Additional Equipment:   Intra-op Plan:   Post-operative Plan: Extubation in OR  Informed Consent: I have reviewed the patients History and Physical, chart, labs and discussed the procedure including the risks, benefits and alternatives  for the proposed anesthesia with the patient or authorized representative who has indicated his/her understanding and acceptance.   Dental advisory given  Plan Discussed with: CRNA  Anesthesia Plan Comments:         Anesthesia Quick Evaluation

## 2015-10-07 ENCOUNTER — Encounter (HOSPITAL_COMMUNITY)
Admission: RE | Disposition: A | Discharge status: Home / Self Care | Payer: Self-pay | Source: Ambulatory Visit | Attending: Otolaryngology

## 2015-10-07 ENCOUNTER — Ambulatory Visit (HOSPITAL_COMMUNITY)
Admission: RE | Admit: 2015-10-07 | Discharge: 2015-10-08 | Disposition: A | Discharge status: Home / Self Care | Payer: Medicare Other | Source: Ambulatory Visit | Attending: Otolaryngology | Admitting: Otolaryngology

## 2015-10-07 ENCOUNTER — Ambulatory Visit (HOSPITAL_COMMUNITY): Payer: Medicare Other | Admitting: Emergency Medicine

## 2015-10-07 ENCOUNTER — Encounter (HOSPITAL_COMMUNITY): Payer: Self-pay | Admitting: Certified Registered Nurse Anesthetist

## 2015-10-07 ENCOUNTER — Ambulatory Visit (HOSPITAL_COMMUNITY): Payer: Medicare Other | Admitting: Anesthesiology

## 2015-10-07 DIAGNOSIS — I2510 Atherosclerotic heart disease of native coronary artery without angina pectoris: Secondary | ICD-10-CM | POA: Insufficient documentation

## 2015-10-07 DIAGNOSIS — J342 Deviated nasal septum: Secondary | ICD-10-CM | POA: Insufficient documentation

## 2015-10-07 DIAGNOSIS — I739 Peripheral vascular disease, unspecified: Secondary | ICD-10-CM | POA: Insufficient documentation

## 2015-10-07 DIAGNOSIS — K219 Gastro-esophageal reflux disease without esophagitis: Secondary | ICD-10-CM | POA: Insufficient documentation

## 2015-10-07 DIAGNOSIS — G4730 Sleep apnea, unspecified: Secondary | ICD-10-CM | POA: Insufficient documentation

## 2015-10-07 DIAGNOSIS — Z87891 Personal history of nicotine dependence: Secondary | ICD-10-CM | POA: Diagnosis not present

## 2015-10-07 DIAGNOSIS — I110 Hypertensive heart disease with heart failure: Secondary | ICD-10-CM | POA: Insufficient documentation

## 2015-10-07 DIAGNOSIS — J3489 Other specified disorders of nose and nasal sinuses: Secondary | ICD-10-CM | POA: Insufficient documentation

## 2015-10-07 DIAGNOSIS — I252 Old myocardial infarction: Secondary | ICD-10-CM | POA: Insufficient documentation

## 2015-10-07 DIAGNOSIS — I509 Heart failure, unspecified: Secondary | ICD-10-CM | POA: Diagnosis not present

## 2015-10-07 DIAGNOSIS — J343 Hypertrophy of nasal turbinates: Secondary | ICD-10-CM | POA: Insufficient documentation

## 2015-10-07 DIAGNOSIS — Z9889 Other specified postprocedural states: Secondary | ICD-10-CM

## 2015-10-07 DIAGNOSIS — M1990 Unspecified osteoarthritis, unspecified site: Secondary | ICD-10-CM | POA: Insufficient documentation

## 2015-10-07 HISTORY — PX: NASAL SEPTOPLASTY W/ TURBINOPLASTY: SHX2070

## 2015-10-07 HISTORY — PX: NASAL SEPTUM SURGERY: SHX37

## 2015-10-07 HISTORY — DX: Calculus of kidney: N20.0

## 2015-10-07 SURGERY — SEPTOPLASTY, NOSE, WITH NASAL TURBINATE REDUCTION
Anesthesia: General | Site: Nose | Laterality: Bilateral

## 2015-10-07 MED ORDER — ROCURONIUM BROMIDE 100 MG/10ML IV SOLN
INTRAVENOUS | Status: DC | PRN
  Administered 2015-10-07: 50 mg via INTRAVENOUS

## 2015-10-07 MED ORDER — METOPROLOL SUCCINATE ER 25 MG PO TB24
25.0000 mg | ORAL_TABLET | Freq: Every day | ORAL | Status: DC
  Administered 2015-10-08: 25 mg via ORAL
  Filled 2015-10-07: qty 1

## 2015-10-07 MED ORDER — ARTIFICIAL TEARS OP OINT
TOPICAL_OINTMENT | OPHTHALMIC | Status: AC
  Filled 2015-10-07: qty 3.5

## 2015-10-07 MED ORDER — POTASSIUM CHLORIDE 20 MEQ PO PACK
20.0000 meq | PACK | ORAL | Status: DC
  Administered 2015-10-08: 20 meq via ORAL
  Filled 2015-10-07: qty 1

## 2015-10-07 MED ORDER — SUGAMMADEX SODIUM 200 MG/2ML IV SOLN
INTRAVENOUS | Status: DC | PRN
  Administered 2015-10-07: 200 mg via INTRAVENOUS

## 2015-10-07 MED ORDER — ONDANSETRON HCL 4 MG/2ML IJ SOLN: 4.0000 mg | INTRAMUSCULAR | Status: DC | PRN

## 2015-10-07 MED ORDER — LATANOPROST 0.005 % OP SOLN
1.0000 [drp] | Freq: Every day | OPHTHALMIC | Status: DC
  Administered 2015-10-07: 1 [drp] via OPHTHALMIC
  Filled 2015-10-07: qty 2.5

## 2015-10-07 MED ORDER — BACITRACIN ZINC 500 UNIT/GM EX OINT
TOPICAL_OINTMENT | CUTANEOUS | Status: DC | PRN
  Administered 2015-10-07: 1 via TOPICAL

## 2015-10-07 MED ORDER — SUCCINYLCHOLINE CHLORIDE 200 MG/10ML IV SOSY
PREFILLED_SYRINGE | INTRAVENOUS | Status: AC
  Filled 2015-10-07: qty 10

## 2015-10-07 MED ORDER — DORZOLAMIDE HCL-TIMOLOL MAL 2-0.5 % OP SOLN
1.0000 [drp] | Freq: Two times a day (BID) | OPHTHALMIC | Status: DC
  Filled 2015-10-07: qty 10

## 2015-10-07 MED ORDER — PROPOFOL 1000 MG/100ML IV EMUL
INTRAVENOUS | Status: AC
  Filled 2015-10-07: qty 100

## 2015-10-07 MED ORDER — OXYMETAZOLINE HCL 0.05 % NA SOLN
NASAL | Status: DC | PRN
Start: 2015-10-07 — End: 2015-10-07
  Administered 2015-10-07: 1 via TOPICAL

## 2015-10-07 MED ORDER — LIDOCAINE-EPINEPHRINE 1 %-1:100000 IJ SOLN
INTRAMUSCULAR | Status: DC | PRN
  Administered 2015-10-07: 20 mL via INTRADERMAL

## 2015-10-07 MED ORDER — FENTANYL CITRATE (PF) 100 MCG/2ML IJ SOLN
INTRAMUSCULAR | Status: AC
  Filled 2015-10-07: qty 2

## 2015-10-07 MED ORDER — ONDANSETRON HCL 4 MG/2ML IJ SOLN
INTRAMUSCULAR | Status: AC
  Filled 2015-10-07: qty 2

## 2015-10-07 MED ORDER — PROPOFOL 10 MG/ML IV BOLUS
INTRAVENOUS | Status: DC | PRN
  Administered 2015-10-07: 150 mg via INTRAVENOUS
  Administered 2015-10-07: 50 mg via INTRAVENOUS

## 2015-10-07 MED ORDER — 0.9 % SODIUM CHLORIDE (POUR BTL) OPTIME
TOPICAL | Status: DC | PRN
  Administered 2015-10-07: 1000 mL

## 2015-10-07 MED ORDER — LIDOCAINE-EPINEPHRINE 1 %-1:100000 IJ SOLN
INTRAMUSCULAR | Status: AC
  Filled 2015-10-07: qty 1

## 2015-10-07 MED ORDER — ONDANSETRON HCL 4 MG/2ML IJ SOLN
INTRAMUSCULAR | Status: DC | PRN
  Administered 2015-10-07: 4 mg via INTRAVENOUS

## 2015-10-07 MED ORDER — POLYVINYL ALCOHOL 1.4 % OP SOLN
1.0000 [drp] | Freq: Every day | OPHTHALMIC | Status: DC | PRN
  Filled 2015-10-07: qty 15

## 2015-10-07 MED ORDER — KCL IN DEXTROSE-NACL 20-5-0.45 MEQ/L-%-% IV SOLN
INTRAVENOUS | Status: DC
  Administered 2015-10-07: 13:00:00 via INTRAVENOUS
  Filled 2015-10-07: qty 1000

## 2015-10-07 MED ORDER — PROPOFOL 10 MG/ML IV BOLUS
INTRAVENOUS | Status: AC
  Filled 2015-10-07: qty 20

## 2015-10-07 MED ORDER — CEFAZOLIN SODIUM 1 G IJ SOLR
INTRAMUSCULAR | Status: DC | PRN
  Administered 2015-10-07: 3 g via INTRAMUSCULAR

## 2015-10-07 MED ORDER — LIDOCAINE 2% (20 MG/ML) 5 ML SYRINGE
INTRAMUSCULAR | Status: AC
  Filled 2015-10-07: qty 5

## 2015-10-07 MED ORDER — ROCURONIUM BROMIDE 50 MG/5ML IV SOLN
INTRAVENOUS | Status: AC
  Filled 2015-10-07: qty 1

## 2015-10-07 MED ORDER — OXYCODONE-ACETAMINOPHEN 5-325 MG PO TABS: 1.0000 | ORAL_TABLET | ORAL | Status: DC | PRN

## 2015-10-07 MED ORDER — ARTIFICIAL TEARS OP OINT
TOPICAL_OINTMENT | OPHTHALMIC | Status: DC | PRN
  Administered 2015-10-07: 1 via OPHTHALMIC

## 2015-10-07 MED ORDER — OXYCODONE-ACETAMINOPHEN 5-325 MG PO TABS
ORAL_TABLET | ORAL | Status: AC
  Administered 2015-10-07: 2 via ORAL
  Filled 2015-10-07: qty 2

## 2015-10-07 MED ORDER — CALCIUM CITRATE 950 (200 CA) MG PO TABS
200.0000 mg | ORAL_TABLET | Freq: Every day | ORAL | Status: DC
  Administered 2015-10-07 – 2015-10-08 (×2): 200 mg via ORAL
  Filled 2015-10-07 (×3): qty 1

## 2015-10-07 MED ORDER — PROCHLORPERAZINE EDISYLATE 5 MG/ML IJ SOLN: 10.0000 mg | INTRAMUSCULAR | Status: DC | PRN

## 2015-10-07 MED ORDER — MENTHOL 3 MG MT LOZG
LOZENGE | OROMUCOSAL | Status: AC
  Administered 2015-10-07: 12:00:00
  Filled 2015-10-07: qty 9

## 2015-10-07 MED ORDER — GLYCOPYRROLATE 0.2 MG/ML IV SOSY
PREFILLED_SYRINGE | INTRAVENOUS | Status: AC
  Filled 2015-10-07: qty 3

## 2015-10-07 MED ORDER — LACTATED RINGERS IV SOLN: INTRAVENOUS | Status: DC

## 2015-10-07 MED ORDER — GLYCOPYRROLATE 0.2 MG/ML IJ SOLN
INTRAMUSCULAR | Status: DC | PRN
  Administered 2015-10-07 (×2): 0.1 mg via INTRAVENOUS

## 2015-10-07 MED ORDER — MORPHINE SULFATE (PF) 2 MG/ML IV SOLN
2.0000 mg | INTRAVENOUS | Status: DC | PRN
  Administered 2015-10-07 (×2): 4 mg via INTRAVENOUS
  Filled 2015-10-07 (×2): qty 2

## 2015-10-07 MED ORDER — BACITRACIN ZINC 500 UNIT/GM EX OINT
TOPICAL_OINTMENT | CUTANEOUS | Status: AC
  Filled 2015-10-07: qty 28.35

## 2015-10-07 MED ORDER — FENTANYL CITRATE (PF) 100 MCG/2ML IJ SOLN
25.0000 ug | INTRAMUSCULAR | Status: DC | PRN
  Administered 2015-10-07 (×2): 25 ug via INTRAVENOUS
  Administered 2015-10-07: 50 ug via INTRAVENOUS
  Administered 2015-10-07 (×2): 25 ug via INTRAVENOUS

## 2015-10-07 MED ORDER — FENTANYL CITRATE (PF) 250 MCG/5ML IJ SOLN
INTRAMUSCULAR | Status: AC
  Filled 2015-10-07: qty 5

## 2015-10-07 MED ORDER — ONDANSETRON HCL 4 MG PO TABS: 4.0000 mg | ORAL_TABLET | ORAL | Status: DC | PRN

## 2015-10-07 MED ORDER — FENTANYL CITRATE (PF) 250 MCG/5ML IJ SOLN
INTRAMUSCULAR | Status: DC | PRN
  Administered 2015-10-07: 50 ug via INTRAVENOUS
  Administered 2015-10-07: 25 ug via INTRAVENOUS
  Administered 2015-10-07: 50 ug via INTRAVENOUS
  Administered 2015-10-07: 25 ug via INTRAVENOUS

## 2015-10-07 MED ORDER — SUGAMMADEX SODIUM 200 MG/2ML IV SOLN
INTRAVENOUS | Status: AC
  Filled 2015-10-07: qty 2

## 2015-10-07 MED ORDER — BISACODYL 5 MG PO TBEC: 5.0000 mg | DELAYED_RELEASE_TABLET | Freq: Every day | ORAL | Status: DC | PRN

## 2015-10-07 MED ORDER — TORSEMIDE 20 MG PO TABS
40.0000 mg | ORAL_TABLET | Freq: Two times a day (BID) | ORAL | Status: DC
  Administered 2015-10-07 – 2015-10-08 (×2): 40 mg via ORAL
  Filled 2015-10-07 (×2): qty 2

## 2015-10-07 MED ORDER — LIDOCAINE HCL (CARDIAC) 20 MG/ML IV SOLN
INTRAVENOUS | Status: DC | PRN
  Administered 2015-10-07: 60 mg via INTRATRACHEAL

## 2015-10-07 MED ORDER — PANTOPRAZOLE SODIUM 40 MG PO TBEC
40.0000 mg | DELAYED_RELEASE_TABLET | Freq: Two times a day (BID) | ORAL | Status: DC
  Administered 2015-10-07 – 2015-10-08 (×2): 40 mg via ORAL
  Filled 2015-10-07 (×2): qty 1

## 2015-10-07 MED ORDER — AMOXICILLIN 875 MG PO TABS: 875.0000 mg | ORAL_TABLET | Freq: Two times a day (BID) | ORAL | Status: DC

## 2015-10-07 MED ORDER — FENTANYL CITRATE (PF) 100 MCG/2ML IJ SOLN
INTRAMUSCULAR | Status: AC
  Administered 2015-10-07: 25 ug via INTRAVENOUS
  Filled 2015-10-07: qty 2

## 2015-10-07 MED ORDER — PROPOFOL 500 MG/50ML IV EMUL
INTRAVENOUS | Status: DC | PRN
  Administered 2015-10-07: 25 ug/kg/min via INTRAVENOUS

## 2015-10-07 MED ORDER — OXYCODONE-ACETAMINOPHEN 5-325 MG PO TABS
1.0000 | ORAL_TABLET | ORAL | Status: DC | PRN
  Administered 2015-10-07 – 2015-10-08 (×3): 2 via ORAL
  Filled 2015-10-07 (×3): qty 2

## 2015-10-07 MED ORDER — PHENYLEPHRINE HCL 10 MG/ML IJ SOLN
10.0000 mg | INTRAVENOUS | Status: DC | PRN
  Administered 2015-10-07: 50 ug/min via INTRAVENOUS

## 2015-10-07 MED ORDER — MIDAZOLAM HCL 2 MG/2ML IJ SOLN
INTRAMUSCULAR | Status: AC
  Filled 2015-10-07: qty 2

## 2015-10-07 MED ORDER — SUCCINYLCHOLINE CHLORIDE 20 MG/ML IJ SOLN
INTRAMUSCULAR | Status: DC | PRN
  Administered 2015-10-07: 120 mg via INTRAVENOUS

## 2015-10-07 MED ORDER — MEPERIDINE HCL 25 MG/ML IJ SOLN: 6.2500 mg | INTRAMUSCULAR | Status: DC | PRN

## 2015-10-07 MED ORDER — OXYCODONE-ACETAMINOPHEN 5-325 MG PO TABS
1.0000 | ORAL_TABLET | Freq: Once | ORAL | Status: AC
  Administered 2015-10-07: 2 via ORAL

## 2015-10-07 MED ORDER — LACTATED RINGERS IV SOLN
INTRAVENOUS | Status: DC | PRN
  Administered 2015-10-07: 08:00:00 via INTRAVENOUS

## 2015-10-07 MED ORDER — OXYMETAZOLINE HCL 0.05 % NA SOLN
NASAL | Status: AC
  Filled 2015-10-07: qty 15

## 2015-10-07 SURGICAL SUPPLY — 32 items
BLADE SURG 15 STRL LF DISP TIS (BLADE) IMPLANT
BLADE SURG 15 STRL SS (BLADE)
CANISTER SUCTION 2500CC (MISCELLANEOUS) ×2 IMPLANT
COAGULATOR SUCT SWTCH 10FR 6 (ELECTROSURGICAL) ×2 IMPLANT
DRAPE PROXIMA HALF (DRAPES) IMPLANT
ELECT REM PT RETURN 9FT ADLT (ELECTROSURGICAL) ×2
ELECTRODE REM PT RTRN 9FT ADLT (ELECTROSURGICAL) ×1 IMPLANT
GAUZE SPONGE 2X2 8PLY STRL LF (GAUZE/BANDAGES/DRESSINGS) ×1 IMPLANT
GLOVE BIO SURGEON STRL SZ8 (GLOVE) ×1 IMPLANT
GLOVE BIOGEL PI IND STRL 8 (GLOVE) IMPLANT
GLOVE BIOGEL PI INDICATOR 8 (GLOVE) ×1
GLOVE ECLIPSE 7.5 STRL STRAW (GLOVE) ×2 IMPLANT
GOWN STRL REUS W/ TWL LRG LVL3 (GOWN DISPOSABLE) ×2 IMPLANT
GOWN STRL REUS W/ TWL XL LVL3 (GOWN DISPOSABLE) IMPLANT
GOWN STRL REUS W/TWL LRG LVL3 (GOWN DISPOSABLE) ×2
GOWN STRL REUS W/TWL XL LVL3 (GOWN DISPOSABLE) ×2
KIT BASIN OR (CUSTOM PROCEDURE TRAY) ×2 IMPLANT
KIT ROOM TURNOVER OR (KITS) ×2 IMPLANT
NEEDLE HYPO 25GX1X1/2 BEV (NEEDLE) ×2 IMPLANT
NS IRRIG 1000ML POUR BTL (IV SOLUTION) ×2 IMPLANT
PAD ARMBOARD 7.5X6 YLW CONV (MISCELLANEOUS) ×3 IMPLANT
SOLUTION ANTI FOG 6CC (MISCELLANEOUS) ×1 IMPLANT
SPLINT NASAL DOYLE BI-VL (GAUZE/BANDAGES/DRESSINGS) ×2 IMPLANT
SPONGE GAUZE 2X2 STER 10/PKG (GAUZE/BANDAGES/DRESSINGS) ×1
SPONGE NEURO XRAY DETECT 1X3 (DISPOSABLE) ×2 IMPLANT
SUT CHROMIC 3 0 SH 27 (SUTURE) ×2 IMPLANT
SUT CHROMIC 4 0 S 4 (SUTURE) ×2 IMPLANT
SUT PLAIN 4 0 ~~LOC~~ 1 (SUTURE) ×2 IMPLANT
SUT PROLENE 2 0 FS (SUTURE) ×2 IMPLANT
TRAY ENT MC OR (CUSTOM PROCEDURE TRAY) ×2 IMPLANT
TUBE SALEM SUMP 16 FR W/ARV (TUBING) IMPLANT
TUBING EXTENTION W/L.L. (IV SETS) IMPLANT

## 2015-10-07 NOTE — Anesthesia Postprocedure Evaluation (Signed)
Anesthesia Post Note  Patient: [name] [name]  Procedure(s) Performed: Procedure(s) (LRB): NASAL SEPTOPLASTY WITH TURBINATE REDUCTION (Bilateral)  Patient location during evaluation: PACU Anesthesia Type: General Level of consciousness: awake and alert Pain management: pain level controlled Vital Signs Assessment: post-procedure vital signs reviewed and stable Respiratory status: spontaneous breathing, nonlabored ventilation, respiratory function stable and patient connected to nasal cannula oxygen Cardiovascular status: blood pressure returned to baseline and stable Postop Assessment: no signs of nausea or vomiting Anesthetic complications: no    Last Vitals:  Filed Vitals:   10/07/15 1145 10/07/15 1225  BP: 144/90 155/79  Pulse: 86 91  Temp:  36.4 C  Resp: 14 16    Last Pain:  Filed Vitals:   10/07/15 1226  PainSc: Sharon Tunya Held

## 2015-10-07 NOTE — Progress Notes (Signed)
Pt says he does not have anyone to stay with him tonight @home .  Dr. Benjamine Mola notified. Will keep overnight for observation. Family & patient updated.

## 2015-10-07 NOTE — H&P (Signed)
Cc: Chronic nasal obstruction  HPI: The patient is a 75 y/o male who returns today for follow up evaluation of chronic nasal congestion. The patient was last seen 4 weeks ago. At that time, he was noted to have a septal deviation with bilateral inferior turbinate hypertrophy. The patient was placed on daily Flonase. He returns today noting no improvement in his congestion. The patient continues to note frequent facial pressure. No other ENT, GI, or respiratory issue noted since the last visit.   Exam: The nasal cavities were decongested and anesthetised with a combination of oxymetazoline and 4% lidocaine solution.  The flexible scope was inserted into the right nasal cavity.  Endoscopy of the inferior and middle meatus was performed.  Edematous mucosa was noted.  No polyp, mass, or lesion was appreciated.  Olfactory cleft was clear.  Nasopharynx was clear.  Turbinates were hypertrophied but without mass.  Incomplete response to decongestion.  The procedure was repeated on the contralateral side with significant NSD.  The patient tolerated the procedure well.  Instructions were given to avoid eating or drinking for 2 hours.    Assessment: 1.  Bidirectional septal deviation is noted, which is significantly worse on the left. Bilateral inferior turbinate hypertrophy without evidence of infection.  Plan:  1.  Nasal endoscopy findings are reviewed with the patient. 2.  Treatment options include continuing conservative management with daily Flonase versus septoplasty and bilateral turbinate reduction.  The risks, benefits, details, and alternatives of the procedure are discussed with the patient. Questions are invited and answered. 3.   The patient would like to proceed with surgical intervention. The procedure will be scheduled in accordance to the patient's schedule.

## 2015-10-07 NOTE — Transfer of Care (Signed)
Immediate Anesthesia Transfer of Care Note  Patient: [name] [name]  Procedure(s) Performed: Procedure(s): NASAL SEPTOPLASTY WITH TURBINATE REDUCTION (Bilateral)  Patient Location: PACU  Anesthesia Type:General  Level of Consciousness: awake, alert , oriented and patient cooperative  Airway & Oxygen Therapy: Patient Spontanous Breathing and Patient connected to face mask oxygen  Post-op Assessment: Report given to RN, Post -op Vital signs reviewed and stable, Patient moving all extremities X 4 and Patient able to stick tongue midline  Post vital signs: Reviewed and stable  Last Vitals:  Filed Vitals:   10/07/15 0624 10/07/15 0944  BP: 166/91   Pulse: 75   Temp: 36.6 C 36.4 C  Resp: 18     Last Pain: There were no vitals filed for this visit.       Complications: No apparent anesthesia complications

## 2015-10-07 NOTE — Progress Notes (Signed)
Pt. Continuing to bleed from nose. I have changed his gauze nose drip dressing 2 times now. Will continue to monitor.

## 2015-10-07 NOTE — Progress Notes (Signed)
Pt. On continuous pulse-ox.

## 2015-10-07 NOTE — Progress Notes (Signed)
Gauze dressing beneath nose changed.  Bright red blood on prior gauze dressing.  5 sterile 4 x 4s rolled up and taped beneath nose after cleansing skin.  Patient expressed his appreciation for care.

## 2015-10-07 NOTE — Op Note (Signed)
DATE OF PROCEDURE: 10/07/2015  OPERATIVE REPORT   SURGEON: Leta Baptist, MD   PREOPERATIVE DIAGNOSES:  1. Severe nasal septal deviation.  2. Bilateral inferior turbinate hypertrophy.  3. Chronic nasal obstruction.  POSTOPERATIVE DIAGNOSES:  1. Severe nasal septal deviation.  2. Bilateral inferior turbinate hypertrophy.  3. Chronic nasal obstruction.  PROCEDURE PERFORMED:  1. Septoplasty.  2. Bilateral partial inferior turbinate resection.   ANESTHESIA: General endotracheal tube anesthesia.   COMPLICATIONS: None.   ESTIMATED BLOOD LOSS: Approximately 100 mL.   INDICATION FOR PROCEDURE: [name] [name] is a 75 y.o. male with a history of chronic nasal obstruction. The patient was  treated with antihistamine, decongestant, steroid nasal spray, and systemic steroids. However, the patient continues to be symptomatic. On examination, the patient was noted to have bilateral severe inferior turbinate hypertrophy and significant nasal septal deviation, causing significant nasal obstruction. Based on the above findings, the decision was made for the patient to undergo the above-stated procedure. The risks, benefits, alternatives, and details of the procedure were discussed with the patient. Questions were invited and answered. Informed consent was obtained.   DESCRIPTION OF PROCEDURE: The patient was taken to the operating room and placed supine on the operating table. General endotracheal tube anesthesia was administered by the anesthesiologist. The patient was positioned, and prepped and draped in the standard fashion for nasal surgery. Pledgets soaked with Afrin were placed in both nasal cavities for decongestion. The pledgets were subsequently removed. The above mentioned severe septal deviation was again noted. 1% lidocaine with 1:100,000 epinephrine was injected onto the nasal septum bilaterally. A hemitransfixion incision was made on the left side. The mucosal flap was carefully elevated on the  left side. A cartilaginous incision was made 1 cm superior to the caudal margin of the nasal septum. Mucosal flap was also elevated on the right side in the similar fashion. It should be noted that due to the severe septal deviation, the deviated portion of the cartilaginous and bony septum had to be removed in piecemeal fashion. Once the deviated portions were removed, a straight midline septum was achieved. The septum was then quilted with 4-0 plain gut sutures. The hemitransfixion incision was closed with interrupted 4-0 chromic sutures. Doyle splints were applied.   Prior to the [facility] splint application, the inferior one half of both hypertrophied inferior turbinate was crossclamped with a Kelly clamp. The inferior one half of each inferior turbinate was then resected with a pair of cross cutting scissors. Hemostasis was achieved with a suction cautery device.   The care of the patient was turned over to the anesthesiologist. The patient was awakened from anesthesia without difficulty. The patient was extubated and transferred to the recovery room in good condition.   OPERATIVE FINDINGS: Severe nasal septal deviation and bilateral inferior turbinate hypertrophy.   SPECIMEN: None.   FOLLOWUP CARE: The patient be discharged home once he is awake and alert. The patient will be placed on Percocet 1-2 tablets p.o. q.6 hours p.r.n. pain, and amoxicillin 875 mg p.o. b.i.d. for 5 days. The patient will follow up in my office in approximately 1 week for splint removal.   Madisson Kulaga Raynelle Bring, MD

## 2015-10-07 NOTE — Progress Notes (Signed)
Bleeding from pt.'s nose is starting to slow down. Continuing to monitor.

## 2015-10-07 NOTE — Discharge Instructions (Signed)

## 2015-10-07 NOTE — Anesthesia Procedure Notes (Signed)
Procedure Name: Intubation Date/Time: 10/07/2015 8:31 AM Performed by: Collier Bullock Pre-anesthesia Checklist: Patient identified, Emergency Drugs available, Suction available and Patient being monitored Patient Re-evaluated:Patient Re-evaluated prior to inductionOxygen Delivery Method: Circle system utilized Preoxygenation: Pre-oxygenation with 100% oxygen Intubation Type: IV induction Laryngoscope Size: Mac and 3 Grade View: Grade I Tube type: Oral Tube size: 7.5 mm Number of attempts: 1 Airway Equipment and Method: Stylet Placement Confirmation: ETT inserted through vocal cords under direct vision,  positive ETCO2 and breath sounds checked- equal and bilateral Secured at: 24 cm Tube secured with: Tape Dental Injury: Teeth and Oropharynx as per pre-operative assessment and Injury to lip

## 2015-10-08 ENCOUNTER — Encounter (HOSPITAL_COMMUNITY): Payer: Self-pay | Admitting: Otolaryngology

## 2015-10-08 DIAGNOSIS — J3489 Other specified disorders of nose and nasal sinuses: Secondary | ICD-10-CM | POA: Diagnosis not present

## 2015-10-08 DIAGNOSIS — I2510 Atherosclerotic heart disease of native coronary artery without angina pectoris: Secondary | ICD-10-CM | POA: Diagnosis not present

## 2015-10-08 DIAGNOSIS — Z87891 Personal history of nicotine dependence: Secondary | ICD-10-CM | POA: Diagnosis not present

## 2015-10-08 DIAGNOSIS — J342 Deviated nasal septum: Secondary | ICD-10-CM | POA: Diagnosis not present

## 2015-10-08 DIAGNOSIS — J343 Hypertrophy of nasal turbinates: Secondary | ICD-10-CM | POA: Diagnosis not present

## 2015-10-08 DIAGNOSIS — G4730 Sleep apnea, unspecified: Secondary | ICD-10-CM | POA: Diagnosis not present

## 2015-10-08 NOTE — Progress Notes (Signed)
Discharge papers gone over with pt. Pt. Reports prescriptions already given to his son by the doctor. I called the son to make sure he had the prescriptions and he said he did. IV taken out. No questions/complaints. I sent pt. Home with extra gauze/tape. Pt. D/c'd successfully via w/c.

## 2015-10-08 NOTE — Discharge Summary (Signed)
Physician Discharge Summary  Patient ID: [name] [name] MRN: [id number] DOB/AGE: 29-Jun-1940 75 y.o.  Admit date: 10/07/2015 Discharge date: 10/08/2015  Admission Diagnoses: Chronic nasal obstruction  Discharge Diagnoses: Chronic nasal obstruction Active Problems:   S/P nasal septoplasty   Discharged Condition: good  Hospital Course: Pt had an uneventful overnight stay. Nasal bleeding has slowed.  Consults: None  Significant Diagnostic Studies: None  Treatments: surgery: Septoplasty and partial inferior turbinate resection  Discharge Exam: Blood pressure 161/81, pulse 77, temperature 97.5 F (36.4 C), temperature source Axillary, resp. rate 19, height 5\' 6"  (1.676 m), weight 126.1 kg (278 lb), SpO2 95 %. Nasal drip pad in place. No significant bleeding.  Disposition: 01-Home or Self Care  Discharge Instructions    Activity as tolerated - No restrictions    Complete by:  As directed      Activity as tolerated - No restrictions    Complete by:  As directed      Diet general    Complete by:  As directed      Diet general    Complete by:  As directed             Medication List    STOP taking these medications        acetaminophen 325 MG tablet  Commonly known as:  TYLENOL     fluticasone 50 MCG/ACT nasal spray  Commonly known as:  FLONASE      TAKE these medications        amoxicillin 875 MG tablet  Commonly known as:  AMOXIL  Take 1 tablet (875 mg total) by mouth 2 (two) times daily.     bisacodyl 5 MG EC tablet  Commonly known as:  DULCOLAX  Take 5 mg by mouth daily as needed for moderate constipation.     calcium citrate 950 MG tablet  Commonly known as:  CALCITRATE - dosed in mg elemental calcium  Take 200 mg of elemental calcium by mouth daily.     cyclobenzaprine 10 MG tablet  Commonly known as:  FLEXERIL  Take 1 tablet (10 mg total) by mouth 3 (three) times daily as needed for muscle spasms.     docusate sodium 100 MG capsule  Commonly known  as:  COLACE  Take 100 mg by mouth daily as needed for mild constipation. Reported on 09/11/2015     dorzolamide-timolol 22.3-6.8 MG/ML ophthalmic solution  Commonly known as:  COSOPT  Place 1 drop into both eyes 2 (two) times daily.     GINGER ROOT PO  Take 1 tablet by mouth daily.     IRON PO  Take 325 mg by mouth daily.     latanoprost 0.005 % ophthalmic solution  Commonly known as:  XALATAN  Place 1 drop into both eyes at bedtime.     LUBRICANT EYE DROPS 0.4-0.3 % Soln  Generic drug:  Polyethyl Glycol-Propyl Glycol  Apply 1 drop to eye daily as needed (for lubricant eye drops).     metoprolol succinate 25 MG 24 hr tablet  Commonly known as:  TOPROL-XL  Take 25 mg by mouth daily.     metoprolol tartrate 25 MG tablet  Commonly known as:  LOPRESSOR  Take 25 mg by mouth daily.     oxyCODONE-acetaminophen 5-325 MG tablet  Commonly known as:  ROXICET  Take 1 tablet by mouth every 4 (four) hours as needed for severe pain.     pantoprazole 40 MG tablet  Commonly known as:  PROTONIX  Take  40 mg by mouth 2 (two) times daily.     potassium chloride 20 MEQ packet  Commonly known as:  KLOR-CON  Take 20 mEq by mouth 2 (two) times daily.     torsemide 20 MG tablet  Commonly known as:  DEMADEX  Take 2 tablets (40 mg total) by mouth 2 (two) times daily. Resume on 10/19           Follow-up Information    Follow up with Ascencion Dike, MD On 10/12/2015.   Specialty:  Otolaryngology   Why:  As scheduled   Contact information:   [place]  [id number] [phone]       Signed: Ascencion Dike 10/08/2015, 7:13 AM

## 2015-10-13 DIAGNOSIS — I10 Essential (primary) hypertension: Secondary | ICD-10-CM | POA: Diagnosis not present

## 2015-10-13 DIAGNOSIS — E876 Hypokalemia: Secondary | ICD-10-CM | POA: Diagnosis not present

## 2015-10-13 DIAGNOSIS — R809 Proteinuria, unspecified: Secondary | ICD-10-CM | POA: Diagnosis not present

## 2015-10-13 DIAGNOSIS — I509 Heart failure, unspecified: Secondary | ICD-10-CM | POA: Diagnosis not present

## 2015-10-13 DIAGNOSIS — N189 Chronic kidney disease, unspecified: Secondary | ICD-10-CM | POA: Diagnosis not present

## 2015-10-13 DIAGNOSIS — E559 Vitamin D deficiency, unspecified: Secondary | ICD-10-CM | POA: Diagnosis not present

## 2015-10-13 DIAGNOSIS — G4730 Sleep apnea, unspecified: Secondary | ICD-10-CM | POA: Diagnosis not present

## 2015-10-14 DIAGNOSIS — H401123 Primary open-angle glaucoma, left eye, severe stage: Secondary | ICD-10-CM | POA: Diagnosis not present

## 2015-10-14 DIAGNOSIS — H2512 Age-related nuclear cataract, left eye: Secondary | ICD-10-CM | POA: Diagnosis not present

## 2015-10-19 ENCOUNTER — Encounter (HOSPITAL_COMMUNITY): Payer: Self-pay | Admitting: Emergency Medicine

## 2015-10-19 ENCOUNTER — Emergency Department (HOSPITAL_COMMUNITY)
Admission: EM | Admit: 2015-10-19 | Discharge: 2015-10-19 | Disposition: A | Discharge status: Home / Self Care | Payer: Medicare Other | Attending: Emergency Medicine | Admitting: Emergency Medicine

## 2015-10-19 ENCOUNTER — Emergency Department (HOSPITAL_COMMUNITY): Payer: Medicare Other

## 2015-10-19 DIAGNOSIS — Z7982 Long term (current) use of aspirin: Secondary | ICD-10-CM | POA: Insufficient documentation

## 2015-10-19 DIAGNOSIS — R601 Generalized edema: Secondary | ICD-10-CM | POA: Diagnosis not present

## 2015-10-19 DIAGNOSIS — E785 Hyperlipidemia, unspecified: Secondary | ICD-10-CM | POA: Insufficient documentation

## 2015-10-19 DIAGNOSIS — I110 Hypertensive heart disease with heart failure: Secondary | ICD-10-CM | POA: Diagnosis not present

## 2015-10-19 DIAGNOSIS — E039 Hypothyroidism, unspecified: Secondary | ICD-10-CM | POA: Insufficient documentation

## 2015-10-19 DIAGNOSIS — Z87891 Personal history of nicotine dependence: Secondary | ICD-10-CM | POA: Insufficient documentation

## 2015-10-19 DIAGNOSIS — R079 Chest pain, unspecified: Secondary | ICD-10-CM | POA: Insufficient documentation

## 2015-10-19 DIAGNOSIS — I509 Heart failure, unspecified: Secondary | ICD-10-CM | POA: Diagnosis not present

## 2015-10-19 DIAGNOSIS — M1990 Unspecified osteoarthritis, unspecified site: Secondary | ICD-10-CM | POA: Insufficient documentation

## 2015-10-19 DIAGNOSIS — Z79899 Other long term (current) drug therapy: Secondary | ICD-10-CM | POA: Insufficient documentation

## 2015-10-19 DIAGNOSIS — M7989 Other specified soft tissue disorders: Secondary | ICD-10-CM | POA: Diagnosis not present

## 2015-10-19 LAB — CBC WITH DIFFERENTIAL/PLATELET
Basophils Absolute: 0 10*3/uL (ref 0.0–0.1)
Basophils Relative: 0 %
EOS ABS: 0 10*3/uL (ref 0.0–0.7)
EOS PCT: 0 %
HCT: 40.9 % (ref 39.0–52.0)
Hemoglobin: 13.2 g/dL (ref 13.0–17.0)
Lymphocytes Relative: 7 %
Lymphs Abs: 1 10*3/uL (ref 0.7–4.0)
MCH: 26.7 pg (ref 26.0–34.0)
MCHC: 32.3 g/dL (ref 30.0–36.0)
MCV: 82.8 fL (ref 78.0–100.0)
MONO ABS: 1 10*3/uL (ref 0.1–1.0)
Monocytes Relative: 7 %
NEUTROS PCT: 86 %
Neutro Abs: 12.3 10*3/uL — ABNORMAL HIGH (ref 1.7–7.7)
Platelets: 129 10*3/uL — ABNORMAL LOW (ref 150–400)
RBC: 4.94 MIL/uL (ref 4.22–5.81)
RDW: 17.8 % — AB (ref 11.5–15.5)
WBC: 14.3 10*3/uL — AB (ref 4.0–10.5)

## 2015-10-19 LAB — HEPATIC FUNCTION PANEL
ALT: 15 U/L — ABNORMAL LOW (ref 17–63)
AST: 19 U/L (ref 15–41)
Albumin: 3.2 g/dL — ABNORMAL LOW (ref 3.5–5.0)
Alkaline Phosphatase: 63 U/L (ref 38–126)
BILIRUBIN INDIRECT: 0.7 mg/dL (ref 0.3–0.9)
Bilirubin, Direct: 0.2 mg/dL (ref 0.1–0.5)
TOTAL PROTEIN: 6.2 g/dL — AB (ref 6.5–8.1)
Total Bilirubin: 0.9 mg/dL (ref 0.3–1.2)

## 2015-10-19 LAB — BASIC METABOLIC PANEL
Anion gap: 5 (ref 5–15)
BUN: 16 mg/dL (ref 6–20)
CALCIUM: 8.8 mg/dL — AB (ref 8.9–10.3)
CO2: 30 mmol/L (ref 22–32)
CREATININE: 0.87 mg/dL (ref 0.61–1.24)
Chloride: 102 mmol/L (ref 101–111)
GFR calc non Af Amer: >60 mL/min (ref >60)
Glucose, Bld: 118 mg/dL — ABNORMAL HIGH (ref 65–99)
Potassium: 3.9 mmol/L (ref 3.5–5.1)
SODIUM: 137 mmol/L (ref 135–145)

## 2015-10-19 LAB — BRAIN NATRIURETIC PEPTIDE: B NATRIURETIC PEPTIDE 5: 12 pg/mL (ref 0.0–100.0)

## 2015-10-19 LAB — TROPONIN I

## 2015-10-19 NOTE — ED Notes (Signed)
Pt states he has been having swelling in his legs and feet for the past week.

## 2015-10-19 NOTE — Discharge Instructions (Signed)
Increased your Demadex to 3 pills twice a day and increase your potassium to 3 times a day. Do this for 4 days and then go back to normal dose. Follow-up with your family doctor either the end of this week or beginning of next week. Return if worsening

## 2015-10-19 NOTE — ED Provider Notes (Signed)
CSN: [id number]     Arrival date & time 10/19/15  1117 History  By signing my name below, I, [name] [name], attest that this documentation has been prepared under the direction and in the presence of Milton Ferguson, MD. Electronically Signed: Rayna [name], ED Scribe. 10/19/2015. 12:47 PM.   Chief Complaint  Patient presents with  . Leg Swelling   Patient is a 75 y.o. male presenting with general illness. The history is provided by the patient. No language interpreter was used.  Illness Location:  BLE Quality:  Leg swelling Severity:  Moderate Onset quality:  Gradual Duration:  1 week Timing:  Constant Progression:  Worsening Chronicity:  Recurrent Associated symptoms: chest pain, myalgias and shortness of breath   Associated symptoms: no abdominal pain, no congestion, no cough, no diarrhea, no fatigue, no headaches and no rash   Risk factors:  CHF, obesity   HPI Comments: [name] [name] is a 75 y.o. male with a significant medical history listed below who presents to the Emergency Department complaining of worsening, moderate, BLE swelling x 1 week. He reports associated, mild, SOB and moderate, diffuse, pain across the affected region. He confirms a hx of similar symptoms and his PMHx including CHF. Pt confirms his listed PCP. He denies abd swelling or any other associated symptoms at this time.   Past Medical History  Diagnosis Date  . CHF (congestive heart failure) ([place])   . Obesity   . Hyperlipidemia   . PUD (peptic ulcer disease)   . GERD (gastroesophageal reflux disease)   . S/P endoscopy 2007, 2012    2007: nl, May 2012: antral erosions  . Osteoarthritis   . S/P colonoscopy 2007, Feb and March 2012    hx of adenomas, left-sided diverticula, On 07/2010 TCS, fresh blood and clot noted coming from TI.  Marland Kitchen Ringing in ears   . History of bladder infections   . Heel spur     right heel  . Collagen vascular disease ([place])     history of venous insufficency and LE cellulitis   . Thrombocytopenia ([place]) 12/30/2011    Stable  . BPH (benign prostatic hyperplasia)   . Hypertension     Sees Dr. Debara Pickett  . Bilateral lower leg cellulitis   . Deep venous insufficiency   . Chronic diastolic HF (heart failure) ([place])   . SIRS (systemic inflammatory response syndrome) ([place]) 02/27/2012  . History of colonic polyps   . Complication of anesthesia     2012 due to sleep apnea pt has had difficulty being put under  and waking up from anesthesia  . PONV (postoperative nausea and vomiting)   . Sleep apnea     uses cpap  occ "unable to use lately due to nasal pain when use" - sleep study 2008 AHI 48.80/hr and 56.70hr during REM  . Glaucoma     left  . Cataract     left  . Hypothyroidism     hypothyroid denies no rx  . Headache   . Kidney stones     passed them   Past Surgical History  Procedure Laterality Date  . Knee arthroscopy Left ~ 2000  . Shoulder open rotator cuff repair Left early 2000s  . Lumbar disc surgery  2012  . Small bowel capsule study  07/2011    ?transient focal ischemia in distal small bowel to explain GI bleeding  . Esophagogastroduodenoscopy  Aug 2013    [facility], single 31mm pedunculated polyp otherwise normal. HYPERPLASTIC, NEGATIVE h.pylori  .  Cardiac catheterization  02/27/2007    no sign of CAD, LVH, mod pulm HTN (Dr. Jackie Plum(  . Transurethral resection of prostate    . Transurethral resection of prostate N/A 06/18/2013    Procedure: TRANSURETHRAL RESECTION OF THE PROSTATE (TURP);  Surgeon: Marissa Nestle, MD;  Location: [facility];  Service: Urology;  Laterality: N/A;  . Transthoracic echocardiogram  09/2008    EF 60-65%, mod conc LVH  . Nm myocar perf wall motion  12/2006    dipyridamole myoview - stress images show mild perfusion defect in mid inferior walls with reversibility at rest; mild perfusion defect in mid inferolateral wall at stress with mild defect reversibility, EF 62%, abnormal but low risk study   . Esophagogastroduodenoscopy N/A 05/28/2014     RMR:non-critical Schatzki's ring/HH. Benign gastritis  . Savory dilation N/A 05/28/2014    Procedure: SAVORY DILATION;  Surgeon: Daneil Dolin, MD;  Location: [facility];  Service: Endoscopy;  Laterality: N/A;  Venia Minks dilation N/A 05/28/2014    Procedure: Venia Minks DILATION;  Surgeon: Daneil Dolin, MD;  Location: [facility];  Service: Endoscopy;  Laterality: N/A;  . Colonoscopy N/A 05/28/2014    [id number] colonic polyps/colonic diverticulosis. Tubular adenomas. Next colonoscopy January 2021  . Posterior lumbar fusion  2015  . Cataract extraction w/ intraocular lens implant Right ~ 2012  . Nasal septum surgery Bilateral 10/07/2015     inferior turbinate resection  . Laparoscopic cholecystectomy    . Inguinal hernia repair Right 1941  . Back surgery    . Nasal septoplasty w/ turbinoplasty Bilateral 10/07/2015    Procedure: NASAL SEPTOPLASTY WITH TURBINATE REDUCTION;  Surgeon: Leta Baptist, MD;  Location: [facility];  Service: ENT;  Laterality: Bilateral;   Family History  Problem Relation Age of Onset  . Colon cancer Father 92    deceased  . Prostate cancer Father   . Pancreatic cancer Mother 91    deceased  . Prostate cancer Brother   . Arthritis Son   . Arthritis Son   . Diabetes Mellitus II Son    Social History  Substance Use Topics  . Smoking status: Former Smoker -- 8 years    Types: Cigars    Start date: 11/20/1959    Quit date: 08/10/1973  . Smokeless tobacco: Never Used  . Alcohol Use: 0.0 oz/week    0 Standard drinks or equivalent per week     Comment: 10/07/2015 "drank a little bit; stopped in ~ 1975"    Review of Systems  Constitutional: Negative for appetite change and fatigue.  HENT: Negative for congestion, ear discharge and sinus pressure.   Eyes: Negative for discharge.  Respiratory: Positive for shortness of breath. Negative for cough.   Cardiovascular: Positive for chest pain.  Gastrointestinal: Negative for abdominal pain and diarrhea.  Genitourinary:  Negative for frequency and hematuria.  Musculoskeletal: Positive for myalgias. Negative for back pain.  Skin: Negative for rash.  Neurological: Negative for seizures and headaches.  Psychiatric/Behavioral: Negative for hallucinations.    Allergies  Aspirin and Nexium  Home Medications   Prior to Admission medications   Medication Sig Start Date End Date Taking? Authorizing Provider  amoxicillin (AMOXIL) 875 MG tablet Take 1 tablet (875 mg total) by mouth 2 (two) times daily. 10/07/15   Leta Baptist, MD  bisacodyl (DULCOLAX) 5 MG EC tablet Take 5 mg by mouth daily as needed for moderate constipation.    Historical Provider, MD  calcium citrate (CALCITRATE - DOSED IN MG ELEMENTAL  CALCIUM) 950 MG tablet Take 200 mg of elemental calcium by mouth daily.    Historical Provider, MD  cyclobenzaprine (FLEXERIL) 10 MG tablet Take 1 tablet (10 mg total) by mouth 3 (three) times daily as needed for muscle spasms. 08/31/12   Ashok Pall, MD  docusate sodium (COLACE) 100 MG capsule Take 100 mg by mouth daily as needed for mild constipation. Reported on 09/11/2015    Historical Provider, MD  dorzolamide-timolol (COSOPT) 22.3-6.8 MG/ML ophthalmic solution Place 1 drop into both eyes 2 (two) times daily.  12/17/14 12/17/15  Historical Provider, MD  Ginger, Zingiber officinalis, (GINGER ROOT PO) Take 1 tablet by mouth daily.     Historical Provider, MD  IRON PO Take 325 mg by mouth daily.     Historical Provider, MD  latanoprost (XALATAN) 0.005 % ophthalmic solution Place 1 drop into both eyes at bedtime.    Historical Provider, MD  metoprolol succinate (TOPROL-XL) 25 MG 24 hr tablet Take 25 mg by mouth daily.    Historical Provider, MD  metoprolol tartrate (LOPRESSOR) 25 MG tablet Take 25 mg by mouth daily.    Historical Provider, MD  oxyCODONE-acetaminophen (ROXICET) 5-325 MG tablet Take 1 tablet by mouth every 4 (four) hours as needed for severe pain. 10/07/15   Leta Baptist, MD  pantoprazole (PROTONIX) 40 MG tablet  Take 40 mg by mouth 2 (two) times daily.    Historical Provider, MD  Polyethyl Glycol-Propyl Glycol (LUBRICANT EYE DROPS) 0.4-0.3 % SOLN Apply 1 drop to eye daily as needed (for lubricant eye drops).    Historical Provider, MD  potassium chloride (KLOR-CON) 20 MEQ packet Take 20 mEq by mouth 2 (two) times daily. Patient taking differently: Take 20 mEq by mouth 2 (two) times daily. Takes one every other day 11/14/13   Arnoldo Lenis, MD  torsemide (DEMADEX) 20 MG tablet Take 2 tablets (40 mg total) by mouth 2 (two) times daily. Resume on 10/19 Patient taking differently: Take 40 mg by mouth 2 (two) times daily.  03/08/15   Kathie Dike, MD   BP 138/81 mmHg  Pulse 90  Temp(Src) 98.1 F (36.7 C) (Temporal)  Resp 18  Ht 5\' 9"  (1.753 m)  Wt 272 lb (123.378 kg)  BMI 40.15 kg/m2  SpO2 94%    Physical Exam  Constitutional: He is oriented to person, place, and time. He appears well-developed.  HENT:  Head: Normocephalic.  Eyes: Conjunctivae and EOM are normal. No scleral icterus.  Neck: Neck supple. No thyromegaly present.  Cardiovascular: Normal rate and regular rhythm.  Exam reveals no gallop and no friction rub.   No murmur heard. Pulmonary/Chest: No stridor. He has no wheezes. He has no rales. He exhibits no tenderness.  Abdominal: He exhibits no distension. There is no tenderness. There is no rebound.  Musculoskeletal: Normal range of motion. He exhibits edema.  3+ edema to BLE up to his knees  Lymphadenopathy:    He has no cervical adenopathy.  Neurological: He is oriented to person, place, and time. He exhibits normal muscle tone. Coordination normal.  Skin: No rash noted. No erythema.  Psychiatric: He has a normal mood and affect. His behavior is normal.    ED Course  Procedures  DIAGNOSTIC STUDIES: Oxygen Saturation is 94% on RA, adequate by my interpretation.    COORDINATION OF CARE: 12:46 PM Discussed next steps with pt. Pt verbalized understanding and is agreeable  with the plan.   Labs Review Labs Reviewed  CBC WITH DIFFERENTIAL/PLATELET - Abnormal; Notable  for the following:    WBC 14.3 (*)    RDW 17.8 (*)    Platelets 129 (*)    All other components within normal limits  BASIC METABOLIC PANEL  BRAIN NATRIURETIC PEPTIDE    Imaging Review Dg Chest 2 View  10/19/2015  CLINICAL DATA:  Leg swelling for 1 week EXAM: CHEST  2 VIEW COMPARISON:  09/01/2013 FINDINGS: Cardiac shadow is within normal limits. Minimal bibasilar scarring is seen. No focal infiltrate or sizable effusion is noted. No bony abnormality is seen. IMPRESSION: No active cardiopulmonary disease. Electronically Signed   By: Inez Catalina M.D.   On: 10/19/2015 12:00   I have personally reviewed and evaluated these images and lab results as part of my medical decision-making.   EKG Interpretation None      MDM   Final diagnoses:  None   Patient with worsening edema in his lower extremities. BNP normal troponin normal chemistries normal. I will increase his Demadex 50% above the density taking now. I will also increase his potassium 50%. He will take this extra dose for 4 days and follow-up with his PCP  The chart was scribed for me under my direct supervision.  I personally performed the history, physical, and medical decision making and all procedures in the evaluation of this patient.Milton Ferguson, MD 10/19/15 1537

## 2015-10-20 DIAGNOSIS — I739 Peripheral vascular disease, unspecified: Secondary | ICD-10-CM | POA: Diagnosis not present

## 2015-10-26 ENCOUNTER — Ambulatory Visit (INDEPENDENT_AMBULATORY_CARE_PROVIDER_SITE_OTHER): Payer: Medicare Other | Admitting: Otolaryngology

## 2015-10-28 ENCOUNTER — Encounter: Payer: Self-pay | Admitting: Internal Medicine

## 2015-10-28 DIAGNOSIS — I872 Venous insufficiency (chronic) (peripheral): Secondary | ICD-10-CM | POA: Diagnosis not present

## 2015-10-28 DIAGNOSIS — R600 Localized edema: Secondary | ICD-10-CM | POA: Diagnosis not present

## 2015-10-28 DIAGNOSIS — Z6841 Body Mass Index (BMI) 40.0 and over, adult: Secondary | ICD-10-CM | POA: Diagnosis not present

## 2015-10-28 DIAGNOSIS — R51 Headache: Secondary | ICD-10-CM | POA: Diagnosis not present

## 2015-11-30 DIAGNOSIS — H401123 Primary open-angle glaucoma, left eye, severe stage: Secondary | ICD-10-CM | POA: Diagnosis not present

## 2015-11-30 DIAGNOSIS — Z886 Allergy status to analgesic agent status: Secondary | ICD-10-CM | POA: Diagnosis not present

## 2015-11-30 DIAGNOSIS — Z888 Allergy status to other drugs, medicaments and biological substances status: Secondary | ICD-10-CM | POA: Diagnosis not present

## 2015-11-30 DIAGNOSIS — H2512 Age-related nuclear cataract, left eye: Secondary | ICD-10-CM | POA: Diagnosis not present

## 2015-12-08 DIAGNOSIS — Z886 Allergy status to analgesic agent status: Secondary | ICD-10-CM | POA: Diagnosis not present

## 2015-12-08 DIAGNOSIS — Z888 Allergy status to other drugs, medicaments and biological substances status: Secondary | ICD-10-CM | POA: Diagnosis not present

## 2015-12-08 DIAGNOSIS — Z9989 Dependence on other enabling machines and devices: Secondary | ICD-10-CM | POA: Diagnosis not present

## 2015-12-08 DIAGNOSIS — K219 Gastro-esophageal reflux disease without esophagitis: Secondary | ICD-10-CM | POA: Diagnosis not present

## 2015-12-08 DIAGNOSIS — Z961 Presence of intraocular lens: Secondary | ICD-10-CM | POA: Diagnosis not present

## 2015-12-08 DIAGNOSIS — M549 Dorsalgia, unspecified: Secondary | ICD-10-CM | POA: Diagnosis not present

## 2015-12-08 DIAGNOSIS — H35033 Hypertensive retinopathy, bilateral: Secondary | ICD-10-CM | POA: Diagnosis not present

## 2015-12-08 DIAGNOSIS — Z87891 Personal history of nicotine dependence: Secondary | ICD-10-CM | POA: Diagnosis not present

## 2015-12-08 DIAGNOSIS — G4733 Obstructive sleep apnea (adult) (pediatric): Secondary | ICD-10-CM | POA: Diagnosis not present

## 2015-12-08 DIAGNOSIS — H401123 Primary open-angle glaucoma, left eye, severe stage: Secondary | ICD-10-CM | POA: Diagnosis not present

## 2015-12-08 DIAGNOSIS — I110 Hypertensive heart disease with heart failure: Secondary | ICD-10-CM | POA: Diagnosis not present

## 2015-12-08 DIAGNOSIS — H2512 Age-related nuclear cataract, left eye: Secondary | ICD-10-CM | POA: Diagnosis not present

## 2015-12-08 DIAGNOSIS — Z9841 Cataract extraction status, right eye: Secondary | ICD-10-CM | POA: Diagnosis not present

## 2015-12-08 DIAGNOSIS — I5033 Acute on chronic diastolic (congestive) heart failure: Secondary | ICD-10-CM | POA: Diagnosis not present

## 2015-12-08 DIAGNOSIS — E039 Hypothyroidism, unspecified: Secondary | ICD-10-CM | POA: Diagnosis not present

## 2015-12-08 DIAGNOSIS — Z79899 Other long term (current) drug therapy: Secondary | ICD-10-CM | POA: Diagnosis not present

## 2015-12-09 DIAGNOSIS — K219 Gastro-esophageal reflux disease without esophagitis: Secondary | ICD-10-CM | POA: Diagnosis not present

## 2015-12-09 DIAGNOSIS — H401123 Primary open-angle glaucoma, left eye, severe stage: Secondary | ICD-10-CM | POA: Diagnosis not present

## 2015-12-09 DIAGNOSIS — I5033 Acute on chronic diastolic (congestive) heart failure: Secondary | ICD-10-CM | POA: Diagnosis not present

## 2015-12-09 DIAGNOSIS — E039 Hypothyroidism, unspecified: Secondary | ICD-10-CM | POA: Diagnosis not present

## 2015-12-09 DIAGNOSIS — G4733 Obstructive sleep apnea (adult) (pediatric): Secondary | ICD-10-CM | POA: Diagnosis not present

## 2015-12-09 DIAGNOSIS — I110 Hypertensive heart disease with heart failure: Secondary | ICD-10-CM | POA: Diagnosis not present

## 2015-12-17 DIAGNOSIS — M170 Bilateral primary osteoarthritis of knee: Secondary | ICD-10-CM | POA: Diagnosis not present

## 2015-12-17 DIAGNOSIS — M961 Postlaminectomy syndrome, not elsewhere classified: Secondary | ICD-10-CM | POA: Diagnosis not present

## 2015-12-17 DIAGNOSIS — M545 Low back pain: Secondary | ICD-10-CM | POA: Diagnosis not present

## 2015-12-17 DIAGNOSIS — G894 Chronic pain syndrome: Secondary | ICD-10-CM | POA: Diagnosis not present

## 2015-12-17 DIAGNOSIS — M25562 Pain in left knee: Secondary | ICD-10-CM | POA: Diagnosis not present

## 2015-12-17 DIAGNOSIS — M25561 Pain in right knee: Secondary | ICD-10-CM | POA: Diagnosis not present

## 2015-12-17 DIAGNOSIS — M47817 Spondylosis without myelopathy or radiculopathy, lumbosacral region: Secondary | ICD-10-CM | POA: Diagnosis not present

## 2015-12-22 DIAGNOSIS — M545 Low back pain: Secondary | ICD-10-CM | POA: Diagnosis not present

## 2015-12-22 DIAGNOSIS — M961 Postlaminectomy syndrome, not elsewhere classified: Secondary | ICD-10-CM | POA: Diagnosis not present

## 2015-12-22 DIAGNOSIS — M79651 Pain in right thigh: Secondary | ICD-10-CM | POA: Diagnosis not present

## 2015-12-22 DIAGNOSIS — M47817 Spondylosis without myelopathy or radiculopathy, lumbosacral region: Secondary | ICD-10-CM | POA: Diagnosis not present

## 2015-12-29 DIAGNOSIS — I739 Peripheral vascular disease, unspecified: Secondary | ICD-10-CM | POA: Diagnosis not present

## 2016-01-05 ENCOUNTER — Ambulatory Visit (INDEPENDENT_AMBULATORY_CARE_PROVIDER_SITE_OTHER): Payer: Medicare Other | Admitting: Urology

## 2016-01-05 DIAGNOSIS — N401 Enlarged prostate with lower urinary tract symptoms: Secondary | ICD-10-CM

## 2016-01-05 DIAGNOSIS — R351 Nocturia: Secondary | ICD-10-CM | POA: Diagnosis not present

## 2016-01-05 DIAGNOSIS — N5201 Erectile dysfunction due to arterial insufficiency: Secondary | ICD-10-CM | POA: Diagnosis not present

## 2016-01-11 DIAGNOSIS — M47817 Spondylosis without myelopathy or radiculopathy, lumbosacral region: Secondary | ICD-10-CM | POA: Diagnosis not present

## 2016-01-11 DIAGNOSIS — M25511 Pain in right shoulder: Secondary | ICD-10-CM | POA: Diagnosis not present

## 2016-01-11 DIAGNOSIS — M792 Neuralgia and neuritis, unspecified: Secondary | ICD-10-CM | POA: Diagnosis not present

## 2016-01-11 DIAGNOSIS — M961 Postlaminectomy syndrome, not elsewhere classified: Secondary | ICD-10-CM | POA: Diagnosis not present

## 2016-01-11 DIAGNOSIS — M545 Low back pain: Secondary | ICD-10-CM | POA: Diagnosis not present

## 2016-01-11 DIAGNOSIS — G894 Chronic pain syndrome: Secondary | ICD-10-CM | POA: Diagnosis not present

## 2016-01-11 DIAGNOSIS — M79651 Pain in right thigh: Secondary | ICD-10-CM | POA: Diagnosis not present

## 2016-01-11 DIAGNOSIS — M47812 Spondylosis without myelopathy or radiculopathy, cervical region: Secondary | ICD-10-CM | POA: Diagnosis not present

## 2016-01-11 DIAGNOSIS — M542 Cervicalgia: Secondary | ICD-10-CM | POA: Diagnosis not present

## 2016-01-13 DIAGNOSIS — D509 Iron deficiency anemia, unspecified: Secondary | ICD-10-CM | POA: Diagnosis not present

## 2016-01-13 DIAGNOSIS — I10 Essential (primary) hypertension: Secondary | ICD-10-CM | POA: Diagnosis not present

## 2016-01-13 DIAGNOSIS — R809 Proteinuria, unspecified: Secondary | ICD-10-CM | POA: Diagnosis not present

## 2016-01-13 DIAGNOSIS — Z79899 Other long term (current) drug therapy: Secondary | ICD-10-CM | POA: Diagnosis not present

## 2016-01-13 DIAGNOSIS — N183 Chronic kidney disease, stage 3 (moderate): Secondary | ICD-10-CM | POA: Diagnosis not present

## 2016-01-13 DIAGNOSIS — E559 Vitamin D deficiency, unspecified: Secondary | ICD-10-CM | POA: Diagnosis not present

## 2016-01-14 ENCOUNTER — Ambulatory Visit (HOSPITAL_COMMUNITY): Payer: Medicare Other | Admitting: Oncology

## 2016-01-14 ENCOUNTER — Other Ambulatory Visit (HOSPITAL_COMMUNITY): Payer: Medicare Other

## 2016-01-14 NOTE — Assessment & Plan Note (Signed)
Leukocytosis with neutrophilia dating back to at least 2010; JAK2 and BCR/ABL negative.  No documented peripheral flow cytometry.    Labs today: CBC diff, peripheral FLOW cytometry.  I personally reviewed and went over laboratory results with the patient.  The results are noted within this dictation.  Labs in 6 months: CBC diff.

## 2016-01-14 NOTE — Progress Notes (Signed)
Rescheduled  ROS  

## 2016-01-14 NOTE — Assessment & Plan Note (Signed)
Thrombocytopenia dating back to at least 2009 ranging in the 80's to 150's.  Very stable.    Labs today CBC diff.  I personally reviewed and went over laboratory results with the patient.  The results are noted within this dictation.  Labs in 6 months: CBC diff.  Return in 6 months for follow-up

## 2016-01-19 DIAGNOSIS — G4730 Sleep apnea, unspecified: Secondary | ICD-10-CM | POA: Diagnosis not present

## 2016-01-19 DIAGNOSIS — I10 Essential (primary) hypertension: Secondary | ICD-10-CM | POA: Diagnosis not present

## 2016-01-19 DIAGNOSIS — N189 Chronic kidney disease, unspecified: Secondary | ICD-10-CM | POA: Diagnosis not present

## 2016-01-19 DIAGNOSIS — I509 Heart failure, unspecified: Secondary | ICD-10-CM | POA: Diagnosis not present

## 2016-01-20 ENCOUNTER — Other Ambulatory Visit (HOSPITAL_COMMUNITY): Payer: Self-pay | Admitting: Physical Medicine and Rehabilitation

## 2016-01-20 DIAGNOSIS — M47812 Spondylosis without myelopathy or radiculopathy, cervical region: Secondary | ICD-10-CM | POA: Diagnosis not present

## 2016-01-20 DIAGNOSIS — M961 Postlaminectomy syndrome, not elsewhere classified: Secondary | ICD-10-CM | POA: Diagnosis not present

## 2016-01-20 DIAGNOSIS — M545 Low back pain: Secondary | ICD-10-CM | POA: Diagnosis not present

## 2016-01-20 DIAGNOSIS — G894 Chronic pain syndrome: Secondary | ICD-10-CM | POA: Diagnosis not present

## 2016-01-21 DIAGNOSIS — Z6841 Body Mass Index (BMI) 40.0 and over, adult: Secondary | ICD-10-CM | POA: Diagnosis not present

## 2016-01-21 DIAGNOSIS — Z1389 Encounter for screening for other disorder: Secondary | ICD-10-CM | POA: Diagnosis not present

## 2016-01-21 DIAGNOSIS — I10 Essential (primary) hypertension: Secondary | ICD-10-CM | POA: Diagnosis not present

## 2016-01-22 ENCOUNTER — Encounter: Payer: Self-pay | Admitting: Internal Medicine

## 2016-01-22 ENCOUNTER — Ambulatory Visit (INDEPENDENT_AMBULATORY_CARE_PROVIDER_SITE_OTHER): Payer: Medicare Other | Admitting: Internal Medicine

## 2016-01-22 VITALS — BP 175/92 | HR 68 | Temp 97.4°F | Ht 66.0 in | Wt 282.8 lb

## 2016-01-22 DIAGNOSIS — K5909 Other constipation: Secondary | ICD-10-CM

## 2016-01-22 DIAGNOSIS — G43A0 Cyclical vomiting, not intractable: Secondary | ICD-10-CM | POA: Diagnosis not present

## 2016-01-22 DIAGNOSIS — K219 Gastro-esophageal reflux disease without esophagitis: Secondary | ICD-10-CM

## 2016-01-22 DIAGNOSIS — Z86010 Personal history of colonic polyps: Secondary | ICD-10-CM | POA: Diagnosis not present

## 2016-01-22 NOTE — Patient Instructions (Signed)
Continue Protonix 40 mg daily  May use Colace one or 2 tablets daily  Continue Ginger root as needed for nausea  Strive for continued weight loss  Office visit in 6 months  Have Dr. Gerarda Fraction recheck BP next week

## 2016-01-22 NOTE — Progress Notes (Signed)
Primary Care Physician:  [name]., MD Primary Gastroenterologist:  Dr. Gala Romney  Pre-Procedure History & Physical: HPI:  [name] [name] is a 75 y.o. male here for for follow-up of GERD, nausea and constipation. Doing very well with Protonix once daily Colace and Ginger root for nausea. Having 2-3 somewhat "pasty" BMs weekly. No bleeding. Had his sinuses "workded on" since he was last here. Weight up 5 pounds.   Past Medical History:  Diagnosis Date  . Bilateral lower leg cellulitis   . BPH (benign prostatic hyperplasia)   . Cataract    left  . CHF (congestive heart failure) ([place])   . Chronic diastolic HF (heart failure) ([place])   . Collagen vascular disease ([place])    history of venous insufficency and LE cellulitis  . Complication of anesthesia    2012 due to sleep apnea pt has had difficulty being put under  and waking up from anesthesia  . Deep venous insufficiency   . GERD (gastroesophageal reflux disease)   . Glaucoma    left  . Headache   . Heel spur    right heel  . History of bladder infections   . History of colonic polyps   . Hyperlipidemia   . Hypertension    Sees Dr. Debara Pickett  . Hypothyroidism    hypothyroid denies no rx  . Kidney stones    passed them  . Obesity   . Osteoarthritis   . PONV (postoperative nausea and vomiting)   . PUD (peptic ulcer disease)   . Ringing in ears   . S/P colonoscopy 2007, Feb and March 2012   hx of adenomas, left-sided diverticula, On 07/2010 TCS, fresh blood and clot noted coming from TI.  . S/P endoscopy 2007, 2012   2007: nl, May 2012: antral erosions  . SIRS (systemic inflammatory response syndrome) ([place]) 02/27/2012  . Sleep apnea    uses cpap  occ "unable to use lately due to nasal pain when use" - sleep study 2008 AHI 48.80/hr and 56.70hr during REM  . Thrombocytopenia ([place]) 12/30/2011   Stable    Past Surgical History:  Procedure Laterality Date  . BACK SURGERY    . CARDIAC CATHETERIZATION  02/27/2007   no sign of  CAD, LVH, mod pulm HTN (Dr. Jackie Plum(  . CATARACT EXTRACTION W/ INTRAOCULAR LENS IMPLANT Right ~ 2012  . COLONOSCOPY N/A 05/28/2014   [id number] colonic polyps/colonic diverticulosis. Tubular adenomas. Next colonoscopy January 2021  . ESOPHAGOGASTRODUODENOSCOPY  Aug 2013   [facility], single 19mm pedunculated polyp otherwise normal. HYPERPLASTIC, NEGATIVE h.pylori  . ESOPHAGOGASTRODUODENOSCOPY N/A 05/28/2014   RMR:non-critical Schatzki's ring/HH. Benign gastritis  . INGUINAL HERNIA REPAIR Right 1941  . KNEE ARTHROSCOPY Left ~ 2000  . LAPAROSCOPIC CHOLECYSTECTOMY    . [place] SURGERY  2012  . MALONEY DILATION N/A 05/28/2014   Procedure: Venia Minks DILATION;  Surgeon: Daneil Dolin, MD;  [facility];  Service: Endoscopy;  Laterality: N/A;  . NASAL SEPTOPLASTY W/ TURBINOPLASTY Bilateral 10/07/2015   Procedure: NASAL SEPTOPLASTY WITH TURBINATE REDUCTION;  Surgeon: Leta Baptist, MD;  Location: [facility];  Service: ENT;  Laterality: Bilateral;  . NASAL SEPTUM SURGERY Bilateral 10/07/2015    inferior turbinate resection  . NM MYOCAR PERF WALL MOTION  12/2006   dipyridamole myoview - stress images show mild perfusion defect in mid inferior walls with reversibility at rest; mild perfusion defect in mid inferolateral wall at stress with mild defect reversibility, EF 62%, abnormal but low risk study   .  POSTERIOR LUMBAR FUSION  2015  . SAVORY DILATION N/A 05/28/2014   Procedure: SAVORY DILATION;  Surgeon: Daneil Dolin, MD;  [facility];  Service: Endoscopy;  Laterality: N/A;  . SHOULDER OPEN ROTATOR CUFF REPAIR Left early 2000s  . small bowel capsule study  07/2011   ?transient focal ischemia in distal small bowel to explain GI bleeding  . TRANSTHORACIC ECHOCARDIOGRAM  09/2008   EF 60-65%, mod conc LVH  . TRANSURETHRAL RESECTION OF PROSTATE    . TRANSURETHRAL RESECTION OF PROSTATE N/A 06/18/2013   Procedure: TRANSURETHRAL RESECTION OF THE PROSTATE (TURP);  Surgeon: Marissa Nestle, MD;   Location: [facility];  Service: Urology;  Laterality: N/A;    Prior to Admission medications   Medication Sig Start Date End Date Taking? Authorizing Provider  acetaminophen (TYLENOL) 325 MG tablet Take 650 mg by mouth every 6 (six) hours as needed.   Yes Historical Provider, MD  bisacodyl (DULCOLAX) 5 MG EC tablet Take 5 mg by mouth daily as needed for moderate constipation.   Yes Historical Provider, MD  calcium citrate (CALCITRATE - DOSED IN MG ELEMENTAL CALCIUM) 950 MG tablet Take 200 mg of elemental calcium by mouth daily.   Yes Historical Provider, MD  Cholecalciferol (VITAMIN D3) 1000 units CAPS Take 1 capsule by mouth.   Yes Historical Provider, MD  docusate sodium (COLACE) 100 MG capsule Take 200 mg by mouth daily as needed for mild constipation. Reported on 09/11/2015   Yes Historical Provider, MD  dorzolamide-timolol (COSOPT) 22.3-6.8 MG/ML ophthalmic solution Place 1 drop into both eyes 2 (two) times daily. 12/17/14  Yes Historical Provider, MD  Ginger, Zingiber officinalis, (GINGER ROOT PO) Take 1 tablet by mouth daily.    Yes Historical Provider, MD  latanoprost (XALATAN) 0.005 % ophthalmic solution Place 1 drop into both eyes at bedtime.   Yes Historical Provider, MD  magnesium oxide (MAG-OX) 400 MG tablet Take 400 mg by mouth daily.   Yes Historical Provider, MD  Oxycodone HCl 10 MG TABS Take 10 mg by mouth 4 (four) times daily as needed (pain).   Yes Historical Provider, MD  pantoprazole (PROTONIX) 40 MG tablet Take 40 mg by mouth 2 (two) times daily.   Yes Historical Provider, MD  Potassium 99 MG TABS Take 1 tablet by mouth daily.   Yes Historical Provider, MD  tiZANidine (ZANAFLEX) 4 MG tablet Take 4 mg by mouth 3 (three) times daily. Time 10 days   Yes Historical Provider, MD  torsemide (DEMADEX) 20 MG tablet Take 2 tablets (40 mg total) by mouth 2 (two) times daily. Resume on 10/19 Patient taking differently: Take 40 mg by mouth 2 (two) times daily.  03/08/15  Yes Kathie Dike, MD   amoxicillin (AMOXIL) 875 MG tablet Take 1 tablet (875 mg total) by mouth 2 (two) times daily. Patient not taking: Reported on 10/19/2015 10/07/15   Leta Baptist, MD  aspirin 325 MG tablet Take 650 mg by mouth daily as needed for mild pain.    Historical Provider, MD  cyclobenzaprine (FLEXERIL) 10 MG tablet Take 1 tablet (10 mg total) by mouth 3 (three) times daily as needed for muscle spasms. Patient not taking: Reported on 01/22/2016 08/31/12   Ashok Pall, MD  IRON PO Take 325 mg by mouth daily.     Historical Provider, MD  metoprolol tartrate (LOPRESSOR) 25 MG tablet Take 25 mg by mouth daily.    Historical Provider, MD  Polyethyl Glycol-Propyl Glycol (LUBRICANT EYE DROPS) 0.4-0.3 % SOLN Apply  1 drop to eye daily as needed (for lubricant eye drops).    Historical Provider, MD  POTASSIUM CITRATE PO Take 1 packet by mouth 2 (two) times daily.    Historical Provider, MD    Allergies as of 01/22/2016 - Review Complete 01/22/2016  Allergen Reaction Noted  . Aspirin Other (See Comments)   . Nexium [esomeprazole magnesium] Other (See Comments) 11/14/2013    Family History  Problem Relation Age of Onset  . Colon cancer Father 45    deceased  . Prostate cancer Father   . Pancreatic cancer Mother 68    deceased  . Prostate cancer Brother   . Arthritis Son   . Arthritis Son   . Diabetes Mellitus II Son     Social History   Social History  . Marital status: Widowed    Spouse name: Roger Kill  . Number of children: 5  . Years of education: College   Occupational History  . disability Retired   Social History Main Topics  . Smoking status: Former Smoker    Years: 8.00    Types: Cigars    Start date: 11/20/1959    Quit date: 08/10/1973  . Smokeless tobacco: Never Used  . Alcohol use 0.0 oz/week     Comment: 10/07/2015 "drank a little bit; stopped in ~ 1975"  . Drug use: No  . Sexual activity: No   Other Topics Concern  . Not on file   Social History Narrative   Patient lives at home  with spouse and son.   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        Review of Systems: See HPI, otherwise negative ROS  Physical Exam: BP (!) 175/92   Pulse 68   Temp 97.4 F (36.3 C) (Oral)   Ht 5\' 6"  (1.676 m)   Wt 282 lb 12.8 oz (128.3 kg)   BMI 45.65 kg/m  General:   Alert,   pleasant and cooperative in NAD Skin:  Intact without significant lesions or rashes. Eyes:  Sclera clear, no icterus.   Conjunctiva pink. Abdomen: obese. Positive bowel sounds soft and nontender without obvious mass or megaly   Impression:  Pleasant morbidly be 75 year old gentleman with GERD and chronic nausea-both symptoms well controlled on Protonix 40 mg daily in Ginger root.  Constipation well managed with when necessary Colace. Stool somewhat "pasty". Been doing well having 2-3 bowel movements weekly. History of multiple colonic adenomas removed was:  Had recommended a repeat colonoscopy in 2021.  Further surveillance may not be necessary given his current age and comorbidities.  Blood pressure elevated today.  Recommendations:  Continue Protonix 40 mg daily  May use Colace one or 2 tablets daily  Continue Ginger root as needed for nausea  Strive for continued weight loss  Office visit in 6 months  Have Dr. Gerarda Fraction recheck BP next week     Notice: This dictation was prepared with Dragon dictation along with smaller phrase technology. Any transcriptional errors that result from this process are unintentional and may not be corrected upon review.

## 2016-01-26 ENCOUNTER — Emergency Department (HOSPITAL_COMMUNITY): Payer: Medicare Other

## 2016-01-26 ENCOUNTER — Encounter (HOSPITAL_COMMUNITY): Payer: Self-pay | Admitting: Emergency Medicine

## 2016-01-26 ENCOUNTER — Emergency Department (HOSPITAL_COMMUNITY)
Admission: EM | Admit: 2016-01-26 | Discharge: 2016-01-26 | Disposition: A | Discharge status: Home / Self Care | Payer: Medicare Other | Attending: Emergency Medicine | Admitting: Emergency Medicine

## 2016-01-26 DIAGNOSIS — R51 Headache: Secondary | ICD-10-CM | POA: Insufficient documentation

## 2016-01-26 DIAGNOSIS — I959 Hypotension, unspecified: Secondary | ICD-10-CM | POA: Diagnosis not present

## 2016-01-26 DIAGNOSIS — I10 Essential (primary) hypertension: Secondary | ICD-10-CM

## 2016-01-26 DIAGNOSIS — Z79899 Other long term (current) drug therapy: Secondary | ICD-10-CM | POA: Diagnosis not present

## 2016-01-26 DIAGNOSIS — Z87891 Personal history of nicotine dependence: Secondary | ICD-10-CM | POA: Insufficient documentation

## 2016-01-26 DIAGNOSIS — E039 Hypothyroidism, unspecified: Secondary | ICD-10-CM | POA: Insufficient documentation

## 2016-01-26 DIAGNOSIS — M7989 Other specified soft tissue disorders: Secondary | ICD-10-CM | POA: Insufficient documentation

## 2016-01-26 DIAGNOSIS — Z7982 Long term (current) use of aspirin: Secondary | ICD-10-CM | POA: Insufficient documentation

## 2016-01-26 DIAGNOSIS — I110 Hypertensive heart disease with heart failure: Secondary | ICD-10-CM | POA: Diagnosis not present

## 2016-01-26 DIAGNOSIS — I5033 Acute on chronic diastolic (congestive) heart failure: Secondary | ICD-10-CM | POA: Diagnosis not present

## 2016-01-26 DIAGNOSIS — F17210 Nicotine dependence, cigarettes, uncomplicated: Secondary | ICD-10-CM | POA: Diagnosis not present

## 2016-01-26 DIAGNOSIS — R42 Dizziness and giddiness: Secondary | ICD-10-CM | POA: Diagnosis present

## 2016-01-26 DIAGNOSIS — R0682 Tachypnea, not elsewhere classified: Secondary | ICD-10-CM

## 2016-01-26 DIAGNOSIS — I2510 Atherosclerotic heart disease of native coronary artery without angina pectoris: Secondary | ICD-10-CM | POA: Insufficient documentation

## 2016-01-26 LAB — CBC WITH DIFFERENTIAL/PLATELET
BASOS PCT: 0 %
Basophils Absolute: 0 10*3/uL (ref 0.0–0.1)
EOS ABS: 0.2 10*3/uL (ref 0.0–0.7)
EOS PCT: 1 %
HEMATOCRIT: 42.8 % (ref 39.0–52.0)
HEMOGLOBIN: 13.8 g/dL (ref 13.0–17.0)
LYMPHS PCT: 7 %
Lymphs Abs: 1.3 10*3/uL (ref 0.7–4.0)
MCH: 25.4 pg — ABNORMAL LOW (ref 26.0–34.0)
MCHC: 32.2 g/dL (ref 30.0–36.0)
MCV: 78.8 fL (ref 78.0–100.0)
MONOS PCT: 10 %
Monocytes Absolute: 1.8 10*3/uL — ABNORMAL HIGH (ref 0.1–1.0)
NEUTROS PCT: 82 %
Neutro Abs: 14.9 10*3/uL — ABNORMAL HIGH (ref 1.7–7.7)
Platelets: 132 10*3/uL — ABNORMAL LOW (ref 150–400)
RBC: 5.43 MIL/uL (ref 4.22–5.81)
RDW: 18.4 % — AB (ref 11.5–15.5)
WBC: 18.2 10*3/uL — ABNORMAL HIGH (ref 4.0–10.5)

## 2016-01-26 LAB — URINALYSIS, ROUTINE W REFLEX MICROSCOPIC
Bilirubin Urine: NEGATIVE
Glucose, UA: NEGATIVE mg/dL
KETONES UR: NEGATIVE mg/dL
LEUKOCYTES UA: NEGATIVE
NITRITE: NEGATIVE
PH: 5.5 (ref 5.0–8.0)
PROTEIN: NEGATIVE mg/dL
Specific Gravity, Urine: 1.015 (ref 1.005–1.030)

## 2016-01-26 LAB — BASIC METABOLIC PANEL
ANION GAP: 5 (ref 5–15)
BUN: 16 mg/dL (ref 6–20)
CALCIUM: 9.2 mg/dL (ref 8.9–10.3)
CO2: 29 mmol/L (ref 22–32)
Chloride: 103 mmol/L (ref 101–111)
Creatinine, Ser: 1.09 mg/dL (ref 0.61–1.24)
GFR calc non Af Amer: >60 mL/min (ref >60)
Glucose, Bld: 141 mg/dL — ABNORMAL HIGH (ref 65–99)
Potassium: 4.2 mmol/L (ref 3.5–5.1)
Sodium: 137 mmol/L (ref 135–145)

## 2016-01-26 LAB — URINE MICROSCOPIC-ADD ON

## 2016-01-26 LAB — TROPONIN I

## 2016-01-26 MED ORDER — SODIUM CHLORIDE 0.9 % IV BOLUS (SEPSIS)
500.0000 mL | Freq: Once | INTRAVENOUS | Status: AC
  Administered 2016-01-26: 500 mL via INTRAVENOUS

## 2016-01-26 MED ORDER — SODIUM CHLORIDE 0.9 % IV SOLN: Freq: Once | INTRAVENOUS | Status: DC

## 2016-01-26 NOTE — ED Triage Notes (Addendum)
Pt reports elevation in his BP over the past week. States his granddaughter takes it daily. His BP this morning was 144/116. States that he did have some recent changes to his medication. Pt also c/o mild HA.

## 2016-01-26 NOTE — ED Provider Notes (Signed)
[facility] DEPT Provider Note   CSN: [id number] Arrival date & time: 01/26/16  [id number]  By signing my name below, I, Higinio Plan, attest that this documentation has been prepared under the direction and in the presence of Tanna Furry, MD . Electronically Signed: Higinio Plan, Scribe. 01/26/2016. 11:08 AM.  History   Chief Complaint Chief Complaint  Patient presents with  . Hypertension   The history is provided by the patient and a relative. No language interpreter was used.   HPI Comments: [name] [name] is a 75 y.o. male with PMHx of HTN, CHF, HLD and hypothyroidism, who presents to the Emergency Department complaining of gradually worsening, lightheadedness due to HTN that began this morning. Pt reports he feels "whoozy" and like he may pass out. Per son, pt's BP has been running high all week; he reports pt's BP upon arrival was 144/116. He notes pt was recently prescribed 10 mg amlodipine in exchange for his metropolol 4 days ago; he notes pt began taking it 2 days ago. Pt reports associated headache and leg swelling that he reports is normal for him. He denies shortness of breath.   Past Medical History:  Diagnosis Date  . Bilateral lower leg cellulitis   . BPH (benign prostatic hyperplasia)   . Cataract    left  . CHF (congestive heart failure) ([place])   . Chronic diastolic HF (heart failure) ([place])   . Collagen vascular disease ([place])    history of venous insufficency and LE cellulitis  . Complication of anesthesia    2012 due to sleep apnea pt has had difficulty being put under  and waking up from anesthesia  . Deep venous insufficiency   . GERD (gastroesophageal reflux disease)   . Glaucoma    left  . Headache   . Heel spur    right heel  . History of bladder infections   . History of colonic polyps   . Hyperlipidemia   . Hypertension    Sees Dr. Debara Pickett  . Hypothyroidism    hypothyroid denies no rx  . Kidney stones    passed them  . Obesity   . Osteoarthritis   . PONV  (postoperative nausea and vomiting)   . PUD (peptic ulcer disease)   . Ringing in ears   . S/P colonoscopy 2007, Feb and March 2012   hx of adenomas, left-sided diverticula, On 07/2010 TCS, fresh blood and clot noted coming from TI.  . S/P endoscopy 2007, 2012   2007: nl, May 2012: antral erosions  . SIRS (systemic inflammatory response syndrome) ([place]) 02/27/2012  . Sleep apnea    uses cpap  occ "unable to use lately due to nasal pain when use" - sleep study 2008 AHI 48.80/hr and 56.70hr during REM  . Thrombocytopenia ([place]) 12/30/2011   Stable    Patient Active Problem List   Diagnosis Date Noted  . S/P nasal septoplasty 10/07/2015  . Parotitis 03/06/2015  . Chronic diastolic CHF (congestive heart failure) ([place]) 03/06/2015  . Parotitis, acute 03/06/2015  . Injection site irritation 03/06/2015  . Nausea without vomiting 12/08/2014  . Dysphagia, pharyngoesophageal phase   . History of colonic polyps   . Hypokalemia 09/01/2013  . Dehydration 09/01/2013  . Chest pain 09/01/2013  . Leukocytosis 09/01/2013  . Right-sided heart failure ([place]) 08/20/2013  . Acute on chronic diastolic heart failure ([place]) 08/20/2013  . Acute on chronic diastolic CHF [id number]  . Diastolic dysfunction by echo Dec 2014 07/30/2013  . Morbid obesity- BMI 44.5 07/30/2013  .  Edema of both legs 07/30/2013  . Bilateral lower leg cellulitis 07/18/2013  . Pre-operative cardiovascular examination 06/12/2013  . 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 Right heart failure ([place]) 11/27/2012  . OSA (obstructive sleep apnea) 11/27/2012  . Other spondylosis with radiculopathy, lumbar region 08/22/2012  . Bright red blood per rectum 06/15/2012  . Constipation 02/29/2012  . Edema 02/29/2012  . SIRS (systemic inflammatory response syndrome) ([place]) 02/27/2012  . UTI (lower urinary tract infection) 02/27/2012  . BPH (benign prostatic hyperplasia) 02/27/2012  . Obesity 02/27/2012  . Hyperlipidemia 02/27/2012  .  Thrombocytopenia ([place]) 12/30/2011  . Hypothyroidism 10/27/2011  . Upper abdominal pain 10/27/2011  . Anorexia 10/27/2011  . Bowel habit changes 10/27/2011  . Constipation 04/20/2011  . Rectal bleeding 11/16/2010  . NAUSEA 06/08/2010  . DIARRHEA 06/08/2010  . COLONIC POLYPS, ADENOMATOUS, HX OF 07/16/2009  . Essential hypertension 06/19/2009  . GASTROESOPHAGEAL REFLUX DISEASE, CHRONIC 06/19/2009  . FATTY LIVER DISEASE 06/19/2009  . CHOLELITHIASIS, SYMPTOMATIC 06/19/2009  . RENAL INSUFFICIENCY 06/19/2009  . ABDOMINAL BLOATING 06/19/2009    Past Surgical History:  Procedure Laterality Date  . BACK SURGERY    . CARDIAC CATHETERIZATION  02/27/2007   no sign of CAD, LVH, mod pulm HTN (Dr. Jackie Plum(  . CATARACT EXTRACTION W/ INTRAOCULAR LENS IMPLANT Right ~ 2012  . COLONOSCOPY N/A 05/28/2014   [id number] colonic polyps/colonic diverticulosis. Tubular adenomas. Next colonoscopy January 2021  . ESOPHAGOGASTRODUODENOSCOPY  Aug 2013   [facility], single 94mm pedunculated polyp otherwise normal. HYPERPLASTIC, NEGATIVE h.pylori  . ESOPHAGOGASTRODUODENOSCOPY N/A 05/28/2014   RMR:non-critical Schatzki's ring/HH. Benign gastritis  . INGUINAL HERNIA REPAIR Right 1941  . KNEE ARTHROSCOPY Left ~ 2000  . LAPAROSCOPIC CHOLECYSTECTOMY    . [place] SURGERY  2012  . MALONEY DILATION N/A 05/28/2014   Procedure: Venia Minks DILATION;  Surgeon: Daneil Dolin, MD;  Location: [facility];  Service: Endoscopy;  Laterality: N/A;  . NASAL SEPTOPLASTY W/ TURBINOPLASTY Bilateral 10/07/2015   Procedure: NASAL SEPTOPLASTY WITH TURBINATE REDUCTION;  Surgeon: Leta Baptist, MD;  Location: [facility];  Service: ENT;  Laterality: Bilateral;  . NASAL SEPTUM SURGERY Bilateral 10/07/2015    inferior turbinate resection  . NM MYOCAR PERF WALL MOTION  12/2006   dipyridamole myoview - stress images show mild perfusion defect in mid inferior walls with reversibility at rest; mild perfusion defect in mid inferolateral wall at stress with mild  defect reversibility, EF 62%, abnormal but low risk study   . POSTERIOR LUMBAR FUSION  2015  . SAVORY DILATION N/A 05/28/2014   Procedure: SAVORY DILATION;  Surgeon: Daneil Dolin, MD;  Location: [facility];  Service: Endoscopy;  Laterality: N/A;  . SHOULDER OPEN ROTATOR CUFF REPAIR Left early 2000s  . small bowel capsule study  07/2011   ?transient focal ischemia in distal small bowel to explain GI bleeding  . TRANSTHORACIC ECHOCARDIOGRAM  09/2008   EF 60-65%, mod conc LVH  . TRANSURETHRAL RESECTION OF PROSTATE    . TRANSURETHRAL RESECTION OF PROSTATE N/A 06/18/2013   Procedure: TRANSURETHRAL RESECTION OF THE PROSTATE (TURP);  Surgeon: Marissa Nestle, MD;  Location: [facility];  Service: Urology;  Laterality: N/A;    Home Medications    Prior to Admission medications   Medication Sig Start Date End Date Taking? Authorizing Provider  acetaminophen (TYLENOL) 325 MG tablet Take 650 mg by mouth every 6 (six) hours as needed.   Yes Historical Provider, MD  amLODipine (NORVASC) 5 MG tablet Take 5 mg by  mouth daily.   Yes Historical Provider, MD  aspirin 325 MG tablet Take 650 mg by mouth daily as needed for mild pain.   Yes Historical Provider, MD  bisacodyl (DULCOLAX) 5 MG EC tablet Take 5 mg by mouth daily as needed for moderate constipation.   Yes Historical Provider, MD  calcium citrate (CALCITRATE - DOSED IN MG ELEMENTAL CALCIUM) 950 MG tablet Take 200 mg of elemental calcium by mouth daily.   Yes Historical Provider, MD  Cholecalciferol (VITAMIN D3) 1000 units CAPS Take 1 capsule by mouth.   Yes Historical Provider, MD  docusate sodium (COLACE) 100 MG capsule Take 200 mg by mouth daily as needed for mild constipation. Reported on 09/11/2015   Yes Historical Provider, MD  dorzolamide-timolol (COSOPT) 22.3-6.8 MG/ML ophthalmic solution Place 1 drop into both eyes 2 (two) times daily. 12/17/14  Yes Historical Provider, MD  Ginger, Zingiber officinalis, (GINGER ROOT PO) Take 1 tablet by mouth  daily.    Yes Historical Provider, MD  IRON PO Take 325 mg by mouth daily.    Yes Historical Provider, MD  latanoprost (XALATAN) 0.005 % ophthalmic solution Place 1 drop into both eyes at bedtime.   Yes Historical Provider, MD  magnesium oxide (MAG-OX) 400 MG tablet Take 400 mg by mouth daily.   Yes Historical Provider, MD  metoprolol tartrate (LOPRESSOR) 25 MG tablet Take 25 mg by mouth daily.   Yes Historical Provider, MD  Oxycodone HCl 10 MG TABS Take 10 mg by mouth 4 (four) times daily as needed (pain).   Yes Historical Provider, MD  pantoprazole (PROTONIX) 40 MG tablet Take 40 mg by mouth 2 (two) times daily.   Yes Historical Provider, MD  Polyethyl Glycol-Propyl Glycol (LUBRICANT EYE DROPS) 0.4-0.3 % SOLN Apply 1 drop to eye daily as needed (for lubricant eye drops).   Yes Historical Provider, MD  POTASSIUM CITRATE PO Take 1 packet by mouth 2 (two) times daily.   Yes Historical Provider, MD  tiZANidine (ZANAFLEX) 4 MG tablet Take 4 mg by mouth 3 (three) times daily. Time 10 days   Yes Historical Provider, MD  torsemide (DEMADEX) 20 MG tablet Take 2 tablets (40 mg total) by mouth 2 (two) times daily. Resume on 10/19 Patient taking differently: Take 40 mg by mouth daily as needed (fluid).  03/08/15  Yes Kathie Dike, MD    Family History Family History  Problem Relation Age of Onset  . Colon cancer Father 35    deceased  . Prostate cancer Father   . Pancreatic cancer Mother 47    deceased  . Prostate cancer Brother   . Arthritis Son   . Arthritis Son   . Diabetes Mellitus II Son     Social History Social History  Substance Use Topics  . Smoking status: Former Smoker    Years: 8.00    Types: Cigars    Start date: 11/20/1959    Quit date: 08/10/1973  . Smokeless tobacco: Never Used  . Alcohol use 0.0 oz/week     Comment: 10/07/2015 "drank a little bit; stopped in ~ 1975"   Allergies   Aspirin and Nexium [esomeprazole magnesium]  Review of Systems Review of Systems    Constitutional: Negative for appetite change, chills, diaphoresis, fatigue and fever.  HENT: Negative for mouth sores, sore throat and trouble swallowing.   Eyes: Negative for visual disturbance.  Respiratory: Negative for cough, chest tightness, shortness of breath and wheezing.   Cardiovascular: Positive for leg swelling. Negative for chest pain.  Gastrointestinal: Negative for abdominal distention, abdominal pain, diarrhea, nausea and vomiting.  Endocrine: Negative for polydipsia, polyphagia and polyuria.  Genitourinary: Negative for dysuria, frequency and hematuria.  Musculoskeletal: Negative for gait problem.  Skin: Negative for color change, pallor and rash.  Neurological: Positive for light-headedness and headaches. Negative for dizziness and syncope.  Hematological: Does not bruise/bleed easily.  Psychiatric/Behavioral: Negative for behavioral problems and confusion.   Physical Exam Updated Vital Signs BP 92/60 (BP Location: Left Arm)   Pulse 75   Temp 98 F (36.7 C) (Oral)   Resp 20   Ht 5\' 6"  (1.676 m)   Wt 278 lb (126.1 kg)   SpO2 (!) 88%   BMI 44.87 kg/m   Physical Exam  Constitutional: He is oriented to person, place, and time. He appears well-developed and well-nourished. No distress.  HENT:  Head: Normocephalic.  Eyes: Conjunctivae are normal. Pupils are equal, round, and reactive to light. No scleral icterus.  Xanthoma to both eyelids  Neck: Normal range of motion. Neck supple. No thyromegaly present.  Cardiovascular: Normal rate and regular rhythm.  Exam reveals no gallop and no friction rub.   No murmur heard. Pulmonary/Chest: Effort normal and breath sounds normal. No respiratory distress. He has no wheezes. He has no rales.  Abdominal: Soft. Bowel sounds are normal. He exhibits no distension. There is no tenderness. There is no rebound.  Musculoskeletal: Normal range of motion.  2+ symmetric lower extremity edema  Neurological: He is alert and oriented to  person, place, and time.  Skin: Skin is warm and dry. No rash noted.  Psychiatric: He has a normal mood and affect. His behavior is normal.   ED Treatments / Results  Labs (all labs ordered are listed, but only abnormal results are displayed) Labs Reviewed  CBC WITH DIFFERENTIAL/PLATELET - Abnormal; Notable for the following:       Result Value   WBC 18.2 (*)    MCH 25.4 (*)    RDW 18.4 (*)    Platelets 132 (*)    Neutro Abs 14.9 (*)    Monocytes Absolute 1.8 (*)    All other components within normal limits  BASIC METABOLIC PANEL - Abnormal; Notable for the following:    Glucose, Bld 141 (*)    All other components within normal limits  URINALYSIS, ROUTINE W REFLEX MICROSCOPIC (NOT AT [facility]) - Abnormal; Notable for the following:    APPearance HAZY (*)    Hgb urine dipstick SMALL (*)    All other components within normal limits  URINE MICROSCOPIC-ADD ON - Abnormal; Notable for the following:    Squamous Epithelial / LPF 6-30 (*)    Bacteria, UA FEW (*)    All other components within normal limits  TROPONIN I    EKG  EKG Interpretation  Date/Time:  Tuesday January 26 2016 10:36:08 EDT Ventricular Rate:  69 PR Interval:    QRS Duration: 89 QT Interval:  401 QTC Calculation: 430 R Axis:   -14 Text Interpretation:  Sinus rhythm Abnormal R-wave progression, early transition Confirmed by Jeneen Rinks  MD, [place] ([id number]) on 01/26/2016 1:00:48 PM       Radiology Dg Chest 2 View  Result Date: 01/26/2016 CLINICAL DATA:  Increasing lightheadedness since this morning. EXAM: CHEST  2 VIEW COMPARISON:  Single-view of the chest 01/26/2016. PA and lateral chest 10/19/2015. FINDINGS: Mild subsegmental atelectasis is seen in the left lung base. The lungs are otherwise clear. Heart size is normal. No pneumothorax or pleural effusion. Aortic atherosclerosis  is noted. IMPRESSION: No acute disease. Atherosclerosis. Electronically Signed   By: Inge Rise M.D.   On: 01/26/2016 13:08   Dg Chest  Port 1 View  Result Date: 01/26/2016 CLINICAL DATA:  History of CHF, hypertension, former smoker. Recent elevation in blood pressure. EXAM: PORTABLE CHEST 1 VIEW COMPARISON:  PA and lateral chest x-ray of Oct 19, 2015. FINDINGS: The lungs are hypoinflated. The pulmonary vascularity is engorged. The interstitial markings are mildly prominent. There may be small bilateral pleural effusions. The cardiac silhouette is enlarged where visualized. There is calcification in the wall of the aortic arch. IMPRESSION: The study is limited due to hypoinflation. The findings are consistent with CHF with mild interstitial edema and possible small bilateral pleural effusions. Electronically Signed   By: David  [place]ique M.D.   On: 01/26/2016 11:09    Procedures Procedures  DIAGNOSTIC STUDIES:  Oxygen Saturation is 88% on RA, low by my interpretation.    COORDINATION OF CARE:  10:55 AM Discussed treatment plan with pt and son at bedside and they agreed to plan.  Medications Ordered in ED Medications  0.9 %  sodium chloride infusion ( Intravenous Rate/Dose Change 01/26/16 1146)  sodium chloride 0.9 % bolus 500 mL (0 mLs Intravenous Stopped 01/26/16 1146)     Initial Impression / Assessment and Plan / ED Course  I have reviewed the triage vital signs and the nursing notes.  Pertinent labs & imaging results that were available during my care of the patient were reviewed by me and considered in my medical decision making (see chart for details).  Clinical Course   Patient given normal saline 1 L and his blood pressure consistently improves. Most recent check 126/71. I have cautioned him against using any additional amlodipine as he seems to be quite sensitive to it. Patient's main reason for coming were his elevated blood pressures at home. However he presents here after having taken his blood pressure medication and his low and requires fluids. He awakened from sleeping here on my reevaluation complained of some  left-sided discomfort in his chest and states it was "where I was laying on it" this resolved within a few minutes. Repeat EKG shows no acute or ischemic changes. Does show a single dropped P-wave consistent with a Mobitz 2 block but shows no additional block on his ongoing monitor strips. Troponin pending. I think is appropriate for discharge home. The plan will be to have him hold his amlodipine and resume his Lopressor and contact his physician regarding blood pressure medication advice  I personally performed the services described in this documentation, which was scribed in my presence. The recorded information has been reviewed and is accurate.   Final Clinical Impressions(s) / ED Diagnoses   Final diagnoses:  Hypotension, unspecified hypotension type  Essential hypertension    New Prescriptions New Prescriptions   No medications on file     Tanna Furry, MD 01/26/16 1446

## 2016-01-26 NOTE — ED Notes (Signed)
Rest of fluids at bolus per vo dr Jeneen Rinks

## 2016-01-26 NOTE — ED Notes (Signed)
Pt sat up a little and sats up to 92%. Pt denies SOB but has mild labored breathing and tachypnea noted.

## 2016-01-26 NOTE — ED Notes (Signed)
On RT arm BP, 84/50 (repeated x 2). On LT arm, BP 126/39. Pt falling asleep during triage. RN notified at bedside.

## 2016-01-26 NOTE — Discharge Instructions (Signed)
Your blood pressure today on arrival was low. This very likely because of your new blood pressure medicine, amlodipine.  Stop taking amlodipine.   Check and record your blood pressure for the next few days morning and evening and contact Dr. Riley Kill for further direction regarding your medications

## 2016-01-26 NOTE — ED Notes (Signed)
Pt returned from xray

## 2016-01-26 NOTE — ED Notes (Signed)
Pt taken to xray 

## 2016-01-27 ENCOUNTER — Emergency Department (HOSPITAL_COMMUNITY)
Admission: EM | Admit: 2016-01-27 | Discharge: 2016-01-27 | Disposition: A | Discharge status: Home / Self Care | Payer: Medicare Other | Attending: Emergency Medicine | Admitting: Emergency Medicine

## 2016-01-27 ENCOUNTER — Encounter (HOSPITAL_COMMUNITY): Payer: Self-pay | Admitting: *Deleted

## 2016-01-27 DIAGNOSIS — Z87891 Personal history of nicotine dependence: Secondary | ICD-10-CM | POA: Insufficient documentation

## 2016-01-27 DIAGNOSIS — I2510 Atherosclerotic heart disease of native coronary artery without angina pectoris: Secondary | ICD-10-CM | POA: Insufficient documentation

## 2016-01-27 DIAGNOSIS — I110 Hypertensive heart disease with heart failure: Secondary | ICD-10-CM | POA: Insufficient documentation

## 2016-01-27 DIAGNOSIS — I10 Essential (primary) hypertension: Secondary | ICD-10-CM | POA: Diagnosis not present

## 2016-01-27 DIAGNOSIS — I5033 Acute on chronic diastolic (congestive) heart failure: Secondary | ICD-10-CM | POA: Insufficient documentation

## 2016-01-27 DIAGNOSIS — Z7982 Long term (current) use of aspirin: Secondary | ICD-10-CM | POA: Diagnosis not present

## 2016-01-27 DIAGNOSIS — E039 Hypothyroidism, unspecified: Secondary | ICD-10-CM | POA: Insufficient documentation

## 2016-01-27 DIAGNOSIS — Z79899 Other long term (current) drug therapy: Secondary | ICD-10-CM | POA: Diagnosis not present

## 2016-01-27 NOTE — ED Triage Notes (Signed)
Pt was seen here yesterday for HTN. Per pt, he was told to stop taking his BP medication and follow up with doctor. Pt states he is getting high readings on his machine at home. Pt blood pressure stable in triage. He states he is having a headache and left side numbness today. Pt can move both extremities without difficulty in triage, states the feel dull. This started yesterday while here. Pt states he told staff.

## 2016-01-27 NOTE — Discharge Instructions (Signed)
Follow up your primary care dr tomorrow

## 2016-01-27 NOTE — ED Provider Notes (Signed)
[facility] DEPT Provider Note   CSN: [id number] Arrival date & time: 01/27/16  1256     History   Chief Complaint Chief Complaint  Patient presents with  . Hypertension    HPI [name] [name] is a 75 y.o. male.  Patient is concerned about his blood pressure being too high. This morning before taking his medication, his systolic was [id number].   Diastolic was [id number].  These readings were before taking his blood pressure medicine metoprolol extended release 25 mg. He was in the ED yesterday with hypotension and given IV fluids. At that time, it was recommended to hold his blood pressure medicine which was Norvasc. Patient complains of intermittent vague left-sided chest pain which is not new. No true substernal chest pain, dyspnea, diaphoresis, nausea.      Past Medical History:  Diagnosis Date  . Bilateral lower leg cellulitis   . BPH (benign prostatic hyperplasia)   . Cataract    left  . CHF (congestive heart failure) ([place])   . Chronic diastolic HF (heart failure) ([place])   . Collagen vascular disease ([place])    history of venous insufficency and LE cellulitis  . Complication of anesthesia    2012 due to sleep apnea pt has had difficulty being put under  and waking up from anesthesia  . Deep venous insufficiency   . GERD (gastroesophageal reflux disease)   . Glaucoma    left  . Headache   . Heel spur    right heel  . History of bladder infections   . History of colonic polyps   . Hyperlipidemia   . Hypertension    Sees Dr. Debara Pickett  . Hypothyroidism    hypothyroid denies no rx  . Kidney stones    passed them  . Obesity   . Osteoarthritis   . PONV (postoperative nausea and vomiting)   . PUD (peptic ulcer disease)   . Ringing in ears   . S/P colonoscopy 2007, Feb and March 2012   hx of adenomas, left-sided diverticula, On 07/2010 TCS, fresh blood and clot noted coming from TI.  . S/P endoscopy 2007, 2012   2007: nl, May 2012: antral erosions  . SIRS (systemic  inflammatory response syndrome) ([place]) 02/27/2012  . Sleep apnea    uses cpap  occ "unable to use lately due to nasal pain when use" - sleep study 2008 AHI 48.80/hr and 56.70hr during REM  . Thrombocytopenia ([place]) 12/30/2011   Stable    Patient Active Problem List   Diagnosis Date Noted  . S/P nasal septoplasty 10/07/2015  . Parotitis 03/06/2015  . Chronic diastolic CHF (congestive heart failure) ([place]) 03/06/2015  . Parotitis, acute 03/06/2015  . Injection site irritation 03/06/2015  . Nausea without vomiting 12/08/2014  . Dysphagia, pharyngoesophageal phase   . History of colonic polyps   . Hypokalemia 09/01/2013  . Dehydration 09/01/2013  . Chest pain 09/01/2013  . Leukocytosis 09/01/2013  . Right-sided heart failure ([place]) 08/20/2013  . Acute on chronic diastolic heart failure ([place]) 08/20/2013  . Acute on chronic diastolic CHF [id number]  . Diastolic dysfunction by echo Dec 2014 07/30/2013  . Morbid obesity- BMI 44.5 07/30/2013  . Edema of both legs 07/30/2013  . Bilateral lower leg cellulitis 07/18/2013  . Pre-operative cardiovascular examination 06/12/2013  . 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 Right heart failure ([place]) 11/27/2012  . OSA (obstructive sleep apnea) 11/27/2012  . Other spondylosis with radiculopathy, lumbar region 08/22/2012  . Bright red blood per rectum  06/15/2012  . Constipation 02/29/2012  . Edema 02/29/2012  . SIRS (systemic inflammatory response syndrome) ([place]) 02/27/2012  . UTI (lower urinary tract infection) 02/27/2012  . BPH (benign prostatic hyperplasia) 02/27/2012  . Obesity 02/27/2012  . Hyperlipidemia 02/27/2012  . Thrombocytopenia ([place]) 12/30/2011  . Hypothyroidism 10/27/2011  . Upper abdominal pain 10/27/2011  . Anorexia 10/27/2011  . Bowel habit changes 10/27/2011  . Constipation 04/20/2011  . Rectal bleeding 11/16/2010  . NAUSEA 06/08/2010  . DIARRHEA 06/08/2010  . COLONIC POLYPS, ADENOMATOUS, HX OF 07/16/2009  . Essential  hypertension 06/19/2009  . GASTROESOPHAGEAL REFLUX DISEASE, CHRONIC 06/19/2009  . FATTY LIVER DISEASE 06/19/2009  . CHOLELITHIASIS, SYMPTOMATIC 06/19/2009  . RENAL INSUFFICIENCY 06/19/2009  . ABDOMINAL BLOATING 06/19/2009    Past Surgical History:  Procedure Laterality Date  . BACK SURGERY    . CARDIAC CATHETERIZATION  02/27/2007   no sign of CAD, LVH, mod pulm HTN (Dr. Jackie Plum(  . CATARACT EXTRACTION W/ INTRAOCULAR LENS IMPLANT Right ~ 2012  . COLONOSCOPY N/A 05/28/2014   [id number] colonic polyps/colonic diverticulosis. Tubular adenomas. Next colonoscopy January 2021  . ESOPHAGOGASTRODUODENOSCOPY  Aug 2013   [facility], single 51mm pedunculated polyp otherwise normal. HYPERPLASTIC, NEGATIVE h.pylori  . ESOPHAGOGASTRODUODENOSCOPY N/A 05/28/2014   RMR:non-critical Schatzki's ring/HH. Benign gastritis  . INGUINAL HERNIA REPAIR Right 1941  . KNEE ARTHROSCOPY Left ~ 2000  . LAPAROSCOPIC CHOLECYSTECTOMY    . [place] SURGERY  2012  . MALONEY DILATION N/A 05/28/2014   Procedure: Venia Minks DILATION;  Surgeon: Daneil Dolin, MD;  [facility];  Service: Endoscopy;  Laterality: N/A;  . NASAL SEPTOPLASTY W/ TURBINOPLASTY Bilateral 10/07/2015   Procedure: NASAL SEPTOPLASTY WITH TURBINATE REDUCTION;  Surgeon: Leta Baptist, MD;  Location: [facility];  Service: ENT;  Laterality: Bilateral;  . NASAL SEPTUM SURGERY Bilateral 10/07/2015    inferior turbinate resection  . NM MYOCAR PERF WALL MOTION  12/2006   dipyridamole myoview - stress images show mild perfusion defect in mid inferior walls with reversibility at rest; mild perfusion defect in mid inferolateral wall at stress with mild defect reversibility, EF 62%, abnormal but low risk study   . POSTERIOR LUMBAR FUSION  2015  . SAVORY DILATION N/A 05/28/2014   Procedure: SAVORY DILATION;  Surgeon: Daneil Dolin, MD;  [facility];  Service: Endoscopy;  Laterality: N/A;  . SHOULDER OPEN ROTATOR CUFF REPAIR Left early 2000s  . small bowel capsule  study  07/2011   ?transient focal ischemia in distal small bowel to explain GI bleeding  . TRANSTHORACIC ECHOCARDIOGRAM  09/2008   EF 60-65%, mod conc LVH  . TRANSURETHRAL RESECTION OF PROSTATE    . TRANSURETHRAL RESECTION OF PROSTATE N/A 06/18/2013   Procedure: TRANSURETHRAL RESECTION OF THE PROSTATE (TURP);  Surgeon: Marissa Nestle, MD;  Location: [facility];  Service: Urology;  Laterality: N/A;       Home Medications    Prior to Admission medications   Medication Sig Start Date End Date Taking? Authorizing Provider  acetaminophen (TYLENOL) 325 MG tablet Take 650 mg by mouth every 6 (six) hours as needed.   Yes Historical Provider, MD  amLODipine (NORVASC) 5 MG tablet Take 5 mg by mouth daily.   Yes Historical Provider, MD  aspirin 325 MG tablet Take 650 mg by mouth daily as needed for mild pain.   Yes Historical Provider, MD  bisacodyl (DULCOLAX) 5 MG EC tablet Take 5 mg by mouth daily as needed for moderate constipation.   Yes Historical Provider, MD  calcium citrate (CALCITRATE - DOSED IN MG ELEMENTAL CALCIUM) 950 MG tablet Take 200 mg of elemental calcium by mouth daily.   Yes Historical Provider, MD  Cholecalciferol (VITAMIN D3) 1000 units CAPS Take 1 capsule by mouth.   Yes Historical Provider, MD  docusate sodium (COLACE) 100 MG capsule Take 200 mg by mouth daily as needed for mild constipation. Reported on 09/11/2015   Yes Historical Provider, MD  dorzolamide-timolol (COSOPT) 22.3-6.8 MG/ML ophthalmic solution Place 1 drop into both eyes 2 (two) times daily. 12/17/14  Yes Historical Provider, MD  Ginger, Zingiber officinalis, (GINGER ROOT PO) Take 1 tablet by mouth daily.    Yes Historical Provider, MD  IRON PO Take 325 mg by mouth daily.    Yes Historical Provider, MD  latanoprost (XALATAN) 0.005 % ophthalmic solution Place 1 drop into both eyes at bedtime.   Yes Historical Provider, MD  magnesium oxide (MAG-OX) 400 MG tablet Take 400 mg by mouth daily.   Yes Historical Provider, MD    metoprolol tartrate (LOPRESSOR) 25 MG tablet Take 25 mg by mouth daily.   Yes Historical Provider, MD  Oxycodone HCl 10 MG TABS Take 10 mg by mouth 4 (four) times daily as needed (pain).   Yes Historical Provider, MD  pantoprazole (PROTONIX) 40 MG tablet Take 40 mg by mouth 2 (two) times daily.   Yes Historical Provider, MD  Polyethyl Glycol-Propyl Glycol (LUBRICANT EYE DROPS) 0.4-0.3 % SOLN Apply 1 drop to eye daily as needed (for lubricant eye drops).   Yes Historical Provider, MD  POTASSIUM CITRATE PO Take 1 packet by mouth 2 (two) times daily.   Yes Historical Provider, MD  tiZANidine (ZANAFLEX) 4 MG tablet Take 4 mg by mouth 3 (three) times daily. Time 10 days   Yes Historical Provider, MD  torsemide (DEMADEX) 20 MG tablet Take 2 tablets (40 mg total) by mouth 2 (two) times daily. Resume on 10/19 Patient taking differently: Take 40 mg by mouth daily as needed (fluid).  03/08/15  Yes Kathie Dike, MD    Family History Family History  Problem Relation Age of Onset  . Colon cancer Father 75    deceased  . Prostate cancer Father   . Pancreatic cancer Mother 74    deceased  . Prostate cancer Brother   . Arthritis Son   . Arthritis Son   . Diabetes Mellitus II Son     Social History Social History  Substance Use Topics  . Smoking status: Former Smoker    Years: 8.00    Types: Cigars    Start date: 11/20/1959    Quit date: 08/10/1973  . Smokeless tobacco: Never Used  . Alcohol use 0.0 oz/week     Comment: 10/07/2015 "drank a little bit; stopped in ~ 1975"     Allergies   Aspirin and Nexium [esomeprazole magnesium]   Review of Systems Review of Systems  All other systems reviewed and are negative.    Physical Exam Updated Vital Signs BP 162/84 (BP Location: Left Arm)   Pulse 76   Temp 98.3 F (36.8 C) (Oral)   Resp 20   Ht 5\' 6"  (1.676 m)   Wt 278 lb (126.1 kg)   SpO2 94%   BMI 44.87 kg/m   Physical Exam  Constitutional: He is oriented to person, place, and  time. He appears well-developed and well-nourished.  Obese, no acute distress, blood pressure 144/87  HENT:  Head: Normocephalic and atraumatic.  Eyes: Conjunctivae are normal.  Neck: Neck supple.  Cardiovascular: Normal rate and regular rhythm.   Pulmonary/Chest: Effort normal and breath sounds normal.  Abdominal: Soft. Bowel sounds are normal.  Musculoskeletal: Normal range of motion.  Neurological: He is alert and oriented to person, place, and time.  Skin: Skin is warm and dry.  Psychiatric: He has a normal mood and affect. His behavior is normal.  Nursing note and vitals reviewed.    ED Treatments / Results  Labs (all labs ordered are listed, but only abnormal results are displayed) Labs Reviewed - No data to display  EKG  EKG Interpretation None       Radiology Dg Chest 2 View  Result Date: 01/26/2016 CLINICAL DATA:  Increasing lightheadedness since this morning. EXAM: CHEST  2 VIEW COMPARISON:  Single-view of the chest 01/26/2016. PA and lateral chest 10/19/2015. FINDINGS: Mild subsegmental atelectasis is seen in the left lung base. The lungs are otherwise clear. Heart size is normal. No pneumothorax or pleural effusion. Aortic atherosclerosis is noted. IMPRESSION: No acute disease. Atherosclerosis. Electronically Signed   By: Inge Rise M.D.   On: 01/26/2016 13:08   Dg Chest Port 1 View  Result Date: 01/26/2016 CLINICAL DATA:  History of CHF, hypertension, former smoker. Recent elevation in blood pressure. EXAM: PORTABLE CHEST 1 VIEW COMPARISON:  PA and lateral chest x-ray of Oct 19, 2015. FINDINGS: The lungs are hypoinflated. The pulmonary vascularity is engorged. The interstitial markings are mildly prominent. There may be small bilateral pleural effusions. The cardiac silhouette is enlarged where visualized. There is calcification in the wall of the aortic arch. IMPRESSION: The study is limited due to hypoinflation. The findings are consistent with CHF with mild  interstitial edema and possible small bilateral pleural effusions. Electronically Signed   By: David  Martinique M.D.   On: 01/26/2016 11:09    Procedures Procedures (including critical care time)  Medications Ordered in ED Medications - No data to display   Initial Impression / Assessment and Plan / ED Course  I have reviewed the triage vital signs and the nursing notes.  Pertinent labs & imaging results that were available during my care of the patient were reviewed by me and considered in my medical decision making (see chart for details).  Clinical Course    Patient appears stable today. I'm reluctant to alter his blood pressure medicine once again. He has a primary care visit tomorrow. This was discussed with the patient and his son.  Final Clinical Impressions(s) / ED Diagnoses   Final diagnoses:  Essential hypertension    New Prescriptions New Prescriptions   No medications on file     Nat Christen, MD 01/27/16 [phone]

## 2016-01-28 DIAGNOSIS — R079 Chest pain, unspecified: Secondary | ICD-10-CM | POA: Diagnosis not present

## 2016-01-28 DIAGNOSIS — I10 Essential (primary) hypertension: Secondary | ICD-10-CM | POA: Diagnosis not present

## 2016-01-28 DIAGNOSIS — Z6841 Body Mass Index (BMI) 40.0 and over, adult: Secondary | ICD-10-CM | POA: Diagnosis not present

## 2016-01-28 DIAGNOSIS — R51 Headache: Secondary | ICD-10-CM | POA: Diagnosis not present

## 2016-01-29 ENCOUNTER — Encounter: Payer: Self-pay | Admitting: Cardiology

## 2016-01-29 ENCOUNTER — Ambulatory Visit (INDEPENDENT_AMBULATORY_CARE_PROVIDER_SITE_OTHER): Payer: Medicare Other | Admitting: Cardiology

## 2016-01-29 ENCOUNTER — Ambulatory Visit (HOSPITAL_COMMUNITY)
Admission: RE | Admit: 2016-01-29 | Discharge: 2016-01-29 | Disposition: A | Discharge status: Home / Self Care | Payer: Medicare Other | Source: Ambulatory Visit | Attending: Physical Medicine and Rehabilitation | Admitting: Physical Medicine and Rehabilitation

## 2016-01-29 VITALS — BP 150/84 | HR 91 | Ht 66.0 in | Wt 282.0 lb

## 2016-01-29 DIAGNOSIS — I5032 Chronic diastolic (congestive) heart failure: Secondary | ICD-10-CM

## 2016-01-29 DIAGNOSIS — M961 Postlaminectomy syndrome, not elsewhere classified: Secondary | ICD-10-CM | POA: Diagnosis not present

## 2016-01-29 DIAGNOSIS — Z9889 Other specified postprocedural states: Secondary | ICD-10-CM | POA: Insufficient documentation

## 2016-01-29 DIAGNOSIS — M545 Low back pain: Secondary | ICD-10-CM | POA: Diagnosis present

## 2016-01-29 DIAGNOSIS — I10 Essential (primary) hypertension: Secondary | ICD-10-CM

## 2016-01-29 DIAGNOSIS — E785 Hyperlipidemia, unspecified: Secondary | ICD-10-CM

## 2016-01-29 DIAGNOSIS — M5126 Other intervertebral disc displacement, lumbar region: Secondary | ICD-10-CM | POA: Diagnosis not present

## 2016-01-29 DIAGNOSIS — M4806 Spinal stenosis, lumbar region: Secondary | ICD-10-CM | POA: Diagnosis not present

## 2016-01-29 NOTE — Patient Instructions (Addendum)
Medication Instructions:  Continue all current medications.  Labwork: none  Testing/Procedures: none  Follow-Up: 3 months   Any Other Special Instructions Will Be Listed Below (If Applicable). Your physician has requested that you regularly monitor and record your blood pressure readings at home.  Please give a call early next week to update on readings.  If you need a refill on your cardiac medications before your next appointment, please call your pharmacy.

## 2016-01-29 NOTE — Progress Notes (Signed)
Clinical Summary Mr. Reichl is a 75 y.o.male seen today for follow up of the following medical problems.   1. Chronic diastolic heart failure  - echo 04/2013 LVEF [id number], grade I diastolic dysfunction. RV was poorly visualized.  - previously volume status was labile, admissions with volume overload and dehydration. Has done well on torsemide 40mg  bid, his weights remain stable in the low 280s. He adjusts he diuretic dosing based on daily weight changes.  - weights at home stable, 282 on home scale.  - denies any significant SOB or DOE.  - he is taking torsemide 20mg  daily  2. OSA - reports compliance with CPAP   3. Hyperlipidemia - compliant with statin  4. Labile HTN - seen in ER 01/26/16 with low bp's and dizziness. In Er bp's 90/60s. Improved with IVF.  - he had recently been started on norvasc 10mg  daily prior to hypotensive episode.  - he reports recent bp med changes by pcp just yesterday, but is unsure of what was changed.   Past Medical History:  Diagnosis Date  . Bilateral lower leg cellulitis   . BPH (benign prostatic hyperplasia)   . Cataract    left  . CHF (congestive heart failure) ([place])   . Chronic diastolic HF (heart failure) ([place])   . Collagen vascular disease ([place])    history of venous insufficency and LE cellulitis  . Complication of anesthesia    2012 due to sleep apnea pt has had difficulty being put under  and waking up from anesthesia  . Deep venous insufficiency   . GERD (gastroesophageal reflux disease)   . Glaucoma    left  . Headache   . Heel spur    right heel  . History of bladder infections   . History of colonic polyps   . Hyperlipidemia   . Hypertension    Sees Dr. Debara Pickett  . Hypothyroidism    hypothyroid denies no rx  . Kidney stones    passed them  . Obesity   . Osteoarthritis   . PONV (postoperative nausea and vomiting)   . PUD (peptic ulcer disease)   . Ringing in ears   . S/P colonoscopy 2007, Feb and March 2012     hx of adenomas, left-sided diverticula, On 07/2010 TCS, fresh blood and clot noted coming from TI.  . S/P endoscopy 2007, 2012   2007: nl, May 2012: antral erosions  . SIRS (systemic inflammatory response syndrome) ([place]) 02/27/2012  . Sleep apnea    uses cpap  occ "unable to use lately due to nasal pain when use" - sleep study 2008 AHI 48.80/hr and 56.70hr during REM  . Thrombocytopenia ([place]) 12/30/2011   Stable     Allergies  Allergen Reactions  . Aspirin Other (See Comments)    Nausea and upset stomach    . Nexium [Esomeprazole Magnesium] Other (See Comments)    Patient said it messed his stomach up     Current Outpatient Prescriptions  Medication Sig Dispense Refill  . acetaminophen (TYLENOL) 325 MG tablet Take 650 mg by mouth every 6 (six) hours as needed.    Marland Kitchen amLODipine (NORVASC) 5 MG tablet Take 5 mg by mouth daily.    Marland Kitchen aspirin 325 MG tablet Take 650 mg by mouth daily as needed for mild pain.    . bisacodyl (DULCOLAX) 5 MG EC tablet Take 5 mg by mouth daily as needed for moderate constipation.    . calcium citrate (CALCITRATE - DOSED IN  MG ELEMENTAL CALCIUM) 950 MG tablet Take 200 mg of elemental calcium by mouth daily.    . Cholecalciferol (VITAMIN D3) 1000 units CAPS Take 1 capsule by mouth.    . docusate sodium (COLACE) 100 MG capsule Take 200 mg by mouth daily as needed for mild constipation. Reported on 09/11/2015    . dorzolamide-timolol (COSOPT) 22.3-6.8 MG/ML ophthalmic solution Place 1 drop into both eyes 2 (two) times daily.    . Ginger, Zingiber officinalis, (GINGER ROOT PO) Take 1 tablet by mouth daily.     . IRON PO Take 325 mg by mouth daily.     Marland Kitchen latanoprost (XALATAN) 0.005 % ophthalmic solution Place 1 drop into both eyes at bedtime.    . magnesium oxide (MAG-OX) 400 MG tablet Take 400 mg by mouth daily.    . metoprolol tartrate (LOPRESSOR) 25 MG tablet Take 25 mg by mouth daily.    . Oxycodone HCl 10 MG TABS Take 10 mg by mouth 4 (four) times daily as  needed (pain).    . pantoprazole (PROTONIX) 40 MG tablet Take 40 mg by mouth 2 (two) times daily.    Vladimir Faster Glycol-Propyl Glycol (LUBRICANT EYE DROPS) 0.4-0.3 % SOLN Apply 1 drop to eye daily as needed (for lubricant eye drops).    Marland Kitchen POTASSIUM CITRATE PO Take 1 packet by mouth 2 (two) times daily.    Marland Kitchen tiZANidine (ZANAFLEX) 4 MG tablet Take 4 mg by mouth 3 (three) times daily. Time 10 days    . torsemide (DEMADEX) 20 MG tablet Take 2 tablets (40 mg total) by mouth 2 (two) times daily. Resume on 10/19 (Patient taking differently: Take 40 mg by mouth daily as needed (fluid). )     No current facility-administered medications for this visit.      Past Surgical History:  Procedure Laterality Date  . BACK SURGERY    . CARDIAC CATHETERIZATION  02/27/2007   no sign of CAD, LVH, mod pulm HTN (Dr. Jackie Plum(  . CATARACT EXTRACTION W/ INTRAOCULAR LENS IMPLANT Right ~ 2012  . COLONOSCOPY N/A 05/28/2014   [id number] colonic polyps/colonic diverticulosis. Tubular adenomas. Next colonoscopy January 2021  . ESOPHAGOGASTRODUODENOSCOPY  Aug 2013   [facility], single 65mm pedunculated polyp otherwise normal. HYPERPLASTIC, NEGATIVE h.pylori  . ESOPHAGOGASTRODUODENOSCOPY N/A 05/28/2014   RMR:non-critical Schatzki's ring/HH. Benign gastritis  . INGUINAL HERNIA REPAIR Right 1941  . KNEE ARTHROSCOPY Left ~ 2000  . LAPAROSCOPIC CHOLECYSTECTOMY    . [place] SURGERY  2012  . MALONEY DILATION N/A 05/28/2014   Procedure: Venia Minks DILATION;  Surgeon: Daneil Dolin, MD;  [facility];  Service: Endoscopy;  Laterality: N/A;  . NASAL SEPTOPLASTY W/ TURBINOPLASTY Bilateral 10/07/2015   Procedure: NASAL SEPTOPLASTY WITH TURBINATE REDUCTION;  Surgeon: Leta Baptist, MD;  Location: [facility];  Service: ENT;  Laterality: Bilateral;  . NASAL SEPTUM SURGERY Bilateral 10/07/2015    inferior turbinate resection  . NM MYOCAR PERF WALL MOTION  12/2006   dipyridamole myoview - stress images show mild perfusion defect in mid  inferior walls with reversibility at rest; mild perfusion defect in mid inferolateral wall at stress with mild defect reversibility, EF 62%, abnormal but low risk study   . POSTERIOR LUMBAR FUSION  2015  . SAVORY DILATION N/A 05/28/2014   Procedure: SAVORY DILATION;  Surgeon: Daneil Dolin, MD;  [facility];  Service: Endoscopy;  Laterality: N/A;  . SHOULDER OPEN ROTATOR CUFF REPAIR Left early 2000s  . small bowel capsule study  07/2011   ?transient focal ischemia in distal small bowel to explain GI bleeding  . TRANSTHORACIC ECHOCARDIOGRAM  09/2008   EF 60-65%, mod conc LVH  . TRANSURETHRAL RESECTION OF PROSTATE    . TRANSURETHRAL RESECTION OF PROSTATE N/A 06/18/2013   Procedure: TRANSURETHRAL RESECTION OF THE PROSTATE (TURP);  Surgeon: Marissa Nestle, MD;  Location: [facility];  Service: Urology;  Laterality: N/A;     Allergies  Allergen Reactions  . Aspirin Other (See Comments)    Nausea and upset stomach    . Nexium [Esomeprazole Magnesium] Other (See Comments)    Patient said it messed his stomach up      Family History  Problem Relation Age of Onset  . Colon cancer Father 89    deceased  . Prostate cancer Father   . Pancreatic cancer Mother 80    deceased  . Prostate cancer Brother   . Arthritis Son   . Arthritis Son   . Diabetes Mellitus II Son      Social History Mr. Devall reports that he quit smoking about 42 years ago. His smoking use included Cigars. He started smoking about 56 years ago. He quit after 8.00 years of use. He has never used smokeless tobacco. Mr. Dundee reports that he drinks alcohol.   Review of Systems CONSTITUTIONAL: No weight loss, fever, chills, weakness or fatigue.  HEENT: Eyes: No visual loss, blurred vision, double vision or yellow sclerae.No hearing loss, sneezing, congestion, runny nose or sore throat.  SKIN: No rash or itching.  CARDIOVASCULAR: per HPI RESPIRATORY: No shortness of breath, cough or sputum.   GASTROINTESTINAL: No anorexia, nausea, vomiting or diarrhea. No abdominal pain or blood.  GENITOURINARY: No burning on urination, no polyuria NEUROLOGICAL: No headache, dizziness, syncope, paralysis, ataxia, numbness or tingling in the extremities. No change in bowel or bladder control.  MUSCULOSKELETAL: No muscle, back pain, joint pain or stiffness.  LYMPHATICS: No enlarged nodes. No history of splenectomy.  PSYCHIATRIC: No history of depression or anxiety.  ENDOCRINOLOGIC: No reports of sweating, cold or heat intolerance. No polyuria or polydipsia.  Marland Kitchen   Physical Examination Vitals:   01/29/16 1413  BP: (!) 150/84  Pulse: 91   Vitals:   01/29/16 1413  Weight: 282 lb (127.9 kg)  Height: 5\' 6"  (1.676 m)    Gen: resting comfortably, no acute distress HEENT: no scleral icterus, pupils equal round and reactive, no palptable cervical adenopathy,  CV: RRR, no m/r/g, no jvd Resp: Clear to auscultation bilaterally GI: abdomen is soft, non-tender, non-distended, normal bowel sounds, no hepatosplenomegaly MSK: extremities are warm, no edema.  Skin: warm, no rash Neuro:  no focal deficits Psych: appropriate affect   Diagnostic Studies 04/2013 Echo Study Conclusions  - Procedure narrative: Transthoracic echocardiography. Technically difficult study with reduced echocardiographic windows. - Left ventricle: The cavity size was normal. There was moderate concentric hypertrophy. Systolic function was vigorous. The estimated ejection fraction was in the range of 65% to 70%. Wall motion was normal; there were no regional wall motion abnormalities. Doppler parameters are consistent with abnormal left ventricular relaxation (grade 1 diastolic dysfunction). The E/e' ratio is <10, suggesting normal LV filling pressure. - Left atrium: The atrium was normal in size. - Tricuspid valve: Mild regurgitation. - Pericardium, extracardiac: There was no pericardial effusion.     Assessment  and Plan   1. Chronic diastolic heart failure  - volume status well controlled, we will continue current diuretics.    2. OSA - continue CPAP  3.  Hyperlipidemia - continue current statin  4. HTN - recently labile, we will request notes from pcp to see what changes were recently made as patient is unsure. He will submit bp log for [name] in 1 week.   F/u 3 months     Arnoldo Lenis, M.D.

## 2016-02-01 DIAGNOSIS — H401133 Primary open-angle glaucoma, bilateral, severe stage: Secondary | ICD-10-CM | POA: Diagnosis not present

## 2016-02-03 ENCOUNTER — Encounter: Payer: Self-pay | Admitting: *Deleted

## 2016-02-05 ENCOUNTER — Ambulatory Visit (INDEPENDENT_AMBULATORY_CARE_PROVIDER_SITE_OTHER): Payer: Medicare Other | Admitting: Neurology

## 2016-02-05 ENCOUNTER — Encounter: Payer: Self-pay | Admitting: Neurology

## 2016-02-05 VITALS — BP 148/80 | HR 102 | Ht 66.0 in | Wt 277.5 lb

## 2016-02-05 DIAGNOSIS — M5116 Intervertebral disc disorders with radiculopathy, lumbar region: Secondary | ICD-10-CM

## 2016-02-05 DIAGNOSIS — G5622 Lesion of ulnar nerve, left upper limb: Secondary | ICD-10-CM | POA: Diagnosis not present

## 2016-02-05 DIAGNOSIS — H532 Diplopia: Secondary | ICD-10-CM

## 2016-02-05 NOTE — Patient Instructions (Signed)
1.  Recommend starting physical therapy 2.  Avoid resting your elbow on hard surfaces 3.  You can start using a soft elbow pad to prevent compression of the nerve

## 2016-02-05 NOTE — Progress Notes (Signed)
Follow-up Visit   Date: 02/05/16    [name] [name] MRN: [id number] DOB: 1940-10-05   Interim History: [name] [name] is a 75 y.o. right-handed African American male with CHF, hyperlipidemia, hypertension, GERD, osteoarthritis, lumbar decompression and fusion with chronic low back pain, morbid obesity, and OSA returning to the clinic for follow-up.  He was last seen in the clinic on 07/11/2014.  The patient was accompanied to the clinic by son who also provides collateral information.    History of present illness Initial visit 07/11/2014:  He suffered a fall in October 2015 while getting out of bed and hit his head on the closet door.  He reports loosing consciousness for a minute.  He developed swelling over the left forehead and an abrasion to the left knee.  He was able to get up by himself and called his son who took him to the emergency department for evaluation.  CT head showed scalp hematoma.  US carotids showed < 50% stenosis.  Since the fall, he started having left sided headaches, described as sharp and throbbing.  Pain is improved by rest and percocet.  Headache were occuring daily, but now have improved to 1-2 times per week, lasting 1-3 hours.  He has noticed that headaches were worse if he sneezes, but this has also improved.  He reports having nausea with headaches and left face, arm and leg numbness.  Denies any dizziness or weakness.  He is most concerned that he is having a stroke.  Over the past 4-5 years, he has also history of ulnar neuropathy as per referring clinic notes.  He has had prior EMG, but we do not have these report.  He complains of intermittent left hand paresthesias involving the last three digits and hand weakness.  UPDATE 02/05/2016:  Overall, he is doing well and has no new complaints.   He is seeing Dr. Ace Gins for his low back pain and he received ESI which helped some.  His MRI lumbar spine shows disc herniation at L3-4 with moderate spinal stenosis.   Since he had cataract surgery, he has noticed double vision with images side-by-side.  This fluctuates and is not constant.  There is no diurnal variation.  He denies slurred speech, droopy eyes, weakness, or shortness of breath.  Her left hand paresthesias are somewhat improved and only occurred intermittently.  He does not have any hand weakness.  Medications:  Current Outpatient Prescriptions on File Prior to Visit  Medication Sig Dispense Refill  . acetaminophen (TYLENOL) 325 MG tablet Take 650 mg by mouth every 6 (six) hours as needed.    Marland Kitchen amLODipine (NORVASC) 5 MG tablet Take 5 mg by mouth daily.    Marland Kitchen aspirin 325 MG tablet Take 650 mg by mouth daily as needed for mild pain.    . bisacodyl (DULCOLAX) 5 MG EC tablet Take 5 mg by mouth daily as needed for moderate constipation.    . calcium citrate (CALCITRATE - DOSED IN MG ELEMENTAL CALCIUM) 950 MG tablet Take 200 mg of elemental calcium by mouth daily.    . Cholecalciferol (VITAMIN D3) 1000 units CAPS Take 1 capsule by mouth.    . docusate sodium (COLACE) 100 MG capsule Take 200 mg by mouth daily as needed for mild constipation. Reported on 09/11/2015    . dorzolamide-timolol (COSOPT) 22.3-6.8 MG/ML ophthalmic solution Place 1 drop into both eyes 2 (two) times daily.    . Ginger, Zingiber officinalis, (GINGER ROOT PO) Take 1 tablet by mouth daily.     Marland Kitchen  IRON PO Take 325 mg by mouth daily.     Marland Kitchen latanoprost (XALATAN) 0.005 % ophthalmic solution Place 1 drop into both eyes at bedtime.    . magnesium oxide (MAG-OX) 400 MG tablet Take 400 mg by mouth daily.    . metoprolol tartrate (LOPRESSOR) 25 MG tablet Take 25 mg by mouth daily.    . Oxycodone HCl 10 MG TABS Take 10 mg by mouth 4 (four) times daily as needed (pain).    . pantoprazole (PROTONIX) 40 MG tablet Take 40 mg by mouth 2 (two) times daily.    Vladimir Faster Glycol-Propyl Glycol (LUBRICANT EYE DROPS) 0.4-0.3 % SOLN Apply 1 drop to eye daily as needed (for lubricant eye drops).    Marland Kitchen  POTASSIUM CITRATE PO Take 1 packet by mouth 2 (two) times daily.    Marland Kitchen tiZANidine (ZANAFLEX) 4 MG tablet Take 4 mg by mouth 3 (three) times daily. Time 10 days    . torsemide (DEMADEX) 20 MG tablet Take 2 tablets (40 mg total) by mouth 2 (two) times daily. Resume on 10/19 (Patient taking differently: Take 40 mg by mouth daily as needed (fluid). )     No current facility-administered medications on file prior to visit.     Allergies:  Allergies  Allergen Reactions  . Aspirin Other (See Comments)    Nausea and upset stomach    . Nexium [Esomeprazole Magnesium] Other (See Comments)    Patient said it messed his stomach up    Review of Systems:  CONSTITUTIONAL: No fevers, chills, night sweats, or weight loss.  EYES: +visual changes or eye pain ENT: No hearing changes.  No history of nose bleeds.   RESPIRATORY: No cough, wheezing and shortness of breath.   CARDIOVASCULAR: Negative for chest pain, and palpitations.   GI: Negative for abdominal discomfort, blood in stools or black stools.  No recent change in bowel habits.   GU:  No history of incontinence.   MUSCLOSKELETAL: +history of joint pain or swelling.  No myalgias.   SKIN: Negative for lesions, rash, and itching.   ENDOCRINE: Negative for cold or heat intolerance, polydipsia or goiter.   PSYCH:  No depression or anxiety symptoms.   NEURO: As Above.   Vital Signs:  BP (!) 148/80   Pulse (!) 102   Ht 5\' 6"  (1.676 m)   Wt 277 lb 8 oz (125.9 kg)   SpO2 92%   BMI 44.79 kg/m   Neurological Exam: MENTAL STATUS including orientation to time, place, person, recent and remote memory, attention span and concentration, language, and fund of knowledge is normal.  Speech is not dysarthric.  CRANIAL NERVES: No visual field defects.  Pupils equal round and reactive to light.  Normal conjugate, extra-ocular eye movements in all directions of gaze.  No ptosis.   Face is symmetric. Palate elevates symmetrically.  Tongue is  midline.  MOTOR:  Motor strength is 5/5 in all extremities, including interosseus muscles of the hands.  He has difficulty raising his legs in hip flexion due to back pain, but motor strength testing is 5/5.  No pronator drift.  Tone is normal.    MSRs:  Reflexes are 2+/4 throughout, except 1+ at the ankles bilaterally.  SENSORY:  Intact to vibration throughout.  COORDINATION/GAIT:  Normal finger-to- nose-finger.  Gait is wide based, slow and assisted with cane.   Data: CT head 03/06/2014:   1. Small scalp hematoma over the left side of the frontal bone. 2. Otherwise, normal exams.  CT head 05/30/2914:  Negative   US carotids 06/02/2014:  Less than 50% stenosis in the right and left internal carotid arteries.  IMPRESSION/PLAN: 1.  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 Ace Gins.  Imaging was reviewed with patient.  He may benefit from physical therapy for low back pain in additional to medical management and will discuss this with her as they had mentioned doing aqua therapy 2.  Left ulnar neuropathy, mild and stable.  Recommend using a soft elbow pad to prevent compression of the nerve and avoid hyperflexion of the elbow 3.  Monocular diplopia does not have classic features of myasthenia and we discussed checking AChR antibodies, but he feels that it was strongly associated with his cataract surgery and is working with his ophthalmologist.  He will return, if he needs further neurological testing. 3.  Post-concussive headaches manifesting with left sided headaches and left sided paresthesias  - resolved! 4.  Morbid obesity.  Weight loss encouraged  Return to clinic as needed   The duration of this appointment visit was 25 minutes of face-to-face time with the patient.  Greater than 50% of this time was spent in counseling, explanation of diagnosis, planning of further management, and coordination of care.   Thank you for  allowing me to participate in patient's care.  If I can answer any additional questions, I would be pleased to do so.    Sincerely,    Jilliane Kazanjian K. Posey Pronto, DO

## 2016-02-08 DIAGNOSIS — M25562 Pain in left knee: Secondary | ICD-10-CM | POA: Diagnosis not present

## 2016-02-08 DIAGNOSIS — M4806 Spinal stenosis, lumbar region: Secondary | ICD-10-CM | POA: Diagnosis not present

## 2016-02-08 DIAGNOSIS — M47812 Spondylosis without myelopathy or radiculopathy, cervical region: Secondary | ICD-10-CM | POA: Diagnosis not present

## 2016-02-08 DIAGNOSIS — M5136 Other intervertebral disc degeneration, lumbar region: Secondary | ICD-10-CM | POA: Diagnosis not present

## 2016-02-08 DIAGNOSIS — M961 Postlaminectomy syndrome, not elsewhere classified: Secondary | ICD-10-CM | POA: Diagnosis not present

## 2016-02-08 DIAGNOSIS — M170 Bilateral primary osteoarthritis of knee: Secondary | ICD-10-CM | POA: Diagnosis not present

## 2016-02-08 DIAGNOSIS — M25561 Pain in right knee: Secondary | ICD-10-CM | POA: Diagnosis not present

## 2016-02-09 DIAGNOSIS — M47812 Spondylosis without myelopathy or radiculopathy, cervical region: Secondary | ICD-10-CM | POA: Diagnosis not present

## 2016-02-09 DIAGNOSIS — M545 Low back pain: Secondary | ICD-10-CM | POA: Diagnosis not present

## 2016-02-09 DIAGNOSIS — M961 Postlaminectomy syndrome, not elsewhere classified: Secondary | ICD-10-CM | POA: Diagnosis not present

## 2016-02-09 DIAGNOSIS — G894 Chronic pain syndrome: Secondary | ICD-10-CM | POA: Diagnosis not present

## 2016-02-10 ENCOUNTER — Encounter (HOSPITAL_COMMUNITY): Payer: Self-pay | Admitting: Oncology

## 2016-02-10 ENCOUNTER — Encounter (HOSPITAL_COMMUNITY): Payer: Medicare Other | Attending: Oncology | Admitting: Oncology

## 2016-02-10 ENCOUNTER — Encounter (HOSPITAL_COMMUNITY): Payer: Medicare Other

## 2016-02-10 DIAGNOSIS — E039 Hypothyroidism, unspecified: Secondary | ICD-10-CM | POA: Insufficient documentation

## 2016-02-10 DIAGNOSIS — G4730 Sleep apnea, unspecified: Secondary | ICD-10-CM | POA: Insufficient documentation

## 2016-02-10 DIAGNOSIS — D696 Thrombocytopenia, unspecified: Secondary | ICD-10-CM | POA: Diagnosis not present

## 2016-02-10 DIAGNOSIS — E669 Obesity, unspecified: Secondary | ICD-10-CM | POA: Insufficient documentation

## 2016-02-10 DIAGNOSIS — N400 Enlarged prostate without lower urinary tract symptoms: Secondary | ICD-10-CM | POA: Insufficient documentation

## 2016-02-10 DIAGNOSIS — D72829 Elevated white blood cell count, unspecified: Secondary | ICD-10-CM

## 2016-02-10 DIAGNOSIS — Z87891 Personal history of nicotine dependence: Secondary | ICD-10-CM | POA: Insufficient documentation

## 2016-02-10 DIAGNOSIS — E785 Hyperlipidemia, unspecified: Secondary | ICD-10-CM | POA: Insufficient documentation

## 2016-02-10 DIAGNOSIS — I5032 Chronic diastolic (congestive) heart failure: Secondary | ICD-10-CM | POA: Insufficient documentation

## 2016-02-10 LAB — CBC WITH DIFFERENTIAL/PLATELET
BASOS PCT: 0 %
Basophils Absolute: 0 10*3/uL (ref 0.0–0.1)
EOS ABS: 0 10*3/uL (ref 0.0–0.7)
EOS PCT: 0 %
HCT: 44.4 % (ref 39.0–52.0)
Hemoglobin: 14.5 g/dL (ref 13.0–17.0)
LYMPHS ABS: 1 10*3/uL (ref 0.7–4.0)
Lymphocytes Relative: 5 %
MCH: 25.4 pg — AB (ref 26.0–34.0)
MCHC: 32.7 g/dL (ref 30.0–36.0)
MCV: 77.8 fL — ABNORMAL LOW (ref 78.0–100.0)
Monocytes Absolute: 1.1 10*3/uL — ABNORMAL HIGH (ref 0.1–1.0)
Monocytes Relative: 6 %
Neutro Abs: 16.2 10*3/uL — ABNORMAL HIGH (ref 1.7–7.7)
Neutrophils Relative %: 88 %
PLATELETS: ADEQUATE 10*3/uL (ref 150–400)
RBC: 5.71 MIL/uL (ref 4.22–5.81)
RDW: 18.2 % — ABNORMAL HIGH (ref 11.5–15.5)
WBC: 18.4 10*3/uL — AB (ref 4.0–10.5)

## 2016-02-10 NOTE — Progress Notes (Signed)
[name] [name]., MD [place] [id number]  Thrombocytopenia [facility]) - Plan: CBC with Differential  Leukocytosis - Plan: CBC with Differential  CURRENT THERAPY: Observation  INTERVAL HISTORY: Dave Jett 75 y.o. male returns for followup of thrombocytopenia, stable AND Mild leukocytosis, stable.  BCR/ABL, JAK2 negative.    He is doing well from a hematologic standpoint. He denies any complaints including B symptoms.    He underwent a septoplasty and bilateral partial inferior turbinate resection by Dr. Benjamine Mola 10/07/2015.  He notes a significant improvement in nasal air movement.  He continues to see performance spine sports specialist, Dr. Raquel Sarna [place]-Bethea.  He continues with the sought joint steroid injections under fluoroscopic guidance. He notes that this has provided significant pain relief.  Review of Systems  Constitutional: Negative.  Negative for chills, fever and weight loss.  HENT: Negative.   Eyes: Negative.   Cardiovascular: Negative.   Gastrointestinal: Negative.   Genitourinary: Negative.   Musculoskeletal: Positive for back pain.  Skin: Negative.   Neurological: Negative.   Endo/Heme/Allergies: Negative.   Psychiatric/Behavioral: Negative.     Past Medical History:  Diagnosis Date  . Bilateral lower leg cellulitis   . BPH (benign prostatic hyperplasia)   . Cataract    left  . CHF (congestive heart failure) ([place])   . Chronic diastolic HF (heart failure) ([place])   . Collagen vascular disease ([place])    history of venous insufficency and LE cellulitis  . Complication of anesthesia    2012 due to sleep apnea pt has had difficulty being put under  and waking up from anesthesia  . Deep venous insufficiency   . GERD (gastroesophageal reflux disease)   . Glaucoma    left  . Headache   . Heel spur    right heel  . History of bladder infections   . History of colonic polyps   . Hyperlipidemia   . Hypertension    Sees Dr.  Debara Pickett  . Hypothyroidism    hypothyroid denies no rx  . Kidney stones    passed them  . Obesity   . Osteoarthritis   . PONV (postoperative nausea and vomiting)   . PUD (peptic ulcer disease)   . Ringing in ears   . S/P colonoscopy 2007, Feb and March 2012   hx of adenomas, left-sided diverticula, On 07/2010 TCS, fresh blood and clot noted coming from TI.  . S/P endoscopy 2007, 2012   2007: nl, May 2012: antral erosions  . SIRS (systemic inflammatory response syndrome) ([place]) 02/27/2012  . Sleep apnea    uses cpap  occ "unable to use lately due to nasal pain when use" - sleep study 2008 AHI 48.80/hr and 56.70hr during REM  . Thrombocytopenia ([place]) 12/30/2011   Stable    Past Surgical History:  Procedure Laterality Date  . BACK SURGERY    . CARDIAC CATHETERIZATION  02/27/2007   no sign of CAD, LVH, mod pulm HTN (Dr. Jackie Plum(  . CATARACT EXTRACTION W/ INTRAOCULAR LENS IMPLANT Right ~ 2012  . COLONOSCOPY N/A 05/28/2014   [id number] colonic polyps/colonic diverticulosis. Tubular adenomas. Next colonoscopy January 2021  . ESOPHAGOGASTRODUODENOSCOPY  Aug 2013   [facility], single 90mm pedunculated polyp otherwise normal. HYPERPLASTIC, NEGATIVE h.pylori  . ESOPHAGOGASTRODUODENOSCOPY N/A 05/28/2014   RMR:non-critical Schatzki's ring/HH. Benign gastritis  . INGUINAL HERNIA REPAIR Right 1941  . KNEE ARTHROSCOPY Left ~ 2000  . LAPAROSCOPIC CHOLECYSTECTOMY    . [place] SURGERY  2012  .  MALONEY DILATION N/A 05/28/2014   Procedure: Venia Minks DILATION;  Surgeon: Daneil Dolin, MD;  Location[facility];  Service: Endoscopy;  Laterality: N/A;  . NASAL SEPTOPLASTY W/ TURBINOPLASTY Bilateral 10/07/2015   Procedure: NASAL SEPTOPLASTY WITH TURBINATE REDUCTION;  Surgeon: Leta Baptist, MD;  Location: [facility];  Service: ENT;  Laterality: Bilateral;  . NASAL SEPTUM SURGERY Bilateral 10/07/2015    inferior turbinate resection  . NM MYOCAR PERF WALL MOTION  12/2006   dipyridamole myoview - stress images show mild  perfusion defect in mid inferior walls with reversibility at rest; mild perfusion defect in mid inferolateral wall at stress with mild defect reversibility, EF 62%, abnormal but low risk study   . POSTERIOR LUMBAR FUSION  2015  . SAVORY DILATION N/A 05/28/2014   Procedure: SAVORY DILATION;  Surgeon: Daneil Dolin, MD;  Location: [facility];  Service: Endoscopy;  Laterality: N/A;  . SHOULDER OPEN ROTATOR CUFF REPAIR Left early 2000s  . small bowel capsule study  07/2011   ?transient focal ischemia in distal small bowel to explain GI bleeding  . TRANSTHORACIC ECHOCARDIOGRAM  09/2008   EF 60-65%, mod conc LVH  . TRANSURETHRAL RESECTION OF PROSTATE    . TRANSURETHRAL RESECTION OF PROSTATE N/A 06/18/2013   Procedure: TRANSURETHRAL RESECTION OF THE PROSTATE (TURP);  Surgeon: Marissa Nestle, MD;  Location: [facility];  Service: Urology;  Laterality: N/A;    Family History  Problem Relation Age of Onset  . Colon cancer Father 14    deceased  . Prostate cancer Father   . Pancreatic cancer Mother 71    deceased  . Prostate cancer Brother   . Arthritis Son   . Arthritis Son   . Diabetes Mellitus II Son     Social History   Social History  . Marital status: Widowed    Spouse name: Roger Kill  . Number of children: 5  . Years of education: College   Occupational History  . disability Retired   Social History Main Topics  . Smoking status: Former Smoker    Years: 8.00    Types: Cigars    Start date: 11/20/1959    Quit date: 08/10/1973  . Smokeless tobacco: Never Used  . Alcohol use 0.0 oz/week     Comment: 10/07/2015 "drank a little bit; stopped in ~ 1975"  . Drug use: No  . Sexual activity: No   Other Topics Concern  . None   Social History Narrative   Patient lives at home with spouse and son.   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         PHYSICAL EXAMINATION  ECOG PERFORMANCE STATUS: 1 - Symptomatic but completely ambulatory  Vitals:    02/10/16 1025  BP: (!) 101/54  Pulse: 83  Resp: 18  Temp: 98.6 F (37 C)    GENERAL:alert, no distress, well nourished, well developed, comfortable, cooperative, obese, smiling and accompanied by his son, Evette Doffing. SKIN: skin color, texture, turgor are normal, no rashes or significant lesions HEAD: Normocephalic, No masses, lesions, tenderness or abnormalities EYES: normal, EOMI, Conjunctiva are pink and non-injected EARS: External ears normal OROPHARYNX:lips, buccal mucosa, and tongue normal and mucous membranes are moist  NECK: supple, no adenopathy, thyroid normal size, non-tender, without nodularity, trachea midline LYMPH:  no palpable lymphadenopathy BREAST:not examined LUNGS: clear to auscultation  HEART: regular rate & rhythm ABDOMEN:abdomen soft and normal bowel sounds BACK: Back symmetric, no curvature. EXTREMITIES:less then 2 second capillary  refill, no joint deformities, effusion, or inflammation, no skin discoloration, no clubbing  NEURO: alert & oriented x 3 with fluent speech, no focal motor/sensory deficits, gait normal   LABORATORY DATA: CBC    Component Value Date/Time   WBC 18.4 (H) 02/10/2016 0932   RBC 5.71 02/10/2016 0932   HGB 14.5 02/10/2016 0932   HCT 44.4 02/10/2016 0932   PLT  02/10/2016 0932    PLATELET CLUMPS NOTED ON SMEAR, COUNT APPEARS ADEQUATE   MCV 77.8 (L) 02/10/2016 0932   MCH 25.4 (L) 02/10/2016 0932   MCHC 32.7 02/10/2016 0932   RDW 18.2 (H) 02/10/2016 0932   LYMPHSABS 1.0 02/10/2016 0932   MONOABS 1.1 (H) 02/10/2016 0932   EOSABS 0.0 02/10/2016 0932   BASOSABS 0.0 02/10/2016 0932      Chemistry      Component Value Date/Time   NA 137 01/26/2016 1102   K 4.2 01/26/2016 1102   CL 103 01/26/2016 1102   CO2 29 01/26/2016 1102   BUN 16 01/26/2016 1102   CREATININE 1.09 01/26/2016 1102   CREATININE 1.08 11/14/2013 1125      Component Value Date/Time   CALCIUM 9.2 01/26/2016 1102   ALKPHOS 63 10/19/2015 1216   AST 19 10/19/2015  1216   ALT 15 (L) 10/19/2015 1216   BILITOT 0.9 10/19/2015 1216        PENDING LABS:   RADIOGRAPHIC STUDIES:  Dg Chest 2 View  Result Date: 01/26/2016 CLINICAL DATA:  Increasing lightheadedness since this morning. EXAM: CHEST  2 VIEW COMPARISON:  Single-view of the chest 01/26/2016. PA and lateral chest 10/19/2015. FINDINGS: Mild subsegmental atelectasis is seen in the left lung base. The lungs are otherwise clear. Heart size is normal. No pneumothorax or pleural effusion. Aortic atherosclerosis is noted. IMPRESSION: No acute disease. Atherosclerosis. Electronically Signed   By: Inge Rise M.D.   On: 01/26/2016 13:08   Mr Lumbar Spine Wo Contrast  Result Date: 01/29/2016 CLINICAL DATA:  Chronic increasing low back pain. EXAM: MRI LUMBAR SPINE WITHOUT CONTRAST TECHNIQUE: Multiplanar, multisequence MR imaging of the lumbar spine was performed. No intravenous contrast was administered. COMPARISON:  12/25/2014 FINDINGS: Segmentation:  Standard. Alignment:  2 mm of retrolisthesis of L2 on L3. Vertebrae:  No fracture, evidence of discitis, or bone lesion. Conus medullaris: Extends to the T12-L1 level and appears normal. Paraspinal and other soft tissues: Negative. Disc levels: Disc spaces: Posterior lumbar interbody fusion at L4-5 without failure or complication. T12-L1: Mild broad-based disc bulge. No evidence of neural foraminal stenosis. No central canal stenosis. L1-L2: Mild broad-based disc bulge. Mild bilateral facet arthropathy. No evidence of neural foraminal stenosis. No central canal stenosis. L2-L3: Left foraminal disc protrusion narrowing the inferior left L2-3 foramen. Mild bilateral facet arthropathy. No right foraminal stenosis. No central canal stenosis. L3-L4: Broad-based disc bulge. Moderate bilateral facet arthropathy. Moderate spinal stenosis and bilateral lateral recess stenosis. Mild left foraminal stenosis. No central canal stenosis. L4-L5: Interbody fusion. No evidence of  neural foraminal stenosis. No central canal stenosis. L5-S1: No significant disc bulge. No central canal stenosis. Severe bilateral facet arthropathy. Mild right foraminal stenosis. IMPRESSION: 1. At L2-3 there is a left foraminal disc protrusion narrowing the inferior left L2-3 foramen. Mild bilateral facet arthropathy. 2. At L3-4 there is a broad-based disc bulge. Moderate bilateral facet arthropathy. Moderate spinal stenosis and bilateral lateral recess stenosis. Mild left foraminal stenosis. 3. Posterior lumbar interbody fusion at L4-5. Electronically Signed   By: Kathreen Devoid   On: 01/29/2016 12:27  Dg Chest Port 1 View  Result Date: 01/26/2016 CLINICAL DATA:  History of CHF, hypertension, former smoker. Recent elevation in blood pressure. EXAM: PORTABLE CHEST 1 VIEW COMPARISON:  PA and lateral chest x-ray of Oct 19, 2015. FINDINGS: The lungs are hypoinflated. The pulmonary vascularity is engorged. The interstitial markings are mildly prominent. There may be small bilateral pleural effusions. The cardiac silhouette is enlarged where visualized. There is calcification in the wall of the aortic arch. IMPRESSION: The study is limited due to hypoinflation. The findings are consistent with CHF with mild interstitial edema and possible small bilateral pleural effusions. Electronically Signed   By: David  Martinique M.D.   On: 01/26/2016 11:09     PATHOLOGY:    ASSESSMENT AND PLAN:  Thrombocytopenia ([place]) Thrombocytopenia dating back to at least 2009 ranging in the 80's to 150's.  Very stable.    Labs today CBC diff.  I personally reviewed and went over laboratory results with the patient.  The results are noted within this dictation.  At this time, labs are pending. He asks that we mail him a copy of his laboratory work which we can do.  Labs in 6 months: CBC diff.  Return in 6 months for follow-up  Leukocytosis Leukocytosis with neutrophilia dating back to at least 2010; JAK2 and BCR/ABL  negative.  No documented peripheral flow cytometry.    Labs in 6 months: CBC diff.   ORDERS PLACED FOR THIS ENCOUNTER: Orders Placed This Encounter  Procedures  . CBC with Differential    MEDICATIONS PRESCRIBED THIS ENCOUNTER: No orders of the defined types were placed in this encounter.   THERAPY PLAN:  Ongoing observation as outlined above.  All questions were answered. The patient knows to call the clinic with any problems, questions or concerns. We can certainly see the patient much sooner if necessary.  Patient and plan discussed with Dr. Ancil Linsey and she is in agreement with the aforementioned.   This note is electronically signed by: Robynn Pane, PA-C 02/10/2016 11:02 AM

## 2016-02-10 NOTE — Assessment & Plan Note (Addendum)
Leukocytosis with neutrophilia dating back to at least 2010; JAK2 and BCR/ABL negative.  No documented peripheral flow cytometry.    Labs in 6 months: CBC diff.

## 2016-02-10 NOTE — Assessment & Plan Note (Addendum)
Thrombocytopenia dating back to at least 2009 ranging in the 80's to 150's.  Very stable.    Labs today CBC diff.  I personally reviewed and went over laboratory results with the patient.  The results are noted within this dictation.  At this time, labs are pending. He asks that we mail him a copy of his laboratory work which we can do.  Labs in 6 months: CBC diff.  Return in 6 months for follow-up

## 2016-02-10 NOTE — Patient Instructions (Signed)
[facility] Discharge Instructions  RECOMMENDATIONS MADE BY THE CONSULTANT AND ANY TEST RESULTS WILL BE SENT TO YOUR REFERRING PHYSICIAN.  You were seen today by Tom Kefalas PA-C. Return in 6 months for labs and follow up visit.    Thank you for choosing  Cancer Center at [facility] to provide your oncology and hematology care.  To afford each patient quality time with our provider, please arrive at least 15 minutes before your scheduled appointment time.   Beginning January 23rd 2017 lab work for the Cancer Center will be done in the  Main lab at [facility] on 1st floor. If you have a lab appointment with the Cancer Center please come in thru the  Main Entrance and check in at the main information desk  You need to re-schedule your appointment should you arrive 10 or more minutes late.  We strive to give you quality time with our providers, and arriving late affects you and other patients whose appointments are after yours.  Also, if you no show three or more times for appointments you may be dismissed from the clinic at the providers discretion.     Again, thank you for choosing [facility].  Our hope is that these requests will decrease the amount of time that you wait before being seen by our physicians.       _____________________________________________________________  Should you have questions after your visit to [facility], please contact our office at [phone] between the hours of 8:30 a.m. and 4:30 p.m.  Voicemails left after 4:30 p.m. will not be returned until the following business day.  For prescription refill requests, have your pharmacy contact our office.         Resources For Cancer Patients and their Caregivers ? American Cancer Society: Can assist with transportation, wigs, general needs, runs Look Good Feel Better.        1-888-227-6333 ? Cancer Care: Provides financial  assistance, online support groups, medication/co-pay assistance.  1-800-813-HOPE (4673) ? [facility] Assists [place] cancer patients and their families through emotional , educational and financial support.  336-427-4357 ? [place] DSS Where to apply for food stamps, Medicaid and utility assistance. 336-342-1394 ? [facility]: Transportation to medical appointments. 336-347-2287 ? Social Security Administration: May apply for disability if have a Stage IV cancer. 336-342-7796 1-800-772-1213 ? [place] Aging, Disability and Transit Services: Assists with nutrition, care and transit needs. 336-349-2343  Cancer Center Support Programs: @10RELATIVEDAYS@ > Cancer Support Group  2nd Tuesday of the month 1pm-2pm, Journey Room  > Creative Journey  3rd Tuesday of the month 1130am-1pm, Journey Room  > Look Good Feel Better  1st Wednesday of the month 10am-12 noon, Journey Room (Call American Cancer Society to register 1-800-395-5775)   

## 2016-02-22 ENCOUNTER — Telehealth: Payer: Self-pay | Admitting: *Deleted

## 2016-02-22 NOTE — Telephone Encounter (Signed)
-----   Message from Arnoldo Lenis, MD sent at 02/19/2016  1:14 PM EDT ----- BP reviewed, overall numbers look ok. Given his recent up and down bp's I would stay where we are with his current regimen   Zandra Abts MD

## 2016-02-22 NOTE — Telephone Encounter (Signed)
Pt aware - says for last week his BP has been running "perfect"

## 2016-03-04 ENCOUNTER — Other Ambulatory Visit (HOSPITAL_COMMUNITY): Payer: Self-pay | Admitting: Oncology

## 2016-03-04 DIAGNOSIS — R718 Other abnormality of red blood cells: Secondary | ICD-10-CM

## 2016-03-07 DIAGNOSIS — Z23 Encounter for immunization: Secondary | ICD-10-CM | POA: Diagnosis not present

## 2016-03-15 DIAGNOSIS — I739 Peripheral vascular disease, unspecified: Secondary | ICD-10-CM | POA: Diagnosis not present

## 2016-03-20 ENCOUNTER — Other Ambulatory Visit: Payer: Self-pay

## 2016-03-20 ENCOUNTER — Encounter (HOSPITAL_COMMUNITY): Payer: Self-pay | Admitting: *Deleted

## 2016-03-20 ENCOUNTER — Emergency Department (HOSPITAL_COMMUNITY)
Admission: EM | Admit: 2016-03-20 | Discharge: 2016-03-21 | Disposition: A | Discharge status: Home / Self Care | Payer: Medicare Other | Source: Home / Self Care | Attending: Emergency Medicine | Admitting: Emergency Medicine

## 2016-03-20 DIAGNOSIS — I5032 Chronic diastolic (congestive) heart failure: Secondary | ICD-10-CM | POA: Insufficient documentation

## 2016-03-20 DIAGNOSIS — I110 Hypertensive heart disease with heart failure: Secondary | ICD-10-CM | POA: Insufficient documentation

## 2016-03-20 DIAGNOSIS — J9601 Acute respiratory failure with hypoxia: Secondary | ICD-10-CM | POA: Diagnosis not present

## 2016-03-20 DIAGNOSIS — Z87891 Personal history of nicotine dependence: Secondary | ICD-10-CM | POA: Insufficient documentation

## 2016-03-20 DIAGNOSIS — I700 Atherosclerosis of aorta: Secondary | ICD-10-CM | POA: Diagnosis not present

## 2016-03-20 DIAGNOSIS — R51 Headache: Secondary | ICD-10-CM | POA: Diagnosis not present

## 2016-03-20 DIAGNOSIS — J9811 Atelectasis: Secondary | ICD-10-CM | POA: Diagnosis not present

## 2016-03-20 DIAGNOSIS — N179 Acute kidney failure, unspecified: Secondary | ICD-10-CM | POA: Diagnosis not present

## 2016-03-20 DIAGNOSIS — Z6841 Body Mass Index (BMI) 40.0 and over, adult: Secondary | ICD-10-CM | POA: Diagnosis not present

## 2016-03-20 DIAGNOSIS — E039 Hypothyroidism, unspecified: Secondary | ICD-10-CM | POA: Insufficient documentation

## 2016-03-20 DIAGNOSIS — R0902 Hypoxemia: Secondary | ICD-10-CM | POA: Diagnosis not present

## 2016-03-20 DIAGNOSIS — M5441 Lumbago with sciatica, right side: Secondary | ICD-10-CM | POA: Insufficient documentation

## 2016-03-20 DIAGNOSIS — I5033 Acute on chronic diastolic (congestive) heart failure: Secondary | ICD-10-CM | POA: Diagnosis not present

## 2016-03-20 MED ORDER — HYDROMORPHONE HCL 1 MG/ML IJ SOLN
1.0000 mg | Freq: Once | INTRAMUSCULAR | Status: AC
  Administered 2016-03-20: 1 mg via INTRAMUSCULAR
  Filled 2016-03-20: qty 1

## 2016-03-20 MED ORDER — KETOROLAC TROMETHAMINE 30 MG/ML IJ SOLN
30.0000 mg | Freq: Once | INTRAMUSCULAR | Status: AC
  Administered 2016-03-20: 30 mg via INTRAMUSCULAR
  Filled 2016-03-20: qty 1

## 2016-03-20 MED ORDER — MELOXICAM 7.5 MG PO TABS: 15.0000 mg | ORAL_TABLET | Freq: Every day | ORAL | 0 refills | Status: DC | PRN

## 2016-03-20 MED ORDER — DEXAMETHASONE 4 MG PO TABS
8.0000 mg | ORAL_TABLET | Freq: Once | ORAL | Status: AC
  Administered 2016-03-20: 8 mg via ORAL
  Filled 2016-03-20: qty 2

## 2016-03-20 NOTE — ED Triage Notes (Signed)
Pt states he has a pinched nerve in his lower back. Pt states he is currently getting cortisone shots in his back. Pt states his back was hurting earlier and he turned to get a pair of socks out of his drawer and his pain got worse.

## 2016-03-20 NOTE — ED Provider Notes (Signed)
[facility] DEPT Provider Note   CSN: [id number] Arrival date & time: 03/20/16  2205 By signing my name below, I, Dyke Brackett, attest that this documentation has been prepared under the direction and in the presence of Virgel Manifold, MD . Electronically Signed: Dyke Brackett, Scribe. 03/20/2016. 10:37 PM.   History   Chief Complaint Chief Complaint  Patient presents with  . Back Pain    HPI [name] [name] is a 75 y.o. male who presents to the Emergency Department complaining of worsening, acute on chronic lower back pain which began today. He rates his pain 10/10 in severity. Pt states he was having some moderate back pain that worsened after turning to get a pair of socks out of his drawer. Pt is currently receiving cortisone shots in his back for a pinched nerve. Lat shot was two months ago. He has taken oxycodone  and flexeril with no relief. Pt has been complaint with his medication. Pain is exacerbated by movement or breathing. He notes associated numbness and tingling.  He denies any dysuria or difficulty urinating. Patient is not taking any blood thinners. No hx DM.   The history is provided by the patient. No language interpreter was used.    Past Medical History:  Diagnosis Date  . Bilateral lower leg cellulitis   . BPH (benign prostatic hyperplasia)   . Cataract    left  . CHF (congestive heart failure) ([place])   . Chronic diastolic HF (heart failure) ([place])   . Collagen vascular disease ([place])    history of venous insufficency and LE cellulitis  . Complication of anesthesia    2012 due to sleep apnea pt has had difficulty being put under  and waking up from anesthesia  . Deep venous insufficiency   . GERD (gastroesophageal reflux disease)   . Glaucoma    left  . Headache   . Heel spur    right heel  . History of bladder infections   . History of colonic polyps   . Hyperlipidemia   . Hypertension    Sees Dr. Debara Pickett  . Hypothyroidism    hypothyroid denies no  rx  . Kidney stones    passed them  . Obesity   . Osteoarthritis   . PONV (postoperative nausea and vomiting)   . PUD (peptic ulcer disease)   . Ringing in ears   . S/P colonoscopy 2007, Feb and March 2012   hx of adenomas, left-sided diverticula, On 07/2010 TCS, fresh blood and clot noted coming from TI.  . S/P endoscopy 2007, 2012   2007: nl, May 2012: antral erosions  . SIRS (systemic inflammatory response syndrome) ([place]) 02/27/2012  . Sleep apnea    uses cpap  occ "unable to use lately due to nasal pain when use" - sleep study 2008 AHI 48.80/hr and 56.70hr during REM  . Thrombocytopenia ([place]) 12/30/2011   Stable    Patient Active Problem List   Diagnosis Date Noted  . S/P nasal septoplasty 10/07/2015  . Parotitis 03/06/2015  . Chronic diastolic CHF (congestive heart failure) ([place]) 03/06/2015  . Parotitis, acute 03/06/2015  . Injection site irritation 03/06/2015  . Nausea without vomiting 12/08/2014  . Dysphagia, pharyngoesophageal phase   . History of colonic polyps   . Hypokalemia 09/01/2013  . Dehydration 09/01/2013  . Chest pain 09/01/2013  . Leukocytosis 09/01/2013  . Right-sided heart failure 08/20/2013  . Acute on chronic diastolic heart failure ([place]) 08/20/2013  . Acute on chronic diastolic CHF [id number]  . Diastolic  dysfunction by echo Dec 2014 07/30/2013  . Morbid obesity- BMI 44.5 07/30/2013  . Edema of both legs 07/30/2013  . Bilateral lower leg cellulitis 07/18/2013  . Pre-operative cardiovascular examination 06/12/2013  . 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 Right heart failure 11/27/2012  . OSA (obstructive sleep apnea) 11/27/2012  . Other spondylosis with radiculopathy, lumbar region 08/22/2012  . Bright red blood per rectum 06/15/2012  . Constipation 02/29/2012  . Edema 02/29/2012  . SIRS (systemic inflammatory response syndrome) ([place]) 02/27/2012  . UTI (lower urinary tract infection) 02/27/2012  . BPH (benign prostatic hyperplasia)  02/27/2012  . Obesity 02/27/2012  . Hyperlipidemia 02/27/2012  . Thrombocytopenia ([place]) 12/30/2011  . Hypothyroidism 10/27/2011  . Upper abdominal pain 10/27/2011  . Anorexia 10/27/2011  . Bowel habit changes 10/27/2011  . Constipation 04/20/2011  . Rectal bleeding 11/16/2010  . NAUSEA 06/08/2010  . DIARRHEA 06/08/2010  . COLONIC POLYPS, ADENOMATOUS, HX OF 07/16/2009  . Essential hypertension 06/19/2009  . GASTROESOPHAGEAL REFLUX DISEASE, CHRONIC 06/19/2009  . FATTY LIVER DISEASE 06/19/2009  . CHOLELITHIASIS, SYMPTOMATIC 06/19/2009  . RENAL INSUFFICIENCY 06/19/2009  . ABDOMINAL BLOATING 06/19/2009    Past Surgical History:  Procedure Laterality Date  . BACK SURGERY    . CARDIAC CATHETERIZATION  02/27/2007   no sign of CAD, LVH, mod pulm HTN (Dr. Jackie Plum(  . CATARACT EXTRACTION W/ INTRAOCULAR LENS IMPLANT Right ~ 2012  . COLONOSCOPY N/A 05/28/2014   [id number] colonic polyps/colonic diverticulosis. Tubular adenomas. Next colonoscopy January 2021  . ESOPHAGOGASTRODUODENOSCOPY  Aug 2013   [facility], single 50mm pedunculated polyp otherwise normal. HYPERPLASTIC, NEGATIVE h.pylori  . ESOPHAGOGASTRODUODENOSCOPY N/A 05/28/2014   RMR:non-critical Schatzki's ring/HH. Benign gastritis  . INGUINAL HERNIA REPAIR Right 1941  . KNEE ARTHROSCOPY Left ~ 2000  . LAPAROSCOPIC CHOLECYSTECTOMY    . [place] SURGERY  2012  . MALONEY DILATION N/A 05/28/2014   Procedure: Venia Minks DILATION;  Surgeon: Daneil Dolin, MD;  Location: [facility];  Service: Endoscopy;  Laterality: N/A;  . NASAL SEPTOPLASTY W/ TURBINOPLASTY Bilateral 10/07/2015   Procedure: NASAL SEPTOPLASTY WITH TURBINATE REDUCTION;  Surgeon: Leta Baptist, MD;  Location: [facility];  Service: ENT;  Laterality: Bilateral;  . NASAL SEPTUM SURGERY Bilateral 10/07/2015    inferior turbinate resection  . NM MYOCAR PERF WALL MOTION  12/2006   dipyridamole myoview - stress images show mild perfusion defect in mid inferior walls with reversibility at rest;  mild perfusion defect in mid inferolateral wall at stress with mild defect reversibility, EF 62%, abnormal but low risk study   . POSTERIOR LUMBAR FUSION  2015  . SAVORY DILATION N/A 05/28/2014   Procedure: SAVORY DILATION;  Surgeon: Daneil Dolin, MD;  Location: [facility];  Service: Endoscopy;  Laterality: N/A;  . SHOULDER OPEN ROTATOR CUFF REPAIR Left early 2000s  . small bowel capsule study  07/2011   ?transient focal ischemia in distal small bowel to explain GI bleeding  . TRANSTHORACIC ECHOCARDIOGRAM  09/2008   EF 60-65%, mod conc LVH  . TRANSURETHRAL RESECTION OF PROSTATE    . TRANSURETHRAL RESECTION OF PROSTATE N/A 06/18/2013   Procedure: TRANSURETHRAL RESECTION OF THE PROSTATE (TURP);  Surgeon: Marissa Nestle, MD;  Location: [facility];  Service: Urology;  Laterality: N/A;       Home Medications    Prior to Admission medications   Medication Sig Start Date End Date Taking? Authorizing Provider  acetaminophen (TYLENOL) 325 MG tablet Take 650 mg by mouth every 6 (six) hours as  needed.    Historical Provider, MD  amLODipine (NORVASC) 5 MG tablet Take 5 mg by mouth daily.    Historical Provider, MD  aspirin 325 MG tablet Take 650 mg by mouth daily as needed for mild pain.    Historical Provider, MD  bisacodyl (DULCOLAX) 5 MG EC tablet Take 5 mg by mouth daily as needed for moderate constipation.    Historical Provider, MD  calcium citrate (CALCITRATE - DOSED IN MG ELEMENTAL CALCIUM) 950 MG tablet Take 200 mg of elemental calcium by mouth daily.    Historical Provider, MD  Cholecalciferol (VITAMIN D3) 1000 units CAPS Take 1 capsule by mouth.    Historical Provider, MD  docusate sodium (COLACE) 100 MG capsule Take 200 mg by mouth daily as needed for mild constipation. Reported on 09/11/2015    Historical Provider, MD  dorzolamide-timolol (COSOPT) 22.3-6.8 MG/ML ophthalmic solution Place 1 drop into both eyes 2 (two) times daily. 12/17/14   Historical Provider, MD  Ginger, Zingiber  officinalis, (GINGER ROOT PO) Take 1 tablet by mouth daily.     Historical Provider, MD  IRON PO Take 325 mg by mouth daily.     Historical Provider, MD  latanoprost (XALATAN) 0.005 % ophthalmic solution Place 1 drop into both eyes at bedtime.    Historical Provider, MD  magnesium oxide (MAG-OX) 400 MG tablet Take 400 mg by mouth daily.    Historical Provider, MD  metoprolol tartrate (LOPRESSOR) 25 MG tablet Take 25 mg by mouth daily.    Historical Provider, MD  Oxycodone HCl 10 MG TABS Take 10 mg by mouth 4 (four) times daily as needed (pain).    Historical Provider, MD  pantoprazole (PROTONIX) 40 MG tablet Take 40 mg by mouth 2 (two) times daily.    Historical Provider, MD  Polyethyl Glycol-Propyl Glycol (LUBRICANT EYE DROPS) 0.4-0.3 % SOLN Apply 1 drop to eye daily as needed (for lubricant eye drops).    Historical Provider, MD  POTASSIUM CITRATE PO Take 1 packet by mouth 2 (two) times daily.    Historical Provider, MD  tiZANidine (ZANAFLEX) 4 MG tablet Take 4 mg by mouth 3 (three) times daily. Time 10 days    Historical Provider, MD  torsemide (DEMADEX) 20 MG tablet Take 2 tablets (40 mg total) by mouth 2 (two) times daily. Resume on 10/19 Patient taking differently: Take 40 mg by mouth daily as needed (fluid).  03/08/15   Kathie Dike, MD    Family History Family History  Problem Relation Age of Onset  . Colon cancer Father 39    deceased  . Prostate cancer Father   . Pancreatic cancer Mother 25    deceased  . Prostate cancer Brother   . Arthritis Son   . Arthritis Son   . Diabetes Mellitus II Son     Social History Social History  Substance Use Topics  . Smoking status: Former Smoker    Years: 8.00    Types: Cigars    Start date: 11/20/1959    Quit date: 08/10/1973  . Smokeless tobacco: Never Used  . Alcohol use 0.0 oz/week     Comment: 10/07/2015 "drank a little bit; stopped in ~ 1975"     Allergies   Aspirin and Nexium [esomeprazole magnesium]   Review of  Systems Review of Systems 10 systems reviewed and all are negative for acute change except as noted in the HPI.   Physical Exam Updated Vital Signs BP 157/81 (BP Location: Left Arm)   Pulse 114  Temp 98.9 F (37.2 C) (Temporal)   Resp 24   Ht 5\' 6"  (1.676 m)   Wt 270 lb (122.5 kg)   SpO2 93%   BMI 43.58 kg/m   Physical Exam  Constitutional: He is oriented to person, place, and time. He appears well-developed and well-nourished.  HENT:  Head: Normocephalic and atraumatic.  Eyes: EOM are normal.  Neck: Normal range of motion.  Cardiovascular: Normal rate, regular rhythm, normal heart sounds and intact distal pulses.   Pulmonary/Chest: Effort normal and breath sounds normal. No respiratory distress.  Abdominal: Soft. He exhibits no distension. There is no tenderness.  Musculoskeletal: Normal range of motion. He exhibits edema and tenderness.  Mild tenderness right SI joint and around right buttock; severe symmetric lower extremity edema  Neurological: He is alert and oriented to person, place, and time.  sensation intact to light touch; normal strength  Skin: Skin is warm and dry.  Good cap refill  Psychiatric: He has a normal mood and affect. Judgment normal.  Nursing note and vitals reviewed.   ED Treatments / Results  DIAGNOSTIC STUDIES:  Oxygen Saturation is 93% on [place] 4L/min, low by my interpretation.    COORDINATION OF CARE:  10:34 PM Discussed treatment plan with pt at bedside and pt agreed to plan.   Labs (all labs ordered are listed, but only abnormal results are displayed) Labs Reviewed - No data to display  EKG  EKG Interpretation None       Radiology No results found.  Procedures Procedures (including critical care time)  Medications Ordered in ED Medications - No data to display   Initial Impression / Assessment and Plan / ED Course  I have reviewed the triage vital signs and the nursing notes.  Pertinent labs & imaging results that were  available during my care of the patient were reviewed by me and considered in my medical decision making (see chart for details).  Clinical Course    Sensation well with acute on chronic lower back pain. Treated symptomatically with some improvement. Plan continue symptomatic treatment. Has known spinal stenosis disc disease noted on MRI September. I think his pain is from that. No acute urinary complaints. No acute neurological complaints terms of weakness or numbness. Has severe lower extremity edema but this appears chronic in nature.  Final Clinical Impressions(s) / ED Diagnoses   Final diagnoses:  Acute right-sided low back pain with right-sided sciatica    New Prescriptions New Prescriptions   No medications on file    I personally preformed the services scribed in my presence. The recorded information has been reviewed is accurate. Virgel Manifold, MD.    Virgel Manifold, MD 03/28/16 [phone]

## 2016-03-20 NOTE — ED Notes (Signed)
Got patient in stretcher and patient complains of feeling short of breath. Patients sats 85% on RA. Placed patient in 4 LPM sats increased to 93%. Patient states he has been dealing with fluid in legs and abdomen is more distended than normal.

## 2016-03-21 NOTE — ED Notes (Signed)
Pt alert & oriented x4. Patient given discharge instructions, paperwork & prescription(s). Patient verbalized understanding. Pt left department in wheelchair escorted by staff. Pt left w/ no further questions. 

## 2016-03-22 ENCOUNTER — Emergency Department (HOSPITAL_COMMUNITY): Payer: Medicare Other

## 2016-03-22 ENCOUNTER — Other Ambulatory Visit: Payer: Self-pay

## 2016-03-22 ENCOUNTER — Inpatient Hospital Stay (HOSPITAL_COMMUNITY)
Admission: EM | Admit: 2016-03-22 | Discharge: 2016-03-26 | DRG: 291 | Disposition: A | Discharge status: Home / Self Care | Payer: Medicare Other | Attending: Internal Medicine | Admitting: Internal Medicine

## 2016-03-22 ENCOUNTER — Other Ambulatory Visit (HOSPITAL_COMMUNITY): Payer: Medicare Other

## 2016-03-22 ENCOUNTER — Encounter (HOSPITAL_COMMUNITY): Payer: Self-pay | Admitting: Emergency Medicine

## 2016-03-22 DIAGNOSIS — J9811 Atelectasis: Secondary | ICD-10-CM | POA: Diagnosis not present

## 2016-03-22 DIAGNOSIS — I5033 Acute on chronic diastolic (congestive) heart failure: Secondary | ICD-10-CM | POA: Diagnosis present

## 2016-03-22 DIAGNOSIS — E876 Hypokalemia: Secondary | ICD-10-CM | POA: Diagnosis present

## 2016-03-22 DIAGNOSIS — R600 Localized edema: Secondary | ICD-10-CM | POA: Diagnosis present

## 2016-03-22 DIAGNOSIS — Z79899 Other long term (current) drug therapy: Secondary | ICD-10-CM

## 2016-03-22 DIAGNOSIS — I110 Hypertensive heart disease with heart failure: Secondary | ICD-10-CM | POA: Diagnosis not present

## 2016-03-22 DIAGNOSIS — Z888 Allergy status to other drugs, medicaments and biological substances status: Secondary | ICD-10-CM

## 2016-03-22 DIAGNOSIS — Z87440 Personal history of urinary (tract) infections: Secondary | ICD-10-CM

## 2016-03-22 DIAGNOSIS — I700 Atherosclerosis of aorta: Secondary | ICD-10-CM | POA: Diagnosis not present

## 2016-03-22 DIAGNOSIS — M5441 Lumbago with sciatica, right side: Secondary | ICD-10-CM | POA: Diagnosis not present

## 2016-03-22 DIAGNOSIS — Z833 Family history of diabetes mellitus: Secondary | ICD-10-CM

## 2016-03-22 DIAGNOSIS — G4730 Sleep apnea, unspecified: Secondary | ICD-10-CM | POA: Diagnosis present

## 2016-03-22 DIAGNOSIS — Z87442 Personal history of urinary calculi: Secondary | ICD-10-CM

## 2016-03-22 DIAGNOSIS — I872 Venous insufficiency (chronic) (peripheral): Secondary | ICD-10-CM | POA: Diagnosis present

## 2016-03-22 DIAGNOSIS — H409 Unspecified glaucoma: Secondary | ICD-10-CM | POA: Diagnosis present

## 2016-03-22 DIAGNOSIS — R51 Headache: Secondary | ICD-10-CM | POA: Diagnosis not present

## 2016-03-22 DIAGNOSIS — E039 Hypothyroidism, unspecified: Secondary | ICD-10-CM | POA: Diagnosis present

## 2016-03-22 DIAGNOSIS — N400 Enlarged prostate without lower urinary tract symptoms: Secondary | ICD-10-CM | POA: Diagnosis present

## 2016-03-22 DIAGNOSIS — Z6841 Body Mass Index (BMI) 40.0 and over, adult: Secondary | ICD-10-CM

## 2016-03-22 DIAGNOSIS — R109 Unspecified abdominal pain: Secondary | ICD-10-CM

## 2016-03-22 DIAGNOSIS — Z87891 Personal history of nicotine dependence: Secondary | ICD-10-CM | POA: Diagnosis not present

## 2016-03-22 DIAGNOSIS — N179 Acute kidney failure, unspecified: Secondary | ICD-10-CM | POA: Diagnosis present

## 2016-03-22 DIAGNOSIS — Z886 Allergy status to analgesic agent status: Secondary | ICD-10-CM

## 2016-03-22 DIAGNOSIS — E785 Hyperlipidemia, unspecified: Secondary | ICD-10-CM | POA: Diagnosis present

## 2016-03-22 DIAGNOSIS — Z9114 Patient's other noncompliance with medication regimen: Secondary | ICD-10-CM

## 2016-03-22 DIAGNOSIS — K7689 Other specified diseases of liver: Secondary | ICD-10-CM

## 2016-03-22 DIAGNOSIS — G8929 Other chronic pain: Secondary | ICD-10-CM | POA: Diagnosis present

## 2016-03-22 DIAGNOSIS — D72829 Elevated white blood cell count, unspecified: Secondary | ICD-10-CM | POA: Diagnosis present

## 2016-03-22 DIAGNOSIS — Z86010 Personal history of colonic polyps: Secondary | ICD-10-CM

## 2016-03-22 DIAGNOSIS — M549 Dorsalgia, unspecified: Secondary | ICD-10-CM | POA: Diagnosis present

## 2016-03-22 DIAGNOSIS — E669 Obesity, unspecified: Secondary | ICD-10-CM | POA: Diagnosis present

## 2016-03-22 DIAGNOSIS — Z8711 Personal history of peptic ulcer disease: Secondary | ICD-10-CM

## 2016-03-22 DIAGNOSIS — K219 Gastro-esophageal reflux disease without esophagitis: Secondary | ICD-10-CM | POA: Diagnosis present

## 2016-03-22 DIAGNOSIS — K5900 Constipation, unspecified: Secondary | ICD-10-CM | POA: Diagnosis present

## 2016-03-22 DIAGNOSIS — J9601 Acute respiratory failure with hypoxia: Secondary | ICD-10-CM | POA: Diagnosis present

## 2016-03-22 LAB — CBC WITH DIFFERENTIAL/PLATELET
Basophils Absolute: 0 10*3/uL (ref 0.0–0.1)
Basophils Relative: 0 %
EOS PCT: 0 %
Eosinophils Absolute: 0 10*3/uL (ref 0.0–0.7)
HCT: 42.4 % (ref 39.0–52.0)
Hemoglobin: 13.9 g/dL (ref 13.0–17.0)
LYMPHS ABS: 1.7 10*3/uL (ref 0.7–4.0)
LYMPHS PCT: 9 %
MCH: 25.6 pg — AB (ref 26.0–34.0)
MCHC: 32.8 g/dL (ref 30.0–36.0)
MCV: 77.9 fL — AB (ref 78.0–100.0)
MONO ABS: 0.6 10*3/uL (ref 0.1–1.0)
Monocytes Relative: 4 %
Neutro Abs: 15.3 10*3/uL — ABNORMAL HIGH (ref 1.7–7.7)
Neutrophils Relative %: 87 %
PLATELETS: 143 10*3/uL — AB (ref 150–400)
RBC: 5.44 MIL/uL (ref 4.22–5.81)
RDW: 18.2 % — AB (ref 11.5–15.5)
WBC: 17.6 10*3/uL — ABNORMAL HIGH (ref 4.0–10.5)

## 2016-03-22 LAB — COMPREHENSIVE METABOLIC PANEL
ALT: 16 U/L — ABNORMAL LOW (ref 17–63)
ANION GAP: 5 (ref 5–15)
AST: 18 U/L (ref 15–41)
Albumin: 3.3 g/dL — ABNORMAL LOW (ref 3.5–5.0)
Alkaline Phosphatase: 63 U/L (ref 38–126)
BUN: 13 mg/dL (ref 6–20)
CHLORIDE: 101 mmol/L (ref 101–111)
CO2: 30 mmol/L (ref 22–32)
Calcium: 9.4 mg/dL (ref 8.9–10.3)
Creatinine, Ser: 0.9 mg/dL (ref 0.61–1.24)
Glucose, Bld: 123 mg/dL — ABNORMAL HIGH (ref 65–99)
POTASSIUM: 4.1 mmol/L (ref 3.5–5.1)
Sodium: 136 mmol/L (ref 135–145)
Total Bilirubin: 1 mg/dL (ref 0.3–1.2)
Total Protein: 6.4 g/dL — ABNORMAL LOW (ref 6.5–8.1)

## 2016-03-22 LAB — URINALYSIS, ROUTINE W REFLEX MICROSCOPIC
BILIRUBIN URINE: NEGATIVE
Glucose, UA: NEGATIVE mg/dL
Hgb urine dipstick: NEGATIVE
KETONES UR: NEGATIVE mg/dL
LEUKOCYTES UA: NEGATIVE
NITRITE: NEGATIVE
Protein, ur: NEGATIVE mg/dL
SPECIFIC GRAVITY, URINE: 1.01 (ref 1.005–1.030)
pH: 7.5 (ref 5.0–8.0)

## 2016-03-22 LAB — PROTIME-INR
INR: 0.97
Prothrombin Time: 12.9 seconds (ref 11.4–15.2)

## 2016-03-22 LAB — BRAIN NATRIURETIC PEPTIDE: B NATRIURETIC PEPTIDE 5: 24 pg/mL (ref 0.0–100.0)

## 2016-03-22 LAB — TSH: TSH: 1.074 u[IU]/mL (ref 0.350–4.500)

## 2016-03-22 LAB — LIPASE, BLOOD: Lipase: 14 U/L (ref 11–51)

## 2016-03-22 MED ORDER — ACETAMINOPHEN 650 MG RE SUPP
650.0000 mg | Freq: Four times a day (QID) | RECTAL | Status: DC | PRN
Start: 2016-03-22 — End: 2016-03-24

## 2016-03-22 MED ORDER — HYDROCODONE-ACETAMINOPHEN 5-325 MG PO TABS
1.0000 | ORAL_TABLET | ORAL | Status: DC | PRN
  Administered 2016-03-22: 1 via ORAL
  Administered 2016-03-23 – 2016-03-25 (×7): 2 via ORAL
  Administered 2016-03-25: 1 via ORAL
  Administered 2016-03-25: 2 via ORAL
  Administered 2016-03-26: 1 via ORAL
  Filled 2016-03-22 (×6): qty 2
  Filled 2016-03-22: qty 1
  Filled 2016-03-22 (×3): qty 2
  Filled 2016-03-22: qty 1

## 2016-03-22 MED ORDER — LATANOPROST 0.005 % OP SOLN
1.0000 [drp] | Freq: Every day | OPHTHALMIC | Status: DC
  Administered 2016-03-22 – 2016-03-25 (×4): 1 [drp] via OPHTHALMIC
  Filled 2016-03-22: qty 2.5

## 2016-03-22 MED ORDER — ONDANSETRON HCL 4 MG PO TABS
4.0000 mg | ORAL_TABLET | Freq: Four times a day (QID) | ORAL | Status: DC | PRN
  Administered 2016-03-23 – 2016-03-25 (×3): 4 mg via ORAL
  Filled 2016-03-22 (×3): qty 1

## 2016-03-22 MED ORDER — DOCUSATE SODIUM 100 MG PO CAPS
200.0000 mg | ORAL_CAPSULE | Freq: Every day | ORAL | Status: DC | PRN
  Administered 2016-03-23: 200 mg via ORAL
  Filled 2016-03-22: qty 2

## 2016-03-22 MED ORDER — PANTOPRAZOLE SODIUM 40 MG PO TBEC
40.0000 mg | DELAYED_RELEASE_TABLET | Freq: Two times a day (BID) | ORAL | Status: DC
  Administered 2016-03-22 – 2016-03-26 (×9): 40 mg via ORAL
  Filled 2016-03-22 (×9): qty 1

## 2016-03-22 MED ORDER — FUROSEMIDE 10 MG/ML IJ SOLN
80.0000 mg | Freq: Two times a day (BID) | INTRAMUSCULAR | Status: DC
  Administered 2016-03-22 – 2016-03-24 (×5): 80 mg via INTRAVENOUS
  Filled 2016-03-22 (×5): qty 8

## 2016-03-22 MED ORDER — HYDROMORPHONE HCL 1 MG/ML IJ SOLN
1.0000 mg | Freq: Once | INTRAMUSCULAR | Status: AC
  Administered 2016-03-22: 1 mg via INTRAVENOUS
  Filled 2016-03-22: qty 1

## 2016-03-22 MED ORDER — TIZANIDINE HCL 4 MG PO TABS
4.0000 mg | ORAL_TABLET | Freq: Three times a day (TID) | ORAL | Status: DC
  Administered 2016-03-22 – 2016-03-26 (×12): 4 mg via ORAL
  Filled 2016-03-22 (×12): qty 1

## 2016-03-22 MED ORDER — ACETAMINOPHEN 325 MG PO TABS
650.0000 mg | ORAL_TABLET | Freq: Four times a day (QID) | ORAL | Status: DC | PRN
  Administered 2016-03-22: 650 mg via ORAL
  Filled 2016-03-22: qty 2

## 2016-03-22 MED ORDER — FERROUS SULFATE 325 (65 FE) MG PO TABS
325.0000 mg | ORAL_TABLET | Freq: Every day | ORAL | Status: DC
Start: 2016-03-23 — End: 2016-03-26
  Administered 2016-03-23 – 2016-03-26 (×4): 325 mg via ORAL
  Filled 2016-03-22 (×4): qty 1

## 2016-03-22 MED ORDER — CALCIUM CARBONATE 1250 (500 CA) MG PO TABS
500.0000 mg | ORAL_TABLET | Freq: Every day | ORAL | Status: DC
  Administered 2016-03-22 – 2016-03-26 (×5): 500 mg via ORAL
  Filled 2016-03-22 (×5): qty 1

## 2016-03-22 MED ORDER — ONDANSETRON HCL 4 MG/2ML IJ SOLN
4.0000 mg | Freq: Four times a day (QID) | INTRAMUSCULAR | Status: DC | PRN
  Administered 2016-03-25 – 2016-03-26 (×2): 4 mg via INTRAVENOUS
  Filled 2016-03-22 (×2): qty 2

## 2016-03-22 MED ORDER — HEPARIN SODIUM (PORCINE) 5000 UNIT/ML IJ SOLN
5000.0000 [IU] | Freq: Three times a day (TID) | INTRAMUSCULAR | Status: DC
  Administered 2016-03-22 – 2016-03-26 (×12): 5000 [IU] via SUBCUTANEOUS
  Filled 2016-03-22 (×12): qty 1

## 2016-03-22 MED ORDER — ONDANSETRON HCL 4 MG/2ML IJ SOLN
4.0000 mg | Freq: Once | INTRAMUSCULAR | Status: AC
  Administered 2016-03-22: 4 mg via INTRAVENOUS
  Filled 2016-03-22: qty 2

## 2016-03-22 MED ORDER — IOPAMIDOL (ISOVUE-370) INJECTION 76%
100.0000 mL | Freq: Once | INTRAVENOUS | Status: AC | PRN
  Administered 2016-03-22: 100 mL via INTRAVENOUS

## 2016-03-22 MED ORDER — POLYVINYL ALCOHOL 1.4 % OP SOLN
1.0000 [drp] | Freq: Every day | OPHTHALMIC | Status: DC | PRN
Start: 2016-03-22 — End: 2016-03-26
  Administered 2016-03-23: 1 [drp] via OPHTHALMIC
  Filled 2016-03-22: qty 15

## 2016-03-22 MED ORDER — VITAMIN D 1000 UNITS PO TABS
1000.0000 [IU] | ORAL_TABLET | Freq: Every day | ORAL | Status: DC
  Administered 2016-03-22 – 2016-03-26 (×5): 1000 [IU] via ORAL
  Filled 2016-03-22 (×5): qty 1

## 2016-03-22 MED ORDER — HYDROMORPHONE HCL 1 MG/ML IJ SOLN
0.5000 mg | Freq: Once | INTRAMUSCULAR | Status: AC
  Administered 2016-03-22: 0.5 mg via INTRAVENOUS
  Filled 2016-03-22: qty 1

## 2016-03-22 MED ORDER — SODIUM CHLORIDE 0.9% FLUSH
3.0000 mL | Freq: Two times a day (BID) | INTRAVENOUS | Status: DC
  Administered 2016-03-22 – 2016-03-26 (×9): 3 mL via INTRAVENOUS

## 2016-03-22 MED ORDER — METOPROLOL TARTRATE 25 MG PO TABS
25.0000 mg | ORAL_TABLET | Freq: Every day | ORAL | Status: DC
  Administered 2016-03-22 – 2016-03-26 (×5): 25 mg via ORAL
  Filled 2016-03-22 (×5): qty 1

## 2016-03-22 MED ORDER — MAGNESIUM OXIDE 400 (241.3 MG) MG PO TABS
400.0000 mg | ORAL_TABLET | Freq: Every day | ORAL | Status: DC
  Administered 2016-03-22 – 2016-03-26 (×5): 400 mg via ORAL
  Filled 2016-03-22 (×5): qty 1

## 2016-03-22 MED ORDER — SODIUM CHLORIDE 0.9 % IV SOLN: INTRAVENOUS | Status: DC

## 2016-03-22 MED ORDER — FUROSEMIDE 10 MG/ML IJ SOLN
40.0000 mg | Freq: Once | INTRAMUSCULAR | Status: AC
  Administered 2016-03-22: 40 mg via INTRAVENOUS
  Filled 2016-03-22: qty 4

## 2016-03-22 NOTE — ED Triage Notes (Signed)
Patient stats that he has back pain in lower back that radiates down right leg. Also, states abdominal distention, pain and nausea. Denies vomiting and diarrhea.

## 2016-03-22 NOTE — H&P (Addendum)
History and Physical    [name] [name] [id number] DOB: [birth year] DOA: 03/22/2016  PCP: [name]., MD  Patient coming from: Home  Chief Complaint: Back pain, hypoxia and lower extremity edema  HPI: [name] [name] is a 75 y.o. male with medical history significant of diastolic CHF, chronic back pain, hypertension and hypothyroidism. Patient came into the hospital because of back pain and lower extremity edema. Patient has chronic pain and he is taking opioid pain medication for that at home, for the past several days he was having more pain so he came into the ED several days ago when he was discharged with more pain medicines. While he was having more back pain he started to take less diuretics to minimize his movement out of bed, he noticed that his legs started to swell more than usual and he started to get short of breath so he came to the hospital for both back pain and lower extremity swelling.   ED Course:  Vitals: WNL, hypoxic with oxygen saturation down to 82% on room air Labs: Within normal limits Imaging: CXR radiology no acute findings Interventions: Started on Lasix.  Review of Systems:  Constitutional: negative for anorexia, fevers and sweats Eyes: negative for irritation, redness and visual disturbance Ears, nose, mouth, throat, and face: negative for earaches, epistaxis, nasal congestion and sore throat Respiratory: SOB Cardiovascular: negative for chest pain, dyspnea, lower extremity edema, orthopnea, palpitations and syncope Gastrointestinal: negative for abdominal pain, constipation, diarrhea, melena, nausea and vomiting Genitourinary:negative for dysuria, frequency and hematuria Hematologic/lymphatic: negative for bleeding, easy bruising and lymphadenopathy Musculoskeletal: Back pain Neurological: negative for coordination problems, gait problems, headaches and weakness Endocrine: negative for diabetic symptoms including polydipsia, polyuria and weight  loss Allergic/Immunologic: negative for anaphylaxis, hay fever and urticaria  Past Medical History:  Diagnosis Date  . Bilateral lower leg cellulitis   . BPH (benign prostatic hyperplasia)   . Cataract    left  . CHF (congestive heart failure) ([place])   . Chronic diastolic HF (heart failure) ([place])   . Collagen vascular disease ([place])    history of venous insufficency and LE cellulitis  . Complication of anesthesia    2012 due to sleep apnea pt has had difficulty being put under  and waking up from anesthesia  . Deep venous insufficiency   . GERD (gastroesophageal reflux disease)   . Glaucoma    left  . Headache   . Heel spur    right heel  . History of bladder infections   . History of colonic polyps   . Hyperlipidemia   . Hypertension    Sees Dr. Debara Pickett  . Hypothyroidism    hypothyroid denies no rx  . Kidney stones    passed them  . Obesity   . Osteoarthritis   . PONV (postoperative nausea and vomiting)   . PUD (peptic ulcer disease)   . Ringing in ears   . S/P colonoscopy 2007, Feb and March 2012   hx of adenomas, left-sided diverticula, On 07/2010 TCS, fresh blood and clot noted coming from TI.  . S/P endoscopy 2007, 2012   2007: nl, May 2012: antral erosions  . SIRS (systemic inflammatory response syndrome) ([place]) 02/27/2012  . Sleep apnea    uses cpap  occ "unable to use lately due to nasal pain when use" - sleep study 2008 AHI 48.80/hr and 56.70hr during REM  . Thrombocytopenia ([place]) 12/30/2011   Stable    Past Surgical History:  Procedure Laterality Date  . BACK SURGERY    .  CARDIAC CATHETERIZATION  02/27/2007   no sign of CAD, LVH, mod pulm HTN (Dr. Jackie Plum(  . CATARACT EXTRACTION W/ INTRAOCULAR LENS IMPLANT Right ~ 2012  . COLONOSCOPY N/A 05/28/2014   [id number] colonic polyps/colonic diverticulosis. Tubular adenomas. Next colonoscopy January 2021  . ESOPHAGOGASTRODUODENOSCOPY  Aug 2013   [facility], single 77mm pedunculated polyp otherwise normal. HYPERPLASTIC,  NEGATIVE h.pylori  . ESOPHAGOGASTRODUODENOSCOPY N/A 05/28/2014   RMR:non-critical Schatzki's ring/HH. Benign gastritis  . INGUINAL HERNIA REPAIR Right 1941  . KNEE ARTHROSCOPY Left ~ 2000  . LAPAROSCOPIC CHOLECYSTECTOMY    . [place] SURGERY  2012  . MALONEY DILATION N/A 05/28/2014   Procedure: Venia Minks DILATION;  Surgeon: Daneil Dolin, MD;  Location: [facility];  Service: Endoscopy;  Laterality: N/A;  . NASAL SEPTOPLASTY W/ TURBINOPLASTY Bilateral 10/07/2015   Procedure: NASAL SEPTOPLASTY WITH TURBINATE REDUCTION;  Surgeon: Leta Baptist, MD;  Location: [facility];  Service: ENT;  Laterality: Bilateral;  . NASAL SEPTUM SURGERY Bilateral 10/07/2015    inferior turbinate resection  . NM MYOCAR PERF WALL MOTION  12/2006   dipyridamole myoview - stress images show mild perfusion defect in mid inferior walls with reversibility at rest; mild perfusion defect in mid inferolateral wall at stress with mild defect reversibility, EF 62%, abnormal but low risk study   . POSTERIOR LUMBAR FUSION  2015  . SAVORY DILATION N/A 05/28/2014   Procedure: SAVORY DILATION;  Surgeon: Daneil Dolin, MD;  Location: [facility];  Service: Endoscopy;  Laterality: N/A;  . SHOULDER OPEN ROTATOR CUFF REPAIR Left early 2000s  . small bowel capsule study  07/2011   ?transient focal ischemia in distal small bowel to explain GI bleeding  . TRANSTHORACIC ECHOCARDIOGRAM  09/2008   EF 60-65%, mod conc LVH  . TRANSURETHRAL RESECTION OF PROSTATE    . TRANSURETHRAL RESECTION OF PROSTATE N/A 06/18/2013   Procedure: TRANSURETHRAL RESECTION OF THE PROSTATE (TURP);  Surgeon: Marissa Nestle, MD;  Location: [facility];  Service: Urology;  Laterality: N/A;     reports that he quit smoking about 42 years ago. His smoking use included Cigars. He started smoking about 56 years ago. He quit after 8.00 years of use. He has never used smokeless tobacco. He reports that he drinks alcohol. He reports that he does not use drugs.  Allergies  Allergen  Reactions  . Aspirin Other (See Comments)    Nausea and upset stomach    . Nexium [Esomeprazole Magnesium] Other (See Comments)    Patient said it messed his stomach up    Family History  Problem Relation Age of Onset  . Colon cancer Father 46    deceased  . Prostate cancer Father   . Pancreatic cancer Mother 65    deceased  . Prostate cancer Brother   . Arthritis Son   . Arthritis Son   . Diabetes Mellitus II Son    Prior to Admission medications   Medication Sig Start Date End Date Taking? Authorizing Provider  acetaminophen (TYLENOL) 325 MG tablet Take 650 mg by mouth every 6 (six) hours as needed.   Yes Historical Provider, MD  amLODipine (NORVASC) 5 MG tablet Take 5 mg by mouth daily.   Yes Historical Provider, MD  calcium citrate (CALCITRATE - DOSED IN MG ELEMENTAL CALCIUM) 950 MG tablet Take 200 mg of elemental calcium by mouth daily.   Yes Historical Provider, MD  Cholecalciferol (VITAMIN D3) 1000 units CAPS Take 1 capsule by mouth daily.    Yes Historical Provider,  MD  docusate sodium (COLACE) 100 MG capsule Take 200 mg by mouth daily as needed for mild constipation. Reported on 09/11/2015   Yes Historical Provider, MD  dorzolamide-timolol (COSOPT) 22.3-6.8 MG/ML ophthalmic solution Place 1 drop into both eyes 2 (two) times daily. 12/17/14  Yes Historical Provider, MD  ferrous sulfate 325 (65 FE) MG tablet Take 325 mg by mouth daily with breakfast.   Yes Historical Provider, MD  Ginger, Zingiber officinalis, (GINGER ROOT PO) Take 1 tablet by mouth 3 (three) times daily.    Yes Historical Provider, MD  latanoprost (XALATAN) 0.005 % ophthalmic solution Place 1 drop into both eyes at bedtime.   Yes Historical Provider, MD  magnesium oxide (MAG-OX) 400 MG tablet Take 400 mg by mouth daily.   Yes Historical Provider, MD  metoprolol tartrate (LOPRESSOR) 25 MG tablet Take 25 mg by mouth daily.   Yes Historical Provider, MD  Oxycodone HCl 10 MG TABS Take 10 mg by mouth 4 (four) times  daily as needed (pain).   Yes Historical Provider, MD  pantoprazole (PROTONIX) 40 MG tablet Take 40 mg by mouth 2 (two) times daily.   Yes Historical Provider, MD  Polyethyl Glycol-Propyl Glycol (LUBRICANT EYE DROPS) 0.4-0.3 % SOLN Apply 1 drop to eye daily as needed (for lubricant eye drops).   Yes Historical Provider, MD  potassium gluconate (HM POTASSIUM) 595 (99 K) MG TABS tablet Take 595 mg by mouth daily. *May take an additional tablet as needed for cramping   Yes Historical Provider, MD  tiZANidine (ZANAFLEX) 4 MG tablet Take 4 mg by mouth 3 (three) times daily.    Yes Historical Provider, MD  meloxicam (MOBIC) 7.5 MG tablet Take 2 tablets (15 mg total) by mouth daily as needed for pain. 03/20/16   Virgel Manifold, MD  torsemide (DEMADEX) 20 MG tablet Take 2 tablets (40 mg total) by mouth 2 (two) times daily. Resume on 10/19 Patient taking differently: Take 40 mg by mouth daily as needed (fluid).  03/08/15   Kathie Dike, MD    Physical Exam:  Vitals:   03/22/16 0900 03/22/16 1130 03/22/16 1200 03/22/16 1230  BP: 140/80 144/86 141/82 141/91  Pulse: 82   65  Resp: 12 21 15 12   Temp:      TempSrc:      SpO2: 90%   95%  Weight:      Height:        Constitutional: NAD, calm, comfortable Eyes: PERRL, lids and conjunctivae normal ENMT: Mucous membranes are moist. Posterior pharynx clear of any exudate or lesions.Normal dentition.  Neck: normal, supple, no masses, no thyromegaly Respiratory: clear to auscultation bilaterally, no wheezing, no crackles. Normal respiratory effort. No accessory muscle use.  Cardiovascular: Regular rate and rhythm, no murmurs / rubs / gallops. No extremity edema. 2+ pedal pulses. No carotid bruits.  Abdomen: no tenderness, no masses palpated. No hepatosplenomegaly. Bowel sounds positive.  Musculoskeletal: no clubbing / cyanosis. No joint deformity upper and lower extremities. Good ROM, no contractures. Normal muscle tone.  Skin: no rashes, lesions, ulcers.  No induration Neurologic: CN 2-12 grossly intact. Sensation intact, DTR normal. Strength 5/5 in all 4.  Psychiatric: Normal judgment and insight. Alert and oriented x 3. Normal mood.   Labs on Admission: I have personally reviewed following labs and imaging studies  CBC:  Recent Labs Lab 03/22/16 0832  WBC 17.6*  NEUTROABS 15.3*  HGB 13.9  HCT 42.4  MCV 77.9*  PLT [id number]*   Basic Metabolic Panel:  Recent  Labs Lab 03/22/16 0832  NA 136  K 4.1  CL 101  CO2 30  GLUCOSE 123*  BUN 13  CREATININE 0.90  CALCIUM 9.4   GFR: Estimated Creatinine Clearance: 87.6 mL/min (by C-G formula based on SCr of 0.9 mg/dL). Liver Function Tests:  Recent Labs Lab 03/22/16 0832  AST 18  ALT 16*  ALKPHOS 63  BILITOT 1.0  PROT 6.4*  ALBUMIN 3.3*    Recent Labs Lab 03/22/16 0832  LIPASE 14   No results for input(s): AMMONIA in the last 168 hours. Coagulation Profile: No results for input(s): INR, PROTIME in the last 168 hours. Cardiac Enzymes: No results for input(s): CKTOTAL, CKMB, CKMBINDEX, TROPONINI in the last 168 hours. BNP (last 3 results) No results for input(s): PROBNP in the last 8760 hours. HbA1C: No results for input(s): HGBA1C in the last 72 hours. CBG: No results for input(s): GLUCAP in the last 168 hours. Lipid Profile: No results for input(s): CHOL, HDL, LDLCALC, TRIG, CHOLHDL, LDLDIRECT in the last 72 hours. Thyroid Function Tests: No results for input(s): TSH, T4TOTAL, FREET4, T3FREE, THYROIDAB in the last 72 hours. Anemia Panel: No results for input(s): VITAMINB12, FOLATE, FERRITIN, TIBC, IRON, RETICCTPCT in the last 72 hours. Urine analysis:    Component Value Date/Time   COLORURINE YELLOW 03/22/2016 0809   APPEARANCEUR CLEAR 03/22/2016 0809   LABSPEC 1.010 03/22/2016 0809   PHURINE 7.5 03/22/2016 0809   GLUCOSEU NEGATIVE 03/22/2016 0809   HGBUR NEGATIVE 03/22/2016 0809   BILIRUBINUR NEGATIVE 03/22/2016 0809   KETONESUR NEGATIVE 03/22/2016 0809    PROTEINUR NEGATIVE 03/22/2016 0809   UROBILINOGEN 0.2 09/01/2013 1312   NITRITE NEGATIVE 03/22/2016 0809   LEUKOCYTESUR NEGATIVE 03/22/2016 0809   Sepsis Labs: !!!!!!!!!!!!!!!!!!!!!!!!!!!!!!!!!!!!!!!!!!!! Invalid input(s): PROCALCITONIN, LACTICIDVEN No results found for this or any previous visit (from the past 240 hour(s)).   Radiological Exams on Admission: Dg Chest 2 View  Result Date: 03/22/2016 CLINICAL DATA:  abdominal distension. Dyspnea on exertion. Lower extremity edema. EXAM: CHEST  2 VIEW COMPARISON:  01/26/2016. FINDINGS: Lateral view degraded by patient arm position and artifact posteriorly. Midline trachea. Borderline cardiomegaly. Mediastinal contours otherwise within normal limits. Numerous leads and wires project over the chest. Frontal view is also mildly degraded by patient body habitus and portable technique. No pleural effusion or pneumothorax. Mild bibasilar atelectasis suspected. No congestive failure. IMPRESSION: Borderline cardiomegaly and mild bibasilar atelectasis. No acute findings. Electronically Signed   By: Abigail Miyamoto M.D.   On: 03/22/2016 09:42   Ct Head Wo Contrast  Result Date: 03/22/2016 CLINICAL DATA:  Severe diffuse headache. EXAM: CT HEAD WITHOUT CONTRAST TECHNIQUE: Contiguous axial images were obtained from the base of the skull through the vertex without intravenous contrast. COMPARISON:  03/02/2015 FINDINGS: Brain: There is no evidence of acute cortical infarct, intracranial hemorrhage, mass, midline shift, or extra-axial fluid collection. The ventricles and sulci are normal. Vascular: No hyperdense vessel or unexpected calcification. Skull: No fracture or focal osseous lesion. Sinuses/Orbits: Visualized paranasal sinuses and mastoid air cells are clear. Postoperative changes to the globes. Other: None. IMPRESSION: No evidence of acute intracranial abnormality. Electronically Signed   By: Logan Bores M.D.   On: 03/22/2016 10:00   Ct Angio Abd/pel W  And/or Wo Contrast  Result Date: 03/22/2016 CLINICAL DATA:  Low back pain that radiates to the right leg. Bilateral leg swelling. EXAM: CT ANGIOGRAPHY ABDOMEN AND PELVIS WITH CONTRAST AND WITHOUT CONTRAST TECHNIQUE: Multidetector CT imaging of the abdomen and pelvis was performed using the standard protocol during  bolus administration of intravenous contrast. Multiplanar reconstructed images and MIPs were obtained and reviewed to evaluate the vascular anatomy. CONTRAST:  100 mL Isovue 370 COMPARISON:  CT 06/09/2013 and abdominal CT 07/24/2010 FINDINGS: VASCULAR Aorta: Normal caliber of the abdominal aorta without dissection. Minimal atherosclerotic plaque in the distal abdominal aorta. No significant aortic stenosis. Celiac: Patent without evidence of aneurysm, dissection, vasculitis or significant stenosis. SMA: Patent without evidence of aneurysm, dissection, vasculitis or significant stenosis. Renals: Both renal arteries are patent without evidence of aneurysm, dissection, vasculitis, fibromuscular dysplasia or significant stenosis. IMA: Patent without evidence of aneurysm, dissection, vasculitis or significant stenosis. Inflow: Minimal calcified plaque in the iliac arteries. No significant stenosis. No evidence for aneurysm. Proximal Outflow: Bilateral common femoral and visualized portions of the superficial and profunda femoral arteries are Patent without evidence of aneurysm, dissection, vasculitis or significant stenosis. Veins: No obvious venous abnormality within the limitations of this arterial phase study. Review of the MIP images confirms the above findings. NON-VASCULAR Lower chest: Motion artifact at the lung bases. There is volume loss in the lower lobes. No large pleural effusions. Hepatobiliary: There are multiple areas of hyperdensity and presumed contrast enhancement throughout the liver. Some of these have a nodular appearance such as in the left hepatic lobe measuring 1.7 cm on sequence 6,  image 40. Some of these areas may represent transient hepatic attenuation differences. Gallbladder has been removed. Portal venous system appears to be patent but limited evaluation on this arterial phase of contrast. Pancreas: Normal appearance of the pancreas without inflammation or duct dilatation. Spleen: Normal appearance of spleen without enlargement. Adrenals/Urinary Tract: Normal adrenal glands. Urinary bladder is unremarkable. Evidence for bilateral renal cysts. No hydronephrosis. Stomach/Bowel: Normal appearance of the stomach and duodenum. There is laxity along the anterior abdominal wall. There is a large amount of bowel in the anterior upper abdomen. The cecum is located in the mid right abdomen. Normal appearance of the appendix. There is no evidence for bowel dilatation or obstruction. Lymphatic: No significant lymph node enlargement in the abdomen or pelvis. Reproductive: Prostate is unremarkable. Other: No free fluid. There is tiny umbilical hernia containing fat. Low attenuating structure in the right inguinal canal could be related to the right testicle but cannot exclude fluid in this area. Findings are similar to the prior CT. Musculoskeletal: Pedicle screw and rod fixation at L4-L5 with interbody device. IMPRESSION: VASCULAR Minimal atherosclerotic disease in the abdomen and pelvis. No evidence for dissection or aneurysm. NON-VASCULAR No acute abnormality in the abdomen or pelvis. Multiple hyperattenuating areas or structures throughout the liver. This could be related to transient hepatic attenuation differences during the arterial phase of imaging. However, some of these areas are nodular and cannot exclude small enhancing nodules. Consider a dedicated liver MRI to exclude liver lesions. Bilateral renal cysts. Electronically Signed   By: Markus Daft M.D.   On: 03/22/2016 10:22    EKG: Independently reviewed.   Assessment/Plan Principal Problem:   Acute on chronic diastolic CHF Active  Problems:   Hypothyroidism   Obesity   Edema of both legs    Acute on chronic diastolic CHF -History of chronic diastolic CHF with LVEF of 65-70% in 12/14.  -Came in with lower extremity edema and hypoxia. Exacerbation secondary to nonadherence to medications. -BNP is normal, x-ray per radiology no acute findings, but appears to be has bibasilar increased fluids. -Obtain 2-D echocardiogram, restrict fluid and salt intake. -Started on IV Lasix, follow daily weight, renal function and electrolytes.  Acute  hypoxic respiratory failure  -Oxygen saturation down to 82% on room air, patient required supplemental oxygen. -This is likely secondary to acute on chronic diastolic CHF, continue oxygen supplementation and diuresis.  Acute on chronic back pain -Has chronic muscular skeletal pain, had previous surgery in his back. -Complain about pain radiating to his right flank, CT of abdomen/pelvis without acute findings. -Control pain with opioid pain medications.  Leukocytosis -WBC is elevated at 17.6, this is being chronically elevated.  Obesity -Counseled about weight loss.  Edema of both legs -Secondary to lower extremity edema, there is hemosiderin staining of skin suggesting chronic changes and venous insufficiency.   DVT prophylaxis: SQ Heparin Code Status: Prior Full code Family Communication: Plan D/W patient Disposition Plan: Home Consults called:  Admission status: Observation/Tele   [facility] A MD [facility] Pager [phone]  If 7PM-7AM, please contact night-coverage [email or web address] Password [email or web address]  03/22/2016, 1:44 PM

## 2016-03-22 NOTE — ED Notes (Signed)
Attempted to call report, RN unavailable at this time.

## 2016-03-22 NOTE — ED Provider Notes (Signed)
[facility] DEPT Provider Note   CSN: [id number] Arrival date & time: 03/22/16  0735     History   Chief Complaint Chief Complaint  Patient presents with  . Abdominal Pain  . Back Pain    HPI Baylon Salb is a 75 y.o. male.  HPI 75 year old male who presents with multiple complaints, including abdominal pain, back pain, and shortness of breath. He has a history of degenerative disc disease with spinal stenosis, diastolic heart failure with venous insufficiency, nephrolithiasis, cholecystectomy, hypertension and hyperlipidemia. States that 3-4 days ago he had progressively worsening right flank pain radiating into right abdominal pain. States that he was seen in the ED for this 2 days ago. Has been taking oxycodone at home, without improvement of his pain. States that over the past 24 hours he's noticed increased abdominal distention, lower extremity edema, dyspnea on exertion. Denies any associating chest pain or upper back pain. This morning also woke up with severe diffuse headache in the setting of worsening flank and abdominal pain. No focal numbness or weakness. History of BPH, and states some difficulty urinating. No numbness. No bowel incontinence.    Past Medical History:  Diagnosis Date  . Bilateral lower leg cellulitis   . BPH (benign prostatic hyperplasia)   . Cataract    left  . CHF (congestive heart failure) ([place])   . Chronic diastolic HF (heart failure) ([place])   . Collagen vascular disease ([place])    history of venous insufficency and LE cellulitis  . Complication of anesthesia    2012 due to sleep apnea pt has had difficulty being put under  and waking up from anesthesia  . Deep venous insufficiency   . GERD (gastroesophageal reflux disease)   . Glaucoma    left  . Headache   . Heel spur    right heel  . History of bladder infections   . History of colonic polyps   . Hyperlipidemia   . Hypertension    Sees Dr. Debara Pickett  . Hypothyroidism    hypothyroid  denies no rx  . Kidney stones    passed them  . Obesity   . Osteoarthritis   . PONV (postoperative nausea and vomiting)   . PUD (peptic ulcer disease)   . Ringing in ears   . S/P colonoscopy 2007, Feb and March 2012   hx of adenomas, left-sided diverticula, On 07/2010 TCS, fresh blood and clot noted coming from TI.  . S/P endoscopy 2007, 2012   2007: nl, May 2012: antral erosions  . SIRS (systemic inflammatory response syndrome) ([place]) 02/27/2012  . Sleep apnea    uses cpap  occ "unable to use lately due to nasal pain when use" - sleep study 2008 AHI 48.80/hr and 56.70hr during REM  . Thrombocytopenia ([place]) 12/30/2011   Stable    Patient Active Problem List   Diagnosis Date Noted  . S/P nasal septoplasty 10/07/2015  . Parotitis 03/06/2015  . Chronic diastolic CHF (congestive heart failure) ([place]) 03/06/2015  . Parotitis, acute 03/06/2015  . Injection site irritation 03/06/2015  . Nausea without vomiting 12/08/2014  . Dysphagia, pharyngoesophageal phase   . History of colonic polyps   . Hypokalemia 09/01/2013  . Dehydration 09/01/2013  . Chest pain 09/01/2013  . Leukocytosis 09/01/2013  . Right-sided heart failure 08/20/2013  . Acute on chronic diastolic heart failure ([place]) 08/20/2013  . Acute on chronic diastolic CHF [id number]  . Diastolic dysfunction by echo Dec 2014 07/30/2013  . Morbid obesity- BMI 44.5 07/30/2013  .  Edema of both legs 07/30/2013  . Bilateral lower leg cellulitis 07/18/2013  . Pre-operative cardiovascular examination 06/12/2013  . 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 Right heart failure 11/27/2012  . OSA (obstructive sleep apnea) 11/27/2012  . Other spondylosis with radiculopathy, lumbar region 08/22/2012  . Bright red blood per rectum 06/15/2012  . Constipation 02/29/2012  . Edema 02/29/2012  . SIRS (systemic inflammatory response syndrome) ([place]) 02/27/2012  . UTI (lower urinary tract infection) 02/27/2012  . BPH (benign prostatic  hyperplasia) 02/27/2012  . Obesity 02/27/2012  . Hyperlipidemia 02/27/2012  . Thrombocytopenia ([place]) 12/30/2011  . Hypothyroidism 10/27/2011  . Upper abdominal pain 10/27/2011  . Anorexia 10/27/2011  . Bowel habit changes 10/27/2011  . Constipation 04/20/2011  . Rectal bleeding 11/16/2010  . NAUSEA 06/08/2010  . DIARRHEA 06/08/2010  . COLONIC POLYPS, ADENOMATOUS, HX OF 07/16/2009  . Essential hypertension 06/19/2009  . GASTROESOPHAGEAL REFLUX DISEASE, CHRONIC 06/19/2009  . FATTY LIVER DISEASE 06/19/2009  . CHOLELITHIASIS, SYMPTOMATIC 06/19/2009  . RENAL INSUFFICIENCY 06/19/2009  . ABDOMINAL BLOATING 06/19/2009    Past Surgical History:  Procedure Laterality Date  . BACK SURGERY    . CARDIAC CATHETERIZATION  02/27/2007   no sign of CAD, LVH, mod pulm HTN (Dr. Jackie Plum(  . CATARACT EXTRACTION W/ INTRAOCULAR LENS IMPLANT Right ~ 2012  . COLONOSCOPY N/A 05/28/2014   [id number] colonic polyps/colonic diverticulosis. Tubular adenomas. Next colonoscopy January 2021  . ESOPHAGOGASTRODUODENOSCOPY  Aug 2013   [facility], single 69mm pedunculated polyp otherwise normal. HYPERPLASTIC, NEGATIVE h.pylori  . ESOPHAGOGASTRODUODENOSCOPY N/A 05/28/2014   RMR:non-critical Schatzki's ring/HH. Benign gastritis  . INGUINAL HERNIA REPAIR Right 1941  . KNEE ARTHROSCOPY Left ~ 2000  . LAPAROSCOPIC CHOLECYSTECTOMY    . [place] SURGERY  2012  . MALONEY DILATION N/A 05/28/2014   Procedure: Venia Minks DILATION;  Surgeon: Daneil Dolin, MD;  Location: [facility];  Service: Endoscopy;  Laterality: N/A;  . NASAL SEPTOPLASTY W/ TURBINOPLASTY Bilateral 10/07/2015   Procedure: NASAL SEPTOPLASTY WITH TURBINATE REDUCTION;  Surgeon: Leta Baptist, MD;  Location: [facility];  Service: ENT;  Laterality: Bilateral;  . NASAL SEPTUM SURGERY Bilateral 10/07/2015    inferior turbinate resection  . NM MYOCAR PERF WALL MOTION  12/2006   dipyridamole myoview - stress images show mild perfusion defect in mid inferior walls with reversibility  at rest; mild perfusion defect in mid inferolateral wall at stress with mild defect reversibility, EF 62%, abnormal but low risk study   . POSTERIOR LUMBAR FUSION  2015  . SAVORY DILATION N/A 05/28/2014   Procedure: SAVORY DILATION;  Surgeon: Daneil Dolin, MD;  Location: [facility];  Service: Endoscopy;  Laterality: N/A;  . SHOULDER OPEN ROTATOR CUFF REPAIR Left early 2000s  . small bowel capsule study  07/2011   ?transient focal ischemia in distal small bowel to explain GI bleeding  . TRANSTHORACIC ECHOCARDIOGRAM  09/2008   EF 60-65%, mod conc LVH  . TRANSURETHRAL RESECTION OF PROSTATE    . TRANSURETHRAL RESECTION OF PROSTATE N/A 06/18/2013   Procedure: TRANSURETHRAL RESECTION OF THE PROSTATE (TURP);  Surgeon: Marissa Nestle, MD;  Location: [facility];  Service: Urology;  Laterality: N/A;       Home Medications    Prior to Admission medications   Medication Sig Start Date End Date Taking? Authorizing Provider  acetaminophen (TYLENOL) 325 MG tablet Take 650 mg by mouth every 6 (six) hours as needed.   Yes Historical Provider, MD  amLODipine (NORVASC) 5 MG tablet Take 5  mg by mouth daily.   Yes Historical Provider, MD  calcium citrate (CALCITRATE - DOSED IN MG ELEMENTAL CALCIUM) 950 MG tablet Take 200 mg of elemental calcium by mouth daily.   Yes Historical Provider, MD  Cholecalciferol (VITAMIN D3) 1000 units CAPS Take 1 capsule by mouth daily.    Yes Historical Provider, MD  docusate sodium (COLACE) 100 MG capsule Take 200 mg by mouth daily as needed for mild constipation. Reported on 09/11/2015   Yes Historical Provider, MD  dorzolamide-timolol (COSOPT) 22.3-6.8 MG/ML ophthalmic solution Place 1 drop into both eyes 2 (two) times daily. 12/17/14  Yes Historical Provider, MD  ferrous sulfate 325 (65 FE) MG tablet Take 325 mg by mouth daily with breakfast.   Yes Historical Provider, MD  Ginger, Zingiber officinalis, (GINGER ROOT PO) Take 1 tablet by mouth 3 (three) times daily.    Yes  Historical Provider, MD  latanoprost (XALATAN) 0.005 % ophthalmic solution Place 1 drop into both eyes at bedtime.   Yes Historical Provider, MD  magnesium oxide (MAG-OX) 400 MG tablet Take 400 mg by mouth daily.   Yes Historical Provider, MD  metoprolol tartrate (LOPRESSOR) 25 MG tablet Take 25 mg by mouth daily.   Yes Historical Provider, MD  Oxycodone HCl 10 MG TABS Take 10 mg by mouth 4 (four) times daily as needed (pain).   Yes Historical Provider, MD  pantoprazole (PROTONIX) 40 MG tablet Take 40 mg by mouth 2 (two) times daily.   Yes Historical Provider, MD  Polyethyl Glycol-Propyl Glycol (LUBRICANT EYE DROPS) 0.4-0.3 % SOLN Apply 1 drop to eye daily as needed (for lubricant eye drops).   Yes Historical Provider, MD  potassium gluconate (HM POTASSIUM) 595 (99 K) MG TABS tablet Take 595 mg by mouth daily. *May take an additional tablet as needed for cramping   Yes Historical Provider, MD  tiZANidine (ZANAFLEX) 4 MG tablet Take 4 mg by mouth 3 (three) times daily.    Yes Historical Provider, MD  meloxicam (MOBIC) 7.5 MG tablet Take 2 tablets (15 mg total) by mouth daily as needed for pain. 03/20/16   Virgel Manifold, MD  torsemide (DEMADEX) 20 MG tablet Take 2 tablets (40 mg total) by mouth 2 (two) times daily. Resume on 10/19 Patient taking differently: Take 40 mg by mouth daily as needed (fluid).  03/08/15   Kathie Dike, MD    Family History Family History  Problem Relation Age of Onset  . Colon cancer Father 82    deceased  . Prostate cancer Father   . Pancreatic cancer Mother 28    deceased  . Prostate cancer Brother   . Arthritis Son   . Arthritis Son   . Diabetes Mellitus II Son     Social History Social History  Substance Use Topics  . Smoking status: Former Smoker    Years: 8.00    Types: Cigars    Start date: 11/20/1959    Quit date: 08/10/1973  . Smokeless tobacco: Never Used  . Alcohol use 0.0 oz/week     Comment: 10/07/2015 "drank a little bit; stopped in ~ 1975"       Allergies   Aspirin and Nexium [esomeprazole magnesium]   Review of Systems Review of Systems 10/14 systems reviewed and are negative other than those stated in the HPI   Physical Exam Updated Vital Signs BP 140/80   Pulse 82   Temp 98.3 F (36.8 C) (Oral)   Resp 12   Ht 5\' 6"  (1.676 m)  Wt 278 lb (126.1 kg)   SpO2 90%   BMI 44.87 kg/m   Physical Exam Physical Exam  Nursing note and vitals reviewed. Constitutional: Appears uncomfortable secondary to pain, non-toxic, and in no acute distress Head: Normocephalic and atraumatic.  Mouth/Throat: Oropharynx is clear and moist.  Neck: Normal range of motion. Neck supple.  Cardiovascular: Normal rate and regular rhythm.   Pulmonary/Chest: Effort normal and breath sounds normal.  Abdominal: Soft. There is no tenderness. There is no rebound and no guarding. right CVA tenderness Musculoskeletal: Normal range of motion. no deformities Neurological: Alert, no facial droop, fluent speech, moves all extremities symmetrically Skin: Skin is warm and dry.  Psychiatric: Cooperative   ED Treatments / Results  Labs (all labs ordered are listed, but only abnormal results are displayed) Labs Reviewed  CBC WITH DIFFERENTIAL/PLATELET - Abnormal; Notable for the following:       Result Value   WBC 17.6 (*)    MCV 77.9 (*)    MCH 25.6 (*)    RDW 18.2 (*)    Platelets 143 (*)    Neutro Abs 15.3 (*)    All other components within normal limits  COMPREHENSIVE METABOLIC PANEL - Abnormal; Notable for the following:    Glucose, Bld 123 (*)    Total Protein 6.4 (*)    Albumin 3.3 (*)    ALT 16 (*)    All other components within normal limits  LIPASE, BLOOD  URINALYSIS, ROUTINE W REFLEX MICROSCOPIC (NOT AT [facility])  BRAIN NATRIURETIC PEPTIDE  I-STAT TROPOININ, ED    EKG  EKG Interpretation None       Radiology Dg Chest 2 View  Result Date: 03/22/2016 CLINICAL DATA:  abdominal distension. Dyspnea on exertion. Lower  extremity edema. EXAM: CHEST  2 VIEW COMPARISON:  01/26/2016. FINDINGS: Lateral view degraded by patient arm position and artifact posteriorly. Midline trachea. Borderline cardiomegaly. Mediastinal contours otherwise within normal limits. Numerous leads and wires project over the chest. Frontal view is also mildly degraded by patient body habitus and portable technique. No pleural effusion or pneumothorax. Mild bibasilar atelectasis suspected. No congestive failure. IMPRESSION: Borderline cardiomegaly and mild bibasilar atelectasis. No acute findings. Electronically Signed   By: Abigail Miyamoto M.D.   On: 03/22/2016 09:42   Ct Head Wo Contrast  Result Date: 03/22/2016 CLINICAL DATA:  Severe diffuse headache. EXAM: CT HEAD WITHOUT CONTRAST TECHNIQUE: Contiguous axial images were obtained from the base of the skull through the vertex without intravenous contrast. COMPARISON:  03/02/2015 FINDINGS: Brain: There is no evidence of acute cortical infarct, intracranial hemorrhage, mass, midline shift, or extra-axial fluid collection. The ventricles and sulci are normal. Vascular: No hyperdense vessel or unexpected calcification. Skull: No fracture or focal osseous lesion. Sinuses/Orbits: Visualized paranasal sinuses and mastoid air cells are clear. Postoperative changes to the globes. Other: None. IMPRESSION: No evidence of acute intracranial abnormality. Electronically Signed   By: Logan Bores M.D.   On: 03/22/2016 10:00   Ct Angio Abd/pel W And/or Wo Contrast  Result Date: 03/22/2016 CLINICAL DATA:  Low back pain that radiates to the right leg. Bilateral leg swelling. EXAM: CT ANGIOGRAPHY ABDOMEN AND PELVIS WITH CONTRAST AND WITHOUT CONTRAST TECHNIQUE: Multidetector CT imaging of the abdomen and pelvis was performed using the standard protocol during bolus administration of intravenous contrast. Multiplanar reconstructed images and MIPs were obtained and reviewed to evaluate the vascular anatomy. CONTRAST:  100 mL  Isovue 370 COMPARISON:  CT 06/09/2013 and abdominal CT 07/24/2010 FINDINGS: VASCULAR Aorta: Normal caliber  of the abdominal aorta without dissection. Minimal atherosclerotic plaque in the distal abdominal aorta. No significant aortic stenosis. Celiac: Patent without evidence of aneurysm, dissection, vasculitis or significant stenosis. SMA: Patent without evidence of aneurysm, dissection, vasculitis or significant stenosis. Renals: Both renal arteries are patent without evidence of aneurysm, dissection, vasculitis, fibromuscular dysplasia or significant stenosis. IMA: Patent without evidence of aneurysm, dissection, vasculitis or significant stenosis. Inflow: Minimal calcified plaque in the iliac arteries. No significant stenosis. No evidence for aneurysm. Proximal Outflow: Bilateral common femoral and visualized portions of the superficial and profunda femoral arteries are Patent without evidence of aneurysm, dissection, vasculitis or significant stenosis. Veins: No obvious venous abnormality within the limitations of this arterial phase study. Review of the MIP images confirms the above findings. NON-VASCULAR Lower chest: Motion artifact at the lung bases. There is volume loss in the lower lobes. No large pleural effusions. Hepatobiliary: There are multiple areas of hyperdensity and presumed contrast enhancement throughout the liver. Some of these have a nodular appearance such as in the left hepatic lobe measuring 1.7 cm on sequence 6, image 40. Some of these areas may represent transient hepatic attenuation differences. Gallbladder has been removed. Portal venous system appears to be patent but limited evaluation on this arterial phase of contrast. Pancreas: Normal appearance of the pancreas without inflammation or duct dilatation. Spleen: Normal appearance of spleen without enlargement. Adrenals/Urinary Tract: Normal adrenal glands. Urinary bladder is unremarkable. Evidence for bilateral renal cysts. No  hydronephrosis. Stomach/Bowel: Normal appearance of the stomach and duodenum. There is laxity along the anterior abdominal wall. There is a large amount of bowel in the anterior upper abdomen. The cecum is located in the mid right abdomen. Normal appearance of the appendix. There is no evidence for bowel dilatation or obstruction. Lymphatic: No significant lymph node enlargement in the abdomen or pelvis. Reproductive: Prostate is unremarkable. Other: No free fluid. There is tiny umbilical hernia containing fat. Low attenuating structure in the right inguinal canal could be related to the right testicle but cannot exclude fluid in this area. Findings are similar to the prior CT. Musculoskeletal: Pedicle screw and rod fixation at L4-L5 with interbody device. IMPRESSION: VASCULAR Minimal atherosclerotic disease in the abdomen and pelvis. No evidence for dissection or aneurysm. NON-VASCULAR No acute abnormality in the abdomen or pelvis. Multiple hyperattenuating areas or structures throughout the liver. This could be related to transient hepatic attenuation differences during the arterial phase of imaging. However, some of these areas are nodular and cannot exclude small enhancing nodules. Consider a dedicated liver MRI to exclude liver lesions. Bilateral renal cysts. Electronically Signed   By: Markus Daft M.D.   On: 03/22/2016 10:22    Procedures Procedures (including critical care time)  Medications Ordered in ED Medications  furosemide (LASIX) injection 40 mg (not administered)  HYDROmorphone (DILAUDID) injection 0.5 mg (not administered)  HYDROmorphone (DILAUDID) injection 1 mg (1 mg Intravenous Given 03/22/16 0831)  ondansetron (ZOFRAN) injection 4 mg (4 mg Intravenous Given 03/22/16 0831)  iopamidol (ISOVUE-370) 76 % injection 100 mL (100 mLs Intravenous Contrast Given 03/22/16 0926)     Initial Impression / Assessment and Plan / ED Course  I have reviewed the triage vital signs and the nursing  notes.  Pertinent labs & imaging results that were available during my care of the patient were reviewed by me and considered in my medical decision making (see chart for details).  Clinical Course   Patient is presenting with evidence of acute on chronic diastolic heart failure.  Looks fluid overloaded on exam with bilateral lower extremity edema, abdominal distention, and pulse ox between 88-93% on room air. Chest x-ray is clear and BMP is normal, but clinically appears to have CHF exacerbation. Given IV Lasix in the ED.  In regards to his right flank pain and abdominal pain, concern for nephrolithiasis/ureterolithiasis, aortic pathology, appendicitis, or other acute intra-abdominal process. Basic blood work is notable for leukocytosis of 17, but this seems constant for him over the past several months. No evidence of acute hepatobiliary disease on CMP, normal lipase, normal urinalysis. CT abdomen pelvis performed, visualized, and shows no acute intra-abdominal or retroperitoneal process. On chart review, he has known degenerative disc disease and spinal stenosis on MRI in September 2017. This is likely due to pain related to that. Attempted pain control with dilaudid, improved but unable to sit up and ambulate here.  Discussed with Dr. Hartford Poli from hospitalist service. Plan to admit for acute on chronic diastolic heart failure and pain control for acute on chronic back pain.   Final Clinical Impressions(s) / ED Diagnoses   Final diagnoses:  Acute right-sided low back pain with right-sided sciatica  Acute on chronic diastolic heart failure [facility])    New Prescriptions New Prescriptions   No medications on file     Forde Dandy, MD 03/22/16 1101

## 2016-03-23 ENCOUNTER — Observation Stay (HOSPITAL_COMMUNITY): Payer: Medicare Other

## 2016-03-23 DIAGNOSIS — N179 Acute kidney failure, unspecified: Secondary | ICD-10-CM | POA: Diagnosis present

## 2016-03-23 DIAGNOSIS — M5441 Lumbago with sciatica, right side: Secondary | ICD-10-CM | POA: Diagnosis present

## 2016-03-23 DIAGNOSIS — Z9114 Patient's other noncompliance with medication regimen: Secondary | ICD-10-CM | POA: Diagnosis not present

## 2016-03-23 DIAGNOSIS — I872 Venous insufficiency (chronic) (peripheral): Secondary | ICD-10-CM | POA: Diagnosis present

## 2016-03-23 DIAGNOSIS — E039 Hypothyroidism, unspecified: Secondary | ICD-10-CM | POA: Diagnosis present

## 2016-03-23 DIAGNOSIS — J9601 Acute respiratory failure with hypoxia: Secondary | ICD-10-CM | POA: Diagnosis present

## 2016-03-23 DIAGNOSIS — G4730 Sleep apnea, unspecified: Secondary | ICD-10-CM | POA: Diagnosis present

## 2016-03-23 DIAGNOSIS — Z87442 Personal history of urinary calculi: Secondary | ICD-10-CM | POA: Diagnosis not present

## 2016-03-23 DIAGNOSIS — I5033 Acute on chronic diastolic (congestive) heart failure: Secondary | ICD-10-CM

## 2016-03-23 DIAGNOSIS — I509 Heart failure, unspecified: Secondary | ICD-10-CM | POA: Diagnosis not present

## 2016-03-23 DIAGNOSIS — Z6841 Body Mass Index (BMI) 40.0 and over, adult: Secondary | ICD-10-CM | POA: Diagnosis not present

## 2016-03-23 DIAGNOSIS — E785 Hyperlipidemia, unspecified: Secondary | ICD-10-CM | POA: Diagnosis present

## 2016-03-23 DIAGNOSIS — R600 Localized edema: Secondary | ICD-10-CM | POA: Diagnosis present

## 2016-03-23 DIAGNOSIS — D72829 Elevated white blood cell count, unspecified: Secondary | ICD-10-CM | POA: Diagnosis present

## 2016-03-23 DIAGNOSIS — K219 Gastro-esophageal reflux disease without esophagitis: Secondary | ICD-10-CM | POA: Diagnosis present

## 2016-03-23 DIAGNOSIS — N400 Enlarged prostate without lower urinary tract symptoms: Secondary | ICD-10-CM | POA: Diagnosis present

## 2016-03-23 DIAGNOSIS — E876 Hypokalemia: Secondary | ICD-10-CM | POA: Diagnosis present

## 2016-03-23 DIAGNOSIS — Z87440 Personal history of urinary (tract) infections: Secondary | ICD-10-CM | POA: Diagnosis not present

## 2016-03-23 DIAGNOSIS — R0902 Hypoxemia: Secondary | ICD-10-CM | POA: Diagnosis not present

## 2016-03-23 DIAGNOSIS — Z8711 Personal history of peptic ulcer disease: Secondary | ICD-10-CM | POA: Diagnosis not present

## 2016-03-23 DIAGNOSIS — K5900 Constipation, unspecified: Secondary | ICD-10-CM | POA: Diagnosis present

## 2016-03-23 DIAGNOSIS — Z86010 Personal history of colonic polyps: Secondary | ICD-10-CM | POA: Diagnosis not present

## 2016-03-23 DIAGNOSIS — H409 Unspecified glaucoma: Secondary | ICD-10-CM | POA: Diagnosis present

## 2016-03-23 DIAGNOSIS — I110 Hypertensive heart disease with heart failure: Secondary | ICD-10-CM | POA: Diagnosis present

## 2016-03-23 DIAGNOSIS — G8929 Other chronic pain: Secondary | ICD-10-CM | POA: Diagnosis present

## 2016-03-23 LAB — CBC
HCT: 42 % (ref 39.0–52.0)
HEMOGLOBIN: 13.3 g/dL (ref 13.0–17.0)
MCH: 25 pg — ABNORMAL LOW (ref 26.0–34.0)
MCHC: 31.7 g/dL (ref 30.0–36.0)
MCV: 78.8 fL (ref 78.0–100.0)
Platelets: 126 10*3/uL — ABNORMAL LOW (ref 150–400)
RBC: 5.33 MIL/uL (ref 4.22–5.81)
RDW: 18.4 % — ABNORMAL HIGH (ref 11.5–15.5)
WBC: 13.8 10*3/uL — ABNORMAL HIGH (ref 4.0–10.5)

## 2016-03-23 LAB — BASIC METABOLIC PANEL
ANION GAP: 6 (ref 5–15)
BUN: 18 mg/dL (ref 6–20)
CALCIUM: 9.5 mg/dL (ref 8.9–10.3)
CO2: 34 mmol/L — ABNORMAL HIGH (ref 22–32)
Chloride: 97 mmol/L — ABNORMAL LOW (ref 101–111)
Creatinine, Ser: 1.05 mg/dL (ref 0.61–1.24)
GLUCOSE: 113 mg/dL — AB (ref 65–99)
Potassium: 3.7 mmol/L (ref 3.5–5.1)
Sodium: 137 mmol/L (ref 135–145)

## 2016-03-23 LAB — HEMOGLOBIN A1C
HEMOGLOBIN A1C: 5.5 % (ref 4.8–5.6)
MEAN PLASMA GLUCOSE: 111 mg/dL

## 2016-03-23 MED ORDER — DORZOLAMIDE HCL-TIMOLOL MAL 2-0.5 % OP SOLN
1.0000 [drp] | Freq: Two times a day (BID) | OPHTHALMIC | Status: DC
  Administered 2016-03-23 – 2016-03-26 (×5): 1 [drp] via OPHTHALMIC
  Filled 2016-03-23: qty 10

## 2016-03-23 NOTE — Care Management Obs Status (Signed)
[facility] NOTIFICATION   Patient Details  Name: Navid Fudge MRN: [id number] Date of Birth: 1940-09-12   Medicare Observation Status Notification Given:  Yes    Sherald Barge, RN 03/23/2016, 9:31 AM

## 2016-03-23 NOTE — Progress Notes (Signed)
PROGRESS NOTE                                                                                                                                                                                                             Patient Demographics:    [name] [name], is a 75 y.o. male, DOB - 07/19/1940, [id number]  Admit date - 03/22/2016   Admitting Physician Verlee Monte, MD  Outpatient Primary MD for the patient is [name]., MD  LOS - 0  Chief Complaint  Patient presents with  . Abdominal Pain  . Back Pain       Brief Narrative   [name] [name] is a 75 y.o. male with medical history significant of diastolic CHF, chronic back pain, hypertension and hypothyroidism. Patient came into the hospital because of back pain and lower extremity edema. Patient has chronic pain and he is taking opioid pain medication for that at home, for the past several days he was having more pain so he came into the ED several days ago when he was discharged with more pain medicines. While he was having more back pain he started to take less diuretics to minimize his movement out of bed, he noticed that his legs started to swell more than usual and he started to get short of breath so he came to the hospital for both back pain and lower extremity swelling.     Subjective:    [name] [name] today has, No headache, No chest pain, No abdominal pain - No Nausea, No new weakness tingling or numbness, No Cough - Improved SOB.  Chr R.Lower back pain   Assessment  & Plan :    1.Acute hypoxic respiratory failure due to acute on chronic diastolic CHF, last EF [id number] in 2014. Patient much improved with IV Lasix which will be continued, add TEDs, repeat 2-D echocardiogram pending, placed on salt and fluid restriction, continue beta blocker along with daily weights and intake and output monitoring. So far 1 L negative.  Filed Weights   03/22/16 0804  03/22/16 1410 03/23/16 0453  Weight: 126.1 kg (278 lb) 128.2 kg (282 lb 10.1 oz) 127.7 kg (281 lb 8 oz)     2. Bilateral lower extremity edema due to #1 above. Treatment as in #1 above.  3. Chronic right-sided lower back pain. Status post L-spine surgery in  the past, patient takes anesthetic shots every 2 weeks for the last several years, he was due for a short today, in control now, outpatient follow-up with this pain physician post discharge. No neurological deficits. No tenderness on palpation.  4. Chronic leukocytosis. Follow with PCP.  5. Morbid obesity. Follow with PCP for weight loss.  6. Questionable liver nodularity. Patient unable to fit in our local MRI, outpatient MRI at [facility] by PCP.    Family Communication  :  wife  Code Status :  Full  Diet :  Herat Healthy  Disposition Plan  :  Home 2-3 days  Consults  :  None  Procedures  :    TTE  DVT Prophylaxis  :    Heparin    Lab Results  Component Value Date   PLT 126 (L) 03/23/2016    Inpatient Medications  Scheduled Meds: . calcium carbonate  500 mg of elemental calcium Oral Daily  . cholecalciferol  1,000 Units Oral Daily  . ferrous sulfate  325 mg Oral Q breakfast  . furosemide  80 mg Intravenous BID  . heparin  5,000 Units Subcutaneous Q8H  . latanoprost  1 drop Both Eyes QHS  . magnesium oxide  400 mg Oral Daily  . metoprolol tartrate  25 mg Oral Daily  . pantoprazole  40 mg Oral BID  . sodium chloride flush  3 mL Intravenous Q12H  . tiZANidine  4 mg Oral TID   Continuous Infusions: . sodium chloride     PRN Meds:.acetaminophen **OR** acetaminophen, docusate sodium, HYDROcodone-acetaminophen, ondansetron **OR** ondansetron (ZOFRAN) IV, polyvinyl alcohol  Antibiotics  :    Anti-infectives    None         Objective:   Vitals:   03/22/16 1410 03/22/16 1920 03/22/16 2224 03/23/16 0453  BP: 138/67  112/65 123/64  Pulse: 79  64 65  Resp: 20  20 20   Temp: 98 F (36.7 C)  97.6 F (36.4 C)  97.8 F (36.6 C)  TempSrc: Oral  Oral Oral  SpO2: 93% (!) 88% 96% 97%  Weight: 128.2 kg (282 lb 10.1 oz)   127.7 kg (281 lb 8 oz)  Height: 5\' 6"  (1.676 m)       Wt Readings from Last 3 Encounters:  03/23/16 127.7 kg (281 lb 8 oz)  03/20/16 122.5 kg (270 lb)  02/10/16 117.9 kg (260 lb)     Intake/Output Summary (Last 24 hours) at 03/23/16 0907 Last data filed at 03/22/16 2100  Gross per 24 hour  Intake              480 ml  Output             1550 ml  Net            -1070 ml     Physical Exam  Awake Alert, Oriented X 3, No new F.N deficits, Normal affect [place].AT,PERRAL Supple Neck,No JVD, No cervical lymphadenopathy appriciated.  Symmetrical Chest wall movement, Good air movement bilaterally, +ve rales RRR,No Gallops,Rubs or new Murmurs, No Parasternal Heave +ve B.Sounds, Abd Soft, No tenderness, No organomegaly appriciated, No rebound - guarding or rigidity. No Cyanosis, Clubbing , 2+ leg edema, No new Rash or bruise      Data Review:    CBC  Recent Labs Lab 03/22/16 0832 03/23/16 0554  WBC 17.6* 13.8*  HGB 13.9 13.3  HCT 42.4 42.0  PLT 143* 126*  MCV 77.9* 78.8  MCH 25.6* 25.0*  MCHC 32.8 31.7  RDW 18.2* 18.4*  LYMPHSABS 1.7  --   MONOABS 0.6  --   EOSABS 0.0  --   BASOSABS 0.0  --     Chemistries   Recent Labs Lab 03/22/16 0832 03/23/16 0554  NA 136 137  K 4.1 3.7  CL 101 97*  CO2 30 34*  GLUCOSE 123* 113*  BUN 13 18  CREATININE 0.90 1.05  CALCIUM 9.4 9.5  AST 18  --   ALT 16*  --   ALKPHOS 63  --   BILITOT 1.0  --    ------------------------------------------------------------------------------------------------------------------ No results for input(s): CHOL, HDL, LDLCALC, TRIG, CHOLHDL, LDLDIRECT in the last 72 hours.  Lab Results  Component Value Date   HGBA1C 5.5 03/22/2016   ------------------------------------------------------------------------------------------------------------------  Recent Labs  03/22/16 0832  TSH  1.074   ------------------------------------------------------------------------------------------------------------------ No results for input(s): VITAMINB12, FOLATE, FERRITIN, TIBC, IRON, RETICCTPCT in the last 72 hours.  Coagulation profile  Recent Labs Lab 03/22/16 1509  INR 0.97    No results for input(s): DDIMER in the last 72 hours.  Cardiac Enzymes No results for input(s): CKMB, TROPONINI, MYOGLOBIN in the last 168 hours.  Invalid input(s): CK ------------------------------------------------------------------------------------------------------------------    Component Value Date/Time   BNP 24.0 03/22/2016 0832   BNP 4.1 08/30/2013 0940    Micro Results No results found for this or any previous visit (from the past 240 hour(s)).  Radiology Reports Dg Chest 2 View  Result Date: 03/22/2016 CLINICAL DATA:  abdominal distension. Dyspnea on exertion. Lower extremity edema. EXAM: CHEST  2 VIEW COMPARISON:  01/26/2016. FINDINGS: Lateral view degraded by patient arm position and artifact posteriorly. Midline trachea. Borderline cardiomegaly. Mediastinal contours otherwise within normal limits. Numerous leads and wires project over the chest. Frontal view is also mildly degraded by patient body habitus and portable technique. No pleural effusion or pneumothorax. Mild bibasilar atelectasis suspected. No congestive failure. IMPRESSION: Borderline cardiomegaly and mild bibasilar atelectasis. No acute findings. Electronically Signed   By: Abigail Miyamoto M.D.   On: 03/22/2016 09:42   Ct Head Wo Contrast  Result Date: 03/22/2016 CLINICAL DATA:  Severe diffuse headache. EXAM: CT HEAD WITHOUT CONTRAST TECHNIQUE: Contiguous axial images were obtained from the base of the skull through the vertex without intravenous contrast. COMPARISON:  03/02/2015 FINDINGS: Brain: There is no evidence of acute cortical infarct, intracranial hemorrhage, mass, midline shift, or extra-axial fluid collection.  The ventricles and sulci are normal. Vascular: No hyperdense vessel or unexpected calcification. Skull: No fracture or focal osseous lesion. Sinuses/Orbits: Visualized paranasal sinuses and mastoid air cells are clear. Postoperative changes to the globes. Other: None. IMPRESSION: No evidence of acute intracranial abnormality. Electronically Signed   By: Logan Bores M.D.   On: 03/22/2016 10:00   Ct Angio Abd/pel W And/or Wo Contrast  Result Date: 03/22/2016 CLINICAL DATA:  Low back pain that radiates to the right leg. Bilateral leg swelling. EXAM: CT ANGIOGRAPHY ABDOMEN AND PELVIS WITH CONTRAST AND WITHOUT CONTRAST TECHNIQUE: Multidetector CT imaging of the abdomen and pelvis was performed using the standard protocol during bolus administration of intravenous contrast. Multiplanar reconstructed images and MIPs were obtained and reviewed to evaluate the vascular anatomy. CONTRAST:  100 mL Isovue 370 COMPARISON:  CT 06/09/2013 and abdominal CT 07/24/2010 FINDINGS: VASCULAR Aorta: Normal caliber of the abdominal aorta without dissection. Minimal atherosclerotic plaque in the distal abdominal aorta. No significant aortic stenosis. Celiac: Patent without evidence of aneurysm, dissection, vasculitis or significant stenosis. SMA: Patent without evidence of aneurysm, dissection, vasculitis or significant stenosis.  Renals: Both renal arteries are patent without evidence of aneurysm, dissection, vasculitis, fibromuscular dysplasia or significant stenosis. IMA: Patent without evidence of aneurysm, dissection, vasculitis or significant stenosis. Inflow: Minimal calcified plaque in the iliac arteries. No significant stenosis. No evidence for aneurysm. Proximal Outflow: Bilateral common femoral and visualized portions of the superficial and profunda femoral arteries are Patent without evidence of aneurysm, dissection, vasculitis or significant stenosis. Veins: No obvious venous abnormality within the limitations of this  arterial phase study. Review of the MIP images confirms the above findings. NON-VASCULAR Lower chest: Motion artifact at the lung bases. There is volume loss in the lower lobes. No large pleural effusions. Hepatobiliary: There are multiple areas of hyperdensity and presumed contrast enhancement throughout the liver. Some of these have a nodular appearance such as in the left hepatic lobe measuring 1.7 cm on sequence 6, image 40. Some of these areas may represent transient hepatic attenuation differences. Gallbladder has been removed. Portal venous system appears to be patent but limited evaluation on this arterial phase of contrast. Pancreas: Normal appearance of the pancreas without inflammation or duct dilatation. Spleen: Normal appearance of spleen without enlargement. Adrenals/Urinary Tract: Normal adrenal glands. Urinary bladder is unremarkable. Evidence for bilateral renal cysts. No hydronephrosis. Stomach/Bowel: Normal appearance of the stomach and duodenum. There is laxity along the anterior abdominal wall. There is a large amount of bowel in the anterior upper abdomen. The cecum is located in the mid right abdomen. Normal appearance of the appendix. There is no evidence for bowel dilatation or obstruction. Lymphatic: No significant lymph node enlargement in the abdomen or pelvis. Reproductive: Prostate is unremarkable. Other: No free fluid. There is tiny umbilical hernia containing fat. Low attenuating structure in the right inguinal canal could be related to the right testicle but cannot exclude fluid in this area. Findings are similar to the prior CT. Musculoskeletal: Pedicle screw and rod fixation at L4-L5 with interbody device. IMPRESSION: VASCULAR Minimal atherosclerotic disease in the abdomen and pelvis. No evidence for dissection or aneurysm. NON-VASCULAR No acute abnormality in the abdomen or pelvis. Multiple hyperattenuating areas or structures throughout the liver. This could be related to  transient hepatic attenuation differences during the arterial phase of imaging. However, some of these areas are nodular and cannot exclude small enhancing nodules. Consider a dedicated liver MRI to exclude liver lesions. Bilateral renal cysts. Electronically Signed   By: Markus Daft M.D.   On: 03/22/2016 10:22    Time Spent in minutes  30   SINGH,PRASHANT K M.D on 03/23/2016 at 9:07 AM  Between 7am to 7pm - Pager - [phone]  After 7pm go to [email or web address] - password [facility]  [facility] -  Office  [phone]

## 2016-03-23 NOTE — Care Management Note (Signed)
Case Management Note  Patient Details  Name: [name] [name] MRN: [id number] Date of Birth: 1940/12/31  Subjective/Objective:                  Pt admitted with CHF. Pt is from home, lives with spouse and has strong family support. Pt is ind with ADL's. He has no HH services or DME needs PTA. He has PCP, he drives himself to appointments. He has no difficulty affording his medications and he manages administering them himself. He was hypoxic on admission but is doing well off supplemental oxygen at this time. He has CPAP at home but states he does not use it anymore due to inconvenience of taking it on and off when he has to get up at night. CM encouraged pt to use his CPAP and discussed benefits of its use. Pt plans to return home with self care. Friend at bedside.   Action/Plan: No CM needs anticipated.   Expected Discharge Date:     03/23/2016             Expected Discharge Plan:  Home/Self Care  In-House Referral:  NA  Discharge planning Services  NA  Post Acute Care Choice:  NA Choice offered to:  NA  Status of Service:  Completed, signed off   Sherald Barge, RN 03/23/2016, 9:32 AM

## 2016-03-24 ENCOUNTER — Inpatient Hospital Stay (HOSPITAL_COMMUNITY): Payer: Medicare Other

## 2016-03-24 DIAGNOSIS — I509 Heart failure, unspecified: Secondary | ICD-10-CM

## 2016-03-24 LAB — CBC
HEMATOCRIT: 41.2 % (ref 39.0–52.0)
HEMOGLOBIN: 13.1 g/dL (ref 13.0–17.0)
MCH: 25 pg — ABNORMAL LOW (ref 26.0–34.0)
MCHC: 31.8 g/dL (ref 30.0–36.0)
MCV: 78.5 fL (ref 78.0–100.0)
PLATELETS: 118 10*3/uL — AB (ref 150–400)
RBC: 5.25 MIL/uL (ref 4.22–5.81)
RDW: 18.4 % — ABNORMAL HIGH (ref 11.5–15.5)
WBC: 11.1 10*3/uL — AB (ref 4.0–10.5)

## 2016-03-24 LAB — ECHOCARDIOGRAM COMPLETE
AO mean calculated velocity dopler: 92.5 cm/s
AOPV: 0.93 m/s
AOVTI: 29.1 cm
AV Area VTI index: 1.15 cm2/m2
AV Area VTI: 2.63 cm2
AV Peak grad: 9 mmHg
AV area mean vel ind: 1.09 cm2/m2
AV peak Index: 1.06
AV vel: 2.85
AVAREAMEANV: 2.72 cm2
AVCELMEANRAT: 0.96
AVG: 4 mmHg
AVLVOTPG: 8 mmHg
AVPKVEL: 150 cm/s
CHL CUP MV DEC (S): 342
CHL CUP RV SYS PRESS: 33 mmHg
CHL CUP STROKE VOLUME: 49 mL
E/e' ratio: 8.66
EWDT: 342 ms
FS: 22 % — AB (ref 28–44)
Height: 66 in
IVS/LV PW RATIO, ED: 1.15
LA ID, A-P, ES: 39 mm
LA vol index: 17.3 mL/m2
LA vol: 43 mL
LADIAMINDEX: 1.57 cm/m2
LAVOLA4C: 37.4 mL
LDCA: 2.84 cm2
LEFT ATRIUM END SYS DIAM: 39 mm
LV E/e'average: 8.66
LV PW d: 14.2 mm — AB (ref 0.6–1.1)
LV SIMPSON'S DISK: 63
LV TDI E'LATERAL: 8.59
LV TDI E'MEDIAL: 7.18
LV dias vol index: 31 mL/m2
LVDIAVOL: 78 mL (ref 62–150)
LVEEMED: 8.66
LVELAT: 8.59 cm/s
LVOT VTI: 29.2 cm
LVOT diameter: 19 mm
LVOT peak VTI: 1 cm
LVOTPV: 139 cm/s
LVOTSV: 83 mL
LVSYSVOL: 29 mL (ref 21–61)
LVSYSVOLIN: 12 mL/m2
MV Peak grad: 2 mmHg
MV pk A vel: 82.9 m/s
MVPKEVEL: 74.4 m/s
RV LATERAL S' VELOCITY: 9.14 cm/s
Reg peak vel: 273 cm/s
TAPSE: 15.7 mm
TRMAXVEL: 273 cm/s
Valve area index: 1.15
Valve area: 2.85 cm2
Weight: 4433.6 oz

## 2016-03-24 LAB — BASIC METABOLIC PANEL
ANION GAP: 6 (ref 5–15)
BUN: 22 mg/dL — ABNORMAL HIGH (ref 6–20)
CHLORIDE: 94 mmol/L — AB (ref 101–111)
CO2: 37 mmol/L — ABNORMAL HIGH (ref 22–32)
CREATININE: 1.13 mg/dL (ref 0.61–1.24)
Calcium: 9 mg/dL (ref 8.9–10.3)
GFR calc non Af Amer: >60 mL/min (ref >60)
Glucose, Bld: 105 mg/dL — ABNORMAL HIGH (ref 65–99)
POTASSIUM: 3.6 mmol/L (ref 3.5–5.1)
SODIUM: 137 mmol/L (ref 135–145)

## 2016-03-24 LAB — MAGNESIUM: Magnesium: 2.1 mg/dL (ref 1.7–2.4)

## 2016-03-24 MED ORDER — SENNOSIDES-DOCUSATE SODIUM 8.6-50 MG PO TABS
1.0000 | ORAL_TABLET | Freq: Two times a day (BID) | ORAL | Status: DC
  Administered 2016-03-24 – 2016-03-26 (×4): 1 via ORAL
  Filled 2016-03-24 (×4): qty 1

## 2016-03-24 MED ORDER — BISACODYL 10 MG RE SUPP
10.0000 mg | Freq: Every day | RECTAL | Status: DC
  Administered 2016-03-24 – 2016-03-25 (×2): 10 mg via RECTAL
  Filled 2016-03-24 (×3): qty 1

## 2016-03-24 MED ORDER — POTASSIUM CHLORIDE CRYS ER 20 MEQ PO TBCR
40.0000 meq | EXTENDED_RELEASE_TABLET | Freq: Once | ORAL | Status: AC
  Administered 2016-03-24: 40 meq via ORAL
  Filled 2016-03-24: qty 2

## 2016-03-24 MED ORDER — METOLAZONE 5 MG PO TABS
2.5000 mg | ORAL_TABLET | Freq: Once | ORAL | Status: AC
  Administered 2016-03-24: 2.5 mg via ORAL
  Filled 2016-03-24: qty 1

## 2016-03-24 NOTE — Progress Notes (Signed)
*  PRELIMINARY RESULTS* Echocardiogram 2D Echocardiogram has been performed.  Williamson, Whitted 03/24/2016, 11:35 AM

## 2016-03-24 NOTE — Progress Notes (Signed)
PROGRESS NOTE                                                                                                                                                                                                             Patient Demographics:    [name] [name], is a 75 y.o. male, DOB - 1940/07/29, [id number]  Admit date - 03/22/2016   Admitting Physician Verlee Monte, MD  Outpatient Primary MD for the patient is [name]., MD  LOS - 1  Chief Complaint  Patient presents with  . Abdominal Pain  . Back Pain       Brief Narrative   [name] [name] is a 75 y.o. male with medical history significant of diastolic CHF, chronic back pain, hypertension and hypothyroidism. Patient came into the hospital because of back pain and lower extremity edema. Patient has chronic pain and he is taking opioid pain medication for that at home, for the past several days he was having more pain so he came into the ED several days ago when he was discharged with more pain medicines. While he was having more back pain he started to take less diuretics to minimize his movement out of bed, he noticed that his legs started to swell more than usual and he started to get short of breath so he came to the hospital for both back pain and lower extremity swelling.     Subjective:    [name] [name] today has, No headache, No chest pain, No abdominal pain - No Nausea, No new weakness tingling or numbness, No Cough - Improved SOB.  Chr R.Lower back pain   Assessment  & Plan :    1.Acute hypoxic respiratory failure due to acute on chronic diastolic CHF, last EF [id number] in 2014. Patient much improved with IV Lasix which will be continued, add TEDs, repeat 2-D echocardiogram pending, placed on salt and fluid restriction, continue beta blocker along with daily weights and intake and output monitoring. So far 1.5 L negative ( patient flushing urine -  educated)  Filed Weights   03/22/16 1410 03/23/16 0453 03/24/16 0548  Weight: 128.2 kg (282 lb 10.1 oz) 127.7 kg (281 lb 8 oz) 125.7 kg (277 lb 1.6 oz)     2. Bilateral lower extremity edema due to #1 above. Treatment as in #1 above.  3. Chronic right-sided  lower back pain. Status post L-spine surgery in the past, patient takes anesthetic shots every 2 weeks for the last several years, he was due for a short today, in control now, outpatient follow-up with this pain physician post discharge. No neurological deficits. No tenderness on palpation.  4. Chronic leukocytosis. Follow with PCP.  5. Morbid obesity. Follow with PCP for weight loss.  6. Questionable liver nodularity. Patient unable to fit in our local MRI, outpatient MRI at [facility] by PCP.    Family Communication  :  wife  Code Status :  Full  Diet :  Herat Healthy  Disposition Plan  :  Home 2-3 days  Consults  :  None  Procedures  :    TTE  DVT Prophylaxis  :    Heparin    Lab Results  Component Value Date   PLT 118 (L) 03/24/2016    Inpatient Medications  Scheduled Meds: . bisacodyl  10 mg Rectal Daily  . calcium carbonate  500 mg of elemental calcium Oral Daily  . cholecalciferol  1,000 Units Oral Daily  . dorzolamide-timolol  1 drop Both Eyes BID  . ferrous sulfate  325 mg Oral Q breakfast  . furosemide  80 mg Intravenous BID  . heparin  5,000 Units Subcutaneous Q8H  . latanoprost  1 drop Both Eyes QHS  . magnesium oxide  400 mg Oral Daily  . metoprolol tartrate  25 mg Oral Daily  . pantoprazole  40 mg Oral BID  . senna-docusate  1 tablet Oral BID  . sodium chloride flush  3 mL Intravenous Q12H  . tiZANidine  4 mg Oral TID   Continuous Infusions: . sodium chloride     PRN Meds:.acetaminophen **OR** [DISCONTINUED] acetaminophen, docusate sodium, HYDROcodone-acetaminophen, ondansetron **OR** ondansetron (ZOFRAN) IV, polyvinyl alcohol  Antibiotics  :    Anti-infectives    None          Objective:   Vitals:   03/23/16 1500 03/23/16 2007 03/23/16 2109 03/24/16 0548  BP: 117/65  (!) 110/52 132/64  Pulse: 69  65 73  Resp: 20  20 20   Temp: 97.8 F (36.6 C)  98.1 F (36.7 C) 98.6 F (37 C)  TempSrc: Oral  Oral Oral  SpO2: 91% 92% 92% 90%  Weight:    125.7 kg (277 lb 1.6 oz)  Height:        Wt Readings from Last 3 Encounters:  03/24/16 125.7 kg (277 lb 1.6 oz)  03/20/16 122.5 kg (270 lb)  02/10/16 117.9 kg (260 lb)     Intake/Output Summary (Last 24 hours) at 03/24/16 0838 Last data filed at 03/23/16 1700  Gross per 24 hour  Intake              720 ml  Output             1200 ml  Net             -480 ml     Physical Exam  Awake Alert, Oriented X 3, No new F.N deficits, Normal affect [place].AT,PERRAL Supple Neck,No JVD, No cervical lymphadenopathy appriciated.  Symmetrical Chest wall movement, Good air movement bilaterally, +ve rales RRR,No Gallops,Rubs or new Murmurs, No Parasternal Heave +ve B.Sounds, Abd Soft, No tenderness, No organomegaly appriciated, No rebound - guarding or rigidity. No Cyanosis, Clubbing , 2+ leg edema, No new Rash or bruise      Data Review:    CBC  Recent Labs Lab 03/22/16 0832 03/23/16 0554 03/24/16 [id number]  WBC 17.6* 13.8* 11.1*  HGB 13.9 13.3 13.1  HCT 42.4 42.0 41.2  PLT 143* 126* 118*  MCV 77.9* 78.8 78.5  MCH 25.6* 25.0* 25.0*  MCHC 32.8 31.7 31.8  RDW 18.2* 18.4* 18.4*  LYMPHSABS 1.7  --   --   MONOABS 0.6  --   --   EOSABS 0.0  --   --   BASOSABS 0.0  --   --     Chemistries   Recent Labs Lab 03/22/16 0832 03/23/16 0554 03/24/16 0620  NA 136 137 137  K 4.1 3.7 3.6  CL 101 97* 94*  CO2 30 34* 37*  GLUCOSE 123* 113* 105*  BUN 13 18 22*  CREATININE 0.90 1.05 1.13  CALCIUM 9.4 9.5 9.0  MG  --   --  2.1  AST 18  --   --   ALT 16*  --   --   ALKPHOS 63  --   --   BILITOT 1.0  --   --     ------------------------------------------------------------------------------------------------------------------ No results for input(s): CHOL, HDL, LDLCALC, TRIG, CHOLHDL, LDLDIRECT in the last 72 hours.  Lab Results  Component Value Date   HGBA1C 5.5 03/22/2016   ------------------------------------------------------------------------------------------------------------------  Recent Labs  03/22/16 0832  TSH 1.074   ------------------------------------------------------------------------------------------------------------------ No results for input(s): VITAMINB12, FOLATE, FERRITIN, TIBC, IRON, RETICCTPCT in the last 72 hours.  Coagulation profile  Recent Labs Lab 03/22/16 1509  INR 0.97    No results for input(s): DDIMER in the last 72 hours.  Cardiac Enzymes No results for input(s): CKMB, TROPONINI, MYOGLOBIN in the last 168 hours.  Invalid input(s): CK ------------------------------------------------------------------------------------------------------------------    Component Value Date/Time   BNP 24.0 03/22/2016 0832   BNP 4.1 08/30/2013 0940    Micro Results No results found for this or any previous visit (from the past 240 hour(s)).  Radiology Reports Dg Chest 2 View  Result Date: 03/22/2016 CLINICAL DATA:  abdominal distension. Dyspnea on exertion. Lower extremity edema. EXAM: CHEST  2 VIEW COMPARISON:  01/26/2016. FINDINGS: Lateral view degraded by patient arm position and artifact posteriorly. Midline trachea. Borderline cardiomegaly. Mediastinal contours otherwise within normal limits. Numerous leads and wires project over the chest. Frontal view is also mildly degraded by patient body habitus and portable technique. No pleural effusion or pneumothorax. Mild bibasilar atelectasis suspected. No congestive failure. IMPRESSION: Borderline cardiomegaly and mild bibasilar atelectasis. No acute findings. Electronically Signed   By: Abigail Miyamoto M.D.   On:  03/22/2016 09:42   Ct Head Wo Contrast  Result Date: 03/22/2016 CLINICAL DATA:  Severe diffuse headache. EXAM: CT HEAD WITHOUT CONTRAST TECHNIQUE: Contiguous axial images were obtained from the base of the skull through the vertex without intravenous contrast. COMPARISON:  03/02/2015 FINDINGS: Brain: There is no evidence of acute cortical infarct, intracranial hemorrhage, mass, midline shift, or extra-axial fluid collection. The ventricles and sulci are normal. Vascular: No hyperdense vessel or unexpected calcification. Skull: No fracture or focal osseous lesion. Sinuses/Orbits: Visualized paranasal sinuses and mastoid air cells are clear. Postoperative changes to the globes. Other: None. IMPRESSION: No evidence of acute intracranial abnormality. Electronically Signed   By: Logan Bores M.D.   On: 03/22/2016 10:00   Ct Angio Abd/pel W And/or Wo Contrast  Result Date: 03/22/2016 CLINICAL DATA:  Low back pain that radiates to the right leg. Bilateral leg swelling. EXAM: CT ANGIOGRAPHY ABDOMEN AND PELVIS WITH CONTRAST AND WITHOUT CONTRAST TECHNIQUE: Multidetector CT imaging of the abdomen and pelvis was performed using the standard protocol  during bolus administration of intravenous contrast. Multiplanar reconstructed images and MIPs were obtained and reviewed to evaluate the vascular anatomy. CONTRAST:  100 mL Isovue 370 COMPARISON:  CT 06/09/2013 and abdominal CT 07/24/2010 FINDINGS: VASCULAR Aorta: Normal caliber of the abdominal aorta without dissection. Minimal atherosclerotic plaque in the distal abdominal aorta. No significant aortic stenosis. Celiac: Patent without evidence of aneurysm, dissection, vasculitis or significant stenosis. SMA: Patent without evidence of aneurysm, dissection, vasculitis or significant stenosis. Renals: Both renal arteries are patent without evidence of aneurysm, dissection, vasculitis, fibromuscular dysplasia or significant stenosis. IMA: Patent without evidence of  aneurysm, dissection, vasculitis or significant stenosis. Inflow: Minimal calcified plaque in the iliac arteries. No significant stenosis. No evidence for aneurysm. Proximal Outflow: Bilateral common femoral and visualized portions of the superficial and profunda femoral arteries are Patent without evidence of aneurysm, dissection, vasculitis or significant stenosis. Veins: No obvious venous abnormality within the limitations of this arterial phase study. Review of the MIP images confirms the above findings. NON-VASCULAR Lower chest: Motion artifact at the lung bases. There is volume loss in the lower lobes. No large pleural effusions. Hepatobiliary: There are multiple areas of hyperdensity and presumed contrast enhancement throughout the liver. Some of these have a nodular appearance such as in the left hepatic lobe measuring 1.7 cm on sequence 6, image 40. Some of these areas may represent transient hepatic attenuation differences. Gallbladder has been removed. Portal venous system appears to be patent but limited evaluation on this arterial phase of contrast. Pancreas: Normal appearance of the pancreas without inflammation or duct dilatation. Spleen: Normal appearance of spleen without enlargement. Adrenals/Urinary Tract: Normal adrenal glands. Urinary bladder is unremarkable. Evidence for bilateral renal cysts. No hydronephrosis. Stomach/Bowel: Normal appearance of the stomach and duodenum. There is laxity along the anterior abdominal wall. There is a large amount of bowel in the anterior upper abdomen. The cecum is located in the mid right abdomen. Normal appearance of the appendix. There is no evidence for bowel dilatation or obstruction. Lymphatic: No significant lymph node enlargement in the abdomen or pelvis. Reproductive: Prostate is unremarkable. Other: No free fluid. There is tiny umbilical hernia containing fat. Low attenuating structure in the right inguinal canal could be related to the right testicle  but cannot exclude fluid in this area. Findings are similar to the prior CT. Musculoskeletal: Pedicle screw and rod fixation at L4-L5 with interbody device. IMPRESSION: VASCULAR Minimal atherosclerotic disease in the abdomen and pelvis. No evidence for dissection or aneurysm. NON-VASCULAR No acute abnormality in the abdomen or pelvis. Multiple hyperattenuating areas or structures throughout the liver. This could be related to transient hepatic attenuation differences during the arterial phase of imaging. However, some of these areas are nodular and cannot exclude small enhancing nodules. Consider a dedicated liver MRI to exclude liver lesions. Bilateral renal cysts. Electronically Signed   By: Markus Daft M.D.   On: 03/22/2016 10:22    Time Spent in minutes  30   Contrina Orona K M.D on 03/24/2016 at 8:38 AM  Between 7am to 7pm - Pager - [phone]  After 7pm go to [email or web address] - password [email or web address]  [facility] -  Office  [phone]

## 2016-03-25 LAB — POTASSIUM: POTASSIUM: 2.9 mmol/L — AB (ref 3.5–5.1)

## 2016-03-25 MED ORDER — FUROSEMIDE 10 MG/ML IJ SOLN
40.0000 mg | Freq: Two times a day (BID) | INTRAMUSCULAR | Status: DC
  Administered 2016-03-25 (×2): 40 mg via INTRAVENOUS
  Filled 2016-03-25 (×2): qty 4

## 2016-03-25 MED ORDER — POTASSIUM CHLORIDE 10 MEQ/100ML IV SOLN
10.0000 meq | INTRAVENOUS | Status: AC
Start: 2016-03-25 — End: 2016-03-25
  Administered 2016-03-25 (×4): 10 meq via INTRAVENOUS
  Filled 2016-03-25 (×4): qty 100

## 2016-03-25 MED ORDER — POTASSIUM CHLORIDE CRYS ER 20 MEQ PO TBCR
40.0000 meq | EXTENDED_RELEASE_TABLET | Freq: Once | ORAL | Status: AC
  Administered 2016-03-25: 40 meq via ORAL
  Filled 2016-03-25: qty 2

## 2016-03-25 MED ORDER — MAGNESIUM HYDROXIDE 400 MG/5ML PO SUSP
960.0000 mL | Freq: Once | ORAL | Status: AC
Start: 2016-03-25 — End: 2016-03-25
  Administered 2016-03-25: 960 mL via RECTAL
  Filled 2016-03-25: qty 240

## 2016-03-25 MED ORDER — FUROSEMIDE 10 MG/ML IJ SOLN: 60.0000 mg | Freq: Two times a day (BID) | INTRAMUSCULAR | Status: DC

## 2016-03-25 MED ORDER — AMLODIPINE BESYLATE 5 MG PO TABS
10.0000 mg | ORAL_TABLET | Freq: Every day | ORAL | Status: DC
  Administered 2016-03-25 – 2016-03-26 (×2): 10 mg via ORAL
  Filled 2016-03-25 (×2): qty 2

## 2016-03-25 NOTE — Care Management Important Message (Signed)
Important Message  Patient Details  Name: Hadi Francy MRN: [id number] Date of Birth: [birth year]   Medicare Important Message Given:  Yes    Sherald Barge, RN 03/25/2016, 10:40 AM

## 2016-03-25 NOTE — Progress Notes (Signed)
PROGRESS NOTE                                                                                                                                                                                                             Patient Demographics:    [name] [name], is a 75 y.o. male, DOB - January 18, 1941, [id number]  Admit date - 03/22/2016   Admitting Physician Verlee Monte, MD  Outpatient Primary MD for the patient is [name]., MD  LOS - 2  Chief Complaint  Patient presents with  . Abdominal Pain  . Back Pain       Brief Narrative   [name] [name] is a 75 y.o. male with medical history significant of diastolic CHF, chronic back pain, hypertension and hypothyroidism. Patient came into the hospital because of back pain and lower extremity edema. Patient has chronic pain and he is taking opioid pain medication for that at home, for the past several days he was having more pain so he came into the ED several days ago when he was discharged with more pain medicines. While he was having more back pain he started to take less diuretics to minimize his movement out of bed, he noticed that his legs started to swell more than usual and he started to get short of breath so he came to the hospital for both back pain and lower extremity swelling.     Subjective:    [name] [name] today has, No headache, No chest pain, No abdominal pain - No Nausea, No new weakness tingling or numbness, No Cough - Improved SOB.  Chr R.Lower back pain   Assessment  & Plan :    1.Acute hypoxic respiratory failure due to acute on chronic diastolic CHF, last EF [id number]. Patient much improved with IV Lasix which will be continued, added TEDs, noted TTE, placed on salt and fluid restriction, continue beta blocker along with daily weights and intake and output monitoring. So far 3.5 L negative ( patient flushing urine - educated)  Filed Weights   03/23/16 0453 03/24/16 0548 03/25/16 0500  Weight: 127.7 kg (281 lb 8 oz) 125.7 kg (277 lb 1.6 oz) 124.4 kg (274 lb 4 oz)     2. Bilateral lower extremity edema due to #1 above. Treatment as in #1 above.  3. Chronic right-sided lower back pain. Status post  L-spine surgery in the past, patient takes anesthetic shots every 2 weeks for the last several years, he was due for a short today, in control now, outpatient follow-up with this pain physician post discharge. No neurological deficits. No tenderness on palpation.  4. Chronic leukocytosis. Follow with PCP.  5. Morbid obesity. Follow with PCP for weight loss.  6. Questionable liver nodularity. Patient unable to fit in our local MRI, outpatient [facility] at [facility] by PCP.  7.Hypertension. Add Norvasc to beta blocker.  8. Constipation. Initiated on bowel regimen, still no BM, SMOG enema 1.  9. Hypokalemia. Replace and monitor.    Family Communication  :  wife  Code Status :  Full  Diet :  Herat Healthy  Disposition Plan  :  Home 2-3 days  Consults  :  None  Procedures  :    TTE -    - Left ventricle: The cavity size was normal. Wall thickness was  increased in a pattern of moderate LVH. Systolic function was normal. The estimated ejection fraction was in the range of 60%  to 65%. Doppler parameters are consistent with abnormal left  ventricular relaxation (grade 1 diastolic dysfunction). - Aortic valve: Valve area (VTI): 2.85 cm^2. Valve area (Vmax): 2.63 cm^2. Valve area (Vmean): 2.72 cm^2. - Pulmonary arteries: Systolic pressure was mildly increased. PA  peak pressure: 33 mm Hg (S). - Technically adequate study.  DVT Prophylaxis  :    Heparin    Lab Results  Component Value Date   PLT 118 (L) 03/24/2016    Inpatient Medications  Scheduled Meds: . bisacodyl  10 mg Rectal Daily  . calcium carbonate  500 mg of elemental calcium Oral Daily  . cholecalciferol  1,000 Units Oral Daily  . dorzolamide-timolol  1 drop Both Eyes  BID  . ferrous sulfate  325 mg Oral Q breakfast  . furosemide  60 mg Intravenous BID  . heparin  5,000 Units Subcutaneous Q8H  . latanoprost  1 drop Both Eyes QHS  . magnesium oxide  400 mg Oral Daily  . metoprolol tartrate  25 mg Oral Daily  . pantoprazole  40 mg Oral BID  . potassium chloride  10 mEq Intravenous Q1 Hr x 4  . potassium chloride  40 mEq Oral Once  . potassium chloride  40 mEq Oral Once  . senna-docusate  1 tablet Oral BID  . sodium chloride flush  3 mL Intravenous Q12H  . sorbitol, milk of mag, mineral oil, glycerin (SMOG) enema  960 mL Rectal Once  . tiZANidine  4 mg Oral TID   Continuous Infusions: . sodium chloride     PRN Meds:.acetaminophen **OR** [DISCONTINUED] acetaminophen, docusate sodium, HYDROcodone-acetaminophen, ondansetron **OR** ondansetron (ZOFRAN) IV, polyvinyl alcohol  Antibiotics  :    Anti-infectives    None         Objective:   Vitals:   03/24/16 0548 03/24/16 1317 03/24/16 2200 03/25/16 0500  BP: 132/64 117/67 (!) 103/57 (!) 161/88  Pulse: 73 66 70 80  Resp: 20 20 20 20   Temp: 98.6 F (37 C) 98.5 F (36.9 C) 98 F (36.7 C) 98.3 F (36.8 C)  TempSrc: Oral Oral Oral Oral  SpO2: 90% (!) 86% 96% 98%  Weight: 125.7 kg (277 lb 1.6 oz)   124.4 kg (274 lb 4 oz)  Height:        Wt Readings from Last 3 Encounters:  03/25/16 124.4 kg (274 lb 4 oz)  03/20/16 122.5 kg (270 lb)  02/10/16  117.9 kg (260 lb)     Intake/Output Summary (Last 24 hours) at 03/25/16 0828 Last data filed at 03/24/16 2200  Gross per 24 hour  Intake              680 ml  Output             2700 ml  Net            -2020 ml     Physical Exam  Awake Alert, Oriented X 3, No new F.N deficits, Normal affect [place].AT,PERRAL Supple Neck,No JVD, No cervical lymphadenopathy appriciated.  Symmetrical Chest wall movement, Good air movement bilaterally, few rales RRR,No Gallops,Rubs or new Murmurs, No Parasternal Heave +ve B.Sounds, Abd Soft, No tenderness, No  organomegaly appriciated, No rebound - guarding or rigidity. No Cyanosis, Clubbing , 1+ leg edema, No new Rash or bruise      Data Review:    CBC  Recent Labs Lab 03/22/16 0832 03/23/16 0554 03/24/16 0620  WBC 17.6* 13.8* 11.1*  HGB 13.9 13.3 13.1  HCT 42.4 42.0 41.2  PLT 143* 126* 118*  MCV 77.9* 78.8 78.5  MCH 25.6* 25.0* 25.0*  MCHC 32.8 31.7 31.8  RDW 18.2* 18.4* 18.4*  LYMPHSABS 1.7  --   --   MONOABS 0.6  --   --   EOSABS 0.0  --   --   BASOSABS 0.0  --   --     Chemistries   Recent Labs Lab 03/22/16 0832 03/23/16 0554 03/24/16 0620 03/25/16 0619  NA 136 137 137  --   K 4.1 3.7 3.6 2.9*  CL 101 97* 94*  --   CO2 30 34* 37*  --   GLUCOSE 123* 113* 105*  --   BUN 13 18 22*  --   CREATININE 0.90 1.05 1.13  --   CALCIUM 9.4 9.5 9.0  --   MG  --   --  2.1  --   AST 18  --   --   --   ALT 16*  --   --   --   ALKPHOS 63  --   --   --   BILITOT 1.0  --   --   --    ------------------------------------------------------------------------------------------------------------------ No results for input(s): CHOL, HDL, LDLCALC, TRIG, CHOLHDL, LDLDIRECT in the last 72 hours.  Lab Results  Component Value Date   HGBA1C 5.5 03/22/2016   ------------------------------------------------------------------------------------------------------------------  Recent Labs  03/22/16 0832  TSH 1.074   ------------------------------------------------------------------------------------------------------------------ No results for input(s): VITAMINB12, FOLATE, FERRITIN, TIBC, IRON, RETICCTPCT in the last 72 hours.  Coagulation profile  Recent Labs Lab 03/22/16 1509  INR 0.97    No results for input(s): DDIMER in the last 72 hours.  Cardiac Enzymes No results for input(s): CKMB, TROPONINI, MYOGLOBIN in the last 168 hours.  Invalid input(s): CK ------------------------------------------------------------------------------------------------------------------      Component Value Date/Time   BNP 24.0 03/22/2016 0832   BNP 4.1 08/30/2013 0940    Micro Results No results found for this or any previous visit (from the past 240 hour(s)).  Radiology Reports Dg Chest 2 View  Result Date: 03/22/2016 CLINICAL DATA:  abdominal distension. Dyspnea on exertion. Lower extremity edema. EXAM: CHEST  2 VIEW COMPARISON:  01/26/2016. FINDINGS: Lateral view degraded by patient arm position and artifact posteriorly. Midline trachea. Borderline cardiomegaly. Mediastinal contours otherwise within normal limits. Numerous leads and wires project over the chest. Frontal view is also mildly degraded by patient body habitus and portable technique.  No pleural effusion or pneumothorax. Mild bibasilar atelectasis suspected. No congestive failure. IMPRESSION: Borderline cardiomegaly and mild bibasilar atelectasis. No acute findings. Electronically Signed   By: Abigail Miyamoto M.D.   On: 03/22/2016 09:42   Ct Head Wo Contrast  Result Date: 03/22/2016 CLINICAL DATA:  Severe diffuse headache. EXAM: CT HEAD WITHOUT CONTRAST TECHNIQUE: Contiguous axial images were obtained from the base of the skull through the vertex without intravenous contrast. COMPARISON:  03/02/2015 FINDINGS: Brain: There is no evidence of acute cortical infarct, intracranial hemorrhage, mass, midline shift, or extra-axial fluid collection. The ventricles and sulci are normal. Vascular: No hyperdense vessel or unexpected calcification. Skull: No fracture or focal osseous lesion. Sinuses/Orbits: Visualized paranasal sinuses and mastoid air cells are clear. Postoperative changes to the globes. Other: None. IMPRESSION: No evidence of acute intracranial abnormality. Electronically Signed   By: Logan Bores M.D.   On: 03/22/2016 10:00   Ct Angio Abd/pel W And/or Wo Contrast  Result Date: 03/22/2016 CLINICAL DATA:  Low back pain that radiates to the right leg. Bilateral leg swelling. EXAM: CT ANGIOGRAPHY ABDOMEN AND PELVIS  WITH CONTRAST AND WITHOUT CONTRAST TECHNIQUE: Multidetector CT imaging of the abdomen and pelvis was performed using the standard protocol during bolus administration of intravenous contrast. Multiplanar reconstructed images and MIPs were obtained and reviewed to evaluate the vascular anatomy. CONTRAST:  100 mL Isovue 370 COMPARISON:  CT 06/09/2013 and abdominal CT 07/24/2010 FINDINGS: VASCULAR Aorta: Normal caliber of the abdominal aorta without dissection. Minimal atherosclerotic plaque in the distal abdominal aorta. No significant aortic stenosis. Celiac: Patent without evidence of aneurysm, dissection, vasculitis or significant stenosis. SMA: Patent without evidence of aneurysm, dissection, vasculitis or significant stenosis. Renals: Both renal arteries are patent without evidence of aneurysm, dissection, vasculitis, fibromuscular dysplasia or significant stenosis. IMA: Patent without evidence of aneurysm, dissection, vasculitis or significant stenosis. Inflow: Minimal calcified plaque in the iliac arteries. No significant stenosis. No evidence for aneurysm. Proximal Outflow: Bilateral common femoral and visualized portions of the superficial and profunda femoral arteries are Patent without evidence of aneurysm, dissection, vasculitis or significant stenosis. Veins: No obvious venous abnormality within the limitations of this arterial phase study. Review of the MIP images confirms the above findings. NON-VASCULAR Lower chest: Motion artifact at the lung bases. There is volume loss in the lower lobes. No large pleural effusions. Hepatobiliary: There are multiple areas of hyperdensity and presumed contrast enhancement throughout the liver. Some of these have a nodular appearance such as in the left hepatic lobe measuring 1.7 cm on sequence 6, image 40. Some of these areas may represent transient hepatic attenuation differences. Gallbladder has been removed. Portal venous system appears to be patent but limited  evaluation on this arterial phase of contrast. Pancreas: Normal appearance of the pancreas without inflammation or duct dilatation. Spleen: Normal appearance of spleen without enlargement. Adrenals/Urinary Tract: Normal adrenal glands. Urinary bladder is unremarkable. Evidence for bilateral renal cysts. No hydronephrosis. Stomach/Bowel: Normal appearance of the stomach and duodenum. There is laxity along the anterior abdominal wall. There is a large amount of bowel in the anterior upper abdomen. The cecum is located in the mid right abdomen. Normal appearance of the appendix. There is no evidence for bowel dilatation or obstruction. Lymphatic: No significant lymph node enlargement in the abdomen or pelvis. Reproductive: Prostate is unremarkable. Other: No free fluid. There is tiny umbilical hernia containing fat. Low attenuating structure in the right inguinal canal could be related to the right testicle but cannot exclude fluid in this  area. Findings are similar to the prior CT. Musculoskeletal: Pedicle screw and rod fixation at L4-L5 with interbody device. IMPRESSION: VASCULAR Minimal atherosclerotic disease in the abdomen and pelvis. No evidence for dissection or aneurysm. NON-VASCULAR No acute abnormality in the abdomen or pelvis. Multiple hyperattenuating areas or structures throughout the liver. This could be related to transient hepatic attenuation differences during the arterial phase of imaging. However, some of these areas are nodular and cannot exclude small enhancing nodules. Consider a dedicated liver MRI to exclude liver lesions. Bilateral renal cysts. Electronically Signed   By: Markus Daft M.D.   On: 03/22/2016 10:22    Time Spent in minutes  30   Abrar Bilton K M.D on 03/25/2016 at 8:28 AM  Between 7am to 7pm - Pager - [phone]  After 7pm go to [email or web address] - password [facility]  [facility] -  Office  [phone]

## 2016-03-26 LAB — BASIC METABOLIC PANEL
Anion gap: 10 (ref 5–15)
BUN: 20 mg/dL (ref 6–20)
CALCIUM: 9.2 mg/dL (ref 8.9–10.3)
CO2: 36 mmol/L — ABNORMAL HIGH (ref 22–32)
CREATININE: 1.35 mg/dL — AB (ref 0.61–1.24)
Chloride: 89 mmol/L — ABNORMAL LOW (ref 101–111)
GFR calc Af Amer: 57 mL/min — ABNORMAL LOW (ref >60)
GFR, EST NON AFRICAN AMERICAN: 49 mL/min — AB (ref >60)
Glucose, Bld: 149 mg/dL — ABNORMAL HIGH (ref 65–99)
Potassium: 3.4 mmol/L — ABNORMAL LOW (ref 3.5–5.1)
SODIUM: 135 mmol/L (ref 135–145)

## 2016-03-26 LAB — MAGNESIUM: MAGNESIUM: 2 mg/dL (ref 1.7–2.4)

## 2016-03-26 MED ORDER — METOLAZONE 2.5 MG PO TABS: 2.5000 mg | ORAL_TABLET | ORAL | 0 refills | Status: DC

## 2016-03-26 MED ORDER — POTASSIUM CHLORIDE CRYS ER 20 MEQ PO TBCR
40.0000 meq | EXTENDED_RELEASE_TABLET | Freq: Once | ORAL | Status: AC
  Administered 2016-03-26: 40 meq via ORAL
  Filled 2016-03-26: qty 2

## 2016-03-26 MED ORDER — ORAL CARE MOUTH RINSE: 15.0000 mL | Freq: Two times a day (BID) | OROMUCOSAL | Status: DC

## 2016-03-26 MED ORDER — BISACODYL 5 MG PO TBEC: 5.0000 mg | DELAYED_RELEASE_TABLET | Freq: Every day | ORAL | 0 refills | Status: DC

## 2016-03-26 MED ORDER — POTASSIUM CHLORIDE CRYS ER 20 MEQ PO TBCR: 20.0000 meq | EXTENDED_RELEASE_TABLET | Freq: Two times a day (BID) | ORAL | 0 refills | Status: DC

## 2016-03-26 NOTE — Progress Notes (Signed)
D/C Instructions given to pt. Verbalized understanding. IV removed, WNL. Pt awaiting ride to home.

## 2016-03-26 NOTE — Discharge Summary (Signed)
[name] [name] [id number] DOB: 01-24-1941 DOA: 03/22/2016  PCP: [name]., MD  Admit date: 03/22/2016  Discharge date: 03/26/2016  Admitted From: Home  Disposition:  Home   Recommendations for Outpatient Follow-up:   Follow up with PCP in 1-2 weeks  PCP Please obtain BMP/CBC, 2 view CXR in 1week,  (see Discharge instructions)   PCP Please follow up on the following pending results: Review CT scan abdomen and pelvis report, check CBC, CMP, magnesium in 3 days, adjust diuretic and potassium dose as needed   Home Health: None  Equipment/Devices: None  Consultations: None Discharge Condition: Stable   CODE STATUS: Full   Diet Recommendation: Diet HeartHealthy Fluid restriction: 1200 mL Fluid/ day     Chief Complaint  Patient presents with  . Abdominal Pain  . Back Pain     Brief history of present illness from the day of admission and additional interim summary    [name] Withersis a 75 y.o.malewith medical history significant ofdiastolic CHF, chronic back pain, hypertension and hypothyroidism. Patient came into the hospital because of back pain and lower extremity edema. Patient has chronic pain and he is taking opioid pain medication for that at home, for the past several days he was having more pain so he came into the ED several days ago when he was discharged with more pain medicines. While he was having more back pain he started to take less diuretics to minimize his movement out of bed, he noticed that his legs started to swell more than usual and he started to get short of breath so he came to the hospital for both back pain and lower extremity swelling.  Hospital issues addressed     1.Acute hypoxic respiratory failure due to acute on chronic diastolic CHF, last EF [id number]. Patient much  improved with IV Lasix which will be continued, added TEDs, noted TTE, Is now close to baseline after diuresis, continue on salt and fluid restriction, continue beta blocker along with daily weights and intake. So far close to 7L negative ( I&O not correct, patient flushing urine - educated).   He is now symptom-free will be discharged home with outpatient PCP follow-up, request PCP to check CBC, CMP magnesium, weight and adjust diuretic and potassium dose in 3-4 days. Also follow with primary cardiologist Dr. branch within a week.  2. Bilateral lower extremity edema due to #1 above. Treatment as in #1 above.  3. Chronic right-sided lower back pain. Status post L-spine surgery in the past, patient takes anesthetic shots every 2 weeks for the last several years, he was due for a short today, in control now, outpatient follow-up with this pain physician post discharge. No neurological deficits. No tenderness on palpation.  4. Chronic leukocytosis. Follow with PCP and primary hematologist , he is already being monitored.  5. Morbid obesity. Follow with PCP for weight loss.  6. Questionable liver nodularity. Patient unable to fit in our local MRI, outpatient MRI at [facility] by PCP.  7.Hypertension. Table on Norvasc and beta blocker.  8.  Constipation. Resolved after bowel regimen and SMOG enema.  9. Hypokalemia. Replace and stable.  10. Mild AKI - diuretic dose reduced, repeat BMP in 3 days by PCP, excellent urine output.  Discharge diagnosis     Principal Problem:   Acute on chronic diastolic CHF Active Problems:   Hypothyroidism   Obesity   Edema of both legs   Acute right-sided low back pain with right-sided sciatica   Back pain   Acute on chronic diastolic congestive heart failure [facility])    Discharge instructions    Discharge Instructions    Discharge instructions    Complete by:  As directed    Follow with Primary MD [name]., MD in 3-4 days   Get CBC, CMP, 2  view Chest X ray checked  by Primary MD  in 3 days ( we routinely change or add medications that can affect your baseline labs and fluid status, therefore we recommend that you get the mentioned basic workup next visit with your PCP, your PCP may decide not to get them or add new tests based on their clinical decision)   Activity: As tolerated with Full fall precautions use walker/cane & assistance as needed   Disposition Home     Diet:   Heart Healthy - Fluid restriction: 1200 mL Fluid / day  For Heart failure patients - Check your Weight same time everyday, if you gain over 2 pounds, or you develop in leg swelling, experience more shortness of breath or chest pain, call your Primary MD immediately. Follow Cardiac Low Salt Diet and 1.5 lit/day fluid restriction.   On your next visit with your primary care physician please Get Medicines reviewed and adjusted.   Please request your Prim.MD to go over all Hospital Tests and Procedure/Radiological results at the follow up, please get all Hospital records sent to your Prim MD by signing hospital release before you go home.   If you experience worsening of your admission symptoms, develop shortness of breath, life threatening emergency, suicidal or homicidal thoughts you must seek medical attention immediately by calling 911 or calling your MD immediately  if symptoms less severe.  You Must read complete instructions/literature along with all the possible adverse reactions/side effects for all the Medicines you take and that have been prescribed to you. Take any new Medicines after you have completely understood and accpet all the possible adverse reactions/side effects.   Do not drive, operate heavy machinery, perform activities at heights, swimming or participation in water activities or provide baby sitting services if your were admitted for syncope or siezures until you have seen by Primary MD or a Neurologist and advised to do so again.  Do  not drive when taking Pain medications.    Do not take more than prescribed Pain, Sleep and Anxiety Medications  Special Instructions: If you have smoked or chewed Tobacco  in the last 2 yrs please stop smoking, stop any regular Alcohol  and or any Recreational drug use.  Wear Seat belts while driving.   Please note  You were cared for by a hospitalist during your hospital stay. If you have any questions about your discharge medications or the care you received while you were in the hospital after you are discharged, you can call the unit and asked to speak with the hospitalist on call if the hospitalist that took care of you is not available. Once you are discharged, your primary care physician will handle any further medical issues. Please note that NO  REFILLS for any discharge medications will be authorized once you are discharged, as it is imperative that you return to your primary care physician (or establish a relationship with a primary care physician if you do not have one) for your aftercare needs so that they can reassess your need for medications and monitor your lab values.   Increase activity slowly    Complete by:  As directed       Discharge Medications     Medication List    STOP taking these medications   HM POTASSIUM 595 (99 K) MG Tabs tablet Generic drug:  potassium gluconate   meloxicam 7.5 MG tablet Commonly known as:  MOBIC     TAKE these medications   acetaminophen 325 MG tablet Commonly known as:  TYLENOL Take 650 mg by mouth every 6 (six) hours as needed.   amLODipine 5 MG tablet Commonly known as:  NORVASC Take 5 mg by mouth daily.   bisacodyl 5 MG EC tablet Commonly known as:  DULCOLAX Take 1 tablet (5 mg total) by mouth daily.   calcium citrate 950 MG tablet Commonly known as:  CALCITRATE - dosed in mg elemental calcium Take 200 mg of elemental calcium by mouth daily.   docusate sodium 100 MG capsule Commonly known as:  COLACE Take 200 mg  by mouth daily as needed for mild constipation. Reported on 09/11/2015   dorzolamide-timolol 22.3-6.8 MG/ML ophthalmic solution Commonly known as:  COSOPT Place 1 drop into both eyes 2 (two) times daily.   ferrous sulfate 325 (65 FE) MG tablet Take 325 mg by mouth daily with breakfast.   GINGER ROOT PO Take 1 tablet by mouth 3 (three) times daily.   latanoprost 0.005 % ophthalmic solution Commonly known as:  XALATAN Place 1 drop into both eyes at bedtime.   LUBRICANT EYE DROPS 0.4-0.3 % Soln Generic drug:  Polyethyl Glycol-Propyl Glycol Apply 1 drop to eye daily as needed (for lubricant eye drops).   magnesium oxide 400 MG tablet Commonly known as:  MAG-OX Take 400 mg by mouth daily.   metolazone 2.5 MG tablet Commonly known as:  ZAROXOLYN Take 1 tablet (2.5 mg total) by mouth every Wednesday. Start taking on:  03/30/2016   metoprolol tartrate 25 MG tablet Commonly known as:  LOPRESSOR Take 25 mg by mouth daily.   Oxycodone HCl 10 MG Tabs Take 10 mg by mouth 4 (four) times daily as needed (pain).   pantoprazole 40 MG tablet Commonly known as:  PROTONIX Take 40 mg by mouth 2 (two) times daily.   potassium chloride SA 20 MEQ tablet Commonly known as:  K-DUR,KLOR-CON Take 1 tablet (20 mEq total) by mouth 2 (two) times daily.   tiZANidine 4 MG tablet Commonly known as:  ZANAFLEX Take 4 mg by mouth 3 (three) times daily.   torsemide 20 MG tablet Commonly known as:  DEMADEX Take 2 tablets (40 mg total) by mouth 2 (two) times daily. Resume on 10/19 What changed:  when to take this  reasons to take this  additional instructions   Vitamin D3 1000 units Caps Take 1 capsule by mouth daily.       Follow-up Information    [name]., MD. Schedule an appointment as soon as possible for a visit in 1 week(s).   Specialty:  Internal Medicine Contact information: [address] [place] [id number] [phone]        Carlyle Dolly, MD. Schedule  an appointment as soon as possible for a visit  in 1 week(s).   Specialty:  Cardiology Contact information: [address] [place] [address] [phone]        Robynn Pane, Hershal Coria. Schedule an appointment as soon as possible for a visit in 1 week(s).   Specialty:  Oncology Contact information: [place] [address] [phone]           Major procedures and Radiology Reports - PLEASE review detailed and final reports thoroughly  -      TTE -    - Left ventricle: The cavity size was normal. Wall thickness wasincreased in a pattern of moderate LVH. Systolic function wasnormal. The estimated ejection fraction was in the range of 60%to 65%. Doppler parameters are consistent with abnormal leftventricular relaxation (grade 1 diastolic dysfunction). - Aortic valve: Valve area (VTI): 2.85 cm^2. Valve area (Vmax):2.63 cm^2. Valve area (Vmean): 2.72 cm^2. - Pulmonary arteries: Systolic pressure was mildly increased. PApeak pressure: 33 mm Hg (S). - Technically adequate study.   Dg Chest 2 View  Result Date: 03/22/2016 CLINICAL DATA:  abdominal distension. Dyspnea on exertion. Lower extremity edema. EXAM: CHEST  2 VIEW COMPARISON:  01/26/2016. FINDINGS: Lateral view degraded by patient arm position and artifact posteriorly. Midline trachea. Borderline cardiomegaly. Mediastinal contours otherwise within normal limits. Numerous leads and wires project over the chest. Frontal view is also mildly degraded by patient body habitus and portable technique. No pleural effusion or pneumothorax. Mild bibasilar atelectasis suspected. No congestive failure. IMPRESSION: Borderline cardiomegaly and mild bibasilar atelectasis. No acute findings. Electronically Signed   By: Abigail Miyamoto M.D.   On: 03/22/2016 09:42   Ct Head Wo Contrast  Result Date: 03/22/2016 CLINICAL DATA:  Severe diffuse headache. EXAM: CT HEAD WITHOUT CONTRAST TECHNIQUE: Contiguous axial images were  obtained from the base of the skull through the vertex without intravenous contrast. COMPARISON:  03/02/2015 FINDINGS: Brain: There is no evidence of acute cortical infarct, intracranial hemorrhage, mass, midline shift, or extra-axial fluid collection. The ventricles and sulci are normal. Vascular: No hyperdense vessel or unexpected calcification. Skull: No fracture or focal osseous lesion. Sinuses/Orbits: Visualized paranasal sinuses and mastoid air cells are clear. Postoperative changes to the globes. Other: None. IMPRESSION: No evidence of acute intracranial abnormality. Electronically Signed   By: Logan Bores M.D.   On: 03/22/2016 10:00   Ct Angio Abd/pel W And/or Wo Contrast  Result Date: 03/22/2016 CLINICAL DATA:  Low back pain that radiates to the right leg. Bilateral leg swelling. EXAM: CT ANGIOGRAPHY ABDOMEN AND PELVIS WITH CONTRAST AND WITHOUT CONTRAST TECHNIQUE: Multidetector CT imaging of the abdomen and pelvis was performed using the standard protocol during bolus administration of intravenous contrast. Multiplanar reconstructed images and MIPs were obtained and reviewed to evaluate the vascular anatomy. CONTRAST:  100 mL Isovue 370 COMPARISON:  CT 06/09/2013 and abdominal CT 07/24/2010 FINDINGS: VASCULAR Aorta: Normal caliber of the abdominal aorta without dissection. Minimal atherosclerotic plaque in the distal abdominal aorta. No significant aortic stenosis. Celiac: Patent without evidence of aneurysm, dissection, vasculitis or significant stenosis. SMA: Patent without evidence of aneurysm, dissection, vasculitis or significant stenosis. Renals: Both renal arteries are patent without evidence of aneurysm, dissection, vasculitis, fibromuscular dysplasia or significant stenosis. IMA: Patent without evidence of aneurysm, dissection, vasculitis or significant stenosis. Inflow: Minimal calcified plaque in the iliac arteries. No significant stenosis. No evidence for aneurysm. Proximal Outflow:  Bilateral common femoral and visualized portions of the superficial and profunda femoral arteries are Patent without evidence of aneurysm, dissection, vasculitis or significant stenosis. Veins: No  obvious venous abnormality within the limitations of this arterial phase study. Review of the MIP images confirms the above findings. NON-VASCULAR Lower chest: Motion artifact at the lung bases. There is volume loss in the lower lobes. No large pleural effusions. Hepatobiliary: There are multiple areas of hyperdensity and presumed contrast enhancement throughout the liver. Some of these have a nodular appearance such as in the left hepatic lobe measuring 1.7 cm on sequence 6, image 40. Some of these areas may represent transient hepatic attenuation differences. Gallbladder has been removed. Portal venous system appears to be patent but limited evaluation on this arterial phase of contrast. Pancreas: Normal appearance of the pancreas without inflammation or duct dilatation. Spleen: Normal appearance of spleen without enlargement. Adrenals/Urinary Tract: Normal adrenal glands. Urinary bladder is unremarkable. Evidence for bilateral renal cysts. No hydronephrosis. Stomach/Bowel: Normal appearance of the stomach and duodenum. There is laxity along the anterior abdominal wall. There is a large amount of bowel in the anterior upper abdomen. The cecum is located in the mid right abdomen. Normal appearance of the appendix. There is no evidence for bowel dilatation or obstruction. Lymphatic: No significant lymph node enlargement in the abdomen or pelvis. Reproductive: Prostate is unremarkable. Other: No free fluid. There is tiny umbilical hernia containing fat. Low attenuating structure in the right inguinal canal could be related to the right testicle but cannot exclude fluid in this area. Findings are similar to the prior CT. Musculoskeletal: Pedicle screw and rod fixation at L4-L5 with interbody device. IMPRESSION: VASCULAR  Minimal atherosclerotic disease in the abdomen and pelvis. No evidence for dissection or aneurysm. NON-VASCULAR No acute abnormality in the abdomen or pelvis. Multiple hyperattenuating areas or structures throughout the liver. This could be related to transient hepatic attenuation differences during the arterial phase of imaging. However, some of these areas are nodular and cannot exclude small enhancing nodules. Consider a dedicated liver MRI to exclude liver lesions. Bilateral renal cysts. Electronically Signed   By: Markus Daft M.D.   On: 03/22/2016 10:22    Micro Results     No results found for this or any previous visit (from the past 240 hour(s)).  Today   Subjective    [name] [name] today has no headache,no chest abdominal pain,no new weakness tingling or numbness, feels much better wants to go home today.     Objective   Blood pressure 133/75, pulse 80, temperature 98.1 F (36.7 C), temperature source Oral, resp. rate 20, height 5\' 6"  (1.676 m), weight 121.8 kg (268 lb 8 oz), SpO2 94 %.   Intake/Output Summary (Last 24 hours) at 03/26/16 0829 Last data filed at 03/26/16 0700  Gross per 24 hour  Intake              640 ml  Output              700 ml  Net              -60 ml    Exam Awake Alert, Oriented x 3, No new F.N deficits, Normal affect [place].AT,PERRAL Supple Neck,No JVD, No cervical lymphadenopathy appriciated.  Symmetrical Chest wall movement, Good air movement bilaterally, CTAB RRR,No Gallops,Rubs or new Murmurs, No Parasternal Heave +ve B.Sounds, Abd Soft, Non tender, No organomegaly appriciated, No rebound -guarding or rigidity. No Cyanosis, Clubbing , trace edema, No new Rash or bruise   Data Review   CBC w Diff: Lab Results  Component Value Date   WBC 11.1 (H) 03/24/2016   HGB 13.1  03/24/2016   HCT 41.2 03/24/2016   PLT 118 (L) 03/24/2016   LYMPHOPCT 9 03/22/2016   MONOPCT 4 03/22/2016   EOSPCT 0 03/22/2016   BASOPCT 0 03/22/2016    CMP: Lab  Results  Component Value Date   NA 135 03/26/2016   K 3.4 (L) 03/26/2016   CL 89 (L) 03/26/2016   CO2 36 (H) 03/26/2016   BUN 20 03/26/2016   CREATININE 1.35 (H) 03/26/2016   CREATININE 1.08 11/14/2013   PROT 6.4 (L) 03/22/2016   ALBUMIN 3.3 (L) 03/22/2016   BILITOT 1.0 03/22/2016   ALKPHOS 63 03/22/2016   AST 18 03/22/2016   ALT 16 (L) 03/22/2016  .   Total Time in preparing paper work, data evaluation and todays exam - 35 minutes  Thurnell Lose M.D on 03/26/2016 at 8:29 AM  [facility]   Office  [phone]

## 2016-03-26 NOTE — Discharge Instructions (Signed)
Follow with Primary MD [name]., MD in 3-4 days   Get CBC, CMP, 2 view Chest X ray checked  by Primary MD  in 3 days ( we routinely change or add medications that can affect your baseline labs and fluid status, therefore we recommend that you get the mentioned basic workup next visit with your PCP, your PCP may decide not to get them or add new tests based on their clinical decision)   Activity: As tolerated with Full fall precautions use walker/cane & assistance as needed   Disposition Home     Diet:   Heart Healthy - Fluid restriction: 1200 mL Fluid / day  For Heart failure patients - Check your Weight same time everyday, if you gain over 2 pounds, or you develop in leg swelling, experience more shortness of breath or chest pain, call your Primary MD immediately. Follow Cardiac Low Salt Diet and 1.5 lit/day fluid restriction.   On your next visit with your primary care physician please Get Medicines reviewed and adjusted.   Please request your Prim.MD to go over all Hospital Tests and Procedure/Radiological results at the follow up, please get all Hospital records sent to your Prim MD by signing hospital release before you go home.   If you experience worsening of your admission symptoms, develop shortness of breath, life threatening emergency, suicidal or homicidal thoughts you must seek medical attention immediately by calling 911 or calling your MD immediately  if symptoms less severe.  You Must read complete instructions/literature along with all the possible adverse reactions/side effects for all the Medicines you take and that have been prescribed to you. Take any new Medicines after you have completely understood and accpet all the possible adverse reactions/side effects.   Do not drive, operate heavy machinery, perform activities at heights, swimming or participation in water activities or provide baby sitting services if your were admitted for syncope or siezures until  you have seen by Primary MD or a Neurologist and advised to do so again.  Do not drive when taking Pain medications.    Do not take more than prescribed Pain, Sleep and Anxiety Medications  Special Instructions: If you have smoked or chewed Tobacco  in the last 2 yrs please stop smoking, stop any regular Alcohol  and or any Recreational drug use.  Wear Seat belts while driving.   Please note  You were cared for by a hospitalist during your hospital stay. If you have any questions about your discharge medications or the care you received while you were in the hospital after you are discharged, you can call the unit and asked to speak with the hospitalist on call if the hospitalist that took care of you is not available. Once you are discharged, your primary care physician will handle any further medical issues. Please note that NO REFILLS for any discharge medications will be authorized once you are discharged, as it is imperative that you return to your primary care physician (or establish a relationship with a primary care physician if you do not have one) for your aftercare needs so that they can reassess your need for medications and monitor your lab values.

## 2016-03-29 ENCOUNTER — Other Ambulatory Visit (HOSPITAL_COMMUNITY)
Admission: RE | Admit: 2016-03-29 | Discharge: 2016-03-29 | Disposition: A | Discharge status: Home / Self Care | Payer: Medicare Other | Source: Ambulatory Visit | Attending: Internal Medicine | Admitting: Internal Medicine

## 2016-03-29 ENCOUNTER — Encounter (HOSPITAL_COMMUNITY): Payer: Medicare Other | Attending: Hematology & Oncology

## 2016-03-29 DIAGNOSIS — D649 Anemia, unspecified: Secondary | ICD-10-CM | POA: Diagnosis not present

## 2016-03-29 DIAGNOSIS — I509 Heart failure, unspecified: Secondary | ICD-10-CM | POA: Diagnosis not present

## 2016-03-29 DIAGNOSIS — R201 Hypoesthesia of skin: Secondary | ICD-10-CM | POA: Diagnosis not present

## 2016-03-29 DIAGNOSIS — I5032 Chronic diastolic (congestive) heart failure: Secondary | ICD-10-CM | POA: Diagnosis present

## 2016-03-29 DIAGNOSIS — Z6841 Body Mass Index (BMI) 40.0 and over, adult: Secondary | ICD-10-CM | POA: Diagnosis not present

## 2016-03-29 DIAGNOSIS — J9600 Acute respiratory failure, unspecified whether with hypoxia or hypercapnia: Secondary | ICD-10-CM | POA: Diagnosis not present

## 2016-03-29 DIAGNOSIS — R718 Other abnormality of red blood cells: Secondary | ICD-10-CM | POA: Diagnosis not present

## 2016-03-29 DIAGNOSIS — Z1389 Encounter for screening for other disorder: Secondary | ICD-10-CM | POA: Diagnosis not present

## 2016-03-29 LAB — CBC WITH DIFFERENTIAL/PLATELET
BASOS ABS: 0 10*3/uL (ref 0.0–0.1)
Basophils Relative: 0 %
EOS ABS: 0.1 10*3/uL (ref 0.0–0.7)
Eosinophils Relative: 1 %
HCT: 45.4 % (ref 39.0–52.0)
Hemoglobin: 15 g/dL (ref 13.0–17.0)
LYMPHS ABS: 0.9 10*3/uL (ref 0.7–4.0)
Lymphocytes Relative: 7 %
MCH: 25.9 pg — ABNORMAL LOW (ref 26.0–34.0)
MCHC: 33 g/dL (ref 30.0–36.0)
MCV: 78.3 fL (ref 78.0–100.0)
MONO ABS: 1.1 10*3/uL — AB (ref 0.1–1.0)
Monocytes Relative: 8 %
Neutro Abs: 11.4 10*3/uL — ABNORMAL HIGH (ref 1.7–7.7)
Neutrophils Relative %: 84 %
PLATELETS: 131 10*3/uL — AB (ref 150–400)
RBC: 5.8 MIL/uL (ref 4.22–5.81)
RDW: 18.5 % — AB (ref 11.5–15.5)
WBC Morphology: INCREASED
WBC: 13.5 10*3/uL — AB (ref 4.0–10.5)

## 2016-03-29 LAB — COMPREHENSIVE METABOLIC PANEL
ALT: 16 U/L — AB (ref 17–63)
AST: 23 U/L (ref 15–41)
Albumin: 3.6 g/dL (ref 3.5–5.0)
Alkaline Phosphatase: 66 U/L (ref 38–126)
Anion gap: 8 (ref 5–15)
BUN: 15 mg/dL (ref 6–20)
CHLORIDE: 97 mmol/L — AB (ref 101–111)
CO2: 29 mmol/L (ref 22–32)
CREATININE: 1.18 mg/dL (ref 0.61–1.24)
Calcium: 9.3 mg/dL (ref 8.9–10.3)
GFR calc non Af Amer: 58 mL/min — ABNORMAL LOW (ref >60)
Glucose, Bld: 153 mg/dL — ABNORMAL HIGH (ref 65–99)
Potassium: 3.3 mmol/L — ABNORMAL LOW (ref 3.5–5.1)
SODIUM: 134 mmol/L — AB (ref 135–145)
Total Bilirubin: 0.8 mg/dL (ref 0.3–1.2)
Total Protein: 6.7 g/dL (ref 6.5–8.1)

## 2016-03-29 LAB — IRON AND TIBC
IRON: 30 ug/dL — AB (ref 45–182)
SATURATION RATIOS: 9 % — AB (ref 17.9–39.5)
TIBC: 323 ug/dL (ref 250–450)
UIBC: 293 ug/dL

## 2016-03-29 LAB — RETICULOCYTES
RBC.: 5.85 MIL/uL — ABNORMAL HIGH (ref 4.22–5.81)
RETIC COUNT ABSOLUTE: 134.6 10*3/uL (ref 19.0–186.0)
Retic Ct Pct: 2.3 % (ref 0.4–3.1)

## 2016-03-29 LAB — FOLATE: FOLATE: 27.1 ng/mL (ref >5.9)

## 2016-03-29 LAB — FERRITIN: Ferritin: 21 ng/mL — ABNORMAL LOW (ref 24–336)

## 2016-03-29 LAB — VITAMIN B12: Vitamin B-12: 668 pg/mL (ref 180–914)

## 2016-04-05 DIAGNOSIS — M47817 Spondylosis without myelopathy or radiculopathy, lumbosacral region: Secondary | ICD-10-CM | POA: Diagnosis not present

## 2016-04-05 DIAGNOSIS — G894 Chronic pain syndrome: Secondary | ICD-10-CM | POA: Diagnosis not present

## 2016-04-05 DIAGNOSIS — M961 Postlaminectomy syndrome, not elsewhere classified: Secondary | ICD-10-CM | POA: Diagnosis not present

## 2016-04-05 DIAGNOSIS — M545 Low back pain: Secondary | ICD-10-CM | POA: Diagnosis not present

## 2016-04-08 ENCOUNTER — Encounter: Payer: Self-pay | Admitting: Adult Health

## 2016-04-08 ENCOUNTER — Ambulatory Visit (INDEPENDENT_AMBULATORY_CARE_PROVIDER_SITE_OTHER): Payer: Medicare Other | Admitting: Adult Health

## 2016-04-08 VITALS — BP 132/62 | HR 97 | Ht 66.0 in | Wt 275.0 lb

## 2016-04-08 DIAGNOSIS — I10 Essential (primary) hypertension: Secondary | ICD-10-CM

## 2016-04-08 DIAGNOSIS — I5032 Chronic diastolic (congestive) heart failure: Secondary | ICD-10-CM

## 2016-04-08 MED ORDER — TORSEMIDE 20 MG PO TABS: 40.0000 mg | ORAL_TABLET | Freq: Two times a day (BID) | ORAL | 3 refills | Status: DC

## 2016-04-08 MED ORDER — POTASSIUM CHLORIDE CRYS ER 20 MEQ PO TBCR: 20.0000 meq | EXTENDED_RELEASE_TABLET | Freq: Two times a day (BID) | ORAL | 3 refills | Status: DC

## 2016-04-08 MED ORDER — METOPROLOL TARTRATE 25 MG PO TABS: 25.0000 mg | ORAL_TABLET | Freq: Every day | ORAL | 3 refills | Status: DC

## 2016-04-08 MED ORDER — METOLAZONE 2.5 MG PO TABS: 2.5000 mg | ORAL_TABLET | ORAL | 0 refills | Status: DC

## 2016-04-08 NOTE — Progress Notes (Signed)
Name: [name] [name]    DOB: April 14, 1941  Age: 75 y.o.  MR#: [id number]       PCP:  [name]., MD      Insurance: Payor: MEDICARE / Plan: MEDICARE PART A AND B / Product Type: *No Product type* /   CC:   No chief complaint on file.   VS Vitals:   04/08/16 1428  BP: 132/62  Pulse: 97  SpO2: 93%  Weight: 275 lb (124.7 kg)  Height: 5\' 6"  (1.676 m)    Weights Current Weight  04/08/16 275 lb (124.7 kg)  03/26/16 268 lb 8 oz (121.8 kg)  03/20/16 270 lb (122.5 kg)    Blood Pressure  BP Readings from Last 3 Encounters:  04/08/16 132/62  03/26/16 133/75  03/20/16 121/67     Admit date:  (Not on file) Last encounter with RMR:  Visit date not found   Allergy Aspirin and Nexium [esomeprazole magnesium]  Current Outpatient Prescriptions  Medication Sig Dispense Refill  . acetaminophen (TYLENOL) 325 MG tablet Take 650 mg by mouth every 6 (six) hours as needed.    Marland Kitchen amLODipine (NORVASC) 5 MG tablet Take 5 mg by mouth daily.    . bisacodyl (DULCOLAX) 5 MG EC tablet Take 1 tablet (5 mg total) by mouth daily. 30 tablet 0  . calcium citrate (CALCITRATE - DOSED IN MG ELEMENTAL CALCIUM) 950 MG tablet Take 200 mg of elemental calcium by mouth daily.    . Cholecalciferol (VITAMIN D3) 1000 units CAPS Take 1 capsule by mouth daily.     Marland Kitchen docusate sodium (COLACE) 100 MG capsule Take 200 mg by mouth daily as needed for mild constipation. Reported on 09/11/2015    . dorzolamide-timolol (COSOPT) 22.3-6.8 MG/ML ophthalmic solution Place 1 drop into both eyes 2 (two) times daily.    . ferrous sulfate 325 (65 FE) MG tablet Take 325 mg by mouth daily with breakfast.    . Ginger, Zingiber officinalis, (GINGER ROOT PO) Take 1 tablet by mouth 3 (three) times daily.     Marland Kitchen latanoprost (XALATAN) 0.005 % ophthalmic solution Place 1 drop into both eyes at bedtime.    . magnesium oxide (MAG-OX) 400 MG tablet Take 400 mg by mouth daily.    . metolazone (ZAROXOLYN) 2.5 MG tablet Take 1 tablet (2.5 mg total)  by mouth every Wednesday. 10 tablet 0  . metoprolol tartrate (LOPRESSOR) 25 MG tablet Take 25 mg by mouth daily.    . Oxycodone HCl 10 MG TABS Take 10 mg by mouth 4 (four) times daily as needed (pain).    . pantoprazole (PROTONIX) 40 MG tablet Take 40 mg by mouth 2 (two) times daily.    Vladimir Faster Glycol-Propyl Glycol (LUBRICANT EYE DROPS) 0.4-0.3 % SOLN Apply 1 drop to eye daily as needed (for lubricant eye drops).    . potassium chloride SA (K-DUR,KLOR-CON) 20 MEQ tablet Take 1 tablet (20 mEq total) by mouth 2 (two) times daily. 60 tablet 0  . tiZANidine (ZANAFLEX) 4 MG tablet Take 4 mg by mouth 3 (three) times daily.     Marland Kitchen torsemide (DEMADEX) 20 MG tablet Take 2 tablets (40 mg total) by mouth 2 (two) times daily. Resume on 10/19 (Patient taking differently: Take 40 mg by mouth daily as needed (fluid). )     No current facility-administered medications for this visit.     Discontinued Meds:   There are no discontinued medications.  Patient Active Problem List   Diagnosis Date Noted  . Acute  on chronic diastolic congestive heart failure ([place]) 03/23/2016  . Back pain 03/22/2016  . Acute right-sided low back pain with right-sided sciatica   . S/P nasal septoplasty 10/07/2015  . Parotitis 03/06/2015  . Chronic diastolic CHF (congestive heart failure) ([place]) 03/06/2015  . Parotitis, acute 03/06/2015  . Injection site irritation 03/06/2015  . Nausea without vomiting 12/08/2014  . Dysphagia, pharyngoesophageal phase   . History of colonic polyps   . Hypokalemia 09/01/2013  . Dehydration 09/01/2013  . Chest pain 09/01/2013  . Leukocytosis 09/01/2013  . Right-sided heart failure 08/20/2013  . Acute on chronic diastolic heart failure ([place]) 08/20/2013  . Acute on chronic diastolic CHF [id number]  . Diastolic dysfunction by echo Dec 2014 07/30/2013  . Morbid obesity- BMI 44.5 07/30/2013  . Edema of both legs 07/30/2013  . Bilateral lower leg cellulitis 07/18/2013  . Pre-operative  cardiovascular examination 06/12/2013  . 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 Right heart failure 11/27/2012  . OSA (obstructive sleep apnea) 11/27/2012  . Other spondylosis with radiculopathy, lumbar region 08/22/2012  . Bright red blood per rectum 06/15/2012  . Constipation 02/29/2012  . Edema 02/29/2012  . SIRS (systemic inflammatory response syndrome) ([place]) 02/27/2012  . UTI (lower urinary tract infection) 02/27/2012  . BPH (benign prostatic hyperplasia) 02/27/2012  . Obesity 02/27/2012  . Hyperlipidemia 02/27/2012  . Thrombocytopenia ([place]) 12/30/2011  . Hypothyroidism 10/27/2011  . Upper abdominal pain 10/27/2011  . Anorexia 10/27/2011  . Bowel habit changes 10/27/2011  . Constipation 04/20/2011  . Rectal bleeding 11/16/2010  . NAUSEA 06/08/2010  . DIARRHEA 06/08/2010  . COLONIC POLYPS, ADENOMATOUS, HX OF 07/16/2009  . Essential hypertension 06/19/2009  . GASTROESOPHAGEAL REFLUX DISEASE, CHRONIC 06/19/2009  . FATTY LIVER DISEASE 06/19/2009  . CHOLELITHIASIS, SYMPTOMATIC 06/19/2009  . RENAL INSUFFICIENCY 06/19/2009  . ABDOMINAL BLOATING 06/19/2009    LABS    Component Value Date/Time   NA 134 (L) 03/29/2016 1337   NA 135 03/26/2016 0504   NA 137 03/24/2016 0620   K 3.3 (L) 03/29/2016 1337   K 3.4 (L) 03/26/2016 0504   K 2.9 (L) 03/25/2016 0619   CL 97 (L) 03/29/2016 1337   CL 89 (L) 03/26/2016 0504   CL 94 (L) 03/24/2016 0620   CO2 29 03/29/2016 1337   CO2 36 (H) 03/26/2016 0504   CO2 37 (H) 03/24/2016 0620   GLUCOSE 153 (H) 03/29/2016 1337   GLUCOSE 149 (H) 03/26/2016 0504   GLUCOSE 105 (H) 03/24/2016 0620   BUN 15 03/29/2016 1337   BUN 20 03/26/2016 0504   BUN 22 (H) 03/24/2016 0620   CREATININE 1.18 03/29/2016 1337   CREATININE 1.35 (H) 03/26/2016 0504   CREATININE 1.13 03/24/2016 0620   CREATININE 1.08 11/14/2013 1125   CREATININE 1.39 (H) 08/30/2013 0940   CREATININE 0.88 10/27/2011 1141   CALCIUM 9.3 03/29/2016 1337   CALCIUM 9.2  03/26/2016 0504   CALCIUM 9.0 03/24/2016 0620   GFRNONAA 58 (L) 03/29/2016 1337   GFRNONAA 49 (L) 03/26/2016 0504   GFRNONAA >60 03/24/2016 0620   GFRAA >60 03/29/2016 1337   GFRAA 57 (L) 03/26/2016 0504   GFRAA >60 03/24/2016 0620   CMP     Component Value Date/Time   NA 134 (L) 03/29/2016 1337   K 3.3 (L) 03/29/2016 1337   CL 97 (L) 03/29/2016 1337   CO2 29 03/29/2016 1337   GLUCOSE 153 (H) 03/29/2016 1337   BUN 15 03/29/2016 1337   CREATININE 1.18 03/29/2016 1337   CREATININE  1.08 11/14/2013 1125   CALCIUM 9.3 03/29/2016 1337   PROT 6.7 03/29/2016 1337   ALBUMIN 3.6 03/29/2016 1337   AST 23 03/29/2016 1337   ALT 16 (L) 03/29/2016 1337   ALKPHOS 66 03/29/2016 1337   BILITOT 0.8 03/29/2016 1337   GFRNONAA 58 (L) 03/29/2016 1337   GFRAA >60 03/29/2016 1337       Component Value Date/Time   WBC 13.5 (H) 03/29/2016 1337   WBC 11.1 (H) 03/24/2016 0620   WBC 13.8 (H) 03/23/2016 0554   HGB 15.0 03/29/2016 1337   HGB 13.1 03/24/2016 0620   HGB 13.3 03/23/2016 0554   HCT 45.4 03/29/2016 1337   HCT 41.2 03/24/2016 0620   HCT 42.0 03/23/2016 0554   MCV 78.3 03/29/2016 1337   MCV 78.5 03/24/2016 0620   MCV 78.8 03/23/2016 0554    Lipid Panel     Component Value Date/Time   CHOL 115 05/04/2013 0430   TRIG 131 05/04/2013 0430   HDL 39 (L) 05/04/2013 0430   CHOLHDL 2.9 05/04/2013 0430   VLDL 26 05/04/2013 0430   LDLCALC 50 05/04/2013 0430    ABG No results found for: PHART, PCO2ART, PO2ART, HCO3, TCO2, ACIDBASEDEF, O2SAT   Lab Results  Component Value Date   TSH 1.074 03/22/2016   BNP (last 3 results)  Recent Labs  10/19/15 1216 03/22/16 0832  BNP 12.0 24.0    ProBNP (last 3 results) No results for input(s): PROBNP in the last 8760 hours.  Cardiac Panel (last 3 results) No results for input(s): CKTOTAL, CKMB, TROPONINI, RELINDX in the last 72 hours.  Iron/TIBC/Ferritin/ %Sat    Component Value Date/Time   IRON 30 (L) 03/29/2016 1337   TIBC 323  03/29/2016 1337   FERRITIN 21 (L) 03/29/2016 1337   IRONPCTSAT 9 (L) 03/29/2016 1337     EKG Orders placed or performed in visit on 03/22/16  . EKG 12-Lead     Prior Assessment and Plan Problem List as of 04/08/2016 Reviewed: 02/10/2016  5:30 PM by Doy Mince     Cardiovascular and Mediastinum   Essential hypertension   Last Assessment & Plan 07/30/2013 Office Visit Written 07/30/2013  4:42 PM by Erlene Quan, PA-C    .      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      Right heart failure   Last Assessment & Plan 06/12/2013 Office Visit Written 06/12/2013  4:52 PM by Erlene Quan, PA-C    Recent echo 12/14 shows normal LVF, grade 1 diastolic dysfunction, RV not well seen, no PA pressures recorded      Acute on chronic diastolic CHF   Last Assessment & Plan 07/30/2013 Office Visit Written 07/30/2013  4:38 PM by Erlene Quan, PA-C    Admitted 07/18/13-/07/22/13. Diuresed 14 lbs.      Right-sided heart failure   Acute on chronic diastolic heart failure ([facility])   Chronic diastolic CHF (congestive heart failure) ([facility])   Acute on chronic diastolic congestive heart failure ([facility])     Respiratory   OSA (obstructive sleep apnea)   Last Assessment & Plan 07/30/2013 Office Visit Written 07/30/2013  4:40 PM by Erlene Quan, PA-C    He would like to try C-pap again, last sleep study 4 yrs ago, will repeat         Digestive   GASTROESOPHAGEAL REFLUX DISEASE,  CHRONIC   Last Assessment & Plan 06/12/2015 Office Visit Written 06/12/2015  8:44 AM by Mahala Menghini, PA-C    Doing very well. Requires pantoprazole twice a day but utilizes Zofran just as needed, not daily. Reiterated antireflux measures. Continue current regimen. Return to the office in 6 months or sooner if needed.      FATTY LIVER DISEASE   Last Assessment & Plan 12/23/2011 Office Visit Edited 12/24/2011  3:56 PM by Andria Meuse, NP    Normal LFTs Instructions for fatty liver: Recommend 1-2# weight loss per week until ideal body weight through exercise & diet. Low fat/cholesterol diet. Gradually increase exercise from 15 min daily up to 1 hr per day 5 days/week. See if Dr. Gerarda Fraction is okay with you doing water aerobics at the [facility]. Limit alcohol use.       CHOLELITHIASIS, SYMPTOMATIC   Rectal bleeding   Last Assessment & Plan 11/15/2010 Office Visit Edited 11/17/2010  9:10 AM by Orvil Feil, NP    75 year old male with recent EGD, colonoscopy, and small bowel study on file. Question of transient focal ischemia on small bowel imaging. Rectal bleeding intermittent and varies; sometimes paper hematochezia, sometimes gush. Concern for intermittent episodes of ischemia; in light of pt's co-morbidities, can't be entirely ruled out. No significant change in bowel habits. Will likely need further studies to determine source. In interim, will provide anusol suppositories, follow low-residue diet, CBC stat. Will contact pt with results and further work-up. (?source of GI bleed?)  Addendum: Discussed with Dr. Thornton Papas most recent CT scan in March. Reviewed mesenteric vasculature. All widely patent, very little plaque noted.  Will discuss with Dr. Oneida Alar further work-up for source of continued intermittent bleeding.      Constipation   Last Assessment & Plan 04/20/2011 Office Visit Written 04/20/2011  4:57 PM by Orvil Feil, NP    Intermittent constipation, recent colonoscopy on file. Will institute miralax as needed. High fiber diet. 3 mos f/u.       Constipation   Last Assessment & Plan 06/12/2015 Office Visit Written 06/12/2015  8:45 AM by Mahala Menghini, PA-C    Continues to be manageable with Colace. Discussed other options. If he requires further change in therapy he'll let me know.      Dysphagia, pharyngoesophageal phase   Parotitis   Parotitis, acute     Endocrine   Hypothyroidism     Musculoskeletal and  Integument   Other spondylosis with radiculopathy, lumbar region     Genitourinary   RENAL INSUFFICIENCY   UTI (lower urinary tract infection)   BPH (benign prostatic hyperplasia)     Other   Thrombocytopenia [facility])   Last Assessment & Plan 02/10/2016 Office Visit Edited 02/10/2016 11:01 AM by Baird Cancer, PA-C    Thrombocytopenia dating back to at least 2009 ranging in the 80's to 150's.  Very stable.    Labs today CBC diff.  I personally reviewed and went over laboratory results with the patient.  The results are noted within this dictation.  At this time, labs are pending. He asks that we mail him a copy of his laboratory work which we can do.  Labs in 6 months: CBC diff.  Return in 6 months for follow-up      Diastolic dysfunction by echo Dec 2014   Last Assessment & Plan 07/30/2013 Office Visit Written 07/30/2013  4:39 PM by Erlene Quan, PA-C    .  Morbid obesity- BMI 44.5   Last Assessment & Plan 07/30/2013 Office Visit Written 07/30/2013  4:41 PM by Erlene Quan, PA-C    .       Edema of both legs   Last Assessment & Plan 07/30/2013 Office Visit Written 07/30/2013  4:43 PM by Erlene Quan, PA-C    .      ABDOMINAL BLOATING   Last Assessment & Plan 03/14/2012 Office Visit Edited 03/14/2012  2:38 PM by Orvil Feil, NP    Multifactorial. Extensive work-up as above. Empirically treated for SBBO but did not tolerate full dosing of Augmentin. If recurrent abdominal pain/bloating/diarrhea, consider different regimen of treatment. EGD up to date, TCS in Feb 2017. Multiple CTs on file. Improvement with Probiotic. Continue daily and provide samples as necessary.  Return in 6 mos .      COLONIC POLYPS, ADENOMATOUS, HX OF   Last Assessment & Plan 03/14/2012 Office Visit Written 03/14/2012  2:36 PM by Orvil Feil, NP    Surveillance Feb 2017.       NAUSEA   Last Assessment & Plan 04/20/2011 Office Visit Written 04/20/2011  4:57 PM by Orvil Feil, NP    ? R/t gastritis.  Consider GES if no improvement after PPI. Last GES in 2008 normal      DIARRHEA   Last Assessment & Plan 12/27/2011 Office Visit Written 12/31/2011 11:16 PM by Orvil Feil, NP    Stool studies. Question self-limiting process. Progress report tomorrow. Bland diet, kaopectate as needed. Cancel HBT and reschedule once current episode resolves.       Upper abdominal pain   Last Assessment & Plan 12/27/2011 Office Visit Written 12/31/2011 11:15 PM by Orvil Feil, NP    Epigastric discomfort associated with nausea, vomiting, dry heaves. Acute. Question viral etiology at this point. Labs unrevealing. CT angiogram performed due to reports of SOB. Negative for PE. ?of evolving pneumonia vs atelectasis on CXR. Pt afebrile, VSS, will send CXR to PCP for their records and instruct pt to seek medical attention if any respiratory changes. Complaints of SOB seem to occur with pain discomfort. No chest pain.   Bland diet for now Fill zofran that was prescribed from ED Progress report tomorrow Send CXR, CTA to PCP To ED if symptoms worsen, chest pain, diaphoresis      Anorexia   Bowel habit changes   SIRS (systemic inflammatory response syndrome) ([place])   Obesity   Last Assessment & Plan 06/12/2013 Office Visit Written 06/12/2013  4:50 PM by Erlene Quan, PA-C    .      Hyperlipidemia   Edema   Last Assessment & Plan 06/12/2013 Office Visit Written 06/12/2013  4:50 PM by Erlene Quan, PA-C    Chronic lower extremity edema      Bright red blood per rectum   Pre-operative cardiovascular examination   Last Assessment & Plan 06/12/2013 Office Visit Written 06/12/2013  4:49 PM by Erlene Quan, PA-C    Needs pre op clearance for prostate surgery      Bilateral lower leg cellulitis   Hypokalemia   Dehydration   Chest pain   Leukocytosis   Last Assessment & Plan 02/10/2016 Office Visit Edited 02/10/2016 11:01 AM by Baird Cancer, PA-C    Leukocytosis with neutrophilia dating back to at least 2010; JAK2  and BCR/ABL negative.  No documented peripheral flow cytometry.    Labs in 6 months: CBC diff.  History of colonic polyps   Nausea without vomiting   Injection site irritation   S/P nasal septoplasty   Acute right-sided low back pain with right-sided sciatica   Back pain       Imaging: Dg Chest 2 View  Result Date: 03/22/2016 CLINICAL DATA:  abdominal distension. Dyspnea on exertion. Lower extremity edema. EXAM: CHEST  2 VIEW COMPARISON:  01/26/2016. FINDINGS: Lateral view degraded by patient arm position and artifact posteriorly. Midline trachea. Borderline cardiomegaly. Mediastinal contours otherwise within normal limits. Numerous leads and wires project over the chest. Frontal view is also mildly degraded by patient body habitus and portable technique. No pleural effusion or pneumothorax. Mild bibasilar atelectasis suspected. No congestive failure. IMPRESSION: Borderline cardiomegaly and mild bibasilar atelectasis. No acute findings. Electronically Signed   By: Abigail Miyamoto M.D.   On: 03/22/2016 09:42   Ct Head Wo Contrast  Result Date: 03/22/2016 CLINICAL DATA:  Severe diffuse headache. EXAM: CT HEAD WITHOUT CONTRAST TECHNIQUE: Contiguous axial images were obtained from the base of the skull through the vertex without intravenous contrast. COMPARISON:  03/02/2015 FINDINGS: Brain: There is no evidence of acute cortical infarct, intracranial hemorrhage, mass, midline shift, or extra-axial fluid collection. The ventricles and sulci are normal. Vascular: No hyperdense vessel or unexpected calcification. Skull: No fracture or focal osseous lesion. Sinuses/Orbits: Visualized paranasal sinuses and mastoid air cells are clear. Postoperative changes to the globes. Other: None. IMPRESSION: No evidence of acute intracranial abnormality. Electronically Signed   By: Logan Bores M.D.   On: 03/22/2016 10:00   Ct Angio Abd/pel W And/or Wo Contrast  Result Date: 03/22/2016 CLINICAL DATA:  Low  back pain that radiates to the right leg. Bilateral leg swelling. EXAM: CT ANGIOGRAPHY ABDOMEN AND PELVIS WITH CONTRAST AND WITHOUT CONTRAST TECHNIQUE: Multidetector CT imaging of the abdomen and pelvis was performed using the standard protocol during bolus administration of intravenous contrast. Multiplanar reconstructed images and MIPs were obtained and reviewed to evaluate the vascular anatomy. CONTRAST:  100 mL Isovue 370 COMPARISON:  CT 06/09/2013 and abdominal CT 07/24/2010 FINDINGS: VASCULAR Aorta: Normal caliber of the abdominal aorta without dissection. Minimal atherosclerotic plaque in the distal abdominal aorta. No significant aortic stenosis. Celiac: Patent without evidence of aneurysm, dissection, vasculitis or significant stenosis. SMA: Patent without evidence of aneurysm, dissection, vasculitis or significant stenosis. Renals: Both renal arteries are patent without evidence of aneurysm, dissection, vasculitis, fibromuscular dysplasia or significant stenosis. IMA: Patent without evidence of aneurysm, dissection, vasculitis or significant stenosis. Inflow: Minimal calcified plaque in the iliac arteries. No significant stenosis. No evidence for aneurysm. Proximal Outflow: Bilateral common femoral and visualized portions of the superficial and profunda femoral arteries are Patent without evidence of aneurysm, dissection, vasculitis or significant stenosis. Veins: No obvious venous abnormality within the limitations of this arterial phase study. Review of the MIP images confirms the above findings. NON-VASCULAR Lower chest: Motion artifact at the lung bases. There is volume loss in the lower lobes. No large pleural effusions. Hepatobiliary: There are multiple areas of hyperdensity and presumed contrast enhancement throughout the liver. Some of these have a nodular appearance such as in the left hepatic lobe measuring 1.7 cm on sequence 6, image 40. Some of these areas may represent transient hepatic  attenuation differences. Gallbladder has been removed. Portal venous system appears to be patent but limited evaluation on this arterial phase of contrast. Pancreas: Normal appearance of the pancreas without inflammation or duct dilatation. Spleen: Normal appearance of spleen without enlargement. Adrenals/Urinary Tract: Normal adrenal glands.  Urinary bladder is unremarkable. Evidence for bilateral renal cysts. No hydronephrosis. Stomach/Bowel: Normal appearance of the stomach and duodenum. There is laxity along the anterior abdominal wall. There is a large amount of bowel in the anterior upper abdomen. The cecum is located in the mid right abdomen. Normal appearance of the appendix. There is no evidence for bowel dilatation or obstruction. Lymphatic: No significant lymph node enlargement in the abdomen or pelvis. Reproductive: Prostate is unremarkable. Other: No free fluid. There is tiny umbilical hernia containing fat. Low attenuating structure in the right inguinal canal could be related to the right testicle but cannot exclude fluid in this area. Findings are similar to the prior CT. Musculoskeletal: Pedicle screw and rod fixation at L4-L5 with interbody device. IMPRESSION: VASCULAR Minimal atherosclerotic disease in the abdomen and pelvis. No evidence for dissection or aneurysm. NON-VASCULAR No acute abnormality in the abdomen or pelvis. Multiple hyperattenuating areas or structures throughout the liver. This could be related to transient hepatic attenuation differences during the arterial phase of imaging. However, some of these areas are nodular and cannot exclude small enhancing nodules. Consider a dedicated liver MRI to exclude liver lesions. Bilateral renal cysts. Electronically Signed   By: Markus Daft M.D.   On: 03/22/2016 10:22

## 2016-04-08 NOTE — Patient Instructions (Signed)
Medication Instructions:  Your physician recommends that you continue on your current medications as directed. Please refer to the Current Medication list given to you today.   Labwork: NONE  Testing/Procedures: NONE  Follow-Up: Your physician recommends that you schedule a follow-up appointment in: 3 MONTHS    Any Other Special Instructions Will Be Listed Below (If Applicable).     If you need a refill on your cardiac medications before your next appointment, please call your pharmacy.   

## 2016-04-08 NOTE — Progress Notes (Signed)
Cardiology Office Note   Date:  04/08/2016   ID:  [name] [name], DOB 01/13/1941, MRN [id number]  PCP:  [name]., MD  Cardiologist: Cloria Spring, NP   No chief complaint on file.     History of Present Illness: [name] [name] is a 75 y.o. male who presents for ongoing assessment and management of hronic diastolic heart failure, history of labile hypertension, hyperlipidemia, and obstructive sleep apnea on CPAP. The patient was last seen inhe office by Dr. Harl Bowie on 01/29/2016 after being seen in the ER with dizziness and hypotension.the patient was stable on last office visit and was to follow-up in 3 months with blood pressure log. No medication changes were recommend.  The patient was a to the hospital11/08/2015 in the setting of abdominal pain and back pain. Found to have acute respiratory failure due to acute on chronic diastolic heart failure. He was diuresed, 7 L. He was counseled on fluid and salt restriction. He was continued on amlodipine and beta blocker.   He comes today without any complaint. He continues to weigh himself daily, he is avoiding salty foods, but admits that it has been difficult. He is medically compliant. She denies recurrent chest pain lower extremity edema or dyspnea.  Diagnostic Studies 04/2013 Echo Study Conclusions  - Procedure narrative: Transthoracic echocardiography. Technically difficult study with reduced echocardiographic windows. - Left ventricle: The cavity size was normal. There was moderate concentric hypertrophy. Systolic function was vigorous. The estimated ejection fraction was in the range of 65% to 70%. Wall motion was normal; there were no regional wall motion abnormalities. Doppler parameters are consistent with abnormal left ventricular relaxation (grade 1 diastolic dysfunction). The E/e' ratio is <10, suggesting normal LV filling pressure. - Left atrium: The atrium was normal in size. - Tricuspid valve: Mild  regurgitation. - Pericardium, extracardiac: There was no pericardial effusion.  Past Medical History:  Diagnosis Date  . Bilateral lower leg cellulitis   . BPH (benign prostatic hyperplasia)   . Cataract    left  . CHF (congestive heart failure) ([place])   . Chronic diastolic HF (heart failure) ([place])   . Collagen vascular disease ([place])    history of venous insufficency and LE cellulitis  . Complication of anesthesia    2012 due to sleep apnea pt has had difficulty being put under  and waking up from anesthesia  . Deep venous insufficiency   . GERD (gastroesophageal reflux disease)   . Glaucoma    left  . Headache   . Heel spur    right heel  . History of bladder infections   . History of colonic polyps   . Hyperlipidemia   . Hypertension    Sees Dr. Debara Pickett  . Hypothyroidism    hypothyroid denies no rx  . Kidney stones    passed them  . Obesity   . Osteoarthritis   . PONV (postoperative nausea and vomiting)   . PUD (peptic ulcer disease)   . Ringing in ears   . S/P colonoscopy 2007, Feb and March 2012   hx of adenomas, left-sided diverticula, On 07/2010 TCS, fresh blood and clot noted coming from TI.  . S/P endoscopy 2007, 2012   2007: nl, May 2012: antral erosions  . SIRS (systemic inflammatory response syndrome) ([place]) 02/27/2012  . Sleep apnea    uses cpap  occ "unable to use lately due to nasal pain when use" - sleep study 2008 AHI 48.80/hr and 56.70hr during REM  . Thrombocytopenia ([place]) 12/30/2011  Stable    Past Surgical History:  Procedure Laterality Date  . BACK SURGERY    . CARDIAC CATHETERIZATION  02/27/2007   no sign of CAD, LVH, mod pulm HTN (Dr. Jackie Plum(  . CATARACT EXTRACTION W/ INTRAOCULAR LENS IMPLANT Right ~ 2012  . COLONOSCOPY N/A 05/28/2014   [id number] colonic polyps/colonic diverticulosis. Tubular adenomas. Next colonoscopy January 2021  . ESOPHAGOGASTRODUODENOSCOPY  Aug 2013   [facility], single 4mm pedunculated polyp otherwise normal. HYPERPLASTIC,  NEGATIVE h.pylori  . ESOPHAGOGASTRODUODENOSCOPY N/A 05/28/2014   RMR:non-critical Schatzki's ring/HH. Benign gastritis  . INGUINAL HERNIA REPAIR Right 1941  . KNEE ARTHROSCOPY Left ~ 2000  . LAPAROSCOPIC CHOLECYSTECTOMY    . [place] SURGERY  2012  . MALONEY DILATION N/A 05/28/2014   Procedure: Venia Minks DILATION;  Surgeon: Daneil Dolin, MD;  [facility];  Service: Endoscopy;  Laterality: N/A;  . NASAL SEPTOPLASTY W/ TURBINOPLASTY Bilateral 10/07/2015   Procedure: NASAL SEPTOPLASTY WITH TURBINATE REDUCTION;  Surgeon: Leta Baptist, MD;  Location: [facility];  Service: ENT;  Laterality: Bilateral;  . NASAL SEPTUM SURGERY Bilateral 10/07/2015    inferior turbinate resection  . NM MYOCAR PERF WALL MOTION  12/2006   dipyridamole myoview - stress images show mild perfusion defect in mid inferior walls with reversibility at rest; mild perfusion defect in mid inferolateral wall at stress with mild defect reversibility, EF 62%, abnormal but low risk study   . POSTERIOR LUMBAR FUSION  2015  . SAVORY DILATION N/A 05/28/2014   Procedure: SAVORY DILATION;  Surgeon: Daneil Dolin, MD;  [facility];  Service: Endoscopy;  Laterality: N/A;  . SHOULDER OPEN ROTATOR CUFF REPAIR Left early 2000s  . small bowel capsule study  07/2011   ?transient focal ischemia in distal small bowel to explain GI bleeding  . TRANSTHORACIC ECHOCARDIOGRAM  09/2008   EF 60-65%, mod conc LVH  . TRANSURETHRAL RESECTION OF PROSTATE    . TRANSURETHRAL RESECTION OF PROSTATE N/A 06/18/2013   Procedure: TRANSURETHRAL RESECTION OF THE PROSTATE (TURP);  Surgeon: Marissa Nestle, MD;  Location: [facility];  Service: Urology;  Laterality: N/A;     Current Outpatient Prescriptions  Medication Sig Dispense Refill  . acetaminophen (TYLENOL) 325 MG tablet Take 650 mg by mouth every 6 (six) hours as needed.    Marland Kitchen amLODipine (NORVASC) 5 MG tablet Take 5 mg by mouth daily.    . bisacodyl (DULCOLAX) 5 MG EC tablet Take 1 tablet (5 mg  total) by mouth daily. 30 tablet 0  . calcium citrate (CALCITRATE - DOSED IN MG ELEMENTAL CALCIUM) 950 MG tablet Take 200 mg of elemental calcium by mouth daily.    . Cholecalciferol (VITAMIN D3) 1000 units CAPS Take 1 capsule by mouth daily.     Marland Kitchen docusate sodium (COLACE) 100 MG capsule Take 200 mg by mouth daily as needed for mild constipation. Reported on 09/11/2015    . dorzolamide-timolol (COSOPT) 22.3-6.8 MG/ML ophthalmic solution Place 1 drop into both eyes 2 (two) times daily.    . ferrous sulfate 325 (65 FE) MG tablet Take 325 mg by mouth daily with breakfast.    . Ginger, Zingiber officinalis, (GINGER ROOT PO) Take 1 tablet by mouth 3 (three) times daily.     Marland Kitchen latanoprost (XALATAN) 0.005 % ophthalmic solution Place 1 drop into both eyes at bedtime.    . magnesium oxide (MAG-OX) 400 MG tablet Take 400 mg by mouth daily.    Derrill Memo ON 04/13/2016] metolazone (ZAROXOLYN) 2.5 MG tablet  Take 1 tablet (2.5 mg total) by mouth every Wednesday. 10 tablet 0  . metoprolol tartrate (LOPRESSOR) 25 MG tablet Take 1 tablet (25 mg total) by mouth daily. 90 tablet 3  . Oxycodone HCl 10 MG TABS Take 10 mg by mouth 4 (four) times daily as needed (pain).    . pantoprazole (PROTONIX) 40 MG tablet Take 40 mg by mouth 2 (two) times daily.    Vladimir Faster Glycol-Propyl Glycol (LUBRICANT EYE DROPS) 0.4-0.3 % SOLN Apply 1 drop to eye daily as needed (for lubricant eye drops).    . potassium chloride SA (K-DUR,KLOR-CON) 20 MEQ tablet Take 1 tablet (20 mEq total) by mouth 2 (two) times daily. 60 tablet 3  . tiZANidine (ZANAFLEX) 4 MG tablet Take 4 mg by mouth 3 (three) times daily.     Marland Kitchen torsemide (DEMADEX) 20 MG tablet Take 2 tablets (40 mg total) by mouth 2 (two) times daily. Resume on 10/19 120 tablet 3   No current facility-administered medications for this visit.     Allergies:   Aspirin and Nexium [esomeprazole magnesium]    Social History:  The patient  reports that he quit smoking about 42 years ago.  His smoking use included Cigars. He started smoking about 56 years ago. He quit after 8.00 years of use. He has never used smokeless tobacco. He reports that he drinks alcohol. He reports that he does not use drugs.   Family History:  The patient's family history includes Arthritis in his son and son; Colon cancer (age of onset: 70) in his father; Diabetes Mellitus II in his son; Pancreatic cancer (age of onset: 7) in his mother; Prostate cancer in his brother and father.    ROS: All other systems are reviewed and negative. Unless otherwise mentioned in H&P    PHYSICAL EXAM: VS:  BP 132/62   Pulse 97   Ht 5\' 6"  (1.676 m)   Wt 275 lb (124.7 kg)   SpO2 93%   BMI 44.39 kg/m  , BMI Body mass index is 44.39 kg/m. GEN: Well nourished, well developed, in no acute distress  HEENT: normal bilateral xanthomas Neck: no JVD, carotid bruits, or masses Cardiac: *RRR; no murmurs, rubs, or gallops,no edema  Respiratory:  clear to auscultation bilaterally, normal work of breathing GI: soft, nontender, nondistended, + BS, obese MS: no deformity or atrophy  Skin: warm and dry, no rash Neuro:  Strength and sensation are intact Psych: euthymic mood, full affect  Recent Labs: 03/22/2016: B Natriuretic Peptide 24.0; TSH 1.074 03/26/2016: Magnesium 2.0 03/29/2016: ALT 16; BUN 15; Creatinine, Ser 1.18; Hemoglobin 15.0; Platelets 131; Potassium 3.3; Sodium 134    Lipid Panel    Component Value Date/Time   CHOL 115 05/04/2013 0430   TRIG 131 05/04/2013 0430   HDL 39 (L) 05/04/2013 0430   CHOLHDL 2.9 05/04/2013 0430   VLDL 26 05/04/2013 0430   LDLCALC 50 05/04/2013 0430      Wt Readings from Last 3 Encounters:  04/08/16 275 lb (124.7 kg)  03/26/16 268 lb 8 oz (121.8 kg)  03/20/16 270 lb (122.5 kg)       ASSESSMENT AND PLAN:  1.  Chronic diastolic CHF: Status post hospitalization for decompensation. He diuresed several liters. He is maintaining his weight avoiding salt and is medically  compliant. I discussed with him low-sodium diet as well as being more careful over the pending holidays. He is to be more stringent on daily weights and salt restriction to avoid hospitalization by overindulging  on heavily salted foods which are prominent this year. He verbalizes understanding.  I provided refills on his cardiac medications. He will have a follow-up BMET. We'll see him in 3 months in the [place] office.  2. Hypertension: Blood pressure is currently well-controlled.  3. Hypercholesterolemia: Continue statin therapy.   4. Obesity: He will need to reduce calories and increase exercise.     Current medicines are reviewed at length with the patient today.    Labs/ tests ordered today include: BMET No orders of the defined types were placed in this encounter.    Disposition:   FU with Cardiology in 3 months Signed, Jory Sims, NP  04/08/2016 4:55 PM    [facility] [address], [place], [place] [address] Phone: [phone]; Fax: [phone]

## 2016-04-11 DIAGNOSIS — H401133 Primary open-angle glaucoma, bilateral, severe stage: Secondary | ICD-10-CM | POA: Diagnosis not present

## 2016-05-02 ENCOUNTER — Ambulatory Visit (INDEPENDENT_AMBULATORY_CARE_PROVIDER_SITE_OTHER): Payer: Medicare Other | Admitting: Otolaryngology

## 2016-05-02 DIAGNOSIS — J343 Hypertrophy of nasal turbinates: Secondary | ICD-10-CM

## 2016-05-02 DIAGNOSIS — J310 Chronic rhinitis: Secondary | ICD-10-CM | POA: Diagnosis not present

## 2016-05-09 DIAGNOSIS — H401133 Primary open-angle glaucoma, bilateral, severe stage: Secondary | ICD-10-CM | POA: Diagnosis not present

## 2016-05-10 ENCOUNTER — Encounter: Payer: Self-pay | Admitting: Cardiology

## 2016-05-10 ENCOUNTER — Ambulatory Visit (INDEPENDENT_AMBULATORY_CARE_PROVIDER_SITE_OTHER): Payer: Medicare Other | Admitting: Cardiology

## 2016-05-10 VITALS — BP 148/89 | HR 73 | Ht 66.0 in | Wt 281.6 lb

## 2016-05-10 DIAGNOSIS — I10 Essential (primary) hypertension: Secondary | ICD-10-CM | POA: Diagnosis not present

## 2016-05-10 DIAGNOSIS — I5032 Chronic diastolic (congestive) heart failure: Secondary | ICD-10-CM | POA: Diagnosis not present

## 2016-05-10 DIAGNOSIS — E785 Hyperlipidemia, unspecified: Secondary | ICD-10-CM

## 2016-05-10 MED ORDER — METOLAZONE 2.5 MG PO TABS: 2.5000 mg | ORAL_TABLET | ORAL | 0 refills | Status: DC

## 2016-05-10 NOTE — Progress Notes (Signed)
Clinical Summary Mr. Farrugia is a 75 y.o.male seen today for follow up of the following medical problems.   1. Chronic diastolic heart failure  - echo 04/2013 LVEF [id number], grade I diastolic dysfunction. RV was poorly visualized.  - previously volume status was labile, admissions with volume overload and dehydration. Has done well on torsemide   - admit 03/2016 with volume overload, diuresed 7 liters.  - since last visit up 6 pounds He denies any LE edema. Taking torsemide 20mg  bid. He cut down to this dose on his own due to cramps.   2. OSA - poor compliance with CPAP due to headaches. Reports he is awaiting a new mask   3. Hyperlipidemia - he iscompliant with statin  4. Labile HTN - checks bp at home daily, typically around 130s/80s Past Medical History:  Diagnosis Date  . Bilateral lower leg cellulitis   . BPH (benign prostatic hyperplasia)   . Cataract    left  . CHF (congestive heart failure) ([place])   . Chronic diastolic HF (heart failure) ([place])   . Collagen vascular disease ([place])    history of venous insufficency and LE cellulitis  . Complication of anesthesia    2012 due to sleep apnea pt has had difficulty being put under  and waking up from anesthesia  . Deep venous insufficiency   . GERD (gastroesophageal reflux disease)   . Glaucoma    left  . Headache   . Heel spur    right heel  . History of bladder infections   . History of colonic polyps   . Hyperlipidemia   . Hypertension    Sees Dr. Debara Pickett  . Hypothyroidism    hypothyroid denies no rx  . Kidney stones    passed them  . Obesity   . Osteoarthritis   . PONV (postoperative nausea and vomiting)   . PUD (peptic ulcer disease)   . Ringing in ears   . S/P colonoscopy 2007, Feb and March 2012   hx of adenomas, left-sided diverticula, On 07/2010 TCS, fresh blood and clot noted coming from TI.  . S/P endoscopy 2007, 2012   2007: nl, May 2012: antral erosions  . SIRS (systemic inflammatory  response syndrome) ([place]) 02/27/2012  . Sleep apnea    uses cpap  occ "unable to use lately due to nasal pain when use" - sleep study 2008 AHI 48.80/hr and 56.70hr during REM  . Thrombocytopenia ([place]) 12/30/2011   Stable     Allergies  Allergen Reactions  . Aspirin Other (See Comments)    Nausea and upset stomach    . Nexium [Esomeprazole Magnesium] Other (See Comments)    Patient said it messed his stomach up     Current Outpatient Prescriptions  Medication Sig Dispense Refill  . acetaminophen (TYLENOL) 325 MG tablet Take 650 mg by mouth every 6 (six) hours as needed.    Marland Kitchen amLODipine (NORVASC) 5 MG tablet Take 5 mg by mouth daily.    . bisacodyl (DULCOLAX) 5 MG EC tablet Take 1 tablet (5 mg total) by mouth daily. 30 tablet 0  . calcium citrate (CALCITRATE - DOSED IN MG ELEMENTAL CALCIUM) 950 MG tablet Take 200 mg of elemental calcium by mouth daily.    . Cholecalciferol (VITAMIN D3) 1000 units CAPS Take 1 capsule by mouth daily.     Marland Kitchen docusate sodium (COLACE) 100 MG capsule Take 200 mg by mouth daily as needed for mild constipation. Reported on 09/11/2015    .  dorzolamide-timolol (COSOPT) 22.3-6.8 MG/ML ophthalmic solution Place 1 drop into both eyes 2 (two) times daily.    . ferrous sulfate 325 (65 FE) MG tablet Take 325 mg by mouth daily with breakfast.    . Ginger, Zingiber officinalis, (GINGER ROOT PO) Take 1 tablet by mouth 3 (three) times daily.     Marland Kitchen latanoprost (XALATAN) 0.005 % ophthalmic solution Place 1 drop into both eyes at bedtime.    . magnesium oxide (MAG-OX) 400 MG tablet Take 400 mg by mouth daily.    . metolazone (ZAROXOLYN) 2.5 MG tablet Take 1 tablet (2.5 mg total) by mouth every Wednesday. 10 tablet 0  . metoprolol tartrate (LOPRESSOR) 25 MG tablet Take 1 tablet (25 mg total) by mouth daily. 90 tablet 3  . Oxycodone HCl 10 MG TABS Take 10 mg by mouth 4 (four) times daily as needed (pain).    . pantoprazole (PROTONIX) 40 MG tablet Take 40 mg by mouth 2 (two) times  daily.    Vladimir Faster Glycol-Propyl Glycol (LUBRICANT EYE DROPS) 0.4-0.3 % SOLN Apply 1 drop to eye daily as needed (for lubricant eye drops).    . potassium chloride SA (K-DUR,KLOR-CON) 20 MEQ tablet Take 1 tablet (20 mEq total) by mouth 2 (two) times daily. 60 tablet 3  . tiZANidine (ZANAFLEX) 4 MG tablet Take 4 mg by mouth 3 (three) times daily.     Marland Kitchen torsemide (DEMADEX) 20 MG tablet Take 2 tablets (40 mg total) by mouth 2 (two) times daily. Resume on 10/19 120 tablet 3   No current facility-administered medications for this visit.      Past Surgical History:  Procedure Laterality Date  . BACK SURGERY    . CARDIAC CATHETERIZATION  02/27/2007   no sign of CAD, LVH, mod pulm HTN (Dr. Jackie Plum(  . CATARACT EXTRACTION W/ INTRAOCULAR LENS IMPLANT Right ~ 2012  . COLONOSCOPY N/A 05/28/2014   [id number] colonic polyps/colonic diverticulosis. Tubular adenomas. Next colonoscopy January 2021  . ESOPHAGOGASTRODUODENOSCOPY  Aug 2013   [facility], single 65mm pedunculated polyp otherwise normal. HYPERPLASTIC, NEGATIVE h.pylori  . ESOPHAGOGASTRODUODENOSCOPY N/A 05/28/2014   RMR:non-critical Schatzki's ring/HH. Benign gastritis  . INGUINAL HERNIA REPAIR Right 1941  . KNEE ARTHROSCOPY Left ~ 2000  . LAPAROSCOPIC CHOLECYSTECTOMY    . [place] SURGERY  2012  . MALONEY DILATION N/A 05/28/2014   Procedure: Venia Minks DILATION;  Surgeon: Daneil Dolin, MD;  [facility];  Service: Endoscopy;  Laterality: N/A;  . NASAL SEPTOPLASTY W/ TURBINOPLASTY Bilateral 10/07/2015   Procedure: NASAL SEPTOPLASTY WITH TURBINATE REDUCTION;  Surgeon: Leta Baptist, MD;  Location: [facility];  Service: ENT;  Laterality: Bilateral;  . NASAL SEPTUM SURGERY Bilateral 10/07/2015    inferior turbinate resection  . NM MYOCAR PERF WALL MOTION  12/2006   dipyridamole myoview - stress images show mild perfusion defect in mid inferior walls with reversibility at rest; mild perfusion defect in mid inferolateral wall at stress with mild defect  reversibility, EF 62%, abnormal but low risk study   . POSTERIOR LUMBAR FUSION  2015  . SAVORY DILATION N/A 05/28/2014   Procedure: SAVORY DILATION;  Surgeon: Daneil Dolin, MD;  [facility];  Service: Endoscopy;  Laterality: N/A;  . SHOULDER OPEN ROTATOR CUFF REPAIR Left early 2000s  . small bowel capsule study  07/2011   ?transient focal ischemia in distal small bowel to explain GI bleeding  . TRANSTHORACIC ECHOCARDIOGRAM  09/2008   EF 60-65%, mod conc LVH  . TRANSURETHRAL RESECTION  OF PROSTATE    . TRANSURETHRAL RESECTION OF PROSTATE N/A 06/18/2013   Procedure: TRANSURETHRAL RESECTION OF THE PROSTATE (TURP);  Surgeon: Marissa Nestle, MD;  Location[facility];  Service: Urology;  Laterality: N/A;     Allergies  Allergen Reactions  . Aspirin Other (See Comments)    Nausea and upset stomach    . Nexium [Esomeprazole Magnesium] Other (See Comments)    Patient said it messed his stomach up      Family History  Problem Relation Age of Onset  . Colon cancer Father 34    deceased  . Prostate cancer Father   . Pancreatic cancer Mother 33    deceased  . Prostate cancer Brother   . Arthritis Son   . Arthritis Son   . Diabetes Mellitus II Son      Social History Mr. Dorrance reports that he quit smoking about 42 years ago. His smoking use included Cigars. He started smoking about 56 years ago. He quit after 8.00 years of use. He has never used smokeless tobacco. Mr. Vitulli reports that he drinks alcohol.   Review of Systems CONSTITUTIONAL: No weight loss, fever, chills, weakness or fatigue.  HEENT: Eyes: No visual loss, blurred vision, double vision or yellow sclerae.No hearing loss, sneezing, congestion, runny nose or sore throat.  SKIN: No rash or itching.  CARDIOVASCULAR: per HPI RESPIRATORY: No shortness of breath, cough or sputum.  GASTROINTESTINAL: No anorexia, nausea, vomiting or diarrhea. No abdominal pain or blood.  GENITOURINARY: No burning on urination,  no polyuria NEUROLOGICAL: No headache, dizziness, syncope, paralysis, ataxia, numbness or tingling in the extremities. No change in bowel or bladder control.  MUSCULOSKELETAL: No muscle, back pain, joint pain or stiffness.  LYMPHATICS: No enlarged nodes. No history of splenectomy.  PSYCHIATRIC: No history of depression or anxiety.  ENDOCRINOLOGIC: No reports of sweating, cold or heat intolerance. No polyuria or polydipsia.  Marland Kitchen   Physical Examination Vitals:   05/10/16 0826  BP: (!) 148/89  Pulse: 73   Vitals:   05/10/16 0826  Weight: 281 lb 9.6 oz (127.7 kg)  Height: 5\' 6"  (1.676 m)    Gen: resting comfortably, no acute distress HEENT: no scleral icterus, pupils equal round and reactive, no palptable cervical adenopathy,  CV: RRR, no mr/g, no jvd Resp: Clear to auscultation bilaterally GI: abdomen is soft, non-tender, non-distended, normal bowel sounds, no hepatosplenomegaly MSK: extremities are warm, no edema.  Skin: warm, no rash Neuro:  no focal deficits Psych: appropriate affect   Diagnostic Studies 04/2013 Echo Study Conclusions  - Procedure narrative: Transthoracic echocardiography. Technically difficult study with reduced echocardiographic windows. - Left ventricle: The cavity size was normal. There was moderate concentric hypertrophy. Systolic function was vigorous. The estimated ejection fraction was in the range of 65% to 70%. Wall motion was normal; there were no regional wall motion abnormalities. Doppler parameters are consistent with abnormal left ventricular relaxation (grade 1 diastolic dysfunction). The E/e' ratio is <10, suggesting normal LV filling pressure. - Left atrium: The atrium was normal in size. - Tricuspid valve: Mild regurgitation. - Pericardium, extracardiac: There was no pericardial effusion.     Assessment and Plan   1. Chronic diastolic heart failure  - some increase in weight, he has cut his torsemide down on his own to 20mg   bid due to cramps. We will try torsemide 40mg  in AM and 20mg  in PM x 3 days, then resume 20mg  bid. He will have BMET/Mg in 2 weeks.    2.  OSA - continue CPAP  3. Hyperlipidemia - continue current statin - we will repeat lipid panel  4. HTN -historically labile, recently has been well controlled - continue current meds.   F/u 2 months     Arnoldo Lenis, M.D., F.A.C.C.

## 2016-05-10 NOTE — Patient Instructions (Signed)
Your physician recommends that you schedule a follow-up appointment in: 2 MONTHS WITH DR. BRANCH   Your physician has recommended you make the following change in your medication:   INCREASE TORSEMIDE 40 MG IN THE MORNING AND 20 MG IN THE EVENING FOR THE NEXT 3 DAYS THEN TAKE 20 MG TWICE DAILY  Your physician recommends that you return for lab work 2 WEEKS BMP/LIPIDS/MG - PLEASE FAST FOR LABS  Thank you for choosing [facility]!!

## 2016-05-17 DIAGNOSIS — H401123 Primary open-angle glaucoma, left eye, severe stage: Secondary | ICD-10-CM | POA: Diagnosis not present

## 2016-05-17 DIAGNOSIS — H35033 Hypertensive retinopathy, bilateral: Secondary | ICD-10-CM | POA: Diagnosis not present

## 2016-05-17 DIAGNOSIS — Z961 Presence of intraocular lens: Secondary | ICD-10-CM | POA: Diagnosis not present

## 2016-05-17 DIAGNOSIS — H401133 Primary open-angle glaucoma, bilateral, severe stage: Secondary | ICD-10-CM | POA: Diagnosis not present

## 2016-05-25 DIAGNOSIS — E063 Autoimmune thyroiditis: Secondary | ICD-10-CM | POA: Diagnosis not present

## 2016-05-25 DIAGNOSIS — E119 Type 2 diabetes mellitus without complications: Secondary | ICD-10-CM | POA: Diagnosis not present

## 2016-05-25 DIAGNOSIS — R5383 Other fatigue: Secondary | ICD-10-CM | POA: Diagnosis not present

## 2016-05-25 DIAGNOSIS — Z1389 Encounter for screening for other disorder: Secondary | ICD-10-CM | POA: Diagnosis not present

## 2016-05-25 DIAGNOSIS — E669 Obesity, unspecified: Secondary | ICD-10-CM | POA: Diagnosis not present

## 2016-05-25 DIAGNOSIS — Z6841 Body Mass Index (BMI) 40.0 and over, adult: Secondary | ICD-10-CM | POA: Diagnosis not present

## 2016-05-26 DIAGNOSIS — Z1389 Encounter for screening for other disorder: Secondary | ICD-10-CM | POA: Diagnosis not present

## 2016-05-26 DIAGNOSIS — R5383 Other fatigue: Secondary | ICD-10-CM | POA: Diagnosis not present

## 2016-06-06 ENCOUNTER — Encounter: Payer: Self-pay | Admitting: Nurse Practitioner

## 2016-06-06 ENCOUNTER — Telehealth: Payer: Self-pay

## 2016-06-06 ENCOUNTER — Ambulatory Visit (INDEPENDENT_AMBULATORY_CARE_PROVIDER_SITE_OTHER): Payer: PPO | Admitting: Nurse Practitioner

## 2016-06-06 VITALS — BP 145/81 | HR 82 | Temp 97.7°F | Ht 66.0 in | Wt 270.4 lb

## 2016-06-06 DIAGNOSIS — R110 Nausea: Secondary | ICD-10-CM

## 2016-06-06 DIAGNOSIS — R1010 Upper abdominal pain, unspecified: Secondary | ICD-10-CM | POA: Diagnosis not present

## 2016-06-06 MED ORDER — ONDANSETRON HCL 4 MG PO TABS: 4.0000 mg | ORAL_TABLET | Freq: Three times a day (TID) | ORAL | 1 refills | Status: DC | PRN

## 2016-06-06 NOTE — Assessment & Plan Note (Signed)
The patient has a history of GERD, previously well controlled. He recently changed insurance companies and his medications were changed to "pill packs" that have essentially a mix of 3-5 pills per time period, with the only labeling listing all 3-5 pills rather than individually labeling, which required the patient to take all his pills at set times. Since taking his medications this way, he has had worsening abdominal pain and nausea. No other GI symptoms at this time. At this point I recommended calling insurance company in finding out if either he can be sent pills and pill bottles versus pill packs that have individually labeled pills so that he can take the medications to way he previously did which did not cause symptoms. I will have him return for follow-up in 6 weeks. If he calls back with continued symptoms we can consider Carafate to help coat his stomach and see if this helps.

## 2016-06-06 NOTE — Telephone Encounter (Signed)
Pt called office and said to let [facility] know that the Zofran was denied by his insurance company and he wants something else for nausea. Forwarding message to [facility].

## 2016-06-06 NOTE — Assessment & Plan Note (Signed)
Nausea as well as abdominal pain as per above. I will send an Zofran 4 mg to help with his nausea while this situation is straightened out. Return for follow-up in 6 weeks.

## 2016-06-06 NOTE — Patient Instructions (Signed)
1. Call your insurance company and find out if there is a way to either change or medications back to pill bottles or to pill packs with 1 pill per pack. 2. If they can do this, go back to taking her medications at the times he were previously taken him. 3. I sent and Zofran to your pharmacy to help with nausea. 4. If you continue to have symptoms after 1 or 2 weeks on Zofran, call us and we can send in a medication to help coach her stomach. 5. Return for follow-up in 6 weeks.

## 2016-06-06 NOTE — Progress Notes (Signed)
cc'ed to pcp °

## 2016-06-06 NOTE — Progress Notes (Signed)
Referring Provider: Redmond School, MD Primary Care Physician:  [name]., MD Primary GI:  Dr. Gala Romney  Chief Complaint  Patient presents with  . Nausea    worse after taking meds  . Abdominal Pain    mid upper  . Bloated    has gas    HPI:   [name] [name] is a 76 y.o. male who presents for abdominal pain and constipation. The patient was last seen in our office 01/22/2016 for GERD, constipation, nausea and vomiting, history of colon polyps. At that time it was noted nausea doing very well on Protonix daily, ginger root. Constipation doing well on Colace. 2-3 "pasty" BMs weekly, no other issues. Recommended continue Protonix, ginger root. Colace 1 or 2 tablets daily. Strive for continued weight loss, follow-up office visit in 6 months. BP management per primary care.  Today he states he was doing well. However, he changes insurance and they changed him to a "pill pack" style where he is taking multiple medications simultaneously. This has caused an increase in nausea and abdominal pain. He is insistent that his symptoms started when he medication were changed. Ginger root helps some, but not as much as it did previously, Abdominal pain is epigastric. No vomiting. Denies hematochezia or melena. Denies chest pain, dyspnea, dizziness, lightheadedness, syncope, near syncope. Denies any other upper or lower GI symptoms.  Past Medical History:  Diagnosis Date  . Bilateral lower leg cellulitis   . BPH (benign prostatic hyperplasia)   . Cataract    left  . CHF (congestive heart failure) ([place])   . Chronic diastolic HF (heart failure) ([place])   . Collagen vascular disease ([place])    history of venous insufficency and LE cellulitis  . Complication of anesthesia    2012 due to sleep apnea pt has had difficulty being put under  and waking up from anesthesia  . Deep venous insufficiency   . GERD (gastroesophageal reflux disease)   . Glaucoma    left  . Headache   . Heel spur    right heel  . History of bladder infections   . History of colonic polyps   . Hyperlipidemia   . Hypertension    Sees Dr. Debara Pickett  . Hypothyroidism    hypothyroid denies no rx  . Kidney stones    passed them  . Obesity   . Osteoarthritis   . PONV (postoperative nausea and vomiting)   . PUD (peptic ulcer disease)   . Ringing in ears   . S/P colonoscopy 2007, Feb and March 2012   hx of adenomas, left-sided diverticula, On 07/2010 TCS, fresh blood and clot noted coming from TI.  . S/P endoscopy 2007, 2012   2007: nl, May 2012: antral erosions  . SIRS (systemic inflammatory response syndrome) ([place]) 02/27/2012  . Sleep apnea    uses cpap  occ "unable to use lately due to nasal pain when use" - sleep study 2008 AHI 48.80/hr and 56.70hr during REM  . Thrombocytopenia ([place]) 12/30/2011   Stable    Past Surgical History:  Procedure Laterality Date  . BACK SURGERY    . CARDIAC CATHETERIZATION  02/27/2007   no sign of CAD, LVH, mod pulm HTN (Dr. Jackie Plum(  . CATARACT EXTRACTION W/ INTRAOCULAR LENS IMPLANT Right ~ 2012  . COLONOSCOPY N/A 05/28/2014   [id number] colonic polyps/colonic diverticulosis. Tubular adenomas. Next colonoscopy January 2021  . ESOPHAGOGASTRODUODENOSCOPY  Aug 2013   [facility], single 51mm pedunculated polyp otherwise normal. HYPERPLASTIC, NEGATIVE h.pylori  .  ESOPHAGOGASTRODUODENOSCOPY N/A 05/28/2014   RMR:non-critical Schatzki's ring/HH. Benign gastritis  . INGUINAL HERNIA REPAIR Right 1941  . KNEE ARTHROSCOPY Left ~ 2000  . LAPAROSCOPIC CHOLECYSTECTOMY    . [place] SURGERY  2012  . MALONEY DILATION N/A 05/28/2014   Procedure: Venia Minks DILATION;  Surgeon: Daneil Dolin, MD;  [facility];  Service: Endoscopy;  Laterality: N/A;  . NASAL SEPTOPLASTY W/ TURBINOPLASTY Bilateral 10/07/2015   Procedure: NASAL SEPTOPLASTY WITH TURBINATE REDUCTION;  Surgeon: Leta Baptist, MD;  Location: [facility];  Service: ENT;  Laterality: Bilateral;  . NASAL SEPTUM SURGERY Bilateral 10/07/2015      inferior turbinate resection  . NM MYOCAR PERF WALL MOTION  12/2006   dipyridamole myoview - stress images show mild perfusion defect in mid inferior walls with reversibility at rest; mild perfusion defect in mid inferolateral wall at stress with mild defect reversibility, EF 62%, abnormal but low risk study   . POSTERIOR LUMBAR FUSION  2015  . SAVORY DILATION N/A 05/28/2014   Procedure: SAVORY DILATION;  Surgeon: Daneil Dolin, MD;  Location: [facility];  Service: Endoscopy;  Laterality: N/A;  . SHOULDER OPEN ROTATOR CUFF REPAIR Left early 2000s  . small bowel capsule study  07/2011   ?transient focal ischemia in distal small bowel to explain GI bleeding  . TRANSTHORACIC ECHOCARDIOGRAM  09/2008   EF 60-65%, mod conc LVH  . TRANSURETHRAL RESECTION OF PROSTATE    . TRANSURETHRAL RESECTION OF PROSTATE N/A 06/18/2013   Procedure: TRANSURETHRAL RESECTION OF THE PROSTATE (TURP);  Surgeon: Marissa Nestle, MD;  Location: [facility];  Service: Urology;  Laterality: N/A;    Current Outpatient Prescriptions  Medication Sig Dispense Refill  . acetaminophen (TYLENOL) 325 MG tablet Take 650 mg by mouth every 6 (six) hours as needed.    Marland Kitchen amLODipine (NORVASC) 5 MG tablet Take 10 mg by mouth daily.     . bisacodyl (DULCOLAX) 5 MG EC tablet Take 1 tablet (5 mg total) by mouth daily. 30 tablet 0  . calcium citrate (CALCITRATE - DOSED IN MG ELEMENTAL CALCIUM) 950 MG tablet Take 200 mg of elemental calcium by mouth daily.    . Cholecalciferol (VITAMIN D3) 1000 units CAPS Take 1 capsule by mouth daily.     Marland Kitchen docusate sodium (COLACE) 100 MG capsule Take 200 mg by mouth daily as needed for mild constipation. Reported on 09/11/2015    . dorzolamide-timolol (COSOPT) 22.3-6.8 MG/ML ophthalmic solution Place 1 drop into both eyes 2 (two) times daily.    . ferrous sulfate 325 (65 FE) MG tablet Take 325 mg by mouth daily with breakfast.    . Ginger, Zingiber officinalis, (GINGER ROOT PO) Take 1 tablet by mouth 3  (three) times daily.     Marland Kitchen latanoprost (XALATAN) 0.005 % ophthalmic solution Place 1 drop into both eyes at bedtime.    . magnesium oxide (MAG-OX) 400 MG tablet Take 400 mg by mouth daily.    . metolazone (ZAROXOLYN) 2.5 MG tablet Take 1 tablet (2.5 mg total) by mouth every Wednesday. (Patient taking differently: Take 2.5 mg by mouth daily. ) 10 tablet 0  . metoprolol tartrate (LOPRESSOR) 25 MG tablet Take 1 tablet (25 mg total) by mouth daily. 90 tablet 3  . Oxycodone HCl 10 MG TABS Take 10 mg by mouth 4 (four) times daily as needed (pain).    . pantoprazole (PROTONIX) 40 MG tablet Take 40 mg by mouth 2 (two) times daily.    Vladimir Faster Glycol-Propyl  Glycol (LUBRICANT EYE DROPS) 0.4-0.3 % SOLN Apply 1 drop to eye daily as needed (for lubricant eye drops).    . potassium chloride SA (K-DUR,KLOR-CON) 20 MEQ tablet Take 1 tablet (20 mEq total) by mouth 2 (two) times daily. (Patient taking differently: Take 20 mEq by mouth 3 (three) times daily. ) 60 tablet 3  . tiZANidine (ZANAFLEX) 4 MG tablet Take 4 mg by mouth 2 (two) times daily.     . ondansetron (ZOFRAN) 4 MG tablet Take 1 tablet (4 mg total) by mouth every 8 (eight) hours as needed for nausea or vomiting. 30 tablet 1   No current facility-administered medications for this visit.     Allergies as of 06/06/2016 - Review Complete 06/06/2016  Allergen Reaction Noted  . Aspirin Other (See Comments)   . Nexium [esomeprazole magnesium] Other (See Comments) 11/14/2013    Family History  Problem Relation Age of Onset  . Colon cancer Father 3    deceased  . Prostate cancer Father   . Pancreatic cancer Mother 59    deceased  . Prostate cancer Brother   . Arthritis Son   . Arthritis Son   . Diabetes Mellitus II Son     Social History   Social History  . Marital status: Widowed    Spouse name: Roger Kill  . Number of children: 5  . Years of education: College   Occupational History  . disability Retired   Social History Main Topics    . Smoking status: Former Smoker    Years: 8.00    Types: Cigars    Start date: 11/20/1959    Quit date: 08/10/1973  . Smokeless tobacco: Never Used  . Alcohol use 0.0 oz/week     Comment: 10/07/2015 "drank a little bit; stopped in ~ 1975"  . Drug use: No  . Sexual activity: No   Other Topics Concern  . None   Social History Narrative   Patient lives at home with spouse and son.   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        Review of Systems: General: Negative for anorexia, weight loss, fever, chills, fatigue, weakness. ENT: Negative for hoarseness, difficulty swallowing. CV: Negative for chest pain, angina, palpitations, peripheral edema.  Respiratory: Negative for dyspnea at rest, cough, sputum, wheezing.  GI: See history of present illness. Endo: Negative for unusual weight change.  Heme: Negative for bruising or bleeding. Allergy: Negative for rash or hives.   Physical Exam: BP (!) 145/81   Pulse 82   Temp 97.7 F (36.5 C) (Oral)   Ht 5\' 6"  (1.676 m)   Wt 270 lb 6.4 oz (122.7 kg)   BMI 43.64 kg/m  General:   Morbidly obese male. Alert and oriented. Pleasant and cooperative. Well-nourished and well-developed.  Ears:  Normal auditory acuity. Cardiovascular:  S1, S2 present without murmurs appreciated. Extremities without clubbing or edema. Respiratory:  Clear to auscultation bilaterally. No wheezes, rales, or rhonchi. No distress.  Gastrointestinal:  +BS, rounded but soft, and non-distended. Mild epigastric TTP. No HSM noted. No guarding or rebound. No masses appreciated.  Rectal:  Deferred  Musculoskalatal:  Symmetrical without gross deformities. Neurologic:  Alert and oriented x4;  grossly normal neurologically. Psych:  Alert and cooperative. Normal mood and affect. Heme/Lymph/Immune: No excessive bruising noted.    06/06/2016 2:31 PM   Disclaimer: This note was dictated with voice recognition software. Similar sounding  words can inadvertently be transcribed and may not  be corrected upon review.

## 2016-06-07 DIAGNOSIS — I739 Peripheral vascular disease, unspecified: Secondary | ICD-10-CM | POA: Diagnosis not present

## 2016-06-10 NOTE — Telephone Encounter (Signed)
[name] [name]. Can we find out why Zofran was denied by insurance. Phenergan is high risk in a patient of this age. If they'll approve with an appeal letter, let me know.

## 2016-06-13 NOTE — Telephone Encounter (Signed)
I just got PA notice from the pharmacy. I will work on PA and let you know what the insurance says.

## 2016-06-14 NOTE — Telephone Encounter (Signed)
Noted, let me know if you need anything from me

## 2016-06-16 DIAGNOSIS — M1711 Unilateral primary osteoarthritis, right knee: Secondary | ICD-10-CM | POA: Diagnosis not present

## 2016-07-05 ENCOUNTER — Ambulatory Visit (INDEPENDENT_AMBULATORY_CARE_PROVIDER_SITE_OTHER): Payer: PPO | Admitting: Sports Medicine

## 2016-07-05 ENCOUNTER — Encounter: Payer: Self-pay | Admitting: Sports Medicine

## 2016-07-05 VITALS — BP 131/72 | Ht 67.0 in | Wt 272.0 lb

## 2016-07-05 DIAGNOSIS — M1711 Unilateral primary osteoarthritis, right knee: Secondary | ICD-10-CM

## 2016-07-05 MED ORDER — METHYLPREDNISOLONE ACETATE 40 MG/ML IJ SUSP
40.0000 mg | Freq: Once | INTRAMUSCULAR | Status: AC
  Administered 2016-07-05: 40 mg via INTRA_ARTICULAR

## 2016-07-05 NOTE — Progress Notes (Signed)
[name] [name] - 76 y.o. male MRN [id number]  Date of birth: [birth year]  SUBJECTIVE:   CC: R. Knee Pain  HPI:  [name] [name] is a new patient to our clinic. Patient presents with right knee pain. States he has known DJD of multiple joints. Has been having issues with knee for a while. Was getting steroid injections into right knee from previous sports medicine clinic. States that clinic closed down. He last received an injection over 6 months ago. States that the injections usually work well and will last for 6-8 months. Wants injection today. States the pain has worsened and he can hardly walk. Has started using cain again to assist with walking. Localizes pain to the medical aspect of knee.   ROS per HPI.    HISTORY: Past Medical, Surgical, Social, and Family History Reviewed & Updated per EMR.   Pertinent Historical Findings include: Past Medical History:  Diagnosis Date  . Bilateral lower leg cellulitis   . BPH (benign prostatic hyperplasia)   . Cataract    left  . CHF (congestive heart failure) ([place])   . Chronic diastolic HF (heart failure) ([place])   . Collagen vascular disease ([place])    history of venous insufficency and LE cellulitis  . Complication of anesthesia    2012 due to sleep apnea pt has had difficulty being put under  and waking up from anesthesia  . Deep venous insufficiency   . GERD (gastroesophageal reflux disease)   . Glaucoma    left  . Headache   . Heel spur    right heel  . History of bladder infections   . History of colonic polyps   . Hyperlipidemia   . Hypertension    Sees Dr. Debara Pickett  . Hypothyroidism    hypothyroid denies no rx  . Kidney stones    passed them  . Obesity   . Osteoarthritis   . PONV (postoperative nausea and vomiting)   . PUD (peptic ulcer disease)   . Ringing in ears   . S/P colonoscopy 2007, Feb and March 2012   hx of adenomas, left-sided diverticula, On 07/2010 TCS, fresh blood and clot noted coming from TI.  . S/P endoscopy  2007, 2012   2007: nl, May 2012: antral erosions  . SIRS (systemic inflammatory response syndrome) ([place]) 02/27/2012  . Sleep apnea    uses cpap  occ "unable to use lately due to nasal pain when use" - sleep study 2008 AHI 48.80/hr and 56.70hr during REM  . Thrombocytopenia ([place]) 12/30/2011   Stable   Current Outpatient Prescriptions on File Prior to Visit  Medication Sig Dispense Refill  . acetaminophen (TYLENOL) 325 MG tablet Take 650 mg by mouth every 6 (six) hours as needed.    Marland Kitchen amLODipine (NORVASC) 5 MG tablet Take 10 mg by mouth daily.     . bisacodyl (DULCOLAX) 5 MG EC tablet Take 1 tablet (5 mg total) by mouth daily. 30 tablet 0  . calcium citrate (CALCITRATE - DOSED IN MG ELEMENTAL CALCIUM) 950 MG tablet Take 200 mg of elemental calcium by mouth daily.    . Cholecalciferol (VITAMIN D3) 1000 units CAPS Take 1 capsule by mouth daily.     Marland Kitchen docusate sodium (COLACE) 100 MG capsule Take 200 mg by mouth daily as needed for mild constipation. Reported on 09/11/2015    . dorzolamide-timolol (COSOPT) 22.3-6.8 MG/ML ophthalmic solution Place 1 drop into both eyes 2 (two) times daily.    . ferrous sulfate 325 (65 FE)  MG tablet Take 325 mg by mouth daily with breakfast.    . Ginger, Zingiber officinalis, (GINGER ROOT PO) Take 1 tablet by mouth 3 (three) times daily.     Marland Kitchen latanoprost (XALATAN) 0.005 % ophthalmic solution Place 1 drop into both eyes at bedtime.    . magnesium oxide (MAG-OX) 400 MG tablet Take 400 mg by mouth daily.    . metolazone (ZAROXOLYN) 2.5 MG tablet Take 1 tablet (2.5 mg total) by mouth every Wednesday. (Patient taking differently: Take 2.5 mg by mouth daily. ) 10 tablet 0  . metoprolol tartrate (LOPRESSOR) 25 MG tablet Take 1 tablet (25 mg total) by mouth daily. 90 tablet 3  . ondansetron (ZOFRAN) 4 MG tablet Take 1 tablet (4 mg total) by mouth every 8 (eight) hours as needed for nausea or vomiting. 30 tablet 1  . Oxycodone HCl 10 MG TABS Take 10 mg by mouth 4 (four) times  daily as needed (pain).    . pantoprazole (PROTONIX) 40 MG tablet Take 40 mg by mouth 2 (two) times daily.    Vladimir Faster Glycol-Propyl Glycol (LUBRICANT EYE DROPS) 0.4-0.3 % SOLN Apply 1 drop to eye daily as needed (for lubricant eye drops).    . potassium chloride SA (K-DUR,KLOR-CON) 20 MEQ tablet Take 1 tablet (20 mEq total) by mouth 2 (two) times daily. (Patient taking differently: Take 20 mEq by mouth 3 (three) times daily. ) 60 tablet 3  . tiZANidine (ZANAFLEX) 4 MG tablet Take 4 mg by mouth 2 (two) times daily.      No current facility-administered medications on file prior to visit.     DATA REVIEWED: DG knee on left and 20/15. Show narrowing of medial joint compartment and spurring of the medial tibial plateau. Do not see any imaging for right knee.  PHYSICAL EXAM:  BP 131/72   Ht 5\' 7"  (1.702 m)   Wt 272 lb (123.4 kg)   BMI 42.60 kg/m   General: obese, NAD, cooperative Extremities: Trace edema. No cyanosis, clubbing.  Neurologic: No focal deficits, A&Ox3.  Skin: Intact without suspicious lesions or rashes. Warm and dry. Psych: Mood and affect are normal; no evidence of anxiety or depression.  R Knee: Inspection with no erythema, mild effusion, no obvious bony abnormalities. Palpation normal with no warmth, patellar tenderness, or condyle tenderness. Tenderness to palpation in medical compartment.  Limited ROM Ligaments with solid consistent endpoints including ACL, PCL, LCL, MCL. Non painful patellar compression. Normal tone and strength  Patellar glide with crepitus.  ASSESSMENT & PLAN:   1. Osteoarthritis of right knee, unspecified osteoarthritis type Most likely primary OA. No imaging to review.  -consider getting knee imaging at next visit -Injection performed (see procedure note below) -As needed return for further injections  Procedure Note: After informed written consent was obtained, patient was seated on exam table. Right knee was prepped with alcohol  swab x 3. Utilizing anterolateral approach, patient's left knee was injected intraarticularly with mixture of 1cc of depomedrol 1mg /mL and 3cc of 1% lidocaine without epinephrine. Patient tolerated the procedure well without immediate complications.  Luiz Blare, DO 07/05/2016, 10:25 AM PGY-3, [facility]  Patient seen and evaluated with the resident. I agree with the above plan of care. Patient presents today with known osteoarthritis of the right knee. He has done well with cortisone injections in the past. Last injection was 4-6 months ago. We repeated his injection today. He is not a good surgical candidate given his multiple medical comorbidities. If  he continues to get several months of relief from these injections then I would be happy to repeat them down the road. If his pain persists despite today's injection then I would start with getting updated x-rays of his right knee. He will follow-up with [name] as needed.

## 2016-07-07 ENCOUNTER — Encounter (HOSPITAL_COMMUNITY): Payer: Self-pay | Admitting: Oncology

## 2016-07-07 ENCOUNTER — Other Ambulatory Visit (HOSPITAL_COMMUNITY): Payer: Self-pay | Admitting: Oncology

## 2016-07-07 DIAGNOSIS — E611 Iron deficiency: Secondary | ICD-10-CM

## 2016-07-07 HISTORY — DX: Iron deficiency: E61.1

## 2016-07-11 ENCOUNTER — Encounter: Payer: Self-pay | Admitting: Cardiology

## 2016-07-11 ENCOUNTER — Telehealth (HOSPITAL_COMMUNITY): Payer: Self-pay

## 2016-07-11 ENCOUNTER — Encounter: Payer: Self-pay | Admitting: *Deleted

## 2016-07-11 ENCOUNTER — Ambulatory Visit (INDEPENDENT_AMBULATORY_CARE_PROVIDER_SITE_OTHER): Payer: PPO | Admitting: Cardiology

## 2016-07-11 VITALS — BP 145/85 | HR 84 | Ht 67.0 in | Wt 281.2 lb

## 2016-07-11 DIAGNOSIS — E785 Hyperlipidemia, unspecified: Secondary | ICD-10-CM

## 2016-07-11 DIAGNOSIS — I5032 Chronic diastolic (congestive) heart failure: Secondary | ICD-10-CM | POA: Diagnosis not present

## 2016-07-11 DIAGNOSIS — I10 Essential (primary) hypertension: Secondary | ICD-10-CM

## 2016-07-11 NOTE — Progress Notes (Signed)
Clinical Summary Mr. Baisch is a 76 y.o.male seen today for follow up of the following medical problems.   1. Chronic diastolic heart failure  - continued weight gain. Denies any LE edema.  - home weights around 275-278 lbs.  - increased DOE.  - he is unsure of his diuretic dosing, states he takes the pills that are in his pill pack provided by his pharmacy.    2. Hyperlipidemia - he is compliant with statin  3.HTN - home bp's 130s/80s  Past Medical History:  Diagnosis Date  . Bilateral lower leg cellulitis   . BPH (benign prostatic hyperplasia)   . Cataract    left  . CHF (congestive heart failure) ([place])   . Chronic diastolic HF (heart failure) ([place])   . Collagen vascular disease ([place])    history of venous insufficency and LE cellulitis  . Complication of anesthesia    2012 due to sleep apnea pt has had difficulty being put under  and waking up from anesthesia  . Deep venous insufficiency   . GERD (gastroesophageal reflux disease)   . Glaucoma    left  . Headache   . Heel spur    right heel  . History of bladder infections   . History of colonic polyps   . Hyperlipidemia   . Hypertension    Sees Dr. Debara Pickett  . Hypothyroidism    hypothyroid denies no rx  . Iron deficiency 07/07/2016  . Kidney stones    passed them  . Obesity   . Osteoarthritis   . PONV (postoperative nausea and vomiting)   . PUD (peptic ulcer disease)   . Ringing in ears   . S/P colonoscopy 2007, Feb and March 2012   hx of adenomas, left-sided diverticula, On 07/2010 TCS, fresh blood and clot noted coming from TI.  . S/P endoscopy 2007, 2012   2007: nl, May 2012: antral erosions  . SIRS (systemic inflammatory response syndrome) ([place]) 02/27/2012  . Sleep apnea    uses cpap  occ "unable to use lately due to nasal pain when use" - sleep study 2008 AHI 48.80/hr and 56.70hr during REM  . Thrombocytopenia ([place]) 12/30/2011   Stable     Allergies  Allergen Reactions  . Aspirin Other (See  Comments)    Nausea and upset stomach    . Nexium [Esomeprazole Magnesium] Other (See Comments)    Patient said it messed his stomach up     Current Outpatient Prescriptions  Medication Sig Dispense Refill  . acetaminophen (TYLENOL) 325 MG tablet Take 650 mg by mouth every 6 (six) hours as needed.    Marland Kitchen amLODipine (NORVASC) 5 MG tablet Take 10 mg by mouth daily.     . bisacodyl (DULCOLAX) 5 MG EC tablet Take 1 tablet (5 mg total) by mouth daily. 30 tablet 0  . brimonidine (ALPHAGAN) 0.2 % ophthalmic solution Place 1 drop into the right eye 2 times daily.    . calcium citrate (CALCITRATE - DOSED IN MG ELEMENTAL CALCIUM) 950 MG tablet Take 200 mg of elemental calcium by mouth daily.    . Cholecalciferol (VITAMIN D3) 1000 units CAPS Take 1 capsule by mouth daily.     Marland Kitchen docusate sodium (COLACE) 100 MG capsule Take 200 mg by mouth daily as needed for mild constipation. Reported on 09/11/2015    . dorzolamide-timolol (COSOPT) 22.3-6.8 MG/ML ophthalmic solution Place 1 drop into both eyes 2 (two) times daily.    . dorzolamide-timolol (COSOPT) 22.3-6.8 MG/ML  ophthalmic solution Place 1 drop into the left eye 2 times daily. OD 1 time a day    . ferrous sulfate 325 (65 FE) MG tablet Take 325 mg by mouth daily with breakfast.    . Ginger, Zingiber officinalis, (GINGER ROOT PO) Take 1 tablet by mouth 3 (three) times daily.     Marland Kitchen latanoprost (XALATAN) 0.005 % ophthalmic solution Place 1 drop into both eyes at bedtime.    . magnesium oxide (MAG-OX) 400 MG tablet Take 400 mg by mouth daily.    . metolazone (ZAROXOLYN) 2.5 MG tablet Take 1 tablet (2.5 mg total) by mouth every Wednesday. (Patient taking differently: Take 2.5 mg by mouth daily. ) 10 tablet 0  . metoprolol tartrate (LOPRESSOR) 25 MG tablet Take 1 tablet (25 mg total) by mouth daily. 90 tablet 3  . ondansetron (ZOFRAN) 4 MG tablet Take 1 tablet (4 mg total) by mouth every 8 (eight) hours as needed for nausea or vomiting. 30 tablet 1  . Oxycodone  HCl 10 MG TABS Take 10 mg by mouth 4 (four) times daily as needed (pain).    . pantoprazole (PROTONIX) 40 MG tablet Take 40 mg by mouth 2 (two) times daily.    Vladimir Faster Glycol-Propyl Glycol (LUBRICANT EYE DROPS) 0.4-0.3 % SOLN Apply 1 drop to eye daily as needed (for lubricant eye drops).    . potassium chloride SA (K-DUR,KLOR-CON) 20 MEQ tablet Take 1 tablet (20 mEq total) by mouth 2 (two) times daily. (Patient taking differently: Take 20 mEq by mouth 3 (three) times daily. ) 60 tablet 3  . tiZANidine (ZANAFLEX) 4 MG tablet Take 4 mg by mouth 2 (two) times daily.      No current facility-administered medications for this visit.      Past Surgical History:  Procedure Laterality Date  . BACK SURGERY    . CARDIAC CATHETERIZATION  02/27/2007   no sign of CAD, LVH, mod pulm HTN (Dr. Jackie Plum(  . CATARACT EXTRACTION W/ INTRAOCULAR LENS IMPLANT Right ~ 2012  . COLONOSCOPY N/A 05/28/2014   [id number] colonic polyps/colonic diverticulosis. Tubular adenomas. Next colonoscopy January 2021  . ESOPHAGOGASTRODUODENOSCOPY  Aug 2013   [facility], single 86mm pedunculated polyp otherwise normal. HYPERPLASTIC, NEGATIVE h.pylori  . ESOPHAGOGASTRODUODENOSCOPY N/A 05/28/2014   RMR:non-critical Schatzki's ring/HH. Benign gastritis  . INGUINAL HERNIA REPAIR Right 1941  . KNEE ARTHROSCOPY Left ~ 2000  . LAPAROSCOPIC CHOLECYSTECTOMY    . [place] SURGERY  2012  . MALONEY DILATION N/A 05/28/2014   Procedure: Venia Minks DILATION;  Surgeon: Daneil Dolin, MD;  [facility];  Service: Endoscopy;  Laterality: N/A;  . NASAL SEPTOPLASTY W/ TURBINOPLASTY Bilateral 10/07/2015   Procedure: NASAL SEPTOPLASTY WITH TURBINATE REDUCTION;  Surgeon: Leta Baptist, MD;  Location: [facility];  Service: ENT;  Laterality: Bilateral;  . NASAL SEPTUM SURGERY Bilateral 10/07/2015    inferior turbinate resection  . NM MYOCAR PERF WALL MOTION  12/2006   dipyridamole myoview - stress images show mild perfusion defect in mid inferior walls with  reversibility at rest; mild perfusion defect in mid inferolateral wall at stress with mild defect reversibility, EF 62%, abnormal but low risk study   . POSTERIOR LUMBAR FUSION  2015  . SAVORY DILATION N/A 05/28/2014   Procedure: SAVORY DILATION;  Surgeon: Daneil Dolin, MD;  [facility];  Service: Endoscopy;  Laterality: N/A;  . SHOULDER OPEN ROTATOR CUFF REPAIR Left early 2000s  . small bowel capsule study  07/2011   ?transient  focal ischemia in distal small bowel to explain GI bleeding  . TRANSTHORACIC ECHOCARDIOGRAM  09/2008   EF 60-65%, mod conc LVH  . TRANSURETHRAL RESECTION OF PROSTATE    . TRANSURETHRAL RESECTION OF PROSTATE N/A 06/18/2013   Procedure: TRANSURETHRAL RESECTION OF THE PROSTATE (TURP);  Surgeon: Marissa Nestle, MD;  Location: [facility];  Service: Urology;  Laterality: N/A;     Allergies  Allergen Reactions  . Aspirin Other (See Comments)    Nausea and upset stomach    . Nexium [Esomeprazole Magnesium] Other (See Comments)    Patient said it messed his stomach up      Family History  Problem Relation Age of Onset  . Colon cancer Father 81    deceased  . Prostate cancer Father   . Pancreatic cancer Mother 57    deceased  . Prostate cancer Brother   . Arthritis Son   . Arthritis Son   . Diabetes Mellitus II Son      Social History Mr. Mance reports that he quit smoking about 42 years ago. His smoking use included Cigars. He started smoking about 56 years ago. He quit after 8.00 years of use. He has never used smokeless tobacco. Mr. Mesner reports that he drinks alcohol.   Review of Systems CONSTITUTIONAL: No weight loss, fever, chills, weakness or fatigue.  HEENT: Eyes: No visual loss, blurred vision, double vision or yellow sclerae.No hearing loss, sneezing, congestion, runny nose or sore throat.  SKIN: No rash or itching.  CARDIOVASCULAR: no chest pain, no palpitations.  RESPIRATORY: No shortness of breath, cough or sputum.    GASTROINTESTINAL: No anorexia, nausea, vomiting or diarrhea. No abdominal pain or blood.  GENITOURINARY: No burning on urination, no polyuria NEUROLOGICAL: No headache, dizziness, syncope, paralysis, ataxia, numbness or tingling in the extremities. No change in bowel or bladder control.  MUSCULOSKELETAL: No muscle, back pain, joint pain or stiffness.  LYMPHATICS: No enlarged nodes. No history of splenectomy.  PSYCHIATRIC: No history of depression or anxiety.  ENDOCRINOLOGIC: No reports of sweating, cold or heat intolerance. No polyuria or polydipsia.  Marland Kitchen   Physical Examination Vitals:   07/11/16 1126  BP: (!) 145/85  Pulse: 84   Vitals:   07/11/16 1126  Weight: 281 lb 3.2 oz (127.6 kg)  Height: 5\' 7"  (1.702 m)    Gen: resting comfortably, no acute distress HEENT: no scleral icterus, pupils equal round and reactive, no palptable cervical adenopathy,  CV: RRR, no m/r/g, no jvd Resp: Clear to auscultation bilaterally GI: abdomen is soft, non-tender, non-distended, normal bowel sounds, no hepatosplenomegaly MSK: extremities are warm, no edema.  Skin: warm, no rash Neuro:  no focal deficits Psych: appropriate affect   Diagnostic Studies  04/2013 Echo Study Conclusions  - Procedure narrative: Transthoracic echocardiography. Technically difficult study with reduced echocardiographic windows. - Left ventricle: The cavity size was normal. There was moderate concentric hypertrophy. Systolic function was vigorous. The estimated ejection fraction was in the range of 65% to 70%. Wall motion was normal; there were no regional wall motion abnormalities. Doppler parameters are consistent with abnormal left ventricular relaxation (grade 1 diastolic dysfunction). The E/e' ratio is <10, suggesting normal LV filling pressure. - Left atrium: The atrium was normal in size. - Tricuspid valve: Mild regurgitation. - Pericardium, extracardiac: There was no  pericardial effusion.      Assessment and Plan   1. Chronic diastolic heart failure  - he is unclear how he is taking his diuretics. We will check with pharmacy.  -  significant weight gain over the last few days, will need to adjust dosing.    2. Hyperlipidemia - continue current statin   3. HTN -historically labile, recently has been well controlled      Arnoldo Lenis, M.D.

## 2016-07-11 NOTE — Telephone Encounter (Signed)
-----   Message from Baird Cancer, PA-C sent at 07/11/2016  2:23 PM EST ----- Nope.  Forget it  TK ----- Message ----- From: Darlina Guys, LPN Sent: [id number]  12:58 PM To: Baird Cancer, PA-C  Hey, what do you want to do? Thanks MB  Did you realize these labs are from 3 months ago?? Do you want to recheck before giving Iron. ------  Notes Recorded by Baird Cancer, PA-C on 07/07/2016 at 4:39 PM EST Please set patient up for 1 doses of IV Feraheme 510 mg.  Supportive therapy plan is built.

## 2016-07-11 NOTE — Telephone Encounter (Signed)
Left message notifying patient not to worry about coming for Iron or further labs. It will be rechecked at follow up appointment in March. Instructed to call cancer center if any questions.

## 2016-07-11 NOTE — Patient Instructions (Signed)
Your physician recommends that you schedule a follow-up appointment in: [place] DR. [place]  Your physician recommends that you continue on your current medications as directed. Please refer to the Current Medication list given to you today.  PLEASE CALL us TO PROVIDE YOUR PHARMACY INFORMATION SO THAT WE MAY VERIFY YOUR MEDICATIONS.  Thank you for choosing [facility]!!

## 2016-07-15 ENCOUNTER — Telehealth: Payer: Self-pay | Admitting: *Deleted

## 2016-07-15 MED ORDER — TORSEMIDE 20 MG PO TABS: 40.0000 mg | ORAL_TABLET | Freq: Two times a day (BID) | ORAL | Status: DC

## 2016-07-15 NOTE — Telephone Encounter (Signed)
Call placed to patient for clarification on correct doses of medication he should be on.  Reminded of OV coming up 08/08/2016.  Patient verbalized understanding.

## 2016-07-15 NOTE — Telephone Encounter (Signed)
-----   Message from Arnoldo Lenis, MD sent at 07/13/2016  3:51 PM EST ----- His torsemide should be 40mg  bid and metolazone 2.5mg  once a week on Wednesdays. I would not take metolazone daily, I am not sure how he started taking it this way.  J BrancH MD ----- Message ----- From: Massie Maroon, CMA Sent: 07/11/2016   1:50 PM To: Arnoldo Lenis, MD  Spoke with [facility] pharmacy confirmed pt taking metolazone 2.5 mg daily and torsemide 20 mg 2 tabs bid.   Staci

## 2016-07-21 ENCOUNTER — Ambulatory Visit (INDEPENDENT_AMBULATORY_CARE_PROVIDER_SITE_OTHER): Payer: PPO | Admitting: Nurse Practitioner

## 2016-07-21 ENCOUNTER — Encounter: Payer: Self-pay | Admitting: Nurse Practitioner

## 2016-07-21 VITALS — BP 144/85 | HR 72 | Temp 98.0°F | Ht 66.0 in | Wt 280.6 lb

## 2016-07-21 DIAGNOSIS — R110 Nausea: Secondary | ICD-10-CM | POA: Diagnosis not present

## 2016-07-21 DIAGNOSIS — R1010 Upper abdominal pain, unspecified: Secondary | ICD-10-CM

## 2016-07-21 NOTE — Patient Instructions (Signed)
1. Hold Protonix for now. 2. Start taking Dexilant once a day. We will give you samples last 10 days. 3. Call us with a report on how the symptoms are doing on the [place] in about 7-10 days. 4. Return for follow-up in 2 months.

## 2016-07-21 NOTE — Progress Notes (Signed)
CC'ED TO PCP 

## 2016-07-21 NOTE — Assessment & Plan Note (Signed)
Nausea without vomiting in addition to epigastric pain with a long-standing history of GERD who is been on Protonix for a number of years. Previous he tried and failed Nexium. At this point I'll have him hold Protonix, start Dexon once daily with samples for 10 days and a progress report in 7-10 days. Return for follow-up in 2 months. Call us for any worsening or severe symptoms.

## 2016-07-21 NOTE — Assessment & Plan Note (Signed)
Some improvement noted with medication timing change. Continued epigastric pain, although improved. Mild tenderness to palpation on exam today. Also with associated nausea. Has a long-standing history of GERD. He has been on Protonix for number of years, previously tried and failed Nexium. At this point I'll start him on Dexilant once a day, hold Protonix. We will provide samples for 10 days and request a progress report in 7-10 days. Return for follow-up in 2 months.

## 2016-07-21 NOTE — Progress Notes (Signed)
Referring Provider: Redmond School, MD Primary Care Physician:  [name]., MD Primary GI:  Dr. Gala Romney  Chief Complaint  Patient presents with  . Nausea  . Abdominal Pain    mid upper  . Gas    HPI:   [name] [name] is a 76 y.o. male who presents for follow-up on nausea, abdominal pain. The patient was last seen in our office 06/06/2016 for the same. At that time he noted he was doing well. However, his insurance and recently changed and he changed his medication set up to a "pill pack" style taking multiple medications simultaneously which caused increase in nausea and abdominal pain and he is insistent at that time that his symptoms started when his medications were changed. Abdominal pain is epigastric, no vomiting, no other red flag or warning signs/symptoms. Recommended contacting his insurance company to see if they can change his medication back to pill bottles or pill packs with 1 pill per pack. Zofran sent to the pharmacy. Recommended progress report in 1-2 weeks on Zofran and we can send in Carafate if needed. Return for follow-up in 6 weeks.  Today he states he's doing well overall. His medications have been changed to a different style to not take lots of medications at once. Is having a lot of gas. Also morning time nausea, improves with ginger root. Zofran made him worse. Still with some epigastric pain in addition to the nausea. Pain is dull, intermittent, pain worse with nausea. Denies esophageal burning/bitter taste. Deneis hematochezia or melena. Denies sudden changes in bowel habing, has daily bowel movements consistent with [place] 4. Denies chest pain, dyspnea, dizziness, lightheadedness, syncope, near syncope. Denies any other upper or lower GI symptoms. Overall he feels better than last visit.  Past Medical History:  Diagnosis Date  . Bilateral lower leg cellulitis   . BPH (benign prostatic hyperplasia)   . Cataract    left  . CHF (congestive heart failure)  ([place])   . Chronic diastolic HF (heart failure) ([place])   . Collagen vascular disease ([place])    history of venous insufficency and LE cellulitis  . Complication of anesthesia    2012 due to sleep apnea pt has had difficulty being put under  and waking up from anesthesia  . Deep venous insufficiency   . GERD (gastroesophageal reflux disease)   . Glaucoma    left  . Headache   . Heel spur    right heel  . History of bladder infections   . History of colonic polyps   . Hyperlipidemia   . Hypertension    Sees Dr. Debara Pickett  . Hypothyroidism    hypothyroid denies no rx  . Iron deficiency 07/07/2016  . Kidney stones    passed them  . Obesity   . Osteoarthritis   . PONV (postoperative nausea and vomiting)   . PUD (peptic ulcer disease)   . Ringing in ears   . S/P colonoscopy 2007, Feb and March 2012   hx of adenomas, left-sided diverticula, On 07/2010 TCS, fresh blood and clot noted coming from TI.  . S/P endoscopy 2007, 2012   2007: nl, May 2012: antral erosions  . SIRS (systemic inflammatory response syndrome) ([place]) 02/27/2012  . Sleep apnea    uses cpap  occ "unable to use lately due to nasal pain when use" - sleep study 2008 AHI 48.80/hr and 56.70hr during REM  . Thrombocytopenia ([place]) 12/30/2011   Stable    Past Surgical History:  Procedure Laterality  Date  . BACK SURGERY    . CARDIAC CATHETERIZATION  02/27/2007   no sign of CAD, LVH, mod pulm HTN (Dr. Jackie Plum(  . CATARACT EXTRACTION W/ INTRAOCULAR LENS IMPLANT Right ~ 2012  . COLONOSCOPY N/A 05/28/2014   [id number] colonic polyps/colonic diverticulosis. Tubular adenomas. Next colonoscopy January 2021  . ESOPHAGOGASTRODUODENOSCOPY  Aug 2013   [facility], single 7mm pedunculated polyp otherwise normal. HYPERPLASTIC, NEGATIVE h.pylori  . ESOPHAGOGASTRODUODENOSCOPY N/A 05/28/2014   RMR:non-critical Schatzki's ring/HH. Benign gastritis  . INGUINAL HERNIA REPAIR Right 1941  . KNEE ARTHROSCOPY Left ~ 2000  . LAPAROSCOPIC CHOLECYSTECTOMY    .  [place] SURGERY  2012  . MALONEY DILATION N/A 05/28/2014   Procedure: Venia Minks DILATION;  Surgeon: Daneil Dolin, MD;  [facility];  Service: Endoscopy;  Laterality: N/A;  . NASAL SEPTOPLASTY W/ TURBINOPLASTY Bilateral 10/07/2015   Procedure: NASAL SEPTOPLASTY WITH TURBINATE REDUCTION;  Surgeon: Leta Baptist, MD;  Location: [facility];  Service: ENT;  Laterality: Bilateral;  . NASAL SEPTUM SURGERY Bilateral 10/07/2015    inferior turbinate resection  . NM MYOCAR PERF WALL MOTION  12/2006   dipyridamole myoview - stress images show mild perfusion defect in mid inferior walls with reversibility at rest; mild perfusion defect in mid inferolateral wall at stress with mild defect reversibility, EF 62%, abnormal but low risk study   . POSTERIOR LUMBAR FUSION  2015  . SAVORY DILATION N/A 05/28/2014   Procedure: SAVORY DILATION;  Surgeon: Daneil Dolin, MD;  [facility];  Service: Endoscopy;  Laterality: N/A;  . SHOULDER OPEN ROTATOR CUFF REPAIR Left early 2000s  . small bowel capsule study  07/2011   ?transient focal ischemia in distal small bowel to explain GI bleeding  . TRANSTHORACIC ECHOCARDIOGRAM  09/2008   EF 60-65%, mod conc LVH  . TRANSURETHRAL RESECTION OF PROSTATE    . TRANSURETHRAL RESECTION OF PROSTATE N/A 06/18/2013   Procedure: TRANSURETHRAL RESECTION OF THE PROSTATE (TURP);  Surgeon: Marissa Nestle, MD;  Location: [facility];  Service: Urology;  Laterality: N/A;    Current Outpatient Prescriptions  Medication Sig Dispense Refill  . acetaminophen (TYLENOL) 325 MG tablet Take 650 mg by mouth every 6 (six) hours as needed.    Marland Kitchen amLODipine (NORVASC) 5 MG tablet Take 10 mg by mouth daily.     . bisacodyl (DULCOLAX) 5 MG EC tablet Take 1 tablet (5 mg total) by mouth daily. (Patient taking differently: Take 5 mg by mouth daily as needed. ) 30 tablet 0  . brimonidine (ALPHAGAN) 0.2 % ophthalmic solution Place 1 drop into the right eye 2 times daily.    . calcium citrate  (CALCITRATE - DOSED IN MG ELEMENTAL CALCIUM) 950 MG tablet Take 200 mg of elemental calcium by mouth. Takes 3 times per week    . Cholecalciferol (VITAMIN D3) 1000 units CAPS Take 1 capsule by mouth. Takes 3 times per week    . docusate sodium (COLACE) 100 MG capsule Take 200 mg by mouth daily as needed for mild constipation. Reported on 09/11/2015    . dorzolamide-timolol (COSOPT) 22.3-6.8 MG/ML ophthalmic solution Place 1 drop into both eyes 2 (two) times daily.    . ferrous sulfate 325 (65 FE) MG tablet Take 325 mg by mouth daily with breakfast.    . Ginger, Zingiber officinalis, (GINGER ROOT PO) Take 1 tablet by mouth as needed.     . latanoprost (XALATAN) 0.005 % ophthalmic solution Place 1 drop into both eyes at bedtime.    Marland Kitchen  magnesium oxide (MAG-OX) 400 MG tablet Take 400 mg by mouth. Takes 2 times per week    . metolazone (ZAROXOLYN) 2.5 MG tablet Take 1 tablet (2.5 mg total) by mouth every Wednesday. (Patient taking differently: Take 2.5 mg by mouth daily. ) 10 tablet 0  . metoprolol tartrate (LOPRESSOR) 25 MG tablet Take 1 tablet (25 mg total) by mouth daily. 90 tablet 3  . Oxycodone HCl 10 MG TABS Take 10 mg by mouth 4 (four) times daily as needed (pain).    . pantoprazole (PROTONIX) 40 MG tablet Take 40 mg by mouth 2 (two) times daily.    Vladimir Faster Glycol-Propyl Glycol (LUBRICANT EYE DROPS) 0.4-0.3 % SOLN Apply 1 drop to eye daily as needed (for lubricant eye drops).    . potassium chloride SA (K-DUR,KLOR-CON) 20 MEQ tablet Take 1 tablet (20 mEq total) by mouth 2 (two) times daily. (Patient taking differently: Take 20 mEq by mouth 4 (four) times daily. ) 60 tablet 3  . tiZANidine (ZANAFLEX) 4 MG tablet Take 4 mg by mouth 2 (two) times daily.     Marland Kitchen torsemide (DEMADEX) 20 MG tablet Take 2 tablets (40 mg total) by mouth 2 (two) times daily.     No current facility-administered medications for this visit.     Allergies as of 07/21/2016 - Review Complete 07/21/2016  Allergen Reaction  Noted  . Aspirin Other (See Comments)   . Nexium [esomeprazole magnesium] Other (See Comments) 11/14/2013    Family History  Problem Relation Age of Onset  . Colon cancer Father 74    deceased  . Prostate cancer Father   . Pancreatic cancer Mother 63    deceased  . Prostate cancer Brother   . Arthritis Son   . Arthritis Son   . Diabetes Mellitus II Son     Social History   Social History  . Marital status: Widowed    Spouse name: Roger Kill  . Number of children: 5  . Years of education: College   Occupational History  . disability Retired   Social History Main Topics  . Smoking status: Former Smoker    Years: 8.00    Types: Cigars    Start date: 11/20/1959    Quit date: 08/10/1973  . Smokeless tobacco: Never Used  . Alcohol use 0.0 oz/week     Comment: 10/07/2015 "drank a little bit; stopped in ~ 1975"  . Drug use: No  . Sexual activity: No   Other Topics Concern  . None   Social History Narrative   Patient lives at home with spouse and son.   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        Review of Systems: General: Negative for anorexia, weight loss, fever, chills, fatigue, weakness. ENT: Negative for hoarseness, difficulty swallowing. CV: Negative for chest pain, angina, palpitations, peripheral edema.  Respiratory: Negative for dyspnea at rest, cough, sputum, wheezing.  GI: See history of present illness. Endo: Negative for unusual weight change.  Heme: Negative for bruising or bleeding.   Physical Exam: BP (!) 144/85   Pulse 72   Temp 98 F (36.7 C) (Oral)   Ht 5\' 6"  (1.676 m)   Wt 280 lb 9.6 oz (127.3 kg)   BMI 45.29 kg/m  General:   Obese male. Alert and oriented. Pleasant and cooperative. Well-nourished and well-developed.  Eyes:  Without icterus, sclera clear and conjunctiva pink.  Ears:  Normal auditory acuity. Cardiovascular:  S1, S2 present  without murmurs appreciated. Extremities without clubbing or  edema. Respiratory:  Clear to auscultation bilaterally. No wheezes, rales, or rhonchi. No distress.  Gastrointestinal:  +BS, rounded but soft, and non-distended. Mild epigastric TTP. No HSM noted. No guarding or rebound. No masses appreciated.  Rectal:  Deferred  Musculoskalatal:  Symmetrical without gross deformities. Neurologic:  Alert and oriented x4;  grossly normal neurologically. Psych:  Alert and cooperative. Normal mood and affect. Heme/Lymph/Immune: No excessive bruising noted.    07/21/2016 11:15 AM   Disclaimer: This note was dictated with voice recognition software. Similar sounding words can inadvertently be transcribed and may not be corrected upon review.

## 2016-07-28 ENCOUNTER — Encounter (HOSPITAL_COMMUNITY): Payer: Self-pay | Admitting: Emergency Medicine

## 2016-07-28 ENCOUNTER — Emergency Department (HOSPITAL_COMMUNITY)
Admission: EM | Admit: 2016-07-28 | Discharge: 2016-07-28 | Disposition: A | Discharge status: Home / Self Care | Payer: PPO | Attending: Emergency Medicine | Admitting: Emergency Medicine

## 2016-07-28 DIAGNOSIS — Z79899 Other long term (current) drug therapy: Secondary | ICD-10-CM | POA: Insufficient documentation

## 2016-07-28 DIAGNOSIS — G8929 Other chronic pain: Secondary | ICD-10-CM | POA: Diagnosis not present

## 2016-07-28 DIAGNOSIS — I5032 Chronic diastolic (congestive) heart failure: Secondary | ICD-10-CM | POA: Insufficient documentation

## 2016-07-28 DIAGNOSIS — I2510 Atherosclerotic heart disease of native coronary artery without angina pectoris: Secondary | ICD-10-CM | POA: Insufficient documentation

## 2016-07-28 DIAGNOSIS — E039 Hypothyroidism, unspecified: Secondary | ICD-10-CM | POA: Insufficient documentation

## 2016-07-28 DIAGNOSIS — I110 Hypertensive heart disease with heart failure: Secondary | ICD-10-CM | POA: Diagnosis not present

## 2016-07-28 DIAGNOSIS — Z87891 Personal history of nicotine dependence: Secondary | ICD-10-CM | POA: Insufficient documentation

## 2016-07-28 DIAGNOSIS — M545 Low back pain: Secondary | ICD-10-CM | POA: Diagnosis not present

## 2016-07-28 DIAGNOSIS — M5441 Lumbago with sciatica, right side: Secondary | ICD-10-CM | POA: Insufficient documentation

## 2016-07-28 MED ORDER — HYDROMORPHONE HCL 1 MG/ML IJ SOLN
1.0000 mg | Freq: Once | INTRAMUSCULAR | Status: AC
  Administered 2016-07-28: 1 mg via INTRAMUSCULAR
  Filled 2016-07-28: qty 1

## 2016-07-28 MED ORDER — HYDROMORPHONE HCL 4 MG PO TABS: 4.0000 mg | ORAL_TABLET | Freq: Four times a day (QID) | ORAL | 0 refills | Status: DC | PRN

## 2016-07-28 MED ORDER — PREDNISONE 10 MG (21) PO TBPK: ORAL_TABLET | Freq: Every day | ORAL | 0 refills | Status: DC

## 2016-07-28 NOTE — ED Provider Notes (Signed)
Medical screening examination/treatment/procedure(s) were conducted as a shared visit with non-physician practitioner(s) and myself.  I personally evaluated the patient during the encounter.   EKG Interpretation None      Patient seen by me along with physician assistant. Patient with a long-standing history of back problems. Had been followed by  is this will pain management clinic in the past here in [place]. Which is no longer in existence. Patient over the last 4-5 days is been having some increased back pain that radiates more so on the right side. No injuries or falls. Recommending treatment with prednisone patient to continue his oxycodone and also to add on hydromorphone orally. Follow back up with primary care doctor for consideration for referral to pain management again.    Fredia Sorrow, MD 07/28/16 1340

## 2016-07-28 NOTE — ED Triage Notes (Signed)
PT c/o lower back pain radiates down his right leg worsening x4 days. PT states has chronic back pain and pain has been unrelieved by home oxycodone and muscle relaxer's. PT able to stand out of w/c and ambulate to bed and denies any bowel or bladder issues.

## 2016-07-28 NOTE — Discharge Instructions (Signed)
Medications: Dilaudid, prednisone  Treatment: Supplement your oxycodone with Dilaudid every 6-8 hours as needed for severe pain. Take prednisone as prescribed. Make sure to finish out this medication. Use heat to your back 3-4 times daily alternating 20 minutes on, 20 minutes off.  Follow-up: Please follow-up with your primary care provider as soon as possible for further evaluation and treatment of your pain. Please return to emergency department if he develop any new or worsening symptoms.

## 2016-07-28 NOTE — ED Provider Notes (Signed)
[facility] Provider Note   CSN: [id number] Arrival date & time: 07/28/16  1007     History   Chief Complaint Chief Complaint  Patient presents with  . Back Pain    HPI [name] [name] is a 76 y.o. male with history of chronic back pain who presents with worsening low back pain pain over the past 4 days despite at home pain medication and muscle relaxers. Patient reports midline and right sided back pain with radiation to his right leg when he walks. Patient states he has also had intermittently and shooting pain to his groin. He denies any numbness or tingling or saddle anesthesia in his groin however. He denies any bowel or bladder incontinence. Patient has been taking oxycodone and tizanidine at home without relief. Patient denies any fevers, recent procedures to his back, chest pain, shortness of breath, abdominal pain, nausea or vomiting, urinary symptoms. No known recent injury or trauma. Patient has had 2 back surgeries in the past, most recently 2015. Patient has not had any injections to his back in 6-8 months. Patient is followed by Dr. Ace Gins.  HPI  Past Medical History:  Diagnosis Date  . Bilateral lower leg cellulitis   . BPH (benign prostatic hyperplasia)   . Cataract    left  . CHF (congestive heart failure) ([place])   . Chronic diastolic HF (heart failure) ([place])   . Collagen vascular disease ([place])    history of venous insufficency and LE cellulitis  . Complication of anesthesia    2012 due to sleep apnea pt has had difficulty being put under  and waking up from anesthesia  . Deep venous insufficiency   . GERD (gastroesophageal reflux disease)   . Glaucoma    left  . Headache   . Heel spur    right heel  . History of bladder infections   . History of colonic polyps   . Hyperlipidemia   . Hypertension    Sees Dr. Debara Pickett  . Hypothyroidism    hypothyroid denies no rx  . Iron deficiency 07/07/2016  . Kidney stones    passed them  . Obesity   .  Osteoarthritis   . PONV (postoperative nausea and vomiting)   . PUD (peptic ulcer disease)   . Ringing in ears   . S/P colonoscopy 2007, Feb and March 2012   hx of adenomas, left-sided diverticula, On 07/2010 TCS, fresh blood and clot noted coming from TI.  . S/P endoscopy 2007, 2012   2007: nl, May 2012: antral erosions  . SIRS (systemic inflammatory response syndrome) ([place]) 02/27/2012  . Sleep apnea    uses cpap  occ "unable to use lately due to nasal pain when use" - sleep study 2008 AHI 48.80/hr and 56.70hr during REM  . Thrombocytopenia ([place]) 12/30/2011   Stable    Patient Active Problem List   Diagnosis Date Noted  . Iron deficiency 07/07/2016  . Acute on chronic diastolic congestive heart failure ([place]) 03/23/2016  . Back pain 03/22/2016  . Acute right-sided low back pain with right-sided sciatica   . S/P nasal septoplasty 10/07/2015  . Parotitis 03/06/2015  . Chronic diastolic CHF (congestive heart failure) ([place]) 03/06/2015  . Parotitis, acute 03/06/2015  . Injection site irritation 03/06/2015  . Nausea without vomiting 12/08/2014  . Dysphagia, pharyngoesophageal phase   . History of colonic polyps   . Hypokalemia 09/01/2013  . Dehydration 09/01/2013  . Chest pain 09/01/2013  . Leukocytosis 09/01/2013  . Right-sided heart failure 08/20/2013  .  Acute on chronic diastolic heart failure ([place]) 08/20/2013  . Acute on chronic diastolic CHF 29/93/7169  . Diastolic dysfunction by echo Dec 2014 07/30/2013  . Morbid obesity- BMI 44.5 07/30/2013  . Edema of both legs 07/30/2013  . Bilateral lower leg cellulitis 07/18/2013  . Pre-operative cardiovascular examination 06/12/2013  . 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 Right heart failure 11/27/2012  . OSA (obstructive sleep apnea) 11/27/2012  . Other spondylosis with radiculopathy, lumbar region 08/22/2012  . Bright red blood per rectum 06/15/2012  . Constipation 02/29/2012  . Edema 02/29/2012  . SIRS (systemic  inflammatory response syndrome) ([place]) 02/27/2012  . UTI (lower urinary tract infection) 02/27/2012  . BPH (benign prostatic hyperplasia) 02/27/2012  . Obesity 02/27/2012  . Hyperlipidemia 02/27/2012  . Thrombocytopenia ([place]) 12/30/2011  . Hypothyroidism 10/27/2011  . Upper abdominal pain 10/27/2011  . Anorexia 10/27/2011  . Bowel habit changes 10/27/2011  . Constipation 04/20/2011  . Rectal bleeding 11/16/2010  . NAUSEA 06/08/2010  . DIARRHEA 06/08/2010  . COLONIC POLYPS, ADENOMATOUS, HX OF 07/16/2009  . Essential hypertension 06/19/2009  . GASTROESOPHAGEAL REFLUX DISEASE, CHRONIC 06/19/2009  . FATTY LIVER DISEASE 06/19/2009  . CHOLELITHIASIS, SYMPTOMATIC 06/19/2009  . RENAL INSUFFICIENCY 06/19/2009  . ABDOMINAL BLOATING 06/19/2009    Past Surgical History:  Procedure Laterality Date  . BACK SURGERY    . CARDIAC CATHETERIZATION  02/27/2007   no sign of CAD, LVH, mod pulm HTN (Dr. Jackie Plum(  . CATARACT EXTRACTION W/ INTRAOCULAR LENS IMPLANT Right ~ 2012  . COLONOSCOPY N/A 05/28/2014   CVE:LFYBOFBP colonic polyps/colonic diverticulosis. Tubular adenomas. Next colonoscopy January 2021  . ESOPHAGOGASTRODUODENOSCOPY  Aug 2013   [facility], single 72mm pedunculated polyp otherwise normal. HYPERPLASTIC, NEGATIVE h.pylori  . ESOPHAGOGASTRODUODENOSCOPY N/A 05/28/2014   RMR:non-critical Schatzki's ring/HH. Benign gastritis  . INGUINAL HERNIA REPAIR Right 1941  . KNEE ARTHROSCOPY Left ~ 2000  . LAPAROSCOPIC CHOLECYSTECTOMY    . [place] SURGERY  2012  . MALONEY DILATION N/A 05/28/2014   Procedure: Venia Minks DILATION;  Surgeon: Daneil Dolin, MD;  [facility];  Service: Endoscopy;  Laterality: N/A;  . NASAL SEPTOPLASTY W/ TURBINOPLASTY Bilateral 10/07/2015   Procedure: NASAL SEPTOPLASTY WITH TURBINATE REDUCTION;  Surgeon: Leta Baptist, MD;  Location: [facility];  Service: ENT;  Laterality: Bilateral;  . NASAL SEPTUM SURGERY Bilateral 10/07/2015    inferior turbinate resection  . NM MYOCAR PERF  WALL MOTION  12/2006   dipyridamole myoview - stress images show mild perfusion defect in mid inferior walls with reversibility at rest; mild perfusion defect in mid inferolateral wall at stress with mild defect reversibility, EF 62%, abnormal but low risk study   . POSTERIOR LUMBAR FUSION  2015  . SAVORY DILATION N/A 05/28/2014   Procedure: SAVORY DILATION;  Surgeon: Daneil Dolin, MD;  Location: [facility];  Service: Endoscopy;  Laterality: N/A;  . SHOULDER OPEN ROTATOR CUFF REPAIR Left early 2000s  . small bowel capsule study  07/2011   ?transient focal ischemia in distal small bowel to explain GI bleeding  . TRANSTHORACIC ECHOCARDIOGRAM  09/2008   EF 60-65%, mod conc LVH  . TRANSURETHRAL RESECTION OF PROSTATE    . TRANSURETHRAL RESECTION OF PROSTATE N/A 06/18/2013   Procedure: TRANSURETHRAL RESECTION OF THE PROSTATE (TURP);  Surgeon: Marissa Nestle, MD;  Location: [facility];  Service: Urology;  Laterality: N/A;       Home Medications    Prior to Admission medications   Medication Sig Start Date End Date  Taking? Authorizing Provider  acetaminophen (TYLENOL) 325 MG tablet Take 650 mg by mouth every 6 (six) hours as needed.   Yes Historical Provider, MD  amLODipine (NORVASC) 5 MG tablet Take 10 mg by mouth daily.    Yes Historical Provider, MD  brimonidine (ALPHAGAN) 0.2 % ophthalmic solution Place 1 drop into the right eye 2 times daily. 04/11/16 04/11/17 Yes Historical Provider, MD  calcium citrate (CALCITRATE - DOSED IN MG ELEMENTAL CALCIUM) 950 MG tablet Take 200 mg of elemental calcium by mouth daily. Takes 3 times per week   Yes Historical Provider, MD  Cholecalciferol (VITAMIN D3) 1000 units CAPS Take 1 capsule by mouth. Takes 3 times per week   Yes Historical Provider, MD  docusate sodium (COLACE) 100 MG capsule Take 200 mg by mouth daily as needed for mild constipation. Reported on 09/11/2015   Yes Historical Provider, MD  dorzolamide-timolol (COSOPT) 22.3-6.8 MG/ML ophthalmic  solution Place 1 drop into both eyes 2 (two) times daily. 12/17/14  Yes Historical Provider, MD  ferrous sulfate 325 (65 FE) MG tablet Take 325 mg by mouth daily with breakfast.   Yes Historical Provider, MD  Ginger, Zingiber officinalis, (GINGER ROOT PO) Take 1 tablet by mouth as needed.    Yes Historical Provider, MD  latanoprost (XALATAN) 0.005 % ophthalmic solution Place 1 drop into both eyes at bedtime.   Yes Historical Provider, MD  magnesium oxide (MAG-OX) 400 MG tablet Take 400 mg by mouth daily. Takes 2 times per week   Yes Historical Provider, MD  metolazone (ZAROXOLYN) 2.5 MG tablet Take 1 tablet (2.5 mg total) by mouth every Wednesday. 05/11/16  Yes Arnoldo Lenis, MD  metoprolol tartrate (LOPRESSOR) 25 MG tablet Take 1 tablet (25 mg total) by mouth daily. 04/08/16  Yes Lendon Colonel, NP  Oxycodone HCl 10 MG TABS Take 10 mg by mouth 2 (two) times daily as needed (pain).    Yes Historical Provider, MD  pantoprazole (PROTONIX) 40 MG tablet Take 40 mg by mouth 2 (two) times daily.   Yes Historical Provider, MD  potassium chloride SA (K-DUR,KLOR-CON) 20 MEQ tablet Take 1 tablet (20 mEq total) by mouth 2 (two) times daily. Patient taking differently: Take 20 mEq by mouth 4 (four) times daily.  04/08/16  Yes Lendon Colonel, NP  tiZANidine (ZANAFLEX) 4 MG tablet Take 4 mg by mouth 2 (two) times daily.    Yes Historical Provider, MD  torsemide (DEMADEX) 20 MG tablet Take 2 tablets (40 mg total) by mouth 2 (two) times daily. 07/15/16  Yes Arnoldo Lenis, MD  bisacodyl (DULCOLAX) 5 MG EC tablet Take 1 tablet (5 mg total) by mouth daily. Patient not taking: Reported on 07/28/2016 03/26/16   Thurnell Lose, MD  HYDROmorphone (DILAUDID) 4 MG tablet Take 1 tablet (4 mg total) by mouth every 6 (six) hours as needed for severe pain. 07/28/16   Frederica Kuster, PA-C  Polyethyl Glycol-Propyl Glycol (LUBRICANT EYE DROPS) 0.4-0.3 % SOLN Apply 1 drop to eye daily as needed (for lubricant eye drops).     Historical Provider, MD  predniSONE (STERAPRED UNI-PAK 21 TAB) 10 MG (21) TBPK tablet Take by mouth daily. Take 6 tabs by mouth daily  for 2 days, then 5 tabs for 2 days, then 4 tabs for 2 days, then 3 tabs for 2 days, 2 tabs for 2 days, then 1 tab by mouth daily for 2 days 07/28/16   Frederica Kuster, PA-C    Family History Family  History  Problem Relation Age of Onset  . Colon cancer Father 38    deceased  . Prostate cancer Father   . Pancreatic cancer Mother 64    deceased  . Prostate cancer Brother   . Arthritis Son   . Arthritis Son   . Diabetes Mellitus II Son     Social History Social History  Substance Use Topics  . Smoking status: Former Smoker    Years: 8.00    Types: Cigars    Start date: 11/20/1959    Quit date: 08/10/1973  . Smokeless tobacco: Never Used  . Alcohol use 0.0 oz/week     Comment: 10/07/2015 "drank a little bit; stopped in ~ 1975"     Allergies   Aspirin and Nexium [esomeprazole magnesium]   Review of Systems Review of Systems  Constitutional: Negative for chills and fever.  HENT: Negative for facial swelling and sore throat.   Respiratory: Negative for shortness of breath.   Cardiovascular: Negative for chest pain.  Gastrointestinal: Negative for abdominal pain, nausea and vomiting.  Genitourinary: Negative for dysuria.  Musculoskeletal: Positive for back pain.  Skin: Negative for rash and wound.  Neurological: Negative for numbness and headaches.  Psychiatric/Behavioral: The patient is not nervous/anxious.      Physical Exam Updated Vital Signs BP 116/76   Pulse 87   Temp 98.2 F (36.8 C) (Oral)   Resp 18   Ht 5\' 6"  (1.676 m)   Wt 127 kg   SpO2 94%   BMI 45.19 kg/m   Physical Exam  Constitutional: He appears well-developed and well-nourished. No distress.  HENT:  Head: Normocephalic and atraumatic.  Mouth/Throat: Oropharynx is clear and moist. No oropharyngeal exudate.  Eyes: Conjunctivae are normal. Pupils are equal,  round, and reactive to light. Right eye exhibits no discharge. Left eye exhibits no discharge. No scleral icterus.  Neck: Normal range of motion. Neck supple. No thyromegaly present.  Cardiovascular: Normal rate, regular rhythm, normal heart sounds and intact distal pulses.  Exam reveals no gallop and no friction rub.   No murmur heard. Pulmonary/Chest: Effort normal and breath sounds normal. No stridor. No respiratory distress. He has no wheezes. He has no rales.  Abdominal: Soft. Bowel sounds are normal. He exhibits no distension. There is no tenderness. There is no rebound and no guarding.  Musculoskeletal: He exhibits no edema.       Lumbar back: He exhibits tenderness and bony tenderness.       Back:  Lymphadenopathy:    He has no cervical adenopathy.  Neurological: He is alert. Coordination normal.  Decreased sensation to R leg, however present; 4/5 strength with R hip extension; otherwise 5/5 strength to lower extremities  Skin: Skin is warm and dry. No rash noted. He is not diaphoretic. No pallor.  Psychiatric: He has a normal mood and affect.  Nursing note and vitals reviewed.    ED Treatments / Results  Labs (all labs ordered are listed, but only abnormal results are displayed) Labs Reviewed - No data to display  EKG  EKG Interpretation None       Radiology No results found.  Procedures Procedures (including critical care time)  Medications Ordered in ED Medications  HYDROmorphone (DILAUDID) injection 1 mg (1 mg Intramuscular Given 07/28/16 1130)     Initial Impression / Assessment and Plan / ED Course  I have reviewed the triage vital signs and the nursing notes.  Pertinent labs & imaging results that were available during my care of  the patient were reviewed by me and considered in my medical decision making (see chart for details).     Patient with back pain.  No neurological deficits and normal neuro exam.  Patient is ambulatory.  No loss of bowel or  bladder control.  No concern for cauda equina.  No fever, night sweats, weight loss, h/o cancer, IVDA, no recent procedure to back. No urinary symptoms suggestive of UTI.  Supportive care and return precaution discussed. Patient given PO Dilaudid to supplement his Percocet per recommendation of Dr. Rogene Houston who also evaluated the patient. Follow-up to PCP. Return precautions given. Patient understands and agrees with plan. Patient vitals stable throughout ED course discharged in satisfactory condition.   Final Clinical Impressions(s) / ED Diagnoses   Final diagnoses:  Chronic right-sided low back pain with right-sided sciatica    New Prescriptions Discharge Medication List as of 07/28/2016  2:11 PM    START taking these medications   Details  HYDROmorphone (DILAUDID) 4 MG tablet Take 1 tablet (4 mg total) by mouth every 6 (six) hours as needed for severe pain., Starting Thu 07/28/2016, Print    predniSONE (STERAPRED UNI-PAK 21 TAB) 10 MG (21) TBPK tablet Take by mouth daily. Take 6 tabs by mouth daily  for 2 days, then 5 tabs for 2 days, then 4 tabs for 2 days, then 3 tabs for 2 days, 2 tabs for 2 days, then 1 tab by mouth daily for 2 days, Starting Thu 07/28/2016, Print         Frederica Kuster, PA-C 07/30/16 0104    Fredia Sorrow, MD 07/30/16 [phone]

## 2016-07-28 NOTE — ED Notes (Signed)
Patient given crackers and diet drink.

## 2016-07-29 DIAGNOSIS — E1129 Type 2 diabetes mellitus with other diabetic kidney complication: Secondary | ICD-10-CM | POA: Diagnosis not present

## 2016-07-29 DIAGNOSIS — I5032 Chronic diastolic (congestive) heart failure: Secondary | ICD-10-CM | POA: Diagnosis not present

## 2016-07-29 DIAGNOSIS — R201 Hypoesthesia of skin: Secondary | ICD-10-CM | POA: Diagnosis not present

## 2016-07-29 DIAGNOSIS — Z6841 Body Mass Index (BMI) 40.0 and over, adult: Secondary | ICD-10-CM | POA: Diagnosis not present

## 2016-07-29 DIAGNOSIS — G894 Chronic pain syndrome: Secondary | ICD-10-CM | POA: Diagnosis not present

## 2016-07-29 DIAGNOSIS — E669 Obesity, unspecified: Secondary | ICD-10-CM | POA: Diagnosis not present

## 2016-07-29 DIAGNOSIS — F330 Major depressive disorder, recurrent, mild: Secondary | ICD-10-CM | POA: Diagnosis not present

## 2016-07-29 DIAGNOSIS — E1140 Type 2 diabetes mellitus with diabetic neuropathy, unspecified: Secondary | ICD-10-CM | POA: Diagnosis not present

## 2016-08-02 ENCOUNTER — Telehealth: Payer: Self-pay | Admitting: Internal Medicine

## 2016-08-02 MED ORDER — DEXLANSOPRAZOLE 60 MG PO CPDR: 60.0000 mg | DELAYED_RELEASE_CAPSULE | Freq: Every day | ORAL | 3 refills | Status: DC

## 2016-08-02 NOTE — Telephone Encounter (Signed)
Completed.

## 2016-08-02 NOTE — Telephone Encounter (Signed)
Pt has finished taking his dexilant samples and they are working real good for him and he needs a prescription called into [facility].

## 2016-08-02 NOTE — Addendum Note (Signed)
Addended by: Annitta Needs on: 08/02/2016 02:21 PM   Modules accepted: Orders

## 2016-08-02 NOTE — Telephone Encounter (Signed)
Routing to the refill box. 

## 2016-08-03 ENCOUNTER — Telehealth: Payer: Self-pay | Admitting: Internal Medicine

## 2016-08-03 ENCOUNTER — Telehealth: Payer: Self-pay | Admitting: General Practice

## 2016-08-03 NOTE — Telephone Encounter (Signed)
[phone]    PATIENT CAME INTO OFFICE INQUIRING ABOUT THE LAST SAMPLE HE WAS GIVEN BY ERIC, WANTS IT CALLED INTO HIS PHARMACY

## 2016-08-03 NOTE — Telephone Encounter (Signed)
I called and LMOM for pt that we sent the prescription to the pharmacy yesterday for his Dexilant, and to call if he has problems.

## 2016-08-03 NOTE — Telephone Encounter (Signed)
Patient called in and stated that he was told by [place] that his insurance will not cover [place].  I spoke with Dawn in the pharmacy and she said she will send over the PA for [place].  I made the patient aware that a PA will need to be done and he voiced understanding.  Routing to [employer]

## 2016-08-04 ENCOUNTER — Telehealth: Payer: Self-pay | Admitting: Internal Medicine

## 2016-08-04 NOTE — Telephone Encounter (Signed)
Pt asked if he could get samples of Dexilant while he is waiting on the PA on his refill. Please advise. [phone]

## 2016-08-04 NOTE — Telephone Encounter (Signed)
I am leaving 3 boxes of Dexilant at the front for pt until he gets PA. Pt is aware.

## 2016-08-04 NOTE — Telephone Encounter (Signed)
Noted. No further recommendations.  

## 2016-08-05 DIAGNOSIS — M6281 Muscle weakness (generalized): Secondary | ICD-10-CM | POA: Diagnosis not present

## 2016-08-05 DIAGNOSIS — I872 Venous insufficiency (chronic) (peripheral): Secondary | ICD-10-CM | POA: Diagnosis not present

## 2016-08-05 DIAGNOSIS — I509 Heart failure, unspecified: Secondary | ICD-10-CM | POA: Diagnosis not present

## 2016-08-05 DIAGNOSIS — E1159 Type 2 diabetes mellitus with other circulatory complications: Secondary | ICD-10-CM | POA: Diagnosis not present

## 2016-08-05 DIAGNOSIS — I110 Hypertensive heart disease with heart failure: Secondary | ICD-10-CM | POA: Diagnosis not present

## 2016-08-05 DIAGNOSIS — E063 Autoimmune thyroiditis: Secondary | ICD-10-CM | POA: Diagnosis not present

## 2016-08-05 DIAGNOSIS — G894 Chronic pain syndrome: Secondary | ICD-10-CM | POA: Diagnosis not present

## 2016-08-05 DIAGNOSIS — F330 Major depressive disorder, recurrent, mild: Secondary | ICD-10-CM | POA: Diagnosis not present

## 2016-08-05 DIAGNOSIS — R262 Difficulty in walking, not elsewhere classified: Secondary | ICD-10-CM | POA: Diagnosis not present

## 2016-08-05 DIAGNOSIS — M545 Low back pain: Secondary | ICD-10-CM | POA: Diagnosis not present

## 2016-08-08 ENCOUNTER — Ambulatory Visit (INDEPENDENT_AMBULATORY_CARE_PROVIDER_SITE_OTHER): Payer: PPO | Admitting: Cardiology

## 2016-08-08 ENCOUNTER — Encounter: Payer: Self-pay | Admitting: Cardiology

## 2016-08-08 VITALS — BP 103/70 | HR 73 | Ht 67.0 in | Wt 275.6 lb

## 2016-08-08 DIAGNOSIS — I5032 Chronic diastolic (congestive) heart failure: Secondary | ICD-10-CM | POA: Diagnosis not present

## 2016-08-08 DIAGNOSIS — H401123 Primary open-angle glaucoma, left eye, severe stage: Secondary | ICD-10-CM | POA: Diagnosis not present

## 2016-08-08 DIAGNOSIS — E782 Mixed hyperlipidemia: Secondary | ICD-10-CM | POA: Diagnosis not present

## 2016-08-08 DIAGNOSIS — R5383 Other fatigue: Secondary | ICD-10-CM

## 2016-08-08 DIAGNOSIS — G4730 Sleep apnea, unspecified: Secondary | ICD-10-CM

## 2016-08-08 DIAGNOSIS — I10 Essential (primary) hypertension: Secondary | ICD-10-CM

## 2016-08-08 DIAGNOSIS — G4733 Obstructive sleep apnea (adult) (pediatric): Secondary | ICD-10-CM

## 2016-08-08 NOTE — Telephone Encounter (Signed)
Working on PA

## 2016-08-08 NOTE — Assessment & Plan Note (Signed)
Leukocytosis with neutrophilia dating back to at least 2010; JAK2 and BCR/ABL negative.  No documented peripheral flow cytometry.    Labs today: CBC diff.  I personally reviewed and went over laboratory results with the patient.  The results are noted within this dictation.  Labs in 6 months: CBC diff.

## 2016-08-08 NOTE — Progress Notes (Signed)
Clinical Summary Mr. Walth is a 76 y.o.male seen today for follow up of the following medical problems.   1. Chronic diastolic heart failure  - no recent SOB/DOE, no LE edema - weight today 275, down from 280 lbs. Limiting sodium intake.  - compliant with meds  2. Hyperlipidemia - he continues to be compliant with statin  3.HTN - he reports home bp's around 130s/80s   4. Fatigue - feeling sleepy, fatigue. No enerngy. +daytime somnolence. All worst from baseline.  - occasional chills. - ongoing last few weeks - has upcoming labs with cancer center - poor compliance with CPAP for his OSA due to discomfort  5. Thrombocytopenia - followed by hematology    Past Medical History:  Diagnosis Date  . Bilateral lower leg cellulitis   . BPH (benign prostatic hyperplasia)   . Cataract    left  . CHF (congestive heart failure) ([place])   . Chronic diastolic HF (heart failure) ([place])   . Collagen vascular disease ([place])    history of venous insufficency and LE cellulitis  . Complication of anesthesia    2012 due to sleep apnea pt has had difficulty being put under  and waking up from anesthesia  . Deep venous insufficiency   . GERD (gastroesophageal reflux disease)   . Glaucoma    left  . Headache   . Heel spur    right heel  . History of bladder infections   . History of colonic polyps   . Hyperlipidemia   . Hypertension    Sees Dr. Debara Pickett  . Hypothyroidism    hypothyroid denies no rx  . Iron deficiency 07/07/2016  . Kidney stones    passed them  . Obesity   . Osteoarthritis   . PONV (postoperative nausea and vomiting)   . PUD (peptic ulcer disease)   . Ringing in ears   . S/P colonoscopy 2007, Feb and March 2012   hx of adenomas, left-sided diverticula, On 07/2010 TCS, fresh blood and clot noted coming from TI.  . S/P endoscopy 2007, 2012   2007: nl, May 2012: antral erosions  . SIRS (systemic inflammatory response syndrome) ([place]) 02/27/2012  . Sleep apnea      uses cpap  occ "unable to use lately due to nasal pain when use" - sleep study 2008 AHI 48.80/hr and 56.70hr during REM  . Thrombocytopenia ([place]) 12/30/2011   Stable     Allergies  Allergen Reactions  . Aspirin Other (See Comments)    Nausea and upset stomach    . Nexium [Esomeprazole Magnesium] Other (See Comments)    Patient said it messed his stomach up     Current Outpatient Prescriptions  Medication Sig Dispense Refill  . acetaminophen (TYLENOL) 325 MG tablet Take 650 mg by mouth every 6 (six) hours as needed.    Marland Kitchen amLODipine (NORVASC) 5 MG tablet Take 10 mg by mouth daily.     . bisacodyl (DULCOLAX) 5 MG EC tablet Take 1 tablet (5 mg total) by mouth daily. (Patient not taking: Reported on 07/28/2016) 30 tablet 0  . brimonidine (ALPHAGAN) 0.2 % ophthalmic solution Place 1 drop into the right eye 2 times daily.    . calcium citrate (CALCITRATE - DOSED IN MG ELEMENTAL CALCIUM) 950 MG tablet Take 200 mg of elemental calcium by mouth daily. Takes 3 times per week    . Cholecalciferol (VITAMIN D3) 1000 units CAPS Take 1 capsule by mouth. Takes 3 times per week    .  dexlansoprazole (DEXILANT) 60 MG capsule Take 1 capsule (60 mg total) by mouth daily. 90 capsule 3  . docusate sodium (COLACE) 100 MG capsule Take 200 mg by mouth daily as needed for mild constipation. Reported on 09/11/2015    . dorzolamide-timolol (COSOPT) 22.3-6.8 MG/ML ophthalmic solution Place 1 drop into both eyes 2 (two) times daily.    . ferrous sulfate 325 (65 FE) MG tablet Take 325 mg by mouth daily with breakfast.    . Ginger, Zingiber officinalis, (GINGER ROOT PO) Take 1 tablet by mouth as needed.     Marland Kitchen HYDROmorphone (DILAUDID) 4 MG tablet Take 1 tablet (4 mg total) by mouth every 6 (six) hours as needed for severe pain. 15 tablet 0  . latanoprost (XALATAN) 0.005 % ophthalmic solution Place 1 drop into both eyes at bedtime.    . magnesium oxide (MAG-OX) 400 MG tablet Take 400 mg by mouth daily. Takes 2 times per  week    . metolazone (ZAROXOLYN) 2.5 MG tablet Take 1 tablet (2.5 mg total) by mouth every Wednesday. 10 tablet 0  . metoprolol tartrate (LOPRESSOR) 25 MG tablet Take 1 tablet (25 mg total) by mouth daily. 90 tablet 3  . Oxycodone HCl 10 MG TABS Take 10 mg by mouth 2 (two) times daily as needed (pain).     . pantoprazole (PROTONIX) 40 MG tablet Take 40 mg by mouth 2 (two) times daily.    Vladimir Faster Glycol-Propyl Glycol (LUBRICANT EYE DROPS) 0.4-0.3 % SOLN Apply 1 drop to eye daily as needed (for lubricant eye drops).    . potassium chloride SA (K-DUR,KLOR-CON) 20 MEQ tablet Take 1 tablet (20 mEq total) by mouth 2 (two) times daily. (Patient taking differently: Take 20 mEq by mouth 4 (four) times daily. ) 60 tablet 3  . predniSONE (STERAPRED UNI-PAK 21 TAB) 10 MG (21) TBPK tablet Take by mouth daily. Take 6 tabs by mouth daily  for 2 days, then 5 tabs for 2 days, then 4 tabs for 2 days, then 3 tabs for 2 days, 2 tabs for 2 days, then 1 tab by mouth daily for 2 days 42 tablet 0  . tiZANidine (ZANAFLEX) 4 MG tablet Take 4 mg by mouth 2 (two) times daily.     Marland Kitchen torsemide (DEMADEX) 20 MG tablet Take 2 tablets (40 mg total) by mouth 2 (two) times daily.     No current facility-administered medications for this visit.      Past Surgical History:  Procedure Laterality Date  . BACK SURGERY    . CARDIAC CATHETERIZATION  02/27/2007   no sign of CAD, LVH, mod pulm HTN (Dr. Jackie Plum(  . CATARACT EXTRACTION W/ INTRAOCULAR LENS IMPLANT Right ~ 2012  . COLONOSCOPY N/A 05/28/2014   QMG:[id number] colonic polyps/colonic diverticulosis. Tubular adenomas. Next colonoscopy January 2021  . ESOPHAGOGASTRODUODENOSCOPY  Aug 2013   [facility], single 50mm pedunculated polyp otherwise normal. HYPERPLASTIC, NEGATIVE h.pylori  . ESOPHAGOGASTRODUODENOSCOPY N/A 05/28/2014   RMR:non-critical Schatzki's ring/HH. Benign gastritis  . INGUINAL HERNIA REPAIR Right 1941  . KNEE ARTHROSCOPY Left ~ 2000  . LAPAROSCOPIC CHOLECYSTECTOMY      . [place] SURGERY  2012  . MALONEY DILATION N/A 05/28/2014   Procedure: Venia Minks DILATION;  Surgeon: Daneil Dolin, MD;  [facility];  Service: Endoscopy;  Laterality: N/A;  . NASAL SEPTOPLASTY W/ TURBINOPLASTY Bilateral 10/07/2015   Procedure: NASAL SEPTOPLASTY WITH TURBINATE REDUCTION;  Surgeon: Leta Baptist, MD;  Location: [facility];  Service: ENT;  Laterality: Bilateral;  .  NASAL SEPTUM SURGERY Bilateral 10/07/2015    inferior turbinate resection  . NM MYOCAR PERF WALL MOTION  12/2006   dipyridamole myoview - stress images show mild perfusion defect in mid inferior walls with reversibility at rest; mild perfusion defect in mid inferolateral wall at stress with mild defect reversibility, EF 62%, abnormal but low risk study   . POSTERIOR LUMBAR FUSION  2015  . SAVORY DILATION N/A 05/28/2014   Procedure: SAVORY DILATION;  Surgeon: Daneil Dolin, MD;  [facility];  Service: Endoscopy;  Laterality: N/A;  . SHOULDER OPEN ROTATOR CUFF REPAIR Left early 2000s  . small bowel capsule study  07/2011   ?transient focal ischemia in distal small bowel to explain GI bleeding  . TRANSTHORACIC ECHOCARDIOGRAM  09/2008   EF 60-65%, mod conc LVH  . TRANSURETHRAL RESECTION OF PROSTATE    . TRANSURETHRAL RESECTION OF PROSTATE N/A 06/18/2013   Procedure: TRANSURETHRAL RESECTION OF THE PROSTATE (TURP);  Surgeon: Marissa Nestle, MD;  Location: [facility];  Service: Urology;  Laterality: N/A;     Allergies  Allergen Reactions  . Aspirin Other (See Comments)    Nausea and upset stomach    . Nexium [Esomeprazole Magnesium] Other (See Comments)    Patient said it messed his stomach up      Family History  Problem Relation Age of Onset  . Colon cancer Father 29    deceased  . Prostate cancer Father   . Pancreatic cancer Mother 46    deceased  . Prostate cancer Brother   . Arthritis Son   . Arthritis Son   . Diabetes Mellitus II Son      Social History Mr. Mccluney reports that he  quit smoking about 43 years ago. His smoking use included Cigars. He started smoking about 56 years ago. He quit after 8.00 years of use. He has never used smokeless tobacco. Mr. Hufstetler reports that he drinks alcohol.   Review of Systems CONSTITUTIONAL: No weight loss, fever, chills, weakness or fatigue.  HEENT: Eyes: No visual loss, blurred vision, double vision or yellow sclerae.No hearing loss, sneezing, congestion, runny nose or sore throat.  SKIN: No rash or itching.  CARDIOVASCULAR: per hpi RESPIRATORY: No shortness of breath, cough or sputum.  GASTROINTESTINAL: No anorexia, nausea, vomiting or diarrhea. No abdominal pain or blood.  GENITOURINARY: No burning on urination, no polyuria NEUROLOGICAL: No headache, dizziness, syncope, paralysis, ataxia, numbness or tingling in the extremities. No change in bowel or bladder control.  MUSCULOSKELETAL: No muscle, back pain, joint pain or stiffness.  LYMPHATICS: No enlarged nodes. No history of splenectomy.  PSYCHIATRIC: No history of depression or anxiety.  ENDOCRINOLOGIC: No reports of sweating, cold or heat intolerance. No polyuria or polydipsia.  Marland Kitchen   Physical Examination Vitals:   08/08/16 1318  BP: 103/70  Pulse: 73   Vitals:   08/08/16 1318  Weight: 275 lb 9.6 oz (125 kg)  Height: 5\' 7"  (1.702 m)    Gen: resting comfortably, no acute distress HEENT: no scleral icterus, pupils equal round and reactive, no palptable cervical adenopathy,  CV: RRR, no m/r/g, no jvd Resp: Clear to auscultation bilaterally GI: abdomen is soft, non-tender, non-distended, normal bowel sounds, no hepatosplenomegaly MSK: extremities are warm, no edema.  Skin: warm, no rash Neuro:  no focal deficits Psych: appropriate affect   Diagnostic Studies 04/2013 Echo Study Conclusions  - Procedure narrative: Transthoracic echocardiography. Technically difficult study with reduced echocardiographic windows. - Left ventricle: The cavity size was  normal. There was moderate concentric hypertrophy. Systolic function was vigorous. The estimated ejection fraction was in the range of 65% to 70%. Wall motion was normal; there were no regional wall motion abnormalities. Doppler parameters are consistent with abnormal left ventricular relaxation (grade 1 diastolic dysfunction). The E/e' ratio is <10, suggesting normal LV filling pressure. - Left atrium: The atrium was normal in size. - Tricuspid valve: Mild regurgitation. - Pericardium, extracardiac: There was no pericardial effusion.     Assessment and Plan  1. Chronic diastolic heart failure  - appers euvolemic, continue current meds   2. Hyperlipidemia -he will continue current statin -    3. HTN -historically labile, recently has been well controlled - at goal today, continue current meds  4. Fatigue - possibly due to poor CPAP compliance. Refer to Dr Luan Pulling to consider CPAP adjustments to improve compliance.   5. Morbid obesity - BMI 43. Counseled on dietary changes and weight loss.  F/u 6 months      Arnoldo Lenis, M.D.

## 2016-08-08 NOTE — Patient Instructions (Signed)
Your physician wants you to follow-up in: [place] DR. BRANCH You will receive a reminder letter in the mail two months in advance. If you don't receive a letter, please call our office to schedule the follow-up appointment.  You have been referred to DR. [place]   Your physician recommends that you continue on your current medications as directed. Please refer to the Current Medication list given to you today.

## 2016-08-08 NOTE — Assessment & Plan Note (Addendum)
Thrombocytopenia dating back to at least 2009 ranging in the 80's to 150's.  Very stable.    Labs today CBC diff.  I personally reviewed and went over laboratory results with the patient.  The results are noted within this dictation.    Labs in 6 months: CBC diff.  Return in 6 months for follow-up.  Patient wishes to return in 6 months.  I have offered to decrease frequency of lab work and follow-up appointments, but patient wishes to remain on a twice per year follow-up schedule.

## 2016-08-09 ENCOUNTER — Encounter (HOSPITAL_COMMUNITY): Payer: PPO

## 2016-08-09 ENCOUNTER — Encounter (HOSPITAL_COMMUNITY): Payer: PPO | Attending: Oncology | Admitting: Oncology

## 2016-08-09 ENCOUNTER — Encounter (HOSPITAL_COMMUNITY): Payer: Self-pay | Admitting: Oncology

## 2016-08-09 VITALS — BP 148/92 | HR 103 | Resp 16 | Ht 67.0 in | Wt 269.0 lb

## 2016-08-09 DIAGNOSIS — I110 Hypertensive heart disease with heart failure: Secondary | ICD-10-CM | POA: Diagnosis not present

## 2016-08-09 DIAGNOSIS — D696 Thrombocytopenia, unspecified: Secondary | ICD-10-CM | POA: Diagnosis not present

## 2016-08-09 DIAGNOSIS — E611 Iron deficiency: Secondary | ICD-10-CM

## 2016-08-09 DIAGNOSIS — F330 Major depressive disorder, recurrent, mild: Secondary | ICD-10-CM | POA: Diagnosis not present

## 2016-08-09 DIAGNOSIS — I509 Heart failure, unspecified: Secondary | ICD-10-CM | POA: Diagnosis not present

## 2016-08-09 DIAGNOSIS — D72829 Elevated white blood cell count, unspecified: Secondary | ICD-10-CM

## 2016-08-09 DIAGNOSIS — E1159 Type 2 diabetes mellitus with other circulatory complications: Secondary | ICD-10-CM | POA: Diagnosis not present

## 2016-08-09 DIAGNOSIS — E063 Autoimmune thyroiditis: Secondary | ICD-10-CM | POA: Diagnosis not present

## 2016-08-09 DIAGNOSIS — M6281 Muscle weakness (generalized): Secondary | ICD-10-CM | POA: Diagnosis not present

## 2016-08-09 DIAGNOSIS — D72825 Bandemia: Secondary | ICD-10-CM | POA: Diagnosis not present

## 2016-08-09 DIAGNOSIS — I872 Venous insufficiency (chronic) (peripheral): Secondary | ICD-10-CM | POA: Diagnosis not present

## 2016-08-09 DIAGNOSIS — R262 Difficulty in walking, not elsewhere classified: Secondary | ICD-10-CM | POA: Diagnosis not present

## 2016-08-09 DIAGNOSIS — M545 Low back pain: Secondary | ICD-10-CM | POA: Diagnosis not present

## 2016-08-09 DIAGNOSIS — G894 Chronic pain syndrome: Secondary | ICD-10-CM | POA: Diagnosis not present

## 2016-08-09 LAB — CBC WITH DIFFERENTIAL/PLATELET
BASOS PCT: 0 %
Basophils Absolute: 0.1 10*3/uL (ref 0.0–0.1)
EOS ABS: 0.1 10*3/uL (ref 0.0–0.7)
Eosinophils Relative: 0 %
HEMATOCRIT: 48 % (ref 39.0–52.0)
HEMOGLOBIN: 16.2 g/dL (ref 13.0–17.0)
LYMPHS ABS: 2.5 10*3/uL (ref 0.7–4.0)
Lymphocytes Relative: 12 %
MCH: 24.9 pg — AB (ref 26.0–34.0)
MCHC: 33.8 g/dL (ref 30.0–36.0)
MCV: 73.7 fL — ABNORMAL LOW (ref 78.0–100.0)
MONO ABS: 0.7 10*3/uL (ref 0.1–1.0)
MONOS PCT: 4 %
NEUTROS PCT: 84 %
Neutro Abs: 17.3 10*3/uL — ABNORMAL HIGH (ref 1.7–7.7)
Platelets: 164 10*3/uL (ref 150–400)
RBC: 6.51 MIL/uL — ABNORMAL HIGH (ref 4.22–5.81)
RDW: 19.7 % — AB (ref 11.5–15.5)
WBC: 20.6 10*3/uL — ABNORMAL HIGH (ref 4.0–10.5)

## 2016-08-09 LAB — FERRITIN: Ferritin: 35 ng/mL (ref 24–336)

## 2016-08-09 NOTE — Progress Notes (Signed)
[name] MD [place] [address]  Bandemia - Plan: CBC with Differential, Iron and TIBC, Ferritin  Thrombocytopenia (HCC) - Plan: CBC with Differential, Ferritin, Ferritin, Iron and TIBC, Ferritin  Iron deficiency  CURRENT THERAPY: Observation  INTERVAL HISTORY: [name] [name] 76 y.o. male returns for followup of thrombocytopenia, stable AND Mild leukocytosis, stable.  BCR/ABL, JAK2 negative.    He is doing well from a hematologic standpoint. He denies any complaints including B symptoms.    He underwent a septoplasty and bilateral partial inferior turbinate resection by Dr. Benjamine Mola 10/07/2015.  He notes a significant improvement in nasal air movement.  He recently underwent a right knee injection by his primary care provider in his right knee in the medial aspect.  He notes minimal to no improvement in right medial knee discomfort.  He has follow-up accordingly.  He otherwise denies any complaints including infection, antibiotic needs, or hospitalizations.  Review of Systems  Constitutional: Negative.  Negative for chills, fever and weight loss.  HENT: Negative.   Eyes: Negative.   Respiratory: Negative.  Negative for cough.   Cardiovascular: Negative.  Negative for chest pain.  Gastrointestinal: Negative.  Negative for blood in stool, constipation, diarrhea, melena, nausea and vomiting.  Genitourinary: Negative.   Musculoskeletal: Positive for back pain and joint pain (right knee pain, medially.).  Skin: Negative.   Neurological: Negative.  Negative for weakness.  Endo/Heme/Allergies: Negative.   Psychiatric/Behavioral: Negative.     Past Medical History:  Diagnosis Date  . Bilateral lower leg cellulitis   . BPH (benign prostatic hyperplasia)   . Cataract    left  . CHF (congestive heart failure) ([place])   . Chronic diastolic HF (heart failure) ([place])   . Collagen vascular disease ([place])    history of venous insufficency and LE  cellulitis  . Complication of anesthesia    2012 due to sleep apnea pt has had difficulty being put under  and waking up from anesthesia  . Deep venous insufficiency   . GERD (gastroesophageal reflux disease)   . Glaucoma    left  . Headache   . Heel spur    right heel  . History of bladder infections   . History of colonic polyps   . Hyperlipidemia   . Hypertension    Sees Dr. Debara Pickett  . Hypothyroidism    hypothyroid denies no rx  . Iron deficiency 07/07/2016  . Kidney stones    passed them  . Obesity   . Osteoarthritis   . PONV (postoperative nausea and vomiting)   . PUD (peptic ulcer disease)   . Ringing in ears   . S/P colonoscopy 2007, Feb and March 2012   hx of adenomas, left-sided diverticula, On 07/2010 TCS, fresh blood and clot noted coming from TI.  . S/P endoscopy 2007, 2012   2007: nl, May 2012: antral erosions  . SIRS (systemic inflammatory response syndrome) ([place]) 02/27/2012  . Sleep apnea    uses cpap  occ "unable to use lately due to nasal pain when use" - sleep study 2008 AHI 48.80/hr and 56.70hr during REM  . Thrombocytopenia ([place]) 12/30/2011   Stable    Past Surgical History:  Procedure Laterality Date  . BACK SURGERY    . CARDIAC CATHETERIZATION  02/27/2007   no sign of CAD, LVH, mod pulm HTN (Dr. Jackie Plum(  . CATARACT EXTRACTION W/ INTRAOCULAR LENS IMPLANT Right ~ 2012  . COLONOSCOPY N/A 05/28/2014   [id number]  colonic polyps/colonic diverticulosis. Tubular adenomas. Next colonoscopy January 2021  . ESOPHAGOGASTRODUODENOSCOPY  Aug 2013   [facility], single 28mm pedunculated polyp otherwise normal. HYPERPLASTIC, NEGATIVE h.pylori  . ESOPHAGOGASTRODUODENOSCOPY N/A 05/28/2014   RMR:non-critical Schatzki's ring/HH. Benign gastritis  . INGUINAL HERNIA REPAIR Right 1941  . KNEE ARTHROSCOPY Left ~ 2000  . LAPAROSCOPIC CHOLECYSTECTOMY    . [place] SURGERY  2012  . MALONEY DILATION N/A 05/28/2014   Procedure: Venia Minks DILATION;  Surgeon: Daneil Dolin, MD;  [facility];  Service: Endoscopy;  Laterality: N/A;  . NASAL SEPTOPLASTY W/ TURBINOPLASTY Bilateral 10/07/2015   Procedure: NASAL SEPTOPLASTY WITH TURBINATE REDUCTION;  Surgeon: Leta Baptist, MD;  Location: [facility];  Service: ENT;  Laterality: Bilateral;  . NASAL SEPTUM SURGERY Bilateral 10/07/2015    inferior turbinate resection  . NM MYOCAR PERF WALL MOTION  12/2006   dipyridamole myoview - stress images show mild perfusion defect in mid inferior walls with reversibility at rest; mild perfusion defect in mid inferolateral wall at stress with mild defect reversibility, EF 62%, abnormal but low risk study   . POSTERIOR LUMBAR FUSION  2015  . SAVORY DILATION N/A 05/28/2014   Procedure: SAVORY DILATION;  Surgeon: Daneil Dolin, MD;  [facility];  Service: Endoscopy;  Laterality: N/A;  . SHOULDER OPEN ROTATOR CUFF REPAIR Left early 2000s  . small bowel capsule study  07/2011   ?transient focal ischemia in distal small bowel to explain GI bleeding  . TRANSTHORACIC ECHOCARDIOGRAM  09/2008   EF 60-65%, mod conc LVH  . TRANSURETHRAL RESECTION OF PROSTATE    . TRANSURETHRAL RESECTION OF PROSTATE N/A 06/18/2013   Procedure: TRANSURETHRAL RESECTION OF THE PROSTATE (TURP);  Surgeon: Marissa Nestle, MD;  Location: [facility];  Service: Urology;  Laterality: N/A;    Family History  Problem Relation Age of Onset  . Colon cancer Father 17    deceased  . Prostate cancer Father   . Pancreatic cancer Mother 64    deceased  . Prostate cancer Brother   . Arthritis Son   . Arthritis Son   . Diabetes Mellitus II Son     Social History   Social History  . Marital status: Widowed    Spouse name: [name] [name]  . Number of children: 5  . Years of education: College   Occupational History  . disability Retired   Social History Main Topics  . Smoking status: Former Smoker    Years: 8.00    Types: Cigars    Start date: 11/20/1959    Quit date: 08/10/1973  . Smokeless tobacco: Never Used  . Alcohol use  0.0 oz/week     Comment: 10/07/2015 "drank a little bit; stopped in ~ 1975"  . Drug use: No  . Sexual activity: No   Other Topics Concern  . None   Social History Narrative   Patient lives at home with spouse and son.   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         PHYSICAL EXAMINATION  ECOG PERFORMANCE STATUS: 1 - Symptomatic but completely ambulatory  Vitals:   08/09/16 1139  BP: (!) 148/92  Pulse: (!) 103  Resp: 16     GENERAL:alert, no distress, well nourished, well developed, comfortable, cooperative, obese, smiling and unaccompanied today. SKIN: skin color, texture, turgor are normal, no rashes or significant lesions HEAD: Normocephalic, No masses, lesions, tenderness or abnormalities EYES: normal, EOMI, Conjunctiva are pink and non-injected  EARS: External ears normal OROPHARYNX:lips, buccal mucosa, and tongue normal and mucous membranes are moist  NECK: supple, no adenopathy, thyroid normal size, non-tender, without nodularity, trachea midline LYMPH:  no palpable lymphadenopathy BREAST:not examined LUNGS: clear to auscultation without wheezes, rales, or rhonchi. HEART: regular rate & rhythm ABDOMEN:abdomen soft and normal bowel sounds BACK: Back symmetric, no curvature. EXTREMITIES:less then 2 second capillary refill, no joint deformities, no skin discoloration, no clubbing.  Right knee erythema in the medial aspect. NEURO: alert & oriented x 3 with fluent speech, no focal motor/sensory deficits, gait normal   LABORATORY DATA: CBC    Component Value Date/Time   WBC 20.6 (H) 08/09/2016 0949   RBC 6.51 (H) 08/09/2016 0949   HGB 16.2 08/09/2016 0949   HCT 48.0 08/09/2016 0949   PLT 164 08/09/2016 0949   MCV 73.7 (L) 08/09/2016 0949   MCH 24.9 (L) 08/09/2016 0949   MCHC 33.8 08/09/2016 0949   RDW 19.7 (H) 08/09/2016 0949   LYMPHSABS 2.5 08/09/2016 0949   MONOABS 0.7 08/09/2016 0949   EOSABS 0.1 08/09/2016 0949   BASOSABS  0.1 08/09/2016 0949      Chemistry      Component Value Date/Time   NA 134 (L) 03/29/2016 1337   K 3.3 (L) 03/29/2016 1337   CL 97 (L) 03/29/2016 1337   CO2 29 03/29/2016 1337   BUN 15 03/29/2016 1337   CREATININE 1.18 03/29/2016 1337   CREATININE 1.08 11/14/2013 1125      Component Value Date/Time   CALCIUM 9.3 03/29/2016 1337   ALKPHOS 66 03/29/2016 1337   AST 23 03/29/2016 1337   ALT 16 (L) 03/29/2016 1337   BILITOT 0.8 03/29/2016 1337        PENDING LABS:   RADIOGRAPHIC STUDIES:  No results found.   PATHOLOGY:    ASSESSMENT AND PLAN:  Leukocytosis Leukocytosis with neutrophilia dating back to at least 2010; JAK2 and BCR/ABL negative.  No documented peripheral flow cytometry.    Labs today: CBC diff.  I personally reviewed and went over laboratory results with the patient.  The results are noted within this dictation.  Labs in 6 months: CBC diff.  Thrombocytopenia ([place]) Thrombocytopenia dating back to at least 2009 ranging in the 80's to 150's.  Very stable.    Labs today CBC diff.  I personally reviewed and went over laboratory results with the patient.  The results are noted within this dictation.    Labs in 6 months: CBC diff.  Return in 6 months for follow-up.  Patient wishes to return in 6 months.  I have offered to decrease frequency of lab work and follow-up appointments, but patient wishes to remain on a twice per year follow-up schedule.  Iron deficiency Iron deficiency without anemia.  Labs today: CBC diff, ferritin.  I personally reviewed and went over laboratory results with the patient.  The results are noted within this dictation.  HGB is WNL today.  Ferritin is pending.  Labs in 6 months: CBC diff, ferritin.   ORDERS PLACED FOR THIS ENCOUNTER: Orders Placed This Encounter  Procedures  . CBC with Differential  . Ferritin  . Iron and TIBC  . Ferritin    MEDICATIONS PRESCRIBED THIS ENCOUNTER: No orders of the defined types were  placed in this encounter.   THERAPY PLAN:  Ongoing observation as outlined above.  All questions were answered. The patient knows to call the clinic with any problems, questions or concerns. We can certainly see the patient much sooner  if necessary.  Patient and plan discussed with Dr. Twana First and she is in agreement with the aforementioned.   This note is electronically signed by: Doy Mince 08/09/2016 12:15 PM

## 2016-08-09 NOTE — Patient Instructions (Signed)
[place] at [facility] Discharge Instructions  RECOMMENDATIONS MADE BY THE CONSULTANT AND ANY TEST RESULTS WILL BE SENT TO YOUR REFERRING PHYSICIAN.  You were seen today by Kirby Crigler Pa-C. Return in 6 months for labs and follow up.   Thank you for choosing [place] at [facility] to provide your oncology and hematology care.  To afford each patient quality time with our provider, please arrive at least 15 minutes before your scheduled appointment time.    If you have a lab appointment with the [facility] please come in thru the  Main Entrance and check in at the main information desk  You need to re-schedule your appointment should you arrive 10 or more minutes late.  We strive to give you quality time with our providers, and arriving late affects you and other patients whose appointments are after yours.  Also, if you no show three or more times for appointments you may be dismissed from the clinic at the providers discretion.     Again, thank you for choosing [facility].  Our hope is that these requests will decrease the amount of time that you wait before being seen by our physicians.       _____________________________________________________________  Should you have questions after your visit to [facility], please contact our office at [phone] between the hours of 8:30 a.m. and 4:30 p.m.  Voicemails left after 4:30 p.m. will not be returned until the following business day.  For prescription refill requests, have your pharmacy contact our office.       Resources For Cancer Patients and their Caregivers ? American Cancer Society: Can assist with transportation, wigs, general needs, runs Look Good Feel Better.        (410)484-4914 ? Cancer Care: Provides financial assistance, online support groups, medication/co-pay assistance.  1-800-813-HOPE (213)089-4717) ? [facility] Assists [place] cancer patients and their families through emotional , educational and financial support.  616-146-2059 ? [place] DSS Where to apply for food stamps, Medicaid and utility assistance. 218-304-3370 ? [employer]: Transportation to medical appointments. 520 219 1804 ? Social Security Administration: May apply for disability if have a Stage IV cancer. (778)061-9473 (343) 689-8539 ? [employer], Disability and Transit Services: Assists with nutrition, care and transit needs. [place] Support Programs: @10RELATIVEDAYS @ > Cancer Support Group  2nd Tuesday of the month 1pm-2pm, Journey Room  > Creative Journey  3rd Tuesday of the month 1130am-1pm, Journey Room  > Look Good Feel Better  1st Wednesday of the month 10am-12 noon, Journey Room (Call [place] to register [phone])

## 2016-08-09 NOTE — Assessment & Plan Note (Signed)
Iron deficiency without anemia.  Labs today: CBC diff, ferritin.  I personally reviewed and went over laboratory results with the patient.  The results are noted within this dictation.  HGB is WNL today.  Ferritin is pending.  Labs in 6 months: CBC diff, ferritin.

## 2016-08-12 DIAGNOSIS — G894 Chronic pain syndrome: Secondary | ICD-10-CM | POA: Diagnosis not present

## 2016-08-12 DIAGNOSIS — M545 Low back pain: Secondary | ICD-10-CM | POA: Diagnosis not present

## 2016-08-12 DIAGNOSIS — M6281 Muscle weakness (generalized): Secondary | ICD-10-CM | POA: Diagnosis not present

## 2016-08-12 DIAGNOSIS — I509 Heart failure, unspecified: Secondary | ICD-10-CM | POA: Diagnosis not present

## 2016-08-12 DIAGNOSIS — I110 Hypertensive heart disease with heart failure: Secondary | ICD-10-CM | POA: Diagnosis not present

## 2016-08-12 DIAGNOSIS — E063 Autoimmune thyroiditis: Secondary | ICD-10-CM | POA: Diagnosis not present

## 2016-08-12 DIAGNOSIS — I872 Venous insufficiency (chronic) (peripheral): Secondary | ICD-10-CM | POA: Diagnosis not present

## 2016-08-12 DIAGNOSIS — F330 Major depressive disorder, recurrent, mild: Secondary | ICD-10-CM | POA: Diagnosis not present

## 2016-08-12 DIAGNOSIS — R262 Difficulty in walking, not elsewhere classified: Secondary | ICD-10-CM | POA: Diagnosis not present

## 2016-08-12 DIAGNOSIS — E1159 Type 2 diabetes mellitus with other circulatory complications: Secondary | ICD-10-CM | POA: Diagnosis not present

## 2016-08-16 ENCOUNTER — Telehealth: Payer: Self-pay | Admitting: Internal Medicine

## 2016-08-16 ENCOUNTER — Ambulatory Visit (INDEPENDENT_AMBULATORY_CARE_PROVIDER_SITE_OTHER): Payer: PPO | Admitting: Podiatry

## 2016-08-16 ENCOUNTER — Encounter: Payer: Self-pay | Admitting: Podiatry

## 2016-08-16 VITALS — BP 120/76 | HR 90 | Wt 265.0 lb

## 2016-08-16 DIAGNOSIS — B351 Tinea unguium: Secondary | ICD-10-CM | POA: Diagnosis not present

## 2016-08-16 DIAGNOSIS — M79676 Pain in unspecified toe(s): Secondary | ICD-10-CM

## 2016-08-16 DIAGNOSIS — I739 Peripheral vascular disease, unspecified: Secondary | ICD-10-CM | POA: Diagnosis not present

## 2016-08-16 MED ORDER — DEXLANSOPRAZOLE 60 MG PO CPDR: 60.0000 mg | DELAYED_RELEASE_CAPSULE | Freq: Every day | ORAL | 3 refills | Status: DC

## 2016-08-16 NOTE — Telephone Encounter (Signed)
Please tell the patient the Rx was sent to the pharmacy in [place] as requested.

## 2016-08-16 NOTE — Addendum Note (Signed)
Addended by: Gordy Levan, ERIC A on: 08/16/2016 12:09 PM   Modules accepted: Orders

## 2016-08-16 NOTE — Telephone Encounter (Signed)
Routing to the refill box. 

## 2016-08-16 NOTE — Telephone Encounter (Signed)
Pt needs a prescription of dexilant called into [facility]

## 2016-08-16 NOTE — Progress Notes (Signed)
   Subjective:    Patient ID: [name] [name], male    DOB: 03-15-1941, 76 y.o.   MRN: [id number]  HPI this patient presents the office for an evaluation and treatment of his long painful nails. Patient states he was seen by another podiatrist but due to insurance changes. He presents the office today for treatment. He states that his nails are growing under his toes and are painful walking and wearing his shoes. This patient also is concerned about callus on the back of his left heel. He presents the office today for an evaluation and treatment of this condition    Review of Systems     Objective:   Physical Exam GENERAL APPEARANCE: Alert, conversant. Appropriately groomed. No acute distress.  VASCULAR: Pedal pulses are  Not  palpable at  [facility] and PT bilateral.  Capillary refill time is immediate to all digits,  Normal temperature gradient.   NEUROLOGIC: sensation is normal to 5.07 monofilament at 5/5 sites bilateral.  Light touch is intact bilateral, Muscle strength normal.  MUSCULOSKELETAL: acceptable muscle strength, tone and stability bilateral.  Intrinsic muscluature intact bilateral.  Rectus appearance of foot and digits noted bilateral. Plantar fibroma right foot.  DERMATOLOGIC: skin color, texture, and turgor are within normal limits.  No preulcerative lesions or ulcers  are seen, no interdigital maceration noted.  No open lesions present.  . No drainage noted.  Nails  Thick disfigured discolored nails both feet.         Assessment & Plan:  Onychomycosis  PVD  IE  Debride nails.  RTC 3 months   Gardiner Barefoot DPM

## 2016-08-19 DIAGNOSIS — H401123 Primary open-angle glaucoma, left eye, severe stage: Secondary | ICD-10-CM | POA: Diagnosis not present

## 2016-08-19 DIAGNOSIS — H401113 Primary open-angle glaucoma, right eye, severe stage: Secondary | ICD-10-CM | POA: Diagnosis not present

## 2016-08-23 DIAGNOSIS — I110 Hypertensive heart disease with heart failure: Secondary | ICD-10-CM | POA: Diagnosis not present

## 2016-08-23 DIAGNOSIS — M6281 Muscle weakness (generalized): Secondary | ICD-10-CM | POA: Diagnosis not present

## 2016-08-23 DIAGNOSIS — G894 Chronic pain syndrome: Secondary | ICD-10-CM | POA: Diagnosis not present

## 2016-08-23 DIAGNOSIS — I872 Venous insufficiency (chronic) (peripheral): Secondary | ICD-10-CM | POA: Diagnosis not present

## 2016-08-23 DIAGNOSIS — I509 Heart failure, unspecified: Secondary | ICD-10-CM | POA: Diagnosis not present

## 2016-08-23 DIAGNOSIS — E063 Autoimmune thyroiditis: Secondary | ICD-10-CM | POA: Diagnosis not present

## 2016-08-23 DIAGNOSIS — E1159 Type 2 diabetes mellitus with other circulatory complications: Secondary | ICD-10-CM | POA: Diagnosis not present

## 2016-08-23 DIAGNOSIS — F330 Major depressive disorder, recurrent, mild: Secondary | ICD-10-CM | POA: Diagnosis not present

## 2016-08-23 DIAGNOSIS — M545 Low back pain: Secondary | ICD-10-CM | POA: Diagnosis not present

## 2016-08-23 DIAGNOSIS — R262 Difficulty in walking, not elsewhere classified: Secondary | ICD-10-CM | POA: Diagnosis not present

## 2016-08-25 DIAGNOSIS — D509 Iron deficiency anemia, unspecified: Secondary | ICD-10-CM | POA: Diagnosis not present

## 2016-08-25 DIAGNOSIS — R809 Proteinuria, unspecified: Secondary | ICD-10-CM | POA: Diagnosis not present

## 2016-08-25 DIAGNOSIS — N183 Chronic kidney disease, stage 3 (moderate): Secondary | ICD-10-CM | POA: Diagnosis not present

## 2016-08-25 DIAGNOSIS — I10 Essential (primary) hypertension: Secondary | ICD-10-CM | POA: Diagnosis not present

## 2016-08-25 DIAGNOSIS — M545 Low back pain: Secondary | ICD-10-CM | POA: Diagnosis not present

## 2016-08-25 DIAGNOSIS — I872 Venous insufficiency (chronic) (peripheral): Secondary | ICD-10-CM | POA: Diagnosis not present

## 2016-08-25 DIAGNOSIS — F330 Major depressive disorder, recurrent, mild: Secondary | ICD-10-CM | POA: Diagnosis not present

## 2016-08-25 DIAGNOSIS — G894 Chronic pain syndrome: Secondary | ICD-10-CM | POA: Diagnosis not present

## 2016-08-25 DIAGNOSIS — Z79899 Other long term (current) drug therapy: Secondary | ICD-10-CM | POA: Diagnosis not present

## 2016-08-25 DIAGNOSIS — M6281 Muscle weakness (generalized): Secondary | ICD-10-CM | POA: Diagnosis not present

## 2016-08-25 DIAGNOSIS — E559 Vitamin D deficiency, unspecified: Secondary | ICD-10-CM | POA: Diagnosis not present

## 2016-08-25 DIAGNOSIS — R262 Difficulty in walking, not elsewhere classified: Secondary | ICD-10-CM | POA: Diagnosis not present

## 2016-08-25 DIAGNOSIS — E063 Autoimmune thyroiditis: Secondary | ICD-10-CM | POA: Diagnosis not present

## 2016-08-25 DIAGNOSIS — I509 Heart failure, unspecified: Secondary | ICD-10-CM | POA: Diagnosis not present

## 2016-08-25 DIAGNOSIS — I110 Hypertensive heart disease with heart failure: Secondary | ICD-10-CM | POA: Diagnosis not present

## 2016-08-25 DIAGNOSIS — E1159 Type 2 diabetes mellitus with other circulatory complications: Secondary | ICD-10-CM | POA: Diagnosis not present

## 2016-08-26 DIAGNOSIS — M545 Low back pain: Secondary | ICD-10-CM | POA: Diagnosis not present

## 2016-08-26 DIAGNOSIS — G894 Chronic pain syndrome: Secondary | ICD-10-CM | POA: Diagnosis not present

## 2016-08-26 DIAGNOSIS — I509 Heart failure, unspecified: Secondary | ICD-10-CM | POA: Diagnosis not present

## 2016-08-26 DIAGNOSIS — R262 Difficulty in walking, not elsewhere classified: Secondary | ICD-10-CM | POA: Diagnosis not present

## 2016-08-26 DIAGNOSIS — I872 Venous insufficiency (chronic) (peripheral): Secondary | ICD-10-CM | POA: Diagnosis not present

## 2016-08-26 DIAGNOSIS — E063 Autoimmune thyroiditis: Secondary | ICD-10-CM | POA: Diagnosis not present

## 2016-08-26 DIAGNOSIS — E1159 Type 2 diabetes mellitus with other circulatory complications: Secondary | ICD-10-CM | POA: Diagnosis not present

## 2016-08-26 DIAGNOSIS — M6281 Muscle weakness (generalized): Secondary | ICD-10-CM | POA: Diagnosis not present

## 2016-08-26 DIAGNOSIS — F330 Major depressive disorder, recurrent, mild: Secondary | ICD-10-CM | POA: Diagnosis not present

## 2016-08-26 DIAGNOSIS — I110 Hypertensive heart disease with heart failure: Secondary | ICD-10-CM | POA: Diagnosis not present

## 2016-08-29 DIAGNOSIS — M5416 Radiculopathy, lumbar region: Secondary | ICD-10-CM | POA: Diagnosis not present

## 2016-08-29 DIAGNOSIS — M47816 Spondylosis without myelopathy or radiculopathy, lumbar region: Secondary | ICD-10-CM | POA: Diagnosis not present

## 2016-08-29 DIAGNOSIS — M5126 Other intervertebral disc displacement, lumbar region: Secondary | ICD-10-CM | POA: Diagnosis not present

## 2016-08-30 DIAGNOSIS — D649 Anemia, unspecified: Secondary | ICD-10-CM | POA: Diagnosis not present

## 2016-08-30 DIAGNOSIS — I10 Essential (primary) hypertension: Secondary | ICD-10-CM | POA: Diagnosis not present

## 2016-08-30 DIAGNOSIS — I509 Heart failure, unspecified: Secondary | ICD-10-CM | POA: Diagnosis not present

## 2016-08-30 DIAGNOSIS — N182 Chronic kidney disease, stage 2 (mild): Secondary | ICD-10-CM | POA: Diagnosis not present

## 2016-08-30 DIAGNOSIS — R809 Proteinuria, unspecified: Secondary | ICD-10-CM | POA: Diagnosis not present

## 2016-08-30 DIAGNOSIS — J309 Allergic rhinitis, unspecified: Secondary | ICD-10-CM | POA: Diagnosis not present

## 2016-08-30 DIAGNOSIS — G4733 Obstructive sleep apnea (adult) (pediatric): Secondary | ICD-10-CM | POA: Diagnosis not present

## 2016-08-30 DIAGNOSIS — G4730 Sleep apnea, unspecified: Secondary | ICD-10-CM | POA: Diagnosis not present

## 2016-09-07 DIAGNOSIS — M25561 Pain in right knee: Secondary | ICD-10-CM | POA: Diagnosis not present

## 2016-09-07 DIAGNOSIS — M1711 Unilateral primary osteoarthritis, right knee: Secondary | ICD-10-CM | POA: Diagnosis not present

## 2016-09-07 DIAGNOSIS — Z6835 Body mass index (BMI) 35.0-35.9, adult: Secondary | ICD-10-CM | POA: Diagnosis not present

## 2016-09-07 DIAGNOSIS — G8929 Other chronic pain: Secondary | ICD-10-CM | POA: Diagnosis not present

## 2016-09-07 DIAGNOSIS — I10 Essential (primary) hypertension: Secondary | ICD-10-CM | POA: Diagnosis not present

## 2016-09-07 DIAGNOSIS — M21161 Varus deformity, not elsewhere classified, right knee: Secondary | ICD-10-CM | POA: Diagnosis not present

## 2016-09-07 DIAGNOSIS — M5126 Other intervertebral disc displacement, lumbar region: Secondary | ICD-10-CM | POA: Diagnosis not present

## 2016-09-12 ENCOUNTER — Other Ambulatory Visit: Payer: Self-pay

## 2016-09-12 ENCOUNTER — Inpatient Hospital Stay (HOSPITAL_COMMUNITY)
Admission: EM | Admit: 2016-09-12 | Discharge: 2016-09-14 | DRG: 065 | Disposition: A | Discharge status: Skilled Nursing Facility | Payer: PPO | Attending: Internal Medicine | Admitting: Internal Medicine

## 2016-09-12 ENCOUNTER — Emergency Department (HOSPITAL_COMMUNITY): Payer: PPO

## 2016-09-12 ENCOUNTER — Encounter (HOSPITAL_COMMUNITY): Payer: Self-pay

## 2016-09-12 ENCOUNTER — Observation Stay (HOSPITAL_COMMUNITY): Payer: PPO

## 2016-09-12 DIAGNOSIS — G4730 Sleep apnea, unspecified: Secondary | ICD-10-CM | POA: Diagnosis not present

## 2016-09-12 DIAGNOSIS — Z886 Allergy status to analgesic agent status: Secondary | ICD-10-CM

## 2016-09-12 DIAGNOSIS — R55 Syncope and collapse: Secondary | ICD-10-CM | POA: Diagnosis present

## 2016-09-12 DIAGNOSIS — N179 Acute kidney failure, unspecified: Secondary | ICD-10-CM | POA: Diagnosis present

## 2016-09-12 DIAGNOSIS — I872 Venous insufficiency (chronic) (peripheral): Secondary | ICD-10-CM | POA: Diagnosis present

## 2016-09-12 DIAGNOSIS — K219 Gastro-esophageal reflux disease without esophagitis: Secondary | ICD-10-CM | POA: Diagnosis present

## 2016-09-12 DIAGNOSIS — T502X5A Adverse effect of carbonic-anhydrase inhibitors, benzothiadiazides and other diuretics, initial encounter: Secondary | ICD-10-CM | POA: Diagnosis not present

## 2016-09-12 DIAGNOSIS — Z833 Family history of diabetes mellitus: Secondary | ICD-10-CM

## 2016-09-12 DIAGNOSIS — E876 Hypokalemia: Secondary | ICD-10-CM | POA: Diagnosis present

## 2016-09-12 DIAGNOSIS — N400 Enlarged prostate without lower urinary tract symptoms: Secondary | ICD-10-CM | POA: Diagnosis not present

## 2016-09-12 DIAGNOSIS — I639 Cerebral infarction, unspecified: Secondary | ICD-10-CM | POA: Diagnosis not present

## 2016-09-12 DIAGNOSIS — H409 Unspecified glaucoma: Secondary | ICD-10-CM | POA: Diagnosis not present

## 2016-09-12 DIAGNOSIS — E611 Iron deficiency: Secondary | ICD-10-CM | POA: Diagnosis not present

## 2016-09-12 DIAGNOSIS — Z66 Do not resuscitate: Secondary | ICD-10-CM | POA: Diagnosis present

## 2016-09-12 DIAGNOSIS — W19XXXA Unspecified fall, initial encounter: Secondary | ICD-10-CM | POA: Diagnosis present

## 2016-09-12 DIAGNOSIS — R42 Dizziness and giddiness: Secondary | ICD-10-CM | POA: Diagnosis not present

## 2016-09-12 DIAGNOSIS — E785 Hyperlipidemia, unspecified: Secondary | ICD-10-CM | POA: Diagnosis present

## 2016-09-12 DIAGNOSIS — Z981 Arthrodesis status: Secondary | ICD-10-CM | POA: Diagnosis not present

## 2016-09-12 DIAGNOSIS — I951 Orthostatic hypotension: Secondary | ICD-10-CM | POA: Diagnosis not present

## 2016-09-12 DIAGNOSIS — E039 Hypothyroidism, unspecified: Secondary | ICD-10-CM | POA: Diagnosis not present

## 2016-09-12 DIAGNOSIS — E860 Dehydration: Secondary | ICD-10-CM | POA: Diagnosis present

## 2016-09-12 DIAGNOSIS — I5032 Chronic diastolic (congestive) heart failure: Secondary | ICD-10-CM | POA: Diagnosis present

## 2016-09-12 DIAGNOSIS — Z6841 Body Mass Index (BMI) 40.0 and over, adult: Secondary | ICD-10-CM

## 2016-09-12 DIAGNOSIS — R51 Headache: Secondary | ICD-10-CM | POA: Diagnosis not present

## 2016-09-12 DIAGNOSIS — Z87442 Personal history of urinary calculi: Secondary | ICD-10-CM

## 2016-09-12 DIAGNOSIS — E1142 Type 2 diabetes mellitus with diabetic polyneuropathy: Secondary | ICD-10-CM | POA: Diagnosis present

## 2016-09-12 DIAGNOSIS — Z87891 Personal history of nicotine dependence: Secondary | ICD-10-CM

## 2016-09-12 DIAGNOSIS — D696 Thrombocytopenia, unspecified: Secondary | ICD-10-CM | POA: Diagnosis present

## 2016-09-12 DIAGNOSIS — I110 Hypertensive heart disease with heart failure: Secondary | ICD-10-CM | POA: Diagnosis present

## 2016-09-12 DIAGNOSIS — Z888 Allergy status to other drugs, medicaments and biological substances status: Secondary | ICD-10-CM

## 2016-09-12 DIAGNOSIS — Z87440 Personal history of urinary (tract) infections: Secondary | ICD-10-CM

## 2016-09-12 DIAGNOSIS — Z79899 Other long term (current) drug therapy: Secondary | ICD-10-CM

## 2016-09-12 DIAGNOSIS — D72829 Elevated white blood cell count, unspecified: Secondary | ICD-10-CM | POA: Diagnosis not present

## 2016-09-12 DIAGNOSIS — Z8711 Personal history of peptic ulcer disease: Secondary | ICD-10-CM

## 2016-09-12 DIAGNOSIS — E1165 Type 2 diabetes mellitus with hyperglycemia: Secondary | ICD-10-CM | POA: Diagnosis not present

## 2016-09-12 DIAGNOSIS — Z86010 Personal history of colonic polyps: Secondary | ICD-10-CM

## 2016-09-12 DIAGNOSIS — R918 Other nonspecific abnormal finding of lung field: Secondary | ICD-10-CM | POA: Diagnosis not present

## 2016-09-12 LAB — DIFFERENTIAL
Basophils Absolute: 0 10*3/uL (ref 0.0–0.1)
Basophils Relative: 0 %
EOS ABS: 0.1 10*3/uL (ref 0.0–0.7)
EOS PCT: 0 %
Lymphocytes Relative: 8 %
Lymphs Abs: 1.2 10*3/uL (ref 0.7–4.0)
Monocytes Absolute: 1 10*3/uL (ref 0.1–1.0)
Monocytes Relative: 6 %
NEUTROS PCT: 85 %
Neutro Abs: 13.3 10*3/uL — ABNORMAL HIGH (ref 1.7–7.7)

## 2016-09-12 LAB — I-STAT CHEM 8, ED
BUN: 32 mg/dL — AB (ref 6–20)
Calcium, Ion: 1.15 mmol/L (ref 1.15–1.40)
Chloride: 87 mmol/L — ABNORMAL LOW (ref 101–111)
Creatinine, Ser: 1.6 mg/dL — ABNORMAL HIGH (ref 0.61–1.24)
Glucose, Bld: 151 mg/dL — ABNORMAL HIGH (ref 65–99)
HEMATOCRIT: 51 % (ref 39.0–52.0)
HEMOGLOBIN: 17.3 g/dL — AB (ref 13.0–17.0)
Potassium: 2.4 mmol/L — CL (ref 3.5–5.1)
SODIUM: 136 mmol/L (ref 135–145)
TCO2: 36 mmol/L (ref 0–100)

## 2016-09-12 LAB — CBC
HCT: 48.4 % (ref 39.0–52.0)
HCT: 46.1 % (ref 39.0–52.0)
Hemoglobin: 16.6 g/dL (ref 13.0–17.0)
Hemoglobin: 15.7 g/dL (ref 13.0–17.0)
MCH: 26.1 pg (ref 26.0–34.0)
MCH: 26.1 pg (ref 26.0–34.0)
MCHC: 34.3 g/dL (ref 30.0–36.0)
MCHC: 34.1 g/dL (ref 30.0–36.0)
MCV: 76.1 fL — ABNORMAL LOW (ref 78.0–100.0)
MCV: 76.6 fL — ABNORMAL LOW (ref 78.0–100.0)
PLATELETS: 141 10*3/uL — AB (ref 150–400)
Platelets: 133 10*3/uL — ABNORMAL LOW (ref 150–400)
RBC: 6.36 MIL/uL — ABNORMAL HIGH (ref 4.22–5.81)
RBC: 6.02 MIL/uL — ABNORMAL HIGH (ref 4.22–5.81)
RDW: 19.6 % — ABNORMAL HIGH (ref 11.5–15.5)
RDW: 19.3 % — AB (ref 11.5–15.5)
WBC: 15.6 10*3/uL — ABNORMAL HIGH (ref 4.0–10.5)
WBC: 17.1 10*3/uL — ABNORMAL HIGH (ref 4.0–10.5)

## 2016-09-12 LAB — APTT: aPTT: 30 seconds (ref 24–36)

## 2016-09-12 LAB — TSH: TSH: 5.455 u[IU]/mL — ABNORMAL HIGH (ref 0.350–4.500)

## 2016-09-12 LAB — URINALYSIS, ROUTINE W REFLEX MICROSCOPIC
Bilirubin Urine: NEGATIVE
GLUCOSE, UA: NEGATIVE mg/dL
Hgb urine dipstick: NEGATIVE
KETONES UR: NEGATIVE mg/dL
Leukocytes, UA: NEGATIVE
Nitrite: NEGATIVE
PH: 7 (ref 5.0–8.0)
Protein, ur: NEGATIVE mg/dL
SPECIFIC GRAVITY, URINE: 1.008 (ref 1.005–1.030)

## 2016-09-12 LAB — CREATININE, SERUM
CREATININE: 1.39 mg/dL — AB (ref 0.61–1.24)
GFR calc Af Amer: 55 mL/min — ABNORMAL LOW (ref >60)
GFR, EST NON AFRICAN AMERICAN: 48 mL/min — AB (ref >60)

## 2016-09-12 LAB — CBG MONITORING, ED: Glucose-Capillary: 162 mg/dL — ABNORMAL HIGH (ref 65–99)

## 2016-09-12 LAB — COMPREHENSIVE METABOLIC PANEL
ALBUMIN: 3.7 g/dL (ref 3.5–5.0)
ALT: 18 U/L (ref 17–63)
ANION GAP: 12 (ref 5–15)
AST: 21 U/L (ref 15–41)
Alkaline Phosphatase: 67 U/L (ref 38–126)
BUN: 32 mg/dL — ABNORMAL HIGH (ref 6–20)
CO2: 37 mmol/L — AB (ref 22–32)
Calcium: 10 mg/dL (ref 8.9–10.3)
Chloride: 86 mmol/L — ABNORMAL LOW (ref 101–111)
Creatinine, Ser: 1.41 mg/dL — ABNORMAL HIGH (ref 0.61–1.24)
GFR calc Af Amer: 54 mL/min — ABNORMAL LOW (ref >60)
GFR calc non Af Amer: 47 mL/min — ABNORMAL LOW (ref >60)
GLUCOSE: 153 mg/dL — AB (ref 65–99)
POTASSIUM: 2.4 mmol/L — AB (ref 3.5–5.1)
SODIUM: 135 mmol/L (ref 135–145)
TOTAL PROTEIN: 7 g/dL (ref 6.5–8.1)
Total Bilirubin: 1.3 mg/dL — ABNORMAL HIGH (ref 0.3–1.2)

## 2016-09-12 LAB — RAPID URINE DRUG SCREEN, HOSP PERFORMED
AMPHETAMINES: NOT DETECTED
Barbiturates: NOT DETECTED
Benzodiazepines: NOT DETECTED
COCAINE: NOT DETECTED
OPIATES: NOT DETECTED
TETRAHYDROCANNABINOL: NOT DETECTED

## 2016-09-12 LAB — PROTIME-INR
INR: 0.94
PROTHROMBIN TIME: 12.6 s (ref 11.4–15.2)

## 2016-09-12 LAB — TROPONIN I: Troponin I: <0.03 ng/mL (ref <0.03)

## 2016-09-12 LAB — I-STAT TROPONIN, ED: Troponin i, poc: 0 ng/mL (ref 0.00–0.08)

## 2016-09-12 LAB — ETHANOL: Alcohol, Ethyl (B): <5 mg/dL (ref <5)

## 2016-09-12 MED ORDER — POTASSIUM CHLORIDE CRYS ER 20 MEQ PO TBCR
40.0000 meq | EXTENDED_RELEASE_TABLET | Freq: Once | ORAL | Status: AC
  Administered 2016-09-12: 40 meq via ORAL
  Filled 2016-09-12: qty 2

## 2016-09-12 MED ORDER — TIZANIDINE HCL 4 MG PO TABS
4.0000 mg | ORAL_TABLET | Freq: Two times a day (BID) | ORAL | Status: DC
  Administered 2016-09-12 – 2016-09-14 (×4): 4 mg via ORAL
  Filled 2016-09-12 (×4): qty 1

## 2016-09-12 MED ORDER — LATANOPROST 0.005 % OP SOLN
1.0000 [drp] | Freq: Every day | OPHTHALMIC | Status: DC
  Administered 2016-09-12 – 2016-09-13 (×2): 1 [drp] via OPHTHALMIC
  Filled 2016-09-12: qty 2.5

## 2016-09-12 MED ORDER — POTASSIUM CHLORIDE IN NACL 40-0.9 MEQ/L-% IV SOLN
INTRAVENOUS | Status: DC
  Administered 2016-09-12 – 2016-09-13 (×2): 100 mL/h via INTRAVENOUS

## 2016-09-12 MED ORDER — POLYVINYL ALCOHOL 1.4 % OP SOLN: 1.0000 [drp] | Freq: Every day | OPHTHALMIC | Status: DC | PRN

## 2016-09-12 MED ORDER — BISACODYL 5 MG PO TBEC
5.0000 mg | DELAYED_RELEASE_TABLET | Freq: Every day | ORAL | Status: DC
  Administered 2016-09-12 – 2016-09-14 (×3): 5 mg via ORAL
  Filled 2016-09-12 (×3): qty 1

## 2016-09-12 MED ORDER — ACETAMINOPHEN 325 MG PO TABS
650.0000 mg | ORAL_TABLET | Freq: Four times a day (QID) | ORAL | Status: DC | PRN
  Administered 2016-09-13 – 2016-09-14 (×2): 650 mg via ORAL
  Filled 2016-09-12 (×2): qty 2

## 2016-09-12 MED ORDER — SODIUM CHLORIDE 0.9% FLUSH
3.0000 mL | Freq: Two times a day (BID) | INTRAVENOUS | Status: DC
  Administered 2016-09-12 – 2016-09-13 (×2): 3 mL via INTRAVENOUS

## 2016-09-12 MED ORDER — POTASSIUM CHLORIDE CRYS ER 20 MEQ PO TBCR
40.0000 meq | EXTENDED_RELEASE_TABLET | ORAL | Status: AC
  Administered 2016-09-12 (×2): 40 meq via ORAL
  Filled 2016-09-12 (×2): qty 2

## 2016-09-12 MED ORDER — ONDANSETRON HCL 4 MG/2ML IJ SOLN: 4.0000 mg | Freq: Four times a day (QID) | INTRAMUSCULAR | Status: DC | PRN

## 2016-09-12 MED ORDER — BRIMONIDINE TARTRATE 0.2 % OP SOLN
1.0000 [drp] | Freq: Two times a day (BID) | OPHTHALMIC | Status: DC
  Administered 2016-09-12 – 2016-09-14 (×4): 1 [drp] via OPHTHALMIC
  Filled 2016-09-12: qty 5

## 2016-09-12 MED ORDER — POTASSIUM CHLORIDE 10 MEQ/100ML IV SOLN
10.0000 meq | INTRAVENOUS | Status: AC
  Administered 2016-09-12 (×3): 10 meq via INTRAVENOUS
  Filled 2016-09-12 (×2): qty 100

## 2016-09-12 MED ORDER — ASPIRIN 300 MG RE SUPP
300.0000 mg | Freq: Once | RECTAL | Status: AC
  Administered 2016-09-12: 300 mg via RECTAL

## 2016-09-12 MED ORDER — ONDANSETRON HCL 4 MG PO TABS: 4.0000 mg | ORAL_TABLET | Freq: Four times a day (QID) | ORAL | Status: DC | PRN

## 2016-09-12 MED ORDER — ENOXAPARIN SODIUM 40 MG/0.4ML ~~LOC~~ SOLN
40.0000 mg | SUBCUTANEOUS | Status: DC
  Administered 2016-09-12 – 2016-09-13 (×2): 40 mg via SUBCUTANEOUS
  Filled 2016-09-12 (×2): qty 0.4

## 2016-09-12 MED ORDER — METOPROLOL TARTRATE 25 MG PO TABS
25.0000 mg | ORAL_TABLET | Freq: Every day | ORAL | Status: DC
  Administered 2016-09-12 – 2016-09-13 (×2): 25 mg via ORAL
  Filled 2016-09-12 (×2): qty 1

## 2016-09-12 MED ORDER — OXYCODONE HCL 5 MG PO TABS
10.0000 mg | ORAL_TABLET | Freq: Two times a day (BID) | ORAL | Status: DC | PRN
  Administered 2016-09-12 – 2016-09-13 (×2): 10 mg via ORAL
  Filled 2016-09-12 (×2): qty 2

## 2016-09-12 MED ORDER — MAGNESIUM SULFATE 2 GM/50ML IV SOLN
2.0000 g | Freq: Once | INTRAVENOUS | Status: AC
  Administered 2016-09-12: 2 g via INTRAVENOUS
  Filled 2016-09-12: qty 50

## 2016-09-12 MED ORDER — FERROUS SULFATE 325 (65 FE) MG PO TABS
325.0000 mg | ORAL_TABLET | Freq: Every day | ORAL | Status: DC
  Administered 2016-09-13 – 2016-09-14 (×2): 325 mg via ORAL
  Filled 2016-09-12 (×2): qty 1

## 2016-09-12 MED ORDER — ACETAMINOPHEN 650 MG RE SUPP: 650.0000 mg | Freq: Four times a day (QID) | RECTAL | Status: DC | PRN

## 2016-09-12 MED ORDER — PANTOPRAZOLE SODIUM 40 MG PO TBEC
40.0000 mg | DELAYED_RELEASE_TABLET | Freq: Two times a day (BID) | ORAL | Status: DC
  Administered 2016-09-12 – 2016-09-14 (×4): 40 mg via ORAL
  Filled 2016-09-12 (×4): qty 1

## 2016-09-12 MED ORDER — AMLODIPINE BESYLATE 5 MG PO TABS
10.0000 mg | ORAL_TABLET | Freq: Every day | ORAL | Status: DC
  Administered 2016-09-12 – 2016-09-13 (×2): 10 mg via ORAL
  Filled 2016-09-12 (×2): qty 2

## 2016-09-12 MED ORDER — DORZOLAMIDE HCL-TIMOLOL MAL 2-0.5 % OP SOLN
1.0000 [drp] | Freq: Two times a day (BID) | OPHTHALMIC | Status: DC
  Administered 2016-09-12 – 2016-09-14 (×4): 1 [drp] via OPHTHALMIC
  Filled 2016-09-12: qty 10

## 2016-09-12 MED ORDER — SODIUM CHLORIDE 0.9 % IV BOLUS (SEPSIS)
1000.0000 mL | Freq: Once | INTRAVENOUS | Status: AC
  Administered 2016-09-12: 1000 mL via INTRAVENOUS

## 2016-09-12 NOTE — ED Notes (Signed)
Pt not in room to start medications 

## 2016-09-12 NOTE — ED Triage Notes (Signed)
Pt reports woke up at 6 am to void and reports room was spinning and he passed out while sitting on the side of the bed.  Tried to get up again around 0700 and room was still spinning.  Reports tried to call for his caretaker but his speech was slurred and he passed out on the bed again.  Pt also reports left sided headache that started around 0700, left sided chest pain, and numbness in 3rd, 4th, and 5th toes on left foot and stinging sensation in left leg. Reports the sensation is not new.   Pt presently denies any chest pain. Pt also says feels a little more sob than usual.

## 2016-09-12 NOTE — H&P (Signed)
History and Physical    [name] [name] UEA:[id number] DOB: [birth year] DOA: 09/12/2016  PCP: [name], MD  Patient coming from: home  I have personally briefly reviewed patient's old medical records in [place]  Chief Complaint: passing out  HPI: [name] [name] is a 76 y.o. male with medical history significant of chronic diastolic congestive heart failure on diuretics, presents with multiple episodes of syncope. Patient reports that he woke up this morning and when he tried to get out of bed, he became increasingly lightheaded and dizzy. He reports falling back in bed and passing out for an unknown period of time. After regaining consciousness, patient called for his wife who attempted to get him up which made him increasingly dizzy again. He says he may have had some mild left-sided chest discomfort today, but is unclear on details. Denies any shortness of breath. No fever, cough, nausea, vomiting, diarrhea. By mouth intake has been normal.. He describes paresthesias in his left lower extremities which has been intermittent for the past week  ED Course: Vitals were noted to be stable in the emergency room. Orthostatic vital signs could not be obtained since patient became increasingly dizzy on standing. Creatinine was mildly elevated at 1.4, potassium was low at 2.4. EKG does not show any acute changes chest x-ray did not show any new findings. Urinalysis was unremarkable. He is referred for admission.  Review of Systems: As per HPI otherwise 10 point review of systems negative.    Past Medical History:  Diagnosis Date  . Bilateral lower leg cellulitis   . BPH (benign prostatic hyperplasia)   . Cataract    left  . CHF (congestive heart failure) ([place])   . Chronic diastolic HF (heart failure) ([place])   . Collagen vascular disease ([place])    history of venous insufficency and LE cellulitis  . Complication of anesthesia    2012 due to sleep apnea pt has had difficulty being  put under  and waking up from anesthesia  . Deep venous insufficiency   . GERD (gastroesophageal reflux disease)   . Glaucoma    left  . Headache   . Heel spur    right heel  . History of bladder infections   . History of colonic polyps   . Hyperlipidemia   . Hypertension    Sees Dr. Debara Pickett  . Hypothyroidism    hypothyroid denies no rx  . Iron deficiency 07/07/2016  . Kidney stones    passed them  . Obesity   . Osteoarthritis   . PONV (postoperative nausea and vomiting)   . PUD (peptic ulcer disease)   . Ringing in ears   . S/P colonoscopy 2007, Feb and March 2012   hx of adenomas, left-sided diverticula, On 07/2010 TCS, fresh blood and clot noted coming from TI.  . S/P endoscopy 2007, 2012   2007: nl, May 2012: antral erosions  . SIRS (systemic inflammatory response syndrome) ([place]) 02/27/2012  . Sleep apnea    uses cpap  occ "unable to use lately due to nasal pain when use" - sleep study 2008 AHI 48.80/hr and 56.70hr during REM  . Thrombocytopenia ([place]) 12/30/2011   Stable    Past Surgical History:  Procedure Laterality Date  . BACK SURGERY    . CARDIAC CATHETERIZATION  02/27/2007   no sign of CAD, LVH, mod pulm HTN (Dr. Jackie Plum(  . CATARACT EXTRACTION W/ INTRAOCULAR LENS IMPLANT Right ~ 2012  . COLONOSCOPY N/A 05/28/2014   YNW:GNFAOZHY colonic  polyps/colonic diverticulosis. Tubular adenomas. Next colonoscopy January 2021  . ESOPHAGOGASTRODUODENOSCOPY  Aug 2013   [facility], single 64mm pedunculated polyp otherwise normal. HYPERPLASTIC, NEGATIVE h.pylori  . ESOPHAGOGASTRODUODENOSCOPY N/A 05/28/2014   RMR:non-critical Schatzki's ring/HH. Benign gastritis  . INGUINAL HERNIA REPAIR Right 1941  . KNEE ARTHROSCOPY Left ~ 2000  . LAPAROSCOPIC CHOLECYSTECTOMY    . [place] SURGERY  2012  . MALONEY DILATION N/A 05/28/2014   Procedure: Venia Minks DILATION;  Surgeon: Daneil Dolin, MD;  Location: [facility] ENDO [facility];  Service: Endoscopy;  Laterality: N/A;  . NASAL SEPTOPLASTY W/ TURBINOPLASTY  Bilateral 10/07/2015   Procedure: NASAL SEPTOPLASTY WITH TURBINATE REDUCTION;  Surgeon: Leta Baptist, MD;  Location: [place];  Service: ENT;  Laterality: Bilateral;  . NASAL SEPTUM SURGERY Bilateral 10/07/2015    inferior turbinate resection  . NM MYOCAR PERF WALL MOTION  12/2006   dipyridamole myoview - stress images show mild perfusion defect in mid inferior walls with reversibility at rest; mild perfusion defect in mid inferolateral wall at stress with mild defect reversibility, EF 62%, abnormal but low risk study   . POSTERIOR LUMBAR FUSION  2015  . SAVORY DILATION N/A 05/28/2014   Procedure: SAVORY DILATION;  Surgeon: Daneil Dolin, MD;  Location: [facility] ENDO SUITE;  Service: Endoscopy;  Laterality: N/A;  . SHOULDER OPEN ROTATOR CUFF REPAIR Left early 2000s  . small bowel capsule study  07/2011   ?transient focal ischemia in distal small bowel to explain GI bleeding  . TRANSTHORACIC ECHOCARDIOGRAM  09/2008   EF 60-65%, mod conc LVH  . TRANSURETHRAL RESECTION OF PROSTATE    . TRANSURETHRAL RESECTION OF PROSTATE N/A 06/18/2013   Procedure: TRANSURETHRAL RESECTION OF THE PROSTATE (TURP);  Surgeon: Marissa Nestle, MD;  Location: [facility] ORS;  Service: Urology;  Laterality: N/A;     reports that he quit smoking about 43 years ago. His smoking use included Cigars. He started smoking about 56 years ago. He quit after 8.00 years of use. He has never used smokeless tobacco. He reports that he does not drink alcohol or use drugs.  Allergies  Allergen Reactions  . Aspirin Other (See Comments)    Nausea and upset stomach    . Nexium [Esomeprazole Magnesium] Other (See Comments)    Patient said it messed his stomach up    Family History  Problem Relation Age of Onset  . Colon cancer Father 9    deceased  . Prostate cancer Father   . Pancreatic cancer Mother 52    deceased  . Prostate cancer Brother   . Arthritis Son   . Arthritis Son   . Diabetes Mellitus II Son      Prior to Admission medications     Medication Sig Start Date End Date Taking? Authorizing Provider  acetaminophen (TYLENOL) 325 MG tablet Take 650 mg by mouth every 6 (six) hours as needed.   Yes Historical Provider, MD  amLODipine (NORVASC) 5 MG tablet Take 10 mg by mouth daily.    Yes Historical Provider, MD  bisacodyl (DULCOLAX) 5 MG EC tablet Take 1 tablet (5 mg total) by mouth daily. 03/26/16  Yes Thurnell Lose, MD  brimonidine (ALPHAGAN) 0.2 % ophthalmic solution Place 1 drop into the right eye 2 times daily. 04/11/16 04/11/17 Yes Historical Provider, MD  calcium citrate (CALCITRATE - DOSED IN MG ELEMENTAL CALCIUM) 950 MG tablet Take 200 mg of elemental calcium by mouth daily. Takes 3 times per week   Yes Historical Provider, MD  Cholecalciferol (VITAMIN D3)  1000 units CAPS Take 1 capsule by mouth. Takes 3 times per week   Yes Historical Provider, MD  dexlansoprazole (DEXILANT) 60 MG capsule Take 1 capsule (60 mg total) by mouth daily. 08/16/16  Yes Carlis Stable, NP  docusate sodium (COLACE) 100 MG capsule Take 200 mg by mouth daily as needed for mild constipation. Reported on 09/11/2015   Yes Historical Provider, MD  dorzolamide-timolol (COSOPT) 22.3-6.8 MG/ML ophthalmic solution Place 1 drop into both eyes 2 (two) times daily. 12/17/14  Yes Historical Provider, MD  ferrous sulfate 325 (65 FE) MG tablet Take 325 mg by mouth daily with breakfast.   Yes Historical Provider, MD  Ginger, Zingiber officinalis, (GINGER ROOT PO) Take 1 tablet by mouth as needed.    Yes Historical Provider, MD  latanoprost (XALATAN) 0.005 % ophthalmic solution Place 1 drop into both eyes at bedtime.   Yes Historical Provider, MD  magnesium oxide (MAG-OX) 400 MG tablet Take 400 mg by mouth daily. Takes 2 times per week   Yes Historical Provider, MD  metolazone (ZAROXOLYN) 2.5 MG tablet Take 1 tablet (2.5 mg total) by mouth every Wednesday. 05/11/16  Yes Arnoldo Lenis, MD  metoprolol tartrate (LOPRESSOR) 25 MG tablet Take 1 tablet (25 mg total) by  mouth daily. 04/08/16  Yes Lendon Colonel, NP  Oxycodone HCl 10 MG TABS Take 10 mg by mouth 2 (two) times daily as needed (pain).    Yes Historical Provider, MD  pantoprazole (PROTONIX) 40 MG tablet Take 40 mg by mouth 2 (two) times daily.   Yes Historical Provider, MD  Polyethyl Glycol-Propyl Glycol (LUBRICANT EYE DROPS) 0.4-0.3 % SOLN Apply 1 drop to eye daily as needed (for lubricant eye drops).   Yes Historical Provider, MD  potassium chloride SA (K-DUR,KLOR-CON) 20 MEQ tablet Take 1 tablet (20 mEq total) by mouth 2 (two) times daily. Patient taking differently: Take 20 mEq by mouth 4 (four) times daily.  04/08/16  Yes Lendon Colonel, NP  tiZANidine (ZANAFLEX) 4 MG tablet Take 4 mg by mouth 2 (two) times daily.    Yes Historical Provider, MD  torsemide (DEMADEX) 20 MG tablet Take 2 tablets (40 mg total) by mouth 2 (two) times daily. 07/15/16  Yes Arnoldo Lenis, MD  HYDROmorphone (DILAUDID) 4 MG tablet Take 1 tablet (4 mg total) by mouth every 6 (six) hours as needed for severe pain. Patient not taking: Reported on 09/12/2016 07/28/16   Frederica Kuster, PA-C    Physical Exam: Vitals:   09/12/16 1200 09/12/16 1209 09/12/16 1300 09/12/16 1328  BP: 133/80  119/67 121/73  Pulse: 86  85 85  Resp: (!) 22  18 18   Temp:  97.9 F (36.6 C)  97.9 F (36.6 C)  TempSrc:    Oral  SpO2: 93%  98% 95%  Weight:    119.1 kg (262 lb 9.1 oz)  Height:    5\' 6"  (1.676 m)    Constitutional: NAD, calm, comfortable Vitals:   09/12/16 1200 09/12/16 1209 09/12/16 1300 09/12/16 1328  BP: 133/80  119/67 121/73  Pulse: 86  85 85  Resp: (!) 22  18 18   Temp:  97.9 F (36.6 C)  97.9 F (36.6 C)  TempSrc:    Oral  SpO2: 93%  98% 95%  Weight:    119.1 kg (262 lb 9.1 oz)  Height:    5\' 6"  (1.676 m)   Eyes: PERRL, lids and conjunctivae normal ENMT: Mucous membranes are moist. Posterior pharynx clear of  any exudate or lesions.Normal dentition.  Neck: normal, supple, no masses, no  thyromegaly Respiratory: clear to auscultation bilaterally, no wheezing, no crackles. Normal respiratory effort. No accessory muscle use.  Cardiovascular: Regular rate and rhythm, no murmurs / rubs / gallops. 1+extremity edema. 2+ pedal pulses. No carotid bruits.  Abdomen: no tenderness, no masses palpated. No hepatosplenomegaly. Bowel sounds positive.  Musculoskeletal: no clubbing / cyanosis. No joint deformity upper and lower extremities. Good ROM, no contractures. Normal muscle tone.  Skin: no rashes, lesions, ulcers. No induration Neurologic: CN 2-12 grossly intact. Sensation intact, DTR normal. Strength 5/5 in all 4.  Psychiatric: Normal judgment and insight. Alert and oriented x 3. Normal mood.   Labs on Admission: I have personally reviewed following labs and imaging studies  CBC:  Recent Labs Lab 09/12/16 1029 09/12/16 1040  WBC 15.6*  --   NEUTROABS 13.3*  --   HGB 16.6 17.3*  HCT 48.4 51.0  MCV 76.1*  --   PLT 141*  --    Basic Metabolic Panel:  Recent Labs Lab 09/12/16 1029 09/12/16 1040  NA 135 136  K 2.4* 2.4*  CL 86* 87*  CO2 37*  --   GLUCOSE 153* 151*  BUN 32* 32*  CREATININE 1.41* 1.60*  CALCIUM 10.0  --    GFR: Estimated Creatinine Clearance: 47.7 mL/min (A) (by C-G formula based on SCr of 1.6 mg/dL (H)). Liver Function Tests:  Recent Labs Lab 09/12/16 1029  AST 21  ALT 18  ALKPHOS 67  BILITOT 1.3*  PROT 7.0  ALBUMIN 3.7   No results for input(s): LIPASE, AMYLASE in the last 168 hours. No results for input(s): AMMONIA in the last 168 hours. Coagulation Profile:  Recent Labs Lab 09/12/16 1029  INR 0.94   Cardiac Enzymes: No results for input(s): CKTOTAL, CKMB, CKMBINDEX, TROPONINI in the last 168 hours. BNP (last 3 results) No results for input(s): PROBNP in the last 8760 hours. HbA1C: No results for input(s): HGBA1C in the last 72 hours. CBG:  Recent Labs Lab 09/12/16 1023  GLUCAP 162*   Lipid Profile: No results for  input(s): CHOL, HDL, LDLCALC, TRIG, CHOLHDL, LDLDIRECT in the last 72 hours. Thyroid Function Tests: No results for input(s): TSH, T4TOTAL, FREET4, T3FREE, THYROIDAB in the last 72 hours. Anemia Panel: No results for input(s): VITAMINB12, FOLATE, FERRITIN, TIBC, IRON, RETICCTPCT in the last 72 hours. Urine analysis:    Component Value Date/Time   COLORURINE YELLOW 09/12/2016 1050   APPEARANCEUR CLEAR 09/12/2016 1050   LABSPEC 1.008 09/12/2016 1050   PHURINE 7.0 09/12/2016 1050   GLUCOSEU NEGATIVE 09/12/2016 1050   HGBUR NEGATIVE 09/12/2016 1050   BILIRUBINUR NEGATIVE 09/12/2016 1050   KETONESUR NEGATIVE 09/12/2016 1050   PROTEINUR NEGATIVE 09/12/2016 1050   UROBILINOGEN 0.2 09/01/2013 1312   NITRITE NEGATIVE 09/12/2016 1050   LEUKOCYTESUR NEGATIVE 09/12/2016 1050    Radiological Exams on Admission: Dg Chest 2 View  Result Date: 09/12/2016 CLINICAL DATA:  Dizziness, weakness.  Syncope. EXAM: CHEST  2 VIEW COMPARISON:  03/22/2016 FINDINGS: Low lung volumes. No confluent opacities or effusions. Heart is normal size. No acute bony abnormality. IMPRESSION: Low lung volumes.  No acute findings. Electronically Signed   By: Rolm Baptise M.D.   On: 09/12/2016 11:51   Ct Head Wo Contrast  Result Date: 09/12/2016 CLINICAL DATA:  Syncope, dizziness EXAM: CT HEAD WITHOUT CONTRAST TECHNIQUE: Contiguous axial images were obtained from the base of the skull through the vertex without intravenous contrast. COMPARISON:  03/22/2016 FINDINGS: Brain:  No evidence of acute infarction, hemorrhage, hydrocephalus, extra-axial collection or mass lesion/mass effect. Mild age related atrophy. Vascular: No hyperdense vessel or unexpected calcification. Skull: Normal. Negative for fracture or focal lesion. Sinuses/Orbits: The visualized paranasal sinuses are essentially clear. The mastoid air cells are unopacified. Other: None. IMPRESSION: Normal head CT. Electronically Signed   By: Julian Hy M.D.   On:  09/12/2016 12:11    EKG: Independently reviewed. Sinus rhythm  Assessment/Plan Active Problems:   GASTROESOPHAGEAL REFLUX DISEASE, CHRONIC   Hypothyroidism   Hypokalemia   Chronic diastolic CHF (congestive heart failure) (HCC)   Syncope     1. Syncope. Suspect this is related to orthostasis and dehydration. He is on large doses of diuretics. We'll hold diuretics for now and start on gentle hydration. Cycle cardiac markers and check 2D echocardiogram. Since he does have some paraesthesias, will check MRI brain  2. Hypokalemia. Likely related to diuretics. Replace. Check Mg  3. Hypothyroidism. Continue synthroid. Check TSH  4. Chronic diastolic CHF. Appears compensated. Hold on diuretics for now.  5. HTN. Stable. Continue outpatient regimen.  6. GERD. Continue on PPI  DVT prophylaxis: lovenox Code Status: DNR Family Communication: discussed with wife at the bedside Disposition Plan: discharge home once improved Consults called:  Admission status: observation, telemetry   Conception Doebler MD [facility] Pager [phone]  If 7PM-7AM, please contact night-coverage [email or web address] Password [facility]  09/12/2016, 5:08 PM

## 2016-09-12 NOTE — ED Notes (Signed)
CRITICAL VALUE ALERT  Critical value received:  Potassium 2.4  Date of notification:  09/12/16  Time of notification:  1103  Critical value read back:Yes.    Nurse who received alert:  Charmayne Sheer, RN  MD notified (1st page):  [place]

## 2016-09-12 NOTE — Progress Notes (Addendum)
Pt admitted earlier today by [facility] with CC: syncope. Pt's other complaint was parasthesias in the LLE. CT head neg for acute. MRI brain without contrast resulted tonight as "punctate focus with low diffusion right posterior limb of the internal capsule... tiny acute infarct." Chart reviewed by NP. NP spoke with RN who states pt has no c/o other than numbness and tingling in LLE and HA. Neuro exam without focal deficits.  Pt does have a hx of thrombocytopenia, chronic since 2009. Pt sees hemoc and was last seen 08/09/16 and condition labeled as very stable. Pt has intolerance to po ASA. NP tried to call neuro on call for [facility] x 2 without success. NP spoke with Dr. Lorin Mercy of [facility]. Given pt's symptoms and multiple risk factors for stroke, will give ASA 300mg  supp tonight. Neuro can be called again in am.  KJKG, NP [facility] After above note, spoke with neuro at [facility] who agrees with ASA. KJKG, NP [facility]

## 2016-09-12 NOTE — ED Notes (Addendum)
Unable to do orthostatic VS. When moving from a lying to sitting position pt became dizzy. MD made aware.

## 2016-09-12 NOTE — ED Triage Notes (Signed)
EDP notified, Dr. Dayna Barker at bedside.

## 2016-09-12 NOTE — ED Notes (Signed)
Report given to Lisa, RN on 300.  

## 2016-09-12 NOTE — ED Provider Notes (Signed)
[facility] DEPT Provider Note   CSN: [id number] Arrival date & time: 09/12/16  1000  By signing my name below, I, Mayer Masker, attest that this documentation has been prepared under the direction and in the presence of Merrily Pew, MD. Electronically Signed: Mayer Masker, Scribe. 09/12/16. 11:47 AM.  History   Chief Complaint Chief Complaint  Patient presents with  . Loss of Consciousness   HPI Comments: [name] [name] is a 76 y.o. male who presents to the Emergency Department complaining of dizziness and syncope. He states he passed out twice today, first at 6 am when he woke up to urinate and upon standing, felt the room spinning and "passed out". Then at 7 am he had another episode of "passing out" and had difficulty speaking. Difficulty speaking has resolved at this time. He states he has associated stinging and pain in his lower left leg and 3 of his left toes are numb, intermittent chest discomfort on the left side that he describes as a tightness, and a HA. He states he got a shot in his back and his knee on 09-07-16. He denies fever, cough, rhinorrhea, and acute vision changes from baseline. He  denies dizziness at this time. Patient also denies ETOH, tobacco, and ilicit drug use. He has a Hx of cataract and glaucoma in his left eye, and CHF; denies h/o coronary stents. He takes 1-2 oxycontin daily for pain.  The history is provided by the patient. No language interpreter was used.    Past Medical History:  Diagnosis Date  . Bilateral lower leg cellulitis   . BPH (benign prostatic hyperplasia)   . Cataract    left  . CHF (congestive heart failure) ([place])   . Chronic diastolic HF (heart failure) ([place])   . Collagen vascular disease ([place])    history of venous insufficency and LE cellulitis  . Complication of anesthesia    2012 due to sleep apnea pt has had difficulty being put under  and waking up from anesthesia  . Deep venous insufficiency   . GERD (gastroesophageal  reflux disease)   . Glaucoma    left  . Headache   . Heel spur    right heel  . History of bladder infections   . History of colonic polyps   . Hyperlipidemia   . Hypertension    Sees Dr. Debara Pickett  . Hypothyroidism    hypothyroid denies no rx  . Iron deficiency 07/07/2016  . Kidney stones    passed them  . Obesity   . Osteoarthritis   . PONV (postoperative nausea and vomiting)   . PUD (peptic ulcer disease)   . Ringing in ears   . S/P colonoscopy 2007, Feb and March 2012   hx of adenomas, left-sided diverticula, On 07/2010 TCS, fresh blood and clot noted coming from TI.  . S/P endoscopy 2007, 2012   2007: nl, May 2012: antral erosions  . SIRS (systemic inflammatory response syndrome) ([place]) 02/27/2012  . Sleep apnea    uses cpap  occ "unable to use lately due to nasal pain when use" - sleep study 2008 AHI 48.80/hr and 56.70hr during REM  . Thrombocytopenia ([place]) 12/30/2011   Stable    Patient Active Problem List   Diagnosis Date Noted  . Syncope 09/12/2016  . Iron deficiency 07/07/2016  . Acute on chronic diastolic congestive heart failure ([place]) 03/23/2016  . Back pain 03/22/2016  . Acute right-sided low back pain with right-sided sciatica   . S/P nasal septoplasty 10/07/2015  .  Parotitis 03/06/2015  . Chronic diastolic CHF (congestive heart failure) ([place]) 03/06/2015  . Parotitis, acute 03/06/2015  . Injection site irritation 03/06/2015  . Nausea without vomiting 12/08/2014  . Dysphagia, pharyngoesophageal phase   . History of colonic polyps   . Hypokalemia 09/01/2013  . Dehydration 09/01/2013  . Chest pain 09/01/2013  . Leukocytosis 09/01/2013  . Right-sided heart failure ([place]) 08/20/2013  . Acute on chronic diastolic heart failure ([place]) 08/20/2013  . Acute on chronic diastolic CHF 56/38/7564  . Diastolic dysfunction by echo Dec 2014 07/30/2013  . Morbid obesity- BMI 44.5 07/30/2013  . Edema of both legs 07/30/2013  . Bilateral lower leg cellulitis 07/18/2013  .  Pre-operative cardiovascular examination 06/12/2013  . 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 Right heart failure ([place]) 11/27/2012  . OSA (obstructive sleep apnea) 11/27/2012  . Other spondylosis with radiculopathy, lumbar region 08/22/2012  . Bright red blood per rectum 06/15/2012  . Constipation 02/29/2012  . Edema 02/29/2012  . SIRS (systemic inflammatory response syndrome) ([place]) 02/27/2012  . UTI (lower urinary tract infection) 02/27/2012  . BPH (benign prostatic hyperplasia) 02/27/2012  . Obesity 02/27/2012  . Hyperlipidemia 02/27/2012  . Thrombocytopenia ([place]) 12/30/2011  . Hypothyroidism 10/27/2011  . Upper abdominal pain 10/27/2011  . Anorexia 10/27/2011  . Bowel habit changes 10/27/2011  . Constipation 04/20/2011  . Rectal bleeding 11/16/2010  . NAUSEA 06/08/2010  . DIARRHEA 06/08/2010  . COLONIC POLYPS, ADENOMATOUS, HX OF 07/16/2009  . Essential hypertension 06/19/2009  . GASTROESOPHAGEAL REFLUX DISEASE, CHRONIC 06/19/2009  . FATTY LIVER DISEASE 06/19/2009  . CHOLELITHIASIS, SYMPTOMATIC 06/19/2009  . RENAL INSUFFICIENCY 06/19/2009  . ABDOMINAL BLOATING 06/19/2009    Past Surgical History:  Procedure Laterality Date  . BACK SURGERY    . CARDIAC CATHETERIZATION  02/27/2007   no sign of CAD, LVH, mod pulm HTN (Dr. Jackie Plum(  . CATARACT EXTRACTION W/ INTRAOCULAR LENS IMPLANT Right ~ 2012  . COLONOSCOPY N/A 05/28/2014   PPI:RJJOACZY colonic polyps/colonic diverticulosis. Tubular adenomas. Next colonoscopy January 2021  . ESOPHAGOGASTRODUODENOSCOPY  Aug 2013   [facility], single 24mm pedunculated polyp otherwise normal. HYPERPLASTIC, NEGATIVE h.pylori  . ESOPHAGOGASTRODUODENOSCOPY N/A 05/28/2014   RMR:non-critical Schatzki's ring/HH. Benign gastritis  . INGUINAL HERNIA REPAIR Right 1941  . KNEE ARTHROSCOPY Left ~ 2000  . LAPAROSCOPIC CHOLECYSTECTOMY    . [place] SURGERY  2012  . MALONEY DILATION N/A 05/28/2014   Procedure: Venia Minks DILATION;  Surgeon: Daneil Dolin,  MD;  Location: [facility];  Service: Endoscopy;  Laterality: N/A;  . NASAL SEPTOPLASTY W/ TURBINOPLASTY Bilateral 10/07/2015   Procedure: NASAL SEPTOPLASTY WITH TURBINATE REDUCTION;  Surgeon: Leta Baptist, MD;  Location: [facility];  Service: ENT;  Laterality: Bilateral;  . NASAL SEPTUM SURGERY Bilateral 10/07/2015    inferior turbinate resection  . NM MYOCAR PERF WALL MOTION  12/2006   dipyridamole myoview - stress images show mild perfusion defect in mid inferior walls with reversibility at rest; mild perfusion defect in mid inferolateral wall at stress with mild defect reversibility, EF 62%, abnormal but low risk study   . POSTERIOR LUMBAR FUSION  2015  . SAVORY DILATION N/A 05/28/2014   Procedure: SAVORY DILATION;  Surgeon: Daneil Dolin, MD;  Location: [facility];  Service: Endoscopy;  Laterality: N/A;  . SHOULDER OPEN ROTATOR CUFF REPAIR Left early 2000s  . small bowel capsule study  07/2011   ?transient focal ischemia in distal small bowel to explain GI bleeding  . TRANSTHORACIC ECHOCARDIOGRAM  09/2008  EF 60-65%, mod conc LVH  . TRANSURETHRAL RESECTION OF PROSTATE    . TRANSURETHRAL RESECTION OF PROSTATE N/A 06/18/2013   Procedure: TRANSURETHRAL RESECTION OF THE PROSTATE (TURP);  Surgeon: Marissa Nestle, MD;  Location: [facility];  Service: Urology;  Laterality: N/A;       Home Medications    Prior to Admission medications   Medication Sig Start Date End Date Taking? Authorizing Provider  acetaminophen (TYLENOL) 325 MG tablet Take 650 mg by mouth every 6 (six) hours as needed.   Yes Historical Provider, MD  amLODipine (NORVASC) 5 MG tablet Take 10 mg by mouth daily.    Yes Historical Provider, MD  bisacodyl (DULCOLAX) 5 MG EC tablet Take 1 tablet (5 mg total) by mouth daily. 03/26/16  Yes Thurnell Lose, MD  brimonidine (ALPHAGAN) 0.2 % ophthalmic solution Place 1 drop into the right eye 2 times daily. 04/11/16 04/11/17 Yes Historical Provider, MD  calcium citrate (CALCITRATE - DOSED IN  MG ELEMENTAL CALCIUM) 950 MG tablet Take 200 mg of elemental calcium by mouth daily. Takes 3 times per week   Yes Historical Provider, MD  Cholecalciferol (VITAMIN D3) 1000 units CAPS Take 1 capsule by mouth. Takes 3 times per week   Yes Historical Provider, MD  dexlansoprazole (DEXILANT) 60 MG capsule Take 1 capsule (60 mg total) by mouth daily. 08/16/16  Yes Carlis Stable, NP  docusate sodium (COLACE) 100 MG capsule Take 200 mg by mouth daily as needed for mild constipation. Reported on 09/11/2015   Yes Historical Provider, MD  dorzolamide-timolol (COSOPT) 22.3-6.8 MG/ML ophthalmic solution Place 1 drop into both eyes 2 (two) times daily. 12/17/14  Yes Historical Provider, MD  ferrous sulfate 325 (65 FE) MG tablet Take 325 mg by mouth daily with breakfast.   Yes Historical Provider, MD  Ginger, Zingiber officinalis, (GINGER ROOT PO) Take 1 tablet by mouth as needed.    Yes Historical Provider, MD  latanoprost (XALATAN) 0.005 % ophthalmic solution Place 1 drop into both eyes at bedtime.   Yes Historical Provider, MD  magnesium oxide (MAG-OX) 400 MG tablet Take 400 mg by mouth daily. Takes 2 times per week   Yes Historical Provider, MD  metolazone (ZAROXOLYN) 2.5 MG tablet Take 1 tablet (2.5 mg total) by mouth every Wednesday. 05/11/16  Yes Arnoldo Lenis, MD  metoprolol tartrate (LOPRESSOR) 25 MG tablet Take 1 tablet (25 mg total) by mouth daily. 04/08/16  Yes Lendon Colonel, NP  Oxycodone HCl 10 MG TABS Take 10 mg by mouth 2 (two) times daily as needed (pain).    Yes Historical Provider, MD  pantoprazole (PROTONIX) 40 MG tablet Take 40 mg by mouth 2 (two) times daily.   Yes Historical Provider, MD  Polyethyl Glycol-Propyl Glycol (LUBRICANT EYE DROPS) 0.4-0.3 % SOLN Apply 1 drop to eye daily as needed (for lubricant eye drops).   Yes Historical Provider, MD  potassium chloride SA (K-DUR,KLOR-CON) 20 MEQ tablet Take 1 tablet (20 mEq total) by mouth 2 (two) times daily. Patient taking differently:  Take 20 mEq by mouth 4 (four) times daily.  04/08/16  Yes Lendon Colonel, NP  tiZANidine (ZANAFLEX) 4 MG tablet Take 4 mg by mouth 2 (two) times daily.    Yes Historical Provider, MD  torsemide (DEMADEX) 20 MG tablet Take 2 tablets (40 mg total) by mouth 2 (two) times daily. 07/15/16  Yes Arnoldo Lenis, MD  HYDROmorphone (DILAUDID) 4 MG tablet Take 1 tablet (4 mg total) by mouth every  6 (six) hours as needed for severe pain. Patient not taking: Reported on 09/12/2016 07/28/16   Frederica Kuster, PA-C    Family History Family History  Problem Relation Age of Onset  . Colon cancer Father 38    deceased  . Prostate cancer Father   . Pancreatic cancer Mother 30    deceased  . Prostate cancer Brother   . Arthritis Son   . Arthritis Son   . Diabetes Mellitus II Son     Social History Social History  Substance Use Topics  . Smoking status: Former Smoker    Years: 8.00    Types: Cigars    Start date: 11/20/1959    Quit date: 08/10/1973  . Smokeless tobacco: Never Used  . Alcohol use No     Comment: denies     Allergies   Aspirin and Nexium [esomeprazole magnesium]   Review of Systems Review of Systems  Constitutional: Negative for fever.  HENT: Negative for rhinorrhea.   Eyes: Negative for visual disturbance (from baseline).  Respiratory: Negative for cough.   Cardiovascular: Positive for chest pain.  Neurological: Positive for dizziness, syncope and headaches.  All other systems reviewed and are negative.    Physical Exam Updated Vital Signs BP 133/80   Pulse 86   Temp 97.9 F (36.6 C)   Resp (!) 22   Ht 5\' 6"  (1.676 m)   Wt 275 lb (124.7 kg)   SpO2 93%   BMI 44.39 kg/m   Physical Exam  Constitutional: He is oriented to person, place, and time. He appears well-developed and well-nourished. No distress.  HENT:  Head: Normocephalic and atraumatic.  Right Ear: Tympanic membrane and external ear normal.  Left Ear: Tympanic membrane and external ear normal.    Eyes: Conjunctivae are normal.  Cardiovascular: Normal rate, regular rhythm and normal heart sounds.   Pulmonary/Chest: Effort normal and breath sounds normal. No respiratory distress. He has no wheezes. He has no rales.  Abdominal: He exhibits no distension.  Musculoskeletal:  There is BLE edema that is non-pitting.   Neurological: He is alert and oriented to person, place, and time. No cranial nerve deficit.  Grossly Cranial nerves intact Decreased sensation to LLE that is chronic Upper extremities have symmetric strength and sensation No vertigo with head movement Dysmetria   Skin: Skin is warm and dry.  Psychiatric: He has a normal mood and affect.  Nursing note and vitals reviewed.   ED Treatments / Results  DIAGNOSTIC STUDIES: Oxygen Saturation is 95% on RA, normal by my interpretation.    COORDINATION OF CARE: 10:27 AM Discussed treatment plan with pt at bedside and pt agreed to plan.   Labs (all labs ordered are listed, but only abnormal results are displayed) Labs Reviewed  CBC - Abnormal; Notable for the following:       Result Value   WBC 15.6 (*)    RBC 6.36 (*)    MCV 76.1 (*)    RDW 19.6 (*)    Platelets 141 (*)    All other components within normal limits  DIFFERENTIAL - Abnormal; Notable for the following:    Neutro Abs 13.3 (*)    All other components within normal limits  COMPREHENSIVE METABOLIC PANEL - Abnormal; Notable for the following:    Potassium 2.4 (*)    Chloride 86 (*)    CO2 37 (*)    Glucose, Bld 153 (*)    BUN 32 (*)    Creatinine, Ser 1.41 (*)  Total Bilirubin 1.3 (*)    GFR calc non Af Amer 47 (*)    GFR calc Af Amer 54 (*)    All other components within normal limits  CBG MONITORING, ED - Abnormal; Notable for the following:    Glucose-Capillary 162 (*)    All other components within normal limits  I-STAT CHEM 8, ED - Abnormal; Notable for the following:    Potassium 2.4 (*)    Chloride 87 (*)    BUN 32 (*)    Creatinine,  Ser 1.60 (*)    Glucose, Bld 151 (*)    Hemoglobin 17.3 (*)    All other components within normal limits  ETHANOL  PROTIME-INR  APTT  RAPID URINE DRUG SCREEN, HOSP PERFORMED  URINALYSIS, ROUTINE W REFLEX MICROSCOPIC  I-STAT TROPOININ, ED    EKG  EKG Interpretation None       Radiology Dg Chest 2 View  Result Date: 09/12/2016 CLINICAL DATA:  Dizziness, weakness.  Syncope. EXAM: CHEST  2 VIEW COMPARISON:  03/22/2016 FINDINGS: Low lung volumes. No confluent opacities or effusions. Heart is normal size. No acute bony abnormality. IMPRESSION: Low lung volumes.  No acute findings. Electronically Signed   By: Rolm Baptise M.D.   On: 09/12/2016 11:51   Ct Head Wo Contrast  Result Date: 09/12/2016 CLINICAL DATA:  Syncope, dizziness EXAM: CT HEAD WITHOUT CONTRAST TECHNIQUE: Contiguous axial images were obtained from the base of the skull through the vertex without intravenous contrast. COMPARISON:  03/22/2016 FINDINGS: Brain: No evidence of acute infarction, hemorrhage, hydrocephalus, extra-axial collection or mass lesion/mass effect. Mild age related atrophy. Vascular: No hyperdense vessel or unexpected calcification. Skull: Normal. Negative for fracture or focal lesion. Sinuses/Orbits: The visualized paranasal sinuses are essentially clear. The mastoid air cells are unopacified. Other: None. IMPRESSION: Normal head CT. Electronically Signed   By: Julian Hy M.D.   On: 09/12/2016 12:11    Procedures Procedures (including critical care time)  Medications Ordered in ED Medications  potassium chloride 10 mEq in 100 mL IVPB (not administered)  magnesium sulfate IVPB 2 g 50 mL (2 g Intravenous New Bag/Given 09/12/16 1214)  sodium chloride 0.9 % bolus 1,000 mL (1,000 mLs Intravenous New Bag/Given 09/12/16 1054)  potassium chloride SA (K-DUR,KLOR-CON) CR tablet 40 mEq (40 mEq Oral Given 09/12/16 1214)     Initial Impression / Assessment and Plan / ED Course  I have reviewed the triage  vital signs and the nursing notes.  Pertinent labs & imaging results that were available during my care of the patient were reviewed by me and considered in my medical decision making (see chart for details).     Last known normal was last night when he went to bed. This morning embolus as vertigo and syncope. No palpitations associated with that. Only new neruo finding is dysmetria.  Found to be hypokalemic - K, bolus and Mg given - no obvious ecg changes Could still be cardiac vs electrolyte related v arrhythmia v neurologic - discussed with medicine who will admit for further workup and management.    Final Clinical Impressions(s) / ED Diagnoses   Final diagnoses:  Vertigo  Syncope, unspecified syncope type    New Prescriptions New Prescriptions   No medications on file  I personally performed the services described in this documentation, which was scribed in my presence. The recorded information has been reviewed and is accurate.     Merrily Pew, MD 09/12/16 1248

## 2016-09-13 ENCOUNTER — Observation Stay (HOSPITAL_COMMUNITY): Payer: PPO

## 2016-09-13 ENCOUNTER — Observation Stay (HOSPITAL_BASED_OUTPATIENT_CLINIC_OR_DEPARTMENT_OTHER): Payer: PPO

## 2016-09-13 DIAGNOSIS — K277 Chronic peptic ulcer, site unspecified, without hemorrhage or perforation: Secondary | ICD-10-CM | POA: Diagnosis not present

## 2016-09-13 DIAGNOSIS — Z981 Arthrodesis status: Secondary | ICD-10-CM | POA: Diagnosis not present

## 2016-09-13 DIAGNOSIS — R55 Syncope and collapse: Secondary | ICD-10-CM | POA: Diagnosis not present

## 2016-09-13 DIAGNOSIS — E039 Hypothyroidism, unspecified: Secondary | ICD-10-CM | POA: Diagnosis not present

## 2016-09-13 DIAGNOSIS — R42 Dizziness and giddiness: Secondary | ICD-10-CM | POA: Diagnosis not present

## 2016-09-13 DIAGNOSIS — N179 Acute kidney failure, unspecified: Secondary | ICD-10-CM | POA: Diagnosis present

## 2016-09-13 DIAGNOSIS — E876 Hypokalemia: Secondary | ICD-10-CM | POA: Diagnosis not present

## 2016-09-13 DIAGNOSIS — D72829 Elevated white blood cell count, unspecified: Secondary | ICD-10-CM | POA: Diagnosis not present

## 2016-09-13 DIAGNOSIS — R51 Headache: Secondary | ICD-10-CM | POA: Diagnosis not present

## 2016-09-13 DIAGNOSIS — I5032 Chronic diastolic (congestive) heart failure: Secondary | ICD-10-CM | POA: Diagnosis not present

## 2016-09-13 DIAGNOSIS — K5900 Constipation, unspecified: Secondary | ICD-10-CM | POA: Diagnosis not present

## 2016-09-13 DIAGNOSIS — E785 Hyperlipidemia, unspecified: Secondary | ICD-10-CM | POA: Diagnosis not present

## 2016-09-13 DIAGNOSIS — Z66 Do not resuscitate: Secondary | ICD-10-CM | POA: Diagnosis not present

## 2016-09-13 DIAGNOSIS — M1990 Unspecified osteoarthritis, unspecified site: Secondary | ICD-10-CM | POA: Diagnosis not present

## 2016-09-13 DIAGNOSIS — I10 Essential (primary) hypertension: Secondary | ICD-10-CM | POA: Diagnosis not present

## 2016-09-13 DIAGNOSIS — T502X5A Adverse effect of carbonic-anhydrase inhibitors, benzothiadiazides and other diuretics, initial encounter: Secondary | ICD-10-CM | POA: Diagnosis not present

## 2016-09-13 DIAGNOSIS — K219 Gastro-esophageal reflux disease without esophagitis: Secondary | ICD-10-CM | POA: Diagnosis not present

## 2016-09-13 DIAGNOSIS — D508 Other iron deficiency anemias: Secondary | ICD-10-CM | POA: Diagnosis not present

## 2016-09-13 DIAGNOSIS — M7731 Calcaneal spur, right foot: Secondary | ICD-10-CM | POA: Diagnosis not present

## 2016-09-13 DIAGNOSIS — I872 Venous insufficiency (chronic) (peripheral): Secondary | ICD-10-CM | POA: Diagnosis not present

## 2016-09-13 DIAGNOSIS — G4730 Sleep apnea, unspecified: Secondary | ICD-10-CM | POA: Diagnosis not present

## 2016-09-13 DIAGNOSIS — Z6841 Body Mass Index (BMI) 40.0 and over, adult: Secondary | ICD-10-CM | POA: Diagnosis not present

## 2016-09-13 DIAGNOSIS — H9313 Tinnitus, bilateral: Secondary | ICD-10-CM | POA: Diagnosis not present

## 2016-09-13 DIAGNOSIS — D696 Thrombocytopenia, unspecified: Secondary | ICD-10-CM | POA: Diagnosis not present

## 2016-09-13 DIAGNOSIS — E1165 Type 2 diabetes mellitus with hyperglycemia: Secondary | ICD-10-CM | POA: Diagnosis not present

## 2016-09-13 DIAGNOSIS — I6521 Occlusion and stenosis of right carotid artery: Secondary | ICD-10-CM | POA: Diagnosis not present

## 2016-09-13 DIAGNOSIS — M359 Systemic involvement of connective tissue, unspecified: Secondary | ICD-10-CM | POA: Diagnosis not present

## 2016-09-13 DIAGNOSIS — E1142 Type 2 diabetes mellitus with diabetic polyneuropathy: Secondary | ICD-10-CM | POA: Diagnosis not present

## 2016-09-13 DIAGNOSIS — I951 Orthostatic hypotension: Secondary | ICD-10-CM | POA: Diagnosis not present

## 2016-09-13 DIAGNOSIS — E611 Iron deficiency: Secondary | ICD-10-CM | POA: Diagnosis not present

## 2016-09-13 DIAGNOSIS — H409 Unspecified glaucoma: Secondary | ICD-10-CM | POA: Diagnosis not present

## 2016-09-13 DIAGNOSIS — I639 Cerebral infarction, unspecified: Secondary | ICD-10-CM | POA: Diagnosis present

## 2016-09-13 DIAGNOSIS — E860 Dehydration: Secondary | ICD-10-CM | POA: Diagnosis not present

## 2016-09-13 DIAGNOSIS — G4733 Obstructive sleep apnea (adult) (pediatric): Secondary | ICD-10-CM | POA: Diagnosis not present

## 2016-09-13 DIAGNOSIS — M6281 Muscle weakness (generalized): Secondary | ICD-10-CM | POA: Diagnosis not present

## 2016-09-13 DIAGNOSIS — I110 Hypertensive heart disease with heart failure: Secondary | ICD-10-CM | POA: Diagnosis not present

## 2016-09-13 DIAGNOSIS — W19XXXA Unspecified fall, initial encounter: Secondary | ICD-10-CM | POA: Diagnosis not present

## 2016-09-13 DIAGNOSIS — R269 Unspecified abnormalities of gait and mobility: Secondary | ICD-10-CM | POA: Diagnosis not present

## 2016-09-13 DIAGNOSIS — R202 Paresthesia of skin: Secondary | ICD-10-CM | POA: Diagnosis not present

## 2016-09-13 DIAGNOSIS — R279 Unspecified lack of coordination: Secondary | ICD-10-CM | POA: Diagnosis not present

## 2016-09-13 DIAGNOSIS — N400 Enlarged prostate without lower urinary tract symptoms: Secondary | ICD-10-CM | POA: Diagnosis not present

## 2016-09-13 LAB — ECHOCARDIOGRAM COMPLETE
AV Area VTI index: 0.87 cm2/m2
AV Mean grad: 5 mmHg
AV VEL mean LVOT/AV: 0.85
AV peak Index: 0.88
AV pk vel: 158 cm/s
AV vel: 2.1
AVA: 2.1 cm2
AVAREAMEANV: 2.15 cm2
AVAREAMEANVIN: 0.89 cm2/m2
AVAREAVTI: 2.12 cm2
AVPG: 10 mmHg
Ao pk vel: 0.84 m/s
CHL CUP DOP CALC LVOT VTI: 28.2 cm
CHL CUP STROKE VOLUME: 31 mL
CHL CUP TV REG PEAK VELOCITY: 264 cm/s
DOP CAL AO MEAN VELOCITY: 99.1 cm/s
E decel time: 246 msec
E/e' ratio: 7.14
FS: 33 % (ref 28–44)
HEIGHTINCHES: 66 in
IV/PV OW: 0.98
LA diam end sys: 44 mm
LA diam index: 1.82 cm/m2
LA vol A4C: 28.8 ml
LASIZE: 44 mm
LAVOL: 32 mL
LAVOLIN: 13.2 mL/m2
LV E/e' medial: 7.14
LV SIMPSON'S DISK: 60
LV dias vol: 51 mL — AB (ref 62–150)
LV e' LATERAL: 11.5 cm/s
LV sys vol index: 9 mL/m2
LV sys vol: 21 mL (ref 21–61)
LVDIAVOLIN: 21 mL/m2
LVEEAVG: 7.14
LVOT SV: 72 mL
LVOT area: 2.54 cm2
LVOT peak grad rest: 7 mmHg
LVOT peak vel: 132 cm/s
LVOTD: 18 mm
LVOTVTI: 0.83 cm
Lateral S' vel: 10 cm/s
MV Dec: 246
MV Peak grad: 3 mmHg
MV pk E vel: 82.1 m/s
MVPKAVEL: 86.7 m/s
PW: 12.3 mm — AB (ref 0.6–1.1)
RV TAPSE: 17.1 mm
RV sys press: 31 mmHg
TDI e' lateral: 11.5
TDI e' medial: 8.49
TR max vel: 264 cm/s
VTI: 34.1 cm
Valve area index: 0.87
Weight: 4201.09 oz

## 2016-09-13 LAB — C-REACTIVE PROTEIN: CRP: 1.8 mg/dL — ABNORMAL HIGH (ref <1.0)

## 2016-09-13 LAB — BASIC METABOLIC PANEL
Anion gap: 9 (ref 5–15)
BUN: 25 mg/dL — AB (ref 6–20)
CO2: 32 mmol/L (ref 22–32)
CREATININE: 1.36 mg/dL — AB (ref 0.61–1.24)
Calcium: 8.9 mg/dL (ref 8.9–10.3)
Chloride: 99 mmol/L — ABNORMAL LOW (ref 101–111)
GFR calc Af Amer: 57 mL/min — ABNORMAL LOW (ref >60)
GFR, EST NON AFRICAN AMERICAN: 49 mL/min — AB (ref >60)
Glucose, Bld: 152 mg/dL — ABNORMAL HIGH (ref 65–99)
POTASSIUM: 3.8 mmol/L (ref 3.5–5.1)
SODIUM: 140 mmol/L (ref 135–145)

## 2016-09-13 LAB — GLUCOSE, CAPILLARY
GLUCOSE-CAPILLARY: 156 mg/dL — AB (ref 65–99)
GLUCOSE-CAPILLARY: 143 mg/dL — AB (ref 65–99)
GLUCOSE-CAPILLARY: 198 mg/dL — AB (ref 65–99)
Glucose-Capillary: 133 mg/dL — ABNORMAL HIGH (ref 65–99)

## 2016-09-13 LAB — MAGNESIUM: MAGNESIUM: 2.3 mg/dL (ref 1.7–2.4)

## 2016-09-13 LAB — CBC
HCT: 42.1 % (ref 39.0–52.0)
Hemoglobin: 13.8 g/dL (ref 13.0–17.0)
MCH: 25.2 pg — AB (ref 26.0–34.0)
MCHC: 32.8 g/dL (ref 30.0–36.0)
MCV: 76.8 fL — ABNORMAL LOW (ref 78.0–100.0)
Platelets: 123 10*3/uL — ABNORMAL LOW (ref 150–400)
RBC: 5.48 MIL/uL (ref 4.22–5.81)
RDW: 20.8 % — ABNORMAL HIGH (ref 11.5–15.5)
WBC: 15.1 10*3/uL — ABNORMAL HIGH (ref 4.0–10.5)

## 2016-09-13 LAB — TROPONIN I: Troponin I: <0.03 ng/mL (ref <0.03)

## 2016-09-13 LAB — VITAMIN B12: Vitamin B-12: 589 pg/mL (ref 180–914)

## 2016-09-13 LAB — SEDIMENTATION RATE: Sed Rate: 11 mm/hr (ref 0–16)

## 2016-09-13 MED ORDER — SODIUM CHLORIDE 0.9 % IV BOLUS (SEPSIS)
250.0000 mL | Freq: Once | INTRAVENOUS | Status: AC
  Administered 2016-09-13: 250 mL via INTRAVENOUS

## 2016-09-13 MED ORDER — STROKE: EARLY STAGES OF RECOVERY BOOK
Freq: Once | Status: DC
  Filled 2016-09-13: qty 1

## 2016-09-13 MED ORDER — INSULIN ASPART 100 UNIT/ML ~~LOC~~ SOLN: 0.0000 [IU] | Freq: Every day | SUBCUTANEOUS | Status: DC

## 2016-09-13 MED ORDER — ASPIRIN 325 MG PO TABS
325.0000 mg | ORAL_TABLET | Freq: Every day | ORAL | Status: DC
  Administered 2016-09-13 – 2016-09-14 (×2): 325 mg via ORAL
  Filled 2016-09-13 (×2): qty 1

## 2016-09-13 MED ORDER — STROKE: EARLY STAGES OF RECOVERY BOOK
Freq: Once | Status: AC
  Administered 2016-09-13: 10:00:00
  Filled 2016-09-13: qty 1

## 2016-09-13 MED ORDER — INSULIN ASPART 100 UNIT/ML ~~LOC~~ SOLN
0.0000 [IU] | Freq: Three times a day (TID) | SUBCUTANEOUS | Status: DC
  Administered 2016-09-13 – 2016-09-14 (×3): 2 [IU] via SUBCUTANEOUS

## 2016-09-13 NOTE — Consult Note (Signed)
[place] A. Merlene Laughter, MD     [email or web address]          [name] [name] is an 76 y.o. male.   ASSESSMENT/PLAN: 1. Syncope of unclear etiology: The postural relationship she does possibility of orthostatic hypotension. The patient's symptoms are associated with left-sided headaches. This suggests possibility of vasculitis/arteritis. She does the workup includes orthostatic vitals, initial labs for sedimentation rate, C-reactive protein, vitamin B12 level and thyroid function tests. Consider EEG if workup is unrevealing. 2. Subacute tiny infarct involving the right internal capsule. This is a lacunar syndrome and the likely has been there for 2 weeks commensurate with the patient's symptoms. This likely explains the patient's left lower extremity numbness but likely unrelated to syncope. Aspirin 325 mg daily as recommended. Echocardiography and carotid duplex Doppler is also recommended. Additional labs such as hemoglobin A1c is also recommended. 3. 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 He tells me that he has never had these episodes before. He had about 3 episodes within the same day. He woke up on yesterday with these spells. He reports having significant dizziness which she describes mostly as a spinning like sensation but also with a lightheaded sensation. The 3 spells happen with the patient stood up. Initial event happened after awakening and getting out of bed. The patient apparently blacked out all 3 spells for about 15 minutes. The patient apparently fell back in bed and lost consciousness for the 10-15 minutes. He denies any oral trauma, urinary or bladder incontinence. No associated convulsions are reported. He reports having significant severe left hemicrania headaches. He does report having some associated dyspnea. No chest pain as reported. No clear focal numbness or  weakness associated with these events. However, he tells me he has had numbness involving the left lower extremity for last 2 weeks. The numbness involves the distal legs from the knees down. The patient does not report having associated dysarthria or dysphasia with his recent events or with his recent left-sided numbness. The left upper extremity and left facial regions or not symptomatic. The review systems is otherwise negative.  GENERAL: This is a very pleasant overweight man in no acute distress.  HEENT: Supple. Atraumatic normocephalic.   ABDOMEN: soft  EXTREMITIES: No edema   BACK: Normal.  SKIN: Normal by inspection.    MENTAL STATUS: Alert and oriented. Speech, language and cognition are generally intact. Judgment and insight normal.   CRANIAL NERVES: Pupils are equal, round and reactive to light and accommodation; extra ocular movements are full, there is no significant nystagmus; visual fields are full; upper and lower facial muscles are normal in strength and symmetric, there is no flattening of the nasolabial folds; tongue is midline; uvula is midline; shoulder elevation is normal.  MOTOR: Normal tone, bulk and strength; no pronator drift.  COORDINATION: Left finger to nose is normal, right finger to nose is normal, No rest tremor; no intention tremor; no postural tremor; no bradykinesia.  REFLEXES: Deep tendon reflexes are symmetrical and normal - except the right knee which is slightly diminished. Babinski reflexes are flexor bilaterally.   SENSATION: Reduced sensation to light touch symptoms involving the left lower extremity from the knee distally.    The brain MRI is reviewed in person. There is a tiny signal increase involving the internal capsule on the right side. It is seen only on the coronal scans and also only on one cut. It  is faintly bright indicating some chronicity. No other acute findings are observed. No hemorrhage or encephalomalacia  appreciated.    Blood pressure (!) 100/50, pulse 90, temperature 98.5 F (36.9 C), temperature source Oral, resp. rate 16, height 5\' 6"  (1.676 m), weight 262 lb 9.1 oz (119.1 kg), SpO2 97 %.  Past Medical History:  Diagnosis Date  . Bilateral lower leg cellulitis   . BPH (benign prostatic hyperplasia)   . Cataract    left  . CHF (congestive heart failure) (HCC)   . Chronic diastolic HF (heart failure) (HCC)   . Collagen vascular disease (HCC)    history of venous insufficency and LE cellulitis  . Complication of anesthesia    2012 due to sleep apnea pt has had difficulty being put under  and waking up from anesthesia  . Deep venous insufficiency   . GERD (gastroesophageal reflux disease)   . Glaucoma    left  . Headache   . Heel spur    right heel  . History of bladder infections   . History of colonic polyps   . Hyperlipidemia   . Hypertension    Sees Dr. 2013  . Hypothyroidism    hypothyroid denies no rx  . Iron deficiency 07/07/2016  . Kidney stones    passed them  . Obesity   . Osteoarthritis   . PONV (postoperative nausea and vomiting)   . PUD (peptic ulcer disease)   . Ringing in ears   . S/P colonoscopy 2007, Feb and March 2012   hx of adenomas, left-sided diverticula, On 07/2010 TCS, fresh blood and clot noted coming from TI.  . S/P endoscopy 2007, 2012   2007: nl, May 2012: antral erosions  . SIRS (systemic inflammatory response syndrome) (HCC) 02/27/2012  . Sleep apnea    uses cpap  occ "unable to use lately due to nasal pain when use" - sleep study 2008 AHI 48.80/hr and 56.70hr during REM  . Thrombocytopenia (HCC) 12/30/2011   Stable    Past Surgical History:  Procedure Laterality Date  . BACK SURGERY    . CARDIAC CATHETERIZATION  02/27/2007   no sign of CAD, LVH, mod pulm HTN (Dr. 04/29/2007(  . CATARACT EXTRACTION W/ INTRAOCULAR LENS IMPLANT Right ~ 2012  . COLONOSCOPY N/A 05/28/2014   07/27/2014 colonic polyps/colonic diverticulosis. Tubular adenomas.  Next colonoscopy January 2021  . ESOPHAGOGASTRODUODENOSCOPY  Aug 2013   [facility], single 56mm pedunculated polyp otherwise normal. HYPERPLASTIC, NEGATIVE h.pylori  . ESOPHAGOGASTRODUODENOSCOPY N/A 05/28/2014   RMR:non-critical Schatzki's ring/HH. Benign gastritis  . INGUINAL HERNIA REPAIR Right 1941  . KNEE ARTHROSCOPY Left ~ 2000  . LAPAROSCOPIC CHOLECYSTECTOMY    . LUMBAR DISC SURGERY  2012  . MALONEY DILATION N/A 05/28/2014   Procedure: 07/27/2014 DILATION;  Surgeon: Elease Hashimoto, MD;  Location: [facility];  Service: Endoscopy;  Laterality: N/A;  . NASAL SEPTOPLASTY W/ TURBINOPLASTY Bilateral 10/07/2015   Procedure: NASAL SEPTOPLASTY WITH TURBINATE REDUCTION;  Surgeon: 10/09/2015, MD;  Location: [facility] OR;  Service: ENT;  Laterality: Bilateral;  . NASAL SEPTUM SURGERY Bilateral 10/07/2015    inferior turbinate resection  . NM MYOCAR PERF WALL MOTION  12/2006   dipyridamole myoview - stress images show mild perfusion defect in mid inferior walls with reversibility at rest; mild perfusion defect in mid inferolateral wall at stress with mild defect reversibility, EF 62%, abnormal but low risk study   . POSTERIOR LUMBAR FUSION  2015  . SAVORY DILATION N/A 05/28/2014   Procedure:  SAVORY DILATION;  Surgeon: Daneil Dolin, MD;  Location: [facility];  Service: Endoscopy;  Laterality: N/A;  . SHOULDER OPEN ROTATOR CUFF REPAIR Left early 2000s  . small bowel capsule study  07/2011   ?transient focal ischemia in distal small bowel to explain GI bleeding  . TRANSTHORACIC ECHOCARDIOGRAM  09/2008   EF 60-65%, mod conc LVH  . TRANSURETHRAL RESECTION OF PROSTATE    . TRANSURETHRAL RESECTION OF PROSTATE N/A 06/18/2013   Procedure: TRANSURETHRAL RESECTION OF THE PROSTATE (TURP);  Surgeon: Marissa Nestle, MD;  Location: [facility];  Service: Urology;  Laterality: N/A;    Family History  Problem Relation Age of Onset  . Colon cancer Father 58    deceased  . Prostate cancer Father   . Pancreatic cancer Mother 45     deceased  . Prostate cancer Brother   . Arthritis Son   . Arthritis Son   . Diabetes Mellitus II Son     Social History:  reports that he quit smoking about 43 years ago. His smoking use included Cigars. He started smoking about 56 years ago. He quit after 8.00 years of use. He has never used smokeless tobacco. He reports that he does not drink alcohol or use drugs.  Allergies:  Allergies  Allergen Reactions  . Aspirin Other (See Comments)    Nausea and upset stomach    . Nexium [Esomeprazole Magnesium] Other (See Comments)    Patient said it messed his stomach up    Medications: Prior to Admission medications   Medication Sig Start Date End Date Taking? Authorizing Provider  acetaminophen (TYLENOL) 325 MG tablet Take 650 mg by mouth every 6 (six) hours as needed.   Yes Historical Provider, MD  amLODipine (NORVASC) 5 MG tablet Take 10 mg by mouth daily.    Yes Historical Provider, MD  bisacodyl (DULCOLAX) 5 MG EC tablet Take 1 tablet (5 mg total) by mouth daily. 03/26/16  Yes Thurnell Lose, MD  brimonidine (ALPHAGAN) 0.2 % ophthalmic solution Place 1 drop into the right eye 2 times daily. 04/11/16 04/11/17 Yes Historical Provider, MD  calcium citrate (CALCITRATE - DOSED IN MG ELEMENTAL CALCIUM) 950 MG tablet Take 200 mg of elemental calcium by mouth daily. Takes 3 times per week   Yes Historical Provider, MD  Cholecalciferol (VITAMIN D3) 1000 units CAPS Take 1 capsule by mouth. Takes 3 times per week   Yes Historical Provider, MD  dexlansoprazole (DEXILANT) 60 MG capsule Take 1 capsule (60 mg total) by mouth daily. 08/16/16  Yes Carlis Stable, NP  docusate sodium (COLACE) 100 MG capsule Take 200 mg by mouth daily as needed for mild constipation. Reported on 09/11/2015   Yes Historical Provider, MD  dorzolamide-timolol (COSOPT) 22.3-6.8 MG/ML ophthalmic solution Place 1 drop into both eyes 2 (two) times daily. 12/17/14  Yes Historical Provider, MD  ferrous sulfate 325 (65 FE) MG tablet  Take 325 mg by mouth daily with breakfast.   Yes Historical Provider, MD  Ginger, Zingiber officinalis, (GINGER ROOT PO) Take 1 tablet by mouth as needed.    Yes Historical Provider, MD  latanoprost (XALATAN) 0.005 % ophthalmic solution Place 1 drop into both eyes at bedtime.   Yes Historical Provider, MD  magnesium oxide (MAG-OX) 400 MG tablet Take 400 mg by mouth daily. Takes 2 times per week   Yes Historical Provider, MD  metolazone (ZAROXOLYN) 2.5 MG tablet Take 1 tablet (2.5 mg total) by mouth every Wednesday. 05/11/16  Yes Roderic Palau  Dale Pound, MD  metoprolol tartrate (LOPRESSOR) 25 MG tablet Take 1 tablet (25 mg total) by mouth daily. 04/08/16  Yes Lendon Colonel, NP  Oxycodone HCl 10 MG TABS Take 10 mg by mouth 2 (two) times daily as needed (pain).    Yes Historical Provider, MD  pantoprazole (PROTONIX) 40 MG tablet Take 40 mg by mouth 2 (two) times daily.   Yes Historical Provider, MD  Polyethyl Glycol-Propyl Glycol (LUBRICANT EYE DROPS) 0.4-0.3 % SOLN Apply 1 drop to eye daily as needed (for lubricant eye drops).   Yes Historical Provider, MD  potassium chloride SA (K-DUR,KLOR-CON) 20 MEQ tablet Take 1 tablet (20 mEq total) by mouth 2 (two) times daily. Patient taking differently: Take 20 mEq by mouth 4 (four) times daily.  04/08/16  Yes Lendon Colonel, NP  tiZANidine (ZANAFLEX) 4 MG tablet Take 4 mg by mouth 2 (two) times daily.    Yes Historical Provider, MD  torsemide (DEMADEX) 20 MG tablet Take 2 tablets (40 mg total) by mouth 2 (two) times daily. 07/15/16  Yes Arnoldo Lenis, MD  HYDROmorphone (DILAUDID) 4 MG tablet Take 1 tablet (4 mg total) by mouth every 6 (six) hours as needed for severe pain. Patient not taking: Reported on 09/12/2016 07/28/16   Frederica Kuster, PA-C    Scheduled Meds: . amLODipine  10 mg Oral Daily  . bisacodyl  5 mg Oral Daily  . brimonidine  1 drop Right Eye BID  . dorzolamide-timolol  1 drop Both Eyes BID  . enoxaparin (LOVENOX) injection  40 mg  Subcutaneous Q24H  . ferrous sulfate  325 mg Oral Q breakfast  . latanoprost  1 drop Both Eyes QHS  . metoprolol tartrate  25 mg Oral Daily  . pantoprazole  40 mg Oral BID  . sodium chloride flush  3 mL Intravenous Q12H  . tiZANidine  4 mg Oral BID   Continuous Infusions: . 0.9 % NaCl with KCl 40 mEq / L 100 mL/hr (09/13/16 0324)   PRN Meds:.acetaminophen **OR** acetaminophen, ondansetron **OR** ondansetron (ZOFRAN) IV, oxyCODONE, polyvinyl alcohol     Results for orders placed or performed during the hospital encounter of 09/12/16 (from the past 48 hour(s))  CBG monitoring, ED     Status: Abnormal   Collection Time: 09/12/16 10:23 AM  Result Value Ref Range   Glucose-Capillary 162 (H) 65 - 99 mg/dL  Ethanol     Status: None   Collection Time: 09/12/16 10:29 AM  Result Value Ref Range   Alcohol, Ethyl (B) <5 <5 mg/dL    Comment:        LOWEST DETECTABLE LIMIT FOR SERUM ALCOHOL IS 5 mg/dL FOR MEDICAL PURPOSES ONLY   Protime-INR     Status: None   Collection Time: 09/12/16 10:29 AM  Result Value Ref Range   Prothrombin Time 12.6 11.4 - 15.2 seconds   INR 0.94   APTT     Status: None   Collection Time: 09/12/16 10:29 AM  Result Value Ref Range   aPTT 30 24 - 36 seconds  CBC     Status: Abnormal   Collection Time: 09/12/16 10:29 AM  Result Value Ref Range   WBC 15.6 (H) 4.0 - 10.5 K/uL   RBC 6.36 (H) 4.22 - 5.81 MIL/uL   Hemoglobin 16.6 13.0 - 17.0 g/dL   HCT 48.4 39.0 - 52.0 %   MCV 76.1 (L) 78.0 - 100.0 fL   MCH 26.1 26.0 - 34.0 pg   MCHC 34.3  30.0 - 36.0 g/dL   RDW 39.4 (H) 76.9 - 34.6 %   Platelets 141 (L) 150 - 400 K/uL    Comment: PLATELET COUNT CONFIRMED BY SMEAR SPECIMEN CHECKED FOR CLOTS LARGE PLATELETS PRESENT   Differential     Status: Abnormal   Collection Time: 09/12/16 10:29 AM  Result Value Ref Range   Neutrophils Relative % 85 %   Neutro Abs 13.3 (H) 1.7 - 7.7 K/uL   Lymphocytes Relative 8 %   Lymphs Abs 1.2 0.7 - 4.0 K/uL   Monocytes Relative  6 %   Monocytes Absolute 1.0 0.1 - 1.0 K/uL   Eosinophils Relative 0 %   Eosinophils Absolute 0.1 0.0 - 0.7 K/uL   Basophils Relative 0 %   Basophils Absolute 0.0 0.0 - 0.1 K/uL  Comprehensive metabolic panel     Status: Abnormal   Collection Time: 09/12/16 10:29 AM  Result Value Ref Range   Sodium 135 135 - 145 mmol/L   Potassium 2.4 (LL) 3.5 - 5.1 mmol/L    Comment: CRITICAL RESULT CALLED TO, READ BACK BY AND VERIFIED WITH: ROBINSON,L AT 11:00AM ON 09/12/16 BY FESTERMAN,C    Chloride 86 (L) 101 - 111 mmol/L   CO2 37 (H) 22 - 32 mmol/L   Glucose, Bld 153 (H) 65 - 99 mg/dL   BUN 32 (H) 6 - 20 mg/dL   Creatinine, Ser 2.53 (H) 0.61 - 1.24 mg/dL   Calcium 10.3 8.9 - 37.5 mg/dL   Total Protein 7.0 6.5 - 8.1 g/dL   Albumin 3.7 3.5 - 5.0 g/dL   AST 21 15 - 41 U/L   ALT 18 17 - 63 U/L   Alkaline Phosphatase 67 38 - 126 U/L   Total Bilirubin 1.3 (H) 0.3 - 1.2 mg/dL   GFR calc non Af Amer 47 (L) >60 mL/min   GFR calc Af Amer 54 (L) >60 mL/min    Comment: (NOTE) The eGFR has been calculated using the CKD EPI equation. This calculation has not been validated in all clinical situations. eGFR's persistently <60 mL/min signify possible Chronic Kidney Disease.    Anion gap 12 5 - 15  I-stat troponin, ED (not at [facility], [facility])     Status: None   Collection Time: 09/12/16 10:36 AM  Result Value Ref Range   Troponin i, poc 0.00 0.00 - 0.08 ng/mL   Comment 3            Comment: Due to the release kinetics of cTnI, a negative result within the first hours of the onset of symptoms does not rule out myocardial infarction with certainty. If myocardial infarction is still suspected, repeat the test at appropriate intervals.   I-Stat Chem 8, ED  (not at [facility], [facility])     Status: Abnormal   Collection Time: 09/12/16 10:40 AM  Result Value Ref Range   Sodium 136 135 - 145 mmol/L   Potassium 2.4 (LL) 3.5 - 5.1 mmol/L   Chloride 87 (L) 101 - 111 mmol/L   BUN 32 (H) 6 - 20 mg/dL   Creatinine, Ser  2.61 (H) 0.61 - 1.24 mg/dL   Glucose, Bld 611 (H) 65 - 99 mg/dL   Calcium, Ion 9.63 8.12 - 1.40 mmol/L   TCO2 36 0 - 100 mmol/L   Hemoglobin 17.3 (H) 13.0 - 17.0 g/dL   HCT 03.2 25.2 - 01.3 %  Urine rapid drug screen (hosp performed)not at [facility]     Status: None   Collection Time: 09/12/16 10:50  AM  Result Value Ref Range   Opiates NONE DETECTED NONE DETECTED   Cocaine NONE DETECTED NONE DETECTED   Benzodiazepines NONE DETECTED NONE DETECTED   Amphetamines NONE DETECTED NONE DETECTED   Tetrahydrocannabinol NONE DETECTED NONE DETECTED   Barbiturates NONE DETECTED NONE DETECTED    Comment:        DRUG SCREEN FOR MEDICAL PURPOSES ONLY.  IF CONFIRMATION IS NEEDED FOR ANY PURPOSE, NOTIFY LAB WITHIN 5 DAYS.        LOWEST DETECTABLE LIMITS FOR URINE DRUG SCREEN Drug Class       Cutoff (ng/mL) Amphetamine      1000 Barbiturate      200 Benzodiazepine   222 Tricyclics       979 Opiates          300 Cocaine          300 THC              50   Urinalysis, Routine w reflex microscopic     Status: None   Collection Time: 09/12/16 10:50 AM  Result Value Ref Range   Color, Urine YELLOW YELLOW   APPearance CLEAR CLEAR   Specific Gravity, Urine 1.008 1.005 - 1.030   pH 7.0 5.0 - 8.0   Glucose, UA NEGATIVE NEGATIVE mg/dL   Hgb urine dipstick NEGATIVE NEGATIVE   Bilirubin Urine NEGATIVE NEGATIVE   Ketones, ur NEGATIVE NEGATIVE mg/dL   Protein, ur NEGATIVE NEGATIVE mg/dL   Nitrite NEGATIVE NEGATIVE   Leukocytes, UA NEGATIVE NEGATIVE  CBC     Status: Abnormal   Collection Time: 09/12/16  5:57 PM  Result Value Ref Range   WBC 17.1 (H) 4.0 - 10.5 K/uL   RBC 6.02 (H) 4.22 - 5.81 MIL/uL   Hemoglobin 15.7 13.0 - 17.0 g/dL   HCT 46.1 39.0 - 52.0 %   MCV 76.6 (L) 78.0 - 100.0 fL   MCH 26.1 26.0 - 34.0 pg   MCHC 34.1 30.0 - 36.0 g/dL   RDW 19.3 (H) 11.5 - 15.5 %   Platelets 133 (L) 150 - 400 K/uL    Comment: SPECIMEN CHECKED FOR CLOTS LARGE PLATELETS PRESENT GIANT PLATELETS SEEN    Creatinine, serum     Status: Abnormal   Collection Time: 09/12/16  5:57 PM  Result Value Ref Range   Creatinine, Ser 1.39 (H) 0.61 - 1.24 mg/dL   GFR calc non Af Amer 48 (L) >60 mL/min   GFR calc Af Amer 55 (L) >60 mL/min    Comment: (NOTE) The eGFR has been calculated using the CKD EPI equation. This calculation has not been validated in all clinical situations. eGFR's persistently <60 mL/min signify possible Chronic Kidney Disease.   TSH     Status: Abnormal   Collection Time: 09/12/16  5:57 PM  Result Value Ref Range   TSH 5.455 (H) 0.350 - 4.500 uIU/mL    Comment: Performed by a 3rd Generation assay with a functional sensitivity of <=0.01 uIU/mL.  Troponin I     Status: None   Collection Time: 09/12/16  5:57 PM  Result Value Ref Range   Troponin I <0.03 <0.03 ng/mL  Troponin I     Status: None   Collection Time: 09/12/16 10:55 PM  Result Value Ref Range   Troponin I <0.03 <0.03 ng/mL  Magnesium     Status: None   Collection Time: 09/13/16  4:52 AM  Result Value Ref Range   Magnesium 2.3 1.7 - 2.4 mg/dL  Troponin I  Status: None   Collection Time: 09/13/16  4:52 AM  Result Value Ref Range   Troponin I <0.03 <0.03 ng/mL  CBC     Status: Abnormal   Collection Time: 09/13/16  4:52 AM  Result Value Ref Range   WBC 15.1 (H) 4.0 - 10.5 K/uL   RBC 5.48 4.22 - 5.81 MIL/uL   Hemoglobin 13.8 13.0 - 17.0 g/dL   HCT 42.1 39.0 - 52.0 %   MCV 76.8 (L) 78.0 - 100.0 fL   MCH 25.2 (L) 26.0 - 34.0 pg   MCHC 32.8 30.0 - 36.0 g/dL   RDW 20.8 (H) 11.5 - 15.5 %   Platelets 123 (L) 150 - 400 K/uL    Comment: SPECIMEN CHECKED FOR CLOTS LARGE PLATELETS PRESENT   Basic metabolic panel     Status: Abnormal   Collection Time: 09/13/16  4:52 AM  Result Value Ref Range   Sodium 140 135 - 145 mmol/L   Potassium 3.8 3.5 - 5.1 mmol/L    Comment: DELTA CHECK NOTED   Chloride 99 (L) 101 - 111 mmol/L   CO2 32 22 - 32 mmol/L   Glucose, Bld 152 (H) 65 - 99 mg/dL   BUN 25 (H) 6 - 20 mg/dL    Creatinine, Ser 1.36 (H) 0.61 - 1.24 mg/dL   Calcium 8.9 8.9 - 10.3 mg/dL   GFR calc non Af Amer 49 (L) >60 mL/min   GFR calc Af Amer 57 (L) >60 mL/min    Comment: (NOTE) The eGFR has been calculated using the CKD EPI equation. This calculation has not been validated in all clinical situations. eGFR's persistently <60 mL/min signify possible Chronic Kidney Disease.    Anion gap 9 5 - 15  Glucose, capillary     Status: Abnormal   Collection Time: 09/13/16  6:48 AM  Result Value Ref Range   Glucose-Capillary 156 (H) 65 - 99 mg/dL  Glucose, capillary     Status: Abnormal   Collection Time: 09/13/16  7:57 AM  Result Value Ref Range   Glucose-Capillary 133 (H) 65 - 99 mg/dL   Comment 1 Notify RN    Comment 2 Document in Chart     Studies/Results:     Agapito Hanway A. Merlene Laughter, M.D.  Diplomate, Tax adviser of Psychiatry and Neurology ( Neurology). 09/13/2016, 11:11 AM

## 2016-09-13 NOTE — Progress Notes (Signed)
*  PRELIMINARY RESULTS* Echocardiogram 2D Echocardiogram has been performed.  [name] [name], [name] [name] 09/13/2016, 3:03 PM

## 2016-09-13 NOTE — Clinical Social Work Note (Addendum)
Clinical Social Work Assessment  Patient Details  Name: [name] [name] MRN: [id number] Date of Birth: 1940-05-25  Date of referral:  09/13/16               Reason for consult:  Discharge Planning                Permission sought to share information with:    Permission granted to share information::     Name::        Agency::     Relationship::     Contact Information:     Housing/Transportation Living arrangements for the past 2 months:  Single Family Home Source of Information:  Spouse Patient Interpreter Needed:  None Criminal Activity/Legal Involvement Pertinent to Current Situation/Hospitalization:    Significant Relationships:  Adult Children, Spouse Lives with:  Spouse Do you feel safe going back to the place where you live?  Yes Need for family participation in patient care:  Yes (Comment)  Care giving concerns:  None identified at baseline.   Social Worker assessment / plan:  Patient resides with his caregiver. At baseline he a cane and completes ADLs unassisted. Patient is agreeable to SNF. Patient's clinical information faxed out to selected facilities.  LCSW started [employer] authorization. Employment status:  Retired Nurse, adult PT Recommendations:  [place] / Referral to community resources:  [place]  Patient/Family's Response to care:  Family is agreeable to SNF as is patient.  Patient/Family's Understanding of and Emotional Response to Diagnosis, Current Treatment, and Prognosis:  Patient understands his diagnosis, treatment and prognosis.   Emotional Assessment Appearance:  Appears stated age Attitude/Demeanor/Rapport:   (Cooperative) Affect (typically observed):  Accepting, Calm Orientation:  Oriented to Self, Oriented to Place, Oriented to  Time, Oriented to Situation Alcohol / Substance use:  Not Applicable Psych involvement (Current and /or in the community):  No  (Comment)  Discharge Needs  Concerns to be addressed:  Discharge Planning Concerns Readmission within the last 30 days:  No Current discharge risk:  None Barriers to Discharge:  No Barriers Identified   [name] Gully, LCSW 09/13/2016, 2:25 PM

## 2016-09-13 NOTE — Evaluation (Signed)
Occupational Therapy Evaluation Patient Details Name: [name] [name] MRN: [id number] DOB: [birth year] Today's Date: 09/13/2016    History of Present Illness [name] [name] is a 76 y.o. male with medical history significant of chronic diastolic congestive heart failure on diuretics, presents with multiple episodes of syncope. Patient reports that he woke up this morning and when he tried to get out of bed, he became increasingly lightheaded and dizzy. He reports falling back in bed and passing out for an unknown period of time. After regaining consciousness, patient called for his wife who attempted to get him up which made him increasingly dizzy again. He says he may have had some mild left-sided chest discomfort today, but is unclear on details. Denies any shortness of breath. No fever, cough, nausea, vomiting, diarrhea. By mouth intake has been normal.. He describes paresthesias in his left lower extremities which has been intermittent for the past week   Clinical Impression   Pt on [facility] upon therapy arrival and reports that he is finished and needs assistance getting off. Patient is agreeable to participate in OT evaluation. Patient reports that it required 2 people to transfer him to the [facility] from the bed and he has been very dizzy although with the use of a RW, patient was able to transfer back to bed with Min assist and increased time for cueing and safety. Patient presents with LUE weakness although he reports that his shoulder is weak at baseline from a previous L RCR. Patient does have decreased gross grip strength in the left hand. Increased dizziness makes patient at a hight risk for falling and he requires increased physical assistance to complete Basic ADL tasks. Patient would benefit from short term rehab at a SNF as his friend is unable to provide any physical assistance. Skilled OT services will be provides to focus on UB strength and functional performance during ADL and transfers.      Follow Up Recommendations  SNF    Equipment Recommendations  None recommended by OT       Precautions / Restrictions Precautions Precautions: Fall Precaution Comments: pt reports 3 recent falls at home in which he became dizzy and lost consciousness. Patient reports that each time he either fell back in bed or into a chair.  Restrictions Weight Bearing Restrictions: No      Mobility Bed Mobility Overal bed mobility: Needs Assistance Bed Mobility: Sit to Supine       Sit to supine: Min assist      Transfers Overall transfer level: Needs assistance Equipment used: Rolling walker (2 wheeled) Transfers: Sit to/from [facility] Sit to Stand: Min assist Stand pivot transfers: Min assist                ADL either performed or assessed with clinical judgement   ADL Overall ADL's : Needs assistance/impaired                     Lower Body Dressing: Total assistance;Sit to/from stand Lower Body Dressing Details (indicate cue type and reason): Due to dizziness and height of bed.  Toilet Transfer: Minimal assistance;Cueing for safety;Cueing for sequencing;Ambulation;RW;BSC Toilet Transfer Details (indicate cue type and reason): Pt completed with increased time and small steps rotating to the right.  Toileting- Clothing Manipulation and Hygiene: Total assistance;Sitting/lateral lean;Sit to/from stand               Vision   Ocular Range of Motion: Within Functional Limits Alignment/Gaze Preference: Within Defined Limits Tracking/Visual Pursuits:  Able to track stimulus in all quads without difficulty Saccades: Additional eye shifts occurred during testing Convergence: Within functional limits            Pertinent Vitals/Pain Pain Assessment:  (Pt report low back pain while seated on EOB. No number given.)     Hand Dominance Right   Extremity/Trunk Assessment Upper Extremity Assessment Upper Extremity Assessment: RUE  deficits/detail RUE Deficits / Details: Pt has a hx of Left RCR approx. 8 years ago with baseline weakness. Patient is only able to complete A/ROM shoulder flexion, er, IR slightly above 50% range. Shoulder strength: 4/5 (baseline). Decreased gross grasp. Patient reports numbness/tingling in 2nd, 3rd, and 4th digit finger pads. Coordination is WFL.    Lower Extremity Assessment Lower Extremity Assessment: Defer to PT evaluation       Communication Communication Communication: No difficulties   Cognition Arousal/Alertness: Awake/alert Behavior During Therapy: WFL for tasks assessed/performed Overall Cognitive Status: Within Functional Limits for tasks assessed                                                Home Living Family/patient expects to be discharged to:: Private residence Living Arrangements: Non-relatives/Friends Available Help at Discharge:  (Unknown. friend is unable to provide any physical assistance.)   Home Access: Stairs to enter Entrance Stairs-Number of Steps: 2 Entrance Stairs-Rails: Left Home Layout: One level     Bathroom Shower/Tub: Teacher, early years/pre: Handicapped height     Home Equipment: Environmental consultant - 2 wheels;Walker - 4 wheels;Cane - single point;Bedside commode;Hospital bed  pt reports that he used a single point cane primarily at home for ambulation.        Prior Functioning/Environment Level of Independence: Independent with assistive device(s)        Comments: Pt reports that he sponge bathes as he is unable to get into the tub even with a tub bench/shower chair. Patient does not drive. Male friend drives and completes all meal prep and housekeeping tasks. Patient reports that friend has had 2 CVAs and is unable to provide any physical assistance.         OT Problem List: Impaired balance (sitting and/or standing);Decreased strength      OT Treatment/Interventions: Self-care/ADL training;Therapeutic  exercise;Therapeutic activities;Neuromuscular education;Patient/family education;Balance training;DME and/or AE instruction    OT Goals(Current goals can be found in the care plan section) Acute Rehab OT Goals Patient Stated Goal: to get stronger OT Goal Formulation: With patient Time For Goal Achievement: 09/27/16 Potential to Achieve Goals: Good  OT Frequency: Min 2X/week   Barriers to D/C: Decreased caregiver support             End of Session Equipment Utilized During Treatment: Gait belt;Rolling walker Nurse Communication: Mobility status  Activity Tolerance: Patient tolerated treatment well Patient left: in bed;with call bell/phone within reach  OT Visit Diagnosis: Muscle weakness (generalized) (M62.81)                Time: 0930-1020 OT Time Calculation (min): 50 min Charges:  OT General Charges $OT Visit: 1 Procedure OT Evaluation $OT Eval Low Complexity: 1 Procedure OT Treatments $Self Care/Home Management : 23-37 mins (30') G-Codes: OT G-codes **NOT FOR INPATIENT CLASS** Functional Limitation: Self care Self Care Current Status (D6644): At least 60 percent but less than 80 percent impaired, limited or restricted Self Care Goal  Status [phone]): At least 40 percent but less than 60 percent impaired, limited or restricted   Ailene Ravel, OTR/L,CBIS  [phone]   Hamid Brookens, Clarene Duke 09/13/2016, 11:08 AM

## 2016-09-13 NOTE — Care Management Note (Signed)
Case Management Note  Patient Details  Name: [name] [name] MRN: [id number] Date of Birth: November 08, 1940  Subjective/Objective:                  Pt admitted with CVA. He is from home, lives with caregiver. PT has recommended SNF. Pt agreeable.   Action/Plan: CSW working with pt on placement. No CM needs anticipated.   Expected Discharge Date:     09/14/2016             Expected Discharge Plan:  [place]  In-House Referral:  Clinical Social Work  Discharge planning Services  CM Consult  Post Acute Care Choice:  NA Choice offered to:  NA  Status of Service:  Completed, signed off  Sherald Barge, RN 09/13/2016, 3:27 PM

## 2016-09-13 NOTE — Progress Notes (Signed)
PROGRESS NOTE    [name]  ZJQ:[id number] DOB: [birth year] DOA: 09/12/2016 PCP: [name], MD    Brief Narrative:  76 year old male with a history of chronic diastolic heart failure, presented with several episodes of syncope. Found to be orthostatic and dehydrated. Further workup revealed acute CVA. He is undergoing stroke workup per order set. Neurology following. Plan is for skilled nursing facility once workup is complete.   Assessment & Plan:   Active Problems:   GASTROESOPHAGEAL REFLUX DISEASE, CHRONIC   Hypothyroidism   Hypokalemia   Chronic diastolic CHF (congestive heart failure) (HCC)   Syncope   Acute CVA (cerebrovascular accident) ([place])   1. Syncope. Likely related to orthostasis and hypotension. Patient was taking large diuretics prior to admission. We'll continue to hold diuretics for now. He has been started on gentle hydration and is starting to feel better. Cardiac enzymes have been negative. Echocardiogram is unremarkable.  2. Acute CVA. MRI positive for acute infarct. Neurology following. Started on aspirin. This would likely explain his lower extremity symptoms. Further workup in process including carotid Dopplers. Seen by physical therapy and recommended skilled nursing facility placement.  3. Hyperglycemia. Started on sliding scale insulin. Hemoglobin A1c pending.  4. Chronic diastolic heart failure. Appears compensated. Continue to hold diuretics for now. Patient does have chronic lower extremity edema appears to be near baseline.  5. Hypokalemia. Related to diuretics. Improved.  6. Hypothyroidism. Continue Synthroid. TSH normal.  7. Hypertension. Stable.   DVT prophylaxis: Lovenox Code Status: DNR Family Communication: no family present Disposition Plan: Skilled nursing facility placement upon discharge.   Consultants:   Neurology  Procedures:  Echo:- Mild LVH with LVEF 60-65% and grossly normal diastolic function.   Trivial mitral  regurgitation. Mildly sclerotic aortic valve.   Trivial tricuspid regurgitation with PASP estimated 31 mmHg,    normal range  Antimicrobials:      Subjective: Dizziness is improving. Still has paresthesias in left lower extremity.  Objective: Vitals:   09/13/16 1000 09/13/16 1200 09/13/16 1252 09/13/16 1400  BP: (!) 100/50 (!) 80/40 (!) 80/51 (!) 115/55  Pulse: 90 89 69 73  Resp: 16 18  20   Temp: 98.5 F (36.9 C) 98.9 F (37.2 C)  98.4 F (36.9 C)  TempSrc: Oral Oral  Oral  SpO2: 97% 98% 96% 96%  Weight:      Height:        Intake/Output Summary (Last 24 hours) at 09/13/16 1638 Last data filed at 09/13/16 0655  Gross per 24 hour  Intake          1191.67 ml  Output              350 ml  Net           841.67 ml   Filed Weights   09/12/16 1028 09/12/16 1328  Weight: 124.7 kg (275 lb) 119.1 kg (262 lb 9.1 oz)    Examination:  General exam: Appears calm and comfortable  Respiratory system: Clear to auscultation. Respiratory effort normal. Cardiovascular system: S1 & S2 heard, RRR. No JVD, murmurs, rubs, gallops or clicks.1+ pedal edema. Gastrointestinal system: Abdomen is nondistended, soft and nontender. No organomegaly or masses felt. Normal bowel sounds heard. Central nervous system: Alert and oriented. No focal neurological deficits. Extremities: Symmetric 5 x 5 power. Skin: No rashes, lesions or ulcers Psychiatry: Judgement and insight appear normal. Mood & affect appropriate.     Data Reviewed: I have personally reviewed following labs and imaging studies  CBC:  Recent Labs Lab 09/12/16 1029 09/12/16 1040 09/12/16 1757 09/13/16 0452  WBC 15.6*  --  17.1* 15.1*  NEUTROABS 13.3*  --   --   --   HGB 16.6 17.3* 15.7 13.8  HCT 48.4 51.0 46.1 42.1  MCV 76.1*  --  76.6* 76.8*  PLT 141*  --  133* 419*   Basic Metabolic Panel:  Recent Labs Lab 09/12/16 1029 09/12/16 1040 09/12/16 1757 09/13/16 0452  NA 135 136  --  140  K 2.4* 2.4*  --  3.8  CL  86* 87*  --  99*  CO2 37*  --   --  32  GLUCOSE 153* 151*  --  152*  BUN 32* 32*  --  25*  CREATININE 1.41* 1.60* 1.39* 1.36*  CALCIUM 10.0  --   --  8.9  MG  --   --   --  2.3   GFR: Estimated Creatinine Clearance: 56.1 mL/min (A) (by C-G formula based on SCr of 1.36 mg/dL (H)). Liver Function Tests:  Recent Labs Lab 09/12/16 1029  AST 21  ALT 18  ALKPHOS 67  BILITOT 1.3*  PROT 7.0  ALBUMIN 3.7   No results for input(s): LIPASE, AMYLASE in the last 168 hours. No results for input(s): AMMONIA in the last 168 hours. Coagulation Profile:  Recent Labs Lab 09/12/16 1029  INR 0.94   Cardiac Enzymes:  Recent Labs Lab 09/12/16 1757 09/12/16 2255 09/13/16 0452  TROPONINI <0.03 <0.03 <0.03   BNP (last 3 results) No results for input(s): PROBNP in the last 8760 hours. HbA1C: No results for input(s): HGBA1C in the last 72 hours. CBG:  Recent Labs Lab 09/12/16 1023 09/13/16 0648 09/13/16 0757  GLUCAP 162* 156* 133*   Lipid Profile: No results for input(s): CHOL, HDL, LDLCALC, TRIG, CHOLHDL, LDLDIRECT in the last 72 hours. Thyroid Function Tests:  Recent Labs  09/12/16 1757  TSH 5.455*   Anemia Panel:  Recent Labs  09/13/16 1045  VITAMINB12 589   Sepsis Labs: No results for input(s): PROCALCITON, LATICACIDVEN in the last 168 hours.  No results found for this or any previous visit (from the past 240 hour(s)).       Radiology Studies: Dg Chest 2 View  Result Date: 09/12/2016 CLINICAL DATA:  Dizziness, weakness.  Syncope. EXAM: CHEST  2 VIEW COMPARISON:  03/22/2016 FINDINGS: Low lung volumes. No confluent opacities or effusions. Heart is normal size. No acute bony abnormality. IMPRESSION: Low lung volumes.  No acute findings. Electronically Signed   By: Rolm Baptise M.D.   On: 09/12/2016 11:51   Ct Head Wo Contrast  Result Date: 09/12/2016 CLINICAL DATA:  Syncope, dizziness EXAM: CT HEAD WITHOUT CONTRAST TECHNIQUE: Contiguous axial images were  obtained from the base of the skull through the vertex without intravenous contrast. COMPARISON:  03/22/2016 FINDINGS: Brain: No evidence of acute infarction, hemorrhage, hydrocephalus, extra-axial collection or mass lesion/mass effect. Mild age related atrophy. Vascular: No hyperdense vessel or unexpected calcification. Skull: Normal. Negative for fracture or focal lesion. Sinuses/Orbits: The visualized paranasal sinuses are essentially clear. The mastoid air cells are unopacified. Other: None. IMPRESSION: Normal head CT. Electronically Signed   By: Julian Hy M.D.   On: 09/12/2016 12:11   Mri Brain Without Contrast  Result Date: 09/12/2016 CLINICAL DATA:  76 y/o M; left-sided headache, left-sided chest pain, and numbness in third through fifth toes on the left foot. EXAM: MRI HEAD WITHOUT CONTRAST TECHNIQUE: Multiplanar, multiecho pulse sequences of the brain and surrounding structures were  obtained without intravenous contrast. COMPARISON:  09/12/2016 CT of the head. FINDINGS: Brain: Punctate focus of low diffusion within the right posterior limb of internal capsule (series 5, image 50) probably represents a tiny acute infarction. Few foci of T2 FLAIR hyperintense signal abnormality in periventricular white matter are compatible with minimal chronic microvascular ischemic changes. No significant brain parenchymal volume loss. No abnormal susceptibility hypointensity to indicate intracranial hemorrhage. No hydrocephalus or extra-axial collection. No effacement of basilar cisterns. No focal mass effect. Vascular: Normal flow voids. Skull and upper cervical spine: Normal marrow signal. Sinuses/Orbits: Partial opacification of right posterior ethmoid air cells. No abnormal signal of the mastoid air cells. Bilateral intra-ocular lens replacement. Other: None. IMPRESSION: 1. Punctate focus of low diffusion within right posterior limb of internal capsule on coronal diffusion weighted sequence probably  represents a tiny acute infarction. No associated mass effect or hemorrhage. 2. Minimal chronic microvascular ischemic changes of the brain. 3. Mild ethmoid sinus disease. These results will be called to the ordering clinician or representative by the Radiologist Assistant, and communication documented in the PACS or zVision Dashboard. Electronically Signed   By: Kristine Garbe M.D.   On: 09/12/2016 18:53        Scheduled Meds: . amLODipine  10 mg Oral Daily  . aspirin  325 mg Oral Daily  . bisacodyl  5 mg Oral Daily  . brimonidine  1 drop Right Eye BID  . dorzolamide-timolol  1 drop Both Eyes BID  . enoxaparin (LOVENOX) injection  40 mg Subcutaneous Q24H  . ferrous sulfate  325 mg Oral Q breakfast  . insulin aspart  0-15 Units Subcutaneous TID WC  . insulin aspart  0-5 Units Subcutaneous QHS  . latanoprost  1 drop Both Eyes QHS  . metoprolol tartrate  25 mg Oral Daily  . pantoprazole  40 mg Oral BID  . sodium chloride flush  3 mL Intravenous Q12H  . tiZANidine  4 mg Oral BID   Continuous Infusions:   LOS: 0 days    Time spent: 3mins    Kyleigh Nannini, MD [facility] Pager [phone]  If 7PM-7AM, please contact night-coverage [email or web address] Password [facility] 09/13/2016, 4:38 PM

## 2016-09-13 NOTE — Clinical Social Work Placement (Signed)
   CLINICAL SOCIAL WORK PLACEMENT  NOTE  Date:  09/13/2016  Patient Details  Name: [name] [name] MRN: [id number] Date of Birth: 1941-02-04  Clinical Social Work is seeking post-discharge placement for this patient at the [place] level of care (*CSW will initial, date and re-position this form in  chart as items are completed):  Yes   Patient/family provided with [place] Work Department's list of facilities offering this level of care within the geographic area requested by the patient (or if unable, by the patient's family).  Yes   Patient/family informed of their freedom to choose among providers that offer the needed level of care, that participate in Medicare, Medicaid or managed care program needed by the patient, have an available bed and are willing to accept the patient.  Yes   Patient/family informed of [facility]'s ownership interest in [facility] and [facility], as well as of the fact that they are under no obligation to receive care at these facilities.  PASRR submitted to EDS on       PASRR number received on       Existing PASRR number confirmed on 09/13/16     FL2 transmitted to all facilities in geographic area requested by pt/family on 09/13/16     FL2 transmitted to all facilities within larger geographic area on       Patient informed that his/her managed care company has contracts with or will negotiate with certain facilities, including the following:            Patient/family informed of bed offers received.  Patient chooses bed at       Physician recommends and patient chooses bed at      Patient to be transferred to   on  .  Patient to be transferred to facility by       Patient family notified on   of transfer.  Name of family member notified:        PHYSICIAN       Additional Comment:    _______________________________________________ Ihor Gully, LCSW 09/13/2016, 2:23 PM

## 2016-09-13 NOTE — NC FL2 (Signed)
[place] LEVEL OF CARE SCREENING TOOL     IDENTIFICATION  Patient Name: [name] [name] Birthdate: 27-Jun-1940 Sex: male Admission Date (Current Location): 09/12/2016  [facility] and [place] Number:  [employer] and Address:  [facility] [address], [place]      Provider Number: [id number]  Attending Physician Name and Address:  Kathie Dike, MD  Relative Name and Phone Number:       Current Level of Care: Hospital Recommended Level of Care: [place] Prior Approval Number:    Date Approved/Denied:   PASRR Number: [id number]  Discharge Plan: SNF    Current Diagnoses: Patient Active Problem List   Diagnosis Date Noted  . Syncope 09/12/2016  . Iron deficiency 07/07/2016  . Acute on chronic diastolic congestive heart failure ([place]) 03/23/2016  . Back pain 03/22/2016  . Acute right-sided low back pain with right-sided sciatica   . S/P nasal septoplasty 10/07/2015  . Parotitis 03/06/2015  . Chronic diastolic CHF (congestive heart failure) ([place]) 03/06/2015  . Parotitis, acute 03/06/2015  . Injection site irritation 03/06/2015  . Nausea without vomiting 12/08/2014  . Dysphagia, pharyngoesophageal phase   . History of colonic polyps   . Hypokalemia 09/01/2013  . Dehydration 09/01/2013  . Chest pain 09/01/2013  . Leukocytosis 09/01/2013  . Right-sided heart failure ([place]) 08/20/2013  . Acute on chronic diastolic heart failure ([place]) 08/20/2013  . Acute on chronic diastolic CHF 76/16/0737  . Diastolic dysfunction by echo Dec 2014 07/30/2013  . Morbid obesity- BMI 44.5 07/30/2013  . Edema of both legs 07/30/2013  . Bilateral lower leg cellulitis 07/18/2013  . Pre-operative cardiovascular examination 06/12/2013  . 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 Right heart failure ([place]) 11/27/2012  . OSA (obstructive sleep apnea) 11/27/2012  . Other spondylosis with radiculopathy, lumbar region 08/22/2012   . Bright red blood per rectum 06/15/2012  . Constipation 02/29/2012  . Edema 02/29/2012  . SIRS (systemic inflammatory response syndrome) ([place]) 02/27/2012  . UTI (lower urinary tract infection) 02/27/2012  . BPH (benign prostatic hyperplasia) 02/27/2012  . Obesity 02/27/2012  . Hyperlipidemia 02/27/2012  . Thrombocytopenia ([place]) 12/30/2011  . Hypothyroidism 10/27/2011  . Upper abdominal pain 10/27/2011  . Anorexia 10/27/2011  . Bowel habit changes 10/27/2011  . Constipation 04/20/2011  . Rectal bleeding 11/16/2010  . NAUSEA 06/08/2010  . DIARRHEA 06/08/2010  . COLONIC POLYPS, ADENOMATOUS, HX OF 07/16/2009  . Essential hypertension 06/19/2009  . GASTROESOPHAGEAL REFLUX DISEASE, CHRONIC 06/19/2009  . FATTY LIVER DISEASE 06/19/2009  . CHOLELITHIASIS, SYMPTOMATIC 06/19/2009  . RENAL INSUFFICIENCY 06/19/2009  . ABDOMINAL BLOATING 06/19/2009    Orientation RESPIRATION BLADDER Height & Weight     Self, Situation, Time, Place  Normal Continent Weight: 262 lb 9.1 oz (119.1 kg) Height:  5\' 6"  (167.6 cm)  BEHAVIORAL SYMPTOMS/MOOD NEUROLOGICAL BOWEL NUTRITION STATUS      Continent Diet (Heart Healthy)  AMBULATORY STATUS COMMUNICATION OF NEEDS Skin   Limited Assist Verbally Normal                       Personal Care Assistance Level of Assistance  Bathing, Feeding, Dressing Bathing Assistance: Limited assistance Feeding assistance: Independent Dressing Assistance: Limited assistance     Functional Limitations Info  Sight, Hearing, Speech Sight Info: Adequate Hearing Info: Adequate Speech Info: Adequate    SPECIAL CARE FACTORS FREQUENCY  PT (By licensed PT), OT (By licensed OT)     PT Frequency: 5x/week  OT Frequency: 3x/week            Contractures Contractures Info: Not present    Additional Factors Info  Code Status, Allergies Code Status Info: DNR Allergies Info: Aspirin, Nexium,            Current Medications (09/13/2016):  This is the current  hospital active medication list Current Facility-Administered Medications  Medication Dose Route Frequency Provider Last Rate Last Dose  . 0.9 % NaCl with KCl 40 mEq / L  infusion   Intravenous Continuous Kathie Dike, MD 100 mL/hr at 09/13/16 0324 100 mL/hr at 09/13/16 0324  . acetaminophen (TYLENOL) tablet 650 mg  650 mg Oral Q6H PRN Kathie Dike, MD       Or  . acetaminophen (TYLENOL) suppository 650 mg  650 mg Rectal Q6H PRN Kathie Dike, MD      . amLODipine (NORVASC) tablet 10 mg  10 mg Oral Daily Kathie Dike, MD   10 mg at 09/13/16 1039  . aspirin tablet 325 mg  325 mg Oral Daily Kathie Dike, MD      . bisacodyl (DULCOLAX) EC tablet 5 mg  5 mg Oral Daily Kathie Dike, MD   5 mg at 09/13/16 1039  . brimonidine (ALPHAGAN) 0.2 % ophthalmic solution 1 drop  1 drop Right Eye BID Kathie Dike, MD   1 drop at 09/13/16 1039  . dorzolamide-timolol (COSOPT) 22.3-6.8 MG/ML ophthalmic solution 1 drop  1 drop Both Eyes BID Kathie Dike, MD   1 drop at 09/13/16 1039  . enoxaparin (LOVENOX) injection 40 mg  40 mg Subcutaneous Q24H Kathie Dike, MD   40 mg at 09/12/16 1725  . ferrous sulfate tablet 325 mg  325 mg Oral Q breakfast Kathie Dike, MD   325 mg at 09/13/16 1039  . insulin aspart (novoLOG) injection 0-15 Units  0-15 Units Subcutaneous TID WC Kathie Dike, MD      . insulin aspart (novoLOG) injection 0-5 Units  0-5 Units Subcutaneous QHS Kathie Dike, MD      . latanoprost (XALATAN) 0.005 % ophthalmic solution 1 drop  1 drop Both Eyes QHS Kathie Dike, MD   1 drop at 09/12/16 2119  . metoprolol tartrate (LOPRESSOR) tablet 25 mg  25 mg Oral Daily Kathie Dike, MD   25 mg at 09/13/16 1040  . ondansetron (ZOFRAN) tablet 4 mg  4 mg Oral Q6H PRN Kathie Dike, MD       Or  . ondansetron (ZOFRAN) injection 4 mg  4 mg Intravenous Q6H PRN Kathie Dike, MD      . oxyCODONE (Oxy IR/ROXICODONE) immediate release tablet 10 mg  10 mg Oral BID PRN Kathie Dike, MD   10 mg at  09/12/16 1726  . pantoprazole (PROTONIX) EC tablet 40 mg  40 mg Oral BID Kathie Dike, MD   40 mg at 09/13/16 1039  . polyvinyl alcohol (LIQUIFILM TEARS) 1.4 % ophthalmic solution 1 drop  1 drop Both Eyes Daily PRN Kathie Dike, MD      . sodium chloride flush (NS) 0.9 % injection 3 mL  3 mL Intravenous Q12H Kathie Dike, MD   3 mL at 09/12/16 2122  . tiZANidine (ZANAFLEX) tablet 4 mg  4 mg Oral BID Kathie Dike, MD   4 mg at 09/13/16 1039     Discharge Medications: Please see discharge summary for a list of discharge medications.  Relevant Imaging Results:  Relevant Lab Results:   Additional Information SSN [id number] Nira Conn  D, LCSW

## 2016-09-13 NOTE — Evaluation (Signed)
Physical Therapy Evaluation Patient Details Name: [name] [name] MRN: [id number] DOB: 26-Aug-1940 Today's Date: 09/13/2016   History of Present Illness  Karo Rog is a 76 y.o. male with medical history significant of chronic diastolic congestive heart failure on diuretics, presents with multiple episodes of syncope. Patient reports that he woke up this morning and when he tried to get out of bed, he became increasingly lightheaded and dizzy. He reports falling back in bed and passing out for an unknown period of time. After regaining consciousness, patient called for his wife who attempted to get him up which made him increasingly dizzy again. He says he may have had some mild left-sided chest discomfort today, but is unclear on details. Denies any shortness of breath. No fever, cough, nausea, vomiting, diarrhea. By mouth intake has been normal.. He describes paresthesias in his left lower extremities which has been intermittent for the past week  MRI shows: Punctate focus of low diffusion within right posterior limb of internal capsule on coronal diffusion weighted sequence probably represents a tiny acute infarction. No associated mass effect or hemorrhage.    Clinical Impression  Pt received in bed, caregiver present, and pt is agreeable to PT evaluation.  Pt states that he is normally ambulatory using a cane for limited community distances.  He states that he is independent with dressing and bathing.  During PT evaluation, he expressed dizziness with sitting up on the EOB, and also with standing.   Orthostatic vitals were as follows:   Supine: 80/40, HR: 89bpm Sitting: 100/51, HR: 74bpm with c/o spinning sensation Standing: 106/50, HR: 73bpm with c/o spinning sensation  Pt was able to transfer bed<>chair with min/mod A and RW.  During this transfer, he demonstrates what seems to be clonus in L LE with a rhythmic shaking.  Pt unable to attempt ambulation at this time due to poor balance with  transfer.  Recommend SNF at this time     Follow Up Recommendations SNF    Equipment Recommendations  None recommended by PT    Recommendations for Other Services       Precautions / Restrictions Precautions Precautions: Fall Precaution Comments: pt reports 3 recent falls at home in which he became dizzy and lost consciousness. Patient reports that each time he either fell back in bed or into a chair.  Restrictions Weight Bearing Restrictions: No      Mobility  Bed Mobility Overal bed mobility: Needs Assistance Bed Mobility: Supine to Sit     Supine to sit: Min guard;HOB elevated Sit to supine: Min assist   General bed mobility comments: increased time, and pt needs cues for breathing due to valsalva.  Pt c/o spinning sensation upon sitting on the EOB, but not as bad as it has been.   Transfers Overall transfer level: Needs assistance Equipment used: Rolling walker (2 wheeled) Transfers: Sit to/from [facility] Sit to Stand: Mod assist Stand pivot transfers: Min assist;Mod assist       General transfer comment: Pt demonstrates clonus in L LE while taking steps to transfer bed<>chair.  Pt requires 1 step commands for step sequencing for transfer. Pt also c/o spinning sensation when standing.    Ambulation/Gait Ambulation/Gait assistance:  (NA due to L LE weakness.  )              Stairs            Wheelchair Mobility    Modified Rankin (Stroke Patients Only) Modified Rankin (Stroke Patients Only) Pre-Morbid  Rankin Score: Slight disability Modified Rankin: Moderately severe disability     Balance Overall balance assessment: History of Falls;Needs assistance Sitting-balance support: Bilateral upper extremity supported;Feet supported Sitting balance-Leahy Scale: Good     Standing balance support: Bilateral upper extremity supported Standing balance-Leahy Scale: Fair                               Pertinent  Vitals/Pain Pain Assessment: 0-10 Pain Score: 6  Pain Location: L LE Pain Descriptors / Indicators: Aching;Stabbing Pain Intervention(s): Limited activity within patient's tolerance;Monitored during session;Repositioned    Home Living Family/patient expects to be discharged to:: Private residence Living Arrangements: Non-relatives/Friends (caretaker only at the house until May 30th) Available Help at Discharge: Available 24 hours/day Type of Home: House Home Access: Stairs to enter Entrance Stairs-Rails: Left Entrance Stairs-Number of Steps: 2 Home Layout: One level Home Equipment: Walker - 2 wheels;Walker - 4 wheels;Cane - single point;Bedside commode;Hospital bed;Other (comment) (pt sleeps in both the hospital bed and in the recliner. )      Prior Function Level of Independence: Independent with assistive device(s)   Gait / Transfers Assistance Needed: Pt states he uses the cane for ambulation 99% of the time.  Limited community ambulation.    ADL's / Homemaking Assistance Needed: independent with dressing, and sponge baths.    Comments: Pt reports that he sponge bathes as he is unable to get into the tub even with a tub bench/shower chair. Patient does not drive. Male friend drives and completes all meal prep and housekeeping tasks. Patient reports that friend has had 2 CVAs and is unable to provide any physical assistance.      Hand Dominance   Dominant Hand: Right    Extremity/Trunk Assessment   Upper Extremity Assessment Upper Extremity Assessment: Defer to OT evaluation RUE Deficits / Details: h/o L RCR     Lower Extremity Assessment Lower Extremity Assessment: LLE deficits/detail;RLE deficits/detail RLE Deficits / Details: Grossly 3/5 LLE Deficits / Details: grossly 3-/5 with some clonus noted as he is taking steps for transfer bed<>chair.         Communication   Communication: No difficulties  Cognition Arousal/Alertness: Awake/alert Behavior During Therapy:  WFL for tasks assessed/performed Overall Cognitive Status: Within Functional Limits for tasks assessed                                        General Comments      Exercises     Assessment/Plan    PT Assessment Patient needs continued PT services  PT Problem List Decreased strength;Decreased activity tolerance;Decreased balance;Decreased mobility;Decreased knowledge of use of DME;Decreased safety awareness;Obesity       PT Treatment Interventions DME instruction;Gait training;Functional mobility training;Therapeutic activities;Therapeutic exercise;Balance training;Cognitive remediation;Neuromuscular re-education;Patient/family education    PT Goals (Current goals can be found in the Care Plan section)  Acute Rehab PT Goals Patient Stated Goal: To get stronger PT Goal Formulation: With patient/family Time For Goal Achievement: 09/27/16 Potential to Achieve Goals: Fair    Frequency 7X/week   Barriers to discharge Decreased caregiver support Pt has a caregiver that lives with him, however, she will be leaving on May 30th.     Co-evaluation               End of Session Equipment Utilized During Treatment: Gait belt Activity Tolerance: Patient limited  by fatigue Patient left: in chair;with call bell/phone within reach;with chair alarm set;with family/visitor present Nurse Communication: Mobility status Jenny Reichmann, RN notified, and mobility sheet left in the room.  ) PT Visit Diagnosis: History of falling (Z91.81);Muscle weakness (generalized) (M62.81);Other abnormalities of gait and mobility (R26.89)    Time: 3888-2800 PT Time Calculation (min) (ACUTE ONLY): 43 min   Charges:   PT Evaluation $PT Eval Low Complexity: 1 Procedure PT Treatments $Therapeutic Activity: 23-37 mins   PT G Codes:   PT G-Codes **NOT FOR INPATIENT CLASS** Functional Assessment Tool Used: AM-PAC 6 Clicks Basic Mobility;Clinical judgement Functional Limitation: Mobility: Walking  and moving around Mobility: Walking and Moving Around Current Status (L4917): At least 60 percent but less than 80 percent impaired, limited or restricted Mobility: Walking and Moving Around Goal Status [phone]): At least 40 percent but less than 60 percent impaired, limited or restricted    Beth Rohin Krejci, PT, DPT X: [phone]

## 2016-09-14 ENCOUNTER — Inpatient Hospital Stay
Admission: RE | Admit: 2016-09-14 | Discharge: 2016-10-03 | Disposition: A | Discharge status: Home / Self Care | Payer: PPO | Source: Ambulatory Visit | Attending: Internal Medicine | Admitting: Internal Medicine

## 2016-09-14 DIAGNOSIS — R51 Headache: Secondary | ICD-10-CM | POA: Diagnosis not present

## 2016-09-14 DIAGNOSIS — R42 Dizziness and giddiness: Secondary | ICD-10-CM | POA: Diagnosis not present

## 2016-09-14 DIAGNOSIS — R55 Syncope and collapse: Secondary | ICD-10-CM

## 2016-09-14 DIAGNOSIS — R202 Paresthesia of skin: Secondary | ICD-10-CM | POA: Diagnosis not present

## 2016-09-14 DIAGNOSIS — E785 Hyperlipidemia, unspecified: Secondary | ICD-10-CM | POA: Diagnosis not present

## 2016-09-14 DIAGNOSIS — M7731 Calcaneal spur, right foot: Secondary | ICD-10-CM | POA: Diagnosis not present

## 2016-09-14 DIAGNOSIS — E876 Hypokalemia: Secondary | ICD-10-CM

## 2016-09-14 DIAGNOSIS — M1990 Unspecified osteoarthritis, unspecified site: Secondary | ICD-10-CM | POA: Diagnosis not present

## 2016-09-14 DIAGNOSIS — K5900 Constipation, unspecified: Secondary | ICD-10-CM | POA: Diagnosis not present

## 2016-09-14 DIAGNOSIS — I10 Essential (primary) hypertension: Secondary | ICD-10-CM | POA: Diagnosis not present

## 2016-09-14 DIAGNOSIS — I639 Cerebral infarction, unspecified: Principal | ICD-10-CM

## 2016-09-14 DIAGNOSIS — N400 Enlarged prostate without lower urinary tract symptoms: Secondary | ICD-10-CM | POA: Diagnosis not present

## 2016-09-14 DIAGNOSIS — M359 Systemic involvement of connective tissue, unspecified: Secondary | ICD-10-CM | POA: Diagnosis not present

## 2016-09-14 DIAGNOSIS — E039 Hypothyroidism, unspecified: Secondary | ICD-10-CM

## 2016-09-14 DIAGNOSIS — G4733 Obstructive sleep apnea (adult) (pediatric): Secondary | ICD-10-CM | POA: Diagnosis not present

## 2016-09-14 DIAGNOSIS — I5032 Chronic diastolic (congestive) heart failure: Secondary | ICD-10-CM

## 2016-09-14 DIAGNOSIS — M6281 Muscle weakness (generalized): Secondary | ICD-10-CM | POA: Diagnosis not present

## 2016-09-14 DIAGNOSIS — K219 Gastro-esophageal reflux disease without esophagitis: Secondary | ICD-10-CM

## 2016-09-14 DIAGNOSIS — R279 Unspecified lack of coordination: Secondary | ICD-10-CM | POA: Diagnosis not present

## 2016-09-14 DIAGNOSIS — H9313 Tinnitus, bilateral: Secondary | ICD-10-CM | POA: Diagnosis not present

## 2016-09-14 DIAGNOSIS — K277 Chronic peptic ulcer, site unspecified, without hemorrhage or perforation: Secondary | ICD-10-CM | POA: Diagnosis not present

## 2016-09-14 DIAGNOSIS — D508 Other iron deficiency anemias: Secondary | ICD-10-CM | POA: Diagnosis not present

## 2016-09-14 LAB — T4, FREE: FREE T4: 1.06 ng/dL (ref 0.61–1.12)

## 2016-09-14 LAB — FANA STAINING PATTERNS

## 2016-09-14 LAB — BASIC METABOLIC PANEL
Anion gap: 5 (ref 5–15)
BUN: 19 mg/dL (ref 6–20)
CALCIUM: 8.9 mg/dL (ref 8.9–10.3)
CHLORIDE: 101 mmol/L (ref 101–111)
CO2: 34 mmol/L — AB (ref 22–32)
CREATININE: 1.11 mg/dL (ref 0.61–1.24)
GFR calc Af Amer: >60 mL/min (ref >60)
GFR calc non Af Amer: >60 mL/min (ref >60)
GLUCOSE: 136 mg/dL — AB (ref 65–99)
Potassium: 3.6 mmol/L (ref 3.5–5.1)
Sodium: 140 mmol/L (ref 135–145)

## 2016-09-14 LAB — HOMOCYSTEINE: Homocysteine: 16.3 umol/L — ABNORMAL HIGH (ref 0.0–15.0)

## 2016-09-14 LAB — LIPID PANEL
CHOL/HDL RATIO: 5.1 ratio
CHOLESTEROL: 179 mg/dL (ref 0–200)
HDL: 35 mg/dL — ABNORMAL LOW (ref >40)
LDL Cholesterol: 111 mg/dL — ABNORMAL HIGH (ref 0–99)
TRIGLYCERIDES: 164 mg/dL — AB (ref <150)
VLDL: 33 mg/dL (ref 0–40)

## 2016-09-14 LAB — GLUCOSE, CAPILLARY
GLUCOSE-CAPILLARY: 144 mg/dL — AB (ref 65–99)
Glucose-Capillary: 132 mg/dL — ABNORMAL HIGH (ref 65–99)

## 2016-09-14 LAB — CBC
HCT: 42.5 % (ref 39.0–52.0)
HEMOGLOBIN: 14 g/dL (ref 13.0–17.0)
MCH: 25.7 pg — AB (ref 26.0–34.0)
MCHC: 32.9 g/dL (ref 30.0–36.0)
MCV: 78 fL (ref 78.0–100.0)
PLATELETS: 102 10*3/uL — AB (ref 150–400)
RBC: 5.45 MIL/uL (ref 4.22–5.81)
RDW: 19.6 % — ABNORMAL HIGH (ref 11.5–15.5)
WBC: 12.1 10*3/uL — ABNORMAL HIGH (ref 4.0–10.5)

## 2016-09-14 LAB — ANTINUCLEAR ANTIBODIES, IFA: ANA Ab, IFA: POSITIVE — AB

## 2016-09-14 LAB — RPR: RPR Ser Ql: NONREACTIVE

## 2016-09-14 MED ORDER — TORSEMIDE 20 MG PO TABS: ORAL_TABLET | ORAL | Status: DC

## 2016-09-14 MED ORDER — SIMETHICONE 80 MG PO CHEW
80.0000 mg | CHEWABLE_TABLET | Freq: Four times a day (QID) | ORAL | Status: DC | PRN
  Administered 2016-09-14: 80 mg via ORAL
  Filled 2016-09-14: qty 1

## 2016-09-14 MED ORDER — SIMETHICONE 80 MG PO CHEW: 80.0000 mg | CHEWABLE_TABLET | Freq: Four times a day (QID) | ORAL | 0 refills | Status: DC | PRN

## 2016-09-14 MED ORDER — ATORVASTATIN CALCIUM 40 MG PO TABS: 40.0000 mg | ORAL_TABLET | Freq: Every day | ORAL | 0 refills | Status: DC

## 2016-09-14 MED ORDER — ASPIRIN 325 MG PO TABS: 325.0000 mg | ORAL_TABLET | Freq: Every day | ORAL | 0 refills | Status: DC

## 2016-09-14 MED ORDER — ATORVASTATIN CALCIUM 40 MG PO TABS
40.0000 mg | ORAL_TABLET | Freq: Every day | ORAL | Status: DC
Start: 2016-09-14 — End: 2016-09-14

## 2016-09-14 NOTE — Care Management Important Message (Signed)
Important Message  Patient Details  Name: [name] [name] MRN: [id number] Date of Birth: Jan 17, 1941   Medicare Important Message Given:  Yes    Sherald Barge, RN 09/14/2016, 11:31 AM

## 2016-09-14 NOTE — Clinical Social Work Placement (Signed)
   CLINICAL SOCIAL WORK PLACEMENT  NOTE  Date:  09/14/2016  Patient Details  Name: [name] [name] MRN: [id number] Date of Birth: [birth year]  Clinical Social Work is seeking post-discharge placement for this patient at the [place] level of care (*CSW will initial, date and re-position this form in  chart as items are completed):  Yes   Patient/family provided with [place] Work Department's list of facilities offering this level of care within the geographic area requested by the patient (or if unable, by the patient's family).  Yes   Patient/family informed of their freedom to choose among providers that offer the needed level of care, that participate in Medicare, Medicaid or managed care program needed by the patient, have an available bed and are willing to accept the patient.  Yes   Patient/family informed of [place]'s ownership interest in [facility] and [facility], as well as of the fact that they are under no obligation to receive care at these facilities.  PASRR submitted to EDS on       PASRR number received on       Existing PASRR number confirmed on 09/13/16     FL2 transmitted to all facilities in geographic area requested by pt/family on 09/13/16     FL2 transmitted to all facilities within larger geographic area on       Patient informed that his/her managed care company has contracts with or will negotiate with certain facilities, including the following:        Yes   Patient/family informed of bed offers received.  Patient chooses bed at [facility]     Physician recommends and patient chooses bed at      Patient to be transferred to [facility] on 09/14/16.  Patient to be transferred to facility by [facility] staff     Patient family notified on 09/14/16 of transfer.  Name of family member notified:  Son, who was at bedside.      PHYSICIAN       Additional Comment:  LCSW notified [facility] that patient was discharging today as requested.  LCSW left a voicemail message for Marianna Fuss at [facility] advising of patient discharge.   LCSW signing off.     _______________________________________________ Ihor Gully, LCSW 09/14/2016, 12:54 PM

## 2016-09-14 NOTE — Care Management Note (Signed)
Case Management Note  Patient Details  Name: [name] [name] MRN: [id number] Date of Birth: 08-04-1940  Expected Discharge Date:    09/14/2016              Expected Discharge Plan:  [place]  In-House Referral:  Clinical Social Work  Discharge planning Services  CM Consult  Post Acute Care Choice:  NA Choice offered to:  NA  Status of Service:  Completed, signed off   Additional Comments: Discharging to SNF today. CSW has made placement arrangements. No CM needs.   Sherald Barge, RN 09/14/2016, 11:32 AM

## 2016-09-14 NOTE — Progress Notes (Signed)
Report given to [facility] at the [place] center.  She verbalized understanding and the patient was later transferred with packet in stable condition.

## 2016-09-14 NOTE — Clinical Social Work Placement (Signed)
   CLINICAL SOCIAL WORK PLACEMENT  NOTE  Date:  09/14/2016  Patient Details  Name: [name] [name] MRN: [id number] Date of Birth: [birth year]  Clinical Social Work is seeking post-discharge placement for this patient at the [place] level of care (*CSW will initial, date and re-position this form in  chart as items are completed):  Yes   Patient/family provided with [place] Work Department's list of facilities offering this level of care within the geographic area requested by the patient (or if unable, by the patient's family).  Yes   Patient/family informed of their freedom to choose among providers that offer the needed level of care, that participate in Medicare, Medicaid or managed care program needed by the patient, have an available bed and are willing to accept the patient.  Yes   Patient/family informed of [place]'s ownership interest in [facility] and [facility], as well as of the fact that they are under no obligation to receive care at these facilities.  PASRR submitted to EDS on       PASRR number received on       Existing PASRR number confirmed on 09/13/16     FL2 transmitted to all facilities in geographic area requested by pt/family on 09/13/16     FL2 transmitted to all facilities within larger geographic area on       Patient informed that his/her managed care company has contracts with or will negotiate with certain facilities, including the following:        Yes   Patient/family informed of bed offers received.  Patient chooses bed at [facility]     Physician recommends and patient chooses bed at      Patient to be transferred to   on  .  Patient to be transferred to facility by       Patient family notified on   of transfer.  Name of family member notified:        PHYSICIAN       Additional Comment:    _______________________________________________ Ihor Gully, LCSW 09/14/2016,  10:41 AM

## 2016-09-14 NOTE — Progress Notes (Signed)
Attempted to call report over to [facility] no answer at this time.  Will retry soon.

## 2016-09-14 NOTE — Discharge Summary (Signed)
Physician Discharge Summary  [name] [name] BHA:[id number] DOB: 02-11-1941 DOA: 09/12/2016  PCP: [name], MD  Admit date: 09/12/2016 Discharge date: 09/14/2016  Admitted From: Home Disposition: SNF  Recommendations for Outpatient Follow-up:  1. Follow up with PCP in 1-2 weeks 2. Follow up with Dr. Phillips Odor in Neurology in 4 weeks 3. Please obtain CMP/CBC in one week 4. Please follow up on the following pending results: Free T4, Free T3; HbA1c still pending  Home Health: No Equipment/Devices: None  Discharge Condition: Stable CODE STATUS: DO NOT RESUSCITATE Diet recommendation: Heart Healthy   Brief/Interim Summary: 76 year old male with a history of chronic diastolic heart failure, presented with several episodes of syncope. Patient reported that he woke up the morning of admission and when he tried to get out of bed, he became increasingly lightheaded and dizzy. He reports falling back in bed and passing out for an unknown period of time. After regaining consciousness, patient called for his wife who attempted to get him up which made him increasingly dizzy again. He described paresthesias in his left lower extremities which has been intermittent for the past week Found to be orthostatic and dehydrated. Further workup revealed subacute/acute CVA. He underwent stroke workup per order set. Neurology consulted and followed. Patient improved and was deemed stable for D/C after being rehydrated. Today patient was no longer orthostatic. Plan is for skilled nursing facility now that workup is complete.  Discharge Diagnoses:  Active Problems:   GASTROESOPHAGEAL REFLUX DISEASE, CHRONIC   Hypothyroidism   Hypokalemia   Chronic diastolic CHF (congestive heart failure) (HCC)   Syncope   Acute CVA (cerebrovascular accident) ([place])   Stroke (cerebrum) ([place])  1. Syncope of unclear etiology.  -Likely related to orthostasis and hypotension and associated with left-sided  headaches -Patient was taking large diuretics prior to admission.  -Cardiac enzymes have been negative.  -Echocardiogram is unremarkable. -Orthostatics improved -CRP was 1.8, ESR wnl, B12 normal, and TSH slightly elevated (Free T4 and Free T3 pending) -Ok to resume Diuretics with Metolazone and Torsemide but a a lower dose (Torsemide 20 mg daily for a 3 days and then re-evaluate to increased) -May need EEG but can be done as an outpatient and follow up with Neurology Dr. Merlene Laughter in 4 weeks   2. 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  -MRI positive for acute infarct.  -Neurology following. Started on aspirin 325 mg daily and Atorvastatin 40 mg qHS.  -This would likely explain his lower extremity symptoms of paresthesias. Carotid Dopplers unremarkable -ECHO done and as below.  -Lipid Panel showed Cholesterol of 179, HDL of 35, LDL of 111, TG of 164 and VLDL of 33 -Seen by Physical Therapy and recommended skilled nursing facility placement. -Follow up with Neurology as an outpatient   3. Hyperglycemia.  -Started on sliding scale insulin while inpatient.  -Hemoglobin A1c pending. -Continue to Monitor and re-evaluate need for medication as an outpatient   4. Chronic Diastolic Heart Failure. - Appears compensated.  -Restart Diuretics at lower dose (Torsemide 20 mg po daily and Zaroxyln 2.5 mg every Wednesday starting next Wednesday in May)  -Patient does have chronic lower extremity edema appears to be near baseline.  5. Hypokalemia.  -Related to diuretics. Improved.  -Repeat CMP as an outpatient  6. Hypothyroidism. -Has Diagnosis but denies taking medication for it -TSH was 5.455 -Free T4 and Free T3 -Repeat TSH and Free T4 and if TSH still elevated and Free   7. Hypertension.  -Will D/C Amlodipine and  have outpatient PCP re-evaluate need -Diuretics start at a reduced dose -Currently Stable.  8. Glaucoma -C/w Home Eye Drops with Brimonidine,  Dorzolamoide-Timolol, and Latanoprost and Liquifirm Tears  9. Hyperlipidemia -Lipid Panel showed Cholesterol of 179, HDL of 35, LDL of 111, TG of 164 and VLDL of 33 -Started patient on Atorvastatin 40 mg daily  10. Leukocytosis  -Improving. WBC 17.1 -> 12.1 -Repeat CBC at SNF   11. Iron Deficiency -C/w Home Iron Supplements   12. AKI  -Improved with IVF Rehydration -Repeat CMP as an outpatient  Discharge Instructions  Discharge Instructions    Call MD for:  difficulty breathing, headache or visual disturbances    Complete by:  As directed    Call MD for:  extreme fatigue    Complete by:  As directed    Call MD for:  persistant dizziness or light-headedness    Complete by:  As directed    Call MD for:  persistant nausea and vomiting    Complete by:  As directed    Call MD for:  severe uncontrolled pain    Complete by:  As directed    Call MD for:  temperature >100.4    Complete by:  As directed    Diet - low sodium heart healthy    Complete by:  As directed    Discharge instructions    Complete by:  As directed    Follow up Care at SNF   Increase activity slowly    Complete by:  As directed      Allergies as of 09/14/2016      Reactions   Aspirin Other (See Comments)   Nausea and upset stomach     Nexium [esomeprazole Magnesium] Other (See Comments)   Patient said it messed his stomach up      Medication List    STOP taking these medications   amLODipine 5 MG tablet Commonly known as:  NORVASC   dexlansoprazole 60 MG capsule Commonly known as:  DEXILANT   GINGER ROOT PO   HYDROmorphone 4 MG tablet Commonly known as:  DILAUDID   potassium chloride SA 20 MEQ tablet Commonly known as:  K-DUR,KLOR-CON     TAKE these medications   acetaminophen 325 MG tablet Commonly known as:  TYLENOL Take 650 mg by mouth every 6 (six) hours as needed.   aspirin 325 MG tablet Take 1 tablet (325 mg total) by mouth daily. Start taking on:  09/15/2016   atorvastatin  40 MG tablet Commonly known as:  LIPITOR Take 1 tablet (40 mg total) by mouth daily at 6 PM.   bisacodyl 5 MG EC tablet Commonly known as:  DULCOLAX Take 1 tablet (5 mg total) by mouth daily.   brimonidine 0.2 % ophthalmic solution Commonly known as:  ALPHAGAN Place 1 drop into the right eye 2 times daily.   calcium citrate 950 MG tablet Commonly known as:  CALCITRATE - dosed in mg elemental calcium Take 200 mg of elemental calcium by mouth daily. Takes 3 times per week   docusate sodium 100 MG capsule Commonly known as:  COLACE Take 200 mg by mouth daily as needed for mild constipation. Reported on 09/11/2015   dorzolamide-timolol 22.3-6.8 MG/ML ophthalmic solution Commonly known as:  COSOPT Place 1 drop into both eyes 2 (two) times daily.   ferrous sulfate 325 (65 FE) MG tablet Take 325 mg by mouth daily with breakfast.   latanoprost 0.005 % ophthalmic solution Commonly known as:  XALATAN Place 1  drop into both eyes at bedtime.   LUBRICANT EYE DROPS 0.4-0.3 % Soln Generic drug:  Polyethyl Glycol-Propyl Glycol Apply 1 drop to eye daily as needed (for lubricant eye drops).   magnesium oxide 400 MG tablet Commonly known as:  MAG-OX Take 400 mg by mouth daily. Takes 2 times per week   metolazone 2.5 MG tablet Commonly known as:  ZAROXOLYN Take 1 tablet (2.5 mg total) by mouth every Wednesday.   metoprolol tartrate 25 MG tablet Commonly known as:  LOPRESSOR Take 1 tablet (25 mg total) by mouth daily.   Oxycodone HCl 10 MG Tabs Take 10 mg by mouth 2 (two) times daily as needed (pain).   pantoprazole 40 MG tablet Commonly known as:  PROTONIX Take 40 mg by mouth 2 (two) times daily.   tiZANidine 4 MG tablet Commonly known as:  ZANAFLEX Take 4 mg by mouth 2 (two) times daily.   torsemide 20 MG tablet Commonly known as:  DEMADEX Take 20 daily for 3 days and then reassess Volume Status and increase to 20 BID after re-evaluation. What changed:  how much to  take  how to take this  when to take this  additional instructions   Vitamin D3 1000 units Caps Take 1 capsule by mouth. Takes 3 times per week      Contact information for after-discharge care    Destination    [facility] .   Specialty:  Skilled Nursing Facility Contact information: [address] [place] [address] [phone]             Allergies  Allergen Reactions  . Aspirin Other (See Comments)    Nausea and upset stomach    . Nexium [Esomeprazole Magnesium] Other (See Comments)    Patient said it messed his stomach up   Consultations:  Neurology Dr. Beryle Beams  Procedures/Studies: Dg Chest 2 View  Result Date: 09/12/2016 CLINICAL DATA:  Dizziness, weakness.  Syncope. EXAM: CHEST  2 VIEW COMPARISON:  03/22/2016 FINDINGS: Low lung volumes. No confluent opacities or effusions. Heart is normal size. No acute bony abnormality. IMPRESSION: Low lung volumes.  No acute findings. Electronically Signed   By: Charlett Nose M.D.   On: 09/12/2016 11:51   Ct Head Wo Contrast  Result Date: 09/12/2016 CLINICAL DATA:  Syncope, dizziness EXAM: CT HEAD WITHOUT CONTRAST TECHNIQUE: Contiguous axial images were obtained from the base of the skull through the vertex without intravenous contrast. COMPARISON:  03/22/2016 FINDINGS: Brain: No evidence of acute infarction, hemorrhage, hydrocephalus, extra-axial collection or mass lesion/mass effect. Mild age related atrophy. Vascular: No hyperdense vessel or unexpected calcification. Skull: Normal. Negative for fracture or focal lesion. Sinuses/Orbits: The visualized paranasal sinuses are essentially clear. The mastoid air cells are unopacified. Other: None. IMPRESSION: Normal head CT. Electronically Signed   By: Charline Bills M.D.   On: 09/12/2016 12:11   Mri Brain Without Contrast  Result Date: 09/12/2016 CLINICAL DATA:  76 y/o M; left-sided headache, left-sided chest pain, and numbness in  third through fifth toes on the left foot. EXAM: MRI HEAD WITHOUT CONTRAST TECHNIQUE: Multiplanar, multiecho pulse sequences of the brain and surrounding structures were obtained without intravenous contrast. COMPARISON:  09/12/2016 CT of the head. FINDINGS: Brain: Punctate focus of low diffusion within the right posterior limb of internal capsule (series 5, image 50) probably represents a tiny acute infarction. Few foci of T2 FLAIR hyperintense signal abnormality in periventricular white matter are compatible with minimal chronic microvascular ischemic changes. No  significant brain parenchymal volume loss. No abnormal susceptibility hypointensity to indicate intracranial hemorrhage. No hydrocephalus or extra-axial collection. No effacement of basilar cisterns. No focal mass effect. Vascular: Normal flow voids. Skull and upper cervical spine: Normal marrow signal. Sinuses/Orbits: Partial opacification of right posterior ethmoid air cells. No abnormal signal of the mastoid air cells. Bilateral intra-ocular lens replacement. Other: None. IMPRESSION: 1. Punctate focus of low diffusion within right posterior limb of internal capsule on coronal diffusion weighted sequence probably represents a tiny acute infarction. No associated mass effect or hemorrhage. 2. Minimal chronic microvascular ischemic changes of the brain. 3. Mild ethmoid sinus disease. These results will be called to the ordering clinician or representative by the Radiologist Assistant, and communication documented in the PACS or zVision Dashboard. Electronically Signed   By: Kristine Garbe M.D.   On: 09/12/2016 18:53   US Carotid Bilateral (at [facility] Only)  Result Date: 09/14/2016 CLINICAL DATA:  Stroke 3 days ago. Syncopal episode. History of hypertension, diabetes and smoking. EXAM: BILATERAL CAROTID DUPLEX ULTRASOUND TECHNIQUE: Pearline Cables scale imaging, color Doppler and duplex ultrasound were performed of bilateral carotid and vertebral  arteries in the neck. COMPARISON:  Carotid Doppler ultrasound - 06/02/2014 FINDINGS: Criteria: Quantification of carotid stenosis is based on velocity parameters that correlate the residual internal carotid diameter with NASCET-based stenosis levels, using the diameter of the distal internal carotid lumen as the denominator for stenosis measurement. The following velocity measurements were obtained: RIGHT ICA:  76/15 cm/sec CCA:  253/66 cm/sec SYSTOLIC ICA/CCA RATIO:  1.0 DIASTOLIC ICA/CCA RATIO:  0.5 ECA:  135 cm/sec LEFT ICA:  112/14 cm/sec CCA:  440/3 cm/sec SYSTOLIC ICA/CCA RATIO:  0.9 DIASTOLIC ICA/CCA RATIO:  0.8 ECA:  114 cm/sec RIGHT CAROTID ARTERY: There is a minimal amount of extended mixed echogenic plaque involving the origin of the right internal carotid artery (image 25), not resulting in elevated peak systolic velocities within the interrogated course the right internal carotid artery to suggest a hemodynamically significant stenosis. RIGHT VERTEBRAL ARTERY:  Antegrade flow LEFT CAROTID ARTERY: There is no grayscale evidence of significant intimal thickening affecting the interrogated portions of the left carotid system. There are no elevated peak systolic velocity within the interrogated course the left internal carotid artery to suggest a hemodynamically significant stenosis. LEFT VERTEBRAL ARTERY:  Antegrade Flow IMPRESSION: 1. Minimal amount of right-sided atherosclerotic plaque, not resulting in a hemodynamically significant stenosis. 2. Unremarkable sonographic evaluation of the left carotid system. Electronically Signed   By: Sandi Mariscal M.D.   On: 09/14/2016 08:58    ECHOCARDIOGRAM  Study Conclusions  - Left ventricle: The cavity size was normal. Wall thickness was   increased in a pattern of mild LVH. Systolic function was normal.   The estimated ejection fraction was in the range of 60% to 65%.   Wall motion was normal; there were no regional wall motion   abnormalities. Left  ventricular diastolic function parameters   were normal. - Aortic valve: Mildly calcified annulus. Trileaflet; mildly   calcified leaflets. - Mitral valve: There was trivial regurgitation. - Right atrium: Central venous pressure (est): 3 mm Hg. - Tricuspid valve: There was trivial regurgitation. - Pulmonary arteries: PA peak pressure: 31 mm Hg (S). - Pericardium, extracardiac: There was no pericardial effusion.  Impressions:  - Mild LVH with LVEF 60-65% and grossly normal diastolic function.   Trivial mitral regurgitation. Mildly sclerotic aortic valve.   Trivial tricuspid regurgitation with PASP estimated 31 mmHg,   normal range.  CAROTID DOPPLERS  RIGHT CAROTID ARTERY: There is a minimal amount of extended mixed echogenic plaque involving the origin of the right internal carotid artery (image 25), not resulting in elevated peak systolic velocities within the interrogated course the right internal carotid artery to suggest a hemodynamically significant stenosis.  RIGHT VERTEBRAL ARTERY:  Antegrade flow  LEFT CAROTID ARTERY: There is no grayscale evidence of significant intimal thickening affecting the interrogated portions of the left carotid system. There are no elevated peak systolic velocity within the interrogated course the left internal carotid artery to suggest a hemodynamically significant stenosis.  LEFT VERTEBRAL ARTERY:  Antegrade Flow  IMPRESSION: 1. Minimal amount of right-sided atherosclerotic plaque, not resulting in a hemodynamically significant stenosis. 2. Unremarkable sonographic evaluation of the left carotid system.  Subjective:  Seen and examined at bedside and was doing better. No nausea or vomiting but did have a left-sided headache. Orthostatics done early this AM were negative. Discussed case with Dr. Merlene Laughter and from Neurology standpoint patient ok to be d/c'd to SNF and follow up in a month.   Discharge Exam: Vitals:   09/14/16 0200  09/14/16 0600  BP: 110/64 (!) 133/53  Pulse: 74 74  Resp: 12 17  Temp: 98.1 F (36.7 C) 98 F (36.7 C)   Vitals:   09/13/16 1800 09/13/16 2200 09/14/16 0200 09/14/16 0600  BP: (!) 98/52 115/66 110/64 (!) 133/53  Pulse: 77 75 74 74  Resp: '18 20 12 17  '$ Temp: 98.4 F (36.9 C) 98.3 F (36.8 C) 98.1 F (36.7 C) 98 F (36.7 C)  TempSrc: Oral Oral Oral   SpO2: 94% 93% 94% 96%  Weight:      Height:       General: Pt is alert, awake, not in acute distress Cardiovascular: RRR, S1/S2 +, no rubs, no gallops Respiratory: CTA bilaterally, no wheezing, no rhonchi; Patient not tachypenic or using any accessory muscles to breathe. Abdominal: Soft, NT, ND, bowel sounds + Extremities: Mild Lower Extremityedema, no cyanosis  The results of significant diagnostics from this hospitalization (including imaging, microbiology, ancillary and laboratory) are listed below for reference.    Microbiology: No results found for this or any previous visit (from the past 240 hour(s)).   Labs: BNP (last 3 results)  Recent Labs  10/19/15 1216 03/22/16 0832  BNP 12.0 84.1   Basic Metabolic Panel:  Recent Labs Lab 09/12/16 1029 09/12/16 1040 09/12/16 1757 09/13/16 0452 09/14/16 0625  NA 135 136  --  140 140  K 2.4* 2.4*  --  3.8 3.6  CL 86* 87*  --  99* 101  CO2 37*  --   --  32 34*  GLUCOSE 153* 151*  --  152* 136*  BUN 32* 32*  --  25* 19  CREATININE 1.41* 1.60* 1.39* 1.36* 1.11  CALCIUM 10.0  --   --  8.9 8.9  MG  --   --   --  2.3  --    Liver Function Tests:  Recent Labs Lab 09/12/16 1029  AST 21  ALT 18  ALKPHOS 67  BILITOT 1.3*  PROT 7.0  ALBUMIN 3.7   No results for input(s): LIPASE, AMYLASE in the last 168 hours. No results for input(s): AMMONIA in the last 168 hours. CBC:  Recent Labs Lab 09/12/16 1029 09/12/16 1040 09/12/16 1757 09/13/16 0452 09/14/16 0625  WBC 15.6*  --  17.1* 15.1* 12.1*  NEUTROABS 13.3*  --   --   --   --   HGB 16.6 17.3* 15.7  13.8 14.0   HCT 48.4 51.0 46.1 42.1 42.5  MCV 76.1*  --  76.6* 76.8* 78.0  PLT 141*  --  133* 123* 102*   Cardiac Enzymes:  Recent Labs Lab 09/12/16 1757 09/12/16 2255 09/13/16 0452  TROPONINI <0.03 <0.03 <0.03   BNP: Invalid input(s): POCBNP CBG:  Recent Labs Lab 09/13/16 0757 09/13/16 1642 09/13/16 2202 09/14/16 0740 09/14/16 1209  GLUCAP 133* 143* 198* 132* 144*   D-Dimer No results for input(s): DDIMER in the last 72 hours. Hgb A1c No results for input(s): HGBA1C in the last 72 hours. Lipid Profile  Recent Labs  09/14/16 0623  CHOL 179  HDL 35*  LDLCALC 111*  TRIG 164*  CHOLHDL 5.1   Thyroid function studies  Recent Labs  09/12/16 1757  TSH 5.455*   Anemia work up  Recent Labs  09/13/16 1045  VITAMINB12 589   Urinalysis    Component Value Date/Time   COLORURINE YELLOW 09/12/2016 1050   APPEARANCEUR CLEAR 09/12/2016 1050   LABSPEC 1.008 09/12/2016 1050   PHURINE 7.0 09/12/2016 1050   GLUCOSEU NEGATIVE 09/12/2016 1050   HGBUR NEGATIVE 09/12/2016 [place] 09/12/2016 1050   KETONESUR NEGATIVE 09/12/2016 1050   PROTEINUR NEGATIVE 09/12/2016 1050   UROBILINOGEN 0.2 09/01/2013 1312   NITRITE NEGATIVE 09/12/2016 [place] 09/12/2016 1050   Sepsis Labs Invalid input(s): PROCALCITONIN,  WBC,  LACTICIDVEN Microbiology No results found for this or any previous visit (from the past 240 hour(s)).  Time coordinating discharge: 35 minutes  SIGNED:  Kerney Elbe, DO [facility] 09/14/2016, 12:45 PM Pager [phone]  If 7PM-7AM, please contact night-coverage [email or web address] Password [email or web address]

## 2016-09-15 ENCOUNTER — Other Ambulatory Visit (HOSPITAL_COMMUNITY)
Admission: RE | Admit: 2016-09-15 | Discharge: 2016-09-15 | Disposition: A | Discharge status: Home / Self Care | Payer: PPO | Source: Skilled Nursing Facility | Attending: Internal Medicine | Admitting: Internal Medicine

## 2016-09-15 ENCOUNTER — Non-Acute Institutional Stay (SKILLED_NURSING_FACILITY): Payer: PPO | Admitting: Internal Medicine

## 2016-09-15 ENCOUNTER — Encounter: Payer: Self-pay | Admitting: Internal Medicine

## 2016-09-15 DIAGNOSIS — E039 Hypothyroidism, unspecified: Secondary | ICD-10-CM

## 2016-09-15 DIAGNOSIS — I5032 Chronic diastolic (congestive) heart failure: Secondary | ICD-10-CM | POA: Diagnosis not present

## 2016-09-15 DIAGNOSIS — D72829 Elevated white blood cell count, unspecified: Secondary | ICD-10-CM

## 2016-09-15 DIAGNOSIS — I639 Cerebral infarction, unspecified: Secondary | ICD-10-CM

## 2016-09-15 DIAGNOSIS — R55 Syncope and collapse: Secondary | ICD-10-CM

## 2016-09-15 DIAGNOSIS — E611 Iron deficiency: Secondary | ICD-10-CM | POA: Diagnosis not present

## 2016-09-15 DIAGNOSIS — I10 Essential (primary) hypertension: Secondary | ICD-10-CM | POA: Insufficient documentation

## 2016-09-15 LAB — COMPREHENSIVE METABOLIC PANEL
ALK PHOS: 58 U/L (ref 38–126)
ALT: 13 U/L — ABNORMAL LOW (ref 17–63)
AST: 17 U/L (ref 15–41)
Albumin: 2.9 g/dL — ABNORMAL LOW (ref 3.5–5.0)
Anion gap: 9 (ref 5–15)
BILIRUBIN TOTAL: 0.6 mg/dL (ref 0.3–1.2)
BUN: 17 mg/dL (ref 6–20)
CALCIUM: 8.9 mg/dL (ref 8.9–10.3)
CO2: 28 mmol/L (ref 22–32)
Chloride: 98 mmol/L — ABNORMAL LOW (ref 101–111)
Creatinine, Ser: 0.99 mg/dL (ref 0.61–1.24)
GFR calc non Af Amer: >60 mL/min (ref >60)
Glucose, Bld: 149 mg/dL — ABNORMAL HIGH (ref 65–99)
Potassium: 3 mmol/L — ABNORMAL LOW (ref 3.5–5.1)
Sodium: 135 mmol/L (ref 135–145)
TOTAL PROTEIN: 5.6 g/dL — AB (ref 6.5–8.1)

## 2016-09-15 LAB — URINALYSIS, ROUTINE W REFLEX MICROSCOPIC
Bilirubin Urine: NEGATIVE
GLUCOSE, UA: NEGATIVE mg/dL
Hgb urine dipstick: NEGATIVE
KETONES UR: NEGATIVE mg/dL
LEUKOCYTES UA: NEGATIVE
NITRITE: NEGATIVE
PROTEIN: NEGATIVE mg/dL
Specific Gravity, Urine: 1.013 (ref 1.005–1.030)
pH: 5 (ref 5.0–8.0)

## 2016-09-15 LAB — HEMOGLOBIN A1C
HEMOGLOBIN A1C: 6.4 % — AB (ref 4.8–5.6)
Mean Plasma Glucose: 137 mg/dL

## 2016-09-15 LAB — CBC
HEMATOCRIT: 44 % (ref 39.0–52.0)
HEMOGLOBIN: 14.4 g/dL (ref 13.0–17.0)
MCH: 25.4 pg — AB (ref 26.0–34.0)
MCHC: 32.7 g/dL (ref 30.0–36.0)
MCV: 77.7 fL — ABNORMAL LOW (ref 78.0–100.0)
Platelets: 111 10*3/uL — ABNORMAL LOW (ref 150–400)
RBC: 5.66 MIL/uL (ref 4.22–5.81)
RDW: 19.6 % — ABNORMAL HIGH (ref 11.5–15.5)
WBC: 14.4 10*3/uL — ABNORMAL HIGH (ref 4.0–10.5)

## 2016-09-15 LAB — T3, FREE: T3, Free: 2.1 pg/mL (ref 2.0–4.4)

## 2016-09-15 NOTE — Progress Notes (Signed)
Location:   [facility] Room Number: 131/P Place of Service:  SNF [phone]) Provider:  Amado Coe, MD  Patient Care Team: Redmond School, MD as PCP - General (Internal Medicine) Daneil Dolin, MD (Gastroenterology) Arnoldo Lenis, MD as Consulting Physician (Cardiology)  Extended Emergency Contact Information Primary Emergency Contact: Phang,Vincent Address: [address]          [place],  [address] Johnnette Litter of [place] Phone: [phone] Mobile Phone: [phone] Relation: Son Secondary Emergency Contact: Catherine of [place] Phone: [phone] Relation: Grandaughter  Code Status:  DNR Goals of care: Advanced Directive information Advanced Directives 09/15/2016  Does Patient Have a Medical Advance Directive? Yes  Type of Advance Directive Out of facility DNR (pink MOST or yellow form)  Does patient want to make changes to medical advance directive? No - Patient declined  Copy of [place] in Chart? -  Would patient like information on creating a medical advance directive? No - Patient declined  Pre-existing out of facility DNR order (yellow form or pink MOST form) -     Chief Complaint  Patient presents with  . Acute Visit  Status post hospitalization for CVA-syncope-  HPI:  Pt is a 76 y.o. male seen today for an acute visit for admission to skilled nursing after hospitalization -1 initially presented as an episode of syncope-.  Apparently he woke up lightheaded and dizzy when he tried to get out of bed and fell.  He lost consciousness and after regaining consciousness his wife attempted to get him up but he got increasingly dizzy again.  Described numbness in his left lower extremity.  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  Neurology was consulted-he was started on aspirin 325 mg a day and atorvastatin 40 mg daily at  bedtime.  Syncope was thought to be on etiologies possibly orthostats his hypotension with left-sided headaches.  He was taking a large monitored diuretics-.  His echocardiogram was unremarkable as well as cardiac enzymes.  His diuretics have been started at a lower dose currently on torsemide 20 mg a day as well as metolazone 2.5 mg every week.  He does have a history of hypoglycemia he was started on sliding scale insulin blood sugars so far in facility of been in the 100s range.  Regards chronic diastolic CHF again he is on torsemide 20 mg a day metolazone once a week-recommendation is possibly increase his torsemide after 3 days  He also has a history of leukocytosis and was trending down and hospital it is slightly up today at 14.4 had ranged from 12.1 up to 17.1 in the hospital.  Currently he has no acute complaints-says at times he will have short lived episodes of diaphoresis --t this is been occurring since his CVA and has not gotten any worse over the last few days.  He is not complaining of any fever chills or increased chest congestion or cough.  He denies dysuria as well as.  He is here for rehabilitation   I do note on lab today potassium is low at 3.0 this will need to be aggressively supplemented       Past Medical History:  Diagnosis Date  . Bilateral lower leg cellulitis   . BPH (benign prostatic hyperplasia)   . Cataract    left  . CHF (congestive heart failure) ([place])   . Chronic diastolic HF (heart failure) (  [place])   . Collagen vascular disease ([place])    history of venous insufficency and LE cellulitis  . Complication of anesthesia    2012 due to sleep apnea pt has had difficulty being put under  and waking up from anesthesia  . Deep venous insufficiency   . GERD (gastroesophageal reflux disease)   . Glaucoma    left  . Headache   . Heel spur    right heel  . History of bladder infections   . History of colonic polyps   . Hyperlipidemia   .  Hypertension    Sees Dr. Debara Pickett  . Hypothyroidism    hypothyroid denies no rx  . Iron deficiency 07/07/2016  . Kidney stones    passed them  . Obesity   . Osteoarthritis   . PONV (postoperative nausea and vomiting)   . PUD (peptic ulcer disease)   . Ringing in ears   . S/P colonoscopy 2007, Feb and March 2012   hx of adenomas, left-sided diverticula, On 07/2010 TCS, fresh blood and clot noted coming from TI.  . S/P endoscopy 2007, 2012   2007: nl, May 2012: antral erosions  . SIRS (systemic inflammatory response syndrome) ([place]) 02/27/2012  . Sleep apnea    uses cpap  occ "unable to use lately due to nasal pain when use" - sleep study 2008 AHI 48.80/hr and 56.70hr during REM  . Thrombocytopenia ([place]) 12/30/2011   Stable   Past Surgical History:  Procedure Laterality Date  . BACK SURGERY    . CARDIAC CATHETERIZATION  02/27/2007   no sign of CAD, LVH, mod pulm HTN (Dr. Jackie Plum(  . CATARACT EXTRACTION W/ INTRAOCULAR LENS IMPLANT Right ~ 2012  . COLONOSCOPY N/A 05/28/2014   NLG:XQJJHERD colonic polyps/colonic diverticulosis. Tubular adenomas. Next colonoscopy January 2021  . ESOPHAGOGASTRODUODENOSCOPY  Aug 2013   [facility], single 65mm pedunculated polyp otherwise normal. HYPERPLASTIC, NEGATIVE h.pylori  . ESOPHAGOGASTRODUODENOSCOPY N/A 05/28/2014   RMR:non-critical Schatzki's ring/HH. Benign gastritis  . INGUINAL HERNIA REPAIR Right 1941  . KNEE ARTHROSCOPY Left ~ 2000  . LAPAROSCOPIC CHOLECYSTECTOMY    . [place] SURGERY  2012  . MALONEY DILATION N/A 05/28/2014   Procedure: Venia Minks DILATION;  Surgeon: Daneil Dolin, MD;  [facility];  Service: Endoscopy;  Laterality: N/A;  . NASAL SEPTOPLASTY W/ TURBINOPLASTY Bilateral 10/07/2015   Procedure: NASAL SEPTOPLASTY WITH TURBINATE REDUCTION;  Surgeon: Leta Baptist, MD;  Location: [facility];  Service: ENT;  Laterality: Bilateral;  . NASAL SEPTUM SURGERY Bilateral 10/07/2015    inferior turbinate resection  . NM MYOCAR PERF WALL MOTION  12/2006    dipyridamole myoview - stress images show mild perfusion defect in mid inferior walls with reversibility at rest; mild perfusion defect in mid inferolateral wall at stress with mild defect reversibility, EF 62%, abnormal but low risk study   . POSTERIOR LUMBAR FUSION  2015  . SAVORY DILATION N/A 05/28/2014   Procedure: SAVORY DILATION;  Surgeon: Daneil Dolin, MD;  Location: [facility];  Service: Endoscopy;  Laterality: N/A;  . SHOULDER OPEN ROTATOR CUFF REPAIR Left early 2000s  . small bowel capsule study  07/2011   ?transient focal ischemia in distal small bowel to explain GI bleeding  . TRANSTHORACIC ECHOCARDIOGRAM  09/2008   EF 60-65%, mod conc LVH  . TRANSURETHRAL RESECTION OF PROSTATE    . TRANSURETHRAL RESECTION OF PROSTATE N/A 06/18/2013   Procedure: TRANSURETHRAL RESECTION OF THE PROSTATE (TURP);  Surgeon: Marissa Nestle, MD;  Location:  [facility];  Service: Urology;  Laterality: N/A;    Allergies  Allergen Reactions  . Aspirin Other (See Comments)    Nausea and upset stomach    . Nexium [Esomeprazole Magnesium] Other (See Comments)    Patient said it messed his stomach up    Outpatient Encounter Prescriptions as of 09/15/2016  Medication Sig  . acetaminophen (TYLENOL) 325 MG tablet Take 650 mg by mouth every 6 (six) hours as needed.  Marland Kitchen aspirin 325 MG tablet Take 1 tablet (325 mg total) by mouth daily.  Marland Kitchen atorvastatin (LIPITOR) 40 MG tablet Take 1 tablet (40 mg total) by mouth daily at 6 PM.  . bisacodyl (DULCOLAX) 5 MG EC tablet Take 1 tablet (5 mg total) by mouth daily.  . brimonidine (ALPHAGAN) 0.2 % ophthalmic solution Place 1 drop into the right eye 2 times daily.  . calcium citrate (CALCITRATE - DOSED IN MG ELEMENTAL CALCIUM) 950 MG tablet Take 200 mg of elemental calcium by mouth daily. Takes 3 times per week  . Cholecalciferol (VITAMIN D3) 1000 units CAPS Take 1 capsule by mouth. Takes 3 times per week  . docusate sodium (COLACE) 100 MG capsule Take 200 mg by mouth daily  as needed for mild constipation. Reported on 09/11/2015  . dorzolamide-timolol (COSOPT) 22.3-6.8 MG/ML ophthalmic solution Place 1 drop into both eyes 2 (two) times daily.  . ferrous sulfate 325 (65 FE) MG tablet Take 325 mg by mouth daily with breakfast.  . latanoprost (XALATAN) 0.005 % ophthalmic solution Place 1 drop into both eyes at bedtime.  . magnesium oxide (MAG-OX) 400 MG tablet Take 400 mg by mouth daily. Takes 2 times per week  . metolazone (ZAROXOLYN) 2.5 MG tablet Take 1 tablet (2.5 mg total) by mouth every Wednesday.  . metoprolol tartrate (LOPRESSOR) 25 MG tablet Take 1 tablet (25 mg total) by mouth daily.  . Oxycodone HCl 10 MG TABS Take 10 mg by mouth 2 (two) times daily as needed (pain).   . pantoprazole (PROTONIX) 40 MG tablet Take 40 mg by mouth 2 (two) times daily.  Vladimir Faster Glycol-Propyl Glycol (LUBRICANT EYE DROPS) 0.4-0.3 % SOLN Apply 1 drop to eye daily as needed (for lubricant eye drops).  Marland Kitchen tiZANidine (ZANAFLEX) 4 MG tablet Take 4 mg by mouth 2 (two) times daily.   Marland Kitchen torsemide (DEMADEX) 20 MG tablet Take 20 daily for 3 days and then reassess Volume Status and increase to 20 BID after re-evaluation.  . [DISCONTINUED] simethicone (MYLICON) 80 MG chewable tablet Chew 1 tablet (80 mg total) by mouth 4 (four) times daily as needed for flatulence.   No facility-administered encounter medications on file as of 09/15/2016.     Review of Systems   In general he complains of no fever or chills says he has intermittent episodes of diaphoresis since his CVA.  Skin does not complain of rashes or itching occasional diaphoresis as noted above.  Head ears eyes nose mouth and throat does not complaining of any difficulty swallowing visual changes or sore throat.  Respiratory is not complaining of shortness breath or cough.  Cardiac denies chest pain does have some mild lower extremity edema bilaterally.  GI is not complaining of nausea vomiting diarrhea or constipation or  abdominal discomfort.  GU denies dysuria.  Musculoskeletal has weakness but does not complain of joint pain at this time.  Neurologic says he's had intermittent dizziness at times since CVA-- does not complain of headache at this time says numbness left side apparently is  improving somewhat.  Psych is not complaining of any depression or anxiety  Immunization History  Administered Date(s) Administered  . Influenza Split 02/28/2012, 03/21/2014  . Influenza-Unspecified 12/22/2014  . Tdap 03/06/2014   Pertinent  Health Maintenance Due  Topic Date Due  . INFLUENZA VACCINE  04/11/2017 (Originally 12/21/2016)  . PNA vac Low Risk Adult (1 of 2 - PCV13) 04/22/2017 (Originally 11/17/2004)   Fall Risk  07/05/2016 02/05/2016 08/19/2014  Falls in the past year? No Yes Yes  Number falls in past yr: - 1 1  Injury with Fall? - No -  Risk for fall due to : Other (Comment) Impaired balance/gait;Impaired mobility Impaired mobility  Follow up - Falls evaluation completed;Education provided;Falls prevention discussed -   Functional Status Survey:    Vitals:   09/15/16 1416  BP: (!) 149/85  Pulse: 76  Resp: 20  Temp: 98.2 F (36.8 C)  TempSrc: Oral   There is no height or weight on file to calculate BMI. Physical Exam Gen. this is a pleasant elderly male in no distress lying comfortably in bed.  His skin is warm and dry he is not diaphoretic currently.  Eyes pupils appear reactive light sclera and conjunctiva are clear visual acuity appears grossly intact.  Oropharynx is clear mucous membranes moist tongue is midline.  Chest is clear to auscultation there is no labored breathing.  Heart is regular rate and rhythm without murmur gallop or rub he has mild lower extremity edema bilaterally with venous stasis changes.  His abdomen is obese soft nontender with positive bowel sounds.  Muscle skeletal moves all extremities 4 gross strength appears to be intact bilaterally does have some lower  extremity weakness although this could be due to deconditioning upper extremity strength appears preserved.  Neurologic is grossly intact to speech is clear no lateralizing findings appears to possibly have some touch deficits of the left lower  foot at this time  Psych he is alert and oriented pleasant and appropriate.   Labs reviewed:  Recent Labs  03/24/16 0620  03/26/16 0504  09/13/16 0452 09/14/16 0625 09/15/16 0405  NA 137  --  135  < > 140 140 135  K 3.6  < > 3.4*  < > 3.8 3.6 3.0*  CL 94*  --  89*  < > 99* 101 98*  CO2 37*  --  36*  < > 32 34* 28  GLUCOSE 105*  --  149*  < > 152* 136* 149*  BUN 22*  --  20  < > 25* 19 17  CREATININE 1.13  --  1.35*  < > 1.36* 1.11 0.99  CALCIUM 9.0  --  9.2  < > 8.9 8.9 8.9  MG 2.1  --  2.0  --  2.3  --   --   < > = values in this interval not displayed.  Recent Labs  03/29/16 1337 09/12/16 1029 09/15/16 0405  AST 23 21 17   ALT 16* 18 13*  ALKPHOS 66 67 58  BILITOT 0.8 1.3* 0.6  PROT 6.7 7.0 5.6*  ALBUMIN 3.6 3.7 2.9*    Recent Labs  03/29/16 1337 08/09/16 0949 09/12/16 1029  09/13/16 0452 09/14/16 0625 09/15/16 0405  WBC 13.5* 20.6* 15.6*  < > 15.1* 12.1* 14.4*  NEUTROABS 11.4* 17.3* 13.3*  --   --   --   --   HGB 15.0 16.2 16.6  < > 13.8 14.0 14.4  HCT 45.4 48.0 48.4  < > 42.1 42.5 44.0  MCV 78.3 73.7* 76.1*  < > 76.8* 78.0 77.7*  PLT 131* 164 141*  < > 123* 102* 111*  < > = values in this interval not displayed. Lab Results  Component Value Date   TSH 5.455 (H) 09/12/2016   Lab Results  Component Value Date   HGBA1C 6.4 (H) 09/13/2016   Lab Results  Component Value Date   CHOL 179 09/14/2016   HDL 35 (L) 09/14/2016   LDLCALC 111 (H) 09/14/2016   TRIG 164 (H) 09/14/2016   CHOLHDL 5.1 09/14/2016    Significant Diagnostic Results in last 30 days:  Dg Chest 2 View  Result Date: 09/12/2016 CLINICAL DATA:  Dizziness, weakness.  Syncope. EXAM: CHEST  2 VIEW COMPARISON:  03/22/2016 FINDINGS: Low lung  volumes. No confluent opacities or effusions. Heart is normal size. No acute bony abnormality. IMPRESSION: Low lung volumes.  No acute findings. Electronically Signed   By: Rolm Baptise M.D.   On: 09/12/2016 11:51   Ct Head Wo Contrast  Result Date: 09/12/2016 CLINICAL DATA:  Syncope, dizziness EXAM: CT HEAD WITHOUT CONTRAST TECHNIQUE: Contiguous axial images were obtained from the base of the skull through the vertex without intravenous contrast. COMPARISON:  03/22/2016 FINDINGS: Brain: No evidence of acute infarction, hemorrhage, hydrocephalus, extra-axial collection or mass lesion/mass effect. Mild age related atrophy. Vascular: No hyperdense vessel or unexpected calcification. Skull: Normal. Negative for fracture or focal lesion. Sinuses/Orbits: The visualized paranasal sinuses are essentially clear. The mastoid air cells are unopacified. Other: None. IMPRESSION: Normal head CT. Electronically Signed   By: Julian Hy M.D.   On: 09/12/2016 12:11   Mri Brain Without Contrast  Result Date: 09/12/2016 CLINICAL DATA:  76 y/o M; left-sided headache, left-sided chest pain, and numbness in third through fifth toes on the left foot. EXAM: MRI HEAD WITHOUT CONTRAST TECHNIQUE: Multiplanar, multiecho pulse sequences of the brain and surrounding structures were obtained without intravenous contrast. COMPARISON:  09/12/2016 CT of the head. FINDINGS: Brain: Punctate focus of low diffusion within the right posterior limb of internal capsule (series 5, image 50) probably represents a tiny acute infarction. Few foci of T2 FLAIR hyperintense signal abnormality in periventricular white matter are compatible with minimal chronic microvascular ischemic changes. No significant brain parenchymal volume loss. No abnormal susceptibility hypointensity to indicate intracranial hemorrhage. No hydrocephalus or extra-axial collection. No effacement of basilar cisterns. No focal mass effect. Vascular: Normal flow voids. Skull  and upper cervical spine: Normal marrow signal. Sinuses/Orbits: Partial opacification of right posterior ethmoid air cells. No abnormal signal of the mastoid air cells. Bilateral intra-ocular lens replacement. Other: None. IMPRESSION: 1. Punctate focus of low diffusion within right posterior limb of internal capsule on coronal diffusion weighted sequence probably represents a tiny acute infarction. No associated mass effect or hemorrhage. 2. Minimal chronic microvascular ischemic changes of the brain. 3. Mild ethmoid sinus disease. These results will be called to the ordering clinician or representative by the Radiologist Assistant, and communication documented in the PACS or zVision Dashboard. Electronically Signed   By: Kristine Garbe M.D.   On: 09/12/2016 18:53   US Carotid Bilateral (at [facility] Only)  Result Date: 09/14/2016 CLINICAL DATA:  Stroke 3 days ago. Syncopal episode. History of hypertension, diabetes and smoking. EXAM: BILATERAL CAROTID DUPLEX ULTRASOUND TECHNIQUE: Pearline Cables scale imaging, color Doppler and duplex ultrasound were performed of bilateral carotid and vertebral arteries in the neck. COMPARISON:  Carotid Doppler ultrasound - 06/02/2014 FINDINGS: Criteria: Quantification of carotid stenosis is based on velocity parameters  that correlate the residual internal carotid diameter with NASCET-based stenosis levels, using the diameter of the distal internal carotid lumen as the denominator for stenosis measurement. The following velocity measurements were obtained: RIGHT ICA:  76/15 cm/sec CCA:  202/54 cm/sec SYSTOLIC ICA/CCA RATIO:  1.0 DIASTOLIC ICA/CCA RATIO:  0.5 ECA:  135 cm/sec LEFT ICA:  112/14 cm/sec CCA:  270/6 cm/sec SYSTOLIC ICA/CCA RATIO:  0.9 DIASTOLIC ICA/CCA RATIO:  0.8 ECA:  114 cm/sec RIGHT CAROTID ARTERY: There is a minimal amount of extended mixed echogenic plaque involving the origin of the right internal carotid artery (image 25), not resulting in elevated peak  systolic velocities within the interrogated course the right internal carotid artery to suggest a hemodynamically significant stenosis. RIGHT VERTEBRAL ARTERY:  Antegrade flow LEFT CAROTID ARTERY: There is no grayscale evidence of significant intimal thickening affecting the interrogated portions of the left carotid system. There are no elevated peak systolic velocity within the interrogated course the left internal carotid artery to suggest a hemodynamically significant stenosis. LEFT VERTEBRAL ARTERY:  Antegrade Flow IMPRESSION: 1. Minimal amount of right-sided atherosclerotic plaque, not resulting in a hemodynamically significant stenosis. 2. Unremarkable sonographic evaluation of the left carotid system. Electronically Signed   By: Sandi Mariscal M.D.   On: 09/14/2016 08:58    Assessment/Plan  1 history of subacute-acute CVA-regular the right internal capsule-he has been evaluated by neurology is on aspirin 325 mg a day as well as a statin atorvastatin 40 mg daily at bedtime.  Cardiac echo and carotid Dopplers were fairly unremarkable-she will need continued therapy for strengthening as well as neurology follow-up.  #2 syncope thought to be multiple etiologies with dehydration hypotension-diuretics have been reduced he's now on torsemide 20 mg a day metolazone Q weekly-at this point this will need to be monitored.  #3 hyperglycemia he is on sliding scale insulin-so far CBGs appear to be more in the 100 range.  #4 hypokalemia-potassium was low at 3.0 will give an additional 40 mEq potassium 2 today-and check a BMP tomorrow he will need it appears fairly aggressive supplementation pending those results. Continues on magnesium supplementation as well  #5 history of hypothyroidism he is on Synthroid TSH was 5.455 hospital--- update a T4 TSH for follow-up  #2-BJSEGBT of diastolic CHF at this point will monitor he is on torsemide as well as metolazone once a week-this appears stable currently. Of note  is on a beta blocker as well  #7 history of leukocytosis this is been variable in the hospital. Be trending up is slightly up today at slightly over 14,000 he does not complain of any significant fever or chills dysuria chest congestion or cough but will update a chest x-ray and a urinalysis and culture also monitor vital signs every 4 hours and if temperature greater than 100.5 up to attain blood cultures.  Also will update a CBC with differential tomorrow.  #8-iron deficiency anemia continues on iron hemoglobin was 14.4 and hospital.  #9 history of acute kidney injury this appears to have improved in the hospital creatinine on discharge was 0.99 BUN of 17 on lab done the day.  #10 history of glaucoma continues on topical eyedrops.  #11 history hyperlipidemia cholesterol was 179 LDL 111-he has been started on atorvastatin 40 mg daily at bedtime.  #12 pain management continues on oxycodone when necessary as well as Zanaflex when necessary at this point appears to be stable.  #51-[id number] systolic is somewhat elevated at 149 today at this point will monitor he continues on  Lopressor-would like to get more readings here before making any changes  [id number]-of note greater than 50  minutes spent assessing patient-reviewing his chart-reviewing his labs-and coordinating and formulating a plan of care for numerous diagnoses-of note greater than 50% of time spent coordinating plan of care     0

## 2016-09-16 ENCOUNTER — Encounter: Payer: Self-pay | Admitting: Internal Medicine

## 2016-09-16 ENCOUNTER — Other Ambulatory Visit: Payer: Self-pay | Admitting: *Deleted

## 2016-09-16 ENCOUNTER — Non-Acute Institutional Stay (SKILLED_NURSING_FACILITY): Payer: PPO | Admitting: Internal Medicine

## 2016-09-16 ENCOUNTER — Other Ambulatory Visit (HOSPITAL_COMMUNITY)
Admission: RE | Admit: 2016-09-16 | Discharge: 2016-09-16 | Disposition: A | Discharge status: Home / Self Care | Payer: PPO | Source: Skilled Nursing Facility | Attending: Internal Medicine | Admitting: Internal Medicine

## 2016-09-16 DIAGNOSIS — I5032 Chronic diastolic (congestive) heart failure: Secondary | ICD-10-CM

## 2016-09-16 DIAGNOSIS — E876 Hypokalemia: Secondary | ICD-10-CM

## 2016-09-16 DIAGNOSIS — D72829 Elevated white blood cell count, unspecified: Secondary | ICD-10-CM

## 2016-09-16 LAB — CBC
HEMATOCRIT: 42.9 % (ref 39.0–52.0)
Hemoglobin: 14.2 g/dL (ref 13.0–17.0)
MCH: 25.7 pg — AB (ref 26.0–34.0)
MCHC: 33.1 g/dL (ref 30.0–36.0)
MCV: 77.6 fL — AB (ref 78.0–100.0)
Platelets: 117 10*3/uL — ABNORMAL LOW (ref 150–400)
RBC: 5.53 MIL/uL (ref 4.22–5.81)
RDW: 19.5 % — AB (ref 11.5–15.5)
WBC: 17 10*3/uL — ABNORMAL HIGH (ref 4.0–10.5)

## 2016-09-16 LAB — DIFFERENTIAL
BASOS ABS: 0 10*3/uL (ref 0.0–0.1)
BASOS PCT: 0 %
EOS ABS: 0.1 10*3/uL (ref 0.0–0.7)
Eosinophils Relative: 0 %
Lymphocytes Relative: 7 %
Lymphs Abs: 1.2 10*3/uL (ref 0.7–4.0)
Monocytes Absolute: 1.5 10*3/uL — ABNORMAL HIGH (ref 0.1–1.0)
Monocytes Relative: 9 %
NEUTROS ABS: 14.2 10*3/uL — AB (ref 1.7–7.7)
NEUTROS PCT: 84 %

## 2016-09-16 LAB — BASIC METABOLIC PANEL
ANION GAP: 10 (ref 5–15)
BUN: 22 mg/dL — ABNORMAL HIGH (ref 6–20)
CHLORIDE: 98 mmol/L — AB (ref 101–111)
CO2: 28 mmol/L (ref 22–32)
Calcium: 8.8 mg/dL — ABNORMAL LOW (ref 8.9–10.3)
Creatinine, Ser: 1.07 mg/dL (ref 0.61–1.24)
GFR calc Af Amer: >60 mL/min (ref >60)
Glucose, Bld: 126 mg/dL — ABNORMAL HIGH (ref 65–99)
POTASSIUM: 3.3 mmol/L — AB (ref 3.5–5.1)
SODIUM: 136 mmol/L (ref 135–145)

## 2016-09-16 LAB — T4, FREE: FREE T4: 1.01 ng/dL (ref 0.61–1.12)

## 2016-09-16 LAB — TSH: TSH: 3.795 u[IU]/mL (ref 0.350–4.500)

## 2016-09-16 MED ORDER — OXYCODONE HCL 10 MG PO TABS: 10.0000 mg | ORAL_TABLET | Freq: Two times a day (BID) | ORAL | 0 refills | Status: DC | PRN

## 2016-09-16 NOTE — Progress Notes (Signed)
Location:   [facility] Room Number: 131/P Place of Service:  SNF [phone]) Provider:  Amado Coe, MD  Patient Care Team: Redmond School, MD as PCP - General (Internal Medicine) Daneil Dolin, MD (Gastroenterology) Arnoldo Lenis, MD as Consulting Physician (Cardiology)  Extended Emergency Contact Information Primary Emergency Contact: Bolle,Vincent Address: [address]          [place], [place] [address] Johnnette Litter of [place] Phone: [phone] Mobile Phone: [phone] Relation: Son Secondary Emergency Contact: Peggs of [place] Phone: [phone] Relation: Grandaughter  Code Status: DNR  Goals of care: Advanced Directive information Advanced Directives 09/16/2016  Does Patient Have a Medical Advance Directive? Yes  Type of Advance Directive Out of facility DNR (pink MOST or yellow form)  Does patient want to make changes to medical advance directive? No - Patient declined  Copy of [facility] in Chart? -  Would patient like information on creating a medical advance directive? No - Patient declined  Pre-existing out of facility DNR order (yellow form or pink MOST form) -    Chief complaint-acute visit follow-up leukocytosis hypokalemia   HPI:  Pt is a 76 y.o. male seen today for an acute visit for follow-up of leukocytosis as well as hypokalemia.  Patient is a new admission to facility status post hospitalization for acute subacute CVA he is on aspirin 325 mg a day as well as a statin and followed by neurology.  Also had syncope thought to be multifactorial possibly high potent tension with dehydration-this appears to have improved he is not really complaining of syncope.  He was also noted the hospital have leukocytosis which was trending down on lab done yesterday white count was just about 14,000 we did do a repeat today and it is actually 17,000-he continues to really denies any  fever or chills in fact he had complain of intermittent diaphoresis since the CVA but says this has improved over the last day as well as.  He is not complaining of any diarrhea any increased cough or congestion or dysuria urinalysis appears to be negative-chest x-ray appears to be negative as well.  He has been afebrile.  Regards hypokalemia this was supplemented with 40 mEq twice a day of potassium he continues on 40 mEq a day potassium is up to -3.3today he is on torsemide 20 mg a day with history of diastolic CHF.  Not complaining again of any chest congestion cough increased shortness of breath or chest pain or palpitations     Past Medical History:  Diagnosis Date  . Bilateral lower leg cellulitis   . BPH (benign prostatic hyperplasia)   . Cataract    left  . CHF (congestive heart failure) ([place])   . Chronic diastolic HF (heart failure) ([place])   . Collagen vascular disease ([place])    history of venous insufficency and LE cellulitis  . Complication of anesthesia    2012 due to sleep apnea pt has had difficulty being put under  and waking up from anesthesia  . Deep venous insufficiency   . GERD (gastroesophageal reflux disease)   . Glaucoma    left  . Headache   . Heel spur    right heel  . History of bladder infections   . History of colonic polyps   . Hyperlipidemia   . Hypertension    Sees Dr. Debara Pickett  . Hypothyroidism    hypothyroid denies no rx  .  Iron deficiency 07/07/2016  . Kidney stones    passed them  . Obesity   . Osteoarthritis   . PONV (postoperative nausea and vomiting)   . PUD (peptic ulcer disease)   . Ringing in ears   . S/P colonoscopy 2007, Feb and March 2012   hx of adenomas, left-sided diverticula, On 07/2010 TCS, fresh blood and clot noted coming from TI.  . S/P endoscopy 2007, 2012   2007: nl, May 2012: antral erosions  . SIRS (systemic inflammatory response syndrome) ([place]) 02/27/2012  . Sleep apnea    uses cpap  occ "unable to use lately due to  nasal pain when use" - sleep study 2008 AHI 48.80/hr and 56.70hr during REM  . Thrombocytopenia ([place]) 12/30/2011   Stable   Past Surgical History:  Procedure Laterality Date  . BACK SURGERY    . CARDIAC CATHETERIZATION  02/27/2007   no sign of CAD, LVH, mod pulm HTN (Dr. Jackie Plum(  . CATARACT EXTRACTION W/ INTRAOCULAR LENS IMPLANT Right ~ 2012  . COLONOSCOPY N/A 05/28/2014   OIB:[id number] colonic polyps/colonic diverticulosis. Tubular adenomas. Next colonoscopy January 2021  . ESOPHAGOGASTRODUODENOSCOPY  Aug 2013   [facility], single 35mm pedunculated polyp otherwise normal. HYPERPLASTIC, NEGATIVE h.pylori  . ESOPHAGOGASTRODUODENOSCOPY N/A 05/28/2014   RMR:non-critical Schatzki's ring/HH. Benign gastritis  . INGUINAL HERNIA REPAIR Right 1941  . KNEE ARTHROSCOPY Left ~ 2000  . LAPAROSCOPIC CHOLECYSTECTOMY    . [place] SURGERY  2012  . MALONEY DILATION N/A 05/28/2014   Procedure: Venia Minks DILATION;  Surgeon: Daneil Dolin, MD;  [facility];  Service: Endoscopy;  Laterality: N/A;  . NASAL SEPTOPLASTY W/ TURBINOPLASTY Bilateral 10/07/2015   Procedure: NASAL SEPTOPLASTY WITH TURBINATE REDUCTION;  Surgeon: Leta Baptist, MD;  Location: [facility];  Service: ENT;  Laterality: Bilateral;  . NASAL SEPTUM SURGERY Bilateral 10/07/2015    inferior turbinate resection  . NM MYOCAR PERF WALL MOTION  12/2006   dipyridamole myoview - stress images show mild perfusion defect in mid inferior walls with reversibility at rest; mild perfusion defect in mid inferolateral wall at stress with mild defect reversibility, EF 62%, abnormal but low risk study   . POSTERIOR LUMBAR FUSION  2015  . SAVORY DILATION N/A 05/28/2014   Procedure: SAVORY DILATION;  Surgeon: Daneil Dolin, MD;  Location: [facility];  Service: Endoscopy;  Laterality: N/A;  . SHOULDER OPEN ROTATOR CUFF REPAIR Left early 2000s  . small bowel capsule study  07/2011   ?transient focal ischemia in distal small bowel to explain GI bleeding  . TRANSTHORACIC  ECHOCARDIOGRAM  09/2008   EF 60-65%, mod conc LVH  . TRANSURETHRAL RESECTION OF PROSTATE    . TRANSURETHRAL RESECTION OF PROSTATE N/A 06/18/2013   Procedure: TRANSURETHRAL RESECTION OF THE PROSTATE (TURP);  Surgeon: Marissa Nestle, MD;  Location: [facility];  Service: Urology;  Laterality: N/A;    Allergies  Allergen Reactions  . Aspirin Other (See Comments)    Nausea and upset stomach    . Nexium [Esomeprazole Magnesium] Other (See Comments)    Patient said it messed his stomach up    Outpatient Encounter Prescriptions as of 09/16/2016  Medication Sig  . acetaminophen (TYLENOL) 325 MG tablet Take 650 mg by mouth every 6 (six) hours as needed.  Marland Kitchen aspirin 325 MG tablet Take 1 tablet (325 mg total) by mouth daily.  Marland Kitchen atorvastatin (LIPITOR) 40 MG tablet Take 1 tablet (40 mg total) by mouth daily at 6 PM.  . bisacodyl (DULCOLAX)  5 MG EC tablet Take 1 tablet (5 mg total) by mouth daily.  . brimonidine (ALPHAGAN) 0.2 % ophthalmic solution Place 1 drop into the right eye 2 times daily.  . calcium citrate (CALCITRATE - DOSED IN MG ELEMENTAL CALCIUM) 950 MG tablet Take 200 mg of elemental calcium by mouth daily. Takes 3 times per week  . Cholecalciferol (VITAMIN D3) 1000 units CAPS Take 1 capsule by mouth. Takes 3 times per week  . docusate sodium (COLACE) 100 MG capsule Take 200 mg by mouth daily as needed for mild constipation. Reported on 09/11/2015  . dorzolamide-timolol (COSOPT) 22.3-6.8 MG/ML ophthalmic solution Place 1 drop into both eyes 2 (two) times daily.  . ferrous sulfate 325 (65 FE) MG tablet Take 325 mg by mouth daily with breakfast.  . latanoprost (XALATAN) 0.005 % ophthalmic solution Place 1 drop into both eyes at bedtime.  . magnesium oxide (MAG-OX) 400 MG tablet Take 400 mg by mouth daily. Takes 2 times per week  . metolazone (ZAROXOLYN) 2.5 MG tablet Take 1 tablet (2.5 mg total) by mouth every Wednesday.  . metoprolol tartrate (LOPRESSOR) 25 MG tablet Take 1 tablet (25 mg total)  by mouth daily.  . Oxycodone HCl 10 MG TABS Take 1 tablet (10 mg total) by mouth 2 (two) times daily as needed (pain).  . pantoprazole (PROTONIX) 40 MG tablet Take 40 mg by mouth 2 (two) times daily.  Vladimir Faster Glycol-Propyl Glycol (LUBRICANT EYE DROPS) 0.4-0.3 % SOLN Apply 1 drop to eye daily as needed (for lubricant eye drops).  . potassium chloride SA (K-DUR,KLOR-CON) 20 MEQ tablet Take 40 mEq by mouth at bedtime.  Marland Kitchen tiZANidine (ZANAFLEX) 4 MG tablet Take 4 mg by mouth 2 (two) times daily.   Marland Kitchen torsemide (DEMADEX) 20 MG tablet Take 20 daily for 3 days and then reassess Volume Status and increase to 20 BID after re-evaluation.   No facility-administered encounter medications on file as of 09/16/2016.     Review of Systems   In general he complains of no fever or chills says that intermitted episodes of diaphoresis  had since the CVA are improving. And less frequent  Skin does not complain of rashes or itching occasional diaphoresis as noted above with diaphoresis has improved.  Head ears eyes nose mouth and throat does not complaining of any difficulty swallowing visual changes or sore throat.  Respiratory is not complaining of shortness breath or cough.  Cardiac denies chest pain does have some mild lower extremity edema bilaterally.  GI is not complaining of nausea vomiting diarrhea or constipation or abdominal discomfort.  GU denies dysuria.  Musculoskeletal has weakness but does not complain of joint pain at this time.  Neurologic says he's had intermittent dizziness at times since CVA-- does not complain of headache at this time says numbness left side is intermittent  Psych is not complaining of any depression or anxiety  Immunization History  Administered Date(s) Administered  . Influenza Split 02/28/2012, 03/21/2014  . Influenza-Unspecified 12/22/2014  . Tdap 03/06/2014   Pertinent  Health Maintenance Due  Topic Date Due  . INFLUENZA VACCINE  04/11/2017  (Originally 12/21/2016)  . PNA vac Low Risk Adult (1 of 2 - PCV13) 04/22/2017 (Originally 11/17/2004)   Fall Risk  07/05/2016 02/05/2016 08/19/2014  Falls in the past year? No Yes Yes  Number falls in past yr: - 1 1  Injury with Fall? - No -  Risk for fall due to : Other (Comment) Impaired balance/gait;Impaired mobility Impaired mobility  Follow up - Falls evaluation completed;Education provided;Falls prevention discussed -   Functional Status Survey:   Temperature is 97.4 pulse 83 respirations 20 blood pressure 126/81 weight is 271.4   Physical Exam Gen. this is a pleasant elderly male in no distress lying comfortably in bed.  His skin is warm and dry he is not diaphoretic currently.  Eyes pupils appear reactive light sclera and conjunctiva are clear visual acuity appears grossly intact.  Oropharynx is clear mucous membranes moist tongue is midline.  Chest is clear to auscultation there is no labored breathing.  Heart is regular rate and rhythm without murmur gallop or rub he has mild lower extremity edema bilaterally with venous stasis changes.  His abdomen is obese soft nontender with positive bowel sounds.  GU could not appreciate any suprapubic distention tenderness erythema or rash in the genital area scrotum appears to be normal without increased erythema tenderness  Muscle skeletal moves all extremities 4 gross strength appears to be intact bilaterally does have some lower extremity weakness although this could be due to deconditioning upper extremity strength appears preserved.  Neurologic is grossly intact to speech is clear no lateralizing findings appears to possibly have some touch  sensation deficits of the left lower  foot which he says he's had since the CVA  Psych he is alert and oriented pleasant and appropriate.  Labs reviewed:  Recent Labs  03/24/16 0620  03/26/16 0504  09/13/16 0452 09/14/16 0625 09/15/16 0405 09/16/16 0410  NA 137  --  135  <  > 140 140 135 136  K 3.6  < > 3.4*  < > 3.8 3.6 3.0* 3.3*  CL 94*  --  89*  < > 99* 101 98* 98*  CO2 37*  --  36*  < > 32 34* 28 28  GLUCOSE 105*  --  149*  < > 152* 136* 149* 126*  BUN 22*  --  20  < > 25* 19 17 22*  CREATININE 1.13  --  1.35*  < > 1.36* 1.11 0.99 1.07  CALCIUM 9.0  --  9.2  < > 8.9 8.9 8.9 8.8*  MG 2.1  --  2.0  --  2.3  --   --   --   < > = values in this interval not displayed.  Recent Labs  03/29/16 1337 09/12/16 1029 09/15/16 0405  AST 23 21 17   ALT 16* 18 13*  ALKPHOS 66 67 58  BILITOT 0.8 1.3* 0.6  PROT 6.7 7.0 5.6*  ALBUMIN 3.6 3.7 2.9*    Recent Labs  08/09/16 0949 09/12/16 1029  09/14/16 0625 09/15/16 0405 09/16/16 0410  WBC 20.6* 15.6*  < > 12.1* 14.4* 17.0*  NEUTROABS 17.3* 13.3*  --   --   --  14.2*  HGB 16.2 16.6  < > 14.0 14.4 14.2  HCT 48.0 48.4  < > 42.5 44.0 42.9  MCV 73.7* 76.1*  < > 78.0 77.7* 77.6*  PLT 164 141*  < > 102* 111* 117*  < > = values in this interval not displayed. Lab Results  Component Value Date   TSH 3.795 09/16/2016   Lab Results  Component Value Date   HGBA1C 6.4 (H) 09/13/2016   Lab Results  Component Value Date   CHOL 179 09/14/2016   HDL 35 (L) 09/14/2016   LDLCALC 111 (H) 09/14/2016   TRIG 164 (H) 09/14/2016   CHOLHDL 5.1 09/14/2016    Significant Diagnostic Results in last 30 days:  Dg Chest 2 View  Result Date: 09/12/2016 CLINICAL DATA:  Dizziness, weakness.  Syncope. EXAM: CHEST  2 VIEW COMPARISON:  03/22/2016 FINDINGS: Low lung volumes. No confluent opacities or effusions. Heart is normal size. No acute bony abnormality. IMPRESSION: Low lung volumes.  No acute findings. Electronically Signed   By: Rolm Baptise M.D.   On: 09/12/2016 11:51   Ct Head Wo Contrast  Result Date: 09/12/2016 CLINICAL DATA:  Syncope, dizziness EXAM: CT HEAD WITHOUT CONTRAST TECHNIQUE: Contiguous axial images were obtained from the base of the skull through the vertex without intravenous contrast. COMPARISON:   03/22/2016 FINDINGS: Brain: No evidence of acute infarction, hemorrhage, hydrocephalus, extra-axial collection or mass lesion/mass effect. Mild age related atrophy. Vascular: No hyperdense vessel or unexpected calcification. Skull: Normal. Negative for fracture or focal lesion. Sinuses/Orbits: The visualized paranasal sinuses are essentially clear. The mastoid air cells are unopacified. Other: None. IMPRESSION: Normal head CT. Electronically Signed   By: Julian Hy M.D.   On: 09/12/2016 12:11   Mri Brain Without Contrast  Result Date: 09/12/2016 CLINICAL DATA:  76 y/o M; left-sided headache, left-sided chest pain, and numbness in third through fifth toes on the left foot. EXAM: MRI HEAD WITHOUT CONTRAST TECHNIQUE: Multiplanar, multiecho pulse sequences of the brain and surrounding structures were obtained without intravenous contrast. COMPARISON:  09/12/2016 CT of the head. FINDINGS: Brain: Punctate focus of low diffusion within the right posterior limb of internal capsule (series 5, image 50) probably represents a tiny acute infarction. Few foci of T2 FLAIR hyperintense signal abnormality in periventricular white matter are compatible with minimal chronic microvascular ischemic changes. No significant brain parenchymal volume loss. No abnormal susceptibility hypointensity to indicate intracranial hemorrhage. No hydrocephalus or extra-axial collection. No effacement of basilar cisterns. No focal mass effect. Vascular: Normal flow voids. Skull and upper cervical spine: Normal marrow signal. Sinuses/Orbits: Partial opacification of right posterior ethmoid air cells. No abnormal signal of the mastoid air cells. Bilateral intra-ocular lens replacement. Other: None. IMPRESSION: 1. Punctate focus of low diffusion within right posterior limb of internal capsule on coronal diffusion weighted sequence probably represents a tiny acute infarction. No associated mass effect or hemorrhage. 2. Minimal chronic  microvascular ischemic changes of the brain. 3. Mild ethmoid sinus disease. These results will be called to the ordering clinician or representative by the Radiologist Assistant, and communication documented in the PACS or zVision Dashboard. Electronically Signed   By: Kristine Garbe M.D.   On: 09/12/2016 18:53   US Carotid Bilateral (at [facility] Only)  Result Date: 09/14/2016 CLINICAL DATA:  Stroke 3 days ago. Syncopal episode. History of hypertension, diabetes and smoking. EXAM: BILATERAL CAROTID DUPLEX ULTRASOUND TECHNIQUE: Pearline Cables scale imaging, color Doppler and duplex ultrasound were performed of bilateral carotid and vertebral arteries in the neck. COMPARISON:  Carotid Doppler ultrasound - 06/02/2014 FINDINGS: Criteria: Quantification of carotid stenosis is based on velocity parameters that correlate the residual internal carotid diameter with NASCET-based stenosis levels, using the diameter of the distal internal carotid lumen as the denominator for stenosis measurement. The following velocity measurements were obtained: RIGHT ICA:  76/15 cm/sec CCA:  496/75 cm/sec SYSTOLIC ICA/CCA RATIO:  1.0 DIASTOLIC ICA/CCA RATIO:  0.5 ECA:  135 cm/sec LEFT ICA:  112/14 cm/sec CCA:  916/3 cm/sec SYSTOLIC ICA/CCA RATIO:  0.9 DIASTOLIC ICA/CCA RATIO:  0.8 ECA:  114 cm/sec RIGHT CAROTID ARTERY: There is a minimal amount of extended mixed echogenic plaque involving the origin of the right internal carotid artery (image 25), not resulting  in elevated peak systolic velocities within the interrogated course the right internal carotid artery to suggest a hemodynamically significant stenosis. RIGHT VERTEBRAL ARTERY:  Antegrade flow LEFT CAROTID ARTERY: There is no grayscale evidence of significant intimal thickening affecting the interrogated portions of the left carotid system. There are no elevated peak systolic velocity within the interrogated course the left internal carotid artery to suggest a hemodynamically  significant stenosis. LEFT VERTEBRAL ARTERY:  Antegrade Flow IMPRESSION: 1. Minimal amount of right-sided atherosclerotic plaque, not resulting in a hemodynamically significant stenosis. 2. Unremarkable sonographic evaluation of the left carotid system. Electronically Signed   By: Sandi Mariscal M.D.   On: 09/14/2016 08:58    Assessment/Plan #1  hypokalemia this appears to be improving at this point will give potassium 40 M eq a day and recheck this on Monday, April 30.-Potassium is 3.3 up from 3.0 yesterday.  #2 leukocytosis-possibly reactive he does not show signs of sepsis here without any fever or chills chest x-ray and urinalysis at this point appear to be benign.  He has been afebrile.  Continue to monitor vital signs every 4 hours with recommendation again to draw blood cultures if temperature is greater than 100.5.  Will update a CBC as well first laboratory day next week This plan was discussed with Dr. Lyndel Safe via phone  #3 history of diastolic CHF-again recommendation of possibly increased torsemide to 40 mg a day if warranted-at this point will monitor weights if there is weight gain will increase the torsemide will have to keep eye on potassium level as well as.  [id number]    [id number].    Oralia Manis, [place]

## 2016-09-16 NOTE — Telephone Encounter (Signed)
[facility] #[phone] Fax: [phone]   

## 2016-09-17 ENCOUNTER — Encounter (HOSPITAL_COMMUNITY)
Admission: RE | Admit: 2016-09-17 | Discharge: 2016-09-17 | Disposition: A | Discharge status: Home / Self Care | Payer: PPO | Source: Ambulatory Visit | Attending: Oncology | Admitting: Oncology

## 2016-09-17 DIAGNOSIS — D696 Thrombocytopenia, unspecified: Secondary | ICD-10-CM | POA: Insufficient documentation

## 2016-09-17 DIAGNOSIS — D72825 Bandemia: Secondary | ICD-10-CM | POA: Insufficient documentation

## 2016-09-17 LAB — CBC
HCT: 44.6 % (ref 39.0–52.0)
Hemoglobin: 14.8 g/dL (ref 13.0–17.0)
MCH: 25.6 pg — AB (ref 26.0–34.0)
MCHC: 33.2 g/dL (ref 30.0–36.0)
MCV: 77.3 fL — AB (ref 78.0–100.0)
PLATELETS: 122 10*3/uL — AB (ref 150–400)
RBC: 5.77 MIL/uL (ref 4.22–5.81)
RDW: 19.6 % — AB (ref 11.5–15.5)
WBC: 16 10*3/uL — AB (ref 4.0–10.5)

## 2016-09-17 LAB — COMPREHENSIVE METABOLIC PANEL
ALT: 17 U/L (ref 17–63)
AST: 21 U/L (ref 15–41)
Albumin: 3.2 g/dL — ABNORMAL LOW (ref 3.5–5.0)
Alkaline Phosphatase: 66 U/L (ref 38–126)
Anion gap: 8 (ref 5–15)
BUN: 16 mg/dL (ref 6–20)
CHLORIDE: 99 mmol/L — AB (ref 101–111)
CO2: 30 mmol/L (ref 22–32)
Calcium: 9.4 mg/dL (ref 8.9–10.3)
Creatinine, Ser: 1.02 mg/dL (ref 0.61–1.24)
Glucose, Bld: 192 mg/dL — ABNORMAL HIGH (ref 65–99)
POTASSIUM: 3.6 mmol/L (ref 3.5–5.1)
SODIUM: 137 mmol/L (ref 135–145)
Total Bilirubin: 0.8 mg/dL (ref 0.3–1.2)
Total Protein: 6.2 g/dL — ABNORMAL LOW (ref 6.5–8.1)

## 2016-09-17 LAB — URINE CULTURE

## 2016-09-19 ENCOUNTER — Non-Acute Institutional Stay (SKILLED_NURSING_FACILITY): Payer: PPO | Admitting: Internal Medicine

## 2016-09-19 ENCOUNTER — Encounter (HOSPITAL_COMMUNITY)
Admission: RE | Admit: 2016-09-19 | Discharge: 2016-09-19 | Disposition: A | Discharge status: Home / Self Care | Payer: PPO | Source: Skilled Nursing Facility | Attending: *Deleted | Admitting: *Deleted

## 2016-09-19 ENCOUNTER — Encounter: Payer: Self-pay | Admitting: Internal Medicine

## 2016-09-19 DIAGNOSIS — D72829 Elevated white blood cell count, unspecified: Secondary | ICD-10-CM

## 2016-09-19 DIAGNOSIS — D696 Thrombocytopenia, unspecified: Secondary | ICD-10-CM | POA: Diagnosis not present

## 2016-09-19 DIAGNOSIS — E782 Mixed hyperlipidemia: Secondary | ICD-10-CM | POA: Diagnosis not present

## 2016-09-19 DIAGNOSIS — E039 Hypothyroidism, unspecified: Secondary | ICD-10-CM | POA: Diagnosis not present

## 2016-09-19 DIAGNOSIS — I639 Cerebral infarction, unspecified: Secondary | ICD-10-CM

## 2016-09-19 DIAGNOSIS — R600 Localized edema: Secondary | ICD-10-CM

## 2016-09-19 DIAGNOSIS — I5033 Acute on chronic diastolic (congestive) heart failure: Secondary | ICD-10-CM

## 2016-09-19 DIAGNOSIS — I10 Essential (primary) hypertension: Secondary | ICD-10-CM | POA: Diagnosis not present

## 2016-09-19 LAB — BASIC METABOLIC PANEL
Anion gap: 7 (ref 5–15)
BUN: 19 mg/dL (ref 6–20)
CHLORIDE: 98 mmol/L — AB (ref 101–111)
CO2: 33 mmol/L — AB (ref 22–32)
CREATININE: 1.05 mg/dL (ref 0.61–1.24)
Calcium: 9.1 mg/dL (ref 8.9–10.3)
GFR calc non Af Amer: >60 mL/min (ref >60)
GLUCOSE: 122 mg/dL — AB (ref 65–99)
Potassium: 3.7 mmol/L (ref 3.5–5.1)
Sodium: 138 mmol/L (ref 135–145)

## 2016-09-19 LAB — CBC WITH DIFFERENTIAL/PLATELET
HEMATOCRIT: 43.8 % (ref 39.0–52.0)
Hemoglobin: 14.7 g/dL (ref 13.0–17.0)
MCH: 26.3 pg (ref 26.0–34.0)
MCHC: 33.6 g/dL (ref 30.0–36.0)
MCV: 78.2 fL (ref 78.0–100.0)
Platelets: 121 10*3/uL — ABNORMAL LOW (ref 150–400)
RBC: 5.6 MIL/uL (ref 4.22–5.81)
RDW: 19.6 % — AB (ref 11.5–15.5)
WBC: 13.1 10*3/uL — ABNORMAL HIGH (ref 4.0–10.5)

## 2016-09-19 NOTE — Progress Notes (Signed)
Provider:  Veleta Miners Location:   [facility] Room Number: 131/P Place of Service:  SNF (31)  PCP: [name], MD Patient Care Team: Redmond School, MD as PCP - General (Internal Medicine) Daneil Dolin, MD (Gastroenterology) Arnoldo Lenis, MD as Consulting Physician (Cardiology)  Extended Emergency Contact Information Primary Emergency Contact: Lussier,Vincent Address: [address]          [place], [place] [address] Johnnette Litter of [place] Phone: [phone] Mobile Phone: [phone] Relation: Son Secondary Emergency Contact: Oglesby of [place] Phone: [phone] Relation: Grandaughter  Code Status: DNR Goals of Care: Advanced Directive information Advanced Directives 09/19/2016  Does Patient Have a Medical Advance Directive? Yes  Type of Advance Directive Out of facility DNR (pink MOST or yellow form)  Does patient want to make changes to medical advance directive? No - Patient declined  Copy of [place] in Chart? -  Would patient like information on creating a medical advance directive? No - Patient declined  Pre-existing out of facility DNR order (yellow form or pink MOST form) -      Chief Complaint  Patient presents with  . New Admit To SNF    HPI: Patient is a 76 y.o. male seen today for admission to SNF for therapy after Acute Stroke.  Patient has h/o Chronic Diastolic CHF, Obesity, GERD, Chronic Upper Abdominal discomfort, Leucocytosis and Thrombocytopenia Followed by Hematologist.and OSA non compliant with CPAP.  He says that he passed out twice at home and his son brought him to the ED. In the Hospital he was found to have Sub acute /Acute CVA.involving Tiny Infarct of right  internal capsule.He was also thought to be Dehydrated and his diuretics were held. He was started on Aspirin and Lipitor.Carotids US was Negative for any Stenosis and Echo was negative.  Patient is discharged  to facility for further therapy with Plan for him to go home. Patient is c/o Eye problems and says he wants to see his ophthalmologist. He has been seesing him for Glaucoma and his cataract. He wants to get stronger and eventually go home. He lives with his friend who is leaving soon. He does have son and daughter who can help him with his home. Patient denies any Chest pain, SOB, Cough or PND.     Past Medical History:  Diagnosis Date  . Bilateral lower leg cellulitis   . BPH (benign prostatic hyperplasia)   . Cataract    left  . CHF (congestive heart failure) ([place])   . Chronic diastolic HF (heart failure) ([place])   . Collagen vascular disease ([place])    history of venous insufficency and LE cellulitis  . Complication of anesthesia    2012 due to sleep apnea pt has had difficulty being put under  and waking up from anesthesia  . Deep venous insufficiency   . GERD (gastroesophageal reflux disease)   . Glaucoma    left  . Headache   . Heel spur    right heel  . History of bladder infections   . History of colonic polyps   . Hyperlipidemia   . Hypertension    Sees Dr. Debara Pickett  . Hypothyroidism    hypothyroid denies no rx  . Iron deficiency 07/07/2016  . Kidney stones    passed them  . Obesity   . Osteoarthritis   . PONV (postoperative nausea and vomiting)   . PUD (peptic ulcer disease)   . Ringing in  ears   . S/P colonoscopy 2007, Feb and March 2012   hx of adenomas, left-sided diverticula, On 07/2010 TCS, fresh blood and clot noted coming from TI.  . S/P endoscopy 2007, 2012   2007: nl, May 2012: antral erosions  . SIRS (systemic inflammatory response syndrome) ([place]) 02/27/2012  . Sleep apnea    uses cpap  occ "unable to use lately due to nasal pain when use" - sleep study 2008 AHI 48.80/hr and 56.70hr during REM  . Thrombocytopenia ([place]) 12/30/2011   Stable   Past Surgical History:  Procedure Laterality Date  . BACK SURGERY    . CARDIAC CATHETERIZATION  02/27/2007   no  sign of CAD, LVH, mod pulm HTN (Dr. Jackie Plum(  . CATARACT EXTRACTION W/ INTRAOCULAR LENS IMPLANT Right ~ 2012  . COLONOSCOPY N/A 05/28/2014   ONG:EXBMWUXL colonic polyps/colonic diverticulosis. Tubular adenomas. Next colonoscopy January 2021  . ESOPHAGOGASTRODUODENOSCOPY  Aug 2013   [facility], single 78mm pedunculated polyp otherwise normal. HYPERPLASTIC, NEGATIVE h.pylori  . ESOPHAGOGASTRODUODENOSCOPY N/A 05/28/2014   RMR:non-critical Schatzki's ring/HH. Benign gastritis  . INGUINAL HERNIA REPAIR Right 1941  . KNEE ARTHROSCOPY Left ~ 2000  . LAPAROSCOPIC CHOLECYSTECTOMY    . [place] SURGERY  2012  . MALONEY DILATION N/A 05/28/2014   Procedure: Venia Minks DILATION;  Surgeon: Daneil Dolin, MD;  Location[facility];  Service: Endoscopy;  Laterality: N/A;  . NASAL SEPTOPLASTY W/ TURBINOPLASTY Bilateral 10/07/2015   Procedure: NASAL SEPTOPLASTY WITH TURBINATE REDUCTION;  Surgeon: Leta Baptist, MD;  Location: [facility];  Service: ENT;  Laterality: Bilateral;  . NASAL SEPTUM SURGERY Bilateral 10/07/2015    inferior turbinate resection  . NM MYOCAR PERF WALL MOTION  12/2006   dipyridamole myoview - stress images show mild perfusion defect in mid inferior walls with reversibility at rest; mild perfusion defect in mid inferolateral wall at stress with mild defect reversibility, EF 62%, abnormal but low risk study   . POSTERIOR LUMBAR FUSION  2015  . SAVORY DILATION N/A 05/28/2014   Procedure: SAVORY DILATION;  Surgeon: Daneil Dolin, MD;  Location: [facility];  Service: Endoscopy;  Laterality: N/A;  . SHOULDER OPEN ROTATOR CUFF REPAIR Left early 2000s  . small bowel capsule study  07/2011   ?transient focal ischemia in distal small bowel to explain GI bleeding  . TRANSTHORACIC ECHOCARDIOGRAM  09/2008   EF 60-65%, mod conc LVH  . TRANSURETHRAL RESECTION OF PROSTATE    . TRANSURETHRAL RESECTION OF PROSTATE N/A 06/18/2013   Procedure: TRANSURETHRAL RESECTION OF THE PROSTATE (TURP);  Surgeon: Marissa Nestle, MD;   Location: [facility];  Service: Urology;  Laterality: N/A;    reports that he quit smoking about 43 years ago. His smoking use included Cigars. He started smoking about 56 years ago. He quit after 8.00 years of use. He has never used smokeless tobacco. He reports that he does not drink alcohol or use drugs. Social History   Social History  . Marital status: Widowed    Spouse name: Roger Kill  . Number of children: 5  . Years of education: College   Occupational History  . disability Retired   Social History Main Topics  . Smoking status: Former Smoker    Years: 8.00    Types: Cigars    Start date: 11/20/1959    Quit date: 08/10/1973  . Smokeless tobacco: Never Used  . Alcohol use No     Comment: denies  . Drug use: No  . Sexual activity: No  Other Topics Concern  . Not on file   Social History Narrative   Patient lives at home with spouse and son.   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        Functional Status Survey:    Family History  Problem Relation Age of Onset  . Colon cancer Father 81    deceased  . Prostate cancer Father   . Pancreatic cancer Mother 31    deceased  . Prostate cancer Brother   . Arthritis Son   . Arthritis Son   . Diabetes Mellitus II Son     Health Maintenance  Topic Date Due  . INFLUENZA VACCINE  04/11/2017 (Originally 12/21/2016)  . PNA vac Low Risk Adult (1 of 2 - PCV13) 04/22/2017 (Originally 11/17/2004)  . TETANUS/TDAP  03/06/2024    Allergies  Allergen Reactions  . Aspirin Other (See Comments)    Nausea and upset stomach    . Nexium [Esomeprazole Magnesium] Other (See Comments)    Patient said it messed his stomach up    Outpatient Encounter Prescriptions as of 09/19/2016  Medication Sig  . acetaminophen (TYLENOL) 325 MG tablet Take 650 mg by mouth every 6 (six) hours as needed.  Marland Kitchen aspirin 325 MG tablet Take 1 tablet (325 mg total) by mouth daily.  Marland Kitchen atorvastatin (LIPITOR) 40 MG tablet Take 1  tablet (40 mg total) by mouth daily at 6 PM.  . bisacodyl (DULCOLAX) 5 MG EC tablet Take 1 tablet (5 mg total) by mouth daily.  . brimonidine (ALPHAGAN) 0.2 % ophthalmic solution Place 1 drop into the right eye 2 times daily.  . calcium citrate (CALCITRATE - DOSED IN MG ELEMENTAL CALCIUM) 950 MG tablet Take 200 mg of elemental calcium by mouth daily. Takes 3 times per week  . Cholecalciferol (VITAMIN D3) 1000 units CAPS Take 1 capsule by mouth. Takes 3 times per week  . docusate sodium (COLACE) 100 MG capsule Take 200 mg by mouth daily as needed for mild constipation. Reported on 09/11/2015  . dorzolamide-timolol (COSOPT) 22.3-6.8 MG/ML ophthalmic solution Place 1 drop into both eyes 2 (two) times daily.  . ferrous sulfate 325 (65 FE) MG tablet Take 325 mg by mouth daily with breakfast.  . latanoprost (XALATAN) 0.005 % ophthalmic solution Place 1 drop into both eyes at bedtime.  . magnesium oxide (MAG-OX) 400 MG tablet Take 400 mg by mouth daily. Takes 2 times per week  . metolazone (ZAROXOLYN) 2.5 MG tablet Take 1 tablet (2.5 mg total) by mouth every Wednesday.  . metoprolol tartrate (LOPRESSOR) 25 MG tablet Take 1 tablet (25 mg total) by mouth daily.  . Oxycodone HCl 10 MG TABS Take 1 tablet (10 mg total) by mouth 2 (two) times daily as needed (pain).  . pantoprazole (PROTONIX) 40 MG tablet Take 40 mg by mouth 2 (two) times daily.  Vladimir Faster Glycol-Propyl Glycol (LUBRICANT EYE DROPS) 0.4-0.3 % SOLN Apply 1 drop to eye daily as needed (for lubricant eye drops).  . potassium chloride SA (K-DUR,KLOR-CON) 20 MEQ tablet Take 40 mEq by mouth at bedtime. Also  take 40 mg QD  . tiZANidine (ZANAFLEX) 4 MG tablet Take 4 mg by mouth 2 (two) times daily.   Marland Kitchen torsemide (DEMADEX) 20 MG tablet Take 40 mg by mouth 2 (two) times daily.  . [DISCONTINUED] torsemide (DEMADEX) 20 MG tablet Take 20 daily for 3 days and then reassess Volume Status and increase to 20 BID after re-evaluation.  No  facility-administered encounter medications on file as of 09/19/2016.      Review of Systems  Constitutional: Positive for activity change. Negative for appetite change, chills, diaphoresis, fatigue and fever.  HENT: Negative.   Respiratory: Negative.   Cardiovascular: Positive for leg swelling.  Gastrointestinal: Positive for abdominal distention, abdominal pain and nausea. Negative for constipation and diarrhea.  Genitourinary: Negative.   Musculoskeletal: Negative.   Skin: Positive for color change.  Neurological: Negative.   Psychiatric/Behavioral: Negative.     Vitals:   09/19/16 1008  BP: (!) 157/88  Pulse: 85  Resp: 20  Temp: (!) 96.6 F (35.9 C)  TempSrc: Oral   There is no height or weight on file to calculate BMI. Physical Exam  Constitutional: He is oriented to person, place, and time. He appears well-developed and well-nourished.  HENT:  Head: Normocephalic.  Mouth/Throat: Oropharynx is clear and moist.  Eyes: Pupils are equal, round, and reactive to light.  Neck: Neck supple.  Cardiovascular: Normal rate, regular rhythm and normal heart sounds.   No murmur heard. Pulmonary/Chest: Breath sounds normal.  Few rales in Right Lower lung.  Abdominal: Soft. Bowel sounds are normal. He exhibits no distension. There is no tenderness. There is no rebound.  Musculoskeletal: He exhibits edema.  Patient has Moderate edema in both LE Has chronic venous changes in Both LE.  Lymphadenopathy:    He has no cervical adenopathy.  Neurological: He is alert and oriented to person, place, and time.  Has good Strength Bilateral in Upper extremity.  4/5 in Left LE 5/5 in right LE Some sensation loss in Left LE   Skin: Skin is warm and dry. No rash noted. No erythema. No pallor.  Psychiatric: He has a normal mood and affect. His behavior is normal. Thought content normal.    Labs reviewed: Basic Metabolic Panel:  Recent Labs  03/24/16 0620  03/26/16 0504  09/13/16 0452   09/15/16 0405 09/16/16 0410 09/17/16 1213  NA 137  --  135  < > 140  < > 135 136 137  K 3.6  < > 3.4*  < > 3.8  < > 3.0* 3.3* 3.6  CL 94*  --  89*  < > 99*  < > 98* 98* 99*  CO2 37*  --  36*  < > 32  < > 28 28 30   GLUCOSE 105*  --  149*  < > 152*  < > 149* 126* 192*  BUN 22*  --  20  < > 25*  < > 17 22* 16  CREATININE 1.13  --  1.35*  < > 1.36*  < > 0.99 1.07 1.02  CALCIUM 9.0  --  9.2  < > 8.9  < > 8.9 8.8* 9.4  MG 2.1  --  2.0  --  2.3  --   --   --   --   < > = values in this interval not displayed. Liver Function Tests:  Recent Labs  09/12/16 1029 09/15/16 0405 09/17/16 1213  AST 21 17 21   ALT 18 13* 17  ALKPHOS 67 58 66  BILITOT 1.3* 0.6 0.8  PROT 7.0 5.6* 6.2*  ALBUMIN 3.7 2.9* 3.2*    Recent Labs  03/22/16 0832  LIPASE 14   No results for input(s): AMMONIA in the last 8760 hours. CBC:  Recent Labs  08/09/16 0949 09/12/16 1029  09/15/16 0405 09/16/16 0410 09/17/16 1213  WBC 20.6* 15.6*  < > 14.4* 17.0* 16.0*  NEUTROABS  17.3* 13.3*  --   --  14.2*  --   HGB 16.2 16.6  < > 14.4 14.2 14.8  HCT 48.0 48.4  < > 44.0 42.9 44.6  MCV 73.7* 76.1*  < > 77.7* 77.6* 77.3*  PLT 164 141*  < > 111* 117* 122*  < > = values in this interval not displayed. Cardiac Enzymes:  Recent Labs  09/12/16 1757 09/12/16 2255 09/13/16 0452  TROPONINI <0.03 <0.03 <0.03   BNP: Invalid input(s): POCBNP Lab Results  Component Value Date   HGBA1C 6.4 (H) 09/13/2016   Lab Results  Component Value Date   TSH 3.795 09/16/2016   Lab Results  Component Value Date   VITAMINB12 589 09/13/2016   Lab Results  Component Value Date   FOLATE 27.1 03/29/2016   Lab Results  Component Value Date   IRON 30 (L) 03/29/2016   TIBC 323 03/29/2016   FERRITIN 35 08/09/2016    Imaging and Procedures obtained prior to SNF admission: No results found.  Assessment/Plan Acute CVA (cerebrovascular accident)  Patient on Aspirin Getting Therapy.  He is doing well and plans to go  home.   Acute on chronic diastolic CHF Patient has gained some weight in facility but it is near his baseline weight of 275 lbs. He is now back on his diuretics. So will continue to follow.   Essential hypertension BP Slightly high today.  He was taken off Norvasc in hospital.  Will follow . For now. No Change.  Chronic Venous stasis in Both LE At this time does not seem like Cellulitis D/W Nurse will keep monitor.  Hyperlipidemia LDl is high. Not sure if patient was compliant with his Meds at home. Asked patient and he doesn't know. Goal to keep LDL less then 100.  Thrombocytopenia  He is on Aspirin now. Will have him follow Hematologist as outpatient.  Leukocytosis Has history of No signs of infection.  Vision Problems Follow with Opthalmologist.  Chronic Upper abdominal pain Follows with GI. Continue Dexilant.    Family/ staff Communication:   Labs/tests ordered: CBC and BMP in 1 week.  Total time spent in this patient care encounter was 45_ minutes; greater than 50% of the visit spent counseling patient and coordinating care for problems addressed at this encounter.

## 2016-09-20 ENCOUNTER — Ambulatory Visit: Payer: PPO | Admitting: Gastroenterology

## 2016-09-20 DIAGNOSIS — K219 Gastro-esophageal reflux disease without esophagitis: Secondary | ICD-10-CM | POA: Diagnosis not present

## 2016-09-20 DIAGNOSIS — K277 Chronic peptic ulcer, site unspecified, without hemorrhage or perforation: Secondary | ICD-10-CM | POA: Diagnosis not present

## 2016-09-20 DIAGNOSIS — M1990 Unspecified osteoarthritis, unspecified site: Secondary | ICD-10-CM | POA: Diagnosis not present

## 2016-09-20 DIAGNOSIS — N400 Enlarged prostate without lower urinary tract symptoms: Secondary | ICD-10-CM | POA: Diagnosis not present

## 2016-09-20 DIAGNOSIS — R202 Paresthesia of skin: Secondary | ICD-10-CM | POA: Diagnosis not present

## 2016-09-20 DIAGNOSIS — M359 Systemic involvement of connective tissue, unspecified: Secondary | ICD-10-CM | POA: Diagnosis not present

## 2016-09-20 DIAGNOSIS — G4733 Obstructive sleep apnea (adult) (pediatric): Secondary | ICD-10-CM | POA: Diagnosis not present

## 2016-09-20 DIAGNOSIS — M6281 Muscle weakness (generalized): Secondary | ICD-10-CM | POA: Diagnosis not present

## 2016-09-20 DIAGNOSIS — D508 Other iron deficiency anemias: Secondary | ICD-10-CM | POA: Diagnosis not present

## 2016-09-20 DIAGNOSIS — I10 Essential (primary) hypertension: Secondary | ICD-10-CM | POA: Diagnosis not present

## 2016-09-20 DIAGNOSIS — K5900 Constipation, unspecified: Secondary | ICD-10-CM | POA: Diagnosis not present

## 2016-09-20 DIAGNOSIS — E785 Hyperlipidemia, unspecified: Secondary | ICD-10-CM | POA: Diagnosis not present

## 2016-09-20 DIAGNOSIS — R51 Headache: Secondary | ICD-10-CM | POA: Diagnosis not present

## 2016-09-20 DIAGNOSIS — R279 Unspecified lack of coordination: Secondary | ICD-10-CM | POA: Diagnosis not present

## 2016-09-20 DIAGNOSIS — I5032 Chronic diastolic (congestive) heart failure: Secondary | ICD-10-CM | POA: Diagnosis not present

## 2016-09-20 DIAGNOSIS — I639 Cerebral infarction, unspecified: Secondary | ICD-10-CM | POA: Diagnosis not present

## 2016-09-20 DIAGNOSIS — R55 Syncope and collapse: Secondary | ICD-10-CM | POA: Diagnosis not present

## 2016-09-20 DIAGNOSIS — H9313 Tinnitus, bilateral: Secondary | ICD-10-CM | POA: Diagnosis not present

## 2016-09-20 DIAGNOSIS — M7731 Calcaneal spur, right foot: Secondary | ICD-10-CM | POA: Diagnosis not present

## 2016-09-20 DIAGNOSIS — E039 Hypothyroidism, unspecified: Secondary | ICD-10-CM | POA: Diagnosis not present

## 2016-09-26 ENCOUNTER — Encounter (HOSPITAL_COMMUNITY)
Admission: RE | Admit: 2016-09-26 | Discharge: 2016-09-26 | Disposition: A | Discharge status: Home / Self Care | Payer: PPO | Source: Skilled Nursing Facility | Attending: Internal Medicine | Admitting: Internal Medicine

## 2016-09-26 DIAGNOSIS — K5900 Constipation, unspecified: Secondary | ICD-10-CM | POA: Insufficient documentation

## 2016-09-26 DIAGNOSIS — K219 Gastro-esophageal reflux disease without esophagitis: Secondary | ICD-10-CM | POA: Insufficient documentation

## 2016-09-26 DIAGNOSIS — I639 Cerebral infarction, unspecified: Secondary | ICD-10-CM | POA: Insufficient documentation

## 2016-09-26 DIAGNOSIS — K277 Chronic peptic ulcer, site unspecified, without hemorrhage or perforation: Secondary | ICD-10-CM | POA: Insufficient documentation

## 2016-09-26 DIAGNOSIS — I5032 Chronic diastolic (congestive) heart failure: Secondary | ICD-10-CM | POA: Insufficient documentation

## 2016-09-26 LAB — BASIC METABOLIC PANEL
Anion gap: 9 (ref 5–15)
BUN: 17 mg/dL (ref 6–20)
CALCIUM: 9.4 mg/dL (ref 8.9–10.3)
CHLORIDE: 95 mmol/L — AB (ref 101–111)
CO2: 33 mmol/L — ABNORMAL HIGH (ref 22–32)
Creatinine, Ser: 1.08 mg/dL (ref 0.61–1.24)
GFR calc Af Amer: >60 mL/min (ref >60)
GFR calc non Af Amer: >60 mL/min (ref >60)
GLUCOSE: 129 mg/dL — AB (ref 65–99)
Potassium: 3.4 mmol/L — ABNORMAL LOW (ref 3.5–5.1)
SODIUM: 137 mmol/L (ref 135–145)

## 2016-09-26 LAB — CBC WITH DIFFERENTIAL/PLATELET
Basophils Absolute: 0 10*3/uL (ref 0.0–0.1)
Basophils Relative: 0 %
EOS ABS: 0.1 10*3/uL (ref 0.0–0.7)
EOS PCT: 0 %
HCT: 41.3 % (ref 39.0–52.0)
Hemoglobin: 13.8 g/dL (ref 13.0–17.0)
LYMPHS ABS: 1.1 10*3/uL (ref 0.7–4.0)
LYMPHS PCT: 8 %
MCH: 26.2 pg (ref 26.0–34.0)
MCHC: 33.4 g/dL (ref 30.0–36.0)
MCV: 78.4 fL (ref 78.0–100.0)
Monocytes Absolute: 1.1 10*3/uL — ABNORMAL HIGH (ref 0.1–1.0)
Monocytes Relative: 7 %
Neutro Abs: 12 10*3/uL — ABNORMAL HIGH (ref 1.7–7.7)
Neutrophils Relative %: 84 %
PLATELETS: 106 10*3/uL — AB (ref 150–400)
RBC: 5.27 MIL/uL (ref 4.22–5.81)
RDW: 18.8 % — ABNORMAL HIGH (ref 11.5–15.5)
WBC: 14.3 10*3/uL — ABNORMAL HIGH (ref 4.0–10.5)

## 2016-09-30 ENCOUNTER — Non-Acute Institutional Stay (SKILLED_NURSING_FACILITY): Payer: PPO | Admitting: Internal Medicine

## 2016-09-30 ENCOUNTER — Encounter (HOSPITAL_COMMUNITY)
Admission: RE | Admit: 2016-09-30 | Discharge: 2016-09-30 | Disposition: A | Discharge status: Home / Self Care | Payer: PPO | Source: Skilled Nursing Facility | Attending: Internal Medicine | Admitting: Internal Medicine

## 2016-09-30 ENCOUNTER — Encounter: Payer: Self-pay | Admitting: Internal Medicine

## 2016-09-30 DIAGNOSIS — E876 Hypokalemia: Secondary | ICD-10-CM

## 2016-09-30 DIAGNOSIS — K219 Gastro-esophageal reflux disease without esophagitis: Secondary | ICD-10-CM | POA: Insufficient documentation

## 2016-09-30 DIAGNOSIS — D72829 Elevated white blood cell count, unspecified: Secondary | ICD-10-CM | POA: Diagnosis not present

## 2016-09-30 DIAGNOSIS — I5033 Acute on chronic diastolic (congestive) heart failure: Secondary | ICD-10-CM

## 2016-09-30 DIAGNOSIS — K5900 Constipation, unspecified: Secondary | ICD-10-CM | POA: Insufficient documentation

## 2016-09-30 DIAGNOSIS — K277 Chronic peptic ulcer, site unspecified, without hemorrhage or perforation: Secondary | ICD-10-CM | POA: Insufficient documentation

## 2016-09-30 DIAGNOSIS — I639 Cerebral infarction, unspecified: Secondary | ICD-10-CM | POA: Insufficient documentation

## 2016-09-30 DIAGNOSIS — I10 Essential (primary) hypertension: Secondary | ICD-10-CM | POA: Insufficient documentation

## 2016-09-30 LAB — BASIC METABOLIC PANEL
ANION GAP: 9 (ref 5–15)
BUN: 21 mg/dL — ABNORMAL HIGH (ref 6–20)
CALCIUM: 9.3 mg/dL (ref 8.9–10.3)
CHLORIDE: 91 mmol/L — AB (ref 101–111)
CO2: 37 mmol/L — AB (ref 22–32)
Creatinine, Ser: 1.19 mg/dL (ref 0.61–1.24)
GFR calc non Af Amer: 58 mL/min — ABNORMAL LOW (ref >60)
Glucose, Bld: 150 mg/dL — ABNORMAL HIGH (ref 65–99)
POTASSIUM: 2.8 mmol/L — AB (ref 3.5–5.1)
Sodium: 137 mmol/L (ref 135–145)

## 2016-09-30 NOTE — Progress Notes (Signed)
Location:   [facility] Number: 131/P Place of Service:  SNF (31) Provider:  Charleston Poot, MD  Patient Care Team: Redmond School, MD as PCP - General (Internal Medicine) Gala Romney Cristopher Estimable, MD (Gastroenterology) Harl Bowie Alphonse Guild, MD as Consulting Physician (Cardiology)  Extended Emergency Contact Information Primary Emergency Contact: Boettger,Vincent Address: [address]          [place], [place] [address] Johnnette Litter of [place] Phone: [phone] Mobile Phone: [phone] Relation: Son Secondary Emergency Contact: Stanford of [place] Phone: [phone] Relation: Grandaughter  Code Status:  Full Code Goals of care: Advanced Directive information Advanced Directives 09/30/2016  Does Patient Have a Medical Advance Directive? Yes  Type of Advance Directive Out of facility DNR (pink MOST or yellow form)  Does patient want to make changes to medical advance directive? No - Patient declined  Copy of [place] in Chart? -  Would patient like information on creating a medical advance directive? No - Patient declined  Pre-existing out of facility DNR order (yellow form or pink MOST form) -     Chief Complaint  Patient presents with  . Acute Visit    Low Potassium    HPI:  Pt is a 76 y.o. male seen today for an acute visit for Potassium of 2.8.  Patient has h/o Chronic Diastolic CHF, Obesity, GERD, Chronic Upper Abdominal discomfort, Leucocytosis and Thrombocytopenia Followed by Hematologist.and OSA non compliant with CPAP.  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  He also had dehydration on hospital admission in diuretics were held.  Since then he has improved significantly-his diuretics have been restarted including torsemide which has been increased recently up to 40 mg a day's also metolazone 2.5 mg once a  week.  He is also on potassium supplementation initially had low potassium this was supplemented and normalized however 4 days goes potassium of 3.4 potassium was mildly increased however it is now 2.8 today. He is currently on 50 mEq of potassium a day  He is not complaining of chest pain or palpitations edema actually appears improved as weight appears to be trending down.  He is currently sitting up in his bed eating dinner appears to be feeling well he has no complaints of shortness breath chest pain palpitations or syncope   Past Medical History:  Diagnosis Date  . Bilateral lower leg cellulitis   . BPH (benign prostatic hyperplasia)   . Cataract    left  . CHF (congestive heart failure) ([place])   . Chronic diastolic HF (heart failure) ([place])   . Collagen vascular disease ([place])    history of venous insufficency and LE cellulitis  . Complication of anesthesia    2012 due to sleep apnea pt has had difficulty being put under  and waking up from anesthesia  . Deep venous insufficiency   . GERD (gastroesophageal reflux disease)   . Glaucoma    left  . Headache   . Heel spur    right heel  . History of bladder infections   . History of colonic polyps   . Hyperlipidemia   . Hypertension    Sees Dr. Debara Pickett  . Hypothyroidism    hypothyroid denies no rx  . Iron deficiency 07/07/2016  . Kidney stones    passed them  . Obesity   . Osteoarthritis   . PONV (postoperative nausea  and vomiting)   . PUD (peptic ulcer disease)   . Ringing in ears   . S/P colonoscopy 2007, Feb and March 2012   hx of adenomas, left-sided diverticula, On 07/2010 TCS, fresh blood and clot noted coming from TI.  . S/P endoscopy 2007, 2012   2007: nl, May 2012: antral erosions  . SIRS (systemic inflammatory response syndrome) ([place]) 02/27/2012  . Sleep apnea    uses cpap  occ "unable to use lately due to nasal pain when use" - sleep study 2008 AHI 48.80/hr and 56.70hr during REM  . Thrombocytopenia ([place])  12/30/2011   Stable   Past Surgical History:  Procedure Laterality Date  . BACK SURGERY    . CARDIAC CATHETERIZATION  02/27/2007   no sign of CAD, LVH, mod pulm HTN (Dr. Jackie Plum(  . CATARACT EXTRACTION W/ INTRAOCULAR LENS IMPLANT Right ~ 2012  . COLONOSCOPY N/A 05/28/2014   IDP:OEUMPNTI colonic polyps/colonic diverticulosis. Tubular adenomas. Next colonoscopy January 2021  . ESOPHAGOGASTRODUODENOSCOPY  Aug 2013   [facility], single 15mm pedunculated polyp otherwise normal. HYPERPLASTIC, NEGATIVE h.pylori  . ESOPHAGOGASTRODUODENOSCOPY N/A 05/28/2014   RMR:non-critical Schatzki's ring/HH. Benign gastritis  . INGUINAL HERNIA REPAIR Right 1941  . KNEE ARTHROSCOPY Left ~ 2000  . LAPAROSCOPIC CHOLECYSTECTOMY    . [place] SURGERY  2012  . MALONEY DILATION N/A 05/28/2014   Procedure: Venia Minks DILATION;  Surgeon: Daneil Dolin, MD;  Location: [facility];  Service: Endoscopy;  Laterality: N/A;  . NASAL SEPTOPLASTY W/ TURBINOPLASTY Bilateral 10/07/2015   Procedure: NASAL SEPTOPLASTY WITH TURBINATE REDUCTION;  Surgeon: Leta Baptist, MD;  Location: [facility];  Service: ENT;  Laterality: Bilateral;  . NASAL SEPTUM SURGERY Bilateral 10/07/2015    inferior turbinate resection  . NM MYOCAR PERF WALL MOTION  12/2006   dipyridamole myoview - stress images show mild perfusion defect in mid inferior walls with reversibility at rest; mild perfusion defect in mid inferolateral wall at stress with mild defect reversibility, EF 62%, abnormal but low risk study   . POSTERIOR LUMBAR FUSION  2015  . SAVORY DILATION N/A 05/28/2014   Procedure: SAVORY DILATION;  Surgeon: Daneil Dolin, MD;  Location: [facility];  Service: Endoscopy;  Laterality: N/A;  . SHOULDER OPEN ROTATOR CUFF REPAIR Left early 2000s  . small bowel capsule study  07/2011   ?transient focal ischemia in distal small bowel to explain GI bleeding  . TRANSTHORACIC ECHOCARDIOGRAM  09/2008   EF 60-65%, mod conc LVH  . TRANSURETHRAL RESECTION OF PROSTATE    .  TRANSURETHRAL RESECTION OF PROSTATE N/A 06/18/2013   Procedure: TRANSURETHRAL RESECTION OF THE PROSTATE (TURP);  Surgeon: Marissa Nestle, MD;  Location: [facility];  Service: Urology;  Laterality: N/A;    Allergies  Allergen Reactions  . Aspirin Other (See Comments)    Nausea and upset stomach    . Nexium [Esomeprazole Magnesium] Other (See Comments)    Patient said it messed his stomach up    Outpatient Encounter Prescriptions as of 09/30/2016  Medication Sig  . acetaminophen (TYLENOL) 325 MG tablet Take 650 mg by mouth every 6 (six) hours as needed.  Marland Kitchen aspirin 325 MG tablet Take 1 tablet (325 mg total) by mouth daily.  Marland Kitchen atorvastatin (LIPITOR) 40 MG tablet Take 1 tablet (40 mg total) by mouth daily at 6 PM.  . bisacodyl (DULCOLAX) 5 MG EC tablet Take 1 tablet (5 mg total) by mouth daily.  . brimonidine (ALPHAGAN) 0.2 % ophthalmic solution Place 1 drop into  the right eye 2 times daily.  . calcium citrate (CALCITRATE - DOSED IN MG ELEMENTAL CALCIUM) 950 MG tablet Take 200 mg of elemental calcium by mouth daily. Takes 3 times per week  . Cholecalciferol (VITAMIN D3) 1000 units CAPS Take 1 capsule by mouth. Takes 3 times per week  . docusate sodium (COLACE) 100 MG capsule Take 200 mg by mouth daily as needed for mild constipation. Reported on 09/11/2015  . dorzolamide-timolol (COSOPT) 22.3-6.8 MG/ML ophthalmic solution Place 1 drop into both eyes 2 (two) times daily.  . ferrous sulfate 325 (65 FE) MG tablet Take 325 mg by mouth daily with breakfast.  . latanoprost (XALATAN) 0.005 % ophthalmic solution Place 1 drop into both eyes at bedtime.  . magnesium oxide (MAG-OX) 400 MG tablet Take 400 mg by mouth daily. Takes 2 times per week  . Menthol, Topical Analgesic, (BIOFREEZE) 4 % GEL To be used by therapy dept as needed  . metolazone (ZAROXOLYN) 2.5 MG tablet Take 1 tablet (2.5 mg total) by mouth every Wednesday.  . metoprolol tartrate (LOPRESSOR) 25 MG tablet Take 1 tablet (25 mg total) by  mouth daily.  . Oxycodone HCl 10 MG TABS Take 1 tablet (10 mg total) by mouth 2 (two) times daily as needed (pain).  Vladimir Faster Glycol-Propyl Glycol (LUBRICANT EYE DROPS) 0.4-0.3 % SOLN Apply 1 drop to eye daily as needed (for lubricant eye drops).  . potassium chloride (K-DUR,KLOR-CON) 10 MEQ tablet Take 5 mEq by mouth daily.  Marland Kitchen tiZANidine (ZANAFLEX) 4 MG tablet Take 4 mg by mouth 2 (two) times daily.   Marland Kitchen torsemide (DEMADEX) 20 MG tablet Take 40 mg by mouth 2 (two) times daily.  . [DISCONTINUED] pantoprazole (PROTONIX) 40 MG tablet Take 40 mg by mouth 2 (two) times daily.  . [DISCONTINUED] potassium chloride SA (K-DUR,KLOR-CON) 20 MEQ tablet Take 40 mEq by mouth at bedtime. Also  take 40 mg QD   No facility-administered encounter medications on file as of 09/30/2016.     Review of Systems  In general is not complaining of any fever or chills appears she's lost a small amount of weight.  Skin does not complain of rashes itching or diaphoresis.  Head ears eyes nose mouth and throat does not complaining of diaphoresis or visual changes.  Respiratory denies increased shortness of breath or cough.  Cardiac is denying chest pain or palpitations edema appears to be improved somewhat.  GI is not complaining of any abdominal pain nausea vomiting diarrhea or constipation.  Muscle skeletal does not complain of joint pain does have some lower extremity weakness.  Neurologic is not complaining of dizziness headache or syncope.  In psych appears to be in good spirits does not complaining of any depression or anxiety.     Immunization History  Administered Date(s) Administered  . Influenza Split 02/28/2012, 03/21/2014  . Influenza-Unspecified 12/22/2014  . Tdap 03/06/2014   Pertinent  Health Maintenance Due  Topic Date Due  . INFLUENZA VACCINE  04/11/2017 (Originally 12/21/2016)  . PNA vac Low Risk Adult (1 of 2 - PCV13) 04/22/2017 (Originally 11/17/2004)   Fall Risk  07/05/2016 02/05/2016  08/19/2014  Falls in the past year? No Yes Yes  Number falls in past yr: - 1 1  Injury with Fall? - No -  Risk for fall due to : Other (Comment) Impaired balance/gait;Impaired mobility Impaired mobility  Follow up - Falls evaluation completed;Education provided;Falls prevention discussed -   Functional Status Survey:      Physical Exam  He is afebrile pulse of 72 respirations of 17 blood pressure is pending  In general this is a pleasant somewhat obese elderly male in no distress sitting comfortably on the side of his bed.  His skin is warm and dry.  Oropharynx clear mucous membrane is moist.  Chest is clear to auscultation with somewhat shallow air entry there is no labored breathing.  Heart is regular rate and rhythm without murmur gallop or rub he has moderate lower extremity edema this actually appears somewhat improved from previous exam he does have venous stasis changes as well bilaterally.  Abdomen is obese soft nontender positive bowel sounds.  Muscle skeletal moves all extremities 4 with some mild lower extremity weakness this appears to be baseline.  Neurologic is grossly intact could not really appreciate lateralizing findings --speech is clear  Psych he is alert and oriented pleasant and appropriate  Labs reviewed:  Recent Labs  03/24/16 0620  03/26/16 0504  09/13/16 0452  09/19/16 0800 09/26/16 0700 09/30/16 0700  NA 137  --  135  < > 140  < > 138 137 137  K 3.6  < > 3.4*  < > 3.8  < > 3.7 3.4* 2.8*  CL 94*  --  89*  < > 99*  < > 98* 95* 91*  CO2 37*  --  36*  < > 32  < > 33* 33* 37*  GLUCOSE 105*  --  149*  < > 152*  < > 122* 129* 150*  BUN 22*  --  20  < > 25*  < > 19 17 21*  CREATININE 1.13  --  1.35*  < > 1.36*  < > 1.05 1.08 1.19  CALCIUM 9.0  --  9.2  < > 8.9  < > 9.1 9.4 9.3  MG 2.1  --  2.0  --  2.3  --   --   --   --   < > = values in this interval not displayed.  Recent Labs  09/12/16 1029 09/15/16 0405 09/17/16 1213  AST 21 17 21     ALT 18 13* 17  ALKPHOS 67 58 66  BILITOT 1.3* 0.6 0.8  PROT 7.0 5.6* 6.2*  ALBUMIN 3.7 2.9* 3.2*    Recent Labs  09/12/16 1029  09/16/16 0410 09/17/16 1213 09/19/16 0800 09/26/16 0700  WBC 15.6*  < > 17.0* 16.0* 13.1* 14.3*  NEUTROABS 13.3*  --  14.2*  --   --  12.0*  HGB 16.6  < > 14.2 14.8 14.7 13.8  HCT 48.4  < > 42.9 44.6 43.8 41.3  MCV 76.1*  < > 77.6* 77.3* 78.2 78.4  PLT 141*  < > 117* 122* 121* 106*  < > = values in this interval not displayed. Lab Results  Component Value Date   TSH 3.795 09/16/2016   Lab Results  Component Value Date   HGBA1C 6.4 (H) 09/13/2016   Lab Results  Component Value Date   CHOL 179 09/14/2016   HDL 35 (L) 09/14/2016   LDLCALC 111 (H) 09/14/2016   TRIG 164 (H) 09/14/2016   CHOLHDL 5.1 09/14/2016    Significant Diagnostic Results in last 30 days:  Dg Chest 2 View  Result Date: 09/12/2016 CLINICAL DATA:  Dizziness, weakness.  Syncope. EXAM: CHEST  2 VIEW COMPARISON:  03/22/2016 FINDINGS: Low lung volumes. No confluent opacities or effusions. Heart is normal size. No acute bony abnormality. IMPRESSION: Low lung volumes.  No acute findings. Electronically Signed   By:  Rolm Baptise M.D.   On: 09/12/2016 11:51   Ct Head Wo Contrast  Result Date: 09/12/2016 CLINICAL DATA:  Syncope, dizziness EXAM: CT HEAD WITHOUT CONTRAST TECHNIQUE: Contiguous axial images were obtained from the base of the skull through the vertex without intravenous contrast. COMPARISON:  03/22/2016 FINDINGS: Brain: No evidence of acute infarction, hemorrhage, hydrocephalus, extra-axial collection or mass lesion/mass effect. Mild age related atrophy. Vascular: No hyperdense vessel or unexpected calcification. Skull: Normal. Negative for fracture or focal lesion. Sinuses/Orbits: The visualized paranasal sinuses are essentially clear. The mastoid air cells are unopacified. Other: None. IMPRESSION: Normal head CT. Electronically Signed   By: Julian Hy M.D.   On:  09/12/2016 12:11   Mri Brain Without Contrast  Result Date: 09/12/2016 CLINICAL DATA:  76 y/o M; left-sided headache, left-sided chest pain, and numbness in third through fifth toes on the left foot. EXAM: MRI HEAD WITHOUT CONTRAST TECHNIQUE: Multiplanar, multiecho pulse sequences of the brain and surrounding structures were obtained without intravenous contrast. COMPARISON:  09/12/2016 CT of the head. FINDINGS: Brain: Punctate focus of low diffusion within the right posterior limb of internal capsule (series 5, image 50) probably represents a tiny acute infarction. Few foci of T2 FLAIR hyperintense signal abnormality in periventricular white matter are compatible with minimal chronic microvascular ischemic changes. No significant brain parenchymal volume loss. No abnormal susceptibility hypointensity to indicate intracranial hemorrhage. No hydrocephalus or extra-axial collection. No effacement of basilar cisterns. No focal mass effect. Vascular: Normal flow voids. Skull and upper cervical spine: Normal marrow signal. Sinuses/Orbits: Partial opacification of right posterior ethmoid air cells. No abnormal signal of the mastoid air cells. Bilateral intra-ocular lens replacement. Other: None. IMPRESSION: 1. Punctate focus of low diffusion within right posterior limb of internal capsule on coronal diffusion weighted sequence probably represents a tiny acute infarction. No associated mass effect or hemorrhage. 2. Minimal chronic microvascular ischemic changes of the brain. 3. Mild ethmoid sinus disease. These results will be called to the ordering clinician or representative by the Radiologist Assistant, and communication documented in the PACS or zVision Dashboard. Electronically Signed   By: Kristine Garbe M.D.   On: 09/12/2016 18:53   US Carotid Bilateral (at [facility] Only)  Result Date: 09/14/2016 CLINICAL DATA:  Stroke 3 days ago. Syncopal episode. History of hypertension, diabetes and smoking.  EXAM: BILATERAL CAROTID DUPLEX ULTRASOUND TECHNIQUE: Pearline Cables scale imaging, color Doppler and duplex ultrasound were performed of bilateral carotid and vertebral arteries in the neck. COMPARISON:  Carotid Doppler ultrasound - 06/02/2014 FINDINGS: Criteria: Quantification of carotid stenosis is based on velocity parameters that correlate the residual internal carotid diameter with NASCET-based stenosis levels, using the diameter of the distal internal carotid lumen as the denominator for stenosis measurement. The following velocity measurements were obtained: RIGHT ICA:  76/15 cm/sec CCA:  301/60 cm/sec SYSTOLIC ICA/CCA RATIO:  1.0 DIASTOLIC ICA/CCA RATIO:  0.5 ECA:  135 cm/sec LEFT ICA:  112/14 cm/sec CCA:  109/3 cm/sec SYSTOLIC ICA/CCA RATIO:  0.9 DIASTOLIC ICA/CCA RATIO:  0.8 ECA:  114 cm/sec RIGHT CAROTID ARTERY: There is a minimal amount of extended mixed echogenic plaque involving the origin of the right internal carotid artery (image 25), not resulting in elevated peak systolic velocities within the interrogated course the right internal carotid artery to suggest a hemodynamically significant stenosis. RIGHT VERTEBRAL ARTERY:  Antegrade flow LEFT CAROTID ARTERY: There is no grayscale evidence of significant intimal thickening affecting the interrogated portions of the left carotid system. There are no elevated peak systolic  velocity within the interrogated course the left internal carotid artery to suggest a hemodynamically significant stenosis. LEFT VERTEBRAL ARTERY:  Antegrade Flow IMPRESSION: 1. Minimal amount of right-sided atherosclerotic plaque, not resulting in a hemodynamically significant stenosis. 2. Unremarkable sonographic evaluation of the left carotid system. Electronically Signed   By: Sandi Mariscal M.D.   On: 09/14/2016 08:58    Assessment/Plan #1 hypokalemia I suspect this is due to diuretics-potassium is going lower 2.8 will give an additional 40 mEq of potassium this afternoon as well as this  evening and recheck a metabolic panel tomorrow continue the 50 mEq in the morning again depending on lab tomorrow will make adjustments.  Will update a BMP as well as a magnesium level-I note he is on magnesium twice a week-magnesium level on April 24 was within normal range at 2.3  #2 history of diastolic CHF this actually appears to be stable edema appears to be improved weight appears to be trending down slightly is on torsemide 40 mg daily as well as metolazone 2.5 mg once a week-again we will need to monitor his weights as well as potassium closely.  #3 history of anemia he is on iron supplementation -last hemoglobin was 13.8 on May 7-white count was mildly elevated at 14.3 he does have history of somewhat chronic leukocytosis he does not show any signs of infection  [id number]

## 2016-10-01 ENCOUNTER — Encounter (HOSPITAL_COMMUNITY)
Admission: RE | Admit: 2016-10-01 | Discharge: 2016-10-01 | Disposition: A | Discharge status: Home / Self Care | Payer: PPO

## 2016-10-01 LAB — MAGNESIUM: MAGNESIUM: 2 mg/dL (ref 1.7–2.4)

## 2016-10-01 LAB — BASIC METABOLIC PANEL
Anion gap: 12 (ref 5–15)
BUN: 20 mg/dL (ref 6–20)
CO2: 32 mmol/L (ref 22–32)
CREATININE: 1.2 mg/dL (ref 0.61–1.24)
Calcium: 10.3 mg/dL (ref 8.9–10.3)
Chloride: 93 mmol/L — ABNORMAL LOW (ref 101–111)
GFR calc Af Amer: >60 mL/min (ref >60)
GFR, EST NON AFRICAN AMERICAN: 57 mL/min — AB (ref >60)
GLUCOSE: 160 mg/dL — AB (ref 65–99)
POTASSIUM: 3.1 mmol/L — AB (ref 3.5–5.1)
SODIUM: 137 mmol/L (ref 135–145)

## 2016-10-03 ENCOUNTER — Non-Acute Institutional Stay (SKILLED_NURSING_FACILITY): Payer: PPO | Admitting: Internal Medicine

## 2016-10-03 ENCOUNTER — Encounter (HOSPITAL_COMMUNITY)
Admission: RE | Admit: 2016-10-03 | Discharge: 2016-10-03 | Disposition: A | Discharge status: Home / Self Care | Payer: PPO | Source: Skilled Nursing Facility | Attending: Internal Medicine | Admitting: Internal Medicine

## 2016-10-03 ENCOUNTER — Encounter: Payer: Self-pay | Admitting: Internal Medicine

## 2016-10-03 DIAGNOSIS — I639 Cerebral infarction, unspecified: Secondary | ICD-10-CM | POA: Insufficient documentation

## 2016-10-03 DIAGNOSIS — I5033 Acute on chronic diastolic (congestive) heart failure: Secondary | ICD-10-CM | POA: Diagnosis not present

## 2016-10-03 DIAGNOSIS — K219 Gastro-esophageal reflux disease without esophagitis: Secondary | ICD-10-CM | POA: Insufficient documentation

## 2016-10-03 DIAGNOSIS — E611 Iron deficiency: Secondary | ICD-10-CM

## 2016-10-03 DIAGNOSIS — K277 Chronic peptic ulcer, site unspecified, without hemorrhage or perforation: Secondary | ICD-10-CM | POA: Insufficient documentation

## 2016-10-03 DIAGNOSIS — K5900 Constipation, unspecified: Secondary | ICD-10-CM | POA: Insufficient documentation

## 2016-10-03 DIAGNOSIS — D72829 Elevated white blood cell count, unspecified: Secondary | ICD-10-CM | POA: Diagnosis not present

## 2016-10-03 DIAGNOSIS — E876 Hypokalemia: Secondary | ICD-10-CM

## 2016-10-03 LAB — BASIC METABOLIC PANEL
Anion gap: 7 (ref 5–15)
BUN: 20 mg/dL (ref 6–20)
CHLORIDE: 98 mmol/L — AB (ref 101–111)
CO2: 34 mmol/L — ABNORMAL HIGH (ref 22–32)
Calcium: 9.4 mg/dL (ref 8.9–10.3)
Creatinine, Ser: 1.15 mg/dL (ref 0.61–1.24)
GFR calc Af Amer: >60 mL/min (ref >60)
GFR calc non Af Amer: 60 mL/min — ABNORMAL LOW (ref >60)
GLUCOSE: 127 mg/dL — AB (ref 65–99)
POTASSIUM: 3.7 mmol/L (ref 3.5–5.1)
Sodium: 139 mmol/L (ref 135–145)

## 2016-10-03 NOTE — Progress Notes (Signed)
Location:   [facility] Room Number: 131/P Place of Service:  SNF (31)  Provider: Granville Lewis  PCP: Redmond School, MD Patient Care Team: Redmond School, MD as PCP - General (Internal Medicine) Gala Romney, Cristopher Estimable, MD (Gastroenterology) Harl Bowie Alphonse Guild, MD as Consulting Physician (Cardiology)  Extended Emergency Contact Information Primary Emergency Contact: Biehl,Vincent Address: [address]          [place],  [address] Johnnette [place] of [place] Phone: [phone] Mobile Phone: [phone] Relation: Son Secondary Emergency Contact: [place] of [place] Phone: [phone] Relation: Grandaughter  Code Status: DNR Goals of care:  Advanced Directive information Advanced Directives 10/03/2016  Does Patient Have a Medical Advance Directive? Yes  Type of Advance Directive Out of facility DNR (pink MOST or yellow form)  Does patient want to make changes to medical advance directive? No - Patient declined  Copy of [place] in Chart? -  Would patient like information on creating a medical advance directive? No - Patient declined  Pre-existing out of facility DNR order (yellow form or pink MOST form) -     Allergies  Allergen Reactions  . Aspirin Other (See Comments)    Nausea and upset stomach    . Nexium [Esomeprazole Magnesium] Other (See Comments)    Patient said it messed his stomach up    Chief Complaint  Patient presents with  . Discharge Note    HPI:  76 y.o. male  in today for discharge.   Patient has h/o Chronic Diastolic CHF, Obesity, GERD, Chronic Upper Abdominal discomfort, Leucocytosis and Thrombocytopenia Followed by Hematologist.and OSA non compliant with CPAP.  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  He also had dehydration on hospital admission in diuretics were held.  Since then he has improved  significantly-his diuretics have been restarted including torsemide which has been increased recently up to 40 mg a day's also metolazone 2.5 mg once a week.  He is also on potassium supplementation initially had low potassium this was supplemented and normalized  However at times it has gone low and has been supplemented   Potassium has normalized at 3.7 today it was 3.1--2 days ago-  He has a history of diastolic CHF but he appears stable on the torsemide as well as some metolazone-his weight has been relatively stable.  He also has a history of anemia currently on iron last hemoglobin was 13.8 on lab done on May 7 and this will warm follow-up by primary care provider as well.       he appears to be stable and has no complaints today-he will need continued PT and OT-he will be living with his girlfriend who is very supportive.  He will need a rolling walker to assist with ambulation as well as continued PT and OT as well as nursing support for his multiple medical issues.        Past Medical History:  Diagnosis Date  . Bilateral lower leg cellulitis   . BPH (benign prostatic hyperplasia)   . Cataract    left  . CHF (congestive heart failure) ([place])   . Chronic diastolic HF (heart failure) ([place])   . Collagen vascular disease ([place])    history of venous insufficency and LE cellulitis  . Complication of anesthesia    2012 due to sleep apnea pt has had difficulty being put under  and waking up from anesthesia  . Deep venous  insufficiency   . GERD (gastroesophageal reflux disease)   . Glaucoma    left  . Headache   . Heel spur    right heel  . History of bladder infections   . History of colonic polyps   . Hyperlipidemia   . Hypertension    Sees Dr. Debara Pickett  . Hypothyroidism    hypothyroid denies no rx  . Iron deficiency 07/07/2016  . Kidney stones    passed them  . Obesity   . Osteoarthritis   . PONV (postoperative nausea and vomiting)   . PUD (peptic ulcer  disease)   . Ringing in ears   . S/P colonoscopy 2007, Feb and March 2012   hx of adenomas, left-sided diverticula, On 07/2010 TCS, fresh blood and clot noted coming from TI.  . S/P endoscopy 2007, 2012   2007: nl, May 2012: antral erosions  . SIRS (systemic inflammatory response syndrome) ([place]) 02/27/2012  . Sleep apnea    uses cpap  occ "unable to use lately due to nasal pain when use" - sleep study 2008 AHI 48.80/hr and 56.70hr during REM  . Thrombocytopenia ([place]) 12/30/2011   Stable    Past Surgical History:  Procedure Laterality Date  . BACK SURGERY    . CARDIAC CATHETERIZATION  02/27/2007   no sign of CAD, LVH, mod pulm HTN (Dr. Jackie Plum(  . CATARACT EXTRACTION W/ INTRAOCULAR LENS IMPLANT Right ~ 2012  . COLONOSCOPY N/A 05/28/2014   CHE:NIDPOEUM colonic polyps/colonic diverticulosis. Tubular adenomas. Next colonoscopy January 2021  . ESOPHAGOGASTRODUODENOSCOPY  Aug 2013   [facility], single 74mm pedunculated polyp otherwise normal. HYPERPLASTIC, NEGATIVE h.pylori  . ESOPHAGOGASTRODUODENOSCOPY N/A 05/28/2014   RMR:non-critical Schatzki's ring/HH. Benign gastritis  . INGUINAL HERNIA REPAIR Right 1941  . KNEE ARTHROSCOPY Left ~ 2000  . LAPAROSCOPIC CHOLECYSTECTOMY    . [place] SURGERY  2012  . MALONEY DILATION N/A 05/28/2014   Procedure: Venia Minks DILATION;  Surgeon: Daneil Dolin, MD;  Location[facility];  Service: Endoscopy;  Laterality: N/A;  . NASAL SEPTOPLASTY W/ TURBINOPLASTY Bilateral 10/07/2015   Procedure: NASAL SEPTOPLASTY WITH TURBINATE REDUCTION;  Surgeon: Leta Baptist, MD;  Location: [facility];  Service: ENT;  Laterality: Bilateral;  . NASAL SEPTUM SURGERY Bilateral 10/07/2015    inferior turbinate resection  . NM MYOCAR PERF WALL MOTION  12/2006   dipyridamole myoview - stress images show mild perfusion defect in mid inferior walls with reversibility at rest; mild perfusion defect in mid inferolateral wall at stress with mild defect reversibility, EF 62%, abnormal but low risk study     . POSTERIOR LUMBAR FUSION  2015  . SAVORY DILATION N/A 05/28/2014   Procedure: SAVORY DILATION;  Surgeon: Daneil Dolin, MD;  Location: [facility];  Service: Endoscopy;  Laterality: N/A;  . SHOULDER OPEN ROTATOR CUFF REPAIR Left early 2000s  . small bowel capsule study  07/2011   ?transient focal ischemia in distal small bowel to explain GI bleeding  . TRANSTHORACIC ECHOCARDIOGRAM  09/2008   EF 60-65%, mod conc LVH  . TRANSURETHRAL RESECTION OF PROSTATE    . TRANSURETHRAL RESECTION OF PROSTATE N/A 06/18/2013   Procedure: TRANSURETHRAL RESECTION OF THE PROSTATE (TURP);  Surgeon: Marissa Nestle, MD;  Location: [facility];  Service: Urology;  Laterality: N/A;      reports that he quit smoking about 43 years ago. His smoking use included Cigars. He started smoking about 56 years ago. He quit after 8.00 years of use. He has never used smokeless  tobacco. He reports that he does not drink alcohol or use drugs. Social History   Social History  . Marital status: Widowed    Spouse name: Roger Kill  . Number of children: 5  . Years of education: College   Occupational History  . disability Retired   Social History Main Topics  . Smoking status: Former Smoker    Years: 8.00    Types: Cigars    Start date: 11/20/1959    Quit date: 08/10/1973  . Smokeless tobacco: Never Used  . Alcohol use No     Comment: denies  . Drug use: No  . Sexual activity: No   Other Topics Concern  . Not on file   Social History Narrative   Patient lives at home with spouse and son.   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       Functional Status Survey:    Allergies  Allergen Reactions  . Aspirin Other (See Comments)    Nausea and upset stomach    . Nexium [Esomeprazole Magnesium] Other (See Comments)    Patient said it messed his stomach up    Pertinent  Health Maintenance Due  Topic Date Due  . INFLUENZA VACCINE  04/11/2017 (Originally 12/21/2016)  . PNA vac Low Risk  Adult (1 of 2 - PCV13) 04/22/2017 (Originally 11/17/2004)    Medications: Outpatient Encounter Prescriptions as of 10/03/2016  Medication Sig  . acetaminophen (TYLENOL) 325 MG tablet Take 650 mg by mouth every 6 (six) hours as needed.  Marland Kitchen aspirin 325 MG tablet Take 1 tablet (325 mg total) by mouth daily.  Marland Kitchen atorvastatin (LIPITOR) 40 MG tablet Take 1 tablet (40 mg total) by mouth daily at 6 PM.  . bisacodyl (DULCOLAX) 5 MG EC tablet Take 1 tablet (5 mg total) by mouth daily.  . brimonidine (ALPHAGAN) 0.2 % ophthalmic solution Place 1 drop into the right eye 2 times daily.  . calcium citrate (CALCITRATE - DOSED IN MG ELEMENTAL CALCIUM) 950 MG tablet Take 200 mg of elemental calcium by mouth daily. Takes 3 times per week  . Cholecalciferol (VITAMIN D3) 1000 units CAPS Take 1 capsule by mouth. Takes 3 times per week  . docusate sodium (COLACE) 100 MG capsule Take 200 mg by mouth daily as needed for mild constipation. Reported on 09/11/2015  . dorzolamide-timolol (COSOPT) 22.3-6.8 MG/ML ophthalmic solution Place 1 drop into both eyes 2 (two) times daily.  . ferrous sulfate 325 (65 FE) MG tablet Take 325 mg by mouth daily with breakfast.  . latanoprost (XALATAN) 0.005 % ophthalmic solution Place 1 drop into both eyes at bedtime.  . magnesium oxide (MAG-OX) 400 MG tablet Take 400 mg by mouth daily. Takes 2 times per week  . Menthol, Topical Analgesic, (BIOFREEZE) 4 % GEL To be used by therapy dept as needed  . metolazone (ZAROXOLYN) 2.5 MG tablet Take 1 tablet (2.5 mg total) by mouth every Wednesday.  . metoprolol tartrate (LOPRESSOR) 25 MG tablet Take 1 tablet (25 mg total) by mouth daily.  . Oxycodone HCl 10 MG TABS Take 1 tablet (10 mg total) by mouth 2 (two) times daily as needed (pain).  Vladimir Faster Glycol-Propyl Glycol (LUBRICANT EYE DROPS) 0.4-0.3 % SOLN Place 1 drop into both eyes daily as needed (for lubricant eye drops).   . potassium chloride (K-DUR,KLOR-CON) 10 MEQ tablet Take 40 mEq by mouth  2 (two) times daily.   Marland Kitchen tiZANidine (ZANAFLEX) 4 MG tablet Take 4 mg  by mouth 2 (two) times daily.   Marland Kitchen torsemide (DEMADEX) 20 MG tablet Take 40 mg by mouth daily.    No facility-administered encounter medications on file as of 10/03/2016.      Review of Systems   In general is not complaining of any fever or chills he appears to be relatively stable  Skin does not complain of rashes itching or diaphoresis.  Head ears eyes nose mouth and throat does not complaining of diaphoresis or visual changes.  Respiratory denies increased shortness of breath or cough.  Cardiac is denying chest pain or palpitations edema appears to be improving somewhat.  GI is not complaining of any abdominal pain nausea vomiting diarrhea or constipation.  Muscle skeletal does not complain of joint pain does have some lower extremity weakness.  Neurologic is not complaining o headache or syncope. At times has complained of numbness but apparently this has improved   psych appears to be in good spirits does not complaining of any depression or anxiety.-He is packed and ready to go home   Vitals:   10/03/16 1221  BP: (!) 97/59  Pulse: 76  Resp: 20  Temp: 97.7 F (36.5 C)  TempSrc: Oral  Manual blood pressure on recheck was 132/80 previous blood pressure 123/80.  Weight appears stable at 270.5 pounds  Physical Exam    In general this is a pleasant somewhat obese elderly male in no distress sitting comfortably on the side of his bed.  His skin is warm and dry.  Oropharynx clear mucous membrane is moist.  Chest is clear to auscultation with somewhat shallow air entry there is no labored breathing.  Heart is regular rate and rhythm with somewhat distant heart sounds without murmur gallop or rub he has moderate lower extremity edema this actually appears s improved from previous exam he does have venous stasis changes as well bilaterally.  Abdomen is obese soft nontender positive  bowel sounds.  Muscle skeletal moves all extremities 4 with some mild lower extremity weakness he is ambulatory but still somewhat weak  Neurologic is grossly intact could not really appreciate lateralizing findings --speech is clear  Psych he is alert and oriented pleasant and appropriate  Labs reviewed: Basic Metabolic Panel:  Recent Labs  03/26/16 0504  09/13/16 0452  09/30/16 0700 10/01/16 0750 10/03/16 0700  NA 135  < > 140  < > 137 137 139  K 3.4*  < > 3.8  < > 2.8* 3.1* 3.7  CL 89*  < > 99*  < > 91* 93* 98*  CO2 36*  < > 32  < > 37* 32 34*  GLUCOSE 149*  < > 152*  < > 150* 160* 127*  BUN 20  < > 25*  < > 21* 20 20  CREATININE 1.35*  < > 1.36*  < > 1.19 1.20 1.15  CALCIUM 9.2  < > 8.9  < > 9.3 10.3 9.4  MG 2.0  --  2.3  --   --  2.0  --   < > = values in this interval not displayed. Liver Function Tests:  Recent Labs  09/12/16 1029 09/15/16 0405 09/17/16 1213  AST 21 17 21   ALT 18 13* 17  ALKPHOS 67 58 66  BILITOT 1.3* 0.6 0.8  PROT 7.0 5.6* 6.2*  ALBUMIN 3.7 2.9* 3.2*    Recent Labs  03/22/16 0832  LIPASE 14   No results for input(s): AMMONIA in the last 8760 hours. CBC:  Recent Labs  09/12/16 1029  09/16/16 0410 09/17/16 1213 09/19/16 0800 09/26/16 0700  WBC 15.6*  < > 17.0* 16.0* 13.1* 14.3*  NEUTROABS 13.3*  --  14.2*  --   --  12.0*  HGB 16.6  < > 14.2 14.8 14.7 13.8  HCT 48.4  < > 42.9 44.6 43.8 41.3  MCV 76.1*  < > 77.6* 77.3* 78.2 78.4  PLT 141*  < > 117* 122* 121* 106*  < > = values in this interval not displayed. Cardiac Enzymes:  Recent Labs  09/12/16 1757 09/12/16 2255 09/13/16 0452  TROPONINI <0.03 <0.03 <0.03   BNP: Invalid input(s): POCBNP CBG:  Recent Labs  09/13/16 2202 09/14/16 0740 09/14/16 1209  GLUCAP 198* 132* 144*    Procedures and Imaging Studies During Stay: Dg Chest 2 View  Result Date: 09/12/2016 CLINICAL DATA:  Dizziness, weakness.  Syncope. EXAM: CHEST  2 VIEW COMPARISON:  03/22/2016  FINDINGS: Low lung volumes. No confluent opacities or effusions. Heart is normal size. No acute bony abnormality. IMPRESSION: Low lung volumes.  No acute findings. Electronically Signed   By: Rolm Baptise M.D.   On: 09/12/2016 11:51   Ct Head Wo Contrast  Result Date: 09/12/2016 CLINICAL DATA:  Syncope, dizziness EXAM: CT HEAD WITHOUT CONTRAST TECHNIQUE: Contiguous axial images were obtained from the base of the skull through the vertex without intravenous contrast. COMPARISON:  03/22/2016 FINDINGS: Brain: No evidence of acute infarction, hemorrhage, hydrocephalus, extra-axial collection or mass lesion/mass effect. Mild age related atrophy. Vascular: No hyperdense vessel or unexpected calcification. Skull: Normal. Negative for fracture or focal lesion. Sinuses/Orbits: The visualized paranasal sinuses are essentially clear. The mastoid air cells are unopacified. Other: None. IMPRESSION: Normal head CT. Electronically Signed   By: Julian Hy M.D.   On: 09/12/2016 12:11   Mri Brain Without Contrast  Result Date: 09/12/2016 CLINICAL DATA:  76 y/o M; left-sided headache, left-sided chest pain, and numbness in third through fifth toes on the left foot. EXAM: MRI HEAD WITHOUT CONTRAST TECHNIQUE: Multiplanar, multiecho pulse sequences of the brain and surrounding structures were obtained without intravenous contrast. COMPARISON:  09/12/2016 CT of the head. FINDINGS: Brain: Punctate focus of low diffusion within the right posterior limb of internal capsule (series 5, image 50) probably represents a tiny acute infarction. Few foci of T2 FLAIR hyperintense signal abnormality in periventricular white matter are compatible with minimal chronic microvascular ischemic changes. No significant brain parenchymal volume loss. No abnormal susceptibility hypointensity to indicate intracranial hemorrhage. No hydrocephalus or extra-axial collection. No effacement of basilar cisterns. No focal mass effect. Vascular: Normal  flow voids. Skull and upper cervical spine: Normal marrow signal. Sinuses/Orbits: Partial opacification of right posterior ethmoid air cells. No abnormal signal of the mastoid air cells. Bilateral intra-ocular lens replacement. Other: None. IMPRESSION: 1. Punctate focus of low diffusion within right posterior limb of internal capsule on coronal diffusion weighted sequence probably represents a tiny acute infarction. No associated mass effect or hemorrhage. 2. Minimal chronic microvascular ischemic changes of the brain. 3. Mild ethmoid sinus disease. These results will be called to the ordering clinician or representative by the Radiologist Assistant, and communication documented in the PACS or zVision Dashboard. Electronically Signed   By: Kristine Garbe M.D.   On: 09/12/2016 18:53   US Carotid Bilateral (at [facility] Only)  Result Date: 09/14/2016 CLINICAL DATA:  Stroke 3 days ago. Syncopal episode. History of hypertension, diabetes and smoking. EXAM: BILATERAL CAROTID DUPLEX ULTRASOUND TECHNIQUE: Pearline Cables scale imaging, color Doppler and duplex ultrasound  were performed of bilateral carotid and vertebral arteries in the neck. COMPARISON:  Carotid Doppler ultrasound - 06/02/2014 FINDINGS: Criteria: Quantification of carotid stenosis is based on velocity parameters that correlate the residual internal carotid diameter with NASCET-based stenosis levels, using the diameter of the distal internal carotid lumen as the denominator for stenosis measurement. The following velocity measurements were obtained: RIGHT ICA:  76/15 cm/sec CCA:  103/01 cm/sec SYSTOLIC ICA/CCA RATIO:  1.0 DIASTOLIC ICA/CCA RATIO:  0.5 ECA:  135 cm/sec LEFT ICA:  112/14 cm/sec CCA:  314/3 cm/sec SYSTOLIC ICA/CCA RATIO:  0.9 DIASTOLIC ICA/CCA RATIO:  0.8 ECA:  114 cm/sec RIGHT CAROTID ARTERY: There is a minimal amount of extended mixed echogenic plaque involving the origin of the right internal carotid artery (image 25), not resulting in  elevated peak systolic velocities within the interrogated course the right internal carotid artery to suggest a hemodynamically significant stenosis. RIGHT VERTEBRAL ARTERY:  Antegrade flow LEFT CAROTID ARTERY: There is no grayscale evidence of significant intimal thickening affecting the interrogated portions of the left carotid system. There are no elevated peak systolic velocity within the interrogated course the left internal carotid artery to suggest a hemodynamically significant stenosis. LEFT VERTEBRAL ARTERY:  Antegrade Flow IMPRESSION: 1. Minimal amount of right-sided atherosclerotic plaque, not resulting in a hemodynamically significant stenosis. 2. Unremarkable sonographic evaluation of the left carotid system. Electronically Signed   By: Sandi Mariscal M.D.   On: 09/14/2016 08:58    Assessment/Plan:    #1 history of CVA-he continues on aspirin as well as a statin appears to have minimal deficits will need follow-up as needed and will be followed by primary care provider.  #2 history of diastolic CHF his weight appears stable he is on metolazone once a week continues on torsemide 40 mg every day-she is on aggressive potassium supplementation with a history of hypokalemia.  Regards potassium is normal at 3.7 the day will slightly reduce his dose down to 30 mEq twice a day with expedient follow-up by primary care provider.  .  #3 hypokalemia please see above.  #4 history of hyperlipidemia he continues on a statin LDL recently was 111.--will defer to primary care provider for adjustments  #5 history of vitamin D deficiency is on supplementation.  #6 history of leukocytosis this is chronic he does not really show any sign of infection.  #7 history of thrombocytopenia he will warrant hematology follow-up as an outpatient.  #8 anemia he is on iron hemoglobin appears stable at 13.8.  #9 history of hypertension this appears stable currently on metoprolol 25 mg a day as well as his  diuretics  #10 history of venous stasis changes again he continues on diuretics including torsemide every day and metolazone once a week.  #11 history diabetes apparently diet controlled blood sugars have been somewhat variable during his stay here ranging from 122 up to-280 will warm follow-up by primary care provider  Hemoglobin A1c on 09/13/2016 was 6.4  Again he will need PT and OT for further strengthening as well as a rolling walker to assist with ambulation also will need nursing support for his multiple medical issues.  Regards to pain management he is on oxycodone twice a day when necessary-also receives Zanaflex twice a day.    [id number] note greater than 30 minutes spent on this discharge summary-greater than 50% of time spent coordinating plan of care for numerous diagnoses

## 2016-10-04 DIAGNOSIS — Z87891 Personal history of nicotine dependence: Secondary | ICD-10-CM | POA: Diagnosis not present

## 2016-10-04 DIAGNOSIS — I69354 Hemiplegia and hemiparesis following cerebral infarction affecting left non-dominant side: Secondary | ICD-10-CM | POA: Diagnosis not present

## 2016-10-04 DIAGNOSIS — E785 Hyperlipidemia, unspecified: Secondary | ICD-10-CM | POA: Diagnosis not present

## 2016-10-04 DIAGNOSIS — N400 Enlarged prostate without lower urinary tract symptoms: Secondary | ICD-10-CM | POA: Diagnosis not present

## 2016-10-04 DIAGNOSIS — I110 Hypertensive heart disease with heart failure: Secondary | ICD-10-CM | POA: Diagnosis not present

## 2016-10-04 DIAGNOSIS — D509 Iron deficiency anemia, unspecified: Secondary | ICD-10-CM | POA: Diagnosis not present

## 2016-10-04 DIAGNOSIS — E876 Hypokalemia: Secondary | ICD-10-CM | POA: Diagnosis not present

## 2016-10-04 DIAGNOSIS — Z79891 Long term (current) use of opiate analgesic: Secondary | ICD-10-CM | POA: Diagnosis not present

## 2016-10-04 DIAGNOSIS — E039 Hypothyroidism, unspecified: Secondary | ICD-10-CM | POA: Diagnosis not present

## 2016-10-04 DIAGNOSIS — G4730 Sleep apnea, unspecified: Secondary | ICD-10-CM | POA: Diagnosis not present

## 2016-10-04 DIAGNOSIS — I872 Venous insufficiency (chronic) (peripheral): Secondary | ICD-10-CM | POA: Diagnosis not present

## 2016-10-04 DIAGNOSIS — I5032 Chronic diastolic (congestive) heart failure: Secondary | ICD-10-CM | POA: Diagnosis not present

## 2016-10-04 DIAGNOSIS — E669 Obesity, unspecified: Secondary | ICD-10-CM | POA: Diagnosis not present

## 2016-10-05 DIAGNOSIS — G894 Chronic pain syndrome: Secondary | ICD-10-CM | POA: Diagnosis not present

## 2016-10-05 DIAGNOSIS — E669 Obesity, unspecified: Secondary | ICD-10-CM | POA: Diagnosis not present

## 2016-10-05 DIAGNOSIS — Z6841 Body Mass Index (BMI) 40.0 and over, adult: Secondary | ICD-10-CM | POA: Diagnosis not present

## 2016-10-05 DIAGNOSIS — G8192 Hemiplegia, unspecified affecting left dominant side: Secondary | ICD-10-CM | POA: Diagnosis not present

## 2016-10-05 DIAGNOSIS — E1142 Type 2 diabetes mellitus with diabetic polyneuropathy: Secondary | ICD-10-CM | POA: Diagnosis not present

## 2016-10-05 DIAGNOSIS — E1140 Type 2 diabetes mellitus with diabetic neuropathy, unspecified: Secondary | ICD-10-CM | POA: Diagnosis not present

## 2016-10-05 DIAGNOSIS — E1129 Type 2 diabetes mellitus with other diabetic kidney complication: Secondary | ICD-10-CM | POA: Diagnosis not present

## 2016-10-05 DIAGNOSIS — I639 Cerebral infarction, unspecified: Secondary | ICD-10-CM | POA: Diagnosis not present

## 2016-10-05 DIAGNOSIS — E871 Hypo-osmolality and hyponatremia: Secondary | ICD-10-CM | POA: Diagnosis not present

## 2016-10-05 DIAGNOSIS — Z1389 Encounter for screening for other disorder: Secondary | ICD-10-CM | POA: Diagnosis not present

## 2016-10-10 DIAGNOSIS — M1990 Unspecified osteoarthritis, unspecified site: Secondary | ICD-10-CM | POA: Diagnosis not present

## 2016-10-10 DIAGNOSIS — R0601 Orthopnea: Secondary | ICD-10-CM | POA: Diagnosis not present

## 2016-10-10 DIAGNOSIS — I509 Heart failure, unspecified: Secondary | ICD-10-CM | POA: Diagnosis not present

## 2016-10-10 DIAGNOSIS — E669 Obesity, unspecified: Secondary | ICD-10-CM | POA: Diagnosis not present

## 2016-10-10 DIAGNOSIS — G4733 Obstructive sleep apnea (adult) (pediatric): Secondary | ICD-10-CM | POA: Diagnosis not present

## 2016-10-10 DIAGNOSIS — I639 Cerebral infarction, unspecified: Secondary | ICD-10-CM | POA: Diagnosis not present

## 2016-10-14 ENCOUNTER — Encounter: Payer: Self-pay | Admitting: Cardiology

## 2016-10-14 ENCOUNTER — Ambulatory Visit (INDEPENDENT_AMBULATORY_CARE_PROVIDER_SITE_OTHER): Payer: PPO | Admitting: Cardiology

## 2016-10-14 ENCOUNTER — Encounter: Payer: Self-pay | Admitting: *Deleted

## 2016-10-14 VITALS — BP 128/74 | HR 99 | Ht 66.0 in | Wt 266.0 lb

## 2016-10-14 DIAGNOSIS — I5032 Chronic diastolic (congestive) heart failure: Secondary | ICD-10-CM | POA: Diagnosis not present

## 2016-10-14 DIAGNOSIS — R55 Syncope and collapse: Secondary | ICD-10-CM | POA: Diagnosis not present

## 2016-10-14 DIAGNOSIS — I10 Essential (primary) hypertension: Secondary | ICD-10-CM | POA: Diagnosis not present

## 2016-10-14 DIAGNOSIS — I639 Cerebral infarction, unspecified: Secondary | ICD-10-CM

## 2016-10-14 DIAGNOSIS — E782 Mixed hyperlipidemia: Secondary | ICD-10-CM | POA: Diagnosis not present

## 2016-10-14 MED ORDER — CLOPIDOGREL BISULFATE 75 MG PO TABS: 75.0000 mg | ORAL_TABLET | Freq: Every day | ORAL | 3 refills | Status: DC

## 2016-10-14 NOTE — Progress Notes (Signed)
Clinical Summary Mr. Gearin is a 76 y.o.male seen today for follow up of the following medical problems.   1. Chronic diastolic heart failure   - diuretics decreased after recent admission 08/2016 with syncope, found to be orthostatic at that time - homes weights 265 lbs and stable. No recent SOB - compliant with meds  2. Hyperlipidemia - he continues to be compliant with statin  3.HTN - he reports home bp's  130s/80s   4. Syncope - admitted 08/2016 with syncope.  - orthostatic on admission - no repeat episodes -  5. CVA - noted during recent admit 08/2016 by imaging - startd on ASA 325mg  by neuro  6. OSA - followed by Dr Luan Pulling Past Medical History:  Diagnosis Date  . Bilateral lower leg cellulitis   . BPH (benign prostatic hyperplasia)   . Cataract    left  . CHF (congestive heart failure) ([place])   . Chronic diastolic HF (heart failure) ([place])   . Collagen vascular disease ([place])    history of venous insufficency and LE cellulitis  . Complication of anesthesia    2012 due to sleep apnea pt has had difficulty being put under  and waking up from anesthesia  . Deep venous insufficiency   . GERD (gastroesophageal reflux disease)   . Glaucoma    left  . Headache   . Heel spur    right heel  . History of bladder infections   . History of colonic polyps   . Hyperlipidemia   . Hypertension    Sees Dr. Debara Pickett  . Hypothyroidism    hypothyroid denies no rx  . Iron deficiency 07/07/2016  . Kidney stones    passed them  . Obesity   . Osteoarthritis   . PONV (postoperative nausea and vomiting)   . PUD (peptic ulcer disease)   . Ringing in ears   . S/P colonoscopy 2007, Feb and March 2012   hx of adenomas, left-sided diverticula, On 07/2010 TCS, fresh blood and clot noted coming from TI.  . S/P endoscopy 2007, 2012   2007: nl, May 2012: antral erosions  . SIRS (systemic inflammatory response syndrome) ([place]) 02/27/2012  . Sleep apnea    uses cpap  occ "unable  to use lately due to nasal pain when use" - sleep study 2008 AHI 48.80/hr and 56.70hr during REM  . Thrombocytopenia ([place]) 12/30/2011   Stable     Allergies  Allergen Reactions  . Aspirin Other (See Comments)    Nausea and upset stomach    . Nexium [Esomeprazole Magnesium] Other (See Comments)    Patient said it messed his stomach up     Current Outpatient Prescriptions  Medication Sig Dispense Refill  . acetaminophen (TYLENOL) 325 MG tablet Take 650 mg by mouth every 6 (six) hours as needed.    Marland Kitchen aspirin 325 MG tablet Take 1 tablet (325 mg total) by mouth daily. 30 tablet 0  . atorvastatin (LIPITOR) 40 MG tablet Take 1 tablet (40 mg total) by mouth daily at 6 PM. 30 tablet 0  . bisacodyl (DULCOLAX) 5 MG EC tablet Take 1 tablet (5 mg total) by mouth daily. 30 tablet 0  . brimonidine (ALPHAGAN) 0.2 % ophthalmic solution Place 1 drop into the right eye 2 times daily.    . calcium citrate (CALCITRATE - DOSED IN MG ELEMENTAL CALCIUM) 950 MG tablet Take 200 mg of elemental calcium by mouth daily. Takes 3 times per week    . Cholecalciferol (  VITAMIN D3) 1000 units CAPS Take 1 capsule by mouth. Takes 3 times per week    . docusate sodium (COLACE) 100 MG capsule Take 200 mg by mouth daily as needed for mild constipation. Reported on 09/11/2015    . dorzolamide-timolol (COSOPT) 22.3-6.8 MG/ML ophthalmic solution Place 1 drop into both eyes 2 (two) times daily.    . ferrous sulfate 325 (65 FE) MG tablet Take 325 mg by mouth daily with breakfast.    . latanoprost (XALATAN) 0.005 % ophthalmic solution Place 1 drop into both eyes at bedtime.    . magnesium oxide (MAG-OX) 400 MG tablet Take 400 mg by mouth daily. Takes 2 times per week    . Menthol, Topical Analgesic, (BIOFREEZE) 4 % GEL To be used by therapy dept as needed    . metolazone (ZAROXOLYN) 2.5 MG tablet Take 1 tablet (2.5 mg total) by mouth every Wednesday. 10 tablet 0  . metoprolol tartrate (LOPRESSOR) 25 MG tablet Take 1 tablet (25 mg  total) by mouth daily. 90 tablet 3  . Oxycodone HCl 10 MG TABS Take 1 tablet (10 mg total) by mouth 2 (two) times daily as needed (pain). 60 tablet 0  . Polyethyl Glycol-Propyl Glycol (LUBRICANT EYE DROPS) 0.4-0.3 % SOLN Place 1 drop into both eyes daily as needed (for lubricant eye drops).     . potassium chloride (K-DUR,KLOR-CON) 10 MEQ tablet Take 40 mEq by mouth 2 (two) times daily.     Marland Kitchen tiZANidine (ZANAFLEX) 4 MG tablet Take 4 mg by mouth 2 (two) times daily.     Marland Kitchen torsemide (DEMADEX) 20 MG tablet Take 40 mg by mouth daily.      No current facility-administered medications for this visit.      Past Surgical History:  Procedure Laterality Date  . BACK SURGERY    . CARDIAC CATHETERIZATION  02/27/2007   no sign of CAD, LVH, mod pulm HTN (Dr. Jackie Plum(  . CATARACT EXTRACTION W/ INTRAOCULAR LENS IMPLANT Right ~ 2012  . COLONOSCOPY N/A 05/28/2014   YTK:[id number] colonic polyps/colonic diverticulosis. Tubular adenomas. Next colonoscopy January 2021  . ESOPHAGOGASTRODUODENOSCOPY  Aug 2013   [facility], single 51mm pedunculated polyp otherwise normal. HYPERPLASTIC, NEGATIVE h.pylori  . ESOPHAGOGASTRODUODENOSCOPY N/A 05/28/2014   RMR:non-critical Schatzki's ring/HH. Benign gastritis  . INGUINAL HERNIA REPAIR Right 1941  . KNEE ARTHROSCOPY Left ~ 2000  . LAPAROSCOPIC CHOLECYSTECTOMY    . [place] SURGERY  2012  . MALONEY DILATION N/A 05/28/2014   Procedure: Venia Minks DILATION;  Surgeon: Daneil Dolin, MD;  [facility];  Service: Endoscopy;  Laterality: N/A;  . NASAL SEPTOPLASTY W/ TURBINOPLASTY Bilateral 10/07/2015   Procedure: NASAL SEPTOPLASTY WITH TURBINATE REDUCTION;  Surgeon: Leta Baptist, MD;  Location: [facility];  Service: ENT;  Laterality: Bilateral;  . NASAL SEPTUM SURGERY Bilateral 10/07/2015    inferior turbinate resection  . NM MYOCAR PERF WALL MOTION  12/2006   dipyridamole myoview - stress images show mild perfusion defect in mid inferior walls with reversibility at rest; mild perfusion  defect in mid inferolateral wall at stress with mild defect reversibility, EF 62%, abnormal but low risk study   . POSTERIOR LUMBAR FUSION  2015  . SAVORY DILATION N/A 05/28/2014   Procedure: SAVORY DILATION;  Surgeon: Daneil Dolin, MD;  Location: [facility];  Service: Endoscopy;  Laterality: N/A;  . SHOULDER OPEN ROTATOR CUFF REPAIR Left early 2000s  . small bowel capsule study  07/2011   ?transient focal ischemia in distal small  bowel to explain GI bleeding  . TRANSTHORACIC ECHOCARDIOGRAM  09/2008   EF 60-65%, mod conc LVH  . TRANSURETHRAL RESECTION OF PROSTATE    . TRANSURETHRAL RESECTION OF PROSTATE N/A 06/18/2013   Procedure: TRANSURETHRAL RESECTION OF THE PROSTATE (TURP);  Surgeon: Marissa Nestle, MD;  Location: [facility];  Service: Urology;  Laterality: N/A;     Allergies  Allergen Reactions  . Aspirin Other (See Comments)    Nausea and upset stomach    . Nexium [Esomeprazole Magnesium] Other (See Comments)    Patient said it messed his stomach up      Family History  Problem Relation Age of Onset  . Colon cancer Father 52       deceased  . Prostate cancer Father   . Pancreatic cancer Mother 100       deceased  . Prostate cancer Brother   . Arthritis Son   . Arthritis Son   . Diabetes Mellitus II Son      Social History Mr. Dittus reports that he quit smoking about 43 years ago. His smoking use included Cigars. He started smoking about 56 years ago. He quit after 8.00 years of use. He has never used smokeless tobacco. Mr. Kreuzer reports that he does not drink alcohol.   Review of Systems CONSTITUTIONAL: No weight loss, fever, chills, weakness or fatigue.  HEENT: Eyes: No visual loss, blurred vision, double vision or yellow sclerae.No hearing loss, sneezing, congestion, runny nose or sore throat.  SKIN: No rash or itching.  CARDIOVASCULAR: per hpi RESPIRATORY: No shortness of breath, cough or sputum.  GASTROINTESTINAL: No anorexia, nausea, vomiting or  diarrhea. No abdominal pain or blood.  GENITOURINARY: No burning on urination, no polyuria NEUROLOGICAL: No headache, dizziness, syncope, paralysis, ataxia, numbness or tingling in the extremities. No change in bowel or bladder control.  MUSCULOSKELETAL: No muscle, back pain, joint pain or stiffness.  LYMPHATICS: No enlarged nodes. No history of splenectomy.  PSYCHIATRIC: No history of depression or anxiety.  ENDOCRINOLOGIC: No reports of sweating, cold or heat intolerance. No polyuria or polydipsia.  Marland Kitchen   Physical Examination Vitals:   10/14/16 1105  BP: 128/74  Pulse: 99   Vitals:   10/14/16 1105  Weight: 266 lb (120.7 kg)  Height: 5\' 6"  (1.676 m)    Gen: resting comfortably, no acute distress HEENT: no scleral icterus, pupils equal round and reactive, no palptable cervical adenopathy,  CV: RRR, no m/r/g, no jvd Resp: Clear to auscultation bilaterally GI: abdomen is soft, non-tender, non-distended, normal bowel sounds, no hepatosplenomegaly MSK: extremities are warm, no edema.  Skin: warm, no rash Neuro:  no focal deficits Psych: appropriate affect   Diagnostic Studies 04/2013 Echo Study Conclusions  - Procedure narrative: Transthoracic echocardiography. Technically difficult study with reduced echocardiographic windows. - Left ventricle: The cavity size was normal. There was moderate concentric hypertrophy. Systolic function was vigorous. The estimated ejection fraction was in the range of 65% to 70%. Wall motion was normal; there were no regional wall motion abnormalities. Doppler parameters are consistent with abnormal left ventricular relaxation (grade 1 diastolic dysfunction). The E/e' ratio is <10, suggesting normal LV filling pressure. - Left atrium: The atrium was normal in size. - Tricuspid valve: Mild regurgitation. - Pericardium, extracardiac: There was no pericardial effusion.  08/2016 echo  Study Conclusions  - Left ventricle: The cavity size was  normal. Wall thickness was   increased in a pattern of mild LVH. Systolic function was normal.   The estimated ejection fraction  was in the range of 60% to 65%.   Wall motion was normal; there were no regional wall motion   abnormalities. Left ventricular diastolic function parameters   were normal. - Aortic valve: Mildly calcified annulus. Trileaflet; mildly   calcified leaflets. - Mitral valve: There was trivial regurgitation. - Right atrium: Central venous pressure (est): 3 mm Hg. - Tricuspid valve: There was trivial regurgitation. - Pulmonary arteries: PA peak pressure: 31 mm Hg (S). - Pericardium, extracardiac: There was no pericardial effusion.  Impressions:  - Mild LVH with LVEF 60-65% and grossly normal diastolic function.   Trivial mitral regurgitation. Mildly sclerotic aortic valve.   Trivial tricuspid regurgitation with PASP estimated 31 mmHg,   normal range.   Assessment and Plan   1. Chronic diastolic heart failure  - no current symptoms - continue current diuretics   2. Hyperlipidemia -he will continue current statin - request labs from pcp   3. HTN -historically labile - currently at goal, continue current meds  4. Syncope - probable orthostatic syncope, no repeat episodes - continue to monitor. Fairly labile volume status  5. CVA - he was to start ASA after recent discharge, however has allergy - will start plavix 75mg  daily for secondary stroke prevention  F/u 3 months   Arnoldo Lenis, M.D.

## 2016-10-14 NOTE — Patient Instructions (Signed)
Your physician recommends that you schedule a follow-up appointment in: 3 Months with Dr. Harl Bowie   Your physician has recommended you make the following change in your medication:  Stop Taking Aspirin   Start Taking Plavix 75 mg Daily   I have requested your lab results from Dr. Gerarda Fraction  If you need a refill on your cardiac medications before your next appointment, please call your pharmacy.  Thank you for choosing [facility]!

## 2016-10-19 ENCOUNTER — Other Ambulatory Visit: Payer: Self-pay

## 2016-10-19 MED ORDER — TORSEMIDE 20 MG PO TABS: 40.0000 mg | ORAL_TABLET | Freq: Every day | ORAL | 3 refills | Status: DC

## 2016-10-19 MED ORDER — METOLAZONE 2.5 MG PO TABS: 2.5000 mg | ORAL_TABLET | ORAL | 0 refills | Status: DC

## 2016-10-20 ENCOUNTER — Ambulatory Visit (INDEPENDENT_AMBULATORY_CARE_PROVIDER_SITE_OTHER): Payer: PPO | Admitting: Cardiology

## 2016-10-20 ENCOUNTER — Encounter: Payer: Self-pay | Admitting: Cardiology

## 2016-10-20 DIAGNOSIS — I5033 Acute on chronic diastolic (congestive) heart failure: Secondary | ICD-10-CM

## 2016-10-20 NOTE — Patient Instructions (Signed)
Your physician recommends that you schedule a follow-up appointment in: 3 weeks with Arnold Long NP   Take Torsemide as directed, 2-20 mg tablets (40 mg total) daily.I understand the home health nurse has informed [facility]    No labs or testing ordered today    Thank you for choosing [facility] !

## 2016-10-20 NOTE — Assessment & Plan Note (Signed)
Pt seen today after his Torsemide got dropped by mistake- picked up by [facility]

## 2016-10-20 NOTE — Progress Notes (Signed)
10/20/2016 [name] [name]   [birth year]  [id number]  Primary Physician Redmond School, MD Primary Cardiologist: Dr Harl Bowie  HPI:  76 y/o followed by Dr Harl Bowie. 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 He has chronic lower extremity edema which has been attributed to chronic Rt heart and diastolic CHF.He was just admitted in April with CHF. He saw Dr Harl Bowie in follow up last week- 10/14/16. He is in the office today after the home health RN noted he wasn't on Torsemide in his [place]. The pt has noted increased edema and his wgt is up.    Current Outpatient Prescriptions  Medication Sig Dispense Refill  . acetaminophen (TYLENOL) 325 MG tablet Take 650 mg by mouth every 6 (six) hours as needed.    Marland Kitchen atorvastatin (LIPITOR) 40 MG tablet Take 1 tablet (40 mg total) by mouth daily at 6 PM. 30 tablet 0  . bisacodyl (DULCOLAX) 5 MG EC tablet Take 1 tablet (5 mg total) by mouth daily. 30 tablet 0  . brimonidine (ALPHAGAN) 0.2 % ophthalmic solution Place 1 drop into the right eye 2 times daily.    . calcium citrate (CALCITRATE - DOSED IN MG ELEMENTAL CALCIUM) 950 MG tablet Take 200 mg of elemental calcium by mouth daily. Takes 3 times per week    . Cholecalciferol (VITAMIN D3) 1000 units CAPS Take 1 capsule by mouth. Takes 3 times per week    . clopidogrel (PLAVIX) 75 MG tablet Take 1 tablet (75 mg total) by mouth daily. 90 tablet 3  . docusate sodium (COLACE) 100 MG capsule Take 200 mg by mouth daily as needed for mild constipation. Reported on 09/11/2015    . dorzolamide-timolol (COSOPT) 22.3-6.8 MG/ML ophthalmic solution Place 1 drop into both eyes 2 (two) times daily.    . ferrous sulfate 325 (65 FE) MG tablet Take 325 mg by mouth daily with breakfast.    . latanoprost (XALATAN) 0.005 % ophthalmic solution Place 1 drop into both eyes at bedtime.    . magnesium oxide (MAG-OX) 400 MG tablet Take 400 mg by mouth daily. Takes 2 times per week    . Menthol, Topical Analgesic, (BIOFREEZE) 4  % GEL To be used by therapy dept as needed    . metolazone (ZAROXOLYN) 2.5 MG tablet Take 1 tablet (2.5 mg total) by mouth every Wednesday. 10 tablet 0  . metoprolol tartrate (LOPRESSOR) 25 MG tablet Take 1 tablet (25 mg total) by mouth daily. 90 tablet 3  . Oxycodone HCl 10 MG TABS Take 1 tablet (10 mg total) by mouth 2 (two) times daily as needed (pain). 60 tablet 0  . Polyethyl Glycol-Propyl Glycol (LUBRICANT EYE DROPS) 0.4-0.3 % SOLN Place 1 drop into both eyes daily as needed (for lubricant eye drops).     . potassium chloride (K-DUR,KLOR-CON) 10 MEQ tablet Take 40 mEq by mouth 2 (two) times daily.     Marland Kitchen tiZANidine (ZANAFLEX) 4 MG tablet Take 4 mg by mouth 2 (two) times daily.     Marland Kitchen torsemide (DEMADEX) 20 MG tablet Take 2 tablets (40 mg total) by mouth daily. 180 tablet 3   No current facility-administered medications for this visit.     Allergies  Allergen Reactions  . Aspirin Other (See Comments)    Nausea and upset stomach    . Nexium [Esomeprazole Magnesium] Other (See Comments)    Patient said it messed his stomach up    Past Medical History:  Diagnosis Date  .  Bilateral lower leg cellulitis   . BPH (benign prostatic hyperplasia)   . Cataract    left  . CHF (congestive heart failure) ([place])   . Chronic diastolic HF (heart failure) ([place])   . Collagen vascular disease ([place])    history of venous insufficency and LE cellulitis  . Complication of anesthesia    2012 due to sleep apnea pt has had difficulty being put under  and waking up from anesthesia  . Deep venous insufficiency   . GERD (gastroesophageal reflux disease)   . Glaucoma    left  . Headache   . Heel spur    right heel  . History of bladder infections   . History of colonic polyps   . Hyperlipidemia   . Hypertension    Sees Dr. Debara Pickett  . Hypothyroidism    hypothyroid denies no rx  . Iron deficiency 07/07/2016  . Kidney stones    passed them  . Obesity   . Osteoarthritis   . PONV (postoperative nausea  and vomiting)   . PUD (peptic ulcer disease)   . Ringing in ears   . S/P colonoscopy 2007, Feb and March 2012   hx of adenomas, left-sided diverticula, On 07/2010 TCS, fresh blood and clot noted coming from TI.  . S/P endoscopy 2007, 2012   2007: nl, May 2012: antral erosions  . SIRS (systemic inflammatory response syndrome) ([place]) 02/27/2012  . Sleep apnea    uses cpap  occ "unable to use lately due to nasal pain when use" - sleep study 2008 AHI 48.80/hr and 56.70hr during REM  . Thrombocytopenia ([place]) 12/30/2011   Stable    Social History   Social History  . Marital status: Widowed    Spouse name: Roger Kill  . Number of children: 5  . Years of education: College   Occupational History  . disability Retired   Social History Main Topics  . Smoking status: Former Smoker    Years: 8.00    Types: Cigars    Start date: 11/20/1959    Quit date: 08/10/1973  . Smokeless tobacco: Never Used  . Alcohol use No     Comment: denies  . Drug use: No  . Sexual activity: No   Other Topics Concern  . Not on file   Social History Narrative   Patient lives at home with spouse and son.   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         Family History  Problem Relation Age of Onset  . Colon cancer Father 22       deceased  . Prostate cancer Father   . Pancreatic cancer Mother 42       deceased  . Prostate cancer Brother   . Arthritis Son   . Arthritis Son   . Diabetes Mellitus II Son      Review of Systems: General: negative for chills, fever, night sweats or weight changes.  Cardiovascular: negative for chest pain, dyspnea on exertion, edema, orthopnea, palpitations, paroxysmal nocturnal dyspnea or shortness of breath Dermatological: negative for rash Respiratory: negative for cough or wheezing Urologic: negative for hematuria Abdominal: negative for nausea, vomiting, diarrhea, bright red blood per rectum, melena, or hematemesis Neurologic:  negative for visual changes, syncope, or dizziness All other systems reviewed and are otherwise negative except as noted above.    Blood pressure 109/68, pulse 88, height 5\' 9"  (1.753 m), weight 280 lb (127 kg), SpO2 90 %.  General appearance: alert, cooperative, no distress and morbidly obese Lungs: decreased breath sounds Heart: regular rate and rhythm Extremities: 2+ LE edema Neurologic: Grossly normal    ASSESSMENT AND PLAN:   1. Acute on chronic diastolic CHF Pt seen today after his Torsemide got dropped by mistake- picked up by [facility]  2. Hyperlipidemia - he continues to be compliant with statin  3.HTN - he reports home bp's  130s/80s   4. Syncope - admitted 08/2016 with syncope.  - orthostatic on admission - no repeat episodes -  5. CVA - noted during recent admit 08/2016 by imaging - startd on ASA 325mg  by neuro  6. OSA - followed by Dr Luan Pulling  PLAN  Resume torsemide 40 mg. F/U 3 weeks with APP.   Kerin Ransom PA-C 10/20/2016 4:11 PM

## 2016-10-24 ENCOUNTER — Emergency Department (HOSPITAL_COMMUNITY)
Admission: EM | Admit: 2016-10-24 | Discharge: 2016-10-24 | Disposition: A | Discharge status: Home / Self Care | Payer: PPO | Attending: Emergency Medicine | Admitting: Emergency Medicine

## 2016-10-24 ENCOUNTER — Encounter (HOSPITAL_COMMUNITY): Payer: Self-pay

## 2016-10-24 ENCOUNTER — Emergency Department (HOSPITAL_COMMUNITY): Payer: PPO

## 2016-10-24 DIAGNOSIS — I2510 Atherosclerotic heart disease of native coronary artery without angina pectoris: Secondary | ICD-10-CM | POA: Insufficient documentation

## 2016-10-24 DIAGNOSIS — Z87891 Personal history of nicotine dependence: Secondary | ICD-10-CM | POA: Diagnosis not present

## 2016-10-24 DIAGNOSIS — I110 Hypertensive heart disease with heart failure: Secondary | ICD-10-CM | POA: Insufficient documentation

## 2016-10-24 DIAGNOSIS — G8929 Other chronic pain: Secondary | ICD-10-CM | POA: Diagnosis not present

## 2016-10-24 DIAGNOSIS — I5032 Chronic diastolic (congestive) heart failure: Secondary | ICD-10-CM | POA: Insufficient documentation

## 2016-10-24 DIAGNOSIS — E039 Hypothyroidism, unspecified: Secondary | ICD-10-CM | POA: Diagnosis not present

## 2016-10-24 DIAGNOSIS — M545 Low back pain: Secondary | ICD-10-CM | POA: Diagnosis not present

## 2016-10-24 DIAGNOSIS — Z79899 Other long term (current) drug therapy: Secondary | ICD-10-CM | POA: Insufficient documentation

## 2016-10-24 HISTORY — DX: Other chronic pain: G89.29

## 2016-10-24 HISTORY — DX: Need for assistance with personal care: Z74.1

## 2016-10-24 HISTORY — DX: Low back pain: M54.5

## 2016-10-24 HISTORY — DX: Low back pain, unspecified: M54.50

## 2016-10-24 LAB — URINALYSIS, ROUTINE W REFLEX MICROSCOPIC
BILIRUBIN URINE: NEGATIVE
Glucose, UA: NEGATIVE mg/dL
HGB URINE DIPSTICK: NEGATIVE
Ketones, ur: NEGATIVE mg/dL
Leukocytes, UA: NEGATIVE
Nitrite: NEGATIVE
PH: 8 (ref 5.0–8.0)
Protein, ur: NEGATIVE mg/dL
Specific Gravity, Urine: 1.01 (ref 1.005–1.030)

## 2016-10-24 LAB — CBC WITH DIFFERENTIAL/PLATELET
Basophils Absolute: 0 10*3/uL (ref 0.0–0.1)
Basophils Relative: 0 %
Eosinophils Absolute: 0.1 10*3/uL (ref 0.0–0.7)
Eosinophils Relative: 1 %
HEMATOCRIT: 41.8 % (ref 39.0–52.0)
HEMOGLOBIN: 13.8 g/dL (ref 13.0–17.0)
LYMPHS ABS: 1.1 10*3/uL (ref 0.7–4.0)
LYMPHS PCT: 11 %
MCH: 26.6 pg (ref 26.0–34.0)
MCHC: 33 g/dL (ref 30.0–36.0)
MCV: 80.5 fL (ref 78.0–100.0)
MONOS PCT: 6 %
Monocytes Absolute: 0.6 10*3/uL (ref 0.1–1.0)
NEUTROS ABS: 8.3 10*3/uL — AB (ref 1.7–7.7)
NEUTROS PCT: 82 %
Platelets: 122 10*3/uL — ABNORMAL LOW (ref 150–400)
RBC: 5.19 MIL/uL (ref 4.22–5.81)
RDW: 18.1 % — ABNORMAL HIGH (ref 11.5–15.5)
WBC: 10.1 10*3/uL (ref 4.0–10.5)

## 2016-10-24 LAB — BASIC METABOLIC PANEL
ANION GAP: 7 (ref 5–15)
BUN: 13 mg/dL (ref 6–20)
CHLORIDE: 96 mmol/L — AB (ref 101–111)
CO2: 35 mmol/L — AB (ref 22–32)
CREATININE: 1.26 mg/dL — AB (ref 0.61–1.24)
Calcium: 9.4 mg/dL (ref 8.9–10.3)
GFR calc Af Amer: >60 mL/min (ref >60)
GFR calc non Af Amer: 54 mL/min — ABNORMAL LOW (ref >60)
Glucose, Bld: 117 mg/dL — ABNORMAL HIGH (ref 65–99)
POTASSIUM: 3.5 mmol/L (ref 3.5–5.1)
Sodium: 138 mmol/L (ref 135–145)

## 2016-10-24 MED ORDER — METHOCARBAMOL 500 MG PO TABS: 500.0000 mg | ORAL_TABLET | Freq: Two times a day (BID) | ORAL | 0 refills | Status: DC

## 2016-10-24 MED ORDER — OXYCODONE HCL 10 MG PO TABS: 10.0000 mg | ORAL_TABLET | Freq: Two times a day (BID) | ORAL | 0 refills | Status: DC | PRN

## 2016-10-24 MED ORDER — OXYCODONE HCL 5 MG PO TABS
10.0000 mg | ORAL_TABLET | Freq: Once | ORAL | Status: AC
  Administered 2016-10-24: 10 mg via ORAL
  Filled 2016-10-24: qty 2

## 2016-10-24 NOTE — Discharge Instructions (Signed)
Take the prescriptions as directed.  Apply moist heat or ice to the area(s) of discomfort, for 15 minutes at a time, several times per day for the next few days.  Do not fall asleep on a heating or ice pack.  Call your regular medical doctor and your Pain Management doctor tomorrow morning to schedule a follow up appointment in the next 2 days.  Return to the Emergency Department immediately if worsening.

## 2016-10-24 NOTE — ED Provider Notes (Signed)
[facility] DEPT Provider Note   CSN: [id number] Arrival date & time: 10/24/16  0857     History   Chief Complaint Chief Complaint  Patient presents with  . Back Pain    HPI [name] [name] is a 76 y.o. male.  HPI  Pt was seen at 1230. Per pt, c/o gradual onset and persistence of constant acute flair of his chronic low back "pain" for the past several days. States the pain radiates into his bilat LE's.  Denies any change in his usual chronic pain pattern.  Pain worsens with palpation of the area and body position changes. States he has been taking his oxycodone without relief. States his Neurosurgeon Dr. Christella Noa told him "he can't do anything more for me." States he receives injections to his low back, most recently approximately 8 weeks ago, "but that's not working either."  Denies incont/retention of bowel or bladder, no saddle anesthesia, no focal motor weakness, no tingling/numbness in extremities, no fevers, no injury, no abd pain.   The symptoms have been associated with no other complaints. The patient has a significant history of similar symptoms previously, recently being evaluated for this complaint and multiple prior evals for same.    Past Medical History:  Diagnosis Date  . Assistance needed for mobility    cane, walker  . Bilateral lower leg cellulitis   . BPH (benign prostatic hyperplasia)   . Cataract    left  . CHF (congestive heart failure) ([place])   . Chronic diastolic HF (heart failure) ([place])   . Chronic low back pain   . Collagen vascular disease ([place])    history of venous insufficency and LE cellulitis  . Complication of anesthesia    2012 due to sleep apnea pt has had difficulty being put under  and waking up from anesthesia  . Deep venous insufficiency   . GERD (gastroesophageal reflux disease)   . Glaucoma    left  . Headache   . Heel spur    right heel  . History of bladder infections   . History of colonic polyps   . Hyperlipidemia   .  Hypertension    Sees Dr. Debara Pickett  . Hypothyroidism    hypothyroid denies no rx  . Iron deficiency 07/07/2016  . Kidney stones    passed them  . Obesity   . Osteoarthritis   . PONV (postoperative nausea and vomiting)   . PUD (peptic ulcer disease)   . Ringing in ears   . S/P colonoscopy 2007, Feb and March 2012   hx of adenomas, left-sided diverticula, On 07/2010 TCS, fresh blood and clot noted coming from TI.  . S/P endoscopy 2007, 2012   2007: nl, May 2012: antral erosions  . SIRS (systemic inflammatory response syndrome) ([place]) 02/27/2012  . Sleep apnea    uses cpap  occ "unable to use lately due to nasal pain when use" - sleep study 2008 AHI 48.80/hr and 56.70hr during REM  . Thrombocytopenia ([place]) 12/30/2011   Stable    Patient Active Problem List   Diagnosis Date Noted  . Acute CVA (cerebrovascular accident) ([place]) 09/13/2016  . Stroke (cerebrum) ([place]) 09/13/2016  . Syncope 09/12/2016  . Iron deficiency 07/07/2016  . Back pain 03/22/2016  . Acute right-sided low back pain with right-sided sciatica   . S/P nasal septoplasty 10/07/2015  . Parotitis, acute 03/06/2015  . Nausea without vomiting 12/08/2014  . Dysphagia, pharyngoesophageal phase   . History of colonic polyps   . Hypokalemia 09/01/2013  .  Dehydration 09/01/2013  . Leukocytosis 09/01/2013  . Right-sided heart failure ([place]) 08/20/2013  . Acute on chronic diastolic CHF 95/62/1308  . Diastolic dysfunction by echo Dec 2014 07/30/2013  . Morbid obesity- BMI 44.5 07/30/2013  . Edema of both legs 07/30/2013  . Bilateral lower leg cellulitis 07/18/2013  . 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 OSA (obstructive sleep apnea) 11/27/2012  . Other spondylosis with radiculopathy, lumbar region 08/22/2012  . Bright red blood per rectum 06/15/2012  . Constipation 02/29/2012  . SIRS (systemic inflammatory response syndrome) ([place]) 02/27/2012  . BPH (benign prostatic hyperplasia) 02/27/2012  . Obesity 02/27/2012  .  Hyperlipidemia 02/27/2012  . Thrombocytopenia ([place]) 12/30/2011  . Hypothyroidism 10/27/2011  . Upper abdominal pain 10/27/2011  . Rectal bleeding 11/16/2010  . DIARRHEA 06/08/2010  . COLONIC POLYPS, ADENOMATOUS, HX OF 07/16/2009  . Essential hypertension 06/19/2009  . GASTROESOPHAGEAL REFLUX DISEASE, CHRONIC 06/19/2009  . FATTY LIVER DISEASE 06/19/2009  . CHOLELITHIASIS, SYMPTOMATIC 06/19/2009  . RENAL INSUFFICIENCY 06/19/2009  . ABDOMINAL BLOATING 06/19/2009    Past Surgical History:  Procedure Laterality Date  . BACK SURGERY    . CARDIAC CATHETERIZATION  02/27/2007   no sign of CAD, LVH, mod pulm HTN (Dr. Jackie Plum(  . CATARACT EXTRACTION W/ INTRAOCULAR LENS IMPLANT Right ~ 2012  . COLONOSCOPY N/A 05/28/2014   [facility]:QIONGEXB colonic polyps/colonic diverticulosis. Tubular adenomas. Next colonoscopy January 2021  . ESOPHAGOGASTRODUODENOSCOPY  Aug 2013   [facility], single 45mm pedunculated polyp otherwise normal. HYPERPLASTIC, NEGATIVE h.pylori  . ESOPHAGOGASTRODUODENOSCOPY N/A 05/28/2014   RMR:non-critical Schatzki's ring/HH. Benign gastritis  . INGUINAL HERNIA REPAIR Right 1941  . KNEE ARTHROSCOPY Left ~ 2000  . LAPAROSCOPIC CHOLECYSTECTOMY    . [place] SURGERY  2012  . MALONEY DILATION N/A 05/28/2014   Procedure: Venia Minks DILATION;  Surgeon: Daneil Dolin, MD;  Location: [facility];  Service: Endoscopy;  Laterality: N/A;  . NASAL SEPTOPLASTY W/ TURBINOPLASTY Bilateral 10/07/2015   Procedure: NASAL SEPTOPLASTY WITH TURBINATE REDUCTION;  Surgeon: Leta Baptist, MD;  Location: [facility];  Service: ENT;  Laterality: Bilateral;  . NASAL SEPTUM SURGERY Bilateral 10/07/2015    inferior turbinate resection  . NM MYOCAR PERF WALL MOTION  12/2006   dipyridamole myoview - stress images show mild perfusion defect in mid inferior walls with reversibility at rest; mild perfusion defect in mid inferolateral wall at stress with mild defect reversibility, EF 62%, abnormal but low risk study   . POSTERIOR LUMBAR  FUSION  2015  . SAVORY DILATION N/A 05/28/2014   Procedure: SAVORY DILATION;  Surgeon: Daneil Dolin, MD;  Location: [facility];  Service: Endoscopy;  Laterality: N/A;  . SHOULDER OPEN ROTATOR CUFF REPAIR Left early 2000s  . small bowel capsule study  07/2011   ?transient focal ischemia in distal small bowel to explain GI bleeding  . TRANSTHORACIC ECHOCARDIOGRAM  09/2008   EF 60-65%, mod conc LVH  . TRANSURETHRAL RESECTION OF PROSTATE    . TRANSURETHRAL RESECTION OF PROSTATE N/A 06/18/2013   Procedure: TRANSURETHRAL RESECTION OF THE PROSTATE (TURP);  Surgeon: Marissa Nestle, MD;  Location: [facility];  Service: Urology;  Laterality: N/A;       Home Medications    Prior to Admission medications   Medication Sig Start Date End Date Taking? Authorizing Provider  acetaminophen (TYLENOL) 325 MG tablet Take 650 mg by mouth every 6 (six) hours as needed.    [provider]  atorvastatin (LIPITOR) 40 MG tablet Take 1 tablet (40 mg  total) by mouth daily at 6 PM. 09/14/16   Raiford Noble Latif, DO  bisacodyl (DULCOLAX) 5 MG EC tablet Take 1 tablet (5 mg total) by mouth daily. 03/26/16   Thurnell Lose, MD  brimonidine (ALPHAGAN) 0.2 % ophthalmic solution Place 1 drop into the right eye 2 times daily. 04/11/16 04/11/17  [provider]  calcium citrate (CALCITRATE - DOSED IN MG ELEMENTAL CALCIUM) 950 MG tablet Take 200 mg of elemental calcium by mouth daily. Takes 3 times per week    [provider]  Cholecalciferol (VITAMIN D3) 1000 units CAPS Take 1 capsule by mouth. Takes 3 times per week    [provider]  clopidogrel (PLAVIX) 75 MG tablet Take 1 tablet (75 mg total) by mouth daily. 10/14/16   Arnoldo Lenis, MD  docusate sodium (COLACE) 100 MG capsule Take 200 mg by mouth daily as needed for mild constipation. Reported on 09/11/2015    [provider]  dorzolamide-timolol (COSOPT) 22.3-6.8 MG/ML ophthalmic solution Place 1 drop into both eyes 2  (two) times daily. 12/17/14   [provider]  ferrous sulfate 325 (65 FE) MG tablet Take 325 mg by mouth daily with breakfast.    [provider]  latanoprost (XALATAN) 0.005 % ophthalmic solution Place 1 drop into both eyes at bedtime.    [provider]  magnesium oxide (MAG-OX) 400 MG tablet Take 400 mg by mouth daily. Takes 2 times per week    [provider]  Menthol, Topical Analgesic, (BIOFREEZE) 4 % GEL To be used by therapy dept as needed    [provider]  metolazone (ZAROXOLYN) 2.5 MG tablet Take 1 tablet (2.5 mg total) by mouth every Wednesday. 10/19/16   Arnoldo Lenis, MD  metoprolol tartrate (LOPRESSOR) 25 MG tablet Take 1 tablet (25 mg total) by mouth daily. 04/08/16   Lendon Colonel, NP  Oxycodone HCl 10 MG TABS Take 1 tablet (10 mg total) by mouth 2 (two) times daily as needed (pain). 09/16/16   Reed, Tiffany L, DO  Polyethyl Glycol-Propyl Glycol (LUBRICANT EYE DROPS) 0.4-0.3 % SOLN Place 1 drop into both eyes daily as needed (for lubricant eye drops).     [provider]  potassium chloride (K-DUR,KLOR-CON) 10 MEQ tablet Take 40 mEq by mouth 2 (two) times daily.     [provider]  tiZANidine (ZANAFLEX) 4 MG tablet Take 4 mg by mouth 2 (two) times daily.     [provider]  torsemide (DEMADEX) 20 MG tablet Take 2 tablets (40 mg total) by mouth daily. 10/19/16   Arnoldo Lenis, MD    Family History Family History  Problem Relation Age of Onset  . Colon cancer Father 24       deceased  . Prostate cancer Father   . Pancreatic cancer Mother 107       deceased  . Prostate cancer Brother   . Arthritis Son   . Arthritis Son   . Diabetes Mellitus II Son     Social History Social History  Substance Use Topics  . Smoking status: Former Smoker    Years: 8.00    Types: Cigars    Start date: 11/20/1959    Quit date: 08/10/1973  . Smokeless tobacco: Never Used  . Alcohol use No     Comment:  denies     Allergies   Aspirin and Nexium [esomeprazole magnesium]   Review of Systems Review of Systems ROS: Statement: All systems negative except  as marked or noted in the HPI; Constitutional: Negative for fever and chills. ; ; Eyes: Negative for eye pain, redness and discharge. ; ; ENMT: Negative for ear pain, hoarseness, nasal congestion, sinus pressure and sore throat. ; ; Cardiovascular: Negative for chest pain, palpitations, diaphoresis, dyspnea and peripheral edema. ; ; Respiratory: Negative for cough, wheezing and stridor. ; ; Gastrointestinal: Negative for nausea, vomiting, diarrhea, abdominal pain, blood in stool, hematemesis, jaundice and rectal bleeding. . ; ; Genitourinary: Negative for dysuria, flank pain and hematuria. ; ; Musculoskeletal: +LBP. Negative for neck pain. Negative for swelling and trauma.; ; Skin: Negative for pruritus, rash, abrasions, blisters, bruising and skin lesion.; ; Neuro: Negative for headache, lightheadedness and neck stiffness. Negative for weakness, altered level of consciousness, altered mental status, extremity weakness, paresthesias, involuntary movement, seizure and syncope.     Physical Exam Updated Vital Signs BP 112/75 (BP Location: Right Arm)   Pulse 94   Temp 98.6 F (37 C) (Oral)   Resp 18   Ht 5\' 9"  (1.753 m)   Wt 127 kg (280 lb)   SpO2 94%   BMI 41.35 kg/m   Physical Exam 1235: Physical examination:  Nursing notes reviewed; Vital signs and O2 SAT reviewed;  Constitutional: Well developed, Well nourished, Well hydrated, In no acute distress; Head:  Normocephalic, atraumatic; Eyes: EOMI, PERRL, No scleral icterus; ENMT: Mouth and pharynx normal, Mucous membranes moist; Neck: Supple, Full range of motion, No lymphadenopathy; Cardiovascular: Regular rate and rhythm, No gallop; Respiratory: Breath sounds clear & equal bilaterally, No wheezes.  Speaking full sentences with ease, Normal respiratory effort/excursion; Chest: Nontender,  Movement normal; Abdomen: Soft, Nontender, Nondistended, Normal bowel sounds; Genitourinary: No CVA tenderness; Spine:  No midline CS, TS, LS tenderness. +TTP R>L lumbar paraspinal muscles.;; Extremities: Pulses normal, No tenderness, +3 pedal edema bilat. No calf asymmetry.; Neuro: AA&Ox3, Major CN grossly intact.  Speech clear. No gross focal motor or sensory deficits in extremities. Strength 5/5 equal bilat UE's and LE's, including great toe dorsiflexion.  DTR 2/4 equal bilat UE's and LE's.  No gross sensory deficits.  Neg straight leg raises bilat.; Skin: Color normal, Warm, Dry.    ED Treatments / Results  Labs (all labs ordered are listed, but only abnormal results are displayed)   EKG  EKG Interpretation None       Radiology   Procedures Procedures (including critical care time)  Medications Ordered in ED Medications  oxyCODONE (Oxy IR/ROXICODONE) immediate release tablet 10 mg (not administered)     Initial Impression / Assessment and Plan / ED Course  I have reviewed the triage vital signs and the nursing notes.  Pertinent labs & imaging results that were available during my care of the patient were reviewed by me and considered in my medical decision making (see chart for details).  MDM Reviewed: previous chart, nursing note and vitals Reviewed previous: labs, MRI and CT scan Interpretation: labs and CT scan   Results for orders placed or performed during the hospital encounter of 50/53/97  Basic metabolic panel  Result Value Ref Range   Sodium 138 135 - 145 mmol/L   Potassium 3.5 3.5 - 5.1 mmol/L   Chloride 96 (L) 101 - 111 mmol/L   CO2 35 (H) 22 - 32 mmol/L   Glucose, Bld 117 (H) 65 - 99 mg/dL   BUN 13 6 - 20 mg/dL   Creatinine, Ser 1.26 (H) 0.61 - 1.24 mg/dL   Calcium 9.4 8.9 - 10.3 mg/dL   GFR  calc non Af Amer 54 (L) >60 mL/min   GFR calc Af Amer >60 >60 mL/min   Anion gap 7 5 - 15  CBC with Differential  Result Value Ref Range   WBC 10.1 4.0 -  10.5 K/uL   RBC 5.19 4.22 - 5.81 MIL/uL   Hemoglobin 13.8 13.0 - 17.0 g/dL   HCT 41.8 39.0 - 52.0 %   MCV 80.5 78.0 - 100.0 fL   MCH 26.6 26.0 - 34.0 pg   MCHC 33.0 30.0 - 36.0 g/dL   RDW 18.1 (H) 11.5 - 15.5 %   Platelets 122 (L) 150 - 400 K/uL   Neutrophils Relative % 82 %   Neutro Abs 8.3 (H) 1.7 - 7.7 K/uL   Lymphocytes Relative 11 %   Lymphs Abs 1.1 0.7 - 4.0 K/uL   Monocytes Relative 6 %   Monocytes Absolute 0.6 0.1 - 1.0 K/uL   Eosinophils Relative 1 %   Eosinophils Absolute 0.1 0.0 - 0.7 K/uL   Basophils Relative 0 %   Basophils Absolute 0.0 0.0 - 0.1 K/uL  Urinalysis, Routine w reflex microscopic  Result Value Ref Range   Color, Urine YELLOW YELLOW   APPearance CLEAR CLEAR   Specific Gravity, Urine 1.010 1.005 - 1.030   pH 8.0 5.0 - 8.0   Glucose, UA NEGATIVE NEGATIVE mg/dL   Hgb urine dipstick NEGATIVE NEGATIVE   Bilirubin Urine NEGATIVE NEGATIVE   Ketones, ur NEGATIVE NEGATIVE mg/dL   Protein, ur NEGATIVE NEGATIVE mg/dL   Nitrite NEGATIVE NEGATIVE   Leukocytes, UA NEGATIVE NEGATIVE   Ct Lumbar Spine Wo Contrast Result Date: 10/24/2016 CLINICAL DATA:  Low back pain radiating down both legs over the last week. EXAM: CT LUMBAR SPINE WITHOUT CONTRAST TECHNIQUE: Multidetector CT imaging of the lumbar spine was performed without intravenous contrast administration. Multiplanar CT image reconstructions were also generated. COMPARISON:  CT abdomen 03/22/2016.  Lumbar MRI 01/29/2016. FINDINGS: Segmentation: 5 lumbar type vertebral bodies. Alignment: Normal Vertebrae: No fracture or primary bone lesion. Previous fusion L4-5 appears solid. Paraspinal and other soft tissues: Negative Disc levels: L1-2:  Negative. L2-3: Disc bulge.  Mild facet hypertrophy.  No compressive stenosis. L3-4: Disc bulge. Facet hypertrophy. Stenosis at this level which could be significant. L4-5: Previous posterior decompression, diskectomy and fusion. Apparent solid union. Wide patency of the canal and  foramina. Question healed fracture of the left pars region. Not significant. L5-S1: Bulging of the disc. Bilateral facet osteoarthritis. No canal stenosis. Mild foraminal narrowing, probably not compressive. Both sacroiliac joints show ankylosis. IMPRESSION: Satisfactory appearance at the discectomy and fusion level of L4-5. Multifactorial stenosis at L3-4 that could be significant. Facet arthropathy at L5-S1 without compressive stenosis. Mild stenosis at L2-3 without likely neural compression. Bilateral sacroiliac ankylosis. Electronically Signed   By: Nelson Chimes M.D.   On: 10/24/2016 13:24    1640:  Pt has ambulated with walker with steady gait, NAD. Labs per baseline. CT with known spinal stenosis. [place] Controlled Substance Database accessed:  Pt filled oxycodone 5mg /APAP 325mg , #120, on 10/05/16 by Dr. Gerarda Fraction; pt filled oxycodone 10mg ,#60, on 09/16/16 by Dr. Mariea Clonts. Long hx of chronic pain with multiple ED visits for same.  Pt endorses acute flair of his usual long standing chronic pain today, no change from his usual chronic pain pattern.  Pt encouraged to f/u with his PMD and Pain Management doctor for good continuity of care and control of his chronic pain.  Pt verb understanding. Dx and testing d/w pt and family.  Questions answered.  Verb understanding, agreeable to d/c home with outpt f/u.    Final Clinical Impressions(s) / ED Diagnoses   Final diagnoses:  None    New Prescriptions New Prescriptions   No medications on file     Francine Graven, DO 10/28/16 1301

## 2016-10-24 NOTE — ED Notes (Signed)
Pt ambulated well with minimal assistance. Pt walked with walker.

## 2016-10-24 NOTE — ED Triage Notes (Signed)
Pt reports lower back pain that radiates down both legs.   Denies any problems controlling bowels or bladder.  Reports 2 months ago he had a steroid injection in back.  Pt says last time his back hurt this bad he had to have surgery.

## 2016-10-25 DIAGNOSIS — E669 Obesity, unspecified: Secondary | ICD-10-CM | POA: Diagnosis not present

## 2016-10-25 DIAGNOSIS — I5032 Chronic diastolic (congestive) heart failure: Secondary | ICD-10-CM | POA: Diagnosis not present

## 2016-10-25 DIAGNOSIS — I872 Venous insufficiency (chronic) (peripheral): Secondary | ICD-10-CM | POA: Diagnosis not present

## 2016-10-25 DIAGNOSIS — Z87891 Personal history of nicotine dependence: Secondary | ICD-10-CM | POA: Diagnosis not present

## 2016-10-25 DIAGNOSIS — E039 Hypothyroidism, unspecified: Secondary | ICD-10-CM | POA: Diagnosis not present

## 2016-10-25 DIAGNOSIS — I110 Hypertensive heart disease with heart failure: Secondary | ICD-10-CM | POA: Diagnosis not present

## 2016-10-25 DIAGNOSIS — I69354 Hemiplegia and hemiparesis following cerebral infarction affecting left non-dominant side: Secondary | ICD-10-CM | POA: Diagnosis not present

## 2016-10-25 DIAGNOSIS — N400 Enlarged prostate without lower urinary tract symptoms: Secondary | ICD-10-CM | POA: Diagnosis not present

## 2016-10-25 DIAGNOSIS — D509 Iron deficiency anemia, unspecified: Secondary | ICD-10-CM | POA: Diagnosis not present

## 2016-10-25 DIAGNOSIS — G4730 Sleep apnea, unspecified: Secondary | ICD-10-CM | POA: Diagnosis not present

## 2016-10-25 DIAGNOSIS — E785 Hyperlipidemia, unspecified: Secondary | ICD-10-CM | POA: Diagnosis not present

## 2016-10-25 DIAGNOSIS — Z79891 Long term (current) use of opiate analgesic: Secondary | ICD-10-CM | POA: Diagnosis not present

## 2016-11-07 DIAGNOSIS — H401123 Primary open-angle glaucoma, left eye, severe stage: Secondary | ICD-10-CM | POA: Diagnosis not present

## 2016-11-10 ENCOUNTER — Encounter: Payer: Self-pay | Admitting: Adult Health

## 2016-11-10 ENCOUNTER — Ambulatory Visit (INDEPENDENT_AMBULATORY_CARE_PROVIDER_SITE_OTHER): Payer: PPO | Admitting: Adult Health

## 2016-11-10 ENCOUNTER — Telehealth: Payer: Self-pay

## 2016-11-10 VITALS — BP 128/68 | HR 81 | Ht 67.0 in | Wt 263.0 lb

## 2016-11-10 DIAGNOSIS — I5032 Chronic diastolic (congestive) heart failure: Secondary | ICD-10-CM

## 2016-11-10 DIAGNOSIS — E7800 Pure hypercholesterolemia, unspecified: Secondary | ICD-10-CM

## 2016-11-10 DIAGNOSIS — I639 Cerebral infarction, unspecified: Secondary | ICD-10-CM | POA: Diagnosis not present

## 2016-11-10 DIAGNOSIS — I10 Essential (primary) hypertension: Secondary | ICD-10-CM | POA: Diagnosis not present

## 2016-11-10 MED ORDER — TORSEMIDE 20 MG PO TABS: 40.0000 mg | ORAL_TABLET | Freq: Every day | ORAL | 3 refills | Status: DC

## 2016-11-10 MED ORDER — CLOPIDOGREL BISULFATE 75 MG PO TABS: 75.0000 mg | ORAL_TABLET | Freq: Every day | ORAL | 3 refills | Status: DC

## 2016-11-10 MED ORDER — METOPROLOL TARTRATE 25 MG PO TABS: 25.0000 mg | ORAL_TABLET | Freq: Every day | ORAL | 3 refills | Status: DC

## 2016-11-10 MED ORDER — ATORVASTATIN CALCIUM 40 MG PO TABS: 40.0000 mg | ORAL_TABLET | Freq: Every day | ORAL | 6 refills | Status: DC

## 2016-11-10 MED ORDER — METOLAZONE 2.5 MG PO TABS: 2.5000 mg | ORAL_TABLET | ORAL | 3 refills | Status: DC

## 2016-11-10 NOTE — Telephone Encounter (Signed)
[place] and spoke with [facility]. The pt has not received his Plavix. It has been an on going problem that has been addressed two times prior. I am going to see if his insurance company will allow him to use a different pharmacy that can package his medications as well. I advised pt to go pick up his plavix today from [facility]. He has plenty of his medications.

## 2016-11-10 NOTE — Progress Notes (Signed)
Cardiology Office Note   Date:  11/10/2016   ID:  [name] [name], DOB 10/23/1940, MRN [id number]  PCP:  Redmond School, MD  Cardiologist:  [facility]  Chief Complaint  Patient presents with  . Congestive Heart Failure  . Hyperlipidemia  . Hypertension      History of Present Illness: [name] [name] is a 76 y.o. male who presents for ongoing assessment and management of chronic diastolic heart failure, hyperlipidemia, hypertension, history of syncope with admission to the hospital on 08/22/2016 found to be orthostatic, history of CVA on 4 2018, and history of obstructive sleep apnea followed by Dr. Luan Pulling.  The patient was last seen in the office on 10/20/2016 by Kerin Ransom, [place]. At that time the patient had not been taking diuretic as directed was complaining of lower extremity edema. It apparently was not in his pill pack, managed by home health RN. This was restarted at  40 mg daily, with follow-up today to evaluate response to medication. On last office visit patient's weight was 280 pounds,  He is feeling much better. He has lost 17 pounds since being seen last and restarting diuretic. New issue is that his pharmacy has forgotten to put Plavix in his medication PACs. He has not been on this for approximately 3-4 weeks. He continues to take aspirin daily. Otherwise he is feeling much better, is breathing better, feels less tired and sluggish.  Past Medical History:  Diagnosis Date  . Assistance needed for mobility    cane, walker  . Bilateral lower leg cellulitis   . BPH (benign prostatic hyperplasia)   . Cataract    left  . CHF (congestive heart failure) ([place])   . Chronic diastolic HF (heart failure) ([place])   . Chronic low back pain   . Collagen vascular disease ([place])    history of venous insufficency and LE cellulitis  . Complication of anesthesia    2012 due to sleep apnea pt has had difficulty being put under  and waking up from anesthesia  . Deep venous insufficiency   .  GERD (gastroesophageal reflux disease)   . Glaucoma    left  . Headache   . Heel spur    right heel  . History of bladder infections   . History of colonic polyps   . Hyperlipidemia   . Hypertension    Sees Dr. Debara Pickett  . Hypothyroidism    hypothyroid denies no rx  . Iron deficiency 07/07/2016  . Kidney stones    passed them  . Obesity   . Osteoarthritis   . PONV (postoperative nausea and vomiting)   . PUD (peptic ulcer disease)   . Ringing in ears   . S/P colonoscopy 2007, Feb and March 2012   hx of adenomas, left-sided diverticula, On 07/2010 TCS, fresh blood and clot noted coming from TI.  . S/P endoscopy 2007, 2012   2007: nl, May 2012: antral erosions  . SIRS (systemic inflammatory response syndrome) ([place]) 02/27/2012  . Sleep apnea    uses cpap  occ "unable to use lately due to nasal pain when use" - sleep study 2008 AHI 48.80/hr and 56.70hr during REM  . Thrombocytopenia ([place]) 12/30/2011   Stable    Past Surgical History:  Procedure Laterality Date  . BACK SURGERY    . CARDIAC CATHETERIZATION  02/27/2007   no sign of CAD, LVH, mod pulm HTN (Dr. Jackie Plum(  . CATARACT EXTRACTION W/ INTRAOCULAR LENS IMPLANT Right ~ 2012  . COLONOSCOPY N/A 05/28/2014  [id number] colonic polyps/colonic diverticulosis. Tubular adenomas. Next colonoscopy January 2021  . ESOPHAGOGASTRODUODENOSCOPY  Aug 2013   [facility], single 70mm pedunculated polyp otherwise normal. HYPERPLASTIC, NEGATIVE h.pylori  . ESOPHAGOGASTRODUODENOSCOPY N/A 05/28/2014   RMR:non-critical Schatzki's ring/HH. Benign gastritis  . INGUINAL HERNIA REPAIR Right 1941  . KNEE ARTHROSCOPY Left ~ 2000  . LAPAROSCOPIC CHOLECYSTECTOMY    . [place] SURGERY  2012  . MALONEY DILATION N/A 05/28/2014   Procedure: Venia Minks DILATION;  Surgeon: Daneil Dolin, MD;  [facility];  Service: Endoscopy;  Laterality: N/A;  . NASAL SEPTOPLASTY W/ TURBINOPLASTY Bilateral 10/07/2015   Procedure: NASAL SEPTOPLASTY WITH TURBINATE REDUCTION;   Surgeon: Leta Baptist, MD;  Location: [place];  Service: ENT;  Laterality: Bilateral;  . NASAL SEPTUM SURGERY Bilateral 10/07/2015    inferior turbinate resection  . NM MYOCAR PERF WALL MOTION  12/2006   dipyridamole myoview - stress images show mild perfusion defect in mid inferior walls with reversibility at rest; mild perfusion defect in mid inferolateral wall at stress with mild defect reversibility, EF 62%, abnormal but low risk study   . POSTERIOR LUMBAR FUSION  2015  . SAVORY DILATION N/A 05/28/2014   Procedure: SAVORY DILATION;  Surgeon: Daneil Dolin, MD;  Location: [facility];  Service: Endoscopy;  Laterality: N/A;  . SHOULDER OPEN ROTATOR CUFF REPAIR Left early 2000s  . small bowel capsule study  07/2011   ?transient focal ischemia in distal small bowel to explain GI bleeding  . TRANSTHORACIC ECHOCARDIOGRAM  09/2008   EF 60-65%, mod conc LVH  . TRANSURETHRAL RESECTION OF PROSTATE    . TRANSURETHRAL RESECTION OF PROSTATE N/A 06/18/2013   Procedure: TRANSURETHRAL RESECTION OF THE PROSTATE (TURP);  Surgeon: Marissa Nestle, MD;  Location: [facility];  Service: Urology;  Laterality: N/A;     Current Outpatient Prescriptions  Medication Sig Dispense Refill  . atorvastatin (LIPITOR) 40 MG tablet Take 1 tablet (40 mg total) by mouth daily at 6 PM. 90 tablet 6  . bisacodyl (DULCOLAX) 5 MG EC tablet Take 1 tablet (5 mg total) by mouth daily. 30 tablet 0  . brimonidine (ALPHAGAN) 0.2 % ophthalmic solution Place 1 drop into the right eye 2 times daily.    . calcium citrate (CALCITRATE - DOSED IN MG ELEMENTAL CALCIUM) 950 MG tablet Take 200 mg of elemental calcium by mouth daily. Takes 3 times per week    . Cholecalciferol (VITAMIN D3) 1000 units CAPS Take 1 capsule by mouth. Takes 3 times per week    . dorzolamide-timolol (COSOPT) 22.3-6.8 MG/ML ophthalmic solution Place 1 drop into both eyes 2 (two) times daily.    . ferrous sulfate 325 (65 FE) MG tablet Take 325 mg by mouth daily with breakfast.      . latanoprost (XALATAN) 0.005 % ophthalmic solution Place 1 drop into both eyes at bedtime.    . magnesium oxide (MAG-OX) 400 MG tablet Take 400 mg by mouth daily. Takes 2 times per week    . Menthol, Topical Analgesic, (BIOFREEZE) 4 % GEL To be used by therapy dept as needed    . methocarbamol (ROBAXIN) 500 MG tablet Take 1 tablet (500 mg total) by mouth 2 (two) times daily. 20 tablet 0  . [START ON 11/16/2016] metolazone (ZAROXOLYN) 2.5 MG tablet Take 1 tablet (2.5 mg total) by mouth every Wednesday. 10 tablet 3  . metoprolol tartrate (LOPRESSOR) 25 MG tablet Take 1 tablet (25 mg total) by mouth daily. 90 tablet 3  . Oxycodone  HCl 10 MG TABS Take 1 tablet (10 mg total) by mouth 2 (two) times daily as needed (pain). 60 tablet 0  . Oxycodone HCl 10 MG TABS Take 1 tablet (10 mg total) by mouth 2 (two) times daily as needed for severe pain or breakthrough pain. 4 tablet 0  . Polyethyl Glycol-Propyl Glycol (LUBRICANT EYE DROPS) 0.4-0.3 % SOLN Place 1 drop into both eyes daily as needed (for lubricant eye drops).     . potassium chloride (K-DUR,KLOR-CON) 10 MEQ tablet Take 40 mEq by mouth 2 (two) times daily.     Marland Kitchen torsemide (DEMADEX) 20 MG tablet Take 2 tablets (40 mg total) by mouth daily. 180 tablet 3  . clopidogrel (PLAVIX) 75 MG tablet Take 1 tablet (75 mg total) by mouth daily. 90 tablet 3   No current facility-administered medications for this visit.     Allergies:   Aspirin and Nexium [esomeprazole magnesium]    Social History:  The patient  reports that he quit smoking about 43 years ago. His smoking use included Cigars. He started smoking about 57 years ago. He quit after 8.00 years of use. He has never used smokeless tobacco. He reports that he does not drink alcohol or use drugs.   Family History:  The patient's family history includes Arthritis in his son and son; Colon cancer (age of onset: 6) in his father; Diabetes Mellitus II in his son; Pancreatic cancer (age of onset: 9) in his  mother; Prostate cancer in his brother and father.    ROS: All other systems are reviewed and negative. Unless otherwise mentioned in H&P    PHYSICAL EXAM: VS:  BP 128/68   Pulse 81   Ht 5\' 7"  (1.702 m)   Wt 263 lb (119.3 kg)   SpO2 90%   BMI 41.19 kg/m  , BMI Body mass index is 41.19 kg/m. GEN: Well nourished, well developed, in no acute distress obese HEENT: normal Xanthomas noted bilateral eyelids Neck: no JVD, carotid bruits, or masses Cardiac: Distant heart sounds, RRR; no murmurs, rubs, or gallops,no edema  Respiratory:  clear to auscultation bilaterally, normal work of breathing GI: soft, nontender, nondistended, + BS MS: no deformity or atrophy  Skin: warm and dry, no rash Neuro:  Strength and sensation are intact Psych: euthymic mood, full affect    Recent Labs: 03/22/2016: B Natriuretic Peptide 24.0 09/16/2016: TSH 3.795 09/17/2016: ALT 17 10/01/2016: Magnesium 2.0 10/24/2016: BUN 13; Creatinine, Ser 1.26; Hemoglobin 13.8; Platelets 122; Potassium 3.5; Sodium 138    Lipid Panel    Component Value Date/Time   CHOL 179 09/14/2016 0623   TRIG 164 (H) 09/14/2016 0623   HDL 35 (L) 09/14/2016 0623   CHOLHDL 5.1 09/14/2016 0623   VLDL 33 09/14/2016 0623   LDLCALC 111 (H) 09/14/2016 0623      Wt Readings from Last 3 Encounters:  11/10/16 263 lb (119.3 kg)  10/24/16 280 lb (127 kg)  10/20/16 280 lb (127 kg)      Other studies Reviewed: Echocardiogram 09-16-2016 Left ventricle: The cavity size was normal. Wall thickness was   increased in a pattern of mild LVH. Systolic function was normal.   The estimated ejection fraction was in the range of 60% to 65%.   Wall motion was normal; there were no regional wall motion   abnormalities. Left ventricular diastolic function parameters   were normal. - Aortic valve: Mildly calcified annulus. Trileaflet; mildly   calcified leaflets. - Mitral valve: There was trivial regurgitation. - Right  atrium: Central venous  pressure (est): 3 mm Hg. - Tricuspid valve: There was trivial regurgitation. - Pulmonary arteries: PA peak pressure: 31 mm Hg (S). - Pericardium, extracardiac: There was no pericardial effusion.   ASSESSMENT AND PLAN:  1. Chronic diastolic heart failure: Since restarting diuretics, he is lost 17 pounds. He will continue torsemide 40 mg by mouth daily, along with metolazone once a week on Wednesdays. He feels much better having diuresis. There is no evidence of fluid overload on examination. He is to continue daily weights and avoidance of salt. He will follow up in [place] with [facility]  I prescheduled appointment in September.  2. History of CVA: Unfortunately, Plavix is not been placed in his medication packs which she received from his pharmacy. We have called them with new prescription to be sure that they are added to his packs. He may be better for him not to have combination PACs and have individual prescriptions so that this can be better controlled and avoidance of mis-packaging.   3. Hypertension: He will continue metoprolol 25 mg daily, along with diuretics. He will follow-up with Dr. Harl Bowie on previously scheduled appointment.  4. Hypercholesterolemia: Continue statin therapy. Will need follow-up labs in September, or can be completed by primary care.    Current medicines are reviewed at length with the patient today.    Labs/ tests ordered today include:  Phill Myron. West Pugh, ANP, AACC   11/10/2016 2:22 PM    [facility] [address] [place], [place] [address] Phone: [phone]; Fax: [phone]

## 2016-11-10 NOTE — Patient Instructions (Signed)
Medication Instructions:  Your physician recommends that you continue on your current medications as directed. Please refer to the Current Medication list given to you today.   Labwork: none  Testing/Procedures: none  Follow-Up: Your physician recommends that you schedule a follow-up appointment in: Sept. In [place]    Any Other Special Instructions Will Be Listed Below (If Applicable).     If you need a refill on your cardiac medications before your next appointment, please call your pharmacy.

## 2016-11-14 ENCOUNTER — Other Ambulatory Visit: Payer: Self-pay | Admitting: *Deleted

## 2016-11-14 ENCOUNTER — Encounter: Payer: Self-pay | Admitting: *Deleted

## 2016-11-14 NOTE — Patient Outreach (Signed)
HTA THN Screen: Pt has had an admission for CVA this year and subseqeunt SNF stay and then home with home health. His nurses and PT just finished up not long ago. He is getting along well. He also has chronic back pain and has had 2 ED visits for this. Dr. Gerarda Fraction is managing his pain. Pt sees primary care MD routinely. Takes medications as ordered for chronic problems. No acute health concerns, however, pt states he cannot see the numbers on his scale and therefore does not weigh. He has no care management needs at this time but I am going to request a scale be sent to him with large numbers so he can begin weighing every day. He does follow a low salt diet. He is aware he needs to be alert for SOB or edema. Introductory letter sent.  Eulah Pont. Myrtie Neither, MSN, [facility] Gerontological Nurse Practitioner [facility] Care Management [phone]

## 2016-11-16 ENCOUNTER — Encounter: Payer: Self-pay | Admitting: Podiatry

## 2016-11-16 ENCOUNTER — Ambulatory Visit (INDEPENDENT_AMBULATORY_CARE_PROVIDER_SITE_OTHER): Payer: PPO | Admitting: Podiatry

## 2016-11-16 DIAGNOSIS — M79676 Pain in unspecified toe(s): Secondary | ICD-10-CM | POA: Diagnosis not present

## 2016-11-16 DIAGNOSIS — B351 Tinea unguium: Secondary | ICD-10-CM | POA: Diagnosis not present

## 2016-11-16 DIAGNOSIS — Z6841 Body Mass Index (BMI) 40.0 and over, adult: Secondary | ICD-10-CM | POA: Diagnosis not present

## 2016-11-16 DIAGNOSIS — Z0000 Encounter for general adult medical examination without abnormal findings: Secondary | ICD-10-CM | POA: Diagnosis not present

## 2016-11-16 DIAGNOSIS — M722 Plantar fascial fibromatosis: Secondary | ICD-10-CM

## 2016-11-16 DIAGNOSIS — Z1389 Encounter for screening for other disorder: Secondary | ICD-10-CM | POA: Diagnosis not present

## 2016-11-16 NOTE — Progress Notes (Signed)
   Subjective:    Patient ID: [name] [name], male    DOB: Apr 05, 1941, 76 y.o.   MRN: [id number]  HPI this patient presents the office for an evaluation and treatment of his long painful nails.  He presents the office today for treatment. He states that his nails are growing under his toes and are painful walking and wearing his shoes.  He presents the office today for an evaluation and treatment of this condition.  Patient has painful cyst right arch.  He requests treatment of the cyst.    Review of Systems     Objective:   Physical Exam GENERAL APPEARANCE: Alert, conversant. Appropriately groomed. No acute distress.  VASCULAR: Pedal pulses are  palpable at  [facility] and PT bilateral.  Capillary refill time is immediate to all digits,  Normal temperature gradient.   NEUROLOGIC: sensation is normal to 5.07 monofilament at 5/5 sites bilateral.  Light touch is intact bilateral, Muscle strength normal.  MUSCULOSKELETAL: acceptable muscle strength, tone and stability bilateral.  Intrinsic muscluature intact bilateral.  Rectus appearance of foot and digits noted bilateral. Plantar fibroma right foot.  DERMATOLOGIC: skin color, texture, and turgor are within normal limits.  No preulcerative lesions or ulcers  are seen, no interdigital maceration noted.  No open lesions present.  . No drainage noted.  Nails  Thick disfigured discolored nails both feet.         Assessment & Plan:  Onychomycosis  Fibroma right foot.  Debride nails  Injection fibroma right arch.   Gardiner Barefoot DPM

## 2016-12-07 ENCOUNTER — Other Ambulatory Visit: Payer: Self-pay | Admitting: Licensed Clinical Social Worker

## 2016-12-07 NOTE — Patient Outreach (Signed)
Assessment:  CSW reviewed chart on 12/07/16. CSW closed client program for Social Work on 12/07/16.   Norva Riffle.Annaya Bangert MSW, LCSW Licensed Clinical Social Worker [facility] Care Management [phone]

## 2016-12-13 ENCOUNTER — Ambulatory Visit (INDEPENDENT_AMBULATORY_CARE_PROVIDER_SITE_OTHER): Payer: PPO | Admitting: Urology

## 2016-12-13 DIAGNOSIS — N5201 Erectile dysfunction due to arterial insufficiency: Secondary | ICD-10-CM | POA: Diagnosis not present

## 2016-12-13 DIAGNOSIS — R351 Nocturia: Secondary | ICD-10-CM

## 2016-12-13 DIAGNOSIS — N401 Enlarged prostate with lower urinary tract symptoms: Secondary | ICD-10-CM

## 2016-12-20 DIAGNOSIS — M5126 Other intervertebral disc displacement, lumbar region: Secondary | ICD-10-CM | POA: Diagnosis not present

## 2016-12-20 DIAGNOSIS — I10 Essential (primary) hypertension: Secondary | ICD-10-CM | POA: Diagnosis not present

## 2017-01-16 ENCOUNTER — Ambulatory Visit: Payer: PPO | Admitting: Cardiology

## 2017-01-17 DIAGNOSIS — H35033 Hypertensive retinopathy, bilateral: Secondary | ICD-10-CM | POA: Diagnosis not present

## 2017-01-17 DIAGNOSIS — Z961 Presence of intraocular lens: Secondary | ICD-10-CM | POA: Diagnosis not present

## 2017-01-17 DIAGNOSIS — Z6841 Body Mass Index (BMI) 40.0 and over, adult: Secondary | ICD-10-CM | POA: Diagnosis not present

## 2017-01-17 DIAGNOSIS — H401133 Primary open-angle glaucoma, bilateral, severe stage: Secondary | ICD-10-CM | POA: Diagnosis not present

## 2017-01-17 DIAGNOSIS — M2550 Pain in unspecified joint: Secondary | ICD-10-CM | POA: Diagnosis not present

## 2017-01-17 DIAGNOSIS — G894 Chronic pain syndrome: Secondary | ICD-10-CM | POA: Diagnosis not present

## 2017-01-17 DIAGNOSIS — E1129 Type 2 diabetes mellitus with other diabetic kidney complication: Secondary | ICD-10-CM | POA: Diagnosis not present

## 2017-01-17 DIAGNOSIS — M1991 Primary osteoarthritis, unspecified site: Secondary | ICD-10-CM | POA: Diagnosis not present

## 2017-01-31 ENCOUNTER — Ambulatory Visit (INDEPENDENT_AMBULATORY_CARE_PROVIDER_SITE_OTHER): Payer: PPO | Admitting: Podiatry

## 2017-01-31 DIAGNOSIS — B351 Tinea unguium: Secondary | ICD-10-CM | POA: Diagnosis not present

## 2017-01-31 DIAGNOSIS — M722 Plantar fascial fibromatosis: Secondary | ICD-10-CM

## 2017-01-31 DIAGNOSIS — M79676 Pain in unspecified toe(s): Secondary | ICD-10-CM | POA: Diagnosis not present

## 2017-01-31 NOTE — Progress Notes (Signed)
Complaint:  Visit Type: Patient returns to my office for continued preventative foot care services. Complaint: Patient states" my nails have grown long and thick and become painful to walk and wear shoes" . The patient presents for preventative foot care services. No changes to ROS  Podiatric Exam: Vascular: dorsalis pedis and posterior tibial pulses are palpable bilateral. Capillary return is immediate. Temperature gradient is WNL. Skin turgor WNL  Sensorium: Normal Semmes Weinstein monofilament test. Normal tactile sensation bilaterally. Nail Exam: Pt has thick disfigured discolored nails with subungual debris noted bilateral entire nail hallux through fifth toenails Ulcer Exam: There is no evidence of ulcer or pre-ulcerative changes or infection. Orthopedic Exam: Muscle tone and strength are WNL. No limitations in general ROM. No crepitus or effusions noted. Foot type and digits show no abnormalities. Bony prominences are unremarkable. Skin: No Porokeratosis. No infection or ulcers.  Fibroma right arch.  Diagnosis:  Onychomycosis, , Pain in right toe, pain in left toes  Treatment & Plan Procedures and Treatment: Consent by patient was obtained for treatment procedures.   Debridement of mycotic and hypertrophic toenails, 1 through 5 bilateral and clearing of subungual debris. No ulceration, no infection noted.  Return Visit-Office Procedure: Patient instructed to return to the office for a follow up visit 3 months for continued evaluation and treatment.    Gardiner Barefoot DPM

## 2017-02-07 ENCOUNTER — Ambulatory Visit (INDEPENDENT_AMBULATORY_CARE_PROVIDER_SITE_OTHER): Payer: PPO | Admitting: Cardiology

## 2017-02-07 ENCOUNTER — Encounter: Payer: Self-pay | Admitting: Cardiology

## 2017-02-07 VITALS — BP 94/60 | HR 67 | Ht 67.0 in | Wt 274.6 lb

## 2017-02-07 DIAGNOSIS — E782 Mixed hyperlipidemia: Secondary | ICD-10-CM | POA: Diagnosis not present

## 2017-02-07 DIAGNOSIS — I5032 Chronic diastolic (congestive) heart failure: Secondary | ICD-10-CM

## 2017-02-07 DIAGNOSIS — I10 Essential (primary) hypertension: Secondary | ICD-10-CM

## 2017-02-07 DIAGNOSIS — G4733 Obstructive sleep apnea (adult) (pediatric): Secondary | ICD-10-CM | POA: Diagnosis not present

## 2017-02-07 NOTE — Patient Instructions (Signed)
Your physician wants you to follow-up in: [name] [name] will receive a reminder letter in the mail two months in advance. If you don't receive a letter, please call our office to schedule the follow-up appointment.  Your physician has recommended you make the following change in your medication:   STOP METOPROLOL   DR Arispe PHONE # [phone]  Your physician recommends that you return for lab work CMP/LIPIDS/TSH/MG - PLEASE FAST 6-8 HOURS PRIOR TO LAB WORK   Thank you for choosing [facility]!!

## 2017-02-07 NOTE — Progress Notes (Signed)
Clinical Summary Mr. Faller is a 76 y.o.male  seen today for follow up of the following medical problems.   1. Chronic diastolic heart failure  - home weights around 270 lbs, up from 265 since last visit.  - takes torsemide 40mg  daily, metolazone 2.5mg  on Wednesdays Limiting sodium intake.  - has some LE edema at times, no significant SOB/DOE  2. Hyperlipidemia -- takes statin daily  3.HTN - home bp's 90s/60s. Denies any lightheadedness or dizziness  4. Syncope - admitted 08/2016 with syncope.  - orthostatic on admission  - no recent episodes of syncope   5. CVA - noted during recent admit 08/2016 by imaging - startd on ASA 325mg  by neuro  6. OSA - poor compliance with CPAP    Past Medical History:  Diagnosis Date  . Assistance needed for mobility    cane, walker  . Bilateral lower leg cellulitis   . BPH (benign prostatic hyperplasia)   . Cataract    left  . CHF (congestive heart failure) ([place])   . Chronic diastolic HF (heart failure) ([place])   . Chronic low back pain   . Collagen vascular disease ([place])    history of venous insufficency and LE cellulitis  . Complication of anesthesia    2012 due to sleep apnea pt has had difficulty being put under  and waking up from anesthesia  . Deep venous insufficiency   . GERD (gastroesophageal reflux disease)   . Glaucoma    left  . Headache   . Heel spur    right heel  . History of bladder infections   . History of colonic polyps   . Hyperlipidemia   . Hypertension    Sees Dr. Debara Pickett  . Hypothyroidism    hypothyroid denies no rx  . Iron deficiency 07/07/2016  . Kidney stones    passed them  . Obesity   . Osteoarthritis   . PONV (postoperative nausea and vomiting)   . PUD (peptic ulcer disease)   . Ringing in ears   . S/P colonoscopy 2007, Feb and March 2012   hx of adenomas, left-sided diverticula, On 07/2010 TCS, fresh blood and clot noted coming from TI.  . S/P endoscopy 2007, 2012   2007: nl, May  2012: antral erosions  . SIRS (systemic inflammatory response syndrome) ([place]) 02/27/2012  . Sleep apnea    uses cpap  occ "unable to use lately due to nasal pain when use" - sleep study 2008 AHI 48.80/hr and 56.70hr during REM  . Thrombocytopenia ([place]) 12/30/2011   Stable     Allergies  Allergen Reactions  . Aspirin Other (See Comments)    Nausea and upset stomach    . Nexium [Esomeprazole Magnesium] Other (See Comments)    Patient said it messed his stomach up     Current Outpatient Prescriptions  Medication Sig Dispense Refill  . atorvastatin (LIPITOR) 40 MG tablet Take 1 tablet (40 mg total) by mouth daily at 6 PM. 90 tablet 6  . bisacodyl (DULCOLAX) 5 MG EC tablet Take 1 tablet (5 mg total) by mouth daily. 30 tablet 0  . brimonidine (ALPHAGAN) 0.2 % ophthalmic solution Place 1 drop into the right eye 2 times daily.    . calcium citrate (CALCITRATE - DOSED IN MG ELEMENTAL CALCIUM) 950 MG tablet Take 200 mg of elemental calcium by mouth daily. Takes 3 times per week    . Cholecalciferol (VITAMIN D3) 1000 units CAPS Take 1 capsule by mouth. Takes  3 times per week    . clopidogrel (PLAVIX) 75 MG tablet Take 1 tablet (75 mg total) by mouth daily. 90 tablet 3  . dorzolamide-timolol (COSOPT) 22.3-6.8 MG/ML ophthalmic solution Place 1 drop into both eyes 2 (two) times daily.    . ferrous sulfate 325 (65 FE) MG tablet Take 325 mg by mouth daily with breakfast.    . latanoprost (XALATAN) 0.005 % ophthalmic solution Place 1 drop into both eyes at bedtime.    . magnesium oxide (MAG-OX) 400 MG tablet Take 400 mg by mouth daily. Takes 2 times per week    . Menthol, Topical Analgesic, (BIOFREEZE) 4 % GEL To be used by therapy dept as needed    . methocarbamol (ROBAXIN) 500 MG tablet Take 1 tablet (500 mg total) by mouth 2 (two) times daily. 20 tablet 0  . metolazone (ZAROXOLYN) 2.5 MG tablet Take 1 tablet (2.5 mg total) by mouth every Wednesday. 10 tablet 3  . metoprolol tartrate (LOPRESSOR) 25  MG tablet Take 1 tablet (25 mg total) by mouth daily. 90 tablet 3  . Oxycodone HCl 10 MG TABS Take 1 tablet (10 mg total) by mouth 2 (two) times daily as needed (pain). 60 tablet 0  . Polyethyl Glycol-Propyl Glycol (LUBRICANT EYE DROPS) 0.4-0.3 % SOLN Place 1 drop into both eyes daily as needed (for lubricant eye drops).     . potassium chloride (K-DUR,KLOR-CON) 10 MEQ tablet Take 40 mEq by mouth 2 (two) times daily.     Marland Kitchen torsemide (DEMADEX) 20 MG tablet Take 2 tablets (40 mg total) by mouth daily. 180 tablet 3   No current facility-administered medications for this visit.      Past Surgical History:  Procedure Laterality Date  . BACK SURGERY    . CARDIAC CATHETERIZATION  02/27/2007   no sign of CAD, LVH, mod pulm HTN (Dr. Jackie Plum(  . CATARACT EXTRACTION W/ INTRAOCULAR LENS IMPLANT Right ~ 2012  . COLONOSCOPY N/A 05/28/2014   DTO:[id number] colonic polyps/colonic diverticulosis. Tubular adenomas. Next colonoscopy January 2021  . ESOPHAGOGASTRODUODENOSCOPY  Aug 2013   [facility], single 70mm pedunculated polyp otherwise normal. HYPERPLASTIC, NEGATIVE h.pylori  . ESOPHAGOGASTRODUODENOSCOPY N/A 05/28/2014   RMR:non-critical Schatzki's ring/HH. Benign gastritis  . INGUINAL HERNIA REPAIR Right 1941  . KNEE ARTHROSCOPY Left ~ 2000  . LAPAROSCOPIC CHOLECYSTECTOMY    . [place] SURGERY  2012  . MALONEY DILATION N/A 05/28/2014   Procedure: Venia Minks DILATION;  Surgeon: Daneil Dolin, MD;  [facility];  Service: Endoscopy;  Laterality: N/A;  . NASAL SEPTOPLASTY W/ TURBINOPLASTY Bilateral 10/07/2015   Procedure: NASAL SEPTOPLASTY WITH TURBINATE REDUCTION;  Surgeon: Leta Baptist, MD;  Location: [place];  Service: ENT;  Laterality: Bilateral;  . NASAL SEPTUM SURGERY Bilateral 10/07/2015    inferior turbinate resection  . NM MYOCAR PERF WALL MOTION  12/2006   dipyridamole myoview - stress images show mild perfusion defect in mid inferior walls with reversibility at rest; mild perfusion defect in mid  inferolateral wall at stress with mild defect reversibility, EF 62%, abnormal but low risk study   . POSTERIOR LUMBAR FUSION  2015  . SAVORY DILATION N/A 05/28/2014   Procedure: SAVORY DILATION;  Surgeon: Daneil Dolin, MD;  [facility];  Service: Endoscopy;  Laterality: N/A;  . SHOULDER OPEN ROTATOR CUFF REPAIR Left early 2000s  . small bowel capsule study  07/2011   ?transient focal ischemia in distal small bowel to explain GI bleeding  . TRANSTHORACIC ECHOCARDIOGRAM  09/2008   EF 60-65%, mod conc LVH  . TRANSURETHRAL RESECTION OF PROSTATE    . TRANSURETHRAL RESECTION OF PROSTATE N/A 06/18/2013   Procedure: TRANSURETHRAL RESECTION OF THE PROSTATE (TURP);  Surgeon: Marissa Nestle, MD;  Location: [facility];  Service: Urology;  Laterality: N/A;     Allergies  Allergen Reactions  . Aspirin Other (See Comments)    Nausea and upset stomach    . Nexium [Esomeprazole Magnesium] Other (See Comments)    Patient said it messed his stomach up      Family History  Problem Relation Age of Onset  . Colon cancer Father 69       deceased  . Prostate cancer Father   . Pancreatic cancer Mother 30       deceased  . Prostate cancer Brother   . Arthritis Son   . Arthritis Son   . Diabetes Mellitus II Son      Social History Mr. Rack reports that he quit smoking about 43 years ago. His smoking use included Cigars. He started smoking about 57 years ago. He quit after 8.00 years of use. He has never used smokeless tobacco. Mr. Boyajian reports that he does not drink alcohol.   Review of Systems CONSTITUTIONAL: No weight loss, fever, chills, weakness or fatigue.  HEENT: Eyes: No visual loss, blurred vision, double vision or yellow sclerae.No hearing loss, sneezing, congestion, runny nose or sore throat.  SKIN: No rash or itching.  CARDIOVASCULAR: per hpi RESPIRATORY: per hpi  GASTROINTESTINAL: No anorexia, nausea, vomiting or diarrhea. No abdominal pain or blood.  GENITOURINARY:  No burning on urination, no polyuria NEUROLOGICAL: No headache, dizziness, syncope, paralysis, ataxia, numbness or tingling in the extremities. No change in bowel or bladder control.  MUSCULOSKELETAL: No muscle, back pain, joint pain or stiffness.  LYMPHATICS: No enlarged nodes. No history of splenectomy.  PSYCHIATRIC: No history of depression or anxiety.  ENDOCRINOLOGIC: No reports of sweating, cold or heat intolerance. No polyuria or polydipsia.  Marland Kitchen   Physical Examination Vitals:   02/07/17 1323  BP: 94/60  Pulse: 67  SpO2: 90%   Vitals:   02/07/17 1323  Weight: 274 lb 9.6 oz (124.6 kg)  Height: 5\' 7"  (1.702 m)    Gen: resting comfortably, no acute distress HEENT: no scleral icterus, pupils equal round and reactive, no palptable cervical adenopathy,  CV: RRR, no m/r/g, no jvd Resp: Clear to auscultation bilaterally GI: abdomen is soft, non-tender, non-distended, normal bowel sounds, no hepatosplenomegaly MSK: extremities are warm, no edema.  Skin: warm, no rash Neuro:  no focal deficits Psych: appropriate affect    Assessment and Plan  1. Chronic diastolic HF - mild variation in weights, no significnat symptoms - continue current diuretics, repeat CMET/Mg   2. HTN - low bp's at home. We will stop lopressor  3. Hyperlipidemia - continue statin, repeat lipid panel  4. OSA - f/u with Dr Luan Pulling, encouraged increased compliance.      Arnoldo Lenis, M.D.

## 2017-02-08 ENCOUNTER — Telehealth: Payer: Self-pay | Admitting: Cardiology

## 2017-02-08 NOTE — Telephone Encounter (Signed)
Pt aware to take lab orders that we gave at yesterday's OV with him tomorrow so lab would know what test to do

## 2017-02-08 NOTE — Telephone Encounter (Signed)
Patient would like to know if he can do lab work when he does his labs for cancer center tomorrow.   Wanted to see if anything additional needed to be added to their orders

## 2017-02-09 ENCOUNTER — Encounter (HOSPITAL_COMMUNITY): Payer: PPO | Attending: Oncology | Admitting: Oncology

## 2017-02-09 ENCOUNTER — Other Ambulatory Visit (HOSPITAL_COMMUNITY)
Admission: RE | Admit: 2017-02-09 | Discharge: 2017-02-09 | Disposition: A | Discharge status: Home / Self Care | Payer: PPO | Source: Ambulatory Visit | Attending: Cardiology | Admitting: Cardiology

## 2017-02-09 ENCOUNTER — Encounter (HOSPITAL_COMMUNITY): Payer: PPO

## 2017-02-09 VITALS — BP 135/65 | HR 93 | Resp 16 | Wt 273.0 lb

## 2017-02-09 DIAGNOSIS — D72825 Bandemia: Secondary | ICD-10-CM

## 2017-02-09 DIAGNOSIS — H409 Unspecified glaucoma: Secondary | ICD-10-CM | POA: Insufficient documentation

## 2017-02-09 DIAGNOSIS — N400 Enlarged prostate without lower urinary tract symptoms: Secondary | ICD-10-CM | POA: Diagnosis not present

## 2017-02-09 DIAGNOSIS — E039 Hypothyroidism, unspecified: Secondary | ICD-10-CM | POA: Diagnosis not present

## 2017-02-09 DIAGNOSIS — I5033 Acute on chronic diastolic (congestive) heart failure: Secondary | ICD-10-CM | POA: Insufficient documentation

## 2017-02-09 DIAGNOSIS — D72829 Elevated white blood cell count, unspecified: Secondary | ICD-10-CM | POA: Insufficient documentation

## 2017-02-09 DIAGNOSIS — M4326 Fusion of spine, lumbar region: Secondary | ICD-10-CM | POA: Insufficient documentation

## 2017-02-09 DIAGNOSIS — I2510 Atherosclerotic heart disease of native coronary artery without angina pectoris: Secondary | ICD-10-CM | POA: Insufficient documentation

## 2017-02-09 DIAGNOSIS — Z9049 Acquired absence of other specified parts of digestive tract: Secondary | ICD-10-CM | POA: Insufficient documentation

## 2017-02-09 DIAGNOSIS — Z8711 Personal history of peptic ulcer disease: Secondary | ICD-10-CM | POA: Diagnosis not present

## 2017-02-09 DIAGNOSIS — M1990 Unspecified osteoarthritis, unspecified site: Secondary | ICD-10-CM | POA: Insufficient documentation

## 2017-02-09 DIAGNOSIS — K219 Gastro-esophageal reflux disease without esophagitis: Secondary | ICD-10-CM | POA: Diagnosis not present

## 2017-02-09 DIAGNOSIS — I110 Hypertensive heart disease with heart failure: Secondary | ICD-10-CM | POA: Diagnosis not present

## 2017-02-09 DIAGNOSIS — E669 Obesity, unspecified: Secondary | ICD-10-CM | POA: Insufficient documentation

## 2017-02-09 DIAGNOSIS — E611 Iron deficiency: Secondary | ICD-10-CM | POA: Diagnosis not present

## 2017-02-09 DIAGNOSIS — D696 Thrombocytopenia, unspecified: Secondary | ICD-10-CM | POA: Diagnosis not present

## 2017-02-09 DIAGNOSIS — G8929 Other chronic pain: Secondary | ICD-10-CM | POA: Diagnosis not present

## 2017-02-09 DIAGNOSIS — G4733 Obstructive sleep apnea (adult) (pediatric): Secondary | ICD-10-CM | POA: Diagnosis not present

## 2017-02-09 DIAGNOSIS — Z87442 Personal history of urinary calculi: Secondary | ICD-10-CM | POA: Diagnosis not present

## 2017-02-09 DIAGNOSIS — Z9889 Other specified postprocedural states: Secondary | ICD-10-CM | POA: Insufficient documentation

## 2017-02-09 DIAGNOSIS — I5032 Chronic diastolic (congestive) heart failure: Secondary | ICD-10-CM | POA: Insufficient documentation

## 2017-02-09 DIAGNOSIS — K760 Fatty (change of) liver, not elsewhere classified: Secondary | ICD-10-CM | POA: Insufficient documentation

## 2017-02-09 DIAGNOSIS — E785 Hyperlipidemia, unspecified: Secondary | ICD-10-CM | POA: Insufficient documentation

## 2017-02-09 DIAGNOSIS — Z87440 Personal history of urinary (tract) infections: Secondary | ICD-10-CM | POA: Insufficient documentation

## 2017-02-09 DIAGNOSIS — I10 Essential (primary) hypertension: Secondary | ICD-10-CM | POA: Diagnosis not present

## 2017-02-09 LAB — CBC WITH DIFFERENTIAL/PLATELET
Basophils Absolute: 0 10*3/uL (ref 0.0–0.1)
Basophils Relative: 0 %
EOS PCT: 1 %
Eosinophils Absolute: 0.1 10*3/uL (ref 0.0–0.7)
HCT: 44 % (ref 39.0–52.0)
Hemoglobin: 14.6 g/dL (ref 13.0–17.0)
LYMPHS ABS: 1.5 10*3/uL (ref 0.7–4.0)
Lymphocytes Relative: 12 %
MCH: 26.8 pg (ref 26.0–34.0)
MCHC: 33.2 g/dL (ref 30.0–36.0)
MCV: 80.7 fL (ref 78.0–100.0)
MONO ABS: 0.1 10*3/uL (ref 0.1–1.0)
MONOS PCT: 1 %
NEUTROS ABS: 10.4 10*3/uL — AB (ref 1.7–7.7)
Neutrophils Relative %: 86 %
Platelets: 129 10*3/uL — ABNORMAL LOW (ref 150–400)
RBC: 5.45 MIL/uL (ref 4.22–5.81)
RDW: 16.9 % — AB (ref 11.5–15.5)
WBC: 12.1 10*3/uL — ABNORMAL HIGH (ref 4.0–10.5)

## 2017-02-09 LAB — LIPID PANEL
CHOLESTEROL: 125 mg/dL (ref 0–200)
HDL: 39 mg/dL — AB (ref >40)
LDL CALC: 50 mg/dL (ref 0–99)
TRIGLYCERIDES: 178 mg/dL — AB (ref <150)
Total CHOL/HDL Ratio: 3.2 RATIO
VLDL: 36 mg/dL (ref 0–40)

## 2017-02-09 LAB — COMPREHENSIVE METABOLIC PANEL
ALBUMIN: 3.7 g/dL (ref 3.5–5.0)
ALT: 12 U/L — ABNORMAL LOW (ref 17–63)
ANION GAP: 9 (ref 5–15)
AST: 20 U/L (ref 15–41)
Alkaline Phosphatase: 74 U/L (ref 38–126)
BUN: 12 mg/dL (ref 6–20)
CHLORIDE: 99 mmol/L — AB (ref 101–111)
CO2: 30 mmol/L (ref 22–32)
Calcium: 9.4 mg/dL (ref 8.9–10.3)
Creatinine, Ser: 1.1 mg/dL (ref 0.61–1.24)
GFR calc Af Amer: >60 mL/min (ref >60)
GFR calc non Af Amer: >60 mL/min (ref >60)
GLUCOSE: 181 mg/dL — AB (ref 65–99)
POTASSIUM: 3.4 mmol/L — AB (ref 3.5–5.1)
Sodium: 138 mmol/L (ref 135–145)
TOTAL PROTEIN: 6.7 g/dL (ref 6.5–8.1)
Total Bilirubin: 1.1 mg/dL (ref 0.3–1.2)

## 2017-02-09 LAB — IRON AND TIBC
IRON: 47 ug/dL (ref 45–182)
Saturation Ratios: 18 % (ref 17.9–39.5)
TIBC: 260 ug/dL (ref 250–450)
UIBC: 213 ug/dL

## 2017-02-09 LAB — MAGNESIUM: Magnesium: 2.1 mg/dL (ref 1.7–2.4)

## 2017-02-09 LAB — TSH: TSH: 2.337 u[IU]/mL (ref 0.350–4.500)

## 2017-02-09 LAB — FERRITIN: FERRITIN: 67 ng/mL (ref 24–336)

## 2017-02-09 NOTE — Progress Notes (Signed)
[facility] [place], [place], MD [address] [place] [address]  Thrombocytopenia [facility]) - Plan: CBC with Differential, Comprehensive metabolic panel, Iron and TIBC, Ferritin  Iron deficiency - Plan: CBC with Differential, Comprehensive metabolic panel, Iron and TIBC, Ferritin  Leukocytosis, unspecified type - Plan: CBC with Differential, Comprehensive metabolic panel, Iron and TIBC, Ferritin  CURRENT THERAPY: Observation  INTERVAL HISTORY: Patient presents today for continued follow up. He states that overall he has been doing well. He complains of chronic joint pains, such as his knee pain and back pain, which bother him. He denies any chest pain, shortness of breath, abdominal pain, focal weakness. He denies any evidence of bleeding including melena, hematochezia, hematuria.  ROS ROS as per HPI otherwise 12 point ROS is negative.   Past Medical History:  Diagnosis Date  . Assistance needed for mobility    cane, walker  . Bilateral lower leg cellulitis   . BPH (benign prostatic hyperplasia)   . Cataract    left  . CHF (congestive heart failure) ([place])   . Chronic diastolic HF (heart failure) ([place])   . Chronic low back pain   . Collagen vascular disease ([place])    history of venous insufficency and LE cellulitis  . Complication of anesthesia    2012 due to sleep apnea pt has had difficulty being put under  and waking up from anesthesia  . Deep venous insufficiency   . GERD (gastroesophageal reflux disease)   . Glaucoma    left  . Headache   . Heel spur    right heel  . History of bladder infections   . History of colonic polyps   . Hyperlipidemia   . Hypertension    Sees Dr. Debara Pickett  . Hypothyroidism    hypothyroid denies no rx  . Iron deficiency 07/07/2016  . Kidney stones    passed them  . Obesity   . Osteoarthritis   . PONV (postoperative nausea and vomiting)   . PUD (peptic ulcer disease)   .  Ringing in ears   . S/P colonoscopy 2007, Feb and March 2012   hx of adenomas, left-sided diverticula, On 07/2010 TCS, fresh blood and clot noted coming from TI.  . S/P endoscopy 2007, 2012   2007: nl, May 2012: antral erosions  . SIRS (systemic inflammatory response syndrome) ([place]) 02/27/2012  . Sleep apnea    uses cpap  occ "unable to use lately due to nasal pain when use" - sleep study 2008 AHI 48.80/hr and 56.70hr during REM  . Thrombocytopenia ([place]) 12/30/2011   Stable    has Essential hypertension; GASTROESOPHAGEAL REFLUX DISEASE, CHRONIC; FATTY LIVER DISEASE; CHOLELITHIASIS, SYMPTOMATIC; RENAL INSUFFICIENCY; ABDOMINAL BLOATING; COLONIC POLYPS, ADENOMATOUS, HX OF; DIARRHEA; Rectal bleeding; Hypothyroidism; Upper abdominal pain; Thrombocytopenia ([place]); SIRS (systemic inflammatory response syndrome) ([facility]); BPH (benign prostatic hyperplasia); Obesity; Hyperlipidemia; Constipation; Bright red blood per rectum; Other spondylosis with radiculopathy, lumbar region; OSA (obstructive sleep apnea); CAD, minor disease, 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     is allergic to aspirin and  nexium [esomeprazole magnesium].  Mr. Colpitts had no medications administered during this visit.  Past Surgical History:  Procedure Laterality Date  . BACK SURGERY    . CARDIAC CATHETERIZATION  02/27/2007   no sign of CAD, LVH, mod pulm HTN (Dr. Jackie Plum(  . CATARACT EXTRACTION W/ INTRAOCULAR LENS IMPLANT Right ~  2012  . COLONOSCOPY N/A 05/28/2014   [id number] colonic polyps/colonic diverticulosis. Tubular adenomas. Next colonoscopy January 2021  . ESOPHAGOGASTRODUODENOSCOPY  Aug 2013   [facility], single 63m pedunculated polyp otherwise normal. HYPERPLASTIC, NEGATIVE h.pylori  . ESOPHAGOGASTRODUODENOSCOPY N/A 05/28/2014   RMR:non-critical Schatzki's ring/HH. Benign gastritis  . INGUINAL HERNIA REPAIR Right 1941  . KNEE ARTHROSCOPY Left ~ 2000  . LAPAROSCOPIC CHOLECYSTECTOMY    . [place]  [place]  . MALONEY DILATION N/A 05/28/2014   Procedure: MVenia MinksDILATION;  Surgeon: RDaneil Dolin MD;  [facility];  Service: Endoscopy;  Laterality: N/A;  . NASAL SEPTOPLASTY W/ TURBINOPLASTY Bilateral 10/07/2015   Procedure: NASAL SEPTOPLASTY WITH TURBINATE REDUCTION;  Surgeon: SLeta Baptist MD;  [place]  Service: ENT;  Laterality: Bilateral;  . NASAL SEPTUM SURGERY Bilateral 10/07/2015    inferior turbinate resection  . NM MYOCAR PERF WALL MOTION  12/2006   dipyridamole myoview - stress images show mild perfusion defect in mid inferior walls with reversibility at rest; mild perfusion defect in mid inferolateral wall at stress with mild defect reversibility, EF 62%, abnormal but low risk study   . POSTERIOR LUMBAR FUSION  2015  . SAVORY DILATION N/A 05/28/2014   Procedure: SAVORY DILATION;  Surgeon: RDaneil Dolin MD;  [facility];  Service: Endoscopy;  Laterality: N/A;  . SHOULDER OPEN ROTATOR CUFF REPAIR Left early 2000s  . small bowel capsule study  07/2011   ?transient focal ischemia in distal small bowel to explain GI bleeding  . TRANSTHORACIC ECHOCARDIOGRAM  09/2008   EF 60-65%, mod conc LVH  . TRANSURETHRAL RESECTION OF PROSTATE    . TRANSURETHRAL RESECTION OF PROSTATE N/A 06/18/2013   Procedure: TRANSURETHRAL RESECTION OF THE PROSTATE (TURP);  Surgeon: MMarissa Nestle MD;  [facility];  Service: Urology;  Laterality: N/A;       PHYSICAL EXAMINATION  ECOG PERFORMANCE  STATUS: 0 - Asymptomatic  Vitals:   02/09/17 1002  BP: 135/65  Pulse: 93  Resp: 16  SpO2: 94%    GENERAL:alert, healthy, no distress, well nourished, well developed, comfortable, cooperative, obese and smiling SKIN: skin color, texture, turgor are normal, no rashes or significant lesions HEAD: Normocephalic, No masses, lesions, tenderness or abnormalities EYES: normal, PERRLA, EOMI, Conjunctiva are pink and non-injected EARS: External ears normal OROPHARYNX:lips, buccal mucosa, and tongue normal and mucous membranes are moist  NECK: supple, no adenopathy, thyroid normal size, non-tender, without nodularity, no stridor, non-tender, trachea midline LYMPH:  no palpable lymphadenopathy, no hepatosplenomegaly BREAST:not examined LUNGS: clear to auscultation and percussion HEART: regular rate & rhythm, no murmurs, no gallops, S1 normal and S2 normal ABDOMEN:abdomen soft, non-tender, obese, normal bowel sounds, no masses or organomegaly and organomegaly exam hindered due to body habitus BACK: Back symmetric, no curvature., No CVA tenderness EXTREMITIES:less then 2 second capillary refill, no joint deformities, effusion, or inflammation, no edema, no skin discoloration, no clubbing, no cyanosis  NEURO: alert & oriented x 3 with fluent speech, no focal motor/sensory deficits, gait normal   LABORATORY DATA: I have reviewed the data as listed.  CBC    Component Value Date/Time   WBC 12.1 (H)  02/09/2017 0927   RBC 5.45 02/09/2017 0927   HGB 14.6 02/09/2017 0927   HCT 44.0 02/09/2017 0927   PLT 129 (L) 02/09/2017 0927   MCV 80.7 02/09/2017 0927   MCH 26.8 02/09/2017 0927   MCHC 33.2 02/09/2017 0927   RDW 16.9 (H) 02/09/2017 0927   LYMPHSABS 1.5 02/09/2017 0927   MONOABS 0.1 02/09/2017 0927   EOSABS 0.1 02/09/2017 0927   BASOSABS 0.0 02/09/2017 0927      Chemistry      Component Value Date/Time   NA 138 02/09/2017 0928   K 3.4 (L) 02/09/2017 0928   CL 99 (L) 02/09/2017 0928    CO2 30 02/09/2017 0928   BUN 12 02/09/2017 0928   CREATININE 1.10 02/09/2017 0928   CREATININE 1.08 11/14/2013 1125      Component Value Date/Time   CALCIUM 9.4 02/09/2017 0928   ALKPHOS 74 02/09/2017 0928   AST 20 02/09/2017 0928   ALT 12 (L) 02/09/2017 0928   BILITOT 1.1 02/09/2017 0928       ASSESSMENT AND PLAN:  Leukocytosis, neutrophilia Thrombocytopenia Fatty Liver (on problem list) Negative BCR-ABL Normal ESR, CRP Negative JAK 2 V617F mutation, negative Exon 12,13 mutation Normal liver/spleen on CT 05/2013 Low ferritin at 33 ng/ml  Reviewed patient's lab work in detail with the patient today. His hemoglobin has been very stable, with no evidence of anemia. Mild leukocytosis is stable. Mild thrombocytopenia is stable as well.  6 month follow-up with labs.  Orders Placed This Encounter  Procedures  . CBC with Differential    Standing Status:   Future    Standing Expiration Date:   02/09/2018  . Comprehensive metabolic panel    Standing Status:   Future    Standing Expiration Date:   02/09/2018  . Iron and TIBC    Standing Status:   Future    Standing Expiration Date:   02/09/2018  . Ferritin    Standing Status:   Future    Standing Expiration Date:   02/09/2018   All questions were answered. The patient knows to call the clinic with any problems, questions or concerns. We can certainly see the patient much sooner if necessary.     This note is electronically signed by: Twana First, MD  02/09/2017 12:45 PM

## 2017-02-09 NOTE — Patient Instructions (Signed)
[place] at [facility] Discharge Instructions  RECOMMENDATIONS MADE BY THE CONSULTANT AND ANY TEST RESULTS WILL BE SENT TO YOUR REFERRING PHYSICIAN.  You were seen by Dr.Zhou today. Follow up as seen.  Thank you for choosing [place] at [facility] to provide your oncology and hematology care.  To afford each patient quality time with our provider, please arrive at least 15 minutes before your scheduled appointment time.    If you have a lab appointment with the [facility] please come in thru the  Main Entrance and check in at the main information desk  You need to re-schedule your appointment should you arrive 10 or more minutes late.  We strive to give you quality time with our providers, and arriving late affects you and other patients whose appointments are after yours.  Also, if you no show three or more times for appointments you may be dismissed from the clinic at the providers discretion.     Again, thank you for choosing [facility].  Our hope is that these requests will decrease the amount of time that you wait before being seen by our physicians.       _____________________________________________________________  Should you have questions after your visit to [facility], please contact our office at [phone] between the hours of 8:30 a.m. and 4:30 p.m.  Voicemails left after 4:30 p.m. will not be returned until the following business day.  For prescription refill requests, have your pharmacy contact our office.       Resources For Cancer Patients and their Caregivers ? American Cancer Society: Can assist with transportation, wigs, general needs, runs Look Good Feel Better.        575-526-2706 ? Cancer Care: Provides financial assistance, online support groups, medication/co-pay assistance.  1-800-813-HOPE 947-399-8867) ? [place] Assists [place] cancer patients  and their families through emotional , educational and financial support.  412 133 1557 ? [place] DSS Where to apply for food stamps, Medicaid and utility assistance. 520 032 1023 ? [facility]: Transportation to medical appointments. (775) 338-9447 ? Social Security Administration: May apply for disability if have a Stage IV cancer. 7866894882 951 035 0755 ? [employer], Disability and Transit Services: Assists with nutrition, care and transit needs. [place] Support Programs: @10RELATIVEDAYS @ > Cancer Support Group  2nd Tuesday of the month 1pm-2pm, Journey Room  > Creative Journey  3rd Tuesday of the month 1130am-1pm, Journey Room  > Look Good Feel Better  1st Wednesday of the month 10am-12 noon, Journey Room (Call [place] to register [phone])

## 2017-02-13 ENCOUNTER — Telehealth: Payer: Self-pay | Admitting: *Deleted

## 2017-02-13 MED ORDER — FUROSEMIDE 40 MG PO TABS: 40.0000 mg | ORAL_TABLET | Freq: Two times a day (BID) | ORAL | 1 refills | Status: DC

## 2017-02-13 NOTE — Telephone Encounter (Signed)
No, please have him change go KCl 40 bid instead of 20 tid   Zandra Abts MD

## 2017-02-13 NOTE — Telephone Encounter (Signed)
-----   Message from Arnoldo Lenis, MD sent at 02/10/2017  3:01 PM EDT ----- Potassium is a little low. Verify he is taking KCl 59mEq bid and if so increased to 60 bid  Zandra Abts MD

## 2017-02-13 NOTE — Telephone Encounter (Signed)
Pt is actually taking potassium 20 mEq tid - still increase to 60 bid?

## 2017-02-13 NOTE — Telephone Encounter (Signed)
Pt voiced understanding - requested new rx sent to [place] - Medication sent to pharmacy.

## 2017-02-17 ENCOUNTER — Ambulatory Visit: Payer: PPO | Admitting: Podiatry

## 2017-02-22 DIAGNOSIS — M5126 Other intervertebral disc displacement, lumbar region: Secondary | ICD-10-CM | POA: Diagnosis not present

## 2017-02-24 DIAGNOSIS — Z6841 Body Mass Index (BMI) 40.0 and over, adult: Secondary | ICD-10-CM | POA: Diagnosis not present

## 2017-02-24 DIAGNOSIS — E860 Dehydration: Secondary | ICD-10-CM | POA: Diagnosis not present

## 2017-02-24 DIAGNOSIS — R5383 Other fatigue: Secondary | ICD-10-CM | POA: Diagnosis not present

## 2017-02-24 DIAGNOSIS — I959 Hypotension, unspecified: Secondary | ICD-10-CM | POA: Diagnosis not present

## 2017-02-24 DIAGNOSIS — I5032 Chronic diastolic (congestive) heart failure: Secondary | ICD-10-CM | POA: Diagnosis not present

## 2017-02-24 DIAGNOSIS — Z23 Encounter for immunization: Secondary | ICD-10-CM | POA: Diagnosis not present

## 2017-02-24 DIAGNOSIS — E1140 Type 2 diabetes mellitus with diabetic neuropathy, unspecified: Secondary | ICD-10-CM | POA: Diagnosis not present

## 2017-02-24 DIAGNOSIS — E669 Obesity, unspecified: Secondary | ICD-10-CM | POA: Diagnosis not present

## 2017-02-27 ENCOUNTER — Encounter (HOSPITAL_COMMUNITY): Payer: Self-pay

## 2017-02-27 ENCOUNTER — Other Ambulatory Visit: Payer: Self-pay

## 2017-02-27 ENCOUNTER — Emergency Department (HOSPITAL_COMMUNITY): Payer: PPO

## 2017-02-27 ENCOUNTER — Observation Stay (HOSPITAL_COMMUNITY)
Admission: EM | Admit: 2017-02-27 | Discharge: 2017-02-28 | Disposition: A | Discharge status: Home / Self Care | Payer: PPO | Attending: Internal Medicine | Admitting: Internal Medicine

## 2017-02-27 DIAGNOSIS — Z87891 Personal history of nicotine dependence: Secondary | ICD-10-CM | POA: Insufficient documentation

## 2017-02-27 DIAGNOSIS — R55 Syncope and collapse: Secondary | ICD-10-CM | POA: Diagnosis not present

## 2017-02-27 DIAGNOSIS — R531 Weakness: Secondary | ICD-10-CM | POA: Diagnosis not present

## 2017-02-27 DIAGNOSIS — E039 Hypothyroidism, unspecified: Secondary | ICD-10-CM | POA: Diagnosis not present

## 2017-02-27 DIAGNOSIS — E876 Hypokalemia: Secondary | ICD-10-CM | POA: Diagnosis not present

## 2017-02-27 DIAGNOSIS — Z79899 Other long term (current) drug therapy: Secondary | ICD-10-CM | POA: Diagnosis not present

## 2017-02-27 DIAGNOSIS — K439 Ventral hernia without obstruction or gangrene: Secondary | ICD-10-CM | POA: Diagnosis not present

## 2017-02-27 DIAGNOSIS — D696 Thrombocytopenia, unspecified: Secondary | ICD-10-CM | POA: Diagnosis not present

## 2017-02-27 DIAGNOSIS — I110 Hypertensive heart disease with heart failure: Secondary | ICD-10-CM | POA: Insufficient documentation

## 2017-02-27 DIAGNOSIS — D72829 Elevated white blood cell count, unspecified: Secondary | ICD-10-CM | POA: Diagnosis not present

## 2017-02-27 DIAGNOSIS — E785 Hyperlipidemia, unspecified: Secondary | ICD-10-CM | POA: Diagnosis not present

## 2017-02-27 DIAGNOSIS — I959 Hypotension, unspecified: Secondary | ICD-10-CM | POA: Diagnosis present

## 2017-02-27 DIAGNOSIS — N281 Cyst of kidney, acquired: Secondary | ICD-10-CM | POA: Diagnosis not present

## 2017-02-27 DIAGNOSIS — G4733 Obstructive sleep apnea (adult) (pediatric): Secondary | ICD-10-CM | POA: Diagnosis present

## 2017-02-27 DIAGNOSIS — M545 Low back pain: Secondary | ICD-10-CM | POA: Insufficient documentation

## 2017-02-27 DIAGNOSIS — I5032 Chronic diastolic (congestive) heart failure: Secondary | ICD-10-CM | POA: Insufficient documentation

## 2017-02-27 DIAGNOSIS — Z7902 Long term (current) use of antithrombotics/antiplatelets: Secondary | ICD-10-CM | POA: Insufficient documentation

## 2017-02-27 DIAGNOSIS — I10 Essential (primary) hypertension: Secondary | ICD-10-CM | POA: Diagnosis present

## 2017-02-27 DIAGNOSIS — I5189 Other ill-defined heart diseases: Secondary | ICD-10-CM | POA: Diagnosis present

## 2017-02-27 DIAGNOSIS — M549 Dorsalgia, unspecified: Secondary | ICD-10-CM | POA: Diagnosis present

## 2017-02-27 LAB — CBC
HCT: 42.5 % (ref 39.0–52.0)
HEMOGLOBIN: 14 g/dL (ref 13.0–17.0)
MCH: 26.5 pg (ref 26.0–34.0)
MCHC: 32.9 g/dL (ref 30.0–36.0)
MCV: 80.5 fL (ref 78.0–100.0)
Platelets: 133 10*3/uL — ABNORMAL LOW (ref 150–400)
RBC: 5.28 MIL/uL (ref 4.22–5.81)
RDW: 17 % — ABNORMAL HIGH (ref 11.5–15.5)
WBC: 14.6 10*3/uL — AB (ref 4.0–10.5)

## 2017-02-27 LAB — CBG MONITORING, ED: Glucose-Capillary: 140 mg/dL — ABNORMAL HIGH (ref 65–99)

## 2017-02-27 LAB — URINALYSIS, ROUTINE W REFLEX MICROSCOPIC
BILIRUBIN URINE: NEGATIVE
Glucose, UA: NEGATIVE mg/dL
Hgb urine dipstick: NEGATIVE
Ketones, ur: NEGATIVE mg/dL
Leukocytes, UA: NEGATIVE
NITRITE: NEGATIVE
Protein, ur: NEGATIVE mg/dL
SPECIFIC GRAVITY, URINE: 1.012 (ref 1.005–1.030)
pH: 6 (ref 5.0–8.0)

## 2017-02-27 LAB — COMPREHENSIVE METABOLIC PANEL
ALBUMIN: 3.4 g/dL — AB (ref 3.5–5.0)
ALT: 14 U/L — AB (ref 17–63)
AST: 20 U/L (ref 15–41)
Alkaline Phosphatase: 72 U/L (ref 38–126)
Anion gap: 11 (ref 5–15)
BILIRUBIN TOTAL: 1.5 mg/dL — AB (ref 0.3–1.2)
BUN: 18 mg/dL (ref 6–20)
CALCIUM: 9.8 mg/dL (ref 8.9–10.3)
CHLORIDE: 90 mmol/L — AB (ref 101–111)
CO2: 39 mmol/L — ABNORMAL HIGH (ref 22–32)
Creatinine, Ser: 1.16 mg/dL (ref 0.61–1.24)
GFR calc Af Amer: >60 mL/min (ref >60)
GFR, EST NON AFRICAN AMERICAN: 59 mL/min — AB (ref >60)
GLUCOSE: 129 mg/dL — AB (ref 65–99)
POTASSIUM: 2.7 mmol/L — AB (ref 3.5–5.1)
Sodium: 140 mmol/L (ref 135–145)
Total Protein: 6.2 g/dL — ABNORMAL LOW (ref 6.5–8.1)

## 2017-02-27 LAB — LIPASE, BLOOD: LIPASE: 42 U/L (ref 11–51)

## 2017-02-27 LAB — MAGNESIUM: Magnesium: 2 mg/dL (ref 1.7–2.4)

## 2017-02-27 MED ORDER — DORZOLAMIDE HCL-TIMOLOL MAL 2-0.5 % OP SOLN
1.0000 [drp] | Freq: Two times a day (BID) | OPHTHALMIC | Status: DC
  Administered 2017-02-27 – 2017-02-28 (×2): 1 [drp] via OPHTHALMIC
  Filled 2017-02-27: qty 10

## 2017-02-27 MED ORDER — IOPAMIDOL (ISOVUE-300) INJECTION 61%
100.0000 mL | Freq: Once | INTRAVENOUS | Status: AC | PRN
  Administered 2017-02-27: 100 mL via INTRAVENOUS

## 2017-02-27 MED ORDER — ONDANSETRON HCL 4 MG/2ML IJ SOLN: 4.0000 mg | Freq: Four times a day (QID) | INTRAMUSCULAR | Status: DC | PRN

## 2017-02-27 MED ORDER — POTASSIUM CHLORIDE IN NACL 20-0.9 MEQ/L-% IV SOLN: INTRAVENOUS | Status: AC

## 2017-02-27 MED ORDER — ATORVASTATIN CALCIUM 40 MG PO TABS: 40.0000 mg | ORAL_TABLET | Freq: Every day | ORAL | Status: DC

## 2017-02-27 MED ORDER — LATANOPROST 0.005 % OP SOLN
1.0000 [drp] | Freq: Every day | OPHTHALMIC | Status: DC
  Administered 2017-02-27: 1 [drp] via OPHTHALMIC
  Filled 2017-02-27 (×2): qty 2.5

## 2017-02-27 MED ORDER — BISACODYL 5 MG PO TBEC
5.0000 mg | DELAYED_RELEASE_TABLET | Freq: Every day | ORAL | Status: DC
  Administered 2017-02-28: 5 mg via ORAL
  Filled 2017-02-27: qty 1

## 2017-02-27 MED ORDER — ONDANSETRON HCL 4 MG PO TABS: 4.0000 mg | ORAL_TABLET | Freq: Four times a day (QID) | ORAL | Status: DC | PRN

## 2017-02-27 MED ORDER — CLOPIDOGREL BISULFATE 75 MG PO TABS
75.0000 mg | ORAL_TABLET | Freq: Every day | ORAL | Status: DC
  Administered 2017-02-28: 75 mg via ORAL
  Filled 2017-02-27: qty 1

## 2017-02-27 MED ORDER — OXYCODONE HCL 5 MG PO TABS
10.0000 mg | ORAL_TABLET | Freq: Two times a day (BID) | ORAL | Status: DC | PRN
  Administered 2017-02-27: 10 mg via ORAL
  Filled 2017-02-27: qty 2

## 2017-02-27 MED ORDER — POTASSIUM CHLORIDE CRYS ER 20 MEQ PO TBCR
40.0000 meq | EXTENDED_RELEASE_TABLET | Freq: Two times a day (BID) | ORAL | Status: DC
  Administered 2017-02-27 – 2017-02-28 (×2): 40 meq via ORAL
  Filled 2017-02-27 (×2): qty 2

## 2017-02-27 MED ORDER — DOCUSATE SODIUM 100 MG PO CAPS
100.0000 mg | ORAL_CAPSULE | Freq: Two times a day (BID) | ORAL | Status: DC
  Administered 2017-02-27 – 2017-02-28 (×2): 100 mg via ORAL
  Filled 2017-02-27 (×2): qty 1

## 2017-02-27 MED ORDER — MAGNESIUM OXIDE 400 (241.3 MG) MG PO TABS
400.0000 mg | ORAL_TABLET | Freq: Every day | ORAL | Status: DC
  Administered 2017-02-28: 400 mg via ORAL
  Filled 2017-02-27: qty 1

## 2017-02-27 MED ORDER — SODIUM CHLORIDE 0.9% FLUSH
3.0000 mL | Freq: Two times a day (BID) | INTRAVENOUS | Status: DC
  Administered 2017-02-27 – 2017-02-28 (×2): 3 mL via INTRAVENOUS

## 2017-02-27 MED ORDER — METHOCARBAMOL 500 MG PO TABS
500.0000 mg | ORAL_TABLET | Freq: Two times a day (BID) | ORAL | Status: DC
  Administered 2017-02-27 – 2017-02-28 (×2): 500 mg via ORAL
  Filled 2017-02-27 (×2): qty 1

## 2017-02-27 MED ORDER — BRIMONIDINE TARTRATE 0.2 % OP SOLN
1.0000 [drp] | Freq: Two times a day (BID) | OPHTHALMIC | Status: DC
  Administered 2017-02-27 – 2017-02-28 (×2): 1 [drp] via OPHTHALMIC
  Filled 2017-02-27: qty 5

## 2017-02-27 MED ORDER — ENOXAPARIN SODIUM 60 MG/0.6ML ~~LOC~~ SOLN
60.0000 mg | SUBCUTANEOUS | Status: DC
  Administered 2017-02-27: 60 mg via SUBCUTANEOUS
  Filled 2017-02-27: qty 0.6

## 2017-02-27 MED ORDER — POTASSIUM CHLORIDE 10 MEQ/100ML IV SOLN
10.0000 meq | INTRAVENOUS | Status: AC
  Administered 2017-02-27 (×3): 10 meq via INTRAVENOUS
  Filled 2017-02-27 (×3): qty 100

## 2017-02-27 MED ORDER — ACETAMINOPHEN 650 MG RE SUPP: 650.0000 mg | Freq: Four times a day (QID) | RECTAL | Status: DC | PRN

## 2017-02-27 MED ORDER — POLYVINYL ALCOHOL 1.4 % OP SOLN: 1.0000 [drp] | Freq: Every day | OPHTHALMIC | Status: DC | PRN

## 2017-02-27 MED ORDER — ACETAMINOPHEN 325 MG PO TABS: 650.0000 mg | ORAL_TABLET | Freq: Four times a day (QID) | ORAL | Status: DC | PRN

## 2017-02-27 MED ORDER — SODIUM CHLORIDE 0.9 % IV SOLN
Freq: Once | INTRAVENOUS | Status: AC
  Administered 2017-02-27: 14:00:00 via INTRAVENOUS

## 2017-02-27 NOTE — ED Notes (Signed)
CRITICAL VALUE ALERT  Critical Value:  K 2.7  Date & Time Notied:  02/27/2017  1329  Provider Notified: Dr. Lita Mains  Orders Received/Actions taken: 02/27/2017

## 2017-02-27 NOTE — ED Triage Notes (Signed)
Reports of hypotension (80/60), with nausea, dizziness and body aches x3 days. States Dr. Gerarda Fraction took blood work Friday but has not received blood work results yet. Patient reports he was taken off BP medications with diuretics Friday.

## 2017-02-27 NOTE — ED Notes (Signed)
Patient transported to CT 

## 2017-02-27 NOTE — H&P (Signed)
History and Physical    [name] [name] NLZ:[id number] DOB: 1941-04-23 DOA: 02/27/2017  PCP: Redmond School, MD Consultants:  Talbert Cage - oncology; [place] - cardiology; Lala Lund - orthopedics/pain; [place] - urology; Gala Romney - GI; [place] - podiatry; Luan Pulling - pulmonology Patient coming from: Home - lives alone; son checks on him daily; NOK: Son, [phone]  Chief Complaint: low BP  HPI: [name] [name] is a 76 y.o. male with medical history significant of OSA not on CPAP; hypothyroidism not on medication; HTN; HLD; glaucoma; chronic back pain; chronic diastolic HF (preserved EF with no comment on diastolic function on Echo in 4/18 but grade 1 diastolic dysfunction); and BPH presenting with hypotension.   He got up this AM and his BP was 80/50.  He was very dizzy.  He has been dealing with this up and down BP for 3-4 days.  Dr. Gerarda Fraction took him off his BP medication recently, last Friday maybe, 3-4 days ago.  He has been voiding less today than usual.  He is having a lot of pain in his feet, 3 outer toes stay numb all the time.  Currently feels a bit nauseated and tired with a slight headache.   He also notes a chronic ventral hernia that causes mild lower abdominal wall discomfort and chronic LBP for which he gets epidural injections.   ED Course: K+ 2.7, given 10 mEq KCl IV; 1L bolus for hypotension.  Abd CT.  Review of Systems: As per HPI; otherwise review of systems reviewed and negative.   Ambulatory Status:  Ambulates with a cane or walker  Past Medical History:  Diagnosis Date  . Assistance needed for mobility    cane, walker  . Bilateral lower leg cellulitis   . BPH (benign prostatic hyperplasia)   . Cataract    left  . Chronic diastolic HF (heart failure) ([place])   . Chronic low back pain   . Collagen vascular disease ([place])    history of venous insufficency and LE cellulitis  . Complication of anesthesia    2012 due to sleep apnea pt has had difficulty being put under  and waking up  from anesthesia  . Deep venous insufficiency   . GERD (gastroesophageal reflux disease)   . Glaucoma    left  . Headache   . Heel spur    right heel  . History of bladder infections   . History of colonic polyps   . Hyperlipidemia   . Hypertension    Sees Dr. Debara Pickett  . Hypothyroidism    hypothyroid denies no rx  . Iron deficiency 07/07/2016  . Kidney stones    passed them  . Obesity   . Osteoarthritis   . PONV (postoperative nausea and vomiting)   . PUD (peptic ulcer disease)   . Ringing in ears   . S/P colonoscopy 2007, Feb and March 2012   hx of adenomas, left-sided diverticula, On 07/2010 TCS, fresh blood and clot noted coming from TI.  . S/P endoscopy 2007, 2012   2007: nl, May 2012: antral erosions  . SIRS (systemic inflammatory response syndrome) ([place]) 02/27/2012  . Sleep apnea    supposed to see Dr. Luan Pulling to get it started back, hasn't been on in since 2018  . Thrombocytopenia ([place]) 12/30/2011   Stable    Past Surgical History:  Procedure Laterality Date  . BACK SURGERY    . CARDIAC CATHETERIZATION  02/27/2007   no sign of CAD, LVH, mod pulm HTN (Dr. Jackie Plum(  . CATARACT EXTRACTION  W/ INTRAOCULAR LENS IMPLANT Right ~ 2012  . COLONOSCOPY N/A 05/28/2014   QVZ:DGLOVFIE colonic polyps/colonic diverticulosis. Tubular adenomas. Next colonoscopy January 2021  . ESOPHAGOGASTRODUODENOSCOPY  Aug 2013   [facility], single 50mm pedunculated polyp otherwise normal. HYPERPLASTIC, NEGATIVE h.pylori  . ESOPHAGOGASTRODUODENOSCOPY N/A 05/28/2014   RMR:non-critical Schatzki's ring/HH. Benign gastritis  . INGUINAL HERNIA REPAIR Right 1941  . KNEE ARTHROSCOPY Left ~ 2000  . LAPAROSCOPIC CHOLECYSTECTOMY    . [place] SURGERY  2012  . MALONEY DILATION N/A 05/28/2014   Procedure: Venia Minks DILATION;  Surgeon: Daneil Dolin, MD;  [facility]: [facility];  Service: Endoscopy;  Laterality: N/A;  . NASAL SEPTOPLASTY W/ TURBINOPLASTY Bilateral 10/07/2015   Procedure: NASAL SEPTOPLASTY WITH TURBINATE  REDUCTION;  Surgeon: Leta Baptist, MD;  Location: [facility];  Service: ENT;  Laterality: Bilateral;  . NASAL SEPTUM SURGERY Bilateral 10/07/2015    inferior turbinate resection  . NM MYOCAR PERF WALL MOTION  12/2006   dipyridamole myoview - stress images show mild perfusion defect in mid inferior walls with reversibility at rest; mild perfusion defect in mid inferolateral wall at stress with mild defect reversibility, EF 62%, abnormal but low risk study   . POSTERIOR LUMBAR FUSION  2015  . SAVORY DILATION N/A 05/28/2014   Procedure: SAVORY DILATION;  Surgeon: Daneil Dolin, MD;  Location: [facility];  Service: Endoscopy;  Laterality: N/A;  . SHOULDER OPEN ROTATOR CUFF REPAIR Left early 2000s  . small bowel capsule study  07/2011   ?transient focal ischemia in distal small bowel to explain GI bleeding  . TRANSTHORACIC ECHOCARDIOGRAM  09/2008   EF 60-65%, mod conc LVH  . TRANSURETHRAL RESECTION OF PROSTATE    . TRANSURETHRAL RESECTION OF PROSTATE N/A 06/18/2013   Procedure: TRANSURETHRAL RESECTION OF THE PROSTATE (TURP);  Surgeon: Marissa Nestle, MD;  Location: [facility];  Service: Urology;  Laterality: N/A;    Social History   Social History  . Marital status: Widowed    Spouse name: Roger Kill  . Number of children: 5  . Years of education: College   Occupational History  . disability Retired   Social History Main Topics  . Smoking status: Former Smoker    Years: 14.00    Types: Cigars    Start date: 11/20/1959    Quit date: 08/10/1973  . Smokeless tobacco: Never Used  . Alcohol use No     Comment: denies  . Drug use: No  . Sexual activity: No   Other Topics Concern  . Not on file   Social History Narrative   Patient lives at home with spouse and son.   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        Allergies  Allergen Reactions  . Aspirin Other (See Comments)    Nausea and upset stomach    . Nexium [Esomeprazole Magnesium] Other (See Comments)     Patient said it messed his stomach up    Family History  Problem Relation Age of Onset  . Colon cancer Father 60       deceased  . Prostate cancer Father   . Pancreatic cancer Mother 22       deceased  . Prostate cancer Brother   . Arthritis Son   . Arthritis Son   . Diabetes Mellitus II Son     Prior to Admission medications   Medication Sig Start Date End Date Taking? Authorizing Provider  atorvastatin (LIPITOR) 40 MG tablet Take  1 tablet (40 mg total) by mouth daily at 6 PM. 11/10/16  Yes Lendon Colonel, NP  bisacodyl (DULCOLAX) 5 MG EC tablet Take 1 tablet (5 mg total) by mouth daily. 03/26/16  Yes Thurnell Lose, MD  brimonidine (ALPHAGAN) 0.2 % ophthalmic solution Place 1 drop into the right eye 2 times daily. 04/11/16 04/11/17 Yes [provider]  Cholecalciferol (VITAMIN D3) 1000 units CAPS Take 1 capsule by mouth. Takes 3 times per week   Yes [provider]  clopidogrel (PLAVIX) 75 MG tablet Take 1 tablet (75 mg total) by mouth daily. 11/10/16  Yes Lendon Colonel, NP  dorzolamide-timolol (COSOPT) 22.3-6.8 MG/ML ophthalmic solution Place 1 drop into both eyes 2 (two) times daily. 12/17/14  Yes [provider]  ferrous sulfate 325 (65 FE) MG tablet Take 325 mg by mouth daily with breakfast.   Yes [provider]  latanoprost (XALATAN) 0.005 % ophthalmic solution Place 1 drop into both eyes at bedtime.   Yes [provider]  magnesium oxide (MAG-OX) 400 MG tablet Take 400 mg by mouth daily. Takes 2 times per week   Yes [provider]  Menthol, Topical Analgesic, (BIOFREEZE) 4 % GEL To be used by therapy dept as needed   Yes [provider]  methocarbamol (ROBAXIN) 500 MG tablet Take 1 tablet (500 mg total) by mouth 2 (two) times daily. 10/24/16  Yes Francine Graven, DO  Oxycodone HCl 10 MG TABS Take 1 tablet (10 mg total) by mouth 2 (two) times daily as needed (pain). 09/16/16  Yes Reed, Tiffany L, DO    Polyethyl Glycol-Propyl Glycol (LUBRICANT EYE DROPS) 0.4-0.3 % SOLN Place 1 drop into both eyes daily as needed (for lubricant eye drops).    Yes [provider]  potassium chloride (K-DUR,KLOR-CON) 10 MEQ tablet Take 40 mEq by mouth 2 (two) times daily.    Yes [provider]  calcium citrate (CALCITRATE - DOSED IN MG ELEMENTAL CALCIUM) 950 MG tablet Take 200 mg of elemental calcium by mouth daily. Takes 3 times per week    [provider]  furosemide (LASIX) 40 MG tablet Take 1 tablet (40 mg total) by mouth 2 (two) times daily. Patient not taking: Reported on 02/27/2017 02/13/17   Arnoldo Lenis, MD  metolazone (ZAROXOLYN) 2.5 MG tablet Take 1 tablet (2.5 mg total) by mouth every Wednesday. Patient not taking: Reported on 02/27/2017 11/16/16   Lendon Colonel, NP  torsemide (DEMADEX) 20 MG tablet Take 2 tablets (40 mg total) by mouth daily. Patient not taking: Reported on 02/27/2017 11/10/16   Lendon Colonel, NP    Physical Exam: Vitals:   02/27/17 1430 02/27/17 1500 02/27/17 1530 02/27/17 1632  BP: 136/70 129/75 127/79 119/79  Pulse:  83 88 88  Resp: 19 18 (!) 22 20  Temp:      TempSrc:      SpO2:  93% 95% 93%  Weight:      Height:         General:  Appears calm and comfortable and is NAD Eyes:  PERRL, EOMI, normal lids, iris; marked xanthelasma bilaterally ENT:  grossly normal hearing, lips & tongue, mmm Neck:  no LAD, masses or thyromegaly; no carotid bruits Cardiovascular:  RRR, no m/r/g. No LE edema.  Respiratory:   CTA bilaterally with no wheezes/rales/rhonchi.  Normal respiratory effort. Abdomen:  soft, NT, ND, NABS; large ventral hernia noted Skin:  no rash or induration seen on limited exam Musculoskeletal:  grossly  normal tone BUE/BLE, good ROM, no bony abnormality Lower extremity:  No LE edema.  Limited foot exam with no ulcerations.  2+ distal pulses.  Pes planus.  Decreased sensation on B lateral 3 toes. Psychiatric:  grossly  normal mood and affect, speech fluent and appropriate, AOx3 Neurologic:  CN 2-12 grossly intact, moves all extremities in coordinated fashion    Radiological Exams on Admission: Ct Abdomen Pelvis W Contrast  Result Date: 02/27/2017 CLINICAL DATA:  76 year old with hypotension, nausea, dizziness and body aches. Back pain. EXAM: CT ABDOMEN AND PELVIS WITH CONTRAST TECHNIQUE: Multidetector CT imaging of the abdomen and pelvis was performed using the standard protocol following bolus administration of intravenous contrast. CONTRAST:  136mL ISOVUE-300 IOPAMIDOL (ISOVUE-300) INJECTION 61% COMPARISON:  03/22/2016 FINDINGS: Lower chest: Lung bases are clear except for dependent atelectasis or mild scarring. No large pleural effusions. Hepatobiliary: Decreased attenuation of the liver is compatible with hepatic steatosis. Again noted is a partially enhancing lesion at the hepatic dome measuring up to 1.6 cm. There is also an area in the right hepatic lobe on sequence 2, image 30 which is slightly hyperdense. These areas were present on the previous examination and previously more conspicuous. Findings could represent cavernous hemangiomas or transient hepatic attenuation differences. Gallbladder has been removed. Portal venous system is patent. Pancreas: Normal appearance of the pancreas without inflammation or duct dilatation. Spleen: Normal appearance of spleen without enlargement. Adrenals/Urinary Tract: Normal adrenal glands. Multiple renal cysts without hydronephrosis. Urinary bladder is unremarkable. Stomach/Bowel: Stomach is within normal limits. Appendix appears normal. No evidence of bowel wall thickening, distention, or inflammatory changes. Vascular/Lymphatic: Small amount of calcified plaque in the arterial structures without aneurysm. No significant abdominal or pelvic lymphadenopathy. Reproductive: Few calcifications in the prostate. Other: Low-density material in the presacral space. This soft tissue  measures 1.8 x 2.5 cm on sequence 2, image 86. There was soft tissue in this area dating back to 06/09/2013 in 2015 and this area measured 1.0 x 1.9 cm. No significant soft tissue in this area on 02/27/2012. Again noted is a large amount of intra-abdominal fat with a pannus formation. Left inguinal hernia containing fat. Musculoskeletal: Pedicle screw and rod fixation with interbody device at L5-S1. No acute bone abnormality. IMPRESSION: Slowly enlarging soft tissue in the presacral space. Findings are nonspecific but could be associated with extramedullary hematopoiesis. An indolent neoplastic process cannot be completely excluded. This structure is very low density and there could be a fluid or fat component. Consider further characterization with pelvic MRI. No acute abnormality within the abdomen or pelvis. Again noted are indeterminate enhancing or dense liver lesions. Suspect the lesion near the dome is a a cavernous hemangioma but the other lesion is indeterminate. These lesions are less conspicuous compared to the exam from 2017. These could be more definitively characterized with MRI. Bilateral renal cysts. Electronically Signed   By: Markus Daft M.D.   On: 02/27/2017 15:11    EKG: Independently reviewed.  NSR with rate 78; no evidence of acute ischemia   Labs on Admission: I have personally reviewed the available labs and imaging studies at the time of the admission.  Pertinent labs:   K+ 2.7 CO2 39 Glucose 129 BUN 18/Creatinine 1.16/GFR >60 [place] 3.4 Bili 1.5 WBC 14.6 Platelets 133   Assessment/Plan Principal Problem:   Weakness Active Problems:   Essential hypertension   Hypothyroidism   Thrombocytopenia (HCC)   Hyperlipidemia   OSA (obstructive sleep apnea)   Diastolic dysfunction by echo Dec 2014  Hypokalemia   Leukocytosis   Back pain   Ventral hernia   Weakness with reported hypotension with h/o HTN -Patient with h/o HTN who reports having been recently taken off  BP meds (he does not know what they were) -Pharmacy notes discontinuation of Lasix, Zaroxolyn, Demadex - so it is possible that the meds discontinued were actually multiple diuretics instead of BP medications -Normotensive while in the ER -Dr. Lita Mains mentioned concern for arrhythmia leading to the hypotension.   -Will observe overnight on telemetry but if normal telemetry and no further hypotension would suggest deferring further evaluation to outpatient PCP  HTN -No medications, as noted above  Hypothyroidism -Not on medications -Normal TSH on 02/09/17 -No further evaluation at this time  Hypokalemia -K+ 2.7 -Given 20mEq KCl in ER, which would be expected to raise K+ to 2.8 -Will give an additional 20 mEq in 1 additional L IVF -He is also due for his 40 mEq PO BID starting tonight, and so this would be expected to correct his deficiency -Will recheck in AM -Will continue telemetry -It will be important to monitor his K+ if the supplementation is continued while off diuretics as he will be at risk for hyperkalemia  Leukocytosis -Uncertain etiology since he does not manifest symptoms of acute illness -In review of prior labs, he is chronically with leukocytosis -He reviewed this issue with Dr. Talbert Cage on 9/20 and there was no further evaluation warranted at that time -He is due for 6 month f/u in March -Of note, on Abd CT today there was a report of "Slowly enlarging soft tissue in the presacral space. Findings are nonspecific but could be associated with extramedullary hematopoiesis. An indolent neoplastic process cannot be completely excluded." -Will defer to PCP/oncology, but would not further evaluate as an inpatient at this time.  Thrombocytopenia -Mild, also stable, also recently evaluated by Dr. Talbert Cage. -Actually improved from recent checks.  HLD -He is taking Lipitor 40 mg daily -He has marked xanthelasma which he says have been present for many years and are stable -Lipids  were checked last month and LDL is 50  OSA -Does not wear CPAP -Needs outpatient f/u with Dr. Luan Pulling to resume  CHF -Compensated, no concerns at this time -Will limit hydration to 2L total  Chronic back pain --I have reviewed this patient in the [place] Controlled Substances Reporting System.  He is receiving medications from only one provider and appears to be taking them as prescribed. -He is not at particularly high risk of opioid misuse, diversion, or overdose. -Continue home Oxycodone and Robaxin -The patient notes that Dr. Lita Mains suggested we might get an MRI of his hip as an inpatient but there is not a clear indication for this at this time so will defer to PCP/ortho -He does have chronic back pain for which he gets epidural injections; will defer to PCP and/or pain management/ortho  Ventral hernia -Stable -Consider outpatient surgical referral if patient would consider repair  DVT prophylaxis: Lovenox Code Status:  DNR - confirmed with patient Family Communication: None present Disposition Plan:  Home once clinically improved Consults called: None  Admission status: It is my clinical opinion that referral for OBSERVATION is reasonable and necessary in this patient based on the above information provided. The aforementioned taken together are felt to place the patient at high risk for further clinical deterioration. However it is anticipated that the patient may be medically stable for discharge from the hospital within 24 to 48 hours.  Karmen Bongo MD [facility]  If note is complete, please contact covering daytime or nighttime physician. [email or web address] Password [email or web address]  02/27/2017, 5:02 PM

## 2017-02-27 NOTE — ED Provider Notes (Signed)
[facility] DEPT Provider Note   CSN: [id number] Arrival date & time: 02/27/17  1007     History   Chief Complaint Chief Complaint  Patient presents with  . Hypotension    HPI [name] [name] is a 76 y.o. male.  HPI Patient states he is continued to have hypotensive episodes. Blood pressure dropped to 80/50 today. Associated with lightheadedness and nausea. He was taken off all blood pressure medications 3 days ago. Complains of pain to bilateral lower extremities and low back. No chest pain or shortness of breath. No recent trauma. Past Medical History:  Diagnosis Date  . Assistance needed for mobility    cane, walker  . Bilateral lower leg cellulitis   . BPH (benign prostatic hyperplasia)   . Cataract    left  . Chronic diastolic HF (heart failure) ([place])   . Chronic low back pain   . Collagen vascular disease ([place])    history of venous insufficency and LE cellulitis  . Complication of anesthesia    2012 due to sleep apnea pt has had difficulty being put under  and waking up from anesthesia  . Deep venous insufficiency   . GERD (gastroesophageal reflux disease)   . Glaucoma    left  . Headache   . Heel spur    right heel  . History of bladder infections   . History of colonic polyps   . Hyperlipidemia   . Hypertension    Sees Dr. Debara Pickett  . Hypothyroidism    hypothyroid denies no rx  . Iron deficiency 07/07/2016  . Kidney stones    passed them  . Obesity   . Osteoarthritis   . PONV (postoperative nausea and vomiting)   . PUD (peptic ulcer disease)   . Ringing in ears   . S/P colonoscopy 2007, Feb and March 2012   hx of adenomas, left-sided diverticula, On 07/2010 TCS, fresh blood and clot noted coming from TI.  . S/P endoscopy 2007, 2012   2007: nl, May 2012: antral erosions  . SIRS (systemic inflammatory response syndrome) ([place]) 02/27/2012  . Sleep apnea    supposed to see Dr. Luan Pulling to get it started back, hasn't been on in since 2018  .  Thrombocytopenia ([place]) 12/30/2011   Stable    Patient Active Problem List   Diagnosis Date Noted  . Weakness 02/27/2017  . Ventral hernia 02/27/2017  . Acute CVA (cerebrovascular accident) ([place]) 09/13/2016  . Stroke (cerebrum) ([place]) 09/13/2016  . Syncope 09/12/2016  . Iron deficiency 07/07/2016  . Back pain 03/22/2016  . Acute right-sided low back pain with right-sided sciatica   . S/P nasal septoplasty 10/07/2015  . Parotitis, acute 03/06/2015  . Nausea without vomiting 12/08/2014  . Dysphagia, pharyngoesophageal phase   . History of colonic polyps   . Hypokalemia 09/01/2013  . Dehydration 09/01/2013  . Leukocytosis 09/01/2013  . Acute on chronic diastolic CHF 28/31/5176  . Diastolic dysfunction by echo Dec 2014 07/30/2013  . Morbid obesity- BMI 44.5 07/30/2013  . Edema of both legs 07/30/2013  . Bilateral lower leg cellulitis 07/18/2013  . 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 OSA (obstructive sleep apnea) 11/27/2012  . Other spondylosis with radiculopathy, lumbar region 08/22/2012  . Bright red blood per rectum 06/15/2012  . Constipation 02/29/2012  . SIRS (systemic inflammatory response syndrome) ([place]) 02/27/2012  . BPH (benign prostatic hyperplasia) 02/27/2012  . Hyperlipidemia 02/27/2012  . Thrombocytopenia ([place]) 12/30/2011  . Hypothyroidism 10/27/2011  . Upper abdominal pain 10/27/2011  .  Rectal bleeding 11/16/2010  . DIARRHEA 06/08/2010  . COLONIC POLYPS, ADENOMATOUS, HX OF 07/16/2009  . Essential hypertension 06/19/2009  . GASTROESOPHAGEAL REFLUX DISEASE, CHRONIC 06/19/2009  . FATTY LIVER DISEASE 06/19/2009  . CHOLELITHIASIS, SYMPTOMATIC 06/19/2009  . ABDOMINAL BLOATING 06/19/2009    Past Surgical History:  Procedure Laterality Date  . BACK SURGERY    . CARDIAC CATHETERIZATION  02/27/2007   no sign of CAD, LVH, mod pulm HTN (Dr. Jackie Plum(  . CATARACT EXTRACTION W/ INTRAOCULAR LENS IMPLANT Right ~ 2012  . COLONOSCOPY N/A 05/28/2014   WNU:[id number]  colonic polyps/colonic diverticulosis. Tubular adenomas. Next colonoscopy January 2021  . ESOPHAGOGASTRODUODENOSCOPY  Aug 2013   [facility], single 89mm pedunculated polyp otherwise normal. HYPERPLASTIC, NEGATIVE h.pylori  . ESOPHAGOGASTRODUODENOSCOPY N/A 05/28/2014   RMR:non-critical Schatzki's ring/HH. Benign gastritis  . INGUINAL HERNIA REPAIR Right 1941  . KNEE ARTHROSCOPY Left ~ 2000  . LAPAROSCOPIC CHOLECYSTECTOMY    . [place] SURGERY  2012  . MALONEY DILATION N/A 05/28/2014   Procedure: Venia Minks DILATION;  Surgeon: Daneil Dolin, MD;  Location: [facility];  Service: Endoscopy;  Laterality: N/A;  . NASAL SEPTOPLASTY W/ TURBINOPLASTY Bilateral 10/07/2015   Procedure: NASAL SEPTOPLASTY WITH TURBINATE REDUCTION;  Surgeon: Leta Baptist, MD;  Location: [facility];  Service: ENT;  Laterality: Bilateral;  . NASAL SEPTUM SURGERY Bilateral 10/07/2015    inferior turbinate resection  . NM MYOCAR PERF WALL MOTION  12/2006   dipyridamole myoview - stress images show mild perfusion defect in mid inferior walls with reversibility at rest; mild perfusion defect in mid inferolateral wall at stress with mild defect reversibility, EF 62%, abnormal but low risk study   . POSTERIOR LUMBAR FUSION  2015  . SAVORY DILATION N/A 05/28/2014   Procedure: SAVORY DILATION;  Surgeon: Daneil Dolin, MD;  Location: [facility];  Service: Endoscopy;  Laterality: N/A;  . SHOULDER OPEN ROTATOR CUFF REPAIR Left early 2000s  . small bowel capsule study  07/2011   ?transient focal ischemia in distal small bowel to explain GI bleeding  . TRANSTHORACIC ECHOCARDIOGRAM  09/2008   EF 60-65%, mod conc LVH  . TRANSURETHRAL RESECTION OF PROSTATE    . TRANSURETHRAL RESECTION OF PROSTATE N/A 06/18/2013   Procedure: TRANSURETHRAL RESECTION OF THE PROSTATE (TURP);  Surgeon: Marissa Nestle, MD;  Location: [facility];  Service: Urology;  Laterality: N/A;       Home Medications    Prior to Admission medications   Medication Sig Start Date End  Date Taking? Authorizing Provider  atorvastatin (LIPITOR) 40 MG tablet Take 1 tablet (40 mg total) by mouth daily at 6 PM. 11/10/16  Yes Lendon Colonel, NP  bisacodyl (DULCOLAX) 5 MG EC tablet Take 1 tablet (5 mg total) by mouth daily. 03/26/16  Yes Thurnell Lose, MD  brimonidine (ALPHAGAN) 0.2 % ophthalmic solution Place 1 drop into the right eye 2 times daily. 04/11/16 04/11/17 Yes [provider]  Cholecalciferol (VITAMIN D3) 1000 units CAPS Take 1 capsule by mouth. Takes 3 times per week   Yes [provider]  clopidogrel (PLAVIX) 75 MG tablet Take 1 tablet (75 mg total) by mouth daily. 11/10/16  Yes Lendon Colonel, NP  dorzolamide-timolol (COSOPT) 22.3-6.8 MG/ML ophthalmic solution Place 1 drop into both eyes 2 (two) times daily. 12/17/14  Yes [provider]  ferrous sulfate 325 (65 FE) MG tablet Take 325 mg by mouth daily with breakfast.   Yes [provider]  latanoprost (XALATAN) 0.005 % ophthalmic solution  Place 1 drop into both eyes at bedtime.   Yes [provider]  magnesium oxide (MAG-OX) 400 MG tablet Take 400 mg by mouth daily. Takes 2 times per week   Yes [provider]  Menthol, Topical Analgesic, (BIOFREEZE) 4 % GEL To be used by therapy dept as needed   Yes [provider]  methocarbamol (ROBAXIN) 500 MG tablet Take 1 tablet (500 mg total) by mouth 2 (two) times daily. 10/24/16  Yes Francine Graven, DO  Oxycodone HCl 10 MG TABS Take 1 tablet (10 mg total) by mouth 2 (two) times daily as needed (pain). 09/16/16  Yes Reed, Tiffany L, DO  Polyethyl Glycol-Propyl Glycol (LUBRICANT EYE DROPS) 0.4-0.3 % SOLN Place 1 drop into both eyes daily as needed (for lubricant eye drops).    Yes [provider]  potassium chloride (K-DUR,KLOR-CON) 10 MEQ tablet Take 40 mEq by mouth 2 (two) times daily.    Yes [provider]  calcium citrate (CALCITRATE - DOSED IN MG ELEMENTAL CALCIUM) 950 MG tablet Take 200  mg of elemental calcium by mouth daily. Takes 3 times per week    [provider]    Family History Family History  Problem Relation Age of Onset  . Colon cancer Father 49       deceased  . Prostate cancer Father   . Pancreatic cancer Mother 81       deceased  . Prostate cancer Brother   . Arthritis Son   . Arthritis Son   . Diabetes Mellitus II Son     Social History Social History  Substance Use Topics  . Smoking status: Former Smoker    Years: 14.00    Types: Cigars    Start date: 11/20/1959    Quit date: 08/10/1973  . Smokeless tobacco: Never Used  . Alcohol use No     Comment: denies     Allergies   Aspirin and Nexium [esomeprazole magnesium]   Review of Systems Review of Systems  Constitutional: Positive for diaphoresis and fatigue. Negative for chills and fever.  HENT: Negative for congestion, sinus pressure and trouble swallowing.   Eyes: Negative for visual disturbance.  Respiratory: Negative for cough and shortness of breath.   Cardiovascular: Negative for chest pain, palpitations and leg swelling.  Gastrointestinal: Positive for abdominal pain. Negative for diarrhea, nausea and vomiting.  Genitourinary: Negative for dysuria, flank pain, frequency and hematuria.  Musculoskeletal: Positive for back pain and myalgias. Negative for neck pain and neck stiffness.  Skin: Negative for rash and wound.  Neurological: Positive for dizziness and light-headedness. Negative for syncope, weakness, numbness and headaches.  All other systems reviewed and are negative.    Physical Exam Updated Vital Signs BP (!) 105/57 (BP Location: Right Arm)   Pulse 74   Temp 98 F (36.7 C) (Oral)   Resp 20   Ht 5\' 6"  (1.676 m)   Wt 117.8 kg (259 lb 12.8 oz)   SpO2 96%   BMI 41.93 kg/m   Physical Exam  Constitutional: He is oriented to person, place, and time. He appears well-developed and well-nourished. No distress.  HENT:  Head: Normocephalic and atraumatic.    Mouth/Throat: Oropharynx is clear and moist. No oropharyngeal exudate.  Eyes: Pupils are equal, round, and reactive to light. EOM are normal.  No nystagmus  Neck: Normal range of motion. Neck supple. No JVD present.  Cardiovascular: Normal rate and regular rhythm.  Exam reveals no gallop and no friction rub.   No  murmur heard. Pulmonary/Chest: Effort normal and breath sounds normal. No respiratory distress. He has no wheezes. He has no rales. He exhibits no tenderness.  Abdominal: Soft. Bowel sounds are normal. He exhibits mass. There is tenderness. There is no rebound and no guarding.  Large ventral wall hernia. Patient does have tenderness especially in the epigastric region. No rebound or guarding.  Musculoskeletal: Normal range of motion. He exhibits no edema or tenderness.  No lower extremity swelling, asymmetry or tenderness. Distal pulses are 2+. No midline thoracic or lumbar tenderness. Patient is tender to palpation over the right lumbar paraspinal musculature.  Neurological: He is alert and oriented to person, place, and time.  Patient is alert and oriented x3 with clear, goal oriented speech. Patient has 5/5 motor in all extremities. Diminished movement at right hip due to pain. Sensation is intact to light touch. Bilateral finger-to-nose is normal with no signs of dysmetria.   Skin: Skin is warm and dry. Capillary refill takes less than 2 seconds. No rash noted. No erythema.  Psychiatric: He has a normal mood and affect. His behavior is normal.  Nursing note and vitals reviewed.    ED Treatments / Results  Labs (all labs ordered are listed, but only abnormal results are displayed) Labs Reviewed  CBC - Abnormal; Notable for the following:       Result Value   WBC 14.6 (*)    RDW 17.0 (*)    Platelets 133 (*)    All other components within normal limits  COMPREHENSIVE METABOLIC PANEL - Abnormal; Notable for the following:    Potassium 2.7 (*)    Chloride 90 (*)    CO2 39  (*)    Glucose, Bld 129 (*)    Total Protein 6.2 (*)    Albumin 3.4 (*)    ALT 14 (*)    Total Bilirubin 1.5 (*)    GFR calc non Af Amer 59 (*)    All other components within normal limits  BASIC METABOLIC PANEL - Abnormal; Notable for the following:    Potassium 3.0 (*)    Chloride 95 (*)    CO2 34 (*)    Glucose, Bld 134 (*)    All other components within normal limits  CBC - Abnormal; Notable for the following:    WBC 14.2 (*)    RDW 17.3 (*)    Platelets 125 (*)    All other components within normal limits  CBG MONITORING, ED - Abnormal; Notable for the following:    Glucose-Capillary 140 (*)    All other components within normal limits  URINALYSIS, ROUTINE W REFLEX MICROSCOPIC  MAGNESIUM  LIPASE, BLOOD    EKG  EKG Interpretation  Date/Time:  Monday February 27 2017 10:20:42 EDT Ventricular Rate:  78 PR Interval:  206 QRS Duration: 98 QT Interval:  402 QTC Calculation: 458 R Axis:   -15 Text Interpretation:  Normal sinus rhythm Normal ECG Confirmed by Julianne Rice [phone]) on 02/28/2017 11:41:48 AM       Radiology Ct Abdomen Pelvis W Contrast  Result Date: 02/27/2017 CLINICAL DATA:  76 year old with hypotension, nausea, dizziness and body aches. Back pain. EXAM: CT ABDOMEN AND PELVIS WITH CONTRAST TECHNIQUE: Multidetector CT imaging of the abdomen and pelvis was performed using the standard protocol following bolus administration of intravenous contrast. CONTRAST:  125mL ISOVUE-300 IOPAMIDOL (ISOVUE-300) INJECTION 61% COMPARISON:  03/22/2016 FINDINGS: Lower chest: Lung bases are clear except for dependent atelectasis or mild scarring. No large pleural effusions. Hepatobiliary: Decreased  attenuation of the liver is compatible with hepatic steatosis. Again noted is a partially enhancing lesion at the hepatic dome measuring up to 1.6 cm. There is also an area in the right hepatic lobe on sequence 2, image 30 which is slightly hyperdense. These areas were present on the  previous examination and previously more conspicuous. Findings could represent cavernous hemangiomas or transient hepatic attenuation differences. Gallbladder has been removed. Portal venous system is patent. Pancreas: Normal appearance of the pancreas without inflammation or duct dilatation. Spleen: Normal appearance of spleen without enlargement. Adrenals/Urinary Tract: Normal adrenal glands. Multiple renal cysts without hydronephrosis. Urinary bladder is unremarkable. Stomach/Bowel: Stomach is within normal limits. Appendix appears normal. No evidence of bowel wall thickening, distention, or inflammatory changes. Vascular/Lymphatic: Small amount of calcified plaque in the arterial structures without aneurysm. No significant abdominal or pelvic lymphadenopathy. Reproductive: Few calcifications in the prostate. Other: Low-density material in the presacral space. This soft tissue measures 1.8 x 2.5 cm on sequence 2, image 86. There was soft tissue in this area dating back to 06/09/2013 in 2015 and this area measured 1.0 x 1.9 cm. No significant soft tissue in this area on 02/27/2012. Again noted is a large amount of intra-abdominal fat with a pannus formation. Left inguinal hernia containing fat. Musculoskeletal: Pedicle screw and rod fixation with interbody device at L5-S1. No acute bone abnormality. IMPRESSION: Slowly enlarging soft tissue in the presacral space. Findings are nonspecific but could be associated with extramedullary hematopoiesis. An indolent neoplastic process cannot be completely excluded. This structure is very low density and there could be a fluid or fat component. Consider further characterization with pelvic MRI. No acute abnormality within the abdomen or pelvis. Again noted are indeterminate enhancing or dense liver lesions. Suspect the lesion near the dome is a a cavernous hemangioma but the other lesion is indeterminate. These lesions are less conspicuous compared to the exam from 2017.  These could be more definitively characterized with MRI. Bilateral renal cysts. Electronically Signed   By: Markus Daft M.D.   On: 02/27/2017 15:11    Procedures Procedures (including critical care time)  Medications Ordered in ED Medications  atorvastatin (LIPITOR) tablet 40 mg (not administered)  clopidogrel (PLAVIX) tablet 75 mg (75 mg Oral Given 02/28/17 0936)  methocarbamol (ROBAXIN) tablet 500 mg (500 mg Oral Given 02/28/17 0936)  potassium chloride SA (K-DUR,KLOR-CON) CR tablet 40 mEq (40 mEq Oral Given 02/28/17 0936)  oxyCODONE (Oxy IR/ROXICODONE) immediate release tablet 10 mg (10 mg Oral Given 02/27/17 2015)  brimonidine (ALPHAGAN) 0.2 % ophthalmic solution 1 drop (1 drop Right Eye Given 02/28/17 0936)  bisacodyl (DULCOLAX) EC tablet 5 mg (5 mg Oral Given 02/28/17 0936)  dorzolamide-timolol (COSOPT) 22.3-6.8 MG/ML ophthalmic solution 1 drop (1 drop Both Eyes Given 02/28/17 0936)  magnesium oxide (MAG-OX) tablet 400 mg (400 mg Oral Given 02/28/17 0936)  latanoprost (XALATAN) 0.005 % ophthalmic solution 1 drop (1 drop Both Eyes Given 02/27/17 2346)  polyvinyl alcohol (LIQUIFILM TEARS) 1.4 % ophthalmic solution 1 drop (not administered)  enoxaparin (LOVENOX) injection 60 mg (60 mg Subcutaneous Given 02/27/17 2134)  acetaminophen (TYLENOL) tablet 650 mg (not administered)    Or  acetaminophen (TYLENOL) suppository 650 mg (not administered)  docusate sodium (COLACE) capsule 100 mg (100 mg Oral Given 02/28/17 0936)  ondansetron (ZOFRAN) tablet 4 mg (not administered)    Or  ondansetron (ZOFRAN) injection 4 mg (not administered)  sodium chloride flush (NS) 0.9 % injection 3 mL (3 mLs Intravenous Given 02/28/17 0937)  0.9 %  NaCl with KCl 20 mEq/ L  infusion (not administered)  potassium chloride 10 mEq in 100 mL IVPB (10 mEq Intravenous New Bag/Given 02/27/17 1744)  0.9 %  sodium chloride infusion ( Intravenous New Bag/Given 02/27/17 1353)  iopamidol (ISOVUE-300) 61 % injection 100 mL (100 mLs  Intravenous Contrast Given 02/27/17 1426)     Initial Impression / Assessment and Plan / ED Course  I have reviewed the triage vital signs and the nursing notes.  Pertinent labs & imaging results that were available during my care of the patient were reviewed by me and considered in my medical decision making (see chart for details).     Given recurrent hypotensive and near-syncopal episodes, will admit for telemetry observation. Start on potassium replacement in the emergency department.  Final Clinical Impressions(s) / ED Diagnoses   Final diagnoses:  Near syncope  Hypokalemia    New Prescriptions Current Discharge Medication List       Julianne Rice, MD 02/28/17 1142

## 2017-02-28 DIAGNOSIS — I10 Essential (primary) hypertension: Secondary | ICD-10-CM

## 2017-02-28 DIAGNOSIS — R531 Weakness: Secondary | ICD-10-CM | POA: Diagnosis not present

## 2017-02-28 LAB — BASIC METABOLIC PANEL
Anion gap: 8 (ref 5–15)
BUN: 16 mg/dL (ref 6–20)
CALCIUM: 9 mg/dL (ref 8.9–10.3)
CO2: 34 mmol/L — AB (ref 22–32)
Chloride: 95 mmol/L — ABNORMAL LOW (ref 101–111)
Creatinine, Ser: 0.95 mg/dL (ref 0.61–1.24)
GFR calc Af Amer: >60 mL/min (ref >60)
GLUCOSE: 134 mg/dL — AB (ref 65–99)
POTASSIUM: 3 mmol/L — AB (ref 3.5–5.1)
Sodium: 137 mmol/L (ref 135–145)

## 2017-02-28 LAB — CBC
HEMATOCRIT: 40.4 % (ref 39.0–52.0)
Hemoglobin: 13.1 g/dL (ref 13.0–17.0)
MCH: 26.4 pg (ref 26.0–34.0)
MCHC: 32.4 g/dL (ref 30.0–36.0)
MCV: 81.5 fL (ref 78.0–100.0)
Platelets: 125 10*3/uL — ABNORMAL LOW (ref 150–400)
RBC: 4.96 MIL/uL (ref 4.22–5.81)
RDW: 17.3 % — AB (ref 11.5–15.5)
WBC: 14.2 10*3/uL — ABNORMAL HIGH (ref 4.0–10.5)

## 2017-02-28 NOTE — Care Management Obs Status (Signed)
[facility] NOTIFICATION   Patient Details  Name: Drew Lips MRN: [id number] Date of Birth: Aug 24, 1940   Medicare Observation Status Notification Given:  Yes    Shauntae Reitman, Chauncey Reading, RN 02/28/2017, 11:05 AM

## 2017-02-28 NOTE — Discharge Summary (Signed)
Physician Discharge Summary  Siddharth Babington OZD:[id number] DOB: [birth year] DOA: 02/27/2017  PCP: Redmond School, MD  Admit date: 02/27/2017 Discharge date: 02/28/2017  Time spent: 45 minutes  Recommendations for Outpatient Follow-up:  -To be discharged home today. -Will need follow-up with PCP in 2 weeks.   Discharge Diagnoses:  Principal Problem:   Weakness Active Problems:   Essential hypertension   Hypothyroidism   Thrombocytopenia (HCC)   Hyperlipidemia   OSA (obstructive sleep apnea)   Diastolic dysfunction by echo Dec 2014   Hypokalemia   Leukocytosis   Back pain   Ventral hernia   Discharge Condition: Stable and improved  Filed Weights   02/27/17 1012 02/27/17 1823  Weight: 123.8 kg (273 lb) 117.8 kg (259 lb 12.8 oz)    History of present illness:  As per Dr. Lorin Mercy on 10/8: [name] [name] is a 76 y.o. male with medical history significant of OSA not on CPAP; hypothyroidism not on medication; HTN; HLD; glaucoma; chronic back pain; chronic diastolic HF (preserved EF with no comment on diastolic function on Echo in 4/18 but grade 1 diastolic dysfunction); and BPH presenting with hypotension.   He got up this AM and his BP was 80/50.  He was very dizzy.  He has been dealing with this up and down BP for 3-4 days.  Dr. Gerarda Fraction took him off his BP medication recently, last Friday maybe, 3-4 days ago.  He has been voiding less today than usual.  He is having a lot of pain in his feet, 3 outer toes stay numb all the time.  Currently feels a bit nauseated and tired with a slight headache.   He also notes a chronic ventral hernia that causes mild lower abdominal wall discomfort and chronic LBP for which he gets epidural injections.  ED Course: K+ 2.7, given 10 mEq KCl IV; 1L bolus for hypotension.     Hospital Course:   Generalized weakness with reported hypotension -Weakness is at baseline, hypotension has resolved, of note, he was never hypotensive in the  ED. -Patient is anxious for discharge home today.  Hypokalemia -Was 2.7 on admission 53.0 on discharge, will be given an additional 40 mEq orally prior to discharge.  Presacral mass -Per abdominal CT report: Slowly enlarging soft tissue mass in the presacral space. Findings are nonspecific but could be associated with extra medullary hematopoiesis. An indolent neoplastic process cannot be completely excluded. -Will defer outpatient follow-up of this to PCP, no inpatient management is required.  Procedures:  None   Consultations:  None  Discharge Instructions  Discharge Instructions    Diet - low sodium heart healthy    Complete by:  As directed    Increase activity slowly    Complete by:  As directed      Allergies as of 02/28/2017      Reactions   Aspirin Other (See Comments)   Nausea and upset stomach     Nexium [esomeprazole Magnesium] Other (See Comments)   Patient said it messed his stomach up      Medication List    TAKE these medications   atorvastatin 40 MG tablet Commonly known as:  LIPITOR Take 1 tablet (40 mg total) by mouth daily at 6 PM.   BIOFREEZE 4 % Gel Generic drug:  Menthol (Topical Analgesic) To be used by therapy dept as needed Notes to patient:  USE AS NEEDED   bisacodyl 5 MG EC tablet Commonly known as:  DULCOLAX Take 1 tablet (5 mg total)  by mouth daily.   brimonidine 0.2 % ophthalmic solution Commonly known as:  ALPHAGAN Place 1 drop into the right eye 2 times daily.   calcium citrate 950 MG tablet Commonly known as:  CALCITRATE - dosed in mg elemental calcium Take 200 mg of elemental calcium by mouth daily. Takes 3 times per week   clopidogrel 75 MG tablet Commonly known as:  PLAVIX Take 1 tablet (75 mg total) by mouth daily.   dorzolamide-timolol 22.3-6.8 MG/ML ophthalmic solution Commonly known as:  COSOPT Place 1 drop into both eyes 2 (two) times daily.   ferrous sulfate 325 (65 FE) MG tablet Take 325 mg by mouth daily  with breakfast.   latanoprost 0.005 % ophthalmic solution Commonly known as:  XALATAN Place 1 drop into both eyes at bedtime.   LUBRICANT EYE DROPS 0.4-0.3 % Soln Generic drug:  Polyethyl Glycol-Propyl Glycol Place 1 drop into both eyes daily as needed (for lubricant eye drops). Notes to patient:  USE THIS MEDICATION AS NEEDED   magnesium oxide 400 MG tablet Commonly known as:  MAG-OX Take 400 mg by mouth daily. Takes 2 times per week   methocarbamol 500 MG tablet Commonly known as:  ROBAXIN Take 1 tablet (500 mg total) by mouth 2 (two) times daily.   Oxycodone HCl 10 MG Tabs Take 1 tablet (10 mg total) by mouth 2 (two) times daily as needed (pain). Notes to patient:  TAKE THIS MEDICATION AS NEEDED FOR PAIN   potassium chloride 10 MEQ tablet Commonly known as:  K-DUR,KLOR-CON Take 40 mEq by mouth 2 (two) times daily.   Vitamin D3 1000 units Caps Take 1 capsule by mouth. Takes 3 times per week      Allergies  Allergen Reactions  . Aspirin Other (See Comments)    Nausea and upset stomach    . Nexium [Esomeprazole Magnesium] Other (See Comments)    Patient said it messed his stomach up   Follow-up Information    Redmond School, MD. Schedule an appointment as soon as possible for a visit in 2 week(s).   Specialty:  Internal Medicine Contact information: [address] [place] [address] [phone]            The results of significant diagnostics from this hospitalization (including imaging, microbiology, ancillary and laboratory) are listed below for reference.    Significant Diagnostic Studies: Ct Abdomen Pelvis W Contrast  Result Date: 02/27/2017 CLINICAL DATA:  76 year old with hypotension, nausea, dizziness and body aches. Back pain. EXAM: CT ABDOMEN AND PELVIS WITH CONTRAST TECHNIQUE: Multidetector CT imaging of the abdomen and pelvis was performed using the standard protocol following bolus administration of intravenous contrast. CONTRAST:   147mL ISOVUE-300 IOPAMIDOL (ISOVUE-300) INJECTION 61% COMPARISON:  03/22/2016 FINDINGS: Lower chest: Lung bases are clear except for dependent atelectasis or mild scarring. No large pleural effusions. Hepatobiliary: Decreased attenuation of the liver is compatible with hepatic steatosis. Again noted is a partially enhancing lesion at the hepatic dome measuring up to 1.6 cm. There is also an area in the right hepatic lobe on sequence 2, image 30 which is slightly hyperdense. These areas were present on the previous examination and previously more conspicuous. Findings could represent cavernous hemangiomas or transient hepatic attenuation differences. Gallbladder has been removed. Portal venous system is patent. Pancreas: Normal appearance of the pancreas without inflammation or duct dilatation. Spleen: Normal appearance of spleen without enlargement. Adrenals/Urinary Tract: Normal adrenal glands. Multiple renal cysts without hydronephrosis. Urinary bladder is unremarkable. Stomach/Bowel: Stomach is within normal  limits. Appendix appears normal. No evidence of bowel wall thickening, distention, or inflammatory changes. Vascular/Lymphatic: Small amount of calcified plaque in the arterial structures without aneurysm. No significant abdominal or pelvic lymphadenopathy. Reproductive: Few calcifications in the prostate. Other: Low-density material in the presacral space. This soft tissue measures 1.8 x 2.5 cm on sequence 2, image 86. There was soft tissue in this area dating back to 06/09/2013 in 2015 and this area measured 1.0 x 1.9 cm. No significant soft tissue in this area on 02/27/2012. Again noted is a large amount of intra-abdominal fat with a pannus formation. Left inguinal hernia containing fat. Musculoskeletal: Pedicle screw and rod fixation with interbody device at L5-S1. No acute bone abnormality. IMPRESSION: Slowly enlarging soft tissue in the presacral space. Findings are nonspecific but could be associated  with extramedullary hematopoiesis. An indolent neoplastic process cannot be completely excluded. This structure is very low density and there could be a fluid or fat component. Consider further characterization with pelvic MRI. No acute abnormality within the abdomen or pelvis. Again noted are indeterminate enhancing or dense liver lesions. Suspect the lesion near the dome is a a cavernous hemangioma but the other lesion is indeterminate. These lesions are less conspicuous compared to the exam from 2017. These could be more definitively characterized with MRI. Bilateral renal cysts. Electronically Signed   By: Markus Daft M.D.   On: 02/27/2017 15:11    Microbiology: No results found for this or any previous visit (from the past 240 hour(s)).   Labs: Basic Metabolic Panel:  Recent Labs Lab 02/27/17 1226 02/28/17 0428  NA 140 137  K 2.7* 3.0*  CL 90* 95*  CO2 39* 34*  GLUCOSE 129* 134*  BUN 18 16  CREATININE 1.16 0.95  CALCIUM 9.8 9.0  MG 2.0  --    Liver Function Tests:  Recent Labs Lab 02/27/17 1226  AST 20  ALT 14*  ALKPHOS 72  BILITOT 1.5*  PROT 6.2*  ALBUMIN 3.4*    Recent Labs Lab 02/27/17 1226  LIPASE 42   No results for input(s): AMMONIA in the last 168 hours. CBC:  Recent Labs Lab 02/27/17 1244 02/28/17 0428  WBC 14.6* 14.2*  HGB 14.0 13.1  HCT 42.5 40.4  MCV 80.5 81.5  PLT 133* 125*   Cardiac Enzymes: No results for input(s): CKTOTAL, CKMB, CKMBINDEX, TROPONINI in the last 168 hours. BNP: BNP (last 3 results)  Recent Labs  03/22/16 0832  BNP 24.0    ProBNP (last 3 results) No results for input(s): PROBNP in the last 8760 hours.  CBG:  Recent Labs Lab 02/27/17 1023  GLUCAP 140*       Signed:  HERNANDEZ ACOSTA,Quinci Gavidia  [facility] Pager: [phone] 02/28/2017, 5:44 PM

## 2017-02-28 NOTE — Care Management Note (Signed)
Case Management Note  Patient Details  Name: [name] [name] MRN: [id number] Date of Birth: Apr 05, 1941  Subjective/Objective: Adm from home with weakness, hypokalemia. Lives alone, son checks on him daily. He walks with cane, has RW if needed. Has a CPAP, but does not wear. Reports cardiology is referring him back to Pulmonology to assess. Has PCP, son drives him to all appointments. Reports no issues affording medications.                    Action/Plan: Plans to return home. Anticipate DC today.   Expected Discharge Date:     02/28/2017             Expected Discharge Plan:  Home/Self Care  In-House Referral:     Discharge planning Services  CM Consult  Post Acute Care Choice:  NA Choice offered to:  NA  DME Arranged:    DME Agency:     HH Arranged:    HH Agency:     Status of Service:  Completed, signed off  If discussed at [employer] of Stay Meetings, dates discussed:    Additional Comments:  [name] [name], Chauncey Reading, RN 02/28/2017, 12:25 PM

## 2017-02-28 NOTE — Progress Notes (Signed)
D/C'd home via w/c and private automobile accompanied by son.  All d/c instructions were reviewed and discussed with patient.  All questions/concerns were addressed.  No Prescriptions given to patient.  D/c'd in stable condition

## 2017-03-01 ENCOUNTER — Other Ambulatory Visit: Payer: Self-pay | Admitting: *Deleted

## 2017-03-01 NOTE — Patient Outreach (Signed)
[place] [facility]) Care Management  03/01/2017  Carter Kassel Apr 11, 1941 [id number]   Transition of Care Referral  Referral Date: 03/01/17 Referral Source: HTA Date of Discharge: 02/27/17 Facility: [facility] Discharge Diagnosis: Weakness Insurance: HTA  Outreach attempt to patient. HIPAA verified with patient's son Evette Doffing). Evette Doffing reported, patient continues to exhibit some weakness. Patient requires stand by assist. Patient was not discharged home with any home health services in place. Patient was recently in the hospital due to hypotension/near syncope. Per Evette Doffing, patient's blood pressure medication was discontinued during patient's hospitalization. Vincent manages patient medications to ensure that patient takes his medications as prescribed. Patient had several hospital, ED, and rehab admissions in  2018, per EMR. Patient has a history of CHF. Patient had a weight gain of 5 pounds at his last Cardiology visit on 02/07/17. He had a 3 pound weight gain at his last hospital stay on 02/27/17. Per EMR, patient is not using his CPAP as prescribed. Patient has a primary MD appointment scheduled for 03/03/17. [facility] services and benefits explained to [place]. Evette Doffing agreed to [facility] services.    Plan: RN CM will send referral to [facility] RN for further in home eval/assessment of care needs and management of chronic conditions. RN CM will send referral to [place] for transition of care. RN CM advised patient that [facility] community RN CM would follow up and contact patient within the next 10 days.  Lake Bells, RN, BSN, MHA/MSL, [place] Telephonic Care Manager Coordinator [facility] Direct Phone: [phone] Toll Free: [phone] Fax: [phone]

## 2017-03-01 NOTE — Patient Outreach (Signed)
[place] [facility]) Care Management  03/01/2017  [name] 1940-10-20 [id number]   I am familiar with [name] who is a 76 year old male patient living alone in [place]. [name] has a medical history which includes OSA (not on CPAP), hypothyroidism, Hypertension, hyperlipidemia, glaucoma, low back pain, chronic diastolic heart failure, and benign prostatic hypertrophy.   [name] was admtitted to the hospital on 02/27/17 and discharged on 02/28/17 after admission for hypotension. [name] was discharged with orders to follow up with his primary care provider. I reached out to the practice patient care coordinator to request assistance with scheduling a follow up appointment.   Plan: I will reach out to [name] by phone on Thursday, 03/02/17.    [name]  [phone]

## 2017-03-02 ENCOUNTER — Encounter: Payer: Self-pay | Admitting: *Deleted

## 2017-03-03 ENCOUNTER — Ambulatory Visit (INDEPENDENT_AMBULATORY_CARE_PROVIDER_SITE_OTHER): Payer: PPO | Admitting: Sports Medicine

## 2017-03-03 ENCOUNTER — Other Ambulatory Visit: Payer: Self-pay | Admitting: *Deleted

## 2017-03-03 VITALS — BP 130/84 | Ht 66.0 in | Wt 260.0 lb

## 2017-03-03 DIAGNOSIS — M170 Bilateral primary osteoarthritis of knee: Secondary | ICD-10-CM

## 2017-03-03 DIAGNOSIS — I10 Essential (primary) hypertension: Secondary | ICD-10-CM | POA: Diagnosis not present

## 2017-03-03 DIAGNOSIS — E119 Type 2 diabetes mellitus without complications: Secondary | ICD-10-CM | POA: Diagnosis not present

## 2017-03-03 DIAGNOSIS — L03116 Cellulitis of left lower limb: Secondary | ICD-10-CM

## 2017-03-03 DIAGNOSIS — L03115 Cellulitis of right lower limb: Secondary | ICD-10-CM

## 2017-03-03 DIAGNOSIS — R600 Localized edema: Secondary | ICD-10-CM | POA: Diagnosis not present

## 2017-03-03 DIAGNOSIS — E669 Obesity, unspecified: Secondary | ICD-10-CM | POA: Diagnosis not present

## 2017-03-03 DIAGNOSIS — R201 Hypoesthesia of skin: Secondary | ICD-10-CM | POA: Diagnosis not present

## 2017-03-03 DIAGNOSIS — I5032 Chronic diastolic (congestive) heart failure: Secondary | ICD-10-CM | POA: Diagnosis not present

## 2017-03-03 DIAGNOSIS — I959 Hypotension, unspecified: Secondary | ICD-10-CM | POA: Diagnosis not present

## 2017-03-03 DIAGNOSIS — Z6841 Body Mass Index (BMI) 40.0 and over, adult: Secondary | ICD-10-CM | POA: Diagnosis not present

## 2017-03-03 MED ORDER — METHYLPREDNISOLONE ACETATE 40 MG/ML IJ SUSP
80.0000 mg | Freq: Once | INTRAMUSCULAR | Status: AC
  Administered 2017-03-03: 80 mg via INTRA_ARTICULAR
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
## 2017-03-03 NOTE — Telephone Encounter (Signed)
This encounter was created in error - please disregard.

## 2017-03-03 NOTE — Progress Notes (Signed)
Chief complaint: Bilateral knee pain 6 months  History of present illness: [name] [name] is a 76 year old male who presents to the sports medicine office today, accompanied by son, with chief complaint of bilateral knee pain. He reports that he has had chronic issues of mainly left knee pain, now starts to have symptoms in the right knee. He reports acute on chronic symptoms for the last 6 months now. He is not report of any inciting incident, trauma, or injury to explain the pain. He reports the pain being a throbbing pain and an aching pain. He points to both medial and lateral joint lines of both knees as to where pain generator is at. He reports of popping, locking, and crepitus sensation. Does not report of any symptoms of giving way. He does ambulate with a cane. He has been using some Aleve, with no improvement in symptoms. He reports occasionally noticing some slight swelling in his legs. Does not report of any numbness or tingling paresthesias. He last had x-ray of his left knee back in 2015 that did show osteoarthritis then. Today, he describes the pain as a 10/10.  Review of systems:  As stated above  His past medical history, surgical history, family history, and social history obtained and reviewed. Does have known history of hypertension, GERD, fatty liver disease, heart failure with preserved ejection fraction, hyperlipidemia, BPH, hypothyroidism does report a former tobacco use, quit over 40 years ago, family history noncontributory to orthopedic complaint, no surgery on his knees.  Physical exam: Vital signs are reviewed and are documented in the chart Gen.: Alert, oriented, appears stated age, in no apparent distress HEENT: Moist oral mucosa Respiratory: Normal respirations, able to speak in full sentences Cardiac: Regular rate, distal pulses 2+ Integumentary: No rashes on visible skin:  Neurologic: Strength 5/5, sensation 2+ in bilateral lower extremities Psych: Normal affect, mood  is described as good Musculoskeletal: Inspection of bilateral knees reveal no obvious deformity or muscle atrophy, minimal suprapatellar effusion bilaterally, no warmth, erythema, ecchymosis, he is tender to palpation of both medial and lateral joint line bilaterally, range of motion is from 0 to 130 bilaterally, no evidence of ligamentous instability  Procedure note: After written informed consent was obtained, outlining benefits and risks of procedure, with benefit being pain relief, risk being bleeding, infection, and steroid flare, he does consent to proceed with cortisone injection to right knee. Betadine was used to sterilize the skin, alcohol wise was additionally used to sterilize the skin. Ethyl chloride was used to anesthetize the skin. Using a 4 cc lidocaine and 2 cc DepoMedrol on 22 gauge needle, his right knee was injected from an inferolateral approach. Band-aid was applied after needle was removed. No complications noted from procedure.  Procedure note: After written informed consent was obtained, outlining benefits and risks of procedure, with benefit being pain relief, risk being bleeding, infection, and steroid flare, he does consent to proceed with cortisone injection to left knee. Betadine was used to sterilize the skin, alcohol wise was additionally used to sterilize the skin. Ethyl chloride was used to anesthetize the skin. Using a 4 cc lidocaine and 2 cc DepoMedrol on 22 gauge needle, his left knee was injected from an inferolateral approach. Band-aid was applied after needle was removed. No complications noted from procedure.    Assessment and plan: 1. Bilateral primary osteoarthritis of knees  Bilateral primary osteoarthritis of knees -Reviewed most recent x-ray of his left knee, which is back from October 2015, which does show primary end stage  medial compartment osteoarthritis -Discussed option of cortisone injection to both of his knees, which he does agree to today,  this was done without any complications noted  He will follow-up here on as-needed basis. Discussed option of viscosupplementation in the future, though he will need a follow-up with orthopedic office for this.  Mort Sawyers, M.D. [place]

## 2017-03-06 ENCOUNTER — Other Ambulatory Visit: Payer: Self-pay | Admitting: *Deleted

## 2017-03-06 NOTE — Patient Outreach (Signed)
[place] [facility]) Care Management  03/06/2017  [name] [name] [birth year] [id number]   I am familiar with [name] [name] who is a 76 year old male patient living alone in [place]. [name] [name] has a medical history which includes OSA (not on CPAP), hypothyroidism, Hypertension, hyperlipidemia, glaucoma, low back pain, chronic diastolic heart failure, and benign prostatic hypertrophy.   [name] [name] was admtitted to the hospital on 02/27/17 and discharged on 02/28/17 after admission for hypotension. [name] [name] was discharged with orders to follow up with his primary care provider. I reached out to the practice patient care coordinator to request assistance with scheduling a follow up appointment.   I was unable to reach [name] [name] by phone today and will return a call to him in the next 48 hours.    [place] Management  [phone]

## 2017-03-08 ENCOUNTER — Other Ambulatory Visit: Payer: Self-pay | Admitting: *Deleted

## 2017-03-08 NOTE — Patient Outreach (Signed)
[name] [name] [facility]) Care Management  03/08/2017  [name] [name] [birth year] [id number]  I am familiar with [name] [name] who is a 76 year old male patient living alone in [place]. [name] [name] has a medical history which includes OSA (not on CPAP), hypothyroidism, Hypertension, hyperlipidemia, glaucoma, low back pain, chronic diastolic heart failure, and benign prostatic hypertrophy.   [name] [name] was admitted to the hospital on 02/27/17 and discharged on 02/28/17 after admission for hypotension. I reached out to the patient care coordinator at the practice and requested a post hospital follow up appointment which [name] [name] tells me today he attended. He reports that Dr. Gerarda Fraction discontinued "all the blood pressure medicine" but he couldn't tell me specifically which medications were discontinued. [name] [name] says his son is managing his medications and helping monitor his daily bp and weight readings. He says he is taking all medications "exactly like the doctor ordered".   Today, [name] [name] reports that his blood pressure was 109/69 and his weight was 261lb. He denies signs or symptoms of hypotension and denies shortness of breath, chest pain, dizziness, lightheadedness, or other new or worsened symptom.   [name] [name] declines to allow me a visit to his home but says he would appreciate it if I called him again to check on his progress.   Plan: I will reach out to [name] [name] by phone again next week.    [place] Management  [phone]

## 2017-03-15 ENCOUNTER — Ambulatory Visit (INDEPENDENT_AMBULATORY_CARE_PROVIDER_SITE_OTHER): Payer: PPO | Admitting: Urology

## 2017-03-15 DIAGNOSIS — N401 Enlarged prostate with lower urinary tract symptoms: Secondary | ICD-10-CM | POA: Diagnosis not present

## 2017-03-15 DIAGNOSIS — N5201 Erectile dysfunction due to arterial insufficiency: Secondary | ICD-10-CM | POA: Diagnosis not present

## 2017-03-15 DIAGNOSIS — N183 Chronic kidney disease, stage 3 (moderate): Secondary | ICD-10-CM | POA: Diagnosis not present

## 2017-03-15 DIAGNOSIS — R351 Nocturia: Secondary | ICD-10-CM | POA: Diagnosis not present

## 2017-03-15 DIAGNOSIS — Z79899 Other long term (current) drug therapy: Secondary | ICD-10-CM | POA: Diagnosis not present

## 2017-03-15 DIAGNOSIS — R809 Proteinuria, unspecified: Secondary | ICD-10-CM | POA: Diagnosis not present

## 2017-03-15 DIAGNOSIS — D509 Iron deficiency anemia, unspecified: Secondary | ICD-10-CM | POA: Diagnosis not present

## 2017-03-15 DIAGNOSIS — E559 Vitamin D deficiency, unspecified: Secondary | ICD-10-CM | POA: Diagnosis not present

## 2017-03-15 DIAGNOSIS — I10 Essential (primary) hypertension: Secondary | ICD-10-CM | POA: Diagnosis not present

## 2017-03-21 DIAGNOSIS — I10 Essential (primary) hypertension: Secondary | ICD-10-CM | POA: Diagnosis not present

## 2017-03-21 DIAGNOSIS — D649 Anemia, unspecified: Secondary | ICD-10-CM | POA: Diagnosis not present

## 2017-03-21 DIAGNOSIS — I509 Heart failure, unspecified: Secondary | ICD-10-CM | POA: Diagnosis not present

## 2017-03-21 DIAGNOSIS — E876 Hypokalemia: Secondary | ICD-10-CM | POA: Diagnosis not present

## 2017-03-21 DIAGNOSIS — N182 Chronic kidney disease, stage 2 (mild): Secondary | ICD-10-CM | POA: Diagnosis not present

## 2017-03-21 DIAGNOSIS — G4733 Obstructive sleep apnea (adult) (pediatric): Secondary | ICD-10-CM | POA: Diagnosis not present

## 2017-04-06 ENCOUNTER — Ambulatory Visit: Payer: PPO | Admitting: Sports Medicine

## 2017-04-06 ENCOUNTER — Other Ambulatory Visit: Payer: Self-pay | Admitting: *Deleted

## 2017-04-06 ENCOUNTER — Encounter: Payer: Self-pay | Admitting: Sports Medicine

## 2017-04-06 DIAGNOSIS — M170 Bilateral primary osteoarthritis of knee: Secondary | ICD-10-CM | POA: Diagnosis not present

## 2017-04-06 MED ORDER — TRAMADOL HCL 50 MG PO TABS: 50.0000 mg | ORAL_TABLET | Freq: Two times a day (BID) | ORAL | 0 refills | Status: DC | PRN

## 2017-04-06 MED ORDER — DICLOFENAC SODIUM 1 % TD GEL: 4.0000 g | Freq: Four times a day (QID) | TRANSDERMAL | 3 refills | Status: AC | PRN

## 2017-04-06 NOTE — Assessment & Plan Note (Addendum)
Patient presenting with signs and symptoms consistent with bilateral knee primary osteoarthritis.  Right appears to be worse than the left (symptomatically).  Patient does not appear to be a surgical candidate secondary to his comorbidities.  Intra-articular steroid injections only seem to be beneficial on the left side. -X-rays deferred today as they are unlikely to change our treatment plan -Compression sleeve prescription provided today. -Encouraged ice and rest as needed. -Prescription for Voltaren gel provided today. -Prescription for tramadol to be taken only as needed, in place of oxycodone. -Encouraged continued activity as tolerated. -We discussed the possibility of Visco supplementation injections.  Patient was not overly interested at this time.   Next: Consider Visco-supplementation conversation again. Consider Nitro patches (if co-morbidities allow) >> if not then episodic icing regimen.  Of Note: Patient brought up some chronic back pain.  It may be worth looking into epidural steroid injections (if appropriate) as this may be an atypical contributing factor to his knee pain.

## 2017-04-06 NOTE — Progress Notes (Signed)
   HPI  CC: Bilateral knee pain Patient is here presenting with bilateral knee pain he states that he has had some improvement in the left knee after his corticosteroid injection.  Unfortunately the right knee did not have any improvement long-term.  He continues to have pain localized along the joint line on the inferior aspect of the patella.  He endorses crepitus but no true mechanical symptoms.  He also endorses some swelling.  He denies any recent injury, trauma, or fall.  No weakness, numbness, or paresthesias.  He does endorse some occasional feelings of instability and occasional giving out/weakness of the right knee.  Medications/Interventions Tried: Rest, ice, and injections (steroid)  See HPI and/or previous note for associated ROS.  Objective: BP (!) 158/92   Ht 5\' 6"  (1.676 m)   Wt 265 lb (120.2 kg)   BMI 42.77 kg/m  Gen: NAD, well groomed, a/o x3, normal affect.  CV: Well-perfused. Warm.  Resp: Non-labored.  Neuro: Sensation intact throughout. No gross coordination deficits.  Gait: Nonpathologic posture, unremarkable stride without signs of limp or balance issues. Knee, Right: TTP noted at the JL bilaterall and the inferior pole of the patella. Inspection was negative for erythema, ecchymosis, and effusion. Some JL osteophyte development. Palpation yielded no asymmetric warmth; No condyle tenderness; +2 patellar crepitus. Patellar and quadriceps tendons unremarkable, and no tenderness of the pes anserine bursa. No obvious Baker's cyst development. ROM slightly limited in flexion (120 degrees) and extension (5 degrees). Normal hamstring and quadriceps strength. Neurovascularly intact bilaterally.  - Ligaments: (Solid and consistent endpoints)   - ACL (present bilaterally)   - PCL (present bilaterally)   - LCL (present bilaterally)   - MCL (present bilaterally).   - Patella:   - Patellar grind/compression: NEG   - Patellar glide: Without apprehension   Assessment and  plan:  Bilateral primary osteoarthritis of knee Patient presenting with signs and symptoms consistent with bilateral knee primary osteoarthritis.  Right appears to be worse than the left (symptomatically).  Patient does not appear to be a surgical candidate secondary to his comorbidities.  Intra-articular steroid injections only seem to be beneficial on the left side. -X-rays deferred today as they are unlikely to change our treatment plan -Compression sleeve prescription provided today. -Encouraged ice and rest as needed. -Prescription for Voltaren gel provided today. -Prescription for tramadol to be taken only as needed, in place of oxycodone. -Encouraged continued activity as tolerated. -We discussed the possibility of Visco supplementation injections.  Patient was not overly interested at this time.   Next: Consider Visco-supplementation conversation again. Consider Nitro patches (if co-morbidities allow) >> if not then episodic icing regimen.  Of Note: Patient brought up some chronic back pain.  It may be worth looking into epidural steroid injections (if appropriate) as this may be an atypical contributing factor to his knee pain.   Meds ordered this encounter  Medications  . diclofenac sodium (VOLTAREN) 1 % GEL    Sig: Apply 4 g 4 (four) times daily as needed topically.    Dispense:  2 Tube    Refill:  3  . traMADol (ULTRAM) 50 MG tablet    Sig: Take 1 tablet (50 mg total) every 12 (twelve) hours as needed by mouth.    Dispense:  20 tablet    Refill:  0     Elberta Leatherwood, MD,MS [facility] 04/06/2017 9:43 AM

## 2017-04-07 NOTE — Patient Outreach (Signed)
[place] [facility]) Care Management  04/06/2017  Klever Twyford [birth year] [id number]  Unable to reach Mr. Ryder by phone. Left HIPPA compliant voice message requesting a return call.   Plan: I will reach out to Mr. Shaneyfelt next week by phone.    [place] Management  [phone]

## 2017-04-11 ENCOUNTER — Other Ambulatory Visit: Payer: Self-pay | Admitting: *Deleted

## 2017-04-11 NOTE — Patient Outreach (Signed)
[place] [facility]) Care Management  04/11/2017  [name] [name] 30-May-1940 [id number]  I was unable to reach [name] [name] by phone today. I left a HIPPA compliant voice message requesting a return call.   Plan: I will try to reach [name] [name] again once more by phone. If I am unable to reach him, I will send a letter inquiring about his ongoing interest in our support and services.    [place] Management  [phone]

## 2017-04-12 ENCOUNTER — Encounter: Payer: Self-pay | Admitting: Podiatry

## 2017-04-12 ENCOUNTER — Ambulatory Visit: Payer: PPO | Admitting: Podiatry

## 2017-04-12 DIAGNOSIS — B351 Tinea unguium: Secondary | ICD-10-CM

## 2017-04-12 DIAGNOSIS — I739 Peripheral vascular disease, unspecified: Secondary | ICD-10-CM

## 2017-04-12 DIAGNOSIS — M79676 Pain in unspecified toe(s): Secondary | ICD-10-CM

## 2017-04-12 DIAGNOSIS — M722 Plantar fascial fibromatosis: Secondary | ICD-10-CM

## 2017-04-12 NOTE — Progress Notes (Signed)
Complaint:  Visit Type: Patient returns to my office for continued preventative foot care services. Complaint: Patient states" my nails have grown long and thick and become painful to walk and wear shoes" . The patient presents for preventative foot care services. No changes to ROS  Podiatric Exam: Vascular: dorsalis pedis and posterior tibial pulses are palpable bilateral. Capillary return is immediate. Temperature gradient is WNL. Skin turgor WNL  Sensorium: Normal Semmes Weinstein monofilament test. Normal tactile sensation bilaterally. Nail Exam: Pt has thick disfigured discolored nails with subungual debris noted bilateral entire nail hallux through fifth toenails Ulcer Exam: There is no evidence of ulcer or pre-ulcerative changes or infection. Orthopedic Exam: Muscle tone and strength are WNL. No limitations in general ROM. No crepitus or effusions noted. Foot type and digits show no abnormalities. Bony prominences are unremarkable. Skin: No Porokeratosis. No infection or ulcers.  Fibroma right arch.  Diagnosis:  Onychomycosis, , Pain in right toe, pain in left toes  Treatment & Plan Procedures and Treatment: Consent by patient was obtained for treatment procedures.   Debridement of mycotic and hypertrophic toenails, 1 through 5 bilateral and clearing of subungual debris. No ulceration, no infection noted.  Return Visit-Office Procedure: Patient instructed to return to the office for a follow up visit 3 months for continued evaluation and treatment.    Gardiner Barefoot DPM

## 2017-04-19 DIAGNOSIS — I10 Essential (primary) hypertension: Secondary | ICD-10-CM | POA: Diagnosis not present

## 2017-04-19 DIAGNOSIS — Z79899 Other long term (current) drug therapy: Secondary | ICD-10-CM | POA: Diagnosis not present

## 2017-04-19 DIAGNOSIS — E875 Hyperkalemia: Secondary | ICD-10-CM | POA: Diagnosis not present

## 2017-04-20 NOTE — Patient Outreach (Signed)
CSW completed documentation correction on client on 04/20/17.

## 2017-04-21 ENCOUNTER — Other Ambulatory Visit: Payer: Self-pay | Admitting: *Deleted

## 2017-04-21 NOTE — Patient Outreach (Signed)
[address] [facility]) Care Management  04/21/2017  Grant Swager 20-Jul-1940 [id number]  I have only been able to reach Mr. Hevener on one of 5 telephone outreach attempts. We are very happy to address any case management needs with Mr. Welchel if he wishes to work with [name]. I sent a letter to his home today outlining the services we are happy to offer him and inquiring about his interest. I will wait 10 days for a response from Mr. Egnew. If I do not hear from him I will close his case but will be available and happy to assist with any case management and/or care coordination needs he may have should he wish to engage with [name] in the future.    [place] Management  [phone]

## 2017-05-05 ENCOUNTER — Other Ambulatory Visit: Payer: Self-pay | Admitting: *Deleted

## 2017-05-05 NOTE — Patient Outreach (Signed)
[place] [facility]) Care Management  05/05/2017  [name] [name] Aug 19, 1940 [id number]  Unable to maintain contact with patient. Case Closed.    [place] Management  [phone]

## 2017-05-08 ENCOUNTER — Encounter: Payer: Self-pay | Admitting: Sports Medicine

## 2017-05-08 ENCOUNTER — Ambulatory Visit: Payer: PPO | Admitting: Sports Medicine

## 2017-05-08 VITALS — BP 147/91 | Ht 66.0 in | Wt 265.0 lb

## 2017-05-08 DIAGNOSIS — M1711 Unilateral primary osteoarthritis, right knee: Secondary | ICD-10-CM

## 2017-05-08 MED ORDER — METHYLPREDNISOLONE ACETATE 40 MG/ML IJ SUSP
40.0000 mg | Freq: Once | INTRAMUSCULAR | Status: AC
  Administered 2017-05-08: 40 mg via INTRA_ARTICULAR

## 2017-05-08 NOTE — Progress Notes (Signed)
   Subjective:    Patient ID: [name] [name], male    DOB: 04/07/1941, 76 y.o.   MRN: [id number]  HPI   Patient comes in today for follow-up on bilateral knee pain. Left knee pain is much better. Right knee pain persists. He currently takes OxyContin and Tylenol No. 3. These are only somewhat helpful. I injected both of his knees recently with cortisone. Left knee responded quite nicely but the right knee pain still persists. Tramadol has been ineffective. Compression sleeve has been ineffective. Main complaint is a feeling of "crunching and popping" in his medial compartment. He is here today with his son.   Review of Systems    as above Objective:   Physical Exam  Well-developed, obese. No acute distress. Awake alert and oriented 3. Vital signs reviewed  Right knee: Range of motion 0-100. No obvious effusion. He is tender to palpation along the medial joint line with pain but no popping with McMurray's. Joint is grossly stable to ligamentous exam. Neurovascularly intact distally. Walking with the assistance of a cane.  Left knee: Range of motion 0-120. No effusion. No tenderness to palpation along medial or lateral joint lines. Good ligament stability. Neurovascularly intact distally.      Assessment & Plan:   Persistent right knee pain secondary to DJD  I discussed treatment options with the patient and his son. He is not a surgical candidate so total knee arthroplasty is not option. I discussed trying a repeat cortisone injection versus Visco supplementation. They've elected to try a repeat cortisone injection. I accomplished this today atraumatically under sterile technique utilizing an anterior lateral approach. Patient tolerated this without difficulty. If his symptoms persist then he or his son will notify me and I will refer them for possible Visco supplementation. Follow-up as needed.  Consent obtained and verified. Time-out conducted. Noted no overlying erythema,  induration, or other signs of local infection. Skin prepped in a sterile fashion. Topical analgesic spray: Ethyl chloride. Joint: right knee Needle: 22g 1.5 inch Completed without difficulty. Meds: 3cc 1% xylocaine, 1cc (40mg ) depomedrol  Advised to call if fevers/chills, erythema, induration, drainage, or persistent bleeding.

## 2017-05-22 DIAGNOSIS — I10 Essential (primary) hypertension: Secondary | ICD-10-CM | POA: Diagnosis not present

## 2017-05-22 DIAGNOSIS — M1991 Primary osteoarthritis, unspecified site: Secondary | ICD-10-CM | POA: Diagnosis not present

## 2017-05-22 DIAGNOSIS — Z6841 Body Mass Index (BMI) 40.0 and over, adult: Secondary | ICD-10-CM | POA: Diagnosis not present

## 2017-05-22 DIAGNOSIS — G894 Chronic pain syndrome: Secondary | ICD-10-CM | POA: Diagnosis not present

## 2017-05-22 DIAGNOSIS — E1140 Type 2 diabetes mellitus with diabetic neuropathy, unspecified: Secondary | ICD-10-CM | POA: Diagnosis not present

## 2017-05-22 DIAGNOSIS — E669 Obesity, unspecified: Secondary | ICD-10-CM | POA: Diagnosis not present

## 2017-05-27 ENCOUNTER — Other Ambulatory Visit: Payer: Self-pay

## 2017-05-27 ENCOUNTER — Emergency Department (HOSPITAL_COMMUNITY): Payer: Medicare HMO

## 2017-05-27 ENCOUNTER — Inpatient Hospital Stay (HOSPITAL_COMMUNITY)
Admission: EM | Admit: 2017-05-27 | Discharge: 2017-06-01 | DRG: 689 | Disposition: A | Discharge status: Home Health | Payer: Medicare HMO | Attending: Internal Medicine | Admitting: Internal Medicine

## 2017-05-27 ENCOUNTER — Encounter (HOSPITAL_COMMUNITY): Payer: Self-pay

## 2017-05-27 DIAGNOSIS — N390 Urinary tract infection, site not specified: Secondary | ICD-10-CM | POA: Diagnosis not present

## 2017-05-27 DIAGNOSIS — Z9079 Acquired absence of other genital organ(s): Secondary | ICD-10-CM

## 2017-05-27 DIAGNOSIS — D696 Thrombocytopenia, unspecified: Secondary | ICD-10-CM | POA: Diagnosis present

## 2017-05-27 DIAGNOSIS — K5793 Diverticulitis of intestine, part unspecified, without perforation or abscess with bleeding: Secondary | ICD-10-CM

## 2017-05-27 DIAGNOSIS — R1900 Intra-abdominal and pelvic swelling, mass and lump, unspecified site: Secondary | ICD-10-CM | POA: Diagnosis not present

## 2017-05-27 DIAGNOSIS — I2510 Atherosclerotic heart disease of native coronary artery without angina pectoris: Secondary | ICD-10-CM | POA: Diagnosis present

## 2017-05-27 DIAGNOSIS — R1314 Dysphagia, pharyngoesophageal phase: Secondary | ICD-10-CM | POA: Diagnosis present

## 2017-05-27 DIAGNOSIS — Z981 Arthrodesis status: Secondary | ICD-10-CM

## 2017-05-27 DIAGNOSIS — I5189 Other ill-defined heart diseases: Secondary | ICD-10-CM | POA: Diagnosis present

## 2017-05-27 DIAGNOSIS — Z87891 Personal history of nicotine dependence: Secondary | ICD-10-CM

## 2017-05-27 DIAGNOSIS — I519 Heart disease, unspecified: Secondary | ICD-10-CM

## 2017-05-27 DIAGNOSIS — Z9189 Other specified personal risk factors, not elsewhere classified: Secondary | ICD-10-CM

## 2017-05-27 DIAGNOSIS — N410 Acute prostatitis: Secondary | ICD-10-CM | POA: Diagnosis present

## 2017-05-27 DIAGNOSIS — K5732 Diverticulitis of large intestine without perforation or abscess without bleeding: Secondary | ICD-10-CM | POA: Diagnosis not present

## 2017-05-27 DIAGNOSIS — D72829 Elevated white blood cell count, unspecified: Secondary | ICD-10-CM

## 2017-05-27 DIAGNOSIS — H409 Unspecified glaucoma: Secondary | ICD-10-CM | POA: Diagnosis present

## 2017-05-27 DIAGNOSIS — K279 Peptic ulcer, site unspecified, unspecified as acute or chronic, without hemorrhage or perforation: Secondary | ICD-10-CM | POA: Diagnosis present

## 2017-05-27 DIAGNOSIS — G8929 Other chronic pain: Secondary | ICD-10-CM | POA: Diagnosis present

## 2017-05-27 DIAGNOSIS — E611 Iron deficiency: Secondary | ICD-10-CM | POA: Diagnosis present

## 2017-05-27 DIAGNOSIS — G4733 Obstructive sleep apnea (adult) (pediatric): Secondary | ICD-10-CM | POA: Diagnosis present

## 2017-05-27 DIAGNOSIS — Z9049 Acquired absence of other specified parts of digestive tract: Secondary | ICD-10-CM

## 2017-05-27 DIAGNOSIS — M359 Systemic involvement of connective tissue, unspecified: Secondary | ICD-10-CM | POA: Diagnosis present

## 2017-05-27 DIAGNOSIS — Z66 Do not resuscitate: Secondary | ICD-10-CM | POA: Diagnosis not present

## 2017-05-27 DIAGNOSIS — I10 Essential (primary) hypertension: Secondary | ICD-10-CM | POA: Diagnosis present

## 2017-05-27 DIAGNOSIS — R161 Splenomegaly, not elsewhere classified: Secondary | ICD-10-CM | POA: Diagnosis not present

## 2017-05-27 DIAGNOSIS — K922 Gastrointestinal hemorrhage, unspecified: Secondary | ICD-10-CM | POA: Diagnosis not present

## 2017-05-27 DIAGNOSIS — Z79899 Other long term (current) drug therapy: Secondary | ICD-10-CM

## 2017-05-27 DIAGNOSIS — Z888 Allergy status to other drugs, medicaments and biological substances status: Secondary | ICD-10-CM

## 2017-05-27 DIAGNOSIS — R195 Other fecal abnormalities: Secondary | ICD-10-CM | POA: Diagnosis present

## 2017-05-27 DIAGNOSIS — N419 Inflammatory disease of prostate, unspecified: Secondary | ICD-10-CM | POA: Diagnosis present

## 2017-05-27 DIAGNOSIS — R1909 Other intra-abdominal and pelvic swelling, mass and lump: Secondary | ICD-10-CM | POA: Diagnosis not present

## 2017-05-27 DIAGNOSIS — Z860100 Personal history of colon polyps, unspecified: Secondary | ICD-10-CM

## 2017-05-27 DIAGNOSIS — E785 Hyperlipidemia, unspecified: Secondary | ICD-10-CM | POA: Diagnosis present

## 2017-05-27 DIAGNOSIS — E860 Dehydration: Secondary | ICD-10-CM | POA: Diagnosis not present

## 2017-05-27 DIAGNOSIS — K2970 Gastritis, unspecified, without bleeding: Secondary | ICD-10-CM | POA: Diagnosis present

## 2017-05-27 DIAGNOSIS — E039 Hypothyroidism, unspecified: Secondary | ICD-10-CM | POA: Diagnosis not present

## 2017-05-27 DIAGNOSIS — I5032 Chronic diastolic (congestive) heart failure: Secondary | ICD-10-CM | POA: Diagnosis present

## 2017-05-27 DIAGNOSIS — M545 Low back pain: Secondary | ICD-10-CM | POA: Diagnosis present

## 2017-05-27 DIAGNOSIS — K5733 Diverticulitis of large intestine without perforation or abscess with bleeding: Secondary | ICD-10-CM | POA: Diagnosis not present

## 2017-05-27 DIAGNOSIS — K5792 Diverticulitis of intestine, part unspecified, without perforation or abscess without bleeding: Secondary | ICD-10-CM | POA: Diagnosis present

## 2017-05-27 DIAGNOSIS — K219 Gastro-esophageal reflux disease without esophagitis: Secondary | ICD-10-CM | POA: Diagnosis not present

## 2017-05-27 DIAGNOSIS — Z9889 Other specified postprocedural states: Secondary | ICD-10-CM

## 2017-05-27 DIAGNOSIS — N3000 Acute cystitis without hematuria: Secondary | ICD-10-CM | POA: Diagnosis not present

## 2017-05-27 DIAGNOSIS — R972 Elevated prostate specific antigen [PSA]: Secondary | ICD-10-CM | POA: Diagnosis not present

## 2017-05-27 DIAGNOSIS — E782 Mixed hyperlipidemia: Secondary | ICD-10-CM | POA: Diagnosis not present

## 2017-05-27 DIAGNOSIS — I110 Hypertensive heart disease with heart failure: Secondary | ICD-10-CM | POA: Diagnosis not present

## 2017-05-27 DIAGNOSIS — B964 Proteus (mirabilis) (morganii) as the cause of diseases classified elsewhere: Secondary | ICD-10-CM | POA: Diagnosis not present

## 2017-05-27 DIAGNOSIS — Z7902 Long term (current) use of antithrombotics/antiplatelets: Secondary | ICD-10-CM

## 2017-05-27 DIAGNOSIS — Z8719 Personal history of other diseases of the digestive system: Secondary | ICD-10-CM

## 2017-05-27 DIAGNOSIS — K921 Melena: Secondary | ICD-10-CM | POA: Diagnosis present

## 2017-05-27 DIAGNOSIS — R351 Nocturia: Secondary | ICD-10-CM | POA: Diagnosis present

## 2017-05-27 DIAGNOSIS — I872 Venous insufficiency (chronic) (peripheral): Secondary | ICD-10-CM | POA: Diagnosis present

## 2017-05-27 DIAGNOSIS — N401 Enlarged prostate with lower urinary tract symptoms: Secondary | ICD-10-CM | POA: Diagnosis present

## 2017-05-27 DIAGNOSIS — K625 Hemorrhage of anus and rectum: Secondary | ICD-10-CM | POA: Diagnosis present

## 2017-05-27 DIAGNOSIS — J9811 Atelectasis: Secondary | ICD-10-CM | POA: Diagnosis not present

## 2017-05-27 DIAGNOSIS — Z886 Allergy status to analgesic agent status: Secondary | ICD-10-CM

## 2017-05-27 LAB — COMPREHENSIVE METABOLIC PANEL
ALBUMIN: 3.3 g/dL — AB (ref 3.5–5.0)
ALK PHOS: 62 U/L (ref 38–126)
ALT: 11 U/L — AB (ref 17–63)
ANION GAP: 8 (ref 5–15)
AST: 15 U/L (ref 15–41)
BUN: 10 mg/dL (ref 6–20)
CALCIUM: 9.4 mg/dL (ref 8.9–10.3)
CO2: 25 mmol/L (ref 22–32)
Chloride: 105 mmol/L (ref 101–111)
Creatinine, Ser: 0.95 mg/dL (ref 0.61–1.24)
GFR calc Af Amer: >60 mL/min (ref >60)
GFR calc non Af Amer: >60 mL/min (ref >60)
GLUCOSE: 115 mg/dL — AB (ref 65–99)
Potassium: 3.7 mmol/L (ref 3.5–5.1)
SODIUM: 138 mmol/L (ref 135–145)
Total Bilirubin: 0.9 mg/dL (ref 0.3–1.2)
Total Protein: 6.3 g/dL — ABNORMAL LOW (ref 6.5–8.1)

## 2017-05-27 LAB — CBC WITH DIFFERENTIAL/PLATELET
BASOS ABS: 0 10*3/uL (ref 0.0–0.1)
BASOS PCT: 0 %
EOS ABS: 0.1 10*3/uL (ref 0.0–0.7)
Eosinophils Relative: 1 %
HEMATOCRIT: 43.7 % (ref 39.0–52.0)
HEMOGLOBIN: 14.6 g/dL (ref 13.0–17.0)
Lymphocytes Relative: 12 %
Lymphs Abs: 1.4 10*3/uL (ref 0.7–4.0)
MCH: 26.4 pg (ref 26.0–34.0)
MCHC: 33.4 g/dL (ref 30.0–36.0)
MCV: 79.2 fL (ref 78.0–100.0)
Monocytes Absolute: 0.3 10*3/uL (ref 0.1–1.0)
Monocytes Relative: 2 %
NEUTROS ABS: 10.3 10*3/uL — AB (ref 1.7–7.7)
NEUTROS PCT: 85 %
Platelets: 115 10*3/uL — ABNORMAL LOW (ref 150–400)
RBC: 5.52 MIL/uL (ref 4.22–5.81)
RDW: 16.4 % — ABNORMAL HIGH (ref 11.5–15.5)
WBC: 12.1 10*3/uL — AB (ref 4.0–10.5)

## 2017-05-27 LAB — SAMPLE TO BLOOD BANK

## 2017-05-27 LAB — URINALYSIS, ROUTINE W REFLEX MICROSCOPIC
BILIRUBIN URINE: NEGATIVE
Glucose, UA: NEGATIVE mg/dL
Ketones, ur: NEGATIVE mg/dL
Nitrite: NEGATIVE
Protein, ur: 30 mg/dL — AB
SPECIFIC GRAVITY, URINE: 1.009 (ref 1.005–1.030)
pH: 6 (ref 5.0–8.0)

## 2017-05-27 LAB — POC OCCULT BLOOD, ED: Fecal Occult Bld: POSITIVE — AB

## 2017-05-27 LAB — TSH: TSH: 3.865 u[IU]/mL (ref 0.350–4.500)

## 2017-05-27 LAB — PSA: PROSTATIC SPECIFIC ANTIGEN: 9.36 ng/mL — AB (ref 0.00–4.00)

## 2017-05-27 MED ORDER — BISACODYL 5 MG PO TBEC
5.0000 mg | DELAYED_RELEASE_TABLET | Freq: Every day | ORAL | Status: DC
  Administered 2017-05-27: 5 mg via ORAL
  Filled 2017-05-27: qty 1

## 2017-05-27 MED ORDER — BRIMONIDINE TARTRATE 0.2 % OP SOLN
1.0000 [drp] | Freq: Two times a day (BID) | OPHTHALMIC | Status: DC
  Administered 2017-05-27 – 2017-06-01 (×10): 1 [drp] via OPHTHALMIC

## 2017-05-27 MED ORDER — DEXTROSE 5 % IV SOLN
1.0000 g | Freq: Once | INTRAVENOUS | Status: AC
  Administered 2017-05-27: 1 g via INTRAVENOUS
  Filled 2017-05-27: qty 10

## 2017-05-27 MED ORDER — CALCIUM CARBONATE 1250 (500 CA) MG PO TABS
250.0000 mg | ORAL_TABLET | ORAL | Status: DC
  Administered 2017-05-29 – 2017-05-31 (×2): 250 mg via ORAL
  Filled 2017-05-27 (×2): qty 1

## 2017-05-27 MED ORDER — FERROUS SULFATE 325 (65 FE) MG PO TABS
325.0000 mg | ORAL_TABLET | Freq: Every day | ORAL | Status: DC
  Administered 2017-05-28 – 2017-06-01 (×5): 325 mg via ORAL
  Filled 2017-05-27 (×5): qty 1

## 2017-05-27 MED ORDER — TRAZODONE HCL 50 MG PO TABS
50.0000 mg | ORAL_TABLET | Freq: Every evening | ORAL | Status: DC | PRN
  Administered 2017-05-27 – 2017-05-30 (×3): 50 mg via ORAL
  Filled 2017-05-27 (×3): qty 1

## 2017-05-27 MED ORDER — ACETAMINOPHEN 325 MG PO TABS
650.0000 mg | ORAL_TABLET | Freq: Four times a day (QID) | ORAL | Status: DC | PRN
  Administered 2017-05-30 – 2017-06-01 (×2): 650 mg via ORAL
  Filled 2017-05-27 (×2): qty 2

## 2017-05-27 MED ORDER — METRONIDAZOLE IN NACL 5-0.79 MG/ML-% IV SOLN
500.0000 mg | Freq: Three times a day (TID) | INTRAVENOUS | Status: DC
  Administered 2017-05-27 – 2017-06-01 (×14): 500 mg via INTRAVENOUS
  Filled 2017-05-27 (×14): qty 100

## 2017-05-27 MED ORDER — POTASSIUM CHLORIDE CRYS ER 20 MEQ PO TBCR
40.0000 meq | EXTENDED_RELEASE_TABLET | Freq: Two times a day (BID) | ORAL | Status: AC
  Administered 2017-05-27 – 2017-05-30 (×6): 40 meq via ORAL
  Filled 2017-05-27 (×6): qty 2

## 2017-05-27 MED ORDER — ONDANSETRON HCL 4 MG/2ML IJ SOLN
4.0000 mg | Freq: Four times a day (QID) | INTRAMUSCULAR | Status: DC | PRN
  Administered 2017-05-28 – 2017-05-30 (×2): 4 mg via INTRAVENOUS
  Filled 2017-05-27 (×2): qty 2

## 2017-05-27 MED ORDER — SODIUM CHLORIDE 0.9 % IV SOLN
INTRAVENOUS | Status: AC
  Administered 2017-05-27 – 2017-05-28 (×2): via INTRAVENOUS

## 2017-05-27 MED ORDER — ACETAMINOPHEN 650 MG RE SUPP: 650.0000 mg | Freq: Four times a day (QID) | RECTAL | Status: DC | PRN

## 2017-05-27 MED ORDER — DORZOLAMIDE HCL-TIMOLOL MAL 2-0.5 % OP SOLN
1.0000 [drp] | Freq: Two times a day (BID) | OPHTHALMIC | Status: DC
  Administered 2017-05-27 – 2017-06-01 (×10): 1 [drp] via OPHTHALMIC
  Filled 2017-05-27: qty 10

## 2017-05-27 MED ORDER — ONDANSETRON HCL 4 MG/2ML IJ SOLN
4.0000 mg | Freq: Once | INTRAMUSCULAR | Status: AC
  Administered 2017-05-27: 4 mg via INTRAVENOUS
  Filled 2017-05-27: qty 2

## 2017-05-27 MED ORDER — POLYVINYL ALCOHOL 1.4 % OP SOLN: 1.0000 [drp] | Freq: Every day | OPHTHALMIC | Status: DC | PRN

## 2017-05-27 MED ORDER — TAMSULOSIN HCL 0.4 MG PO CAPS
0.4000 mg | ORAL_CAPSULE | Freq: Every day | ORAL | Status: DC
  Administered 2017-05-27 – 2017-05-31 (×5): 0.4 mg via ORAL
  Filled 2017-05-27 (×5): qty 1

## 2017-05-27 MED ORDER — ATORVASTATIN CALCIUM 40 MG PO TABS
40.0000 mg | ORAL_TABLET | Freq: Every day | ORAL | Status: DC
  Administered 2017-05-30: 40 mg via ORAL
  Filled 2017-05-27 (×3): qty 1

## 2017-05-27 MED ORDER — ONDANSETRON HCL 4 MG PO TABS
4.0000 mg | ORAL_TABLET | Freq: Four times a day (QID) | ORAL | Status: DC | PRN
  Administered 2017-05-28 – 2017-06-01 (×3): 4 mg via ORAL
  Filled 2017-05-27 (×3): qty 1

## 2017-05-27 MED ORDER — METHOCARBAMOL 500 MG PO TABS
500.0000 mg | ORAL_TABLET | Freq: Two times a day (BID) | ORAL | Status: DC
  Administered 2017-05-27 – 2017-06-01 (×10): 500 mg via ORAL
  Filled 2017-05-27 (×10): qty 1

## 2017-05-27 MED ORDER — LEVOFLOXACIN IN D5W 750 MG/150ML IV SOLN
750.0000 mg | INTRAVENOUS | Status: DC
  Administered 2017-05-27 – 2017-05-30 (×4): 750 mg via INTRAVENOUS
  Filled 2017-05-27 (×4): qty 150

## 2017-05-27 MED ORDER — LATANOPROST 0.005 % OP SOLN
1.0000 [drp] | Freq: Every day | OPHTHALMIC | Status: DC
  Administered 2017-05-27 – 2017-05-31 (×5): 1 [drp] via OPHTHALMIC
  Filled 2017-05-27 (×2): qty 2.5

## 2017-05-27 MED ORDER — OXYCODONE HCL 5 MG PO TABS
5.0000 mg | ORAL_TABLET | ORAL | Status: DC | PRN
  Administered 2017-05-27 – 2017-06-01 (×7): 5 mg via ORAL
  Filled 2017-05-27 (×7): qty 1

## 2017-05-27 MED ORDER — MAGNESIUM OXIDE 400 (241.3 MG) MG PO TABS
400.0000 mg | ORAL_TABLET | ORAL | Status: DC
  Administered 2017-05-29 – 2017-06-01 (×2): 400 mg via ORAL
  Filled 2017-05-27 (×2): qty 1

## 2017-05-27 MED ORDER — MORPHINE SULFATE (PF) 4 MG/ML IV SOLN
4.0000 mg | Freq: Once | INTRAVENOUS | Status: AC
  Administered 2017-05-27: 4 mg via INTRAVENOUS
  Filled 2017-05-27: qty 1

## 2017-05-27 MED ORDER — IOPAMIDOL (ISOVUE-300) INJECTION 61%
100.0000 mL | Freq: Once | INTRAVENOUS | Status: AC | PRN
  Administered 2017-05-27: 100 mL via INTRAVENOUS

## 2017-05-27 NOTE — ED Triage Notes (Signed)
Pt reports dysuria and foul smelling urine for the past 2 days.   Reports  This morning started having nausea and had a black tarry stool.

## 2017-05-27 NOTE — H&P (Signed)
History and Physical  [name] [name]. HYW:[id number] DOB: 1940-08-27 DOA: 05/27/2017  Referring physician: Lily Kocher PCP: Redmond School, MD   Chief Complaint: Foul smelling urine   HPI: [name] [name]. is a 77 y.o. male with BPH and history of rectal bleeding, GERD, OSA, BPH, chronic diastolic heart failure, glaucoma, multiple colonic polyps and colonic diverticulosis, HTN and hypothyroidism presents to ED because he has noticed foul smelling urine for the past 2 days.   He developed nausea this morning, no emesis and had a black tarry stool.  He reportedly had a colonoscopy in 2016 by Dr. Sydell Axon and noted to have multiple colon polyps and diverticulosis and is due for repeat colonoscopy in Jan 2021.  The patient reports that he became concerned because of a foul smelling urine.  He also reports increasing urinary frequency and uncomfortable nocturia.  He reports that he has been urinating at least 5 times per night.  He has not been able to sleep well in the last 2 days.  The patient reports pain in the groin area.  He reports burning with urination.  He denies any blood in the urine.  ED Course:   The patient was noted to have a foul-smelling urine.  The microscopy of the urine revealed numerous white blood cells and signs of infection.  The patient also had a CT of the abdomen that was significant for inflammation of the cecum and terminal ileum concerning for diverticulitis.  There was also some concern for a pelvic mass like structure that will need further evaluation.  In addition, he was noted to have a leukocytosis.  He was noted to be Hemoccult positive with a very tender inflamed and enlarged prostate.  Review of Systems: All systems reviewed and apart from history of presenting illness, are negative.  Past Medical History:  Diagnosis Date  . Assistance needed for mobility    cane, walker  . Bilateral lower leg cellulitis   . BPH (benign prostatic hyperplasia)   .  Cataract    left  . Chronic diastolic HF (heart failure) ([place])   . Chronic low back pain   . Collagen vascular disease ([place])    history of venous insufficency and LE cellulitis  . Complication of anesthesia    2012 due to sleep apnea pt has had difficulty being put under  and waking up from anesthesia  . Deep venous insufficiency   . GERD (gastroesophageal reflux disease)   . Glaucoma    left  . Headache   . Heel spur    right heel  . History of bladder infections   . History of colonic polyps   . Hyperlipidemia   . Hypertension    Sees Dr. Debara Pickett  . Hypothyroidism    hypothyroid denies no rx  . Iron deficiency 07/07/2016  . Kidney stones    passed them  . Obesity   . Osteoarthritis   . PONV (postoperative nausea and vomiting)   . PUD (peptic ulcer disease)   . Ringing in ears   . S/P colonoscopy 2007, Feb and March 2012   hx of adenomas, left-sided diverticula, On 07/2010 TCS, fresh blood and clot noted coming from TI.  . S/P endoscopy 2007, 2012   2007: nl, May 2012: antral erosions  . SIRS (systemic inflammatory response syndrome) ([place]) 02/27/2012  . Sleep apnea    supposed to see Dr. Luan Pulling to get it started back, hasn't been on in since 2018  . Thrombocytopenia (  [place]) 12/30/2011   Stable   Past Surgical History:  Procedure Laterality Date  . BACK SURGERY    . CARDIAC CATHETERIZATION  02/27/2007   no sign of CAD, LVH, mod pulm HTN (Dr. Jackie Plum(  . CATARACT EXTRACTION W/ INTRAOCULAR LENS IMPLANT Right ~ 2012  . COLONOSCOPY N/A 05/28/2014   PIR:JJOACZYS colonic polyps/colonic diverticulosis. Tubular adenomas. Next colonoscopy January 2021  . ESOPHAGOGASTRODUODENOSCOPY  Aug 2013   [facility], single 9mm pedunculated polyp otherwise normal. HYPERPLASTIC, NEGATIVE h.pylori  . ESOPHAGOGASTRODUODENOSCOPY N/A 05/28/2014   RMR:non-critical Schatzki's ring/HH. Benign gastritis  . INGUINAL HERNIA REPAIR Right 1941  . KNEE ARTHROSCOPY Left ~ 2000  . LAPAROSCOPIC CHOLECYSTECTOMY    .  [place] SURGERY  2012  . MALONEY DILATION N/A 05/28/2014   Procedure: Venia Minks DILATION;  Surgeon: Daneil Dolin, MD;  [facility];  Service: Endoscopy;  Laterality: N/A;  . NASAL SEPTOPLASTY W/ TURBINOPLASTY Bilateral 10/07/2015   Procedure: NASAL SEPTOPLASTY WITH TURBINATE REDUCTION;  Surgeon: Leta Baptist, MD;  Location: [facility];  Service: ENT;  Laterality: Bilateral;  . NASAL SEPTUM SURGERY Bilateral 10/07/2015    inferior turbinate resection  . NM MYOCAR PERF WALL MOTION  12/2006   dipyridamole myoview - stress images show mild perfusion defect in mid inferior walls with reversibility at rest; mild perfusion defect in mid inferolateral wall at stress with mild defect reversibility, EF 62%, abnormal but low risk study   . POSTERIOR LUMBAR FUSION  2015  . SAVORY DILATION N/A 05/28/2014   Procedure: SAVORY DILATION;  Surgeon: Daneil Dolin, MD;  Location: [facility];  Service: Endoscopy;  Laterality: N/A;  . SHOULDER OPEN ROTATOR CUFF REPAIR Left early 2000s  . small bowel capsule study  07/2011   ?transient focal ischemia in distal small bowel to explain GI bleeding  . TRANSTHORACIC ECHOCARDIOGRAM  09/2008   EF 60-65%, mod conc LVH  . TRANSURETHRAL RESECTION OF PROSTATE    . TRANSURETHRAL RESECTION OF PROSTATE N/A 06/18/2013   Procedure: TRANSURETHRAL RESECTION OF THE PROSTATE (TURP);  Surgeon: Marissa Nestle, MD;  Location: [facility];  Service: Urology;  Laterality: N/A;   Social History:  reports that he quit smoking about 43 years ago. His smoking use included cigars. He started smoking about 57 years ago. He quit after 14.00 years of use. he has never used smokeless tobacco. He reports that he does not drink alcohol or use drugs.  Allergies  Allergen Reactions  . Aspirin Other (See Comments)    Nausea and upset stomach    . Nexium [Esomeprazole Magnesium] Other (See Comments)    Patient said it messed his stomach up    Family History  Problem Relation Age of Onset  . Colon  cancer Father 33       deceased  . Prostate cancer Father   . Pancreatic cancer Mother 91       deceased  . Prostate cancer Brother   . Arthritis Son   . Arthritis Son   . Diabetes Mellitus II Son     Prior to Admission medications   Medication Sig Start Date End Date Taking? Authorizing Provider  bisacodyl (DULCOLAX) 5 MG EC tablet Take 1 tablet (5 mg total) by mouth daily. 03/26/16  Yes Thurnell Lose, MD  brimonidine (ALPHAGAN) 0.2 % ophthalmic solution Place 1 drop into both eyes 2 (two) times daily.   Yes [provider]  calcium citrate (CALCITRATE - DOSED IN MG ELEMENTAL CALCIUM) 950 MG tablet Take 200  mg of elemental calcium by mouth daily. Takes 3 times per week   Yes [provider]  Cholecalciferol (VITAMIN D3) 1000 units CAPS Take 1 capsule by mouth. Takes 3 times per week   Yes [provider]  clopidogrel (PLAVIX) 75 MG tablet Take 1 tablet (75 mg total) by mouth daily. 11/10/16  Yes Lendon Colonel, NP  diclofenac sodium (VOLTAREN) 1 % GEL Apply 4 g 4 (four) times daily as needed topically. 04/06/17  Yes McKeag, Marylynn Pearson, MD  dorzolamide-timolol (COSOPT) 22.3-6.8 MG/ML ophthalmic solution Place 1 drop into both eyes 2 (two) times daily. 12/17/14  Yes [provider]  ferrous sulfate 325 (65 FE) MG tablet Take 325 mg by mouth daily with breakfast.   Yes [provider]  latanoprost (XALATAN) 0.005 % ophthalmic solution Place 1 drop into both eyes at bedtime.   Yes [provider]  magnesium oxide (MAG-OX) 400 MG tablet Take 400 mg by mouth daily. Takes 2 times per week   Yes [provider]  Menthol, Topical Analgesic, (BIOFREEZE) 4 % GEL To be used by therapy dept as needed   Yes [provider]  methocarbamol (ROBAXIN) 500 MG tablet Take 1 tablet (500 mg total) by mouth 2 (two) times daily. 10/24/16  Yes Francine Graven, DO  Oxycodone HCl 10 MG TABS Take 1 tablet (10 mg total) by mouth 2 (two) times  daily as needed (pain). 09/16/16  Yes Reed, Tiffany L, DO  Polyethyl Glycol-Propyl Glycol (LUBRICANT EYE DROPS) 0.4-0.3 % SOLN Place 1 drop into both eyes daily as needed (for lubricant eye drops).    Yes [provider]  potassium chloride (K-DUR,KLOR-CON) 10 MEQ tablet Take 40 mEq by mouth 2 (two) times daily.    Yes [provider]  tamsulosin (FLOMAX) 0.4 MG CAPS capsule Take 0.4 mg by mouth daily after supper.   Yes [provider]  traMADol (ULTRAM) 50 MG tablet Take 1 tablet (50 mg total) every 12 (twelve) hours as needed by mouth. 04/06/17  Yes McKeag, Marylynn Pearson, MD  atorvastatin (LIPITOR) 40 MG tablet Take 1 tablet (40 mg total) by mouth daily at 6 PM. Patient not taking: Reported on 05/27/2017 11/10/16   Lendon Colonel, NP   Physical Exam: Vitals:   05/27/17 1100 05/27/17 1145 05/27/17 1351 05/27/17 1400  BP: (!) 144/80  137/83 138/82  Pulse: 90 80 75 71  Resp:      Temp:      TempSrc:      SpO2: 93% 97% 94% 96%  Weight:      Height:         General exam: Moderately built and nourished patient, lying comfortably supine on the gurney in no obvious distress.  Head, eyes and ENT: Nontraumatic and normocephalic. Pupils equally reacting to light and accommodation. Oral mucosa dry.  Neck: Supple. No JVD, carotid bruit or thyromegaly.  Lymphatics: No lymphadenopathy.  Respiratory system: Clear to auscultation. No increased work of breathing.  Cardiovascular system: S1 and S2 heard, RRR. No JVD, murmurs, gallops, clicks or pedal edema.  Gastrointestinal system: Abdomen is nondistended, soft and  Mild tenderness LLQ and mild suprapubic tenderness and mildly distended bladder palpated. Normal bowel sounds heard. No organomegaly or masses appreciated.  Central nervous system: Alert and oriented. No focal neurological deficits.  Extremities: Symmetric 5 x 5 power. Peripheral pulses symmetrically felt.   Skin: No rashes or acute  findings.  Musculoskeletal system: Negative exam.  Psychiatry: Pleasant and cooperative.  Labs  on Admission:  Basic Metabolic Panel: Recent Labs  Lab 05/27/17 0832  NA 138  K 3.7  CL 105  CO2 25  GLUCOSE 115*  BUN 10  CREATININE 0.95  CALCIUM 9.4   Liver Function Tests: Recent Labs  Lab 05/27/17 0832  AST 15  ALT 11*  ALKPHOS 62  BILITOT 0.9  PROT 6.3*  ALBUMIN 3.3*   No results for input(s): LIPASE, AMYLASE in the last 168 hours. No results for input(s): AMMONIA in the last 168 hours. CBC: Recent Labs  Lab 05/27/17 0832  WBC 12.1*  NEUTROABS 10.3*  HGB 14.6  HCT 43.7  MCV 79.2  PLT 115*   Cardiac Enzymes: No results for input(s): CKTOTAL, CKMB, CKMBINDEX, TROPONINI in the last 168 hours.  BNP (last 3 results) No results for input(s): PROBNP in the last 8760 hours. CBG: No results for input(s): GLUCAP in the last 168 hours.  Radiological Exams on Admission: Ct Abdomen Pelvis W Contrast  Result Date: 05/27/2017 CLINICAL DATA:  Patient with this urea.  Nausea. EXAM: CT ABDOMEN AND PELVIS WITH CONTRAST TECHNIQUE: Multidetector CT imaging of the abdomen and pelvis was performed using the standard protocol following bolus administration of intravenous contrast. CONTRAST:  160mL ISOVUE-300 IOPAMIDOL (ISOVUE-300) INJECTION 61% COMPARISON:  CT abdomen pelvis 02/27/2017. FINDINGS: Lower chest: Normal heart size. Dependent atelectasis within the bilateral lower lobes. No pleural effusion. Hepatobiliary: The liver is normal in size and contour. Stable subcentimeter low-attenuation lesion hepatic dome (image 14; series 2) with suggestion of peripheral nodular enhancement. There is an additional lesion within the right hepatic lobe (image 19; series 2) which may represent hemangioma. Gallbladder surgically absent. No intrahepatic or extrahepatic biliary ductal dilatation. Pancreas: Unremarkable Spleen: Mildly enlarged measuring 15.4 cm. Adrenals/Urinary Tract: Normal adrenal  glands. Kidneys enhance symmetrically with contrast. Unchanged low-attenuation renal lesions bilaterally, the largest of which are most compatible with cysts. There is wall thickening of the urinary bladder. Multiple left parapelvic cysts. Stomach/Bowel: Mild wall thickening of the rectum. There is focal bowel wall /mass like thickening and surrounding fat stranding at the level of the cecum/terminal ileum (image 54; series 2). The appendix is normal. Normal morphology of the stomach. No evidence for small bowel obstruction. No free fluid or free intraperitoneal air. Vascular/Lymphatic: Normal caliber abdominal aorta. Peripheral calcified atherosclerotic plaque. No retroperitoneal lymphadenopathy. Reproductive: Prostate is enlarged and heterogeneous in attenuation. Other: Small fat containing left inguinal hernia. Fat containing periumbilical hernia. Musculoskeletal: Stable to slight interval increase in size of nonspecific soft tissue within the presacral location (image 85; series 2) measuring 2.0 x 2.5 cm, previously 2.5 x 1.8 cm. Lumbar spinal fusion hardware. Lower thoracic and lumbar spine degenerative changes. IMPRESSION: 1. Focal inflamed masslike area involving the cecum at the level of the terminal ileum. Findings may represent focal cecal diverticulitis, an infectious or inflammatory process or potentially cecal mass/neoplastic process. Recommend clinical and laboratory correlation. Additionally, patient will need further evaluation with colonoscopy after resolution of the acute symptomatology. 2. Mild wall thickening of the rectum, potentially sequelae of infectious process. 3. Wall thickening of the urinary bladder raising the possibility of cystitis. 4. Re- demonstrated slowly enlarging soft tissue mass within the presacral location. Recommend further evaluation with pelvic MRI. 5. Mild splenomegaly. Electronically Signed   By: Lovey Newcomer M.D.   On: 05/27/2017 12:30   EKG: pending.    Assessment/Plan Active Problems:   Essential hypertension   GASTROESOPHAGEAL REFLUX DISEASE, CHRONIC   Rectal bleeding   Hypothyroidism   Thrombocytopenia (H[facility])  Hyperlipidemia   OSA (obstructive sleep apnea)   Diastolic dysfunction by echo Dec 2014   Dysphagia, pharyngoesophageal phase   History of colonic polyps   Gastrointestinal hemorrhage with melena   Acute cystitis   Pelvic mass in male   Acute diverticulitis   Heme positive stool   Prostatitis   S/P colonoscopy  1. Acute cystitis- Admit for IV antibiotics and supportive care.  Follow urine and blood cultures.   2. Prostatitis - IV levofloxacin ordered and would treat for at least 28 days.  3. Rectal bleeding - This has been an ongoing problem for patient.  He has seen GI and had multiple EGD and colonoscopies done.  His most recent colonoscopy was done in Jan 2016 and multiple polyps and diverticulosis was seen with repeat colonoscopy recommended for 05/2019.  If he has recurrent bleeding would consult GI.  However, he has a normal hemoglobin today. Holding plavix for now.  Follow Hg and follow clinically.  4. Pelvic mass - Will order MRI pelvis to better define.   5. Acute diverticulitis - IV levofloxacin and flagyl ordered.  Follow clinically.  Full liquid diet.  Advance as tolerated.   6. Diastolic heart dysfunction chronic - stable, compensated, follow clinically. Gentle IVFs. 7. Dehydration Moderate - Gentle IVF hydration ordered.   8. Hypothyroidism - resume home thyroid replacement.   9. Hyperlipidemia - resume home atorvastatin.   10. Coronary artery disease - stable, holding plavix for now.   11. OSA - nightly CPAP ordered.    DVT Prophylaxis: SCDs Code Status: DNR  Family Communication: son at bedside  Disposition Plan:  TBD   Time spent: 64 mins   Irwin Brakeman, MD [facility] Pager [phone]  If 7PM-7AM, please contact night-coverage [email or web address] Password [email or web address] 05/27/2017, 3:08 PM

## 2017-05-27 NOTE — Progress Notes (Signed)
Pharmacy Antibiotic Note  [name] [name]. is a 77 y.o. male admitted on 05/27/2017 with UTI and prostatitis  Pharmacy has been consulted for levaquin dosing.  Plan: Levaquin 750 mg IV q24 hours F/u renal function, cultures and clinical course  Height: 5\' 6"  (167.6 cm) Weight: 264 lb 5.3 oz (119.9 kg) IBW/kg (Calculated) : 63.8  Temp (24hrs), Avg:98 F (36.7 C), Min:97.9 F (36.6 C), Max:98 F (36.7 C)  Recent Labs  Lab 05/27/17 0832  WBC 12.1*  CREATININE 0.95    Estimated Creatinine Clearance: 79.4 mL/min (by C-G formula based on SCr of 0.95 mg/dL).    Allergies  Allergen Reactions  . Aspirin Other (See Comments)    Nausea and upset stomach    . Nexium [Esomeprazole Magnesium] Other (See Comments)    Patient said it messed his stomach up    Antimicrobials this admission: levaquin 1/5 >>  flagyl 1/5 >>    Thank you for allowing pharmacy to be a part of this patient's care.  Excell Seltzer Poteet 05/27/2017 5:08 PM

## 2017-05-27 NOTE — ED Provider Notes (Signed)
[facility] EMERGENCY DEPARTMENT Provider Note   CSN: [id number] Arrival date & time: 05/27/17  9833     History   Chief Complaint Chief Complaint  Patient presents with  . Dysuria    HPI [name] [name]. is a 77 y.o. male.  Patient is a 77 year old male who presents to the emergency department with a complaint of dysuria.  The patient states that over the past 2-3 days he has been noticing that his urine has been dark, foul-smelling, and he has been noticing increasing urine frequency.  The patient denies any recent fever.  He has not been nauseated or vomiting.  He has had some back pain, but he states he is unsure if this is related to his urine or some chronic pain.  The patient also states that he has been having problems with nausea and has noted some black tarry looking stools.  This problem started this morning.  The patient states he has some diffuse pain in his abdomen states this is not an unbearable pain. Patient states he has had endoscopy studies at [facility].  He states that polyps were found and they were unable to cut them, so they tied them off and allow them to come off on their own.  The patient also states that many years ago he had a procedure on his prostate that he thinks may have been a transurethral resection.  He states that he had problems with it he had to have recurrent procedures involving his prostate and even now he has problems with his prostate from time to time.   The history is provided by the patient.    Past Medical History:  Diagnosis Date  . Assistance needed for mobility    cane, walker  . Bilateral lower leg cellulitis   . BPH (benign prostatic hyperplasia)   . Cataract    left  . Chronic diastolic HF (heart failure) ([place])   . Chronic low back pain   . Collagen vascular disease ([place])    history of venous insufficency and LE cellulitis  . Complication of anesthesia    2012 due to sleep apnea pt has had  difficulty being put under  and waking up from anesthesia  . Deep venous insufficiency   . GERD (gastroesophageal reflux disease)   . Glaucoma    left  . Headache   . Heel spur    right heel  . History of bladder infections   . History of colonic polyps   . Hyperlipidemia   . Hypertension    Sees Dr. Debara Pickett  . Hypothyroidism    hypothyroid denies no rx  . Iron deficiency 07/07/2016  . Kidney stones    passed them  . Obesity   . Osteoarthritis   . PONV (postoperative nausea and vomiting)   . PUD (peptic ulcer disease)   . Ringing in ears   . S/P colonoscopy 2007, Feb and March 2012   hx of adenomas, left-sided diverticula, On 07/2010 TCS, fresh blood and clot noted coming from TI.  . S/P endoscopy 2007, 2012   2007: nl, May 2012: antral erosions  . SIRS (systemic inflammatory response syndrome) ([place]) 02/27/2012  . Sleep apnea    supposed to see Dr. Luan Pulling to get it started back, hasn't been on in since 2018  . Thrombocytopenia ([place]) 12/30/2011   Stable    Patient Active Problem List   Diagnosis Date Noted  . Bilateral primary osteoarthritis of knee 04/06/2017  .  Weakness 02/27/2017  . Ventral hernia 02/27/2017  . Acute CVA (cerebrovascular accident) ([place]) 09/13/2016  . Stroke (cerebrum) ([place]) 09/13/2016  . Syncope 09/12/2016  . Iron deficiency 07/07/2016  . Back pain 03/22/2016  . Acute right-sided low back pain with right-sided sciatica   . S/P nasal septoplasty 10/07/2015  . Parotitis, acute 03/06/2015  . Nausea without vomiting 12/08/2014  . Dysphagia, pharyngoesophageal phase   . History of colonic polyps   . Hypokalemia 09/01/2013  . Dehydration 09/01/2013  . Leukocytosis 09/01/2013  . Acute on chronic diastolic CHF 35/57/3220  . Diastolic dysfunction by echo Dec 2014 07/30/2013  . Morbid obesity- BMI 44.5 07/30/2013  . Edema of both legs 07/30/2013  . Bilateral lower leg cellulitis 07/18/2013  . 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 OSA (obstructive  sleep apnea) 11/27/2012  . Other spondylosis with radiculopathy, lumbar region 08/22/2012  . Bright red blood per rectum 06/15/2012  . Constipation 02/29/2012  . SIRS (systemic inflammatory response syndrome) ([place]) 02/27/2012  . BPH (benign prostatic hyperplasia) 02/27/2012  . Hyperlipidemia 02/27/2012  . Thrombocytopenia ([place]) 12/30/2011  . Hypothyroidism 10/27/2011  . Upper abdominal pain 10/27/2011  . Rectal bleeding 11/16/2010  . DIARRHEA 06/08/2010  . COLONIC POLYPS, ADENOMATOUS, HX OF 07/16/2009  . Essential hypertension 06/19/2009  . GASTROESOPHAGEAL REFLUX DISEASE, CHRONIC 06/19/2009  . FATTY LIVER DISEASE 06/19/2009  . CHOLELITHIASIS, SYMPTOMATIC 06/19/2009  . ABDOMINAL BLOATING 06/19/2009    Past Surgical History:  Procedure Laterality Date  . BACK SURGERY    . CARDIAC CATHETERIZATION  02/27/2007   no sign of CAD, LVH, mod pulm HTN (Dr. Jackie Plum(  . CATARACT EXTRACTION W/ INTRAOCULAR LENS IMPLANT Right ~ 2012  . COLONOSCOPY N/A 05/28/2014   URK:YHCWCBJS colonic polyps/colonic diverticulosis. Tubular adenomas. Next colonoscopy January 2021  . ESOPHAGOGASTRODUODENOSCOPY  Aug 2013   [facility], single 81mm pedunculated polyp otherwise normal. HYPERPLASTIC, NEGATIVE h.pylori  . ESOPHAGOGASTRODUODENOSCOPY N/A 05/28/2014   RMR:non-critical Schatzki's ring/HH. Benign gastritis  . INGUINAL HERNIA REPAIR Right 1941  . KNEE ARTHROSCOPY Left ~ 2000  . LAPAROSCOPIC CHOLECYSTECTOMY    . [place] SURGERY  2012  . MALONEY DILATION N/A 05/28/2014   Procedure: Venia Minks DILATION;  Surgeon: Daneil Dolin, MD;  Location: [facility];  Service: Endoscopy;  Laterality: N/A;  . NASAL SEPTOPLASTY W/ TURBINOPLASTY Bilateral 10/07/2015   Procedure: NASAL SEPTOPLASTY WITH TURBINATE REDUCTION;  Surgeon: Leta Baptist, MD;  Location: [facility];  Service: ENT;  Laterality: Bilateral;  . NASAL SEPTUM SURGERY Bilateral 10/07/2015    inferior turbinate resection  . NM MYOCAR PERF WALL MOTION  12/2006   dipyridamole  myoview - stress images show mild perfusion defect in mid inferior walls with reversibility at rest; mild perfusion defect in mid inferolateral wall at stress with mild defect reversibility, EF 62%, abnormal but low risk study   . POSTERIOR LUMBAR FUSION  2015  . SAVORY DILATION N/A 05/28/2014   Procedure: SAVORY DILATION;  Surgeon: Daneil Dolin, MD;  Location: [facility];  Service: Endoscopy;  Laterality: N/A;  . SHOULDER OPEN ROTATOR CUFF REPAIR Left early 2000s  . small bowel capsule study  07/2011   ?transient focal ischemia in distal small bowel to explain GI bleeding  . TRANSTHORACIC ECHOCARDIOGRAM  09/2008   EF 60-65%, mod conc LVH  . TRANSURETHRAL RESECTION OF PROSTATE    . TRANSURETHRAL RESECTION OF PROSTATE N/A 06/18/2013   Procedure: TRANSURETHRAL RESECTION OF THE PROSTATE (TURP);  Surgeon: Marissa Nestle, MD;  Location: [facility];  Service: Urology;  Laterality: N/A;       Home Medications    Prior to Admission medications   Medication Sig Start Date End Date Taking? Authorizing Provider  atorvastatin (LIPITOR) 40 MG tablet Take 1 tablet (40 mg total) by mouth daily at 6 PM. 11/10/16   Lendon Colonel, NP  bisacodyl (DULCOLAX) 5 MG EC tablet Take 1 tablet (5 mg total) by mouth daily. 03/26/16   Thurnell Lose, MD  calcium citrate (CALCITRATE - DOSED IN MG ELEMENTAL CALCIUM) 950 MG tablet Take 200 mg of elemental calcium by mouth daily. Takes 3 times per week    [provider]  Cholecalciferol (VITAMIN D3) 1000 units CAPS Take 1 capsule by mouth. Takes 3 times per week    [provider]  clopidogrel (PLAVIX) 75 MG tablet Take 1 tablet (75 mg total) by mouth daily. 11/10/16   Lendon Colonel, NP  diclofenac sodium (VOLTAREN) 1 % GEL Apply 4 g 4 (four) times daily as needed topically. 04/06/17   McKeag, Marylynn Pearson, MD  dorzolamide-timolol (COSOPT) 22.3-6.8 MG/ML ophthalmic solution Place 1 drop into both eyes 2 (two) times daily. 12/17/14   [provider]  ferrous sulfate 325 (65 FE) MG tablet Take 325 mg by mouth daily with breakfast.    [provider]  latanoprost (XALATAN) 0.005 % ophthalmic solution Place 1 drop into both eyes at bedtime.    [provider]  magnesium oxide (MAG-OX) 400 MG tablet Take 400 mg by mouth daily. Takes 2 times per week    [provider]  Menthol, Topical Analgesic, (BIOFREEZE) 4 % GEL To be used by therapy dept as needed    [provider]  methocarbamol (ROBAXIN) 500 MG tablet Take 1 tablet (500 mg total) by mouth 2 (two) times daily. 10/24/16   Francine Graven, DO  Oxycodone HCl 10 MG TABS Take 1 tablet (10 mg total) by mouth 2 (two) times daily as needed (pain). 09/16/16   Reed, Tiffany L, DO  Polyethyl Glycol-Propyl Glycol (LUBRICANT EYE DROPS) 0.4-0.3 % SOLN Place 1 drop into both eyes daily as needed (for lubricant eye drops).     [provider]  potassium chloride (K-DUR,KLOR-CON) 10 MEQ tablet Take 40 mEq by mouth 2 (two) times daily.     [provider]  traMADol (ULTRAM) 50 MG tablet Take 1 tablet (50 mg total) every 12 (twelve) hours as needed by mouth. 04/06/17   McKeag, Marylynn Pearson, MD    Family History Family History  Problem Relation Age of Onset  . Colon cancer Father 70       deceased  . Prostate cancer Father   . Pancreatic cancer Mother 37       deceased  . Prostate cancer Brother   . Arthritis Son   . Arthritis Son   . Diabetes Mellitus II Son     Social History Social History   Tobacco Use  . Smoking status: Former Smoker    Years: 14.00    Types: Cigars    Start date: 11/20/1959    Last attempt to quit: 08/10/1973    Years since quitting: 43.8  . Smokeless tobacco: Never Used  Substance Use Topics  . Alcohol use: No    Alcohol/week: 0.0 oz    Comment: denies  . Drug use: No     Allergies   Aspirin and Nexium [esomeprazole magnesium]   Review of Systems Review of Systems  Constitutional: Negative for  activity change  and appetite change.       All ROS Neg except as noted in HPI  HENT: Negative for nosebleeds, sore throat, trouble swallowing and voice change.   Eyes: Negative for photophobia and discharge.  Respiratory: Negative for cough, shortness of breath and wheezing.   Cardiovascular: Negative for chest pain and palpitations.  Gastrointestinal: Positive for abdominal pain. Negative for blood in stool.       Black stool  Genitourinary: Positive for dysuria, frequency and urgency. Negative for discharge, hematuria, scrotal swelling and testicular pain.  Musculoskeletal: Positive for arthralgias and gait problem. Negative for back pain and neck pain.  Skin: Negative.   Neurological: Negative for dizziness, seizures and speech difficulty.  Psychiatric/Behavioral: Negative for confusion and hallucinations.     Physical Exam Updated Vital Signs BP (!) 154/81 (BP Location: Left Arm)   Pulse 94   Temp 98 F (36.7 C) (Oral)   Resp 17   Ht 5\' 6"  (1.676 m)   Wt 120.2 kg (265 lb)   SpO2 96%   BMI 42.77 kg/m   Physical Exam  Constitutional: He is oriented to person, place, and time. He appears well-developed and well-nourished.  Non-toxic appearance.  HENT:  Head: Normocephalic.  Right Ear: Tympanic membrane and external ear normal.  Left Ear: Tympanic membrane and external ear normal.  Eyes: EOM and lids are normal. Pupils are equal, round, and reactive to light.  Neck: Normal range of motion. Neck supple. Carotid bruit is not present.  Cardiovascular: Normal rate, regular rhythm, normal heart sounds, intact distal pulses and normal pulses.  Pulmonary/Chest: Breath sounds normal. No stridor. No respiratory distress. He has no wheezes. He has no rales.  There is symmetrical rise and fall of the chest.  The patient speaks in complete sentences without problem.  Abdominal: Soft. Bowel sounds are normal. He exhibits no distension and no mass. There is tenderness in the epigastric  area. There is no guarding.  Mild diffuse soreness noted.  Genitourinary: Rectal exam shows guaiac positive stool. Rectal exam shows no mass. Prostate is enlarged and tender.  Genitourinary Comments: No external mass of the anal area.  Patient has good sphincter strength.  There is black stool noted in the rectal vault.  Prostate is significantly enlarged.  Significantly tender at times.  No bright red blood on the gloved finger.  No internal mass appreciated.  Musculoskeletal: Normal range of motion.  Lymphadenopathy:       Head (right side): No submandibular adenopathy present.       Head (left side): No submandibular adenopathy present.    He has no cervical adenopathy.  Neurological: He is alert and oriented to person, place, and time. He has normal strength. No cranial nerve deficit or sensory deficit.  Skin: Skin is warm and dry.  Psychiatric: He has a normal mood and affect. His speech is normal.  Nursing note and vitals reviewed.    ED Treatments / Results  Labs (all labs ordered are listed, but only abnormal results are displayed) Labs Reviewed  CBC WITH DIFFERENTIAL/PLATELET  COMPREHENSIVE METABOLIC PANEL  URINALYSIS, ROUTINE W REFLEX MICROSCOPIC    EKG  EKG Interpretation None       Radiology No results found.  Procedures Procedures (including critical care time)  Medications Ordered in ED Medications - No data to display   Initial Impression / Assessment and Plan / ED Course  I have reviewed the triage vital signs and the nursing notes.  Pertinent labs & imaging results that were  available during my care of the patient were reviewed by me and considered in my medical decision making (see chart for details).       Final Clinical Impressions(s) / ED Diagnoses MDM Blood pressure slightly elevated, otherwise vital signs within normal limits.  Pulse oximetry is 96% on room air on admission.  The rectal exam shows the prostate to be enlarged and tender.   There is black stool in the rectal vault that is occult blood positive.  Patient seen with me by Dr. Lacinda Axon  Complete blood count shows the white blood cells to be elevated at 12,100.  Platelets are low at 115,000, however the patient's previous tests show low platelets in the same range.  There is no significant shift to the left noted.  The comprehensive metabolic panel shows a glucose to be slightly elevated at 115, the alkaline phosphatase is normal at 62, the total bilirubin is normal at 0.9.  The anion gap is normal at 8. Urinalysis shows a large leukocyte esterase with too many to count red blood cells and too many to count white blood cells.  There is moderate hemoglobin and 30 mg/dL of protein noted on the dipstick.  Given these findings, will obtain a CT scan of the abdomen and pelvis.  Patient states he is having more pain after return from CT scan.  Patient treated with IV medications.  Patient remains awake and alert in no distress at this time.  CT scan shows focal inflamed masslike area involving the cecum.  There is question if this is a diverticulitis, an infectious or inflammatory process, or possibly a cecal mass/neoplastic process.  In addition the patient has mild wall thickening of the rectum possibly consistent with an infectious process.  There is wall thickening of the urinary bladder consistent with cystitis.  There is also noted a redemonstrated slowly enlarging soft tissue mass within the presacral location.  Test results discussed with Dr. Lacinda Axon.  The findings have been discussed with the patient in terms which he understands.  The patient is in agreement for an admission.  Case discussed with Dr. Shiela Mayer.  Patient to be admitted to the hospital.   Final diagnoses:  Gastrointestinal hemorrhage associated with intestinal diverticulitis  Acute cystitis without hematuria  Prostatitis, unspecified prostatitis type    ED Discharge Orders    None       Lily Kocher, PA-C 05/27/17 1841    Nat Christen, MD 05/28/17 [phone]

## 2017-05-28 DIAGNOSIS — R972 Elevated prostate specific antigen [PSA]: Secondary | ICD-10-CM

## 2017-05-28 LAB — CBC WITH DIFFERENTIAL/PLATELET
BASOS ABS: 0 10*3/uL (ref 0.0–0.1)
Basophils Relative: 0 %
Eosinophils Absolute: 0.1 10*3/uL (ref 0.0–0.7)
Eosinophils Relative: 1 %
HCT: 42.9 % (ref 39.0–52.0)
HEMOGLOBIN: 13.9 g/dL (ref 13.0–17.0)
LYMPHS ABS: 1.5 10*3/uL (ref 0.7–4.0)
LYMPHS PCT: 12 %
MCH: 26.2 pg (ref 26.0–34.0)
MCHC: 32.4 g/dL (ref 30.0–36.0)
MCV: 80.9 fL (ref 78.0–100.0)
Monocytes Absolute: 0.5 10*3/uL (ref 0.1–1.0)
Monocytes Relative: 4 %
Neutro Abs: 10.4 10*3/uL — ABNORMAL HIGH (ref 1.7–7.7)
Neutrophils Relative %: 83 %
PLATELETS: 110 10*3/uL — AB (ref 150–400)
RBC: 5.3 MIL/uL (ref 4.22–5.81)
RDW: 16.6 % — ABNORMAL HIGH (ref 11.5–15.5)
WBC: 12.6 10*3/uL — ABNORMAL HIGH (ref 4.0–10.5)

## 2017-05-28 LAB — COMPREHENSIVE METABOLIC PANEL
ALBUMIN: 2.9 g/dL — AB (ref 3.5–5.0)
ALK PHOS: 57 U/L (ref 38–126)
ALT: 10 U/L — AB (ref 17–63)
AST: 12 U/L — AB (ref 15–41)
Anion gap: 8 (ref 5–15)
BILIRUBIN TOTAL: 0.7 mg/dL (ref 0.3–1.2)
BUN: 10 mg/dL (ref 6–20)
CALCIUM: 9.1 mg/dL (ref 8.9–10.3)
CO2: 26 mmol/L (ref 22–32)
Chloride: 105 mmol/L (ref 101–111)
Creatinine, Ser: 0.96 mg/dL (ref 0.61–1.24)
GFR calc Af Amer: >60 mL/min (ref >60)
GFR calc non Af Amer: >60 mL/min (ref >60)
GLUCOSE: 101 mg/dL — AB (ref 65–99)
Potassium: 4 mmol/L (ref 3.5–5.1)
Sodium: 139 mmol/L (ref 135–145)
TOTAL PROTEIN: 6.1 g/dL — AB (ref 6.5–8.1)

## 2017-05-28 LAB — MAGNESIUM: Magnesium: 1.9 mg/dL (ref 1.7–2.4)

## 2017-05-28 MED ORDER — GI COCKTAIL ~~LOC~~
30.0000 mL | Freq: Once | ORAL | Status: AC
  Administered 2017-05-28: 30 mL via ORAL
  Filled 2017-05-28: qty 30

## 2017-05-28 MED ORDER — BISACODYL 5 MG PO TBEC: 5.0000 mg | DELAYED_RELEASE_TABLET | Freq: Every day | ORAL | Status: DC | PRN

## 2017-05-28 NOTE — Progress Notes (Signed)
PROGRESS NOTE    [name] [name].  ION:[id number]  DOB: 1940/10/22  DOA: 05/27/2017 PCP: Redmond School, MD   Brief Admission Hx: [name] [name]. is a 77 y.o. male with BPH and history of rectal bleeding, GERD, OSA, BPH, chronic diastolic heart failure, glaucoma, multiple colonic polyps and colonic diverticulosis, HTN and hypothyroidism presents to ED because he has noticed foul smelling urine for the past 2 days.   He developed nausea this morning, no emesis and had a black tarry stool.  He reportedly had a colonoscopy in 2016 by Dr. Sydell Axon and noted to have multiple colon polyps and diverticulosis and is due for repeat colonoscopy in Jan 2021.  The patient reports that he became concerned because of a foul smelling urine.  He also reports increasing urinary frequency and uncomfortable nocturia.  He reports that he has been urinating at least 5 times per night.   MDM/Assessment & Plan:   1. Acute cystitis- Admitted for IV antibiotics and supportive care.  Follow urine and blood cultures.   2. Prostatitis - IV levofloxacin ordered and would treat for at least 28 days.  3. Elevated PSA - could be elevated from prostatitis, MRI pelvis pending, consider urology consult pending MRI findings.  He will need urology follow up.   4. Rectal bleeding - This has been an ongoing problem for patient.  He has seen GI and had multiple EGD and colonoscopies done.  His most recent colonoscopy was done in Jan 2016 and multiple polyps and diverticulosis was seen with repeat colonoscopy recommended for 05/2019.  If he has recurrent bleeding would consult GI.  Holding plavix for now.  Follow Hg and follow clinically.  5. Pelvic mass - Will order MRI pelvis to better define.  It is scheduled to be done 1/7.   6. Acute diverticulitis - IV levofloxacin and flagyl ordered.  Follow clinically.  Full liquid diet.  Advance as tolerated.   7. Diastolic heart dysfunction chronic - stable, compensated, follow  clinically. Gentle IVFs. 8. Dehydration Moderate - Gentle IVF hydration ordered.   9. Hypothyroidism - resume home thyroid replacement.   10. Hyperlipidemia - resume home atorvastatin.   11. Coronary artery disease - stable, holding plavix for now.   12. OSA - nightly CPAP ordered.    DVT Prophylaxis: SCDs Code Status: DNR  Family Communication: son at bedside  Disposition Plan:  TBD   Subjective: Pt reports abdominal pain, gas, loose stools and weakness this morning.  He is reporting a headache.    Objective: Vitals:   05/27/17 1530 05/27/17 1620 05/27/17 2051 05/28/17 0538  BP: (!) 143/86 (!) 156/80 125/70 (!) 107/43  Pulse: 78 81 80 73  Resp:  20 18 18   Temp:  97.9 F (36.6 C) (!) 97.4 F (36.3 C)   TempSrc:  Oral Oral   SpO2: 92% 98% 98% 95%  Weight:  119.9 kg (264 lb 5.3 oz)    Height:  5\' 6"  (1.676 m)      Intake/Output Summary (Last 24 hours) at 05/28/2017 0810 Last data filed at 05/28/2017 0540 Gross per 24 hour  Intake 1950 ml  Output 1550 ml  Net 400 ml   Filed Weights   05/27/17 0816 05/27/17 1620  Weight: 120.2 kg (265 lb) 119.9 kg (264 lb 5.3 oz)     REVIEW OF SYSTEMS  As per history otherwise all reviewed and reported negative  Exam:  General exam: elderly male, awake, alert, NAD. Ambulating in room.  Cooperative.  Respiratory system: BBS CTA. No increased work of breathing. Cardiovascular system: S1 & S2 heard. No JVD, murmurs, gallops, clicks or pedal edema. Gastrointestinal system: Abdomen is nondistended, soft and generalized tenderness. Normal active bowel sounds heard. Central nervous system: Alert and oriented. No focal neurological deficits. Extremities: no CCE.  Data Reviewed: Basic Metabolic Panel: Recent Labs  Lab 05/27/17 0832  NA 138  K 3.7  CL 105  CO2 25  GLUCOSE 115*  BUN 10  CREATININE 0.95  CALCIUM 9.4   Liver Function Tests: Recent Labs  Lab 05/27/17 0832  AST 15  ALT 11*  ALKPHOS 62  BILITOT 0.9  PROT 6.3*    ALBUMIN 3.3*   No results for input(s): LIPASE, AMYLASE in the last 168 hours. No results for input(s): AMMONIA in the last 168 hours. CBC: Recent Labs  Lab 05/27/17 0832  WBC 12.1*  NEUTROABS 10.3*  HGB 14.6  HCT 43.7  MCV 79.2  PLT 115*   Cardiac Enzymes: No results for input(s): CKTOTAL, CKMB, CKMBINDEX, TROPONINI in the last 168 hours. CBG (last 3)  No results for input(s): GLUCAP in the last 72 hours. No results found for this or any previous visit (from the past 240 hour(s)).   Studies: Ct Abdomen Pelvis W Contrast  Result Date: 05/27/2017 CLINICAL DATA:  Patient with this urea.  Nausea. EXAM: CT ABDOMEN AND PELVIS WITH CONTRAST TECHNIQUE: Multidetector CT imaging of the abdomen and pelvis was performed using the standard protocol following bolus administration of intravenous contrast. CONTRAST:  176mL ISOVUE-300 IOPAMIDOL (ISOVUE-300) INJECTION 61% COMPARISON:  CT abdomen pelvis 02/27/2017. FINDINGS: Lower chest: Normal heart size. Dependent atelectasis within the bilateral lower lobes. No pleural effusion. Hepatobiliary: The liver is normal in size and contour. Stable subcentimeter low-attenuation lesion hepatic dome (image 14; series 2) with suggestion of peripheral nodular enhancement. There is an additional lesion within the right hepatic lobe (image 19; series 2) which may represent hemangioma. Gallbladder surgically absent. No intrahepatic or extrahepatic biliary ductal dilatation. Pancreas: Unremarkable Spleen: Mildly enlarged measuring 15.4 cm. Adrenals/Urinary Tract: Normal adrenal glands. Kidneys enhance symmetrically with contrast. Unchanged low-attenuation renal lesions bilaterally, the largest of which are most compatible with cysts. There is wall thickening of the urinary bladder. Multiple left parapelvic cysts. Stomach/Bowel: Mild wall thickening of the rectum. There is focal bowel wall /mass like thickening and surrounding fat stranding at the level of the  cecum/terminal ileum (image 54; series 2). The appendix is normal. Normal morphology of the stomach. No evidence for small bowel obstruction. No free fluid or free intraperitoneal air. Vascular/Lymphatic: Normal caliber abdominal aorta. Peripheral calcified atherosclerotic plaque. No retroperitoneal lymphadenopathy. Reproductive: Prostate is enlarged and heterogeneous in attenuation. Other: Small fat containing left inguinal hernia. Fat containing periumbilical hernia. Musculoskeletal: Stable to slight interval increase in size of nonspecific soft tissue within the presacral location (image 85; series 2) measuring 2.0 x 2.5 cm, previously 2.5 x 1.8 cm. Lumbar spinal fusion hardware. Lower thoracic and lumbar spine degenerative changes. IMPRESSION: 1. Focal inflamed masslike area involving the cecum at the level of the terminal ileum. Findings may represent focal cecal diverticulitis, an infectious or inflammatory process or potentially cecal mass/neoplastic process. Recommend clinical and laboratory correlation. Additionally, patient will need further evaluation with colonoscopy after resolution of the acute symptomatology. 2. Mild wall thickening of the rectum, potentially sequelae of infectious process. 3. Wall thickening of the urinary bladder raising the possibility of cystitis. 4. Re- demonstrated slowly enlarging soft tissue mass within the presacral location. Recommend further  evaluation with pelvic MRI. 5. Mild splenomegaly. Electronically Signed   By: Lovey Newcomer M.D.   On: 05/27/2017 12:30   Scheduled Meds: . atorvastatin  40 mg Oral q1800  . bisacodyl  5 mg Oral Daily  . brimonidine  1 drop Both Eyes BID  . [START ON 05/29/2017] calcium carbonate  250 mg of elemental calcium Oral Once per day on Mon Wed Fri  . dorzolamide-timolol  1 drop Both Eyes BID  . ferrous sulfate  325 mg Oral Q breakfast  . gi cocktail  30 mL Oral Once  . latanoprost  1 drop Both Eyes QHS  . [START ON 05/29/2017] magnesium  oxide  400 mg Oral Once per day on Mon Thu  . methocarbamol  500 mg Oral BID  . potassium chloride  40 mEq Oral BID  . tamsulosin  0.4 mg Oral QPC supper   Continuous Infusions: . sodium chloride 60 mL/hr at 05/27/17 1720  . levofloxacin (LEVAQUIN) IV Stopped (05/27/17 1855)  . metronidazole Stopped (05/28/17 0140)    Active Problems:   Essential hypertension   GASTROESOPHAGEAL REFLUX DISEASE, CHRONIC   Rectal bleeding   Hypothyroidism   Thrombocytopenia (HCC)   Hyperlipidemia   OSA (obstructive sleep apnea)   Diastolic dysfunction by echo Dec 2014   Dysphagia, pharyngoesophageal phase   History of colonic polyps   Gastrointestinal hemorrhage with melena   Acute cystitis   Pelvic mass in male   Acute diverticulitis   Heme positive stool   Prostatitis   S/P colonoscopy  Time spent:   Irwin Brakeman, MD, FAAFP [facility] Pager [phone]  If 7PM-7AM, please contact night-coverage [email or web address] Password [email or web address] 05/28/2017, 8:10 AM    LOS: 1 day

## 2017-05-29 ENCOUNTER — Inpatient Hospital Stay (HOSPITAL_COMMUNITY): Payer: Medicare HMO

## 2017-05-29 LAB — COMPREHENSIVE METABOLIC PANEL
ALT: 9 U/L — ABNORMAL LOW (ref 17–63)
ANION GAP: 7 (ref 5–15)
AST: 14 U/L — ABNORMAL LOW (ref 15–41)
Albumin: 2.7 g/dL — ABNORMAL LOW (ref 3.5–5.0)
Alkaline Phosphatase: 51 U/L (ref 38–126)
BUN: 9 mg/dL (ref 6–20)
CALCIUM: 8.9 mg/dL (ref 8.9–10.3)
CO2: 27 mmol/L (ref 22–32)
Chloride: 103 mmol/L (ref 101–111)
Creatinine, Ser: 0.93 mg/dL (ref 0.61–1.24)
GFR calc Af Amer: >60 mL/min (ref >60)
Glucose, Bld: 119 mg/dL — ABNORMAL HIGH (ref 65–99)
POTASSIUM: 4 mmol/L (ref 3.5–5.1)
Sodium: 137 mmol/L (ref 135–145)
Total Bilirubin: 0.7 mg/dL (ref 0.3–1.2)
Total Protein: 5.6 g/dL — ABNORMAL LOW (ref 6.5–8.1)

## 2017-05-29 LAB — CBC WITH DIFFERENTIAL/PLATELET
BASOS ABS: 0.1 10*3/uL (ref 0.0–0.1)
BASOS PCT: 0 %
EOS ABS: 0.1 10*3/uL (ref 0.0–0.7)
Eosinophils Relative: 1 %
HEMATOCRIT: 41.2 % (ref 39.0–52.0)
Hemoglobin: 13.1 g/dL (ref 13.0–17.0)
Lymphocytes Relative: 16 %
Lymphs Abs: 1.8 10*3/uL (ref 0.7–4.0)
MCH: 25.8 pg — ABNORMAL LOW (ref 26.0–34.0)
MCHC: 31.8 g/dL (ref 30.0–36.0)
MCV: 81.3 fL (ref 78.0–100.0)
MONO ABS: 0.5 10*3/uL (ref 0.1–1.0)
Monocytes Relative: 4 %
NEUTROS ABS: 9.1 10*3/uL — AB (ref 1.7–7.7)
NEUTROS PCT: 79 %
Platelets: 109 10*3/uL — ABNORMAL LOW (ref 150–400)
RBC: 5.07 MIL/uL (ref 4.22–5.81)
RDW: 16.5 % — AB (ref 11.5–15.5)
WBC: 11.5 10*3/uL — ABNORMAL HIGH (ref 4.0–10.5)

## 2017-05-29 LAB — MAGNESIUM: MAGNESIUM: 1.9 mg/dL (ref 1.7–2.4)

## 2017-05-29 MED ORDER — SIMETHICONE 80 MG PO CHEW
80.0000 mg | CHEWABLE_TABLET | Freq: Four times a day (QID) | ORAL | Status: AC
  Administered 2017-05-29 – 2017-05-30 (×4): 80 mg via ORAL
  Filled 2017-05-29 (×4): qty 1

## 2017-05-29 MED ORDER — GI COCKTAIL ~~LOC~~
30.0000 mL | Freq: Once | ORAL | Status: AC
  Administered 2017-05-29: 30 mL via ORAL
  Filled 2017-05-29: qty 30

## 2017-05-29 NOTE — Plan of Care (Signed)
  Acute Rehab PT Goals(only PT should resolve) Pt Will Go Supine/Side To Sit 05/29/2017 1521 - Progressing by Lonell Grandchild, PT Flowsheets Taken 05/29/2017 1521  Pt will go Supine/Side to Sit Independently Patient Will Transfer Sit To/From Stand 05/29/2017 1521 - Progressing by Lonell Grandchild, PT Flowsheets Taken 05/29/2017 1521  Patient will transfer sit to/from stand with supervision Pt Will Transfer Bed To Chair/Chair To Bed 05/29/2017 1521 - Progressing by Lonell Grandchild, PT Flowsheets Taken 05/29/2017 1521  Pt will Transfer Bed to Chair/Chair to Bed with supervision Pt Will Ambulate 05/29/2017 1521 - Progressing by Lonell Grandchild, PT Flowsheets Taken 05/29/2017 1521  Pt will Ambulate 100 feet;with supervision;with rolling walker  3:22 PM, 05/29/17 Lonell Grandchild, MPT Physical Therapist with [facility] [phone] office [phone] mobile phone

## 2017-05-29 NOTE — Progress Notes (Signed)
PROGRESS NOTE    [name] [name].  TIW:[id number]  DOB: [birth year]  DOA: 05/27/2017 PCP: Redmond [employer], MD   Brief Admission Hx: [name] [name]. is a 77 y.o. male with BPH and history of rectal bleeding, GERD, OSA, BPH, chronic diastolic heart failure, glaucoma, multiple colonic polyps and colonic diverticulosis, HTN and hypothyroidism presents to ED because he has noticed foul smelling urine for the past 2 days.   He developed nausea this morning, no emesis and had a black tarry stool.  He reportedly had a colonoscopy in 2016 by Dr. Sydell Axon and noted to have multiple colon polyps and diverticulosis and is due for repeat colonoscopy in Jan 2021.  The patient reports that he became concerned because of a foul smelling urine.  He also reports increasing urinary frequency and uncomfortable nocturia.  He reports that he has been urinating at least 5 times per night.   MDM/Assessment & Plan:   1. Acute cystitis- Admitted for IV antibiotics and supportive care.  Follow urine and blood cultures.   2. Prostatitis - IV levofloxacin ordered and would treat for at least 28 days.  3. Elevated PSA - could be elevated from prostatitis, MRI pelvis pending, consider urology consult pending MRI findings.  He will need urology follow up.  He will need to be transferred to [facility] to have his MRI procedure and then return which is scheduled for tomorrow. 4. Rectal bleeding - This has been an ongoing problem for patient.  He has seen GI and had multiple EGD and colonoscopies done.  His most recent colonoscopy was done in Jan 2016 and multiple polyps and diverticulosis was seen with repeat colonoscopy recommended for 05/2019.  If he has recurrent bleeding would consult GI.  Holding plavix for now.  Follow Hg and follow clinically.  He denies recurrent melena. 5. Pelvic mass - Will order MRI pelvis to better define.  It is scheduled to be done at [facility] on 1/8.   6. Acute diverticulitis - IV  levofloxacin and flagyl ordered.  Follow clinically.  Advance diet to soft.  7. Diastolic heart dysfunction chronic - stable, compensated, follow clinically.  8. Dehydration Moderate - Gentle IVF hydration ordered.    Now that he is eating and drinking better will cut back on the fluids. 9. Hypothyroidism - resume home thyroid replacement.   10. Hyperlipidemia - resume home atorvastatin.   11. Coronary artery disease - stable, holding plavix for now given GI bleed.    His hemoglobin remains normal.  Plan to restart Plavix 1/8. 12. OSA - nightly CPAP ordered but patient has been refusing.    DVT Prophylaxis: SCDs Code Status: DNR  Family Communication: son at bedside  Disposition Plan:  TBD   Subjective: Pt reports abdominal gas pain.  Objective: Vitals:   05/28/17 0538 05/28/17 1400 05/28/17 2000 05/29/17 0533  BP: (!) 107/43 (!) 115/50 (!) 143/85 126/71  Pulse: 73 73 83 75  Resp: 18 18 16 15   Temp:  98.1 F (36.7 C) 98 F (36.7 C) 97.8 F (36.6 C)  TempSrc:  Oral Oral Oral  SpO2: 95% 93% 98% 92%  Weight:      Height:        Intake/Output Summary (Last 24 hours) at 05/29/2017 1219 Last data filed at 05/29/2017 1004 Gross per 24 hour  Intake 1130 ml  Output 2450 ml  Net -1320 ml   Filed Weights   05/27/17 0816 05/27/17 1620  Weight: 120.2 kg (265 lb)  119.9 kg (264 lb 5.3 oz)     REVIEW OF SYSTEMS  As per history otherwise all reviewed and reported negative  Exam:  General exam: elderly male, awake, alert, NAD. Ambulating in room.  Cooperative.  Respiratory system: BBS CTA. No increased work of breathing. Cardiovascular system: S1 & S2 heard. No JVD, murmurs, gallops, clicks or pedal edema. Gastrointestinal system: Abdomen is nondistended, soft and generalized tenderness. Normal active bowel sounds heard. Central nervous system: Alert and oriented. No focal neurological deficits. Extremities: no CCE.  Data Reviewed: Basic Metabolic Panel: Recent Labs  Lab  05/27/17 0832 05/28/17 0618 05/29/17 0531  NA 138 139 137  K 3.7 4.0 4.0  CL 105 105 103  CO2 25 26 27   GLUCOSE 115* 101* 119*  BUN 10 10 9   CREATININE 0.95 0.96 0.93  CALCIUM 9.4 9.1 8.9  MG  --  1.9 1.9   Liver Function Tests: Recent Labs  Lab 05/27/17 0832 05/28/17 0618 05/29/17 0531  AST 15 12* 14*  ALT 11* 10* 9*  ALKPHOS 62 57 51  BILITOT 0.9 0.7 0.7  PROT 6.3* 6.1* 5.6*  ALBUMIN 3.3* 2.9* 2.7*   No results for input(s): LIPASE, AMYLASE in the last 168 hours. No results for input(s): AMMONIA in the last 168 hours. CBC: Recent Labs  Lab 05/27/17 0832 05/28/17 0618 05/29/17 0531  WBC 12.1* 12.6* 11.5*  NEUTROABS 10.3* 10.4* 9.1*  HGB 14.6 13.9 13.1  HCT 43.7 42.9 41.2  MCV 79.2 80.9 81.3  PLT 115* 110* 109*   Cardiac Enzymes: No results for input(s): CKTOTAL, CKMB, CKMBINDEX, TROPONINI in the last 168 hours. CBG (last 3)  No results for input(s): GLUCAP in the last 72 hours. Recent Results (from the past 240 hour(s))  Urine culture     Status: Abnormal (Preliminary result)   Collection Time: 05/27/17  9:35 AM  Result Value Ref Range Status   Specimen Description URINE, CLEAN CATCH  Final   Special Requests NONE  Final   Culture >=100,000 COLONIES/mL PROTEUS MIRABILIS (A)  Final   Report Status PENDING  Incomplete     Studies: No results found. Scheduled Meds: . atorvastatin  40 mg Oral q1800  . brimonidine  1 drop Both Eyes BID  . calcium carbonate  250 mg of elemental calcium Oral Once per day on Mon Wed Fri  . dorzolamide-timolol  1 drop Both Eyes BID  . ferrous sulfate  325 mg Oral Q breakfast  . latanoprost  1 drop Both Eyes QHS  . magnesium oxide  400 mg Oral Once per day on Mon Thu  . methocarbamol  500 mg Oral BID  . potassium chloride  40 mEq Oral BID  . simethicone  80 mg Oral QID  . tamsulosin  0.4 mg Oral QPC supper   Continuous Infusions: . levofloxacin (LEVAQUIN) IV Stopped (05/28/17 1849)  . metronidazole Stopped (05/29/17  1104)    Active Problems:   Essential hypertension   GASTROESOPHAGEAL REFLUX DISEASE, CHRONIC   Rectal bleeding   Hypothyroidism   Thrombocytopenia (HCC)   Hyperlipidemia   OSA (obstructive sleep apnea)   Diastolic dysfunction by echo Dec 2014   Dysphagia, pharyngoesophageal phase   History of colonic polyps   Gastrointestinal hemorrhage with melena   Acute cystitis   Pelvic mass in male   Acute diverticulitis   Heme positive stool   Prostatitis   S/P colonoscopy  Time spent:   Irwin Brakeman, MD, FAAFP [facility] Pager [phone]  If 7PM-7AM, please  contact night-coverage [email or web address] Password [email or web address] 05/29/2017, 12:19 PM    LOS: 2 days

## 2017-05-29 NOTE — Evaluation (Signed)
Physical Therapy Evaluation Patient Details Name: Zylen Wenig. MRN: [id number] DOB: [birth year] Today's Date: 05/29/2017   History of Present Illness  Sahand Gosch. is a 77 y.o. male with BPH and history of rectal bleeding, GERD, OSA, BPH, chronic diastolic heart failure, glaucoma, multiple colonic polyps and colonic diverticulosis, HTN and hypothyroidism presents to ED because he has noticed foul smelling urine for the past 2 days.   He developed nausea this morning, no emesis and had a black tarry stool.  He reportedly had a colonoscopy in 2016 by Dr. Sydell Axon and noted to have multiple colon polyps and diverticulosis and is due for repeat colonoscopy in Jan 2021.  The patient reports that he became concerned because of a foul smelling urine.  He also reports increasing urinary frequency and uncomfortable nocturia.  He reports that he has been urinating at least 5 times per night.  He has not been able to sleep well in the last 2 days.  The patient reports pain in the groin area.  He reports burning with urination.  He denies any blood in the urine.    Clinical Impression  Patient limited for functional mobility as stated below secondary to BLE weakness, fatigue and fair/poor standing balance.  Patient tolerated sitting up in chair after therapy.  Patient will benefit from continued physical therapy in hospital and recommended venue below to increase strength, balance, endurance for safe ADLs and gait.    Follow Up Recommendations Home health PT    Equipment Recommendations  None recommended by PT    Recommendations for Other Services       Precautions / Restrictions Precautions Precautions: Fall Restrictions Weight Bearing Restrictions: No      Mobility  Bed Mobility Overal bed mobility: Needs Assistance Bed Mobility: Supine to Sit     Supine to sit: Min guard        Transfers Overall transfer level: Needs assistance Equipment used: Rolling walker (2  wheeled) Transfers: Sit to/from [facility] Sit to Stand: Min guard Stand pivot transfers: Min guard          Ambulation/Gait Ambulation/Gait assistance: Min guard Ambulation Distance (Feet): 40 Feet Assistive device: Rolling walker (2 wheeled) Gait Pattern/deviations: Decreased step length - right;Decreased step length - left;Decreased stride length   Gait velocity interpretation: Below normal speed for age/gender General Gait Details: demonstrates slightly labored slow cadence without loss of balance, limited secondary to c/o fatigue  Stairs            Wheelchair Mobility    Modified Rankin (Stroke Patients Only)       Balance Overall balance assessment: Needs assistance Sitting-balance support: No upper extremity supported;Feet supported Sitting balance-Leahy Scale: Good     Standing balance support: Bilateral upper extremity supported;During functional activity Standing balance-Leahy Scale: Fair                               Pertinent Vitals/Pain Pain Assessment: 0-10 Pain Score: 7  Pain Location: stomach and groin area Pain Descriptors / Indicators: Aching Pain Intervention(s): Limited activity within patient's tolerance;Monitored during session    [place] expects to be discharged to:: Private residence Living Arrangements: Alone Available Help at Discharge: Family(his sons) Type of Home: House Home Access: Stairs to enter Entrance Stairs-Rails: Left Entrance Stairs-Number of Steps: 3 Home Layout: One level Home Equipment: Walker - 4 wheels;Walker - 2 wheels;Cane - single point Additional Comments: has cane  in every room    Prior Function Level of Independence: Independent with assistive device(s)         Comments: Ambulates with SPC mostly household distances     Hand Dominance        Extremity/Trunk Assessment   Upper Extremity Assessment Upper Extremity Assessment: Generalized  weakness    Lower Extremity Assessment Lower Extremity Assessment: Generalized weakness    Cervical / Trunk Assessment Cervical / Trunk Assessment: Normal  Communication   Communication: No difficulties  Cognition Arousal/Alertness: Awake/alert Behavior During Therapy: WFL for tasks assessed/performed Overall Cognitive Status: Within Functional Limits for tasks assessed                                        General Comments      Exercises     Assessment/Plan    PT Assessment Patient needs continued PT services  PT Problem List Decreased strength;Decreased activity tolerance;Decreased balance;Decreased mobility       PT Treatment Interventions Gait training;Stair training;Functional mobility training;Therapeutic activities;Patient/family education    PT Goals (Current goals can be found in the Care Plan section)  Acute Rehab PT Goals Patient Stated Goal: return home PT Goal Formulation: With patient Time For Goal Achievement: 2017-06-28 Potential to Achieve Goals: Good    Frequency Min 3X/week   Barriers to discharge        Co-evaluation               AM-PAC PT "6 Clicks" Daily Activity  Outcome Measure Difficulty turning over in bed (including adjusting bedclothes, sheets and blankets)?: None Difficulty moving from lying on back to sitting on the side of the bed? : A Little Difficulty sitting down on and standing up from a chair with arms (e.g., wheelchair, bedside commode, etc,.)?: A Little Help needed moving to and from a bed to chair (including a wheelchair)?: A Little Help needed walking in hospital room?: A Little Help needed climbing 3-5 steps with a railing? : A Little 6 Click Score: 19    End of Session   Activity Tolerance: Patient tolerated treatment well;Patient limited by fatigue Patient left: in chair;with call bell/phone within reach Nurse Communication: Mobility status PT Visit Diagnosis: Unsteadiness on feet  (R26.81);Other abnormalities of gait and mobility (R26.89);Muscle weakness (generalized) (M62.81)    Time: 8676-1950 PT Time Calculation (min) (ACUTE ONLY): 26 min   Charges:   PT Evaluation $PT Eval Moderate Complexity: 1 Mod PT Treatments $Therapeutic Activity: 23-37 mins   PT G Codes:        3:18 PM, 06-28-2017 Lonell Grandchild, MPT Physical Therapist with [facility] [phone] office [phone] mobile phone

## 2017-05-29 NOTE — Progress Notes (Signed)
Patient continues wear nasal cannula at 2lpm because he has refuse CPAP at this time; patient doesn't wear one at home but has one available. But due the fact he has to get up frequently he doesn't wear it at night.

## 2017-05-30 ENCOUNTER — Ambulatory Visit (HOSPITAL_COMMUNITY)
Admit: 2017-05-30 | Discharge: 2017-05-30 | Disposition: A | Discharge status: Home / Self Care | Payer: Medicare HMO | Attending: Family Medicine | Admitting: Family Medicine

## 2017-05-30 ENCOUNTER — Inpatient Hospital Stay (HOSPITAL_COMMUNITY): Payer: Medicare HMO

## 2017-05-30 DIAGNOSIS — R1909 Other intra-abdominal and pelvic swelling, mass and lump: Secondary | ICD-10-CM | POA: Diagnosis not present

## 2017-05-30 LAB — CBC WITH DIFFERENTIAL/PLATELET
BASOS PCT: 0 %
Basophils Absolute: 0 10*3/uL (ref 0.0–0.1)
EOS ABS: 0.1 10*3/uL (ref 0.0–0.7)
Eosinophils Relative: 1 %
HCT: 42.6 % (ref 39.0–52.0)
HEMOGLOBIN: 13.8 g/dL (ref 13.0–17.0)
Lymphocytes Relative: 14 %
Lymphs Abs: 1.8 10*3/uL (ref 0.7–4.0)
MCH: 26.2 pg (ref 26.0–34.0)
MCHC: 32.4 g/dL (ref 30.0–36.0)
MCV: 80.8 fL (ref 78.0–100.0)
Monocytes Absolute: 0.5 10*3/uL (ref 0.1–1.0)
Monocytes Relative: 4 %
NEUTROS PCT: 81 %
Neutro Abs: 10.7 10*3/uL — ABNORMAL HIGH (ref 1.7–7.7)
Platelets: 111 10*3/uL — ABNORMAL LOW (ref 150–400)
RBC: 5.27 MIL/uL (ref 4.22–5.81)
RDW: 16.4 % — ABNORMAL HIGH (ref 11.5–15.5)
WBC: 13.2 10*3/uL — AB (ref 4.0–10.5)

## 2017-05-30 LAB — COMPREHENSIVE METABOLIC PANEL
ALK PHOS: 50 U/L (ref 38–126)
ALT: 9 U/L — ABNORMAL LOW (ref 17–63)
ANION GAP: 8 (ref 5–15)
AST: 11 U/L — ABNORMAL LOW (ref 15–41)
Albumin: 2.9 g/dL — ABNORMAL LOW (ref 3.5–5.0)
BUN: 11 mg/dL (ref 6–20)
CALCIUM: 9 mg/dL (ref 8.9–10.3)
CO2: 27 mmol/L (ref 22–32)
Chloride: 100 mmol/L — ABNORMAL LOW (ref 101–111)
Creatinine, Ser: 1.13 mg/dL (ref 0.61–1.24)
GFR calc non Af Amer: >60 mL/min (ref >60)
Glucose, Bld: 114 mg/dL — ABNORMAL HIGH (ref 65–99)
Potassium: 4.3 mmol/L (ref 3.5–5.1)
Sodium: 135 mmol/L (ref 135–145)
TOTAL PROTEIN: 5.7 g/dL — AB (ref 6.5–8.1)
Total Bilirubin: 0.6 mg/dL (ref 0.3–1.2)

## 2017-05-30 LAB — URINE CULTURE

## 2017-05-30 LAB — MAGNESIUM: Magnesium: 2 mg/dL (ref 1.7–2.4)

## 2017-05-30 MED ORDER — GADOBENATE DIMEGLUMINE 529 MG/ML IV SOLN
20.0000 mL | Freq: Once | INTRAVENOUS | Status: AC | PRN
  Administered 2017-05-30: 20 mL via INTRAVENOUS

## 2017-05-30 MED ORDER — CLOPIDOGREL BISULFATE 75 MG PO TABS
75.0000 mg | ORAL_TABLET | Freq: Every day | ORAL | Status: DC
  Administered 2017-05-30 – 2017-06-01 (×3): 75 mg via ORAL
  Filled 2017-05-30 (×3): qty 1

## 2017-05-30 MED ORDER — SIMETHICONE 80 MG PO CHEW
80.0000 mg | CHEWABLE_TABLET | Freq: Four times a day (QID) | ORAL | Status: DC | PRN
  Administered 2017-05-30 – 2017-06-01 (×2): 80 mg via ORAL
  Filled 2017-05-30 (×2): qty 1

## 2017-05-30 MED ORDER — LORATADINE 10 MG PO TABS
10.0000 mg | ORAL_TABLET | Freq: Every day | ORAL | Status: DC
  Administered 2017-05-30 – 2017-06-01 (×3): 10 mg via ORAL
  Filled 2017-05-30 (×3): qty 1

## 2017-05-30 MED ORDER — GI COCKTAIL ~~LOC~~
30.0000 mL | Freq: Once | ORAL | Status: AC
  Administered 2017-05-30: 30 mL via ORAL
  Filled 2017-05-30: qty 30

## 2017-05-30 MED ORDER — SALINE SPRAY 0.65 % NA SOLN
1.0000 | NASAL | Status: DC | PRN
  Administered 2017-05-30: 1 via NASAL
  Filled 2017-05-30: qty 44

## 2017-05-30 NOTE — Progress Notes (Signed)
[facility] arrived to transport pt to [facility] for MRI.

## 2017-05-30 NOTE — Progress Notes (Signed)
PROGRESS NOTE    [name] [name].  YSA:[id number]  DOB: Oct 27, 1940  DOA: 05/27/2017 PCP: Redmond [employer], MD   Brief Admission Hx: Jams Trickett. is a 77 y.o. male with BPH and history of rectal bleeding, GERD, OSA, BPH, chronic diastolic heart failure, glaucoma, multiple colonic polyps and colonic diverticulosis, HTN and hypothyroidism presents to ED because he has noticed foul smelling urine for the past 2 days.   He developed nausea this morning, no emesis and had a black tarry stool.  He reportedly had a colonoscopy in 2016 by Dr. Sydell Axon and noted to have multiple colon polyps and diverticulosis and is due for repeat colonoscopy in Jan 2021.  The patient reports that he became concerned because of a foul smelling urine.  He also reports increasing urinary frequency and uncomfortable nocturia.  He reports that he has been urinating at least 5 times per night.   MDM/Assessment & Plan:   1. Acute cystitis- Proteus mirabilis UTI.  Admitted for IV antibiotics and supportive care.  Awaiting urine culture sensitivities.  Continue current treatments.  Following blood cultures: no growth to date 2. Prostatitis - IV levofloxacin ordered and would treat for at least 28 days. MRI pelvis scheduled today at [facility].  3. Elevated PSA - could be elevated from prostatitis, MRI pelvis pending, consider urology consult pending MRI findings although if no inpatient care needed, he has his own urologist in [facility] with [facility].  He will definitely need urology follow up.  He will be transferred to [facility] to have his MRI procedure because he cannot be accommodated on equipment at [facility]. 4. Rectal bleeding - This has been intermittent and an ongoing problem for patient.  He has seen GI and had multiple EGD and colonoscopies done.  His most recent colonoscopy was done in Jan 2016 and multiple polyps and diverticulosis was seen with repeat colonoscopy recommended for 05/2019.  If he  has recurrent bleeding would consult GI.  held plavix for a couple of days. Will restart plavix today and see if he can tolerate it.  If he bleeds again, would involve GI.  Follow Hg and follow clinically.  His hemoglobin has remained stable last few days so I am ok with restarting plavix now.  He denies recurrent melena. 5. Pelvic mass - Scheduled for MRI pelvis to better define.  It is scheduled to be done at [facility] on 1/8.   6. Acute diverticulitis - IV levofloxacin and flagyl ordered.  Follow clinically.  Advanced diet to soft.  7. Diastolic heart dysfunction chronic - stable, compensated, follow clinically.  8. Dehydration Moderate - Gentle IVF hydration ordered.    Now that he is eating and drinking better will cut back on the fluids. 9. Hypothyroidism - resumed home thyroid replacement.   10. Hyperlipidemia - resumed home atorvastatin.   11. Coronary artery disease - stable, resume plavix.    His hemoglobin remains normal.   12. OSA - nightly CPAP ordered but patient has been refusing.    DVT Prophylaxis: SCDs Code Status: DNR  Family Communication: son at bedside  Disposition Plan:  Hopefully home in 1-2 days   Subjective: Pt sitting up in chair.  He ate 100% of his breakfast.  He is preparing to go to [facility] cone for MRI today.    Objective: Vitals:   05/29/17 0533 05/29/17 1509 05/29/17 2141 05/30/17 0511  BP: 126/71 (!) 112/55 109/82 110/62  Pulse: 75 64 73 77  Resp:  15 16 16 16   Temp: 97.8 F (36.6 C) 98.4 F (36.9 C) 98.1 F (36.7 C) 98.2 F (36.8 C)  TempSrc: Oral Oral Oral Oral  SpO2: 92% 94% 93% 94%  Weight:    120.6 kg (265 lb 14 oz)  Height:        Intake/Output Summary (Last 24 hours) at 05/30/2017 0947 Last data filed at 05/30/2017 0630 Gross per 24 hour  Intake 450 ml  Output 3250 ml  Net -2800 ml   Filed Weights   05/27/17 0816 05/27/17 1620 05/30/17 0511  Weight: 120.2 kg (265 lb) 119.9 kg (264 lb 5.3 oz) 120.6 kg (265 lb 14 oz)    REVIEW OF  SYSTEMS  As per history otherwise all reviewed and reported negative  Exam:  General exam: elderly male, awake, alert, NAD. Ambulating in room.  Cooperative.  Respiratory system: BBS CTA. No increased work of breathing. Cardiovascular system: S1 & S2 heard. No JVD, murmurs, gallops, clicks or pedal edema. Gastrointestinal system: Abdomen is nondistended, soft and generalized tenderness. Normal active bowel sounds heard. Central nervous system: Alert and oriented. No focal neurological deficits. Extremities: no CCE.  Data Reviewed: Basic Metabolic Panel: Recent Labs  Lab 05/27/17 0832 05/28/17 0618 05/29/17 0531 05/30/17 0510  NA 138 139 137 135  K 3.7 4.0 4.0 4.3  CL 105 105 103 100*  CO2 25 26 27 27   GLUCOSE 115* 101* 119* 114*  BUN 10 10 9 11   CREATININE 0.95 0.96 0.93 1.13  CALCIUM 9.4 9.1 8.9 9.0  MG  --  1.9 1.9 2.0   Liver Function Tests: Recent Labs  Lab 05/27/17 0832 05/28/17 0618 05/29/17 0531 05/30/17 0510  AST 15 12* 14* 11*  ALT 11* 10* 9* 9*  ALKPHOS 62 57 51 50  BILITOT 0.9 0.7 0.7 0.6  PROT 6.3* 6.1* 5.6* 5.7*  ALBUMIN 3.3* 2.9* 2.7* 2.9*   No results for input(s): LIPASE, AMYLASE in the last 168 hours. No results for input(s): AMMONIA in the last 168 hours. CBC: Recent Labs  Lab 05/27/17 0832 05/28/17 0618 05/29/17 0531 05/30/17 0510  WBC 12.1* 12.6* 11.5* 13.2*  NEUTROABS 10.3* 10.4* 9.1* 10.7*  HGB 14.6 13.9 13.1 13.8  HCT 43.7 42.9 41.2 42.6  MCV 79.2 80.9 81.3 80.8  PLT 115* 110* 109* 111*   Cardiac Enzymes: No results for input(s): CKTOTAL, CKMB, CKMBINDEX, TROPONINI in the last 168 hours. CBG (last 3)  No results for input(s): GLUCAP in the last 72 hours. Recent Results (from the past 240 hour(s))  Urine culture     Status: Abnormal (Preliminary result)   Collection Time: 05/27/17  9:35 AM  Result Value Ref Range Status   Specimen Description URINE, CLEAN CATCH  Final   Special Requests NONE  Final   Culture >=100,000  COLONIES/mL PROTEUS MIRABILIS (A)  Final   Report Status PENDING  Incomplete     Studies: No results found. Scheduled Meds: . atorvastatin  40 mg Oral q1800  . brimonidine  1 drop Both Eyes BID  . calcium carbonate  250 mg of elemental calcium Oral Once per day on Mon Wed Fri  . dorzolamide-timolol  1 drop Both Eyes BID  . ferrous sulfate  325 mg Oral Q breakfast  . gi cocktail  30 mL Oral Once  . latanoprost  1 drop Both Eyes QHS  . magnesium oxide  400 mg Oral Once per day on Mon Thu  . methocarbamol  500 mg Oral BID  . simethicone  80 mg Oral QID  . tamsulosin  0.4 mg Oral QPC supper   Continuous Infusions: . levofloxacin (LEVAQUIN) IV Stopped (05/29/17 1904)  . metronidazole 500 mg (05/30/17 3244)    Active Problems:   Essential hypertension   GASTROESOPHAGEAL REFLUX DISEASE, CHRONIC   Rectal bleeding   Hypothyroidism   Thrombocytopenia (HCC)   Hyperlipidemia   OSA (obstructive sleep apnea)   Diastolic dysfunction by echo Dec 2014   Dysphagia, pharyngoesophageal phase   History of colonic polyps   Gastrointestinal hemorrhage with melena   Acute cystitis   Pelvic mass in male   Acute diverticulitis   Heme positive stool   Prostatitis   S/P colonoscopy  Time spent:   Irwin Brakeman, MD, FAAFP [facility] Pager [phone]  If 7PM-7AM, please contact night-coverage [email or web address] Password [email or web address] 05/30/2017, 9:47 AM    LOS: 3 days

## 2017-05-30 NOTE — Progress Notes (Signed)
Pt arrived back on the floor via [facility].

## 2017-05-31 DIAGNOSIS — E039 Hypothyroidism, unspecified: Secondary | ICD-10-CM

## 2017-05-31 DIAGNOSIS — K5792 Diverticulitis of intestine, part unspecified, without perforation or abscess without bleeding: Secondary | ICD-10-CM

## 2017-05-31 DIAGNOSIS — R195 Other fecal abnormalities: Secondary | ICD-10-CM

## 2017-05-31 DIAGNOSIS — K5793 Diverticulitis of intestine, part unspecified, without perforation or abscess with bleeding: Secondary | ICD-10-CM

## 2017-05-31 DIAGNOSIS — D696 Thrombocytopenia, unspecified: Secondary | ICD-10-CM

## 2017-05-31 DIAGNOSIS — I10 Essential (primary) hypertension: Secondary | ICD-10-CM

## 2017-05-31 DIAGNOSIS — K625 Hemorrhage of anus and rectum: Secondary | ICD-10-CM

## 2017-05-31 DIAGNOSIS — R1900 Intra-abdominal and pelvic swelling, mass and lump, unspecified site: Secondary | ICD-10-CM

## 2017-05-31 DIAGNOSIS — K921 Melena: Secondary | ICD-10-CM

## 2017-05-31 DIAGNOSIS — N410 Acute prostatitis: Secondary | ICD-10-CM

## 2017-05-31 LAB — COMPREHENSIVE METABOLIC PANEL
ALK PHOS: 57 U/L (ref 38–126)
ALT: 10 U/L — AB (ref 17–63)
ANION GAP: 7 (ref 5–15)
AST: 14 U/L — ABNORMAL LOW (ref 15–41)
Albumin: 3 g/dL — ABNORMAL LOW (ref 3.5–5.0)
BILIRUBIN TOTAL: 0.8 mg/dL (ref 0.3–1.2)
BUN: 13 mg/dL (ref 6–20)
CALCIUM: 9.3 mg/dL (ref 8.9–10.3)
CO2: 29 mmol/L (ref 22–32)
CREATININE: 1.12 mg/dL (ref 0.61–1.24)
Chloride: 102 mmol/L (ref 101–111)
Glucose, Bld: 114 mg/dL — ABNORMAL HIGH (ref 65–99)
Potassium: 4.1 mmol/L (ref 3.5–5.1)
SODIUM: 138 mmol/L (ref 135–145)
Total Protein: 5.8 g/dL — ABNORMAL LOW (ref 6.5–8.1)

## 2017-05-31 LAB — CBC WITH DIFFERENTIAL/PLATELET
Basophils Absolute: 0.1 10*3/uL (ref 0.0–0.1)
Basophils Relative: 0 %
EOS ABS: 0.1 10*3/uL (ref 0.0–0.7)
Eosinophils Relative: 1 %
HEMATOCRIT: 43.6 % (ref 39.0–52.0)
HEMOGLOBIN: 14 g/dL (ref 13.0–17.0)
Lymphocytes Relative: 12 %
Lymphs Abs: 1.9 10*3/uL (ref 0.7–4.0)
MCH: 25.8 pg — AB (ref 26.0–34.0)
MCHC: 32.1 g/dL (ref 30.0–36.0)
MCV: 80.4 fL (ref 78.0–100.0)
MONOS PCT: 4 %
Monocytes Absolute: 0.6 10*3/uL (ref 0.1–1.0)
NEUTROS PCT: 83 %
Neutro Abs: 13.1 10*3/uL — ABNORMAL HIGH (ref 1.7–7.7)
Platelets: 113 10*3/uL — ABNORMAL LOW (ref 150–400)
RBC: 5.42 MIL/uL (ref 4.22–5.81)
RDW: 16.5 % — ABNORMAL HIGH (ref 11.5–15.5)
WBC: 15.6 10*3/uL — AB (ref 4.0–10.5)

## 2017-05-31 LAB — MAGNESIUM: MAGNESIUM: 2.1 mg/dL (ref 1.7–2.4)

## 2017-05-31 MED ORDER — FAMOTIDINE 20 MG PO TABS
20.0000 mg | ORAL_TABLET | Freq: Two times a day (BID) | ORAL | Status: DC
  Administered 2017-05-31 – 2017-06-01 (×3): 20 mg via ORAL
  Filled 2017-05-31 (×3): qty 1

## 2017-05-31 MED ORDER — LEVOFLOXACIN 750 MG PO TABS
750.0000 mg | ORAL_TABLET | Freq: Every day | ORAL | Status: DC
  Administered 2017-05-31: 750 mg via ORAL
  Filled 2017-05-31: qty 1

## 2017-05-31 MED ORDER — ALUM & MAG HYDROXIDE-SIMETH 200-200-20 MG/5ML PO SUSP: 30.0000 mL | Freq: Four times a day (QID) | ORAL | Status: DC | PRN

## 2017-05-31 MED ORDER — PANTOPRAZOLE SODIUM 40 MG PO TBEC
40.0000 mg | DELAYED_RELEASE_TABLET | Freq: Every day | ORAL | Status: DC
  Administered 2017-05-31 – 2017-06-01 (×2): 40 mg via ORAL
  Filled 2017-05-31 (×2): qty 1

## 2017-05-31 NOTE — Progress Notes (Addendum)
Physical Therapy Treatment Patient Details Name: [name] [name]. MRN: [id number] DOB: 02-08-1941 Today's Date: 05/31/2017    History of Present Illness Carrick Rijos. is a 77 y.o. male with BPH and history of rectal bleeding, GERD, OSA, BPH, chronic diastolic heart failure, glaucoma, multiple colonic polyps and colonic diverticulosis, HTN and hypothyroidism presents to ED because he has noticed foul smelling urine for the past 2 days.   He developed nausea this morning, no emesis and had a black tarry stool.  He reportedly had a colonoscopy in 2016 by Dr. Sydell Axon and noted to have multiple colon polyps and diverticulosis and is due for repeat colonoscopy in Jan 2021.  The patient reports that he became concerned because of a foul smelling urine.  He also reports increasing urinary frequency and uncomfortable nocturia.  He reports that he has been urinating at least 5 times per night.  He has not been able to sleep well in the last 2 days.  The patient reports pain in the groin area.  He reports burning with urination.  He denies any blood in the urine.    PT Comments    Patient agreeable and motivated for therapy, demonstrates labored movement for sitting up at bedside, ambulated in hallway without loss of balance and tolerated sitting up in chair after therapy.  Patient will benefit from continued physical therapy in hospital and recommended venue below to increase strength, balance, endurance for safe ADLs and gait.  Patient will benefit from use of bariatric bed side commode (BSC) secondary to patient cannot fit into a regular size BSC.    Follow Up Recommendations  Home health PT     Equipment Recommendations  BARIATRIC BED SIDE COMMODE   Recommendations for Other Services       Precautions / Restrictions Precautions Precautions: Fall Restrictions Weight Bearing Restrictions: No    Mobility  Bed Mobility Overal bed mobility: Needs Assistance Bed Mobility: Supine to  Sit     Supine to sit: Min assist     General bed mobility comments: demonstrates labored movement for sitting up at bedside requiring assistance to pull self up  Transfers Overall transfer level: Needs assistance Equipment used: Rolling walker (2 wheeled) Transfers: Sit to/from [facility] Sit to Stand: Supervision Stand pivot transfers: Supervision       General transfer comment: slightly labored movement  Ambulation/Gait Ambulation/Gait assistance: Supervision Ambulation Distance (Feet): 45 Feet Assistive device: Rolling walker (2 wheeled) Gait Pattern/deviations: Decreased step length - right;Decreased step length - left;Decreased stride length   Gait velocity interpretation: Below normal speed for age/gender General Gait Details: slightly labored slow cadence without loss of balance, limited secondary to c/o fatigue   Stairs            Wheelchair Mobility    Modified Rankin (Stroke Patients Only)       Balance Overall balance assessment: Needs assistance Sitting-balance support: No upper extremity supported;Feet supported Sitting balance-Leahy Scale: Good     Standing balance support: Bilateral upper extremity supported;During functional activity Standing balance-Leahy Scale: Fair                              Cognition Arousal/Alertness: Awake/alert Behavior During Therapy: WFL for tasks assessed/performed Overall Cognitive Status: Within Functional Limits for tasks assessed  Exercises General Exercises - Lower Extremity Ankle Circles/Pumps: Seated;AROM;Strengthening;Both;10 reps Long Arc Quad: Seated;AROM;Strengthening;Both;10 reps Hip Flexion/Marching: Seated;AROM;Strengthening;Both;10 reps    General Comments        Pertinent Vitals/Pain Pain Assessment: No/denies pain    Home Living                      Prior Function            PT Goals  (current goals can now be found in the care plan section) Acute Rehab PT Goals Patient Stated Goal: return home PT Goal Formulation: With patient Time For Goal Achievement: 06/07/17 Potential to Achieve Goals: Good Progress towards PT goals: Progressing toward goals    Frequency    Min 3X/week      PT Plan Current plan remains appropriate    Co-evaluation PT/OT/SLP Co-Evaluation/Treatment: Yes            AM-PAC PT "6 Clicks" Daily Activity  Outcome Measure  Difficulty turning over in bed (including adjusting bedclothes, sheets and blankets)?: None Difficulty moving from lying on back to sitting on the side of the bed? : A Little Difficulty sitting down on and standing up from a chair with arms (e.g., wheelchair, bedside commode, etc,.)?: A Little Help needed moving to and from a bed to chair (including a wheelchair)?: A Little Help needed walking in hospital room?: A Little Help needed climbing 3-5 steps with a railing? : A Little 6 Click Score: 19    End of Session   Activity Tolerance: Patient tolerated treatment well;Patient limited by fatigue Patient left: with call bell/phone within reach Nurse Communication: Mobility status PT Visit Diagnosis: Unsteadiness on feet (R26.81);Other abnormalities of gait and mobility (R26.89);Muscle weakness (generalized) (M62.81)     Time: 1009-1040 PT Time Calculation (min) (ACUTE ONLY): 31 min  Charges:  $Gait Training: 8-22 mins $Therapeutic Exercise: 8-22 mins                    G Codes:       11:13 AM, 27-Jun-2017 Lonell Grandchild, MPT Physical Therapist with [facility] [phone] office [phone] mobile phone

## 2017-05-31 NOTE — Care Management Note (Signed)
Case Management Note  Patient Details  Name: [name] [name]. MRN: [id number] Date of Birth: Feb 06, 1941  Subjective/Objective:          Admitted with GIB and being worked up for ? Mass. Pt is from home, lives alone and is mostly ind with ADL's. He has a son who helps with managing medications, shopping and transportation. He has a cane and RW to use as needed. He has hospital bed through [facility]. He has been recommend for [facility] PT. Pt agreeable and has no preference of provider. He has requested that I ask his PCP who they prefer and one that is in network with his insurance. Pt is aware HH has 48 hr to make first visit after DC.          Action/Plan: Anticipate DC home in next 24 hours. CM has contacted Dr. Gerarda Fraction office who says they have no preference other than it not be [facility]. [facility] is in network with [place], they have staff availability and are willing to take referral. Tresea Mall, [facility] rep, aware of referral and will pull pt info from chart. Pt aware of who [facility] provider is and he is agreeable. Concerned about co-pays, [facility] ensures pt will be notified of any co-pays before the visit. Pt on the [facility] registry. He will be referred for [facility] calls.   Expected Discharge Date:       06/01/17           Expected Discharge Plan:  [place]  In-House Referral:  NA  Discharge planning Services  CM Consult  Post Acute Care Choice:  Home Health Choice offered to:  Patient  HH Arranged:  PT [place]:  [place]  Status of Service:  Completed, signed off   Sherald Barge, RN 05/31/2017, 11:16 AM

## 2017-05-31 NOTE — Progress Notes (Addendum)
PROGRESS NOTE    [name] [name].   BDZ:[id number]  DOB: [birth year]  DOA: 05/27/2017 PCP: Redmond [employer], MD   Brief Narrative:   [name] Jimenezis a 77 y.o.malewith BPH and history of rectal bleeding, GERD, OSA, BPH, chronic diastolic heart failure, glaucoma, multiple colonic polyps and colonic diverticulosis, HTN and hypothyroidism presents to ED because he has noticed foul smelling urine, urinary urgency and dysuria for the past 2 days. he is found to have a UTI and on exam has a tender prostate and Urinary retention.  He also complains of having black BMs just prior to coming into the hospital and hemoccult is noted to be +.    Subjective: Feels that urinary discomfort has resolved. No black BMs in the past few days. No abdominal pain but often has indigestion and nausea. No vomiting.  ROS: no complaints of nausea, vomiting, constipation diarrhea, cough, dyspnea or dysuria. No other complaints.   Assessment & Plan:   Principal Problem:   Prostatitis, acute - Urine is growing proteus- based on sensitivities, he is on the appropriate antibiotic - will need 14 days of treatment - folllow up on post void residual - have consulted urology- he is s/p TURP about 5 yrs ago- cont Flomax  Active Problems: Melena and heme + stool - he is on Iron but stool is not typically black - has had prior EGD and colonoscopy in 2016 and internal hemorrhoids, polyps and diverticulosis were found - has upper GI symptoms of indigestion and frequent nausea ? 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 - MRI report reviewed- see below- would ask that his PCP follow up on this- no urgency to do any further work up during this hospital stay  Chronic d CHf - stable  Hypothyroid - cont Synthroid  CAD  - cont Plavix/ statin  Chronic thrombocytopenia  - stable   DVT prophylaxis: SCDs Code Status: DNR Family Communication:  Disposition Plan: home tomorrow Consultants:   Urology consulted today Procedures:    Antimicrobials:  Anti-infectives (From admission, onward)   Start     Dose/Rate Route Frequency Ordered Stop   05/31/17 1800  levofloxacin (LEVAQUIN) tablet 750 mg     750 mg Oral Daily-1800 05/31/17 1232     05/27/17 1700  metroNIDAZOLE (FLAGYL) IVPB 500 mg     500 mg 100 mL/hr over 60 Minutes Intravenous Every 8 hours 05/27/17 1624     05/27/17 1700  levofloxacin (LEVAQUIN) IVPB 750 mg  Status:  Discontinued     750 mg 100 mL/hr over 90 Minutes Intravenous Every 24 hours 05/27/17 1654 05/31/17 1232   05/27/17 1330  cefTRIAXone (ROCEPHIN) 1 g in dextrose 5 % 50 mL IVPB     1 g 100 mL/hr over 30 Minutes Intravenous  Once 05/27/17 1320 05/27/17 1537       Objective: Vitals:   05/30/17 0511 05/30/17 2152 05/31/17 0544 05/31/17 1443  BP: 110/62 (!) 120/52 120/60 127/71  Pulse: 77 94 88 87  Resp: 16  16 16 18   Temp: 98.2 F (36.8 C) 98.4 F (36.9 C) 98.4 F (36.9 C) 97.7 F (36.5 C)  TempSrc: Oral Oral Oral Oral  SpO2: 94% 95% 94% 98%  Weight: 120.6 kg (265 lb 14 oz)  120.6 kg (265 lb 13.3 oz)   Height:        Intake/Output Summary (Last 24 hours) at 05/31/2017 1536 Last data filed at 05/31/2017 1533 Gross per 24 hour  Intake 930 ml  Output 1450 ml  Net -520 ml   Filed Weights   05/27/17 1620 05/30/17 0511 05/31/17 0544  Weight: 119.9 kg (264 lb 5.3 oz) 120.6 kg (265 lb 14 oz) 120.6 kg (265 lb 13.3 oz)    Examination: General exam: Appears comfortable  HEENT: PERRLA, oral mucosa moist, no sclera icterus  or thrush Respiratory system: Clear to auscultation. Respiratory effort normal. Cardiovascular system: S1 & S2 heard, RRR.  No murmurs  Gastrointestinal system: Abdomen soft, non-tender, nondistended. Normal bowel sound. No organomegaly Central nervous system: Alert and oriented. No focal neurological deficits. Extremities: No cyanosis, clubbing or edema Skin: No rashes or ulcers Psychiatry:  Mood & affect appropriate.     Data Reviewed: I have personally reviewed following labs and imaging studies  CBC: Recent Labs  Lab 05/27/17 0832 05/28/17 0618 05/29/17 0531 05/30/17 0510 05/31/17 0432  WBC 12.1* 12.6* 11.5* 13.2* 15.6*  NEUTROABS 10.3* 10.4* 9.1* 10.7* 13.1*  HGB 14.6 13.9 13.1 13.8 14.0  HCT 43.7 42.9 41.2 42.6 43.6  MCV 79.2 80.9 81.3 80.8 80.4  PLT 115* 110* 109* 111* 163*   Basic Metabolic Panel: Recent Labs  Lab 05/27/17 0832 05/28/17 0618 05/29/17 0531 05/30/17 0510 05/31/17 0432  NA 138 139 137 135 138  K 3.7 4.0 4.0 4.3 4.1  CL 105 105 103 100* 102  CO2 25 26 27 27 29   GLUCOSE 115* 101* 119* 114* 114*  BUN 10 10 9 11 13   CREATININE 0.95 0.96 0.93 1.13 1.12  CALCIUM 9.4 9.1 8.9 9.0 9.3  MG  --  1.9 1.9 2.0 2.1   GFR: Estimated Creatinine Clearance: 67.6 mL/min (by C-G formula based on SCr of 1.12 mg/dL). Liver Function Tests: Recent Labs  Lab 05/27/17 0832 05/28/17 0618 05/29/17 0531 05/30/17 0510 05/31/17 0432  AST 15 12* 14* 11* 14*  ALT 11* 10* 9* 9* 10*  ALKPHOS 62 57 51 50 57  BILITOT 0.9 0.7 0.7 0.6 0.8  PROT 6.3* 6.1* 5.6* 5.7* 5.8*  ALBUMIN 3.3* 2.9* 2.7* 2.9* 3.0*   No results for input(s): LIPASE, AMYLASE in the last 168 hours. No results for input(s): AMMONIA in the last 168 hours. Coagulation Profile: No results for input(s): INR, PROTIME in the last 168 hours. Cardiac Enzymes: No results for input(s): CKTOTAL, CKMB, CKMBINDEX, TROPONINI in the last 168 hours. BNP (last 3 results) No results for input(s): PROBNP in the last  8760 hours. HbA1C: No results for input(s): HGBA1C in the last 72 hours. CBG: No results for input(s): GLUCAP in the last 168 hours. Lipid Profile: No results for input(s): CHOL, HDL, LDLCALC, TRIG, CHOLHDL, LDLDIRECT in the last 72 hours. Thyroid Function Tests: No results for input(s): TSH, T4TOTAL, FREET4, T3FREE, THYROIDAB in the last 72 hours. Anemia Panel: No results for input(s): VITAMINB12, FOLATE, FERRITIN, TIBC, IRON, RETICCTPCT in the last 72 hours. Urine analysis:    Component Value Date/Time   COLORURINE YELLOW 05/27/2017 0935   APPEARANCEUR CLEAR 05/27/2017 0935   LABSPEC 1.009 05/27/2017 0935   PHURINE 6.0 05/27/2017  Gregory 05/27/2017 0935   HGBUR MODERATE (A) 05/27/2017 0935   BILIRUBINUR NEGATIVE 05/27/2017 0935   KETONESUR NEGATIVE 05/27/2017 0935   PROTEINUR 30 (A) 05/27/2017 0935   UROBILINOGEN 0.2 09/01/2013 1312   NITRITE NEGATIVE 05/27/2017 0935   LEUKOCYTESUR LARGE (A) 05/27/2017 0935   Sepsis Labs: @LABRCNTIP (procalcitonin:4,lacticidven:4) ) Recent Results (from the past 240 hour(s))  Urine culture     Status: Abnormal   Collection Time: 05/27/17  9:35 AM  Result Value Ref Range Status   Specimen Description URINE, CLEAN CATCH  Final   Special Requests NONE  Final   Culture >=100,000 COLONIES/mL PROTEUS MIRABILIS (A)  Final   Report Status 05/30/2017 FINAL  Final   Organism ID, Bacteria PROTEUS MIRABILIS (A)  Final      Susceptibility   Proteus mirabilis - MIC*    AMPICILLIN <=2 SENSITIVE Sensitive     CEFAZOLIN <=4 SENSITIVE Sensitive     CEFTRIAXONE <=1 SENSITIVE Sensitive     CIPROFLOXACIN <=0.25 SENSITIVE Sensitive     GENTAMICIN <=1 SENSITIVE Sensitive     IMIPENEM 4 SENSITIVE Sensitive     NITROFURANTOIN 128 RESISTANT Resistant     TRIMETH/SULFA <=20 SENSITIVE Sensitive     AMPICILLIN/SULBACTAM <=2 SENSITIVE Sensitive     PIP/TAZO <=4 SENSITIVE Sensitive     * >=100,000 COLONIES/mL PROTEUS MIRABILIS          Radiology Studies: Mr Pelvis W Wo Contrast  Result Date: 05/30/2017 CLINICAL DATA:  Presacral mass EXAM: MRI PELVIS WITHOUT AND WITH CONTRAST TECHNIQUE: Multiplanar multisequence MR imaging of the pelvis was performed both before and after administration of intravenous contrast. CONTRAST:  32mL MULTIHANCE GADOBENATE DIMEGLUMINE 529 MG/ML IV SOLN COMPARISON:  05/27/2017 FINDINGS: Urinary Tract: T2 hyperintense renal lesions are partially seen on the coronal images and probably cysts. Urinary bladder unremarkable. Bowel: Equivocal circumferential wall thickening at the anorectal junction. Vascular/Lymphatic: Unremarkable. No pathologic adenopathy identified. Reproductive: The prostate gland measures 4.3 by 3.2 by 3.7 cm (volume = 27 cm^3). There is slightly more enhancement along the walls of the seminal vesicles on the left side than the right; this is probably incidental. 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 No bony invasion or destruction of the adjacent sacrum. Trace amount of fluid signal along the anterior margin of this lesion at the border with the perirectal space. Sagittally oriented linear fatty element bisects the lesion in the sagittal plane. Musculoskeletal: Lower lumbar posterolateral rod and pedicle screw fixation. IMPRESSION: 1. 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 Trace edema in the adjacent fascia plane with the perirectal space. There was some faint nodularity in this vicinity in 2015 but no abnormality in the area prior to that. The lesion has slowly enlarged and seems to have a potentially linear fatty element extending sagittally in the right paracentral position. Given the fatty element, possibilities may include myelolipoma, extramedullary  hematopoiesis, solitary fibrous tumor, low-grade lymphoma, or soft tissue hemangioma. Given the slow growth and fatty elements this would be unusual for metastatic disease. Appearance not considered characteristic for chordoma and would be unusual for a neurogenic origin tumor such as schwannoma based on the intermediate T2 signal. Consider surveillance or resection. Electronically Signed   By: Van Clines M.D.   On: 05/30/2017 15:57   Dg Chest [facility]  1 View  Result Date: 05/30/2017 CLINICAL DATA:  LEUKOCYTOSIS EXAM: PORTABLE CHEST 1 VIEW COMPARISON:  09/12/2016 FINDINGS: Midline trachea. Normal heart size for level of inspiration. No pleural effusion or pneumothorax. Low lung volumes. Minimal subsegmental atelectasis at the lung bases. IMPRESSION: Low lung volumes without acute disease. Electronically Signed   By: Abigail Miyamoto M.D.   On: 05/30/2017 10:19      Scheduled Meds: . atorvastatin  40 mg Oral q1800  . brimonidine  1 drop Both Eyes BID  . calcium carbonate  250 mg of elemental calcium Oral Once per day on Mon Wed Fri  . clopidogrel  75 mg Oral Daily  . dorzolamide-timolol  1 drop Both Eyes BID  . famotidine  20 mg Oral BID  . ferrous sulfate  325 mg Oral Q breakfast  . latanoprost  1 drop Both Eyes QHS  . levofloxacin  750 mg Oral q1800  . loratadine  10 mg Oral Daily  . magnesium oxide  400 mg Oral Once per day on Mon Thu  . methocarbamol  500 mg Oral BID  . pantoprazole  40 mg Oral Daily  . tamsulosin  0.4 mg Oral QPC supper   Continuous Infusions: . metronidazole Stopped (05/31/17 0925)     LOS: 4 days    Time spent in minutes: [place], MD [facility] Pager: [email or web address] Password [email or web address] 05/31/2017, 3:36 PM

## 2017-05-31 NOTE — Care Management Important Message (Signed)
Important Message  Patient Details  Name: [name] [name]. MRN: [id number] Date of Birth: 1941/01/24   Medicare Important Message Given:  Yes    Sherald Barge, RN 05/31/2017, 11:22 AM

## 2017-05-31 NOTE — Progress Notes (Signed)
Pharmacy Antibiotic Note  [name] [name]. is a 77 y.o. male admitted on 05/27/2017 with UTI and prostatitis  Pharmacy has been consulted for levaquin dosing.  Plan: Levaquin 750 mg PO q24 hours F/u renal function, cultures and clinical course  Height: 5\' 6"  (167.6 cm) Weight: 265 lb 13.3 oz (120.6 kg) IBW/kg (Calculated) : 63.8  Temp (24hrs), Avg:98.4 F (36.9 C), Min:98.4 F (36.9 C), Max:98.4 F (36.9 C)  Recent Labs  Lab 05/27/17 0832 05/28/17 0618 05/29/17 0531 05/30/17 0510 05/31/17 0432  WBC 12.1* 12.6* 11.5* 13.2* 15.6*  CREATININE 0.95 0.96 0.93 1.13 1.12    Estimated Creatinine Clearance: 67.6 mL/min (by C-G formula based on SCr of 1.12 mg/dL).    Allergies  Allergen Reactions  . Aspirin Other (See Comments)    Nausea and upset stomach    . Nexium [Esomeprazole Magnesium] Other (See Comments)    Patient said it messed his stomach up   Antimicrobials this admission: levaquin 1/5 >>  flagyl 1/5 >>   PHARMACIST - PHYSICIAN COMMUNICATION CONCERNING: Antibiotic IV to Oral Route Change Policy  RECOMMENDATION: This patient is receiving LEVAQUIN by the intravenous route.  Based on criteria approved by the Pharmacy and Therapeutics Committee, the antibiotic(s) is/are being converted to the equivalent oral dose form(s).  DESCRIPTION: These criteria include:  Patient being treated for a respiratory tract infection, urinary tract infection, cellulitis or clostridium difficile associated diarrhea if on metronidazole  The patient is not neutropenic and does not exhibit a GI malabsorption state  The patient is eating (either orally or via tube) and/or has been taking other orally administered medications for a least 24 hours  The patient is improving clinically and has a Tmax < 100.5  If you have questions about this conversion, please contact the Pharmacy Department  [x]   [phone] )  [facility] []   [phone] )  [facility] []   [phone] )  [facility] []   [phone] )  [facility] []   [phone] )  [facility]   Thank you for allowing pharmacy to be a part of this patient's care.  Hart Robinsons A 05/31/2017 12:30 PM

## 2017-06-01 DIAGNOSIS — N410 Acute prostatitis: Secondary | ICD-10-CM | POA: Diagnosis not present

## 2017-06-01 DIAGNOSIS — E782 Mixed hyperlipidemia: Secondary | ICD-10-CM

## 2017-06-01 DIAGNOSIS — N390 Urinary tract infection, site not specified: Secondary | ICD-10-CM | POA: Diagnosis not present

## 2017-06-01 DIAGNOSIS — N3000 Acute cystitis without hematuria: Principal | ICD-10-CM

## 2017-06-01 DIAGNOSIS — N419 Inflammatory disease of prostate, unspecified: Secondary | ICD-10-CM

## 2017-06-01 MED ORDER — SIMETHICONE 80 MG PO CHEW: 80.0000 mg | CHEWABLE_TABLET | Freq: Four times a day (QID) | ORAL | 0 refills | Status: DC | PRN

## 2017-06-01 MED ORDER — FAMOTIDINE 20 MG PO TABS: 20.0000 mg | ORAL_TABLET | Freq: Two times a day (BID) | ORAL | 0 refills | Status: DC

## 2017-06-01 MED ORDER — SULFAMETHOXAZOLE-TRIMETHOPRIM 800-160 MG PO TABS
1.0000 | ORAL_TABLET | Freq: Two times a day (BID) | ORAL | Status: DC
  Administered 2017-06-01: 1 via ORAL
  Filled 2017-06-01: qty 1

## 2017-06-01 MED ORDER — PANTOPRAZOLE SODIUM 40 MG PO TBEC: 40.0000 mg | DELAYED_RELEASE_TABLET | Freq: Every day | ORAL | 0 refills | Status: DC

## 2017-06-01 MED ORDER — ACETAMINOPHEN 325 MG PO TABS: 650.0000 mg | ORAL_TABLET | Freq: Four times a day (QID) | ORAL | Status: AC | PRN

## 2017-06-01 MED ORDER — SULFAMETHOXAZOLE-TRIMETHOPRIM 800-160 MG PO TABS: 1.0000 | ORAL_TABLET | Freq: Two times a day (BID) | ORAL | 0 refills | Status: DC

## 2017-06-01 NOTE — Discharge Summary (Signed)
Physician Discharge Summary  [name] [name]. MWN:[id number] DOB: Nov 28, 1940 DOA: 05/27/2017  PCP: [name] School, MD  Admit date: 05/27/2017 Discharge date: 06/01/2017  Admitted From: home Disposition:  home   Recommendations for Outpatient Follow-up:  1. 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 F/u on GI issues- has been started on Protonix and [place]:  ordered  Equipment/Devices:  [name]    Discharge Condition:  stable   CODE STATUS:  DNR   Consultations:  urology    Discharge Diagnoses:  Principal Problem:   Prostatitis Active Problems:   Gastrointestinal hemorrhage with melena   Acute cystitis   Pelvic mass in male   Acute diverticulitis   Heme positive stool   Essential hypertension   GASTROESOPHAGEAL REFLUX DISEASE, CHRONIC   Hypothyroidism   Thrombocytopenia (HCC)   Hyperlipidemia   OSA (obstructive sleep apnea)   Diastolic dysfunction by echo Dec 2014   Dysphagia, pharyngoesophageal phase   History of colonic polyps   S/P colonoscopy    Subjective: No trouble urinating. No bloody stools. Has indigestion and mild nausea after having to take all of his pills at once. No other complaints.   Brief Summary: [name] Jimenezis a 77 y.o.malewith BPH and history of rectal bleeding, GERD, OSA, BPH, chronic diastolic heart failure, glaucoma, multiple colonic polyps and colonic diverticulosis, HTN and hypothyroidism presents to ED because he has noticed foul smelling urine, urinary urgency and dysuria for the past 2 days. he is found to have a UTI and on exam has a tender prostate and Urinary retention.  He also complains of having black BMs just prior to coming into the hospital and hemoccult is noted to be +.     Hospital Course:  Principal Problem:   Prostatitis, acute - Urine is growing proteus-   - have consulted urology- he is s/p TURP about 5 yrs ago- cont Flomax - he has been evaluated by Dr Alyson Ingles who recommends 28 days of  Bactrim which I have prescribed for him- he will see the patient in the office to address the BPH  Active Problems: Melena and heme + stool - he is on Iron but stool is not typically black - has had prior EGD and colonoscopy in 2016 and internal hemorrhoids, polyps and diverticulosis were found - has upper GI symptoms of indigestion and frequent nausea ? 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5 C) 97.7 F (36.5 C) 97.8 F (36.6 C)  TempSrc: Oral Oral  Oral  SpO2: 94% 98% 96% 94%  Weight: 120.6 kg (265 lb 13.3 oz)     Height:        General: Pt is alert, awake, not in acute distress Cardiovascular: RRR, S1/S2 +, no rubs, no gallops Respiratory: CTA bilaterally, no wheezing, no rhonchi Abdominal: Soft, NT, ND, bowel sounds + Extremities: no edema, no cyanosis   Discharge  Instructions  Discharge Instructions    Diet - low sodium heart healthy   Complete by:  As directed    Increase activity slowly   Complete by:  As directed      Allergies as of 06/01/2017      Reactions   Aspirin Other (See Comments)   Nausea and upset stomach     Nexium [esomeprazole Magnesium] Other (See Comments)   Patient said it messed his stomach up      Medication List    TAKE these medications   acetaminophen 325 MG tablet Commonly known as:  TYLENOL Take 2 tablets (650 mg total) by mouth every 6 (six) hours as needed for mild pain (or Fever >/= 101).   atorvastatin 40 MG tablet Commonly known as:  LIPITOR Take 1 tablet (40 mg total) by mouth daily at 6 PM.   BIOFREEZE 4 % Gel Generic drug:  Menthol (Topical Analgesic) To be used by therapy dept as needed   bisacodyl 5 MG EC tablet Commonly known as:  DULCOLAX Take 1 tablet (5 mg total) by mouth daily.   brimonidine 0.2 % ophthalmic solution Commonly known as:  ALPHAGAN Place 1 drop into both eyes 2 (two) times daily.   calcium citrate 950 MG tablet Commonly known as:  CALCITRATE - dosed in mg elemental calcium Take 200 mg of elemental calcium by mouth daily. Takes 3 times per week   clopidogrel 75 MG tablet Commonly known as:  PLAVIX Take 1 tablet (75 mg total) by mouth daily.   diclofenac sodium 1 % Gel Commonly known as:  VOLTAREN Apply 4 g 4 (four) times daily as needed topically.   dorzolamide-timolol 22.3-6.8 MG/ML ophthalmic solution Commonly known as:  COSOPT Place 1 drop into both eyes 2 (two) times daily.   famotidine 20 MG tablet Commonly known as:  PEPCID Take 1 tablet (20 mg total) by mouth 2 (two) times daily.   ferrous sulfate 325 (65 FE) MG tablet Take 325 mg by mouth daily with breakfast.   latanoprost 0.005 % ophthalmic solution Commonly known as:  XALATAN Place 1 drop into both eyes at bedtime.   LUBRICANT EYE DROPS 0.4-0.3 % Soln Generic drug:  Polyethyl Glycol-Propyl  Glycol Place 1 drop into both eyes daily as needed (for lubricant eye drops).   magnesium oxide 400 MG tablet Commonly known as:  MAG-OX Take 400 mg by mouth daily. Takes 2 times per week   methocarbamol 500 MG tablet Commonly known as:  ROBAXIN Take 1 tablet (500 mg total) by mouth 2 (two) times daily.   Oxycodone HCl 10 MG Tabs Take 1 tablet (10 mg total) by mouth 2 (two) times daily as needed (pain).   pantoprazole 40 MG tablet Commonly known as:  PROTONIX Take 1 tablet (40 mg total) by  mouth daily. Start taking on:  06/02/2017   potassium chloride 10 MEQ tablet Commonly known as:  K-DUR,KLOR-CON Take 40 mEq by mouth 2 (two) times daily.   simethicone 80 MG chewable tablet Commonly known as:  MYLICON Chew 1 tablet (80 mg total) by mouth 4 (four) times daily as needed for flatulence.   sulfamethoxazole-trimethoprim 800-160 MG tablet Commonly known as:  BACTRIM DS,SEPTRA DS Take 1 tablet by mouth every 12 (twelve) hours.   tamsulosin 0.4 MG Caps capsule Commonly known as:  FLOMAX Take 0.4 mg by mouth daily after supper.   traMADol 50 MG tablet Commonly known as:  ULTRAM Take 1 tablet (50 mg total) every 12 (twelve) hours as needed by mouth.   Vitamin D3 1000 units Caps Take 1 capsule by mouth. Takes 3 times per week            Durable Medical Equipment  (From admission, onward)        Start     Ordered   06/01/17 0826  For home use only DME 3 n 1  Once    Comments:  Needs bariatric size as pt is too wide for standard 3 in 1   06/01/17 0826      Allergies  Allergen Reactions  . Aspirin Other (See Comments)    Nausea and upset stomach    . Nexium [Esomeprazole Magnesium] Other (See Comments)    Patient said it messed his stomach up     Procedures/Studies:    Mr Pelvis W Wo Contrast  Result Date: 05/30/2017 CLINICAL DATA:  Presacral mass EXAM: MRI PELVIS WITHOUT AND WITH CONTRAST TECHNIQUE: Multiplanar multisequence MR imaging of the pelvis was  performed both before and after administration of intravenous contrast. CONTRAST:  49mL MULTIHANCE GADOBENATE DIMEGLUMINE 529 MG/ML IV SOLN COMPARISON:  05/27/2017 FINDINGS: Urinary Tract: T2 hyperintense renal lesions are partially seen on the coronal images and probably cysts. Urinary bladder unremarkable. Bowel: Equivocal circumferential wall thickening at the anorectal junction. Vascular/Lymphatic: Unremarkable. No pathologic adenopathy identified. Reproductive: The prostate gland measures 4.3 by 3.2 by 3.7 cm (volume = 27 cm^3). There is slightly more enhancement along the walls of the seminal vesicles on the left side than the right; this is probably incidental. 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 No bony invasion or destruction of the adjacent sacrum. Trace amount of fluid signal along the anterior margin of this lesion at the border with the perirectal space. Sagittally oriented linear fatty element bisects the lesion in the sagittal plane. Musculoskeletal: Lower lumbar posterolateral rod and pedicle screw fixation. IMPRESSION: 1. 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 Trace edema in the adjacent fascia plane with the perirectal space. There was some faint nodularity in this vicinity in 2015 but no abnormality in the area prior to that. The lesion has slowly enlarged and seems to have a potentially linear fatty element extending sagittally in the right paracentral position. Given the fatty element, possibilities may include myelolipoma, extramedullary hematopoiesis, solitary fibrous tumor, low-grade lymphoma, or soft tissue hemangioma. Given the slow growth and fatty elements this would be unusual for metastatic disease. Appearance not considered characteristic for  chordoma and would be unusual for a neurogenic origin tumor such as schwannoma based on the intermediate T2 signal. Consider surveillance or resection. Electronically Signed  By: Van Clines M.D.   On: 05/30/2017 15:57   Ct Abdomen Pelvis W Contrast  Result Date: 05/27/2017 CLINICAL DATA:  Patient with this urea.  Nausea. EXAM: CT ABDOMEN AND PELVIS WITH CONTRAST TECHNIQUE: Multidetector CT imaging of the abdomen and pelvis was performed using the standard protocol following bolus administration of intravenous contrast. CONTRAST:  174mL ISOVUE-300 IOPAMIDOL (ISOVUE-300) INJECTION 61% COMPARISON:  CT abdomen pelvis 02/27/2017. FINDINGS: Lower chest: Normal heart size. Dependent atelectasis within the bilateral lower lobes. No pleural effusion. Hepatobiliary: The liver is normal in size and contour. Stable subcentimeter low-attenuation lesion hepatic dome (image 14; series 2) with suggestion of peripheral nodular enhancement. There is an additional lesion within the right hepatic lobe (image 19; series 2) which may represent hemangioma. Gallbladder surgically absent. No intrahepatic or extrahepatic biliary ductal dilatation. Pancreas: Unremarkable Spleen: Mildly enlarged measuring 15.4 cm. Adrenals/Urinary Tract: Normal adrenal glands. Kidneys enhance symmetrically with contrast. Unchanged low-attenuation renal lesions bilaterally, the largest of which are most compatible with cysts. There is wall thickening of the urinary bladder. Multiple left parapelvic cysts. Stomach/Bowel: Mild wall thickening of the rectum. There is focal bowel wall /mass like thickening and surrounding fat stranding at the level of the cecum/terminal ileum (image 54; series 2). The appendix is normal. Normal morphology of the stomach. No evidence for small bowel obstruction. No free fluid or free intraperitoneal air. Vascular/Lymphatic: Normal caliber abdominal aorta. Peripheral calcified atherosclerotic plaque. No retroperitoneal  lymphadenopathy. Reproductive: Prostate is enlarged and heterogeneous in attenuation. Other: Small fat containing left inguinal hernia. Fat containing periumbilical hernia. Musculoskeletal: Stable to slight interval increase in size of nonspecific soft tissue within the presacral location (image 85; series 2) measuring 2.0 x 2.5 cm, previously 2.5 x 1.8 cm. Lumbar spinal fusion hardware. Lower thoracic and lumbar spine degenerative changes. IMPRESSION: 1. Focal inflamed masslike area involving the cecum at the level of the terminal ileum. Findings may represent focal cecal diverticulitis, an infectious or inflammatory process or potentially cecal mass/neoplastic process. Recommend clinical and laboratory correlation. Additionally, patient will need further evaluation with colonoscopy after resolution of the acute symptomatology. 2. Mild wall thickening of the rectum, potentially sequelae of infectious process. 3. Wall thickening of the urinary bladder raising the possibility of cystitis. 4. Re- demonstrated slowly enlarging soft tissue mass within the presacral location. Recommend further evaluation with pelvic MRI. 5. Mild splenomegaly. Electronically Signed   By: Lovey Newcomer M.D.   On: 05/27/2017 12:30   Dg Chest Port 1 View  Result Date: 05/30/2017 CLINICAL DATA:  LEUKOCYTOSIS EXAM: PORTABLE CHEST 1 VIEW COMPARISON:  09/12/2016 FINDINGS: Midline trachea. Normal heart size for level of inspiration. No pleural effusion or pneumothorax. Low lung volumes. Minimal subsegmental atelectasis at the lung bases. IMPRESSION: Low lung volumes without acute disease. Electronically Signed   By: Abigail Miyamoto M.D.   On: 05/30/2017 10:19     The results of significant diagnostics from this hospitalization (including imaging, microbiology, ancillary and laboratory) are listed below for reference.     Microbiology: Recent Results (from the past 240 hour(s))  Urine culture     Status: Abnormal   Collection Time: 05/27/17   9:35 AM  Result Value Ref Range Status   Specimen Description URINE, CLEAN CATCH  Final   Special Requests NONE  Final   Culture >=100,000 COLONIES/mL PROTEUS MIRABILIS (A)  Final   Report Status 05/30/2017 FINAL  Final   Organism ID, Bacteria PROTEUS MIRABILIS (A)  Final      Susceptibility  Proteus mirabilis - MIC*    AMPICILLIN <=2 SENSITIVE Sensitive     CEFAZOLIN <=4 SENSITIVE Sensitive     CEFTRIAXONE <=1 SENSITIVE Sensitive     CIPROFLOXACIN <=0.25 SENSITIVE Sensitive     GENTAMICIN <=1 SENSITIVE Sensitive     IMIPENEM 4 SENSITIVE Sensitive     NITROFURANTOIN 128 RESISTANT Resistant     TRIMETH/SULFA <=20 SENSITIVE Sensitive     AMPICILLIN/SULBACTAM <=2 SENSITIVE Sensitive     PIP/TAZO <=4 SENSITIVE Sensitive     * >=100,000 COLONIES/mL PROTEUS MIRABILIS     Labs: BNP (last 3 results) No results for input(s): BNP in the last 8760 hours. Basic Metabolic Panel: Recent Labs  Lab 05/27/17 0832 05/28/17 0618 05/29/17 0531 05/30/17 0510 05/31/17 0432  NA 138 139 137 135 138  K 3.7 4.0 4.0 4.3 4.1  CL 105 105 103 100* 102  CO2 25 26 27 27 29   GLUCOSE 115* 101* 119* 114* 114*  BUN 10 10 9 11 13   CREATININE 0.95 0.96 0.93 1.13 1.12  CALCIUM 9.4 9.1 8.9 9.0 9.3  MG  --  1.9 1.9 2.0 2.1   Liver Function Tests: Recent Labs  Lab 05/27/17 0832 05/28/17 0618 05/29/17 0531 05/30/17 0510 05/31/17 0432  AST 15 12* 14* 11* 14*  ALT 11* 10* 9* 9* 10*  ALKPHOS 62 57 51 50 57  BILITOT 0.9 0.7 0.7 0.6 0.8  PROT 6.3* 6.1* 5.6* 5.7* 5.8*  ALBUMIN 3.3* 2.9* 2.7* 2.9* 3.0*   No results for input(s): LIPASE, AMYLASE in the last 168 hours. No results for input(s): AMMONIA in the last 168 hours. CBC: Recent Labs  Lab 05/27/17 0832 05/28/17 0618 05/29/17 0531 05/30/17 0510 05/31/17 0432  WBC 12.1* 12.6* 11.5* 13.2* 15.6*  NEUTROABS 10.3* 10.4* 9.1* 10.7* 13.1*  HGB 14.6 13.9 13.1 13.8 14.0  HCT 43.7 42.9 41.2 42.6 43.6  MCV 79.2 80.9 81.3 80.8 80.4  PLT 115* 110*  109* 111* 113*   Cardiac Enzymes: No results for input(s): CKTOTAL, CKMB, CKMBINDEX, TROPONINI in the last 168 hours. BNP: Invalid input(s): POCBNP CBG: No results for input(s): GLUCAP in the last 168 hours. D-Dimer No results for input(s): DDIMER in the last 72 hours. Hgb A1c No results for input(s): HGBA1C in the last 72 hours. Lipid Profile No results for input(s): CHOL, HDL, LDLCALC, TRIG, CHOLHDL, LDLDIRECT in the last 72 hours. Thyroid function studies No results for input(s): TSH, T4TOTAL, T3FREE, THYROIDAB in the last 72 hours.  Invalid input(s): FREET3 Anemia work up No results for input(s): VITAMINB12, FOLATE, FERRITIN, TIBC, IRON, RETICCTPCT in the last 72 hours. Urinalysis    Component Value Date/Time   COLORURINE YELLOW 05/27/2017 0935   APPEARANCEUR CLEAR 05/27/2017 0935   LABSPEC 1.009 05/27/2017 0935   PHURINE 6.0 05/27/2017 0935   GLUCOSEU NEGATIVE 05/27/2017 0935   HGBUR MODERATE (A) 05/27/2017 0935   BILIRUBINUR NEGATIVE 05/27/2017 0935   KETONESUR NEGATIVE 05/27/2017 0935   PROTEINUR 30 (A) 05/27/2017 0935   UROBILINOGEN 0.2 09/01/2013 1312   NITRITE NEGATIVE 05/27/2017 0935   LEUKOCYTESUR LARGE (A) 05/27/2017 0935   Sepsis Labs Invalid input(s): PROCALCITONIN,  WBC,  LACTICIDVEN Microbiology Recent Results (from the past 240 hour(s))  Urine culture     Status: Abnormal   Collection Time: 05/27/17  9:35 AM  Result Value Ref Range Status   Specimen Description URINE, CLEAN CATCH  Final   Special Requests NONE  Final   Culture >=100,000 COLONIES/mL PROTEUS MIRABILIS (A)  Final   Report Status 05/30/2017  FINAL  Final   Organism ID, Bacteria PROTEUS MIRABILIS (A)  Final      Susceptibility   Proteus mirabilis - MIC*    AMPICILLIN <=2 SENSITIVE Sensitive     CEFAZOLIN <=4 SENSITIVE Sensitive     CEFTRIAXONE <=1 SENSITIVE Sensitive     CIPROFLOXACIN <=0.25 SENSITIVE Sensitive     GENTAMICIN <=1 SENSITIVE Sensitive     IMIPENEM 4 SENSITIVE Sensitive      NITROFURANTOIN 128 RESISTANT Resistant     TRIMETH/SULFA <=20 SENSITIVE Sensitive     AMPICILLIN/SULBACTAM <=2 SENSITIVE Sensitive     PIP/TAZO <=4 SENSITIVE Sensitive     * >=100,000 COLONIES/mL PROTEUS MIRABILIS     Time coordinating discharge: Over 30 minutes  SIGNED:   Debbe Odea, MD  [facility] 06/01/2017, 2:15 PM Pager   If 7PM-7AM, please contact night-coverage [email or web address] Password [email or web address]

## 2017-06-01 NOTE — Progress Notes (Addendum)
Physical Therapy Treatment Patient Details Name: [name] [name]. MRN: [id number] DOB: [birth year] Today's Date: 06/01/2017    History of Present Illness [name] [name]. is a 77 y.o. male with BPH and history of rectal bleeding, GERD, OSA, BPH, chronic diastolic heart failure, glaucoma, multiple colonic polyps and colonic diverticulosis, HTN and hypothyroidism presents to ED because he has noticed foul smelling urine for the past 2 days.   He developed nausea this morning, no emesis and had a black tarry stool.  He reportedly had a colonoscopy in 2016 by Dr. Sydell Axon and noted to have multiple colon polyps and diverticulosis and is due for repeat colonoscopy in Jan 2021.  The patient reports that he became concerned because of a foul smelling urine.  He also reports increasing urinary frequency and uncomfortable nocturia.  He reports that he has been urinating at least 5 times per night.  He has not been able to sleep well in the last 2 days.  The patient reports pain in the groin area.  He reports burning with urination.  He denies any blood in the urine.    PT Comments    Pt supine in bed with RN taking vitals as therapist entered room.  Pt friendly and willing to participate with treatment.  Pt independent with bed mobility and transfers following cueing for handplacement for assistance.  Increased distance with gait training with supervision, no LOB episodes during session, did c/o Rt knee pain during gait though pain was resolved when re-entered room.  Pt left in chair with call bell within reach.    Follow Up Recommendations        Equipment Recommendations    Recommend bariatric [facility]   Recommendations for Other Services       Precautions / Restrictions Precautions Precautions: Fall    Mobility  Bed Mobility Overal bed mobility: Independent             General bed mobility comments: Independent supine to sit with no assistance  Transfers Overall transfer level:  Modified independent Equipment used: Rolling walker (2 wheeled) Transfers: Sit to/from Stand Sit to Stand: Supervision         General transfer comment: able to complete independently following cueing for handplacement to assist  Ambulation/Gait Ambulation/Gait assistance: Supervision Ambulation Distance (Feet): 56 Feet Assistive device: Rolling walker (2 wheeled) Gait Pattern/deviations: Decreased step length - right;Decreased step length - left;Decreased stride length     General Gait Details: no LOB, increased distance with gait, did c/o Rt knee pain with increased distance and fatigue, no LOB episodes   Stairs            Wheelchair Mobility    Modified Rankin (Stroke Patients Only)       Balance                                            Cognition Arousal/Alertness: Awake/alert Behavior During Therapy: WFL for tasks assessed/performed Overall Cognitive Status: Within Functional Limits for tasks assessed                                        Exercises      General Comments        Pertinent Vitals/Pain Pain Assessment: 0-10 Pain Score: 7  Pain Location:  stomach ache Pain Descriptors / Indicators: Aching Pain Intervention(s): Limited activity within patient's tolerance;Premedicated before session;Monitored during session;Repositioned    Home Living                      Prior Function            PT Goals (current goals can now be found in the care plan section)      Frequency           PT Plan Current plan remains appropriate    Co-evaluation              AM-PAC PT "6 Clicks" Daily Activity  Outcome Measure  Difficulty turning over in bed (including adjusting bedclothes, sheets and blankets)?: None Difficulty moving from lying on back to sitting on the side of the bed? : A Little Difficulty sitting down on and standing up from a chair with arms (e.g., wheelchair, bedside commode,  etc,.)?: A Little Help needed moving to and from a bed to chair (including a wheelchair)?: A Little Help needed walking in hospital room?: A Little Help needed climbing 3-5 steps with a railing? : A Little 6 Click Score: 19    End of Session Equipment Utilized During Treatment: Gait belt Activity Tolerance: Patient tolerated treatment well;Patient limited by fatigue Patient left: with call bell/phone within reach Nurse Communication: Mobility status       Time: 1325-1345 PT Time Calculation (min) (ACUTE ONLY): 20 min  Charges:  $Therapeutic Activity: 23-37 mins                    G Codes:       Ihor Austin, LPTA; CBIS [phone]   Aldona Lento 06/01/2017, 6:30 PM

## 2017-06-01 NOTE — Care Management Note (Addendum)
Case Management Note  Patient Details  Name: [name] [name]. MRN: [id number] Date of Birth: 12/21/1940   Expected Discharge Date:     06/01/2017             Expected Discharge Plan:  [place]  In-House Referral:  NA  Discharge planning Services  CM Consult  Post Acute Care Choice:  Home Health, Durable Medical Equipment Choice offered to:  Patient  DME Arranged:  3-N-1 DME Agency:  [place]:  PT [place]:  [place]  Status of Service:  Completed, signed off  If discussed at [place] of Stay Meetings, dates discussed:  06/01/17  Additional Comments: Pt discharging home today with [facility] PT. [facility] came back with 40% co-pay for paitent, [facility] will have $0. Pt aware and has chosen [facility] despite PCP preference. [facility] rep aware of change in plan. Pt will also need bariatric 3 in 1 as the [facility] he has pta he is not able to use due to his size. Blake Divine, [facility] rep aware of referral and will pull pt info from chart and deliver DME to pt room prior to DC.   Sherald Barge, RN 06/01/2017, 8:29 AM

## 2017-06-01 NOTE — Consult Note (Signed)
Urology Consult  Referring physician: Dr. Wynelle Cleveland Reason for referral: Prostatitis, UTI  Chief Complaint: pelvic pain  History of Present Illness: [name] [name] is a 77yo with a history of BPH admitted with UTI and suprapubic/pelvic pain. He has a longstanding history of BPH dating back to 2002 and underwent TURP 5 years ago. He recently saw me in 02/2017 for worsening nocturia and OAB symptoms. He was started on flomax and was doing well until 05/24/2017 when he developed severe, sharp, constant, nonradiating pelvic pain with associated worsening of his urgency, frequency, and nocturia. He presented to the ER and was admitted for UTI and concern for sepsis. Urine culture grew proteus and he is currently on levaquin. Currently he has dull constant, moderate nonradiaitng pelvic pain which is improving. He does not have a foley catheter. No other associated symptoms. No exacerbating/allevaiting events  Past Medical History:  Diagnosis Date  . Assistance needed for mobility    cane, walker  . Bilateral lower leg cellulitis   . BPH (benign prostatic hyperplasia)   . Cataract    left  . Chronic diastolic HF (heart failure) ([place])   . Chronic low back pain   . Collagen vascular disease ([place])    history of venous insufficency and LE cellulitis  . Complication of anesthesia    2012 due to sleep apnea pt has had difficulty being put under  and waking up from anesthesia  . Deep venous insufficiency   . GERD (gastroesophageal reflux disease)   . Glaucoma    left  . Headache   . Heel spur    right heel  . History of bladder infections   . History of colonic polyps   . Hyperlipidemia   . Hypertension    Sees Dr. Debara Pickett  . Hypothyroidism    hypothyroid denies no rx  . Iron deficiency 07/07/2016  . Kidney stones    passed them  . Obesity   . Osteoarthritis   . PONV (postoperative nausea and vomiting)   . PUD (peptic ulcer disease)   . Ringing in ears   . S/P colonoscopy 2007, Feb and March 2012    hx of adenomas, left-sided diverticula, On 07/2010 TCS, fresh blood and clot noted coming from TI.  . S/P endoscopy 2007, 2012   2007: nl, May 2012: antral erosions  . SIRS (systemic inflammatory response syndrome) ([place]) 02/27/2012  . Sleep apnea    supposed to see Dr. Luan Pulling to get it started back, hasn't been on in since 2018  . Thrombocytopenia ([place]) 12/30/2011   Stable   Past Surgical History:  Procedure Laterality Date  . BACK SURGERY    . CARDIAC CATHETERIZATION  02/27/2007   no sign of CAD, LVH, mod pulm HTN (Dr. Jackie Plum(  . CATARACT EXTRACTION W/ INTRAOCULAR LENS IMPLANT Right ~ 2012  . COLONOSCOPY N/A 05/28/2014   HUD:[id number] colonic polyps/colonic diverticulosis. Tubular adenomas. Next colonoscopy January 2021  . ESOPHAGOGASTRODUODENOSCOPY  Aug 2013   [facility], single 35m pedunculated polyp otherwise normal. HYPERPLASTIC, NEGATIVE h.pylori  . ESOPHAGOGASTRODUODENOSCOPY N/A 05/28/2014   RMR:non-critical Schatzki's ring/HH. Benign gastritis  . INGUINAL HERNIA REPAIR Right 1941  . KNEE ARTHROSCOPY Left ~ 2000  . LAPAROSCOPIC CHOLECYSTECTOMY    . [place]  [place]  . MALONEY DILATION N/A 05/28/2014   Procedure: MVenia MinksDILATION;  Surgeon: RDaneil Dolin MD;  [facility];  Service: Endoscopy;  Laterality: N/A;  . NASAL SEPTOPLASTY W/ TURBINOPLASTY Bilateral 10/07/2015   Procedure: NASAL SEPTOPLASTY WITH TURBINATE  REDUCTION;  Surgeon: Leta Baptist, MD;  Location: [facility];  Service: ENT;  Laterality: Bilateral;  . NASAL SEPTUM SURGERY Bilateral 10/07/2015    inferior turbinate resection  . NM MYOCAR PERF WALL MOTION  12/2006   dipyridamole myoview - stress images show mild perfusion defect in mid inferior walls with reversibility at rest; mild perfusion defect in mid inferolateral wall at stress with mild defect reversibility, EF 62%, abnormal but low risk study   . POSTERIOR LUMBAR FUSION  2015  . SAVORY DILATION N/A 05/28/2014   Procedure: SAVORY DILATION;  Surgeon: Daneil Dolin, MD;  Location: [facility];  Service: Endoscopy;  Laterality: N/A;  . SHOULDER OPEN ROTATOR CUFF REPAIR Left early 2000s  . small bowel capsule study  07/2011   ?transient focal ischemia in distal small bowel to explain GI bleeding  . TRANSTHORACIC ECHOCARDIOGRAM  09/2008   EF 60-65%, mod conc LVH  . TRANSURETHRAL RESECTION OF PROSTATE    . TRANSURETHRAL RESECTION OF PROSTATE N/A 06/18/2013   Procedure: TRANSURETHRAL RESECTION OF THE PROSTATE (TURP);  Surgeon: Marissa Nestle, MD;  Location: [facility];  Service: Urology;  Laterality: N/A;    Medications: I have reviewed the patient's current medications. Allergies:  Allergies  Allergen Reactions  . Aspirin Other (See Comments)    Nausea and upset stomach    . Nexium [Esomeprazole Magnesium] Other (See Comments)    Patient said it messed his stomach up    Family History  Problem Relation Age of Onset  . Colon cancer Father 61       deceased  . Prostate cancer Father   . Pancreatic cancer Mother 64       deceased  . Prostate cancer Brother   . Arthritis Son   . Arthritis Son   . Diabetes Mellitus II Son    Social History:  reports that he quit smoking about 43 years ago. His smoking use included cigars. He started smoking about 57 years ago. He quit after 14.00 years of use. he has never used smokeless tobacco. He reports that he does not drink alcohol or use drugs.  Review of Systems  Constitutional: Positive for malaise/fatigue.  Gastrointestinal: Positive for abdominal pain.  Genitourinary: Positive for dysuria, frequency and urgency.  All other systems reviewed and are negative.   Physical Exam:  Vital signs in last 24 hours: Temp:  [97.7 F (36.5 C)-97.8 F (36.6 C)] 97.8 F (36.6 C) (01/10 0529) Pulse Rate:  [81-87] 81 (01/10 0529) Resp:  [18-20] 20 (01/10 0529) BP: (118-140)/(51-72) 118/51 (01/10 0529) SpO2:  [94 %-98 %] 94 % (01/10 0529) Physical Exam  Constitutional: He is oriented to person, place,  and time. He appears well-developed and well-nourished.  HENT:  Head: Normocephalic and atraumatic.  Eyes: EOM are normal. Pupils are equal, round, and reactive to light.  Neck: Normal range of motion. No thyromegaly present.  Cardiovascular: Normal rate and regular rhythm.  Respiratory: Effort normal. No respiratory distress.  GI: Soft. He exhibits no distension.  Genitourinary: Rectum normal, testes normal and penis normal. Prostate is enlarged and tender. No penile tenderness.  Musculoskeletal: Normal range of motion. He exhibits no edema.  Neurological: He is alert and oriented to person, place, and time.  Skin: Skin is warm and dry.  Psychiatric: He has a normal mood and affect. His behavior is normal. Judgment and thought content normal.    Laboratory Data:  Results for orders placed or performed during the hospital encounter of 05/27/17 (from  the past 72 hour(s))  Comprehensive metabolic panel     Status: Abnormal   Collection Time: 05/30/17  5:10 AM  Result Value Ref Range   Sodium 135 135 - 145 mmol/L   Potassium 4.3 3.5 - 5.1 mmol/L   Chloride 100 (L) 101 - 111 mmol/L   CO2 27 22 - 32 mmol/L   Glucose, Bld 114 (H) 65 - 99 mg/dL   BUN 11 6 - 20 mg/dL   Creatinine, Ser 1.13 0.61 - 1.24 mg/dL   Calcium 9.0 8.9 - 10.3 mg/dL   Total Protein 5.7 (L) 6.5 - 8.1 g/dL   Albumin 2.9 (L) 3.5 - 5.0 g/dL   AST 11 (L) 15 - 41 U/L   ALT 9 (L) 17 - 63 U/L   Alkaline Phosphatase 50 38 - 126 U/L   Total Bilirubin 0.6 0.3 - 1.2 mg/dL   GFR calc non Af Amer >60 >60 mL/min   GFR calc Af Amer >60 >60 mL/min    Comment: (NOTE) The eGFR has been calculated using the CKD EPI equation. This calculation has not been validated in all clinical situations. eGFR's persistently <60 mL/min signify possible Chronic Kidney Disease.    Anion gap 8 5 - 15  Magnesium     Status: None   Collection Time: 05/30/17  5:10 AM  Result Value Ref Range   Magnesium 2.0 1.7 - 2.4 mg/dL  CBC WITH DIFFERENTIAL      Status: Abnormal   Collection Time: 05/30/17  5:10 AM  Result Value Ref Range   WBC 13.2 (H) 4.0 - 10.5 K/uL   RBC 5.27 4.22 - 5.81 MIL/uL   Hemoglobin 13.8 13.0 - 17.0 g/dL   HCT 42.6 39.0 - 52.0 %   MCV 80.8 78.0 - 100.0 fL   MCH 26.2 26.0 - 34.0 pg   MCHC 32.4 30.0 - 36.0 g/dL   RDW 16.4 (H) 11.5 - 15.5 %   Platelets 111 (L) 150 - 400 K/uL    Comment: SPECIMEN CHECKED FOR CLOTS CONSISTENT WITH PREVIOUS RESULT    Neutrophils Relative % 81 %   Neutro Abs 10.7 (H) 1.7 - 7.7 K/uL   Lymphocytes Relative 14 %   Lymphs Abs 1.8 0.7 - 4.0 K/uL   Monocytes Relative 4 %   Monocytes Absolute 0.5 0.1 - 1.0 K/uL   Eosinophils Relative 1 %   Eosinophils Absolute 0.1 0.0 - 0.7 K/uL   Basophils Relative 0 %   Basophils Absolute 0.0 0.0 - 0.1 K/uL  CBC with Differential/Platelet     Status: Abnormal   Collection Time: 05/31/17  4:32 AM  Result Value Ref Range   WBC 15.6 (H) 4.0 - 10.5 K/uL    Comment: CORRECTED ON 01/09 AT 0641: PREVIOUSLY REPORTED AS 15.9   RBC 5.42 4.22 - 5.81 MIL/uL    Comment: CORRECTED ON 01/09 AT 0641: PREVIOUSLY REPORTED AS 5.45   Hemoglobin 14.0 13.0 - 17.0 g/dL    Comment: CORRECTED ON 01/09 AT 0641: PREVIOUSLY REPORTED AS 13.9   HCT 43.6 39.0 - 52.0 %    Comment: CORRECTED ON 01/09 AT 0641: PREVIOUSLY REPORTED AS 44.3   MCV 80.4 78.0 - 100.0 fL    Comment: CORRECTED ON 01/09 AT 0641: PREVIOUSLY REPORTED AS 81.3   MCH 25.8 (L) 26.0 - 34.0 pg    Comment: CORRECTED ON 01/09 AT 0641: PREVIOUSLY REPORTED AS 25.5   MCHC 32.1 30.0 - 36.0 g/dL    Comment: CORRECTED ON 01/09 AT 0641: PREVIOUSLY REPORTED  AS 31.4   RDW 16.5 (H) 11.5 - 15.5 %   Platelets 113 (L) 150 - 400 K/uL    Comment: SPECIMEN CHECKED FOR CLOTS PLATELET COUNT CONFIRMED BY SMEAR    Neutrophils Relative % 83 %    Comment: CORRECTED ON 01/09 AT 0641: PREVIOUSLY REPORTED AS 84   Neutro Abs 13.1 (H) 1.7 - 7.7 K/uL    Comment: CORRECTED ON 01/09 AT 0641: PREVIOUSLY REPORTED AS 13.4   Lymphocytes  Relative 12 %   Lymphs Abs 1.9 0.7 - 4.0 K/uL   Monocytes Relative 4 %    Comment: CORRECTED ON 01/09 AT 0641: PREVIOUSLY REPORTED AS 3   Monocytes Absolute 0.6 0.1 - 1.0 K/uL    Comment: CORRECTED ON 01/09 AT 0641: PREVIOUSLY REPORTED AS 0.5   Eosinophils Relative 1 %   Eosinophils Absolute 0.1 0.0 - 0.7 K/uL   Basophils Relative 0 %   Basophils Absolute 0.1 0.0 - 0.1 K/uL  Comprehensive metabolic panel     Status: Abnormal   Collection Time: 05/31/17  4:32 AM  Result Value Ref Range   Sodium 138 135 - 145 mmol/L   Potassium 4.1 3.5 - 5.1 mmol/L   Chloride 102 101 - 111 mmol/L   CO2 29 22 - 32 mmol/L   Glucose, Bld 114 (H) 65 - 99 mg/dL   BUN 13 6 - 20 mg/dL   Creatinine, Ser 1.12 0.61 - 1.24 mg/dL   Calcium 9.3 8.9 - 10.3 mg/dL   Total Protein 5.8 (L) 6.5 - 8.1 g/dL   Albumin 3.0 (L) 3.5 - 5.0 g/dL   AST 14 (L) 15 - 41 U/L   ALT 10 (L) 17 - 63 U/L   Alkaline Phosphatase 57 38 - 126 U/L   Total Bilirubin 0.8 0.3 - 1.2 mg/dL   GFR calc non Af Amer >60 >60 mL/min   GFR calc Af Amer >60 >60 mL/min    Comment: (NOTE) The eGFR has been calculated using the CKD EPI equation. This calculation has not been validated in all clinical situations. eGFR's persistently <60 mL/min signify possible Chronic Kidney Disease.    Anion gap 7 5 - 15  Magnesium     Status: None   Collection Time: 05/31/17  4:32 AM  Result Value Ref Range   Magnesium 2.1 1.7 - 2.4 mg/dL   Recent Results (from the past 240 hour(s))  Urine culture     Status: Abnormal   Collection Time: 05/27/17  9:35 AM  Result Value Ref Range Status   Specimen Description URINE, CLEAN CATCH  Final   Special Requests NONE  Final   Culture >=100,000 COLONIES/mL PROTEUS MIRABILIS (A)  Final   Report Status 05/30/2017 FINAL  Final   Organism ID, Bacteria PROTEUS MIRABILIS (A)  Final      Susceptibility   Proteus mirabilis - MIC*    AMPICILLIN <=2 SENSITIVE Sensitive     CEFAZOLIN <=4 SENSITIVE Sensitive     CEFTRIAXONE <=1  SENSITIVE Sensitive     CIPROFLOXACIN <=0.25 SENSITIVE Sensitive     GENTAMICIN <=1 SENSITIVE Sensitive     IMIPENEM 4 SENSITIVE Sensitive     NITROFURANTOIN 128 RESISTANT Resistant     TRIMETH/SULFA <=20 SENSITIVE Sensitive     AMPICILLIN/SULBACTAM <=2 SENSITIVE Sensitive     PIP/TAZO <=4 SENSITIVE Sensitive     * >=100,000 COLONIES/mL PROTEUS MIRABILIS   Creatinine: Recent Labs    05/27/17 0832 05/28/17 0618 05/29/17 0531 05/30/17 0510 05/31/17 0432  CREATININE 0.95 0.96 0.93  1.13 1.12   Baseline Creatinine: 0.95  Impression/Assessment:  76yo with UTI and acute prostatitis  Plan:  1. I discussed the treatment of prostatitis with the patient and given his complicated BPH history I recommend 28 days of culture specific anf prostate specific antibiotics. Due to increased risk of complications related to fluoroquinolones in the geriatric population he should be placed on bactrim DS BID for 28 days. Please continue the flomax 0.'4mg'$  daily. He has an appointment to see me int he office int he next month to discuss management of his [place] 06/01/2017, 7:07 AM

## 2017-06-02 ENCOUNTER — Encounter (HOSPITAL_COMMUNITY): Payer: Self-pay | Admitting: Emergency Medicine

## 2017-06-02 ENCOUNTER — Emergency Department (HOSPITAL_COMMUNITY): Payer: Medicare HMO

## 2017-06-02 ENCOUNTER — Other Ambulatory Visit: Payer: Self-pay

## 2017-06-02 ENCOUNTER — Emergency Department (HOSPITAL_COMMUNITY)
Admission: EM | Admit: 2017-06-02 | Discharge: 2017-06-02 | Disposition: A | Discharge status: Home / Self Care | Payer: Medicare HMO | Attending: Emergency Medicine | Admitting: Emergency Medicine

## 2017-06-02 DIAGNOSIS — R197 Diarrhea, unspecified: Secondary | ICD-10-CM | POA: Insufficient documentation

## 2017-06-02 DIAGNOSIS — E039 Hypothyroidism, unspecified: Secondary | ICD-10-CM | POA: Insufficient documentation

## 2017-06-02 DIAGNOSIS — I2510 Atherosclerotic heart disease of native coronary artery without angina pectoris: Secondary | ICD-10-CM | POA: Insufficient documentation

## 2017-06-02 DIAGNOSIS — R112 Nausea with vomiting, unspecified: Secondary | ICD-10-CM | POA: Diagnosis not present

## 2017-06-02 DIAGNOSIS — K5792 Diverticulitis of intestine, part unspecified, without perforation or abscess without bleeding: Secondary | ICD-10-CM | POA: Diagnosis not present

## 2017-06-02 DIAGNOSIS — Z8673 Personal history of transient ischemic attack (TIA), and cerebral infarction without residual deficits: Secondary | ICD-10-CM | POA: Insufficient documentation

## 2017-06-02 DIAGNOSIS — Z79899 Other long term (current) drug therapy: Secondary | ICD-10-CM | POA: Insufficient documentation

## 2017-06-02 DIAGNOSIS — R109 Unspecified abdominal pain: Secondary | ICD-10-CM | POA: Diagnosis present

## 2017-06-02 DIAGNOSIS — Z7901 Long term (current) use of anticoagulants: Secondary | ICD-10-CM | POA: Insufficient documentation

## 2017-06-02 DIAGNOSIS — I5032 Chronic diastolic (congestive) heart failure: Secondary | ICD-10-CM | POA: Diagnosis not present

## 2017-06-02 DIAGNOSIS — Z87891 Personal history of nicotine dependence: Secondary | ICD-10-CM | POA: Diagnosis not present

## 2017-06-02 DIAGNOSIS — R1084 Generalized abdominal pain: Secondary | ICD-10-CM | POA: Diagnosis not present

## 2017-06-02 DIAGNOSIS — I110 Hypertensive heart disease with heart failure: Secondary | ICD-10-CM | POA: Insufficient documentation

## 2017-06-02 LAB — COMPREHENSIVE METABOLIC PANEL
ALT: 10 U/L — ABNORMAL LOW (ref 17–63)
AST: 14 U/L — AB (ref 15–41)
Albumin: 3.2 g/dL — ABNORMAL LOW (ref 3.5–5.0)
Alkaline Phosphatase: 55 U/L (ref 38–126)
Anion gap: 8 (ref 5–15)
BUN: 12 mg/dL (ref 6–20)
CO2: 28 mmol/L (ref 22–32)
CREATININE: 1.1 mg/dL (ref 0.61–1.24)
Calcium: 8.9 mg/dL (ref 8.9–10.3)
Chloride: 102 mmol/L (ref 101–111)
GFR calc Af Amer: >60 mL/min (ref >60)
Glucose, Bld: 94 mg/dL (ref 65–99)
POTASSIUM: 4.2 mmol/L (ref 3.5–5.1)
Sodium: 138 mmol/L (ref 135–145)
Total Bilirubin: 0.8 mg/dL (ref 0.3–1.2)
Total Protein: 5.9 g/dL — ABNORMAL LOW (ref 6.5–8.1)

## 2017-06-02 LAB — CBC WITH DIFFERENTIAL/PLATELET
BASOS ABS: 0.1 10*3/uL (ref 0.0–0.1)
BASOS PCT: 0 %
EOS ABS: 0.1 10*3/uL (ref 0.0–0.7)
EOS PCT: 1 %
HCT: 44.7 % (ref 39.0–52.0)
Hemoglobin: 14.6 g/dL (ref 13.0–17.0)
LYMPHS PCT: 11 %
Lymphs Abs: 1.6 10*3/uL (ref 0.7–4.0)
MCH: 26.2 pg (ref 26.0–34.0)
MCHC: 32.7 g/dL (ref 30.0–36.0)
MCV: 80.1 fL (ref 78.0–100.0)
Monocytes Absolute: 0.2 10*3/uL (ref 0.1–1.0)
Monocytes Relative: 2 %
Neutro Abs: 12.3 10*3/uL (ref 1.7–7.7)
Neutrophils Relative %: 86 %
PLATELETS: 123 10*3/uL — AB (ref 150–400)
RBC: 5.58 MIL/uL (ref 4.22–5.81)
RDW: 16.4 % — AB (ref 11.5–15.5)
WBC: 14.3 10*3/uL — AB (ref 4.0–10.5)

## 2017-06-02 LAB — URINALYSIS, ROUTINE W REFLEX MICROSCOPIC
BILIRUBIN URINE: NEGATIVE
Bacteria, UA: NONE SEEN
Glucose, UA: NEGATIVE mg/dL
HGB URINE DIPSTICK: NEGATIVE
Ketones, ur: NEGATIVE mg/dL
NITRITE: NEGATIVE
Protein, ur: NEGATIVE mg/dL
SPECIFIC GRAVITY, URINE: 1.009 (ref 1.005–1.030)
pH: 7 (ref 5.0–8.0)

## 2017-06-02 MED ORDER — ONDANSETRON HCL 4 MG/2ML IJ SOLN
4.0000 mg | Freq: Once | INTRAMUSCULAR | Status: AC
  Administered 2017-06-02: 4 mg via INTRAVENOUS
  Filled 2017-06-02: qty 2

## 2017-06-02 MED ORDER — SODIUM CHLORIDE 0.9 % IV BOLUS (SEPSIS)
500.0000 mL | Freq: Once | INTRAVENOUS | Status: AC
  Administered 2017-06-02: 500 mL via INTRAVENOUS

## 2017-06-02 MED ORDER — METOCLOPRAMIDE HCL 5 MG/ML IJ SOLN
5.0000 mg | Freq: Once | INTRAMUSCULAR | Status: AC
  Administered 2017-06-02: 5 mg via INTRAVENOUS
  Filled 2017-06-02: qty 2

## 2017-06-02 MED ORDER — METOCLOPRAMIDE HCL 5 MG PO TABS: 5.0000 mg | ORAL_TABLET | Freq: Three times a day (TID) | ORAL | 0 refills | Status: DC | PRN

## 2017-06-02 MED ORDER — FENTANYL CITRATE (PF) 100 MCG/2ML IJ SOLN
25.0000 ug | Freq: Once | INTRAMUSCULAR | Status: AC
  Administered 2017-06-02: 25 ug via INTRAVENOUS
  Filled 2017-06-02: qty 2

## 2017-06-02 NOTE — Discharge Instructions (Signed)
As discussed, it is important he follow-up with your primary care physician.  Return here for concerning changes in your condition.

## 2017-06-02 NOTE — ED Provider Notes (Signed)
[facility] EMERGENCY DEPARTMENT Provider Note   CSN: [id number] Arrival date & time: 06/02/17  1115     History   Chief Complaint Chief Complaint  Patient presents with  . Abdominal Pain    DC'd from hospital yesterday  . Emesis    HPI [name] [name]. is a 77 y.o. male.  HPI Patient presents 1 day after discharge from the facility now with concern for nausea, vomiting, abdominal pain. He notes that he was treated for abdominal infection, and has been taking his medicine since yesterday, though he was unable to do so this morning. Immediately after discharge she was doing generally well, but today, soon after awakening he felt nauseous, had lower abdominal pain, upper abdominal discomfort as well. Subsequently, he developed nausea, has had episodes of vomiting, described as bilious, malodorous material. He had several normal bowel movements today, in contrast to prior episodes of melena. Patient is here with his son who assists with the HPI. The patient himself is unsure of his specific diagnosis, or of the medications he was instructed to take on discharge.  Chart review demonstrates that patient was discharged after diagnosis of diverticulitis, as well as cystitis. 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 Patient was discharged with oral Bactrim after initially receiving Cipro, Flagyl.   Past Medical History:  Diagnosis Date  . Assistance needed for mobility    cane, walker  . Bilateral lower leg cellulitis   . BPH (benign prostatic hyperplasia)   . Cataract    left  . Chronic diastolic HF (heart failure) ([place])   . Chronic low back pain   . Collagen vascular disease ([place])    history of venous insufficency and LE cellulitis  . Complication of anesthesia    2012 due to sleep apnea pt has had difficulty being put under  and waking up from anesthesia  . Deep venous insufficiency   . GERD (gastroesophageal reflux disease)   .  Glaucoma    left  . Headache   . Heel spur    right heel  . History of bladder infections   . History of colonic polyps   . Hyperlipidemia   . Hypertension    Sees Dr. Debara Pickett  . Hypothyroidism    hypothyroid denies no rx  . Iron deficiency 07/07/2016  . Kidney stones    passed them  . Obesity   . Osteoarthritis   . PONV (postoperative nausea and vomiting)   . PUD (peptic ulcer disease)   . Ringing in ears   . S/P colonoscopy 2007, Feb and March 2012   hx of adenomas, left-sided diverticula, On 07/2010 TCS, fresh blood and clot noted coming from TI.  . S/P endoscopy 2007, 2012   2007: nl, May 2012: antral erosions  . SIRS (systemic inflammatory response syndrome) ([place]) 02/27/2012  . Sleep apnea    supposed to see Dr. Luan Pulling to get it started back, hasn't been on in since 2018  . Thrombocytopenia ([place]) 12/30/2011   Stable    Patient Active Problem List   Diagnosis Date Noted  . Gastrointestinal hemorrhage with melena 05/27/2017  . Acute cystitis 05/27/2017  . Pelvic mass in male 05/27/2017  . Acute diverticulitis 05/27/2017  . Heme positive stool 05/27/2017  . Prostatitis 05/27/2017  . S/P colonoscopy 05/27/2017  . Bilateral primary osteoarthritis of knee 04/06/2017  . Weakness 02/27/2017  . Ventral hernia 02/27/2017  . Acute CVA (cerebrovascular accident) ([place]) 09/13/2016  .  Stroke (cerebrum) ([place]) 09/13/2016  . Syncope 09/12/2016  . Iron deficiency 07/07/2016  . Back pain 03/22/2016  . Acute right-sided low back pain with right-sided sciatica   . S/P nasal septoplasty 10/07/2015  . Parotitis, acute 03/06/2015  . Nausea without vomiting 12/08/2014  . Dysphagia, pharyngoesophageal phase   . History of colonic polyps   . Hypokalemia 09/01/2013  . Dehydration 09/01/2013  . Leukocytosis 09/01/2013  . Diastolic dysfunction by echo Dec 2014 07/30/2013  . Morbid obesity- BMI 44.5 07/30/2013  . Edema of both legs 07/30/2013  . Bilateral lower leg cellulitis 07/18/2013    . 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 OSA (obstructive sleep apnea) 11/27/2012  . Other spondylosis with radiculopathy, lumbar region 08/22/2012  . Bright red blood per rectum 06/15/2012  . Constipation 02/29/2012  . SIRS (systemic inflammatory response syndrome) ([place]) 02/27/2012  . Hyperlipidemia 02/27/2012  . Thrombocytopenia ([place]) 12/30/2011  . Hypothyroidism 10/27/2011  . Upper abdominal pain 10/27/2011  . Rectal bleeding 11/16/2010  . DIARRHEA 06/08/2010  . COLONIC POLYPS, ADENOMATOUS, HX OF 07/16/2009  . Essential hypertension 06/19/2009  . GASTROESOPHAGEAL REFLUX DISEASE, CHRONIC 06/19/2009  . FATTY LIVER DISEASE 06/19/2009  . CHOLELITHIASIS, SYMPTOMATIC 06/19/2009  . ABDOMINAL BLOATING 06/19/2009    Past Surgical History:  Procedure Laterality Date  . BACK SURGERY    . CARDIAC CATHETERIZATION  02/27/2007   no sign of CAD, LVH, mod pulm HTN (Dr. Jackie Plum(  . CATARACT EXTRACTION W/ INTRAOCULAR LENS IMPLANT Right ~ 2012  . COLONOSCOPY N/A 05/28/2014   JHE:RDEYCXKG colonic polyps/colonic diverticulosis. Tubular adenomas. Next colonoscopy January 2021  . ESOPHAGOGASTRODUODENOSCOPY  Aug 2013   [facility], single 82mm pedunculated polyp otherwise normal. HYPERPLASTIC, NEGATIVE h.pylori  . ESOPHAGOGASTRODUODENOSCOPY N/A 05/28/2014   RMR:non-critical Schatzki's ring/HH. Benign gastritis  . INGUINAL HERNIA REPAIR Right 1941  . KNEE ARTHROSCOPY Left ~ 2000  . LAPAROSCOPIC CHOLECYSTECTOMY    . [place] SURGERY  2012  . MALONEY DILATION N/A 05/28/2014   Procedure: Venia Minks DILATION;  Surgeon: Daneil Dolin, MD;  Location: [facility];  Service: Endoscopy;  Laterality: N/A;  . NASAL SEPTOPLASTY W/ TURBINOPLASTY Bilateral 10/07/2015   Procedure: NASAL SEPTOPLASTY WITH TURBINATE REDUCTION;  Surgeon: Leta Baptist, MD;  Location: [place];  Service: ENT;  Laterality: Bilateral;  . NASAL SEPTUM SURGERY Bilateral 10/07/2015    inferior turbinate resection  . NM MYOCAR PERF WALL MOTION  12/2006    dipyridamole myoview - stress images show mild perfusion defect in mid inferior walls with reversibility at rest; mild perfusion defect in mid inferolateral wall at stress with mild defect reversibility, EF 62%, abnormal but low risk study   . POSTERIOR LUMBAR FUSION  2015  . SAVORY DILATION N/A 05/28/2014   Procedure: SAVORY DILATION;  Surgeon: Daneil Dolin, MD;  Location: [facility];  Service: Endoscopy;  Laterality: N/A;  . SHOULDER OPEN ROTATOR CUFF REPAIR Left early 2000s  . small bowel capsule study  07/2011   ?transient focal ischemia in distal small bowel to explain GI bleeding  . TRANSTHORACIC ECHOCARDIOGRAM  09/2008   EF 60-65%, mod conc LVH  . TRANSURETHRAL RESECTION OF PROSTATE    . TRANSURETHRAL RESECTION OF PROSTATE N/A 06/18/2013   Procedure: TRANSURETHRAL RESECTION OF THE PROSTATE (TURP);  Surgeon: Marissa Nestle, MD;  Location: [facility];  Service: Urology;  Laterality: N/A;       Home Medications    Prior to Admission medications   Medication Sig Start Date End Date Taking? Authorizing  Provider  acetaminophen (TYLENOL) 325 MG tablet Take 2 tablets (650 mg total) by mouth every 6 (six) hours as needed for mild pain (or Fever >/= 101). 06/01/17  Yes Debbe Odea, MD  bisacodyl (DULCOLAX) 5 MG EC tablet Take 1 tablet (5 mg total) by mouth daily. 03/26/16  Yes Thurnell Lose, MD  brimonidine (ALPHAGAN) 0.2 % ophthalmic solution Place 1 drop into both eyes 2 (two) times daily.   Yes [provider]  calcium citrate (CALCITRATE - DOSED IN MG ELEMENTAL CALCIUM) 950 MG tablet Take 200 mg of elemental calcium by mouth daily. Takes 3 times per week   Yes [provider]  Cholecalciferol (VITAMIN D3) 1000 units CAPS Take 1 capsule by mouth. Takes 3 times per week   Yes [provider]  clopidogrel (PLAVIX) 75 MG tablet Take 1 tablet (75 mg total) by mouth daily. 11/10/16  Yes Lendon Colonel, NP  diclofenac sodium (VOLTAREN) 1 % GEL Apply 4 g 4  (four) times daily as needed topically. 04/06/17  Yes McKeag, Marylynn Pearson, MD  dorzolamide-timolol (COSOPT) 22.3-6.8 MG/ML ophthalmic solution Place 1 drop into both eyes 2 (two) times daily. 12/17/14  Yes [provider]  famotidine (PEPCID) 20 MG tablet Take 1 tablet (20 mg total) by mouth 2 (two) times daily. 06/01/17  Yes Debbe Odea, MD  ferrous sulfate 325 (65 FE) MG tablet Take 325 mg by mouth daily with breakfast.   Yes [provider]  latanoprost (XALATAN) 0.005 % ophthalmic solution Place 1 drop into both eyes at bedtime.   Yes [provider]  magnesium oxide (MAG-OX) 400 MG tablet Take 400 mg by mouth daily. Takes 2 times per week   Yes [provider]  Menthol, Topical Analgesic, (BIOFREEZE) 4 % GEL To be used by therapy dept as needed   Yes [provider]  methocarbamol (ROBAXIN) 500 MG tablet Take 1 tablet (500 mg total) by mouth 2 (two) times daily. 10/24/16  Yes Francine Graven, DO  Oxycodone HCl 10 MG TABS Take 1 tablet (10 mg total) by mouth 2 (two) times daily as needed (pain). 09/16/16  Yes Reed, Tiffany L, DO  pantoprazole (PROTONIX) 40 MG tablet Take 1 tablet (40 mg total) by mouth daily. 06/02/17  Yes Debbe Odea, MD  Polyethyl Glycol-Propyl Glycol (LUBRICANT EYE DROPS) 0.4-0.3 % SOLN Place 1 drop into both eyes daily as needed (for lubricant eye drops).    Yes [provider]  potassium chloride (K-DUR,KLOR-CON) 10 MEQ tablet Take 40 mEq by mouth 2 (two) times daily.    Yes [provider]  simethicone (MYLICON) 80 MG chewable tablet Chew 1 tablet (80 mg total) by mouth 4 (four) times daily as needed for flatulence. 06/01/17  Yes Debbe Odea, MD  sulfamethoxazole-trimethoprim (BACTRIM DS,SEPTRA DS) 800-160 MG tablet Take 1 tablet by mouth every 12 (twelve) hours. 06/01/17  Yes Debbe Odea, MD  tamsulosin (FLOMAX) 0.4 MG CAPS capsule Take 0.4 mg by mouth daily after supper.   Yes [provider]  traMADol  (ULTRAM) 50 MG tablet Take 1 tablet (50 mg total) every 12 (twelve) hours as needed by mouth. 04/06/17  Yes McKeag, Marylynn Pearson, MD  metoCLOPramide (REGLAN) 5 MG tablet Take 1 tablet (5 mg total) by mouth every 8 (eight) hours as needed for nausea. 06/02/17   Carmin Muskrat, MD    Family History Family History  Problem Relation Age of Onset  . Colon cancer Father 56       deceased  .  Prostate cancer Father   . Pancreatic cancer Mother 14       deceased  . Prostate cancer Brother   . Arthritis Son   . Arthritis Son   . Diabetes Mellitus II Son     Social History Social History   Tobacco Use  . Smoking status: Former Smoker    Years: 14.00    Types: Cigars    Start date: 11/20/1959    Last attempt to quit: 08/10/1973    Years since quitting: 43.8  . Smokeless tobacco: Never Used  Substance Use Topics  . Alcohol use: No    Alcohol/week: 0.0 oz    Comment: denies  . Drug use: No     Allergies   Aspirin and Nexium [esomeprazole magnesium]   Review of Systems Review of Systems  Constitutional:       Per HPI, otherwise negative  HENT:       Per HPI, otherwise negative  Respiratory:       Per HPI, otherwise negative  Cardiovascular:       Per HPI, otherwise negative  Gastrointestinal: Positive for abdominal pain, nausea and vomiting.  Endocrine:       Negative aside from HPI  Genitourinary:       Neg aside from HPI   Musculoskeletal:       Per HPI, otherwise negative  Skin: Negative.   Neurological: Negative for syncope.     Physical Exam Updated Vital Signs BP 139/75   Pulse 86   Temp 98.6 F (37 C) (Oral)   Resp 18   Ht 5\' 6"  (1.676 m)   Wt 120.2 kg (265 lb)   SpO2 96%   BMI 42.77 kg/m   Physical Exam  Constitutional: He is oriented to person, place, and time. He appears well-developed. No distress.  HENT:  Head: Normocephalic and atraumatic.  Eyes: Conjunctivae and EOM are normal.  Cardiovascular: Normal rate and regular rhythm.  Pulmonary/Chest:  Effort normal. No stridor. No respiratory distress.  Abdominal: He exhibits no distension. There is generalized tenderness. There is guarding. There is no rigidity.  Musculoskeletal: He exhibits no edema.  Neurological: He is alert and oriented to person, place, and time.  Skin: Skin is warm and dry.     Psychiatric: He has a normal mood and affect.  Nursing note and vitals reviewed.    ED Treatments / Results  Labs (all labs ordered are listed, but only abnormal results are displayed) Labs Reviewed  COMPREHENSIVE METABOLIC PANEL - Abnormal; Notable for the following components:      Result Value   Total Protein 5.9 (*)    Albumin 3.2 (*)    AST 14 (*)    ALT 10 (*)    All other components within normal limits  CBC WITH DIFFERENTIAL/PLATELET - Abnormal; Notable for the following components:   WBC 14.3 (*)    RDW 16.4 (*)    Platelets 123 (*)    All other components within normal limits  URINALYSIS, ROUTINE W REFLEX MICROSCOPIC - Abnormal; Notable for the following components:   Leukocytes, UA SMALL (*)    Squamous Epithelial / LPF 0-5 (*)    All other components within normal limits    EKG  EKG Interpretation None       Radiology Ct Abdomen Pelvis Wo Contrast  Result Date: 06/02/2017 CLINICAL DATA:  Recent episode of diverticulitis. Recurrent abdominal pain despite treatment. EXAM: CT ABDOMEN AND PELVIS WITHOUT CONTRAST TECHNIQUE: Multidetector CT imaging of the abdomen and  pelvis was performed following the standard protocol without IV contrast. COMPARISON:  CT 05/27/2017.  MRI 05/30/2017. FINDINGS: Lower chest: Mild dependent atelectasis. No pleural fluid. Abnormal left hemidiaphragm with multiple areas of thinning and bulging. Hepatobiliary: Previous cholecystectomy.  No focal liver finding. Pancreas: Normal Spleen: Mild splenomegaly as seen previously. Adrenals/Urinary Tract: No adrenal lesion. Left kidney is normal except for a few tiny cysts. Right kidney shows  incomplete rotation and some small cysts. No evidence hydronephrosis or stone disease. Bladder appears normal. Stomach/Bowel: Less edema in the region of the cecum when compared to the study 6 days ago. Consistent with resolving inflammatory change. No worsening or progressive findings. Appendix is normal. No other significant bowel finding. Vascular/Lymphatic: Aortic atherosclerosis. No aneurysm. IVC is normal. No retroperitoneal adenopathy. Reproductive: Prominent prostate. Other: No free fluid or air. Presacral soft tissue density area appears the same, evaluated at MRI 3 days ago. See report of that study. Not acutely relevant. Musculoskeletal: Extensive chronic degenerative changes and postoperative changes of the spine. IMPRESSION: No worsening or progressive changes when compared to the study of 6 days ago. Suspicion of diverticulitis or other low level inflammatory process in the region of the cecum, which is improving by CT. No new finding in that region. Previously, colonoscopy was suggested and I cannot disagree with that recommendation. Other chronic findings as above stable since the previous exam. Electronically Signed   By: Nelson Chimes M.D.   On: 06/02/2017 13:24     Procedures Procedures (including critical care time)  Medications Ordered in ED Medications  sodium chloride 0.9 % bolus 500 mL (0 mLs Intravenous Stopped 06/02/17 1529)  ondansetron (ZOFRAN) injection 4 mg (4 mg Intravenous Given 06/02/17 1256)  sodium chloride 0.9 % bolus 500 mL (500 mLs Intravenous New Bag/Given 06/02/17 1525)  fentaNYL (SUBLIMAZE) injection 25 mcg (25 mcg Intravenous Given 06/02/17 1526)  metoCLOPramide (REGLAN) injection 5 mg (5 mg Intravenous Given 06/02/17 1526)     Initial Impression / Assessment and Plan / ED Course  I have reviewed the triage vital signs and the nursing notes.  Pertinent labs & imaging results that were available during my care of the patient were reviewed by me and considered in  my medical decision making (see chart for details).  Initial evaluation and chart review the patient received fluids, antiemetic. With concern for recurrent infection and/or abscess formation, the patient will repeat CT scan performed.  5:01 PM After fluids, Reglan, analgesia the patient has been tolerant of oral intake. He took several different anti-emetics to stop the patient's nausea. Now, he states that he is feeling better, family corroborates that he is looking better. We had a lengthy conversation about all findings including generally reassuring CT, and labs, though there is evidence for persistent inflammation/infection, requiring close outpatient follow-up. With no evidence for new perforation, and with resolution of his symptoms, patient is appropriate to continue outpatient oral therapy, with primary care and GI follow-up.  Final Clinical Impressions(s) / ED Diagnoses   Final diagnoses:  Nausea vomiting and diarrhea    ED Discharge Orders        Ordered    metoCLOPramide (REGLAN) 5 MG tablet  Every 8 hours PRN     06/02/17 1700       Carmin Muskrat, MD 06/02/17 1701

## 2017-06-02 NOTE — ED Notes (Signed)
ED Provider at bedside. 

## 2017-06-02 NOTE — ED Triage Notes (Signed)
Dc'd from hospital yesterday  Returns for same complaints today  abd pain  N/V  Prostate

## 2017-06-06 DIAGNOSIS — N410 Acute prostatitis: Secondary | ICD-10-CM | POA: Diagnosis not present

## 2017-06-06 DIAGNOSIS — I110 Hypertensive heart disease with heart failure: Secondary | ICD-10-CM | POA: Diagnosis not present

## 2017-06-06 DIAGNOSIS — N401 Enlarged prostate with lower urinary tract symptoms: Secondary | ICD-10-CM | POA: Diagnosis not present

## 2017-06-06 DIAGNOSIS — I5032 Chronic diastolic (congestive) heart failure: Secondary | ICD-10-CM | POA: Diagnosis not present

## 2017-06-06 DIAGNOSIS — K5780 Diverticulitis of intestine, part unspecified, with perforation and abscess without bleeding: Secondary | ICD-10-CM | POA: Diagnosis not present

## 2017-06-06 DIAGNOSIS — R351 Nocturia: Secondary | ICD-10-CM | POA: Diagnosis not present

## 2017-06-06 DIAGNOSIS — G8929 Other chronic pain: Secondary | ICD-10-CM | POA: Diagnosis not present

## 2017-06-06 DIAGNOSIS — K635 Polyp of colon: Secondary | ICD-10-CM | POA: Diagnosis not present

## 2017-06-06 DIAGNOSIS — N3000 Acute cystitis without hematuria: Secondary | ICD-10-CM | POA: Diagnosis not present

## 2017-06-06 DIAGNOSIS — M545 Low back pain: Secondary | ICD-10-CM | POA: Diagnosis not present

## 2017-06-08 DIAGNOSIS — I110 Hypertensive heart disease with heart failure: Secondary | ICD-10-CM | POA: Diagnosis not present

## 2017-06-08 DIAGNOSIS — N3000 Acute cystitis without hematuria: Secondary | ICD-10-CM | POA: Diagnosis not present

## 2017-06-08 DIAGNOSIS — K5780 Diverticulitis of intestine, part unspecified, with perforation and abscess without bleeding: Secondary | ICD-10-CM | POA: Diagnosis not present

## 2017-06-08 DIAGNOSIS — I5032 Chronic diastolic (congestive) heart failure: Secondary | ICD-10-CM | POA: Diagnosis not present

## 2017-06-08 DIAGNOSIS — G8929 Other chronic pain: Secondary | ICD-10-CM | POA: Diagnosis not present

## 2017-06-08 DIAGNOSIS — M545 Low back pain: Secondary | ICD-10-CM | POA: Diagnosis not present

## 2017-06-08 DIAGNOSIS — R351 Nocturia: Secondary | ICD-10-CM | POA: Diagnosis not present

## 2017-06-08 DIAGNOSIS — N401 Enlarged prostate with lower urinary tract symptoms: Secondary | ICD-10-CM | POA: Diagnosis not present

## 2017-06-08 DIAGNOSIS — N410 Acute prostatitis: Secondary | ICD-10-CM | POA: Diagnosis not present

## 2017-06-08 DIAGNOSIS — K635 Polyp of colon: Secondary | ICD-10-CM | POA: Diagnosis not present

## 2017-06-13 DIAGNOSIS — N401 Enlarged prostate with lower urinary tract symptoms: Secondary | ICD-10-CM | POA: Diagnosis not present

## 2017-06-13 DIAGNOSIS — N410 Acute prostatitis: Secondary | ICD-10-CM | POA: Diagnosis not present

## 2017-06-13 DIAGNOSIS — R351 Nocturia: Secondary | ICD-10-CM | POA: Diagnosis not present

## 2017-06-13 DIAGNOSIS — M545 Low back pain: Secondary | ICD-10-CM | POA: Diagnosis not present

## 2017-06-13 DIAGNOSIS — K635 Polyp of colon: Secondary | ICD-10-CM | POA: Diagnosis not present

## 2017-06-13 DIAGNOSIS — K5780 Diverticulitis of intestine, part unspecified, with perforation and abscess without bleeding: Secondary | ICD-10-CM | POA: Diagnosis not present

## 2017-06-13 DIAGNOSIS — I110 Hypertensive heart disease with heart failure: Secondary | ICD-10-CM | POA: Diagnosis not present

## 2017-06-13 DIAGNOSIS — I5032 Chronic diastolic (congestive) heart failure: Secondary | ICD-10-CM | POA: Diagnosis not present

## 2017-06-13 DIAGNOSIS — N3000 Acute cystitis without hematuria: Secondary | ICD-10-CM | POA: Diagnosis not present

## 2017-06-13 DIAGNOSIS — G8929 Other chronic pain: Secondary | ICD-10-CM | POA: Diagnosis not present

## 2017-06-15 DIAGNOSIS — R1900 Intra-abdominal and pelvic swelling, mass and lump, unspecified site: Secondary | ICD-10-CM | POA: Diagnosis not present

## 2017-06-15 DIAGNOSIS — Z6841 Body Mass Index (BMI) 40.0 and over, adult: Secondary | ICD-10-CM | POA: Diagnosis not present

## 2017-06-15 DIAGNOSIS — I10 Essential (primary) hypertension: Secondary | ICD-10-CM | POA: Diagnosis not present

## 2017-06-15 DIAGNOSIS — Z1389 Encounter for screening for other disorder: Secondary | ICD-10-CM | POA: Diagnosis not present

## 2017-06-15 DIAGNOSIS — K5792 Diverticulitis of intestine, part unspecified, without perforation or abscess without bleeding: Secondary | ICD-10-CM | POA: Diagnosis not present

## 2017-06-15 DIAGNOSIS — K922 Gastrointestinal hemorrhage, unspecified: Secondary | ICD-10-CM | POA: Diagnosis not present

## 2017-06-15 DIAGNOSIS — N419 Inflammatory disease of prostate, unspecified: Secondary | ICD-10-CM | POA: Diagnosis not present

## 2017-06-26 DIAGNOSIS — Z79899 Other long term (current) drug therapy: Secondary | ICD-10-CM | POA: Diagnosis not present

## 2017-06-26 DIAGNOSIS — E559 Vitamin D deficiency, unspecified: Secondary | ICD-10-CM | POA: Diagnosis not present

## 2017-06-26 DIAGNOSIS — N183 Chronic kidney disease, stage 3 (moderate): Secondary | ICD-10-CM | POA: Diagnosis not present

## 2017-06-26 DIAGNOSIS — R809 Proteinuria, unspecified: Secondary | ICD-10-CM | POA: Diagnosis not present

## 2017-06-26 DIAGNOSIS — D509 Iron deficiency anemia, unspecified: Secondary | ICD-10-CM | POA: Diagnosis not present

## 2017-06-26 DIAGNOSIS — I10 Essential (primary) hypertension: Secondary | ICD-10-CM | POA: Diagnosis not present

## 2017-06-27 DIAGNOSIS — I739 Peripheral vascular disease, unspecified: Secondary | ICD-10-CM | POA: Diagnosis not present

## 2017-06-28 ENCOUNTER — Ambulatory Visit: Payer: Medicare HMO | Admitting: Nurse Practitioner

## 2017-06-28 ENCOUNTER — Encounter: Payer: Self-pay | Admitting: Nurse Practitioner

## 2017-06-28 ENCOUNTER — Encounter: Payer: Self-pay | Admitting: Internal Medicine

## 2017-06-28 VITALS — BP 172/94 | HR 86 | Temp 97.2°F | Ht 68.0 in | Wt 276.2 lb

## 2017-06-28 DIAGNOSIS — K921 Melena: Secondary | ICD-10-CM

## 2017-06-28 DIAGNOSIS — R110 Nausea: Secondary | ICD-10-CM | POA: Diagnosis not present

## 2017-06-28 DIAGNOSIS — K5732 Diverticulitis of large intestine without perforation or abscess without bleeding: Secondary | ICD-10-CM | POA: Diagnosis not present

## 2017-06-28 DIAGNOSIS — K219 Gastro-esophageal reflux disease without esophagitis: Secondary | ICD-10-CM | POA: Diagnosis not present

## 2017-06-28 NOTE — Progress Notes (Signed)
Referring Provider: Redmond School, MD Primary Care Physician:  Redmond School, MD Primary GI:  Dr. Gala Romney  Chief Complaint  Patient presents with  . Nausea    no vomiting  . Abdominal Pain    upper area    HPI:   [name] [name]. is a 77 y.o. male who presents for evaluation of nausea and vomiting.  The patient was last seen in our office 07/21/2016 for upper abdominal pain, nausea, vomiting.  He remotely had an issue where his symptoms became somewhat worse prior to his last visit due to his pharmacy changing his medications over into a pill pack style with unlabeled pills.  He felt taking lots of medications at once were causing a problem.  We are able to successfully have his insurance company change his medications over to a different style.  At his last visit he noted morning time nausea which improved gingerroot.  Zofran made him worse.  Still with epigastric pain which was dull, intermittent, worse with nausea.  No overt dyspepsia symptoms otherwise.  No constipation or diarrhea, no other GI symptoms.  Overall he felt better than previously.  Recommended changing Protonix to Dexilant and requested a progress report.  After some difficulty with prior authorization it appears we were able to get Dexilant approved because he indicated it was working well.  No further communication since.  The patient was recently admitted from 05/27/2017 through 06/01/2017.  He was noted to have a UTI and a tender prostate with urinary retention.  He noted black bowel movements prior to the hospital and was heme positive.  His UTI grew Proteus and was recommended 28 days of Bactrim by urology.  Noted heme positive stools with prior EGD and colonoscopy in 2016 and in the hospital they started him on Pepcid twice daily and Protonix daily.  No ongoing bleeding.  So a question of cecal diverticulitis on CT exam although pain-free.  He was treated with antibiotics.  He return to the emergency room the day  after his discharge for nausea vomiting and abdominal pain.  At that time his bowel movements were noted to be normal and without melena.  He was having lower abdominal pain as well as epigastric pain.  CT was repeated and found to be generally reassuring.  He felt better after antiemetics recommended follow-up with GI and continue oral antibiotics.  Today states he's doing ok overall. Having some nausea, typically worse in the morning. Antiemetic works well, but typically better if he takes it before he eats. Has intermittent epigastric pain, typically occurs with nausea. Typically has nausea 3 times a week; sometimes bad, sometimes not bad. Denies other abdominal pain, hematochezia, melena, unintentional weight loss, fever, chills, acute bowel habit changes. Denies chest pain, dyspnea, dizziness, lightheadedness, syncope, near syncope. Denies any other upper or lower GI symptoms.   TCS up to day 2016, due in 2021 due to polyps. EGD completed 2016 s/p dilation of non-critical Schatzki's ring, hiatal hernia, abnormal gastric mucosa s/p biopsy. Path found bx to be mild chronic inflammation.  His BP is a bit hight today, but states he hasn't taken his BP medication yet this morning.  Past Medical History:  Diagnosis Date  . Assistance needed for mobility    cane, walker  . Bilateral lower leg cellulitis   . BPH (benign prostatic hyperplasia)   . Cataract    left  . Chronic diastolic HF (heart failure) ([place])   . Chronic low back pain   .  Collagen vascular disease ([place])    history of venous insufficency and LE cellulitis  . Complication of anesthesia    2012 due to sleep apnea pt has had difficulty being put under  and waking up from anesthesia  . Deep venous insufficiency   . GERD (gastroesophageal reflux disease)   . Glaucoma    left  . Headache   . Heel spur    right heel  . History of bladder infections   . History of colonic polyps   . Hyperlipidemia   . Hypertension    Sees Dr.  Debara Pickett  . Hypothyroidism    hypothyroid denies no rx  . Iron deficiency 07/07/2016  . Kidney stones    passed them  . Obesity   . Osteoarthritis   . PONV (postoperative nausea and vomiting)   . PUD (peptic ulcer disease)   . Ringing in ears   . S/P colonoscopy 2007, Feb and March 2012   hx of adenomas, left-sided diverticula, On 07/2010 TCS, fresh blood and clot noted coming from TI.  . S/P endoscopy 2007, 2012   2007: nl, May 2012: antral erosions  . SIRS (systemic inflammatory response syndrome) ([place]) 02/27/2012  . Sleep apnea    supposed to see Dr. Luan Pulling to get it started back, hasn't been on in since 2018  . Thrombocytopenia ([place]) 12/30/2011   Stable    Past Surgical History:  Procedure Laterality Date  . BACK SURGERY    . CARDIAC CATHETERIZATION  02/27/2007   no sign of CAD, LVH, mod pulm HTN (Dr. Jackie Plum(  . CATARACT EXTRACTION W/ INTRAOCULAR LENS IMPLANT Right ~ 2012  . COLONOSCOPY N/A 05/28/2014   IWP:[id number] colonic polyps/colonic diverticulosis. Tubular adenomas. Next colonoscopy January 2021  . ESOPHAGOGASTRODUODENOSCOPY  Aug 2013   [facility], single 32mm pedunculated polyp otherwise normal. HYPERPLASTIC, NEGATIVE h.pylori  . ESOPHAGOGASTRODUODENOSCOPY N/A 05/28/2014   RMR:non-critical Schatzki's ring/HH. Benign gastritis  . INGUINAL HERNIA REPAIR Right 1941  . KNEE ARTHROSCOPY Left ~ 2000  . LAPAROSCOPIC CHOLECYSTECTOMY    . [place] SURGERY  2012  . MALONEY DILATION N/A 05/28/2014   Procedure: Venia Minks DILATION;  Surgeon: Daneil Dolin, MD;  [facility];  Service: Endoscopy;  Laterality: N/A;  . NASAL SEPTOPLASTY W/ TURBINOPLASTY Bilateral 10/07/2015   Procedure: NASAL SEPTOPLASTY WITH TURBINATE REDUCTION;  Surgeon: Leta Baptist, MD;  Location: [facility];  Service: ENT;  Laterality: Bilateral;  . NASAL SEPTUM SURGERY Bilateral 10/07/2015    inferior turbinate resection  . NM MYOCAR PERF WALL MOTION  12/2006   dipyridamole myoview - stress images show mild perfusion  defect in mid inferior walls with reversibility at rest; mild perfusion defect in mid inferolateral wall at stress with mild defect reversibility, EF 62%, abnormal but low risk study   . POSTERIOR LUMBAR FUSION  2015  . SAVORY DILATION N/A 05/28/2014   Procedure: SAVORY DILATION;  Surgeon: Daneil Dolin, MD;  Location: [facility];  Service: Endoscopy;  Laterality: N/A;  . SHOULDER OPEN ROTATOR CUFF REPAIR Left early 2000s  . small bowel capsule study  07/2011   ?transient focal ischemia in distal small bowel to explain GI bleeding  . TRANSTHORACIC ECHOCARDIOGRAM  09/2008   EF 60-65%, mod conc LVH  . TRANSURETHRAL RESECTION OF PROSTATE    . TRANSURETHRAL RESECTION OF PROSTATE N/A 06/18/2013   Procedure: TRANSURETHRAL RESECTION OF THE PROSTATE (TURP);  Surgeon: Marissa Nestle, MD;  Location: [facility];  Service: Urology;  Laterality: N/A;  Current Outpatient Medications  Medication Sig Dispense Refill  . acetaminophen (TYLENOL) 325 MG tablet Take 2 tablets (650 mg total) by mouth every 6 (six) hours as needed for mild pain (or Fever >/= 101).    . bisacodyl (DULCOLAX) 5 MG EC tablet Take 1 tablet (5 mg total) by mouth daily. 30 tablet 0  . brimonidine (ALPHAGAN) 0.2 % ophthalmic solution Place 1 drop into both eyes 2 (two) times daily.    . calcium citrate (CALCITRATE - DOSED IN MG ELEMENTAL CALCIUM) 950 MG tablet Take 200 mg of elemental calcium by mouth daily. Takes 3 times per week    . Cholecalciferol (VITAMIN D3) 1000 units CAPS Take 1 capsule by mouth. Takes 3 times per week    . clopidogrel (PLAVIX) 75 MG tablet Take 1 tablet (75 mg total) by mouth daily. 90 tablet 3  . diclofenac sodium (VOLTAREN) 1 % GEL Apply 4 g 4 (four) times daily as needed topically. 2 Tube 3  . dorzolamide-timolol (COSOPT) 22.3-6.8 MG/ML ophthalmic solution Place 1 drop into both eyes 2 (two) times daily.    . famotidine (PEPCID) 20 MG tablet Take 1 tablet (20 mg total) by mouth 2 (two) times daily. 60 tablet 0    . ferrous sulfate 325 (65 FE) MG tablet Take 325 mg by mouth daily with breakfast.    . latanoprost (XALATAN) 0.005 % ophthalmic solution Place 1 drop into both eyes at bedtime.    . magnesium oxide (MAG-OX) 400 MG tablet Take 400 mg by mouth daily. Takes 2 times per week    . Menthol, Topical Analgesic, (BIOFREEZE) 4 % GEL To be used by therapy dept as needed    . methocarbamol (ROBAXIN) 500 MG tablet Take 1 tablet (500 mg total) by mouth 2 (two) times daily. 20 tablet 0  . metoCLOPramide (REGLAN) 5 MG tablet Take 1 tablet (5 mg total) by mouth every 8 (eight) hours as needed for nausea. 30 tablet 0  . Oxycodone HCl 10 MG TABS Take 1 tablet (10 mg total) by mouth 2 (two) times daily as needed (pain). 60 tablet 0  . pantoprazole (PROTONIX) 40 MG tablet Take 1 tablet (40 mg total) by mouth daily. 30 tablet 0  . Polyethyl Glycol-Propyl Glycol (LUBRICANT EYE DROPS) 0.4-0.3 % SOLN Place 1 drop into both eyes daily as needed (for lubricant eye drops).     . potassium chloride (K-DUR,KLOR-CON) 10 MEQ tablet Take 40 mEq by mouth 2 (two) times daily.     . simethicone (MYLICON) 80 MG chewable tablet Chew 1 tablet (80 mg total) by mouth 4 (four) times daily as needed for flatulence. 30 tablet 0  . tamsulosin (FLOMAX) 0.4 MG CAPS capsule Take 0.4 mg by mouth daily after supper.    . traMADol (ULTRAM) 50 MG tablet Take 1 tablet (50 mg total) every 12 (twelve) hours as needed by mouth. 20 tablet 0  . sulfamethoxazole-trimethoprim (BACTRIM DS,SEPTRA DS) 800-160 MG tablet Take 1 tablet by mouth every 12 (twelve) hours. (Patient not taking: Reported on 06/28/2017) 42 tablet 0   No current facility-administered medications for this visit.     Allergies as of 06/28/2017 - Review Complete 06/28/2017  Allergen Reaction Noted  . Aspirin Other (See Comments)   . Nexium [esomeprazole magnesium] Other (See Comments) 11/14/2013    Family History  Problem Relation Age of Onset  . Colon cancer Father 69        deceased  . Prostate cancer Father   . Pancreatic cancer  Mother 17       deceased  . Prostate cancer Brother   . Arthritis Son   . Arthritis Son   . Diabetes Mellitus II Son     Social History   Socioeconomic History  . Marital status: Widowed    Spouse name: Roger Kill  . Number of children: 5  . Years of education: College  . Highest education level: None  Social Needs  . Financial resource strain: None  . Food insecurity - worry: None  . Food insecurity - inability: None  . Transportation needs - medical: None  . Transportation needs - non-medical: None  Occupational History  . Occupation: disability    Employer: RETIRED  Tobacco Use  . Smoking status: Former Smoker    Years: 14.00    Types: Cigars    Start date: 11/20/1959    Last attempt to quit: 08/10/1973    Years since quitting: 43.9  . Smokeless tobacco: Never Used  Substance and Sexual Activity  . Alcohol use: No    Alcohol/week: 0.0 oz    Comment: denies  . Drug use: No  . Sexual activity: No    Partners: Male    Birth control/protection: None  Other Topics Concern  . None  Social History Narrative   Patient lives at home with spouse and son.   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     Review of Systems: General: Negative for anorexia, weight loss, fever, chills, fatigue, weakness. ENT: Negative for hoarseness, difficulty swallowing. CV: Negative for chest pain, angina, palpitations, peripheral edema.  Respiratory: Negative for dyspnea at rest, cough, sputum, wheezing.  GI: See history of present illness. Endo: Negative for unusual weight change.  Heme: Negative for bruising or bleeding.   Physical Exam: BP (!) 172/94   Pulse 86   Temp (!) 97.2 F (36.2 C) (Oral)   Ht 5\' 8"  (1.727 m)   Wt 276 lb 3.2 oz (125.3 kg)   BMI 42.00 kg/m  General:   Alert and oriented. Pleasant and cooperative. Well-nourished and well-developed.  Eyes:  Without icterus, sclera clear  and conjunctiva pink.  Ears:  Normal auditory acuity. Cardiovascular:  S1, S2 present without murmurs appreciated. Extremities without clubbing or edema. Respiratory:  Clear to auscultation bilaterally. No wheezes, rales, or rhonchi. No distress.  Gastrointestinal:  +BS, rounded but soft, non-tender and non-distended. No HSM noted. No guarding or rebound. No masses appreciated.  Rectal:  Deferred  Musculoskalatal:  Symmetrical without gross deformities. Neurologic:  Alert and oriented x4;  grossly normal neurologically. Psych:  Alert and cooperative. Normal mood and affect. Heme/Lymph/Immune: No excessive bruising noted.    06/28/2017 9:06 AM   Disclaimer: This note was dictated with voice recognition software. Similar sounding words can inadvertently be transcribed and may not be corrected upon review.

## 2017-06-28 NOTE — Progress Notes (Signed)
CC'D TO PCP °

## 2017-06-28 NOTE — Assessment & Plan Note (Signed)
The patient was previously admitted 1/5 to 06/01/2017 and had acute diverticulitis.  He was treated with antibiotics and feels much better now.  No further right lower quadrant abdominal pain.  No pain on palpation.  He completed his antibiotics.  Continue to monitor, follow-up in 2 months.

## 2017-06-28 NOTE — Assessment & Plan Note (Signed)
GERD symptoms.  He is generally difficult to control.  Previously was on Dexilant and doing somewhat better.  During his admission his Dexilant was stopped and he was placed on Protonix once daily and Pepcid twice a day.  He feels this combination is working pretty well for him.  At this point, at the sake of potentially worsening his symptoms, I will leave his medications alone for now.  I will provide GERD education to reinforce foods to avoid.  Follow-up in 2 months.  If he still has intermittent symptoms we could plan possible upper endoscopy which is not been completed since 2016.  Call us if any worsening symptoms before then.

## 2017-06-28 NOTE — Assessment & Plan Note (Signed)
He did indicate during his hospitalization that he had previously had melena.  He was heme positive.  He is no longer having any melena stools.  At this point I will check a CBC to make sure he is not anemic.  Continue to monitor, follow-up in 2 months.  At that time we might consider doing an upper endoscopy to further evaluate his persistent GERD symptoms as per below.

## 2017-06-28 NOTE — Patient Instructions (Addendum)
1. Have your blood test completed when you are able to. 2. Continue your current acid blocker medications. 3. Below I am giving you recommendations of foods to avoid to prevent worsening symptoms. 4. Return for follow-up in 2 months. 5. Call us if you have any worsening symptoms before then. 6. Call us if you have any questions or concerns.    Food Choices for Gastroesophageal Reflux Disease, Adult When you have gastroesophageal reflux disease (GERD), the foods you eat and your eating habits are very important. Choosing the right foods can help ease your discomfort. What guidelines do I need to follow?  Choose fruits, vegetables, whole grains, and low-fat dairy products.  Choose low-fat meat, fish, and poultry.  Limit fats such as oils, salad dressings, butter, nuts, and avocado.  Keep a food diary. This helps you identify foods that cause symptoms.  Avoid foods that cause symptoms. These may be different for everyone.  Eat small meals often instead of 3 large meals a day.  Eat your meals slowly, in a place where you are relaxed.  Limit fried foods.  Cook foods using methods other than frying.  Avoid drinking alcohol.  Avoid drinking large amounts of liquids with your meals.  Avoid bending over or lying down until 2-3 hours after eating. What foods are not recommended? These are some foods and drinks that may make your symptoms worse: Vegetables Tomatoes. Tomato juice. Tomato and spaghetti sauce. Chili peppers. Onion and garlic. Horseradish. Fruits Oranges, grapefruit, and lemon (fruit and juice). Meats High-fat meats, fish, and poultry. This includes hot dogs, ribs, ham, sausage, salami, and bacon. Dairy Whole milk and chocolate milk. Sour cream. Cream. Butter. Ice cream. Cream cheese. Drinks Coffee and tea. Bubbly (carbonated) drinks or energy drinks. Condiments Hot sauce. Barbecue sauce. Sweets/Desserts Chocolate and cocoa. Donuts. Peppermint and spearmint. Fats  and Oils High-fat foods. This includes [place] fries and potato chips. Other Vinegar. Strong spices. This includes black pepper, white pepper, red pepper, cayenne, curry powder, cloves, ginger, and chili powder. The items listed above may not be a complete list of foods and drinks to avoid. Contact your dietitian for more information. This information is not intended to replace advice given to you by your health care provider. Make sure you discuss any questions you have with your health care provider. Document Released: 11/08/2011 Document Revised: 10/15/2015 Document Reviewed: 03/13/2013 Elsevier Interactive Patient Education  2017 [employer].

## 2017-06-28 NOTE — Assessment & Plan Note (Signed)
He does have some persistent nausea which typically occurs about 3 times a week, often is not bad.  Antiemetics work well for him.  At this point I will have him continue his antiemetics.  If he continues to have GERD symptoms including nausea and/or pending his CBC results, we may need to repeat upper endoscopy that was last completed in 2016.  Follow-up in 2 months.

## 2017-06-29 DIAGNOSIS — M170 Bilateral primary osteoarthritis of knee: Secondary | ICD-10-CM | POA: Diagnosis not present

## 2017-06-29 DIAGNOSIS — M25561 Pain in right knee: Secondary | ICD-10-CM | POA: Diagnosis not present

## 2017-06-29 DIAGNOSIS — M1711 Unilateral primary osteoarthritis, right knee: Secondary | ICD-10-CM | POA: Diagnosis not present

## 2017-06-29 DIAGNOSIS — M25562 Pain in left knee: Secondary | ICD-10-CM | POA: Diagnosis not present

## 2017-07-03 ENCOUNTER — Telehealth: Payer: Self-pay

## 2017-07-03 NOTE — Telephone Encounter (Signed)
I have received the lab results from labs done by Dr. Lowanda Foster on 06/26/2017. They are in the box for review by Walden Field, NP.

## 2017-07-06 DIAGNOSIS — M25561 Pain in right knee: Secondary | ICD-10-CM | POA: Diagnosis not present

## 2017-07-06 DIAGNOSIS — M1711 Unilateral primary osteoarthritis, right knee: Secondary | ICD-10-CM | POA: Diagnosis not present

## 2017-07-12 ENCOUNTER — Ambulatory Visit: Payer: PPO | Admitting: Podiatry

## 2017-07-13 DIAGNOSIS — M25561 Pain in right knee: Secondary | ICD-10-CM | POA: Diagnosis not present

## 2017-07-13 DIAGNOSIS — M1711 Unilateral primary osteoarthritis, right knee: Secondary | ICD-10-CM | POA: Diagnosis not present

## 2017-07-13 NOTE — Telephone Encounter (Signed)
Labs reviewed.  There were 24 2019.  Creatinine mildly elevated at 1.11.  Normal iron at 53, normal iron saturation at 22%, normal ferritin is 61, normal hemoglobin of 14.4.  Vitamin D is normal at 31.7.  Overall, labs look good.  Marked for scanning.

## 2017-07-17 DIAGNOSIS — M961 Postlaminectomy syndrome, not elsewhere classified: Secondary | ICD-10-CM | POA: Diagnosis not present

## 2017-07-17 DIAGNOSIS — M25551 Pain in right hip: Secondary | ICD-10-CM | POA: Diagnosis not present

## 2017-07-17 DIAGNOSIS — M47816 Spondylosis without myelopathy or radiculopathy, lumbar region: Secondary | ICD-10-CM | POA: Diagnosis not present

## 2017-07-17 DIAGNOSIS — M25552 Pain in left hip: Secondary | ICD-10-CM | POA: Diagnosis not present

## 2017-07-18 DIAGNOSIS — R69 Illness, unspecified: Secondary | ICD-10-CM | POA: Diagnosis not present

## 2017-07-20 DIAGNOSIS — M1711 Unilateral primary osteoarthritis, right knee: Secondary | ICD-10-CM | POA: Diagnosis not present

## 2017-07-20 DIAGNOSIS — M25561 Pain in right knee: Secondary | ICD-10-CM | POA: Diagnosis not present

## 2017-07-26 DIAGNOSIS — M47816 Spondylosis without myelopathy or radiculopathy, lumbar region: Secondary | ICD-10-CM | POA: Diagnosis not present

## 2017-07-26 DIAGNOSIS — Z6841 Body Mass Index (BMI) 40.0 and over, adult: Secondary | ICD-10-CM | POA: Diagnosis not present

## 2017-07-26 DIAGNOSIS — R030 Elevated blood-pressure reading, without diagnosis of hypertension: Secondary | ICD-10-CM | POA: Diagnosis not present

## 2017-08-01 ENCOUNTER — Inpatient Hospital Stay (HOSPITAL_COMMUNITY): Payer: Medicare HMO | Attending: Internal Medicine

## 2017-08-01 ENCOUNTER — Ambulatory Visit: Payer: Medicare HMO | Admitting: Urology

## 2017-08-01 DIAGNOSIS — D696 Thrombocytopenia, unspecified: Secondary | ICD-10-CM | POA: Insufficient documentation

## 2017-08-01 DIAGNOSIS — D72829 Elevated white blood cell count, unspecified: Secondary | ICD-10-CM | POA: Diagnosis not present

## 2017-08-01 DIAGNOSIS — N401 Enlarged prostate with lower urinary tract symptoms: Secondary | ICD-10-CM

## 2017-08-01 DIAGNOSIS — R351 Nocturia: Secondary | ICD-10-CM | POA: Diagnosis not present

## 2017-08-01 DIAGNOSIS — E611 Iron deficiency: Secondary | ICD-10-CM | POA: Diagnosis not present

## 2017-08-01 LAB — IRON AND TIBC
Iron: 50 ug/dL (ref 45–182)
SATURATION RATIOS: 21 % (ref 17.9–39.5)
TIBC: 234 ug/dL — AB (ref 250–450)
UIBC: 184 ug/dL

## 2017-08-01 LAB — CBC WITH DIFFERENTIAL/PLATELET
BASOS PCT: 0 %
Basophils Absolute: 0 10*3/uL (ref 0.0–0.1)
EOS ABS: 0.1 10*3/uL (ref 0.0–0.7)
EOS PCT: 1 %
HCT: 46.1 % (ref 39.0–52.0)
Hemoglobin: 14.8 g/dL (ref 13.0–17.0)
Lymphocytes Relative: 16 %
Lymphs Abs: 1.6 10*3/uL (ref 0.7–4.0)
MCH: 26.1 pg (ref 26.0–34.0)
MCHC: 32.1 g/dL (ref 30.0–36.0)
MCV: 81.4 fL (ref 78.0–100.0)
MONOS PCT: 2 %
Monocytes Absolute: 0.2 10*3/uL (ref 0.1–1.0)
NEUTROS PCT: 81 %
Neutro Abs: 8.4 10*3/uL — ABNORMAL HIGH (ref 1.7–7.7)
PLATELETS: 103 10*3/uL — AB (ref 150–400)
RBC: 5.66 MIL/uL (ref 4.22–5.81)
RDW: 16.8 % — ABNORMAL HIGH (ref 11.5–15.5)
WBC: 10.4 10*3/uL (ref 4.0–10.5)

## 2017-08-01 LAB — COMPREHENSIVE METABOLIC PANEL
ALBUMIN: 3.5 g/dL (ref 3.5–5.0)
ALT: 9 U/L — ABNORMAL LOW (ref 17–63)
ANION GAP: 8 (ref 5–15)
AST: 16 U/L (ref 15–41)
Alkaline Phosphatase: 65 U/L (ref 38–126)
BUN: 14 mg/dL (ref 6–20)
CHLORIDE: 105 mmol/L (ref 101–111)
CO2: 27 mmol/L (ref 22–32)
Calcium: 9.9 mg/dL (ref 8.9–10.3)
Creatinine, Ser: 1.04 mg/dL (ref 0.61–1.24)
GFR calc non Af Amer: >60 mL/min (ref >60)
GLUCOSE: 119 mg/dL — AB (ref 65–99)
POTASSIUM: 4.2 mmol/L (ref 3.5–5.1)
SODIUM: 140 mmol/L (ref 135–145)
Total Bilirubin: 1 mg/dL (ref 0.3–1.2)
Total Protein: 6.3 g/dL — ABNORMAL LOW (ref 6.5–8.1)

## 2017-08-01 LAB — FERRITIN: FERRITIN: 41 ng/mL (ref 24–336)

## 2017-08-02 ENCOUNTER — Other Ambulatory Visit (HOSPITAL_COMMUNITY): Payer: PPO

## 2017-08-02 ENCOUNTER — Encounter (HOSPITAL_COMMUNITY): Payer: Self-pay

## 2017-08-09 ENCOUNTER — Inpatient Hospital Stay (HOSPITAL_COMMUNITY): Payer: Medicare HMO | Attending: Internal Medicine | Admitting: Internal Medicine

## 2017-08-09 ENCOUNTER — Encounter (HOSPITAL_COMMUNITY): Payer: Self-pay | Admitting: Internal Medicine

## 2017-08-09 ENCOUNTER — Other Ambulatory Visit: Payer: Self-pay

## 2017-08-09 VITALS — BP 144/79 | HR 66 | Temp 97.7°F | Resp 20 | Wt 272.8 lb

## 2017-08-09 DIAGNOSIS — D696 Thrombocytopenia, unspecified: Secondary | ICD-10-CM | POA: Insufficient documentation

## 2017-08-09 DIAGNOSIS — R69 Illness, unspecified: Secondary | ICD-10-CM | POA: Diagnosis not present

## 2017-08-09 DIAGNOSIS — R161 Splenomegaly, not elsewhere classified: Secondary | ICD-10-CM

## 2017-08-09 DIAGNOSIS — Z87891 Personal history of nicotine dependence: Secondary | ICD-10-CM

## 2017-08-09 DIAGNOSIS — Z79899 Other long term (current) drug therapy: Secondary | ICD-10-CM

## 2017-08-09 DIAGNOSIS — D72829 Elevated white blood cell count, unspecified: Secondary | ICD-10-CM

## 2017-08-09 NOTE — Patient Instructions (Signed)
[place] at [facility] Discharge Instructions   You were seen today by Dr. Zoila Shutter. She went over your lab results today and everything looked good. She discussed how you've been feeling and your hospital stay in January. We will see you in 6 months for labs and follow up.    Thank you for choosing [place] at [facility] to provide your oncology and hematology care.  To afford each patient quality time with our provider, please arrive at least 15 minutes before your scheduled appointment time.    If you have a lab appointment with the [facility] please come in thru the  Main Entrance and check in at the main information desk  You need to re-schedule your appointment should you arrive 10 or more minutes late.  We strive to give you quality time with our providers, and arriving late affects you and other patients whose appointments are after yours.  Also, if you no show three or more times for appointments you may be dismissed from the clinic at the providers discretion.     Again, thank you for choosing [facility].  Our hope is that these requests will decrease the amount of time that you wait before being seen by our physicians.       _____________________________________________________________  Should you have questions after your visit to [facility], please contact our office at [phone] between the hours of 8:30 a.m. and 4:30 p.m.  Voicemails left after 4:30 p.m. will not be returned until the following business day.  For prescription refill requests, have your pharmacy contact our office.       Resources For Cancer Patients and their Caregivers ? American Cancer Society: Can assist with transportation, wigs, general needs, runs Look Good Feel Better.        7478234549 ? Cancer Care: Provides financial assistance, online support groups, medication/co-pay assistance.  1-800-813-HOPE  (703)603-8770) ? [facility] Assists [place] cancer patients and their families through emotional , educational and financial support.  770-663-6281 ? [place] DSS Where to apply for food stamps, Medicaid and utility assistance. (820)133-0364 ? [facility]: Transportation to medical appointments. 615-119-9407 ? Social Security Administration: May apply for disability if have a Stage IV cancer. (386)033-6101 (651)105-6099 ? [employer], Disability and Transit Services: Assists with nutrition, care and transit needs. [place] Support Programs:   > Cancer Support Group  2nd Tuesday of the month 1pm-2pm, Journey Room   > Creative Journey  3rd Tuesday of the month 1130am-1pm, Journey Room

## 2017-08-09 NOTE — Progress Notes (Signed)
Diagnosis No diagnosis found.  Staging Cancer Staging No matching staging information was found for the patient.  Assessment and Plan: Leukocytosis, neutrophilia.  Pt was followed by Dr. Talbert Cage.  Labs done 08/01/2017 showed a white count 10.4 hemoglobin 14.8 platelets 103,000.  Creatinine is normal at 1 ferritin is 41.  White count is within normal limits on recent labs.  He had previous evaluation with a negative BCR/ABL  and normal sed rate, C-reactive protein,.  He also had a negative Jak 2 evaluation.  He will continue to follow-up for ongoing monitoring of labs.  2.  Thrombocytopenia.  Patient's platelet count is 103,000.  CT chest abdomen and pelvis done 06/02/2017 showed mild splenomegaly.  We will continue to monitor labs on return to clinic.  3.  Colon lesion.  He had CT OF ABDOMEN AND PELVIS DONE 05/27/2017 that showed  IMPRESSION: 1. Focal inflamed masslike area involving the cecum at the level of the terminal ileum. Findings may represent focal cecal diverticulitis, an infectious or inflammatory process or potentially cecal mass/neoplastic process. Recommend clinical and laboratory correlation. Additionally, patient will need further evaluation with colonoscopy after resolution of the acute symptomatology. 2. Mild wall thickening of the rectum, potentially sequelae of infectious process. 3. Wall thickening of the urinary bladder raising the possibility of cystitis. 4. Re- demonstrated slowly enlarging soft tissue mass within the presacral location. Recommend further evaluation with pelvic MRI. 5. Mild splenomegaly.  Was seen by GI in the past and will be referred back to GI for evaluation.    4. Presacral mass.   MRI of the pelvis done 05/30/2017 shows:   IMPRESSION: 1. 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 Trace edema in the adjacent fascia plane with the perirectal space. There was some  faint nodularity in this vicinity in 2015 but no abnormality in the area prior to that. The lesion has slowly enlarged and seems to have a potentially linear fatty element extending sagittally in the right paracentral position. Given the fatty element, possibilities may include myelolipoma, extramedullary hematopoiesis, solitary fibrous tumor, low-grade lymphoma, or soft tissue hemangioma. Given the slow growth and fatty elements this would be unusual for metastatic disease. Appearance not considered characteristic for chordoma and would be unusual for a neurogenic origin tumor such as schwannoma based on the intermediate T2 signal. Consider surveillance or resection.  Will discuss scans with radiology or surgery to determine if biopsy possible.    Interval History:  77 year old male followed by Dr. Talbert Cage  for leukocytosis and thrombocytopenia.  He has undergone evaluation with normal BCR/ABL,  normal sed rate, C-reactive protein, negative Jak 2.  Current Status: Patient is seen today for follow-up.  He is here to go over labs.  Reports recent hospitalization with imaging.    Problem List Patient Active Problem List   Diagnosis Date Noted  . Diverticulitis of colon [K57.32] 06/28/2017  . Gastrointestinal hemorrhage with melena [K92.1] 05/27/2017  . Acute cystitis [N30.00] 05/27/2017  . Pelvic mass in male [R19.00] 05/27/2017  . Acute diverticulitis [K57.92] 05/27/2017  . Heme positive stool [R19.5] 05/27/2017  . Prostatitis [N41.9] 05/27/2017  . S/P colonoscopy [Z98.890] 05/27/2017  . Bilateral primary osteoarthritis of knee [M17.0] 04/06/2017  . Weakness [R53.1] 02/27/2017  . Ventral hernia [K43.9] 02/27/2017  . Acute CVA (cerebrovascular accident) ([place]) [I63.9] 09/13/2016  . Stroke (cerebrum) ([place]) [I63.9] 09/13/2016  . Syncope [R55] 09/12/2016  . Iron deficiency [E61.1] 07/07/2016  . Back  pain [M54.9] 03/22/2016  . Acute right-sided low back pain with right-sided sciatica  [M54.41]   . S/P nasal septoplasty [Z98.890] 10/07/2015  . Parotitis, acute [K11.21] 03/06/2015  . Nausea without vomiting [R11.0] 12/08/2014  . Dysphagia, pharyngoesophageal phase [R13.14]   . History of colonic polyps [Z86.010]   . Hypokalemia [E87.6] 09/01/2013  . Dehydration [E86.0] 09/01/2013  . Leukocytosis [D72.829] 09/01/2013  . Diastolic dysfunction by echo Dec 2014 [I51.9] 07/30/2013  . Morbid obesity- BMI 44.5 [E66.01] 07/30/2013  . Edema of both legs [R60.0] 07/30/2013  . Bilateral lower leg cellulitis [L03.116, L03.115] 07/18/2013  . 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 OSA (obstructive sleep apnea) [G47.33] 11/27/2012  . Other spondylosis with radiculopathy, lumbar region [facility] 08/22/2012  . Bright red blood per rectum [K62.5] 06/15/2012  . Constipation [K59.00] 02/29/2012  . SIRS (systemic inflammatory response syndrome) ([facility]) [R65.10] 02/27/2012  . Hyperlipidemia [E78.5] 02/27/2012  . Thrombocytopenia ([place]) [D69.6] 12/30/2011  . Hypothyroidism [E03.9] 10/27/2011  . Upper abdominal pain [R10.10] 10/27/2011  . Rectal bleeding [K62.5] 11/16/2010  . DIARRHEA [R19.7] 06/08/2010  . COLONIC POLYPS, ADENOMATOUS, HX OF [Z86.010] 07/16/2009  . Essential hypertension [I10] 06/19/2009  . GASTROESOPHAGEAL REFLUX DISEASE, CHRONIC [K21.9] 06/19/2009  . FATTY LIVER DISEASE [K76.89] 06/19/2009  . CHOLELITHIASIS, SYMPTOMATIC [K80.20] 06/19/2009  . ABDOMINAL BLOATING [R14.3, R14.1, R14.2] 06/19/2009    Past Medical History Past Medical History:  Diagnosis Date  . Assistance needed for mobility    cane, walker  . Bilateral lower leg cellulitis   . BPH (benign prostatic hyperplasia)   . Cataract    left  . Chronic diastolic HF (heart failure) ([place])   . Chronic low back pain   . Collagen vascular disease ([place])    history of venous insufficency and LE cellulitis  . Complication of anesthesia    2012 due to sleep apnea pt has had difficulty being put under   and waking up from anesthesia  . Deep venous insufficiency   . GERD (gastroesophageal reflux disease)   . Glaucoma    left  . Headache   . Heel spur    right heel  . History of bladder infections   . History of colonic polyps   . Hyperlipidemia   . Hypertension    Sees Dr. Debara Pickett  . Hypothyroidism    hypothyroid denies no rx  . Iron deficiency 07/07/2016  . Kidney stones    passed them  . Obesity   . Osteoarthritis   . PONV (postoperative nausea and vomiting)   . PUD (peptic ulcer disease)   . Ringing in ears   . S/P colonoscopy 2007, Feb and March 2012   hx of adenomas, left-sided diverticula, On 07/2010 TCS, fresh blood and clot noted coming from TI.  . S/P endoscopy 2007, 2012   2007: nl, May 2012: antral erosions  . SIRS (systemic inflammatory response syndrome) ([place]) 02/27/2012  . Sleep apnea    supposed to see Dr. Luan Pulling to get it started back, hasn't been on in since 2018  . Thrombocytopenia ([place]) 12/30/2011   Stable    Past Surgical History Past Surgical History:  Procedure Laterality Date  . BACK SURGERY    . CARDIAC CATHETERIZATION  02/27/2007   no sign of CAD, LVH, mod pulm HTN (Dr. Jackie Plum(  . CATARACT EXTRACTION W/ INTRAOCULAR LENS IMPLANT Right ~ 2012  . COLONOSCOPY N/A 05/28/2014   XHB:ZJIRCVEL colonic polyps/colonic diverticulosis. Tubular adenomas. Next colonoscopy January 2021  . ESOPHAGOGASTRODUODENOSCOPY  Aug 2013  [facility], single 56m pedunculated polyp otherwise normal. HYPERPLASTIC, NEGATIVE h.pylori  . ESOPHAGOGASTRODUODENOSCOPY N/A 05/28/2014   RMR:non-critical Schatzki's ring/HH. Benign gastritis  . INGUINAL HERNIA REPAIR Right 1941  . KNEE ARTHROSCOPY Left ~ 2000  . LAPAROSCOPIC CHOLECYSTECTOMY    . LHazenSURGERY  2012  . MALONEY DILATION N/A 05/28/2014   Procedure: MVenia MinksDILATION;  Surgeon: RDaneil Dolin MD;  [facility];  Service: Endoscopy;  Laterality: N/A;  . NASAL SEPTOPLASTY W/ TURBINOPLASTY Bilateral 10/07/2015    Procedure: NASAL SEPTOPLASTY WITH TURBINATE REDUCTION;  Surgeon: SLeta Baptist MD;  [place]  Service: ENT;  Laterality: Bilateral;  . NASAL SEPTUM SURGERY Bilateral 10/07/2015    inferior turbinate resection  . NM MYOCAR PERF WALL MOTION  12/2006   dipyridamole myoview - stress images show mild perfusion defect in mid inferior walls with reversibility at rest; mild perfusion defect in mid inferolateral wall at stress with mild defect reversibility, EF 62%, abnormal but low risk study   . POSTERIOR LUMBAR FUSION  2015  . SAVORY DILATION N/A 05/28/2014   Procedure: SAVORY DILATION;  Surgeon: RDaneil Dolin MD;  Location: [facility];  Service: Endoscopy;  Laterality: N/A;  . SHOULDER OPEN ROTATOR CUFF REPAIR Left early 2000s  . small bowel capsule study  07/2011   ?transient focal ischemia in distal small bowel to explain GI bleeding  . TRANSTHORACIC ECHOCARDIOGRAM  09/2008   EF 60-65%, mod conc LVH  . TRANSURETHRAL RESECTION OF PROSTATE    . TRANSURETHRAL RESECTION OF PROSTATE N/A 06/18/2013   Procedure: TRANSURETHRAL RESECTION OF THE PROSTATE (TURP);  Surgeon: MMarissa Nestle MD;  Location: [facility];  Service: Urology;  Laterality: N/A;    Family History Family History  Problem Relation Age of Onset  . Colon cancer Father 643      deceased  . Prostate cancer Father   . Pancreatic cancer Mother 53      deceased  . Prostate cancer Brother   . Arthritis Son   . Arthritis Son   . Diabetes Mellitus II Son      Social History  reports that he quit smoking about 44 years ago. His smoking use included cigars. He started smoking about 57 years ago. He quit after 14.00 years of use. he has never used smokeless tobacco. He reports that he does not drink alcohol or use drugs.  Medications  Current Outpatient Medications:  .  metoprolol tartrate (LOPRESSOR) 25 MG tablet, Take 25 mg by mouth 2 (two) times daily., Disp: , Rfl:  .  acetaminophen (TYLENOL) 325 MG tablet, Take 2 tablets (650  mg total) by mouth every 6 (six) hours as needed for mild pain (or Fever >/= 101)., Disp: , Rfl:  .  bisacodyl (DULCOLAX) 5 MG EC tablet, Take 1 tablet (5 mg total) by mouth daily., Disp: 30 tablet, Rfl: 0 .  brimonidine (ALPHAGAN) 0.2 % ophthalmic solution, Place 1 drop into both eyes 2 (two) times daily., Disp: , Rfl:  .  Cholecalciferol (VITAMIN D3) 1000 units CAPS, Take 1 capsule by mouth. Takes 3 times per week, Disp: , Rfl:  .  clopidogrel (PLAVIX) 75 MG tablet, Take 1 tablet (75 mg total) by mouth daily., Disp: 90 tablet, Rfl: 3 .  diclofenac sodium (VOLTAREN) 1 % GEL, Apply 4 g 4 (four) times daily as needed topically., Disp: 2 Tube, Rfl: 3 .  dorzolamide-timolol (COSOPT) 22.3-6.8 MG/ML ophthalmic solution, Place 1 drop into both eyes 2 (two) times daily.,  Disp: , Rfl:  .  ferrous sulfate 325 (65 FE) MG tablet, Take 325 mg by mouth daily with breakfast., Disp: , Rfl:  .  latanoprost (XALATAN) 0.005 % ophthalmic solution, Place 1 drop into both eyes at bedtime., Disp: , Rfl:  .  Menthol, Topical Analgesic, (BIOFREEZE) 4 % GEL, To be used by therapy dept as needed, Disp: , Rfl:  .  methocarbamol (ROBAXIN) 500 MG tablet, Take 1 tablet (500 mg total) by mouth 2 (two) times daily., Disp: 20 tablet, Rfl: 0 .  Oxycodone HCl 10 MG TABS, Take 1 tablet (10 mg total) by mouth 2 (two) times daily as needed (pain)., Disp: 60 tablet, Rfl: 0 .  Polyethyl Glycol-Propyl Glycol (LUBRICANT EYE DROPS) 0.4-0.3 % SOLN, Place 1 drop into both eyes daily as needed (for lubricant eye drops). , Disp: , Rfl:  .  potassium chloride (K-DUR,KLOR-CON) 10 MEQ tablet, Take 40 mEq by mouth 2 (two) times daily. , Disp: , Rfl:   Allergies Aspirin and Nexium [esomeprazole magnesium]  Review of Systems Review of Systems - Oncology ROS as per HPI otherwise 12 point ROS is negative.   Physical Exam  Vitals Wt Readings from Last 3 Encounters:  08/09/17 272 lb 12.8 oz (123.7 kg)  06/28/17 276 lb 3.2 oz (125.3 kg)   06/02/17 265 lb (120.2 kg)   Temp Readings from Last 3 Encounters:  08/09/17 97.7 F (36.5 C) (Oral)  06/28/17 (!) 97.2 F (36.2 C) (Oral)  06/02/17 98.6 F (37 C) (Oral)   BP Readings from Last 3 Encounters:  08/09/17 (!) 144/79  06/28/17 (!) 172/94  06/02/17 (!) 144/81   Pulse Readings from Last 3 Encounters:  08/09/17 66  06/28/17 86  06/02/17 92   Constitutional: Well-developed, well-nourished, and in no distress.   HENT: Head: Normocephalic and atraumatic.  Mouth/Throat: No oropharyngeal exudate. Mucosa moist. Eyes: Pupils are equal, round, and reactive to light. Conjunctivae are normal. No scleral icterus.  Neck: Normal range of motion. Neck supple. No JVD present.  Cardiovascular: Normal rate, regular rhythm and normal heart sounds.  Exam reveals no gallop and no friction rub.   No murmur heard. Pulmonary/Chest: Effort normal and breath sounds normal. No respiratory distress. No wheezes.No rales.  Abdominal: Soft. Bowel sounds are normal. No distension. There is no tenderness. There is no guarding.  Musculoskeletal: No edema or tenderness.  Lymphadenopathy: No cervical, axillary or supraclavicular adenopathy.  Neurological: Alert and oriented to person, place, and time. No cranial nerve deficit.  Skin: Skin is warm and dry. No rash noted. No erythema. No pallor.  Psychiatric: Affect and judgment normal.   Labs No visits with results within 3 Day(s) from this visit.  Latest known visit with results is:  Appointment on 08/01/2017  Component Date Value Ref Range Status  . WBC 08/01/2017 10.4  4.0 - 10.5 K/uL Final  . RBC 08/01/2017 5.66  4.22 - 5.81 MIL/uL Final  . Hemoglobin 08/01/2017 14.8  13.0 - 17.0 g/dL Final  . HCT 08/01/2017 46.1  39.0 - 52.0 % Final  . MCV 08/01/2017 81.4  78.0 - 100.0 fL Final  . MCH 08/01/2017 26.1  26.0 - 34.0 pg Final  . MCHC 08/01/2017 32.1  30.0 - 36.0 g/dL Final  . RDW 08/01/2017 16.8* 11.5 - 15.5 % Final  . Platelets 08/01/2017  103* 150 - 400 K/uL Final   Comment: PLATELET COUNT CONFIRMED BY SMEAR SPECIMEN CHECKED FOR CLOTS   . Neutrophils Relative % 08/01/2017 81  % Final  .  Neutro Abs 08/01/2017 8.4* 1.7 - 7.7 K/uL Final  . Lymphocytes Relative 08/01/2017 16  % Final  . Lymphs Abs 08/01/2017 1.6  0.7 - 4.0 K/uL Final  . Monocytes Relative 08/01/2017 2  % Final  . Monocytes Absolute 08/01/2017 0.2  0.1 - 1.0 K/uL Final  . Eosinophils Relative 08/01/2017 1  % Final  . Eosinophils Absolute 08/01/2017 0.1  0.0 - 0.7 K/uL Final  . Basophils Relative 08/01/2017 0  % Final  . Basophils Absolute 08/01/2017 0.0  0.0 - 0.1 K/uL Final   Performed at [facility], [address], [place], [place] [address]  . Sodium 08/01/2017 140  135 - 145 mmol/L Final  . Potassium 08/01/2017 4.2  3.5 - 5.1 mmol/L Final  . Chloride 08/01/2017 105  101 - 111 mmol/L Final  . CO2 08/01/2017 27  22 - 32 mmol/L Final  . Glucose, Bld 08/01/2017 119* 65 - 99 mg/dL Final  . BUN 08/01/2017 14  6 - 20 mg/dL Final  . Creatinine, Ser 08/01/2017 1.04  0.61 - 1.24 mg/dL Final  . Calcium 08/01/2017 9.9  8.9 - 10.3 mg/dL Final  . Total Protein 08/01/2017 6.3* 6.5 - 8.1 g/dL Final  . Albumin 08/01/2017 3.5  3.5 - 5.0 g/dL Final  . AST 08/01/2017 16  15 - 41 U/L Final  . ALT 08/01/2017 9* 17 - 63 U/L Final  . Alkaline Phosphatase 08/01/2017 65  38 - 126 U/L Final  . Total Bilirubin 08/01/2017 1.0  0.3 - 1.2 mg/dL Final  . GFR calc non Af Amer 08/01/2017 >60  >60 mL/min Final  . GFR calc Af Amer 08/01/2017 >60  >60 mL/min Final   Comment: (NOTE) The eGFR has been calculated using the CKD EPI equation. This calculation has not been validated in all clinical situations. eGFR's persistently <60 mL/min signify possible Chronic Kidney Disease.   Georgiann Hahn gap 08/01/2017 8  5 - 15 Final   Performed at [facility], [address] [place], [place] [address]  . Iron 08/01/2017 50  45 - 182 ug/dL Final  . TIBC 08/01/2017 234* 250 - 450 ug/dL Final  .  Saturation Ratios 08/01/2017 21  17.9 - 39.5 % Final  . UIBC 08/01/2017 184  ug/dL Final   Performed at [facility], [place] [address], [place], [place] [address]  . Ferritin 08/01/2017 41  24 - 336 ng/mL Final   Performed at [facility] [address], [place], [place] [address]     Pathology No orders of the defined types were placed in this encounter.      Zoila Shutter MD

## 2017-08-17 DIAGNOSIS — R600 Localized edema: Secondary | ICD-10-CM | POA: Diagnosis not present

## 2017-08-17 DIAGNOSIS — Z6841 Body Mass Index (BMI) 40.0 and over, adult: Secondary | ICD-10-CM | POA: Diagnosis not present

## 2017-08-17 DIAGNOSIS — I639 Cerebral infarction, unspecified: Secondary | ICD-10-CM | POA: Diagnosis not present

## 2017-08-17 DIAGNOSIS — E669 Obesity, unspecified: Secondary | ICD-10-CM | POA: Diagnosis not present

## 2017-08-17 DIAGNOSIS — I872 Venous insufficiency (chronic) (peripheral): Secondary | ICD-10-CM | POA: Diagnosis not present

## 2017-08-17 DIAGNOSIS — I5032 Chronic diastolic (congestive) heart failure: Secondary | ICD-10-CM | POA: Diagnosis not present

## 2017-08-17 DIAGNOSIS — I10 Essential (primary) hypertension: Secondary | ICD-10-CM | POA: Diagnosis not present

## 2017-08-17 DIAGNOSIS — E1129 Type 2 diabetes mellitus with other diabetic kidney complication: Secondary | ICD-10-CM | POA: Diagnosis not present

## 2017-08-17 DIAGNOSIS — G43909 Migraine, unspecified, not intractable, without status migrainosus: Secondary | ICD-10-CM | POA: Diagnosis not present

## 2017-08-21 DIAGNOSIS — H401123 Primary open-angle glaucoma, left eye, severe stage: Secondary | ICD-10-CM | POA: Diagnosis not present

## 2017-08-21 DIAGNOSIS — H401113 Primary open-angle glaucoma, right eye, severe stage: Secondary | ICD-10-CM | POA: Diagnosis not present

## 2017-08-22 DIAGNOSIS — R69 Illness, unspecified: Secondary | ICD-10-CM | POA: Diagnosis not present

## 2017-08-24 DIAGNOSIS — M25561 Pain in right knee: Secondary | ICD-10-CM | POA: Diagnosis not present

## 2017-08-24 DIAGNOSIS — Z789 Other specified health status: Secondary | ICD-10-CM | POA: Diagnosis not present

## 2017-08-24 DIAGNOSIS — M1711 Unilateral primary osteoarthritis, right knee: Secondary | ICD-10-CM | POA: Diagnosis not present

## 2017-08-24 DIAGNOSIS — M25661 Stiffness of right knee, not elsewhere classified: Secondary | ICD-10-CM | POA: Diagnosis not present

## 2017-08-24 DIAGNOSIS — R269 Unspecified abnormalities of gait and mobility: Secondary | ICD-10-CM | POA: Diagnosis not present

## 2017-09-12 ENCOUNTER — Ambulatory Visit: Payer: Medicare HMO | Admitting: Nurse Practitioner

## 2017-09-12 ENCOUNTER — Encounter: Payer: Self-pay | Admitting: Nurse Practitioner

## 2017-09-12 VITALS — BP 139/91 | HR 73 | Temp 97.4°F | Ht 67.0 in | Wt 273.2 lb

## 2017-09-12 DIAGNOSIS — I739 Peripheral vascular disease, unspecified: Secondary | ICD-10-CM | POA: Diagnosis not present

## 2017-09-12 DIAGNOSIS — R110 Nausea: Secondary | ICD-10-CM

## 2017-09-12 DIAGNOSIS — K5732 Diverticulitis of large intestine without perforation or abscess without bleeding: Secondary | ICD-10-CM | POA: Diagnosis not present

## 2017-09-12 DIAGNOSIS — D696 Thrombocytopenia, unspecified: Secondary | ICD-10-CM

## 2017-09-12 NOTE — Progress Notes (Signed)
Referring Provider: Redmond School, MD Primary Care Physician:  Redmond School, MD Primary GI:  Dr. Gala Romney  Chief Complaint  Patient presents with  . Nausea    occ    HPI:   [name] [name]. is a 77 y.o. male who presents for follow-up on GERD, nausea, diverticulitis.  Patient was last seen in our office 06/28/2017.  History of worsening symptoms with pharmacy changing medications to pill pack style and having to take all his medicines at one time.  Noted history of admission 05/27/2017 through 06/01/2017 for UTI, urinary retention, melena found to be heme positive.  Followed by urology.  Started on Pepcid and Protonix in the hospital.  No ongoing bleeding and therefore question of cecal diverticulitis on CT exam.  He was treated with antibiotics.  Return to the emergency department the day after discharge with nausea, vomiting, abdominal pain and CT repeat was reassuring.  At his last visit he was doing okay, some morning time nausea with effective treatment through antiemetics.  Intermittent epigastric pain that occurs with nausea typically 3 times a week and variable in intensity.  No other GI symptoms.  The patient was given educational materials related to food choices for GERD, continue acid blockers, check CBC.  Consider repeat EGD pending symptom progression and lab results.  CBC completed 08/01/2017 which found normal hemoglobin, normal white blood cell count although upper limit normal at 10.4.  Platelet count somewhat depressed at 103.  Additional labs drawn at that time by another provider found CMP with normal kidney and liver function, iron normal, iron saturation normal, ferritin normal at 41.  interestingly, his recent CT of the abdomen for hepatobiliary states "previous cholecystectomy, no focal liver finding."  And spleen is noted "mild splenomegaly as previously seen."  Query some element of fibrosis causing portal hypertension, splenomegaly, thrombocytopenia.  Today he  states he's doing well overall. Mild intermittent nausea which is relieved with Zofran. Denies persistent abdominal pain (occasional gas pains), vomiting, hematochezia, melena, fever, chills, unintentional weight loss. Denies excessive bruising/bleeding. Denies chest pain, dyspnea, dizziness, lightheadedness, syncope, near syncope. Denies any other upper or lower GI symptoms. Due for colonoscopy 2021.  Past Medical History:  Diagnosis Date  . Assistance needed for mobility    cane, walker  . Bilateral lower leg cellulitis   . BPH (benign prostatic hyperplasia)   . Cataract    left  . Chronic diastolic HF (heart failure) ([place])   . Chronic low back pain   . Collagen vascular disease ([place])    history of venous insufficency and LE cellulitis  . Complication of anesthesia    2012 due to sleep apnea pt has had difficulty being put under  and waking up from anesthesia  . Deep venous insufficiency   . GERD (gastroesophageal reflux disease)   . Glaucoma    left  . Headache   . Heel spur    right heel  . History of bladder infections   . History of colonic polyps   . Hyperlipidemia   . Hypertension    Sees Dr. Debara Pickett  . Hypothyroidism    hypothyroid denies no rx  . Iron deficiency 07/07/2016  . Kidney stones    passed them  . Obesity   . Osteoarthritis   . PONV (postoperative nausea and vomiting)   . PUD (peptic ulcer disease)   . Ringing in ears   . S/P colonoscopy 2007, Feb and March 2012   hx of adenomas, left-sided diverticula, On  07/2010 TCS, fresh blood and clot noted coming from TI.  . S/P endoscopy 2007, 2012   2007: nl, May 2012: antral erosions  . SIRS (systemic inflammatory response syndrome) ([place]) 02/27/2012  . Sleep apnea    supposed to see Dr. Luan Pulling to get it started back, hasn't been on in since 2018  . Thrombocytopenia ([place]) 12/30/2011   Stable    Past Surgical History:  Procedure Laterality Date  . BACK SURGERY    . CARDIAC CATHETERIZATION  02/27/2007   no sign  of CAD, LVH, mod pulm HTN (Dr. Jackie Plum(  . CATARACT EXTRACTION W/ INTRAOCULAR LENS IMPLANT Right ~ 2012  . COLONOSCOPY N/A 05/28/2014   EGB:[id number] colonic polyps/colonic diverticulosis. Tubular adenomas. Next colonoscopy January 2021  . ESOPHAGOGASTRODUODENOSCOPY  Aug 2013   [facility], single 81mm pedunculated polyp otherwise normal. HYPERPLASTIC, NEGATIVE h.pylori  . ESOPHAGOGASTRODUODENOSCOPY N/A 05/28/2014   RMR:non-critical Schatzki's ring/HH. Benign gastritis  . INGUINAL HERNIA REPAIR Right 1941  . KNEE ARTHROSCOPY Left ~ 2000  . LAPAROSCOPIC CHOLECYSTECTOMY    . [place] SURGERY  2012  . MALONEY DILATION N/A 05/28/2014   Procedure: Venia Minks DILATION;  Surgeon: Daneil Dolin, MD;  [facility];  Service: Endoscopy;  Laterality: N/A;  . NASAL SEPTOPLASTY W/ TURBINOPLASTY Bilateral 10/07/2015   Procedure: NASAL SEPTOPLASTY WITH TURBINATE REDUCTION;  Surgeon: Leta Baptist, MD;  Location: [place];  Service: ENT;  Laterality: Bilateral;  . NASAL SEPTUM SURGERY Bilateral 10/07/2015    inferior turbinate resection  . NM MYOCAR PERF WALL MOTION  12/2006   dipyridamole myoview - stress images show mild perfusion defect in mid inferior walls with reversibility at rest; mild perfusion defect in mid inferolateral wall at stress with mild defect reversibility, EF 62%, abnormal but low risk study   . POSTERIOR LUMBAR FUSION  2015  . SAVORY DILATION N/A 05/28/2014   Procedure: SAVORY DILATION;  Surgeon: Daneil Dolin, MD;  [facility];  Service: Endoscopy;  Laterality: N/A;  . SHOULDER OPEN ROTATOR CUFF REPAIR Left early 2000s  . small bowel capsule study  07/2011   ?transient focal ischemia in distal small bowel to explain GI bleeding  . TRANSTHORACIC ECHOCARDIOGRAM  09/2008   EF 60-65%, mod conc LVH  . TRANSURETHRAL RESECTION OF PROSTATE    . TRANSURETHRAL RESECTION OF PROSTATE N/A 06/18/2013   Procedure: TRANSURETHRAL RESECTION OF THE PROSTATE (TURP);  Surgeon: Marissa Nestle, MD;   Location: [facility];  Service: Urology;  Laterality: N/A;    Current Outpatient Medications  Medication Sig Dispense Refill  . acetaminophen (TYLENOL) 325 MG tablet Take 2 tablets (650 mg total) by mouth every 6 (six) hours as needed for mild pain (or Fever >/= 101).    . bisacodyl (DULCOLAX) 5 MG EC tablet Take 1 tablet (5 mg total) by mouth daily. 30 tablet 0  . brimonidine (ALPHAGAN) 0.2 % ophthalmic solution Place 1 drop into both eyes 2 (two) times daily.    . Cholecalciferol (VITAMIN D3) 1000 units CAPS Take 1 capsule by mouth. Takes 3 times per week    . clopidogrel (PLAVIX) 75 MG tablet Take 1 tablet (75 mg total) by mouth daily. 90 tablet 3  . diclofenac sodium (VOLTAREN) 1 % GEL Apply 4 g 4 (four) times daily as needed topically. 2 Tube 3  . dorzolamide-timolol (COSOPT) 22.3-6.8 MG/ML ophthalmic solution Place 1 drop into both eyes 2 (two) times daily.    . ferrous sulfate 325 (65 FE) MG tablet Take 325 mg  by mouth daily with breakfast.    . latanoprost (XALATAN) 0.005 % ophthalmic solution Place 1 drop into both eyes at bedtime.    . Menthol, Topical Analgesic, (BIOFREEZE) 4 % GEL To be used by therapy dept as needed    . methocarbamol (ROBAXIN) 500 MG tablet Take 1 tablet (500 mg total) by mouth 2 (two) times daily. 20 tablet 0  . metoprolol tartrate (LOPRESSOR) 25 MG tablet Take 25 mg by mouth 2 (two) times daily.    . Oxycodone HCl 10 MG TABS Take 1 tablet (10 mg total) by mouth 2 (two) times daily as needed (pain). 60 tablet 0  . Polyethyl Glycol-Propyl Glycol (LUBRICANT EYE DROPS) 0.4-0.3 % SOLN Place 1 drop into both eyes daily as needed (for lubricant eye drops).     . potassium chloride (K-DUR,KLOR-CON) 10 MEQ tablet Take 40 mEq by mouth 2 (two) times daily.      No current facility-administered medications for this visit.     Allergies as of 09/12/2017 - Review Complete 09/12/2017  Allergen Reaction Noted  . Aspirin Other (See Comments)   . Nexium [esomeprazole magnesium]  Other (See Comments) 11/14/2013    Family History  Problem Relation Age of Onset  . Colon cancer Father 36       deceased  . Prostate cancer Father   . Pancreatic cancer Mother 77       deceased  . Prostate cancer Brother   . Arthritis Son   . Arthritis Son   . Diabetes Mellitus II Son     Social History   Socioeconomic History  . Marital status: Widowed    Spouse name: Roger Kill  . Number of children: 5  . Years of education: College  . Highest education level: Not on file  Occupational History  . Occupation: disability    Employer: RETIRED  Social Needs  . Financial resource strain: Not on file  . Food insecurity:    Worry: Not on file    Inability: Not on file  . Transportation needs:    Medical: Not on file    Non-medical: Not on file  Tobacco Use  . Smoking status: Former Smoker    Years: 14.00    Types: Cigars    Start date: 11/20/1959    Last attempt to quit: 08/10/1973    Years since quitting: 44.1  . Smokeless tobacco: Never Used  Substance and Sexual Activity  . Alcohol use: No    Alcohol/week: 0.0 oz    Comment: denies  . Drug use: No  . Sexual activity: Never    Partners: Male    Birth control/protection: None  Lifestyle  . Physical activity:    Days per week: Not on file    Minutes per session: Not on file  . Stress: Not on file  Relationships  . Social connections:    Talks on phone: Not on file    Gets together: Not on file    Attends religious service: Not on file    Active member of club or organization: Not on file    Attends meetings of clubs or organizations: Not on file    Relationship status: Not on file  Other Topics Concern  . Not on file  Social History Narrative   Patient lives at home with spouse and son.   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     Review of Systems: General: Negative for anorexia, weight loss, fever, chills, fatigue,  weakness. ENT: Negative for hoarseness, difficulty  swallowing. CV: Negative for chest pain, angina, palpitations, peripheral edema.  Respiratory: Negative for dyspnea at rest, cough, sputum, wheezing.  GI: See history of present illness. Endo: Negative for unusual weight change.  Heme: Negative for bruising or bleeding. Allergy: Negative for rash or hives.   Physical Exam: BP (!) 139/91   Pulse 73   Temp (!) 97.4 F (36.3 C) (Oral)   Ht 5\' 7"  (1.702 m)   Wt 273 lb 3.2 oz (123.9 kg)   BMI 42.79 kg/m  General:   Alert and oriented. Pleasant and cooperative. Well-nourished and well-developed.  Eyes:  Without icterus, sclera clear and conjunctiva pink.  Ears:  Normal auditory acuity. Cardiovascular:  S1, S2 present without murmurs appreciated. Extremities without clubbing. Bilateral 1-2+ LE edema noted. Respiratory:  Clear to auscultation bilaterally. No wheezes, rales, or rhonchi. No distress.  Gastrointestinal:  +BS, soft, non-tender and non-distended. No HSM noted. No guarding or rebound. No masses appreciated.  Rectal:  Deferred  Musculoskalatal:  Symmetrical without gross deformities. Neurologic:  Alert and oriented x4;  grossly normal neurologically. Psych:  Alert and cooperative. Normal mood and affect. Heme/Lymph/Immune: No excessive bruising noted.    09/12/2017 8:59 AM   Disclaimer: This note was dictated with voice recognition software. Similar sounding words can inadvertently be transcribed and may not be corrected upon review.

## 2017-09-12 NOTE — Assessment & Plan Note (Signed)
Previous bout with diverticulitis.  Currently no ongoing abdominal pain.  Continue to monitor, notify us of worsening symptoms.  Continue current medications.  Follow-up in 1 year.

## 2017-09-12 NOTE — Progress Notes (Signed)
cc'ed to pcp °

## 2017-09-12 NOTE — Assessment & Plan Note (Signed)
Trauma cytopenia since 2009 currently stable.  He is followed by oncology for this.  Review of recent imaging finds no significant liver changes.  Recommend he continue to see oncology as recommended.  Follow-up in 1 year.

## 2017-09-12 NOTE — Patient Instructions (Signed)
1. Continue your current medications. 2. Continue to see your other healthcare providers as they recommend. 3. Follow-up in 1 year in our office. 4. Call us if you have any worsening symptoms before then. 5. Call us if you have any questions or concerns   It was great to see you today!  I hope you have a wonderful summer!!    At [facility] we value your feedback. You may receive a survey about your visit today. Please share your experience as we strive to create trusting relationships with our patients to provide genuine, compassionate, quality care.

## 2017-09-12 NOTE — Assessment & Plan Note (Signed)
Symptoms mild and intermittent.  Antiemetics work well.  Continue current prescription medications.  Follow-up in 1 year.

## 2017-09-14 DIAGNOSIS — M25561 Pain in right knee: Secondary | ICD-10-CM | POA: Diagnosis not present

## 2017-09-14 DIAGNOSIS — M1711 Unilateral primary osteoarthritis, right knee: Secondary | ICD-10-CM | POA: Diagnosis not present

## 2017-10-24 ENCOUNTER — Ambulatory Visit (INDEPENDENT_AMBULATORY_CARE_PROVIDER_SITE_OTHER): Payer: Medicare HMO | Admitting: Cardiology

## 2017-10-24 ENCOUNTER — Other Ambulatory Visit: Payer: Self-pay

## 2017-10-24 ENCOUNTER — Encounter: Payer: Self-pay | Admitting: Cardiology

## 2017-10-24 VITALS — BP 131/83 | HR 66 | Ht 69.0 in | Wt 273.0 lb

## 2017-10-24 DIAGNOSIS — I10 Essential (primary) hypertension: Secondary | ICD-10-CM | POA: Diagnosis not present

## 2017-10-24 DIAGNOSIS — I5032 Chronic diastolic (congestive) heart failure: Secondary | ICD-10-CM | POA: Diagnosis not present

## 2017-10-24 DIAGNOSIS — E782 Mixed hyperlipidemia: Secondary | ICD-10-CM | POA: Diagnosis not present

## 2017-10-24 NOTE — Progress Notes (Signed)
Clinical Summary [name] [name] is a 77 y.o.male seen today for follow up of the following medical problems.   1. Chronic diastolic heart failure  - No recent edema.  - home weights stable 266-267.  - off diuretics due to prior issues with hypotension, exact history unclear. Since being off has not had recurrence of edema.   2. Hyperlipidemia -- patient unsure if taking statin. Was on his discharge summary from Jan 2019  3.HTN - home bp's stable 130s/80s  4. Syncope - admitted 08/2016 with syncope.  - orthostatic on admission  - no recent issues.    5. CVA - noted during recent admit 08/2016 by imaging - he is on plavix for secondary prevention (ASA allergy)  6. OSA - poor compliance with CPAP      Past Medical History:  Diagnosis Date  . Assistance needed for mobility    cane, walker  . Bilateral lower leg cellulitis   . BPH (benign prostatic hyperplasia)   . Cataract    left  . Chronic diastolic HF (heart failure) ([place])   . Chronic low back pain   . Collagen vascular disease ([place])    history of venous insufficency and LE cellulitis  . Complication of anesthesia    2012 due to sleep apnea pt has had difficulty being put under  and waking up from anesthesia  . Deep venous insufficiency   . GERD (gastroesophageal reflux disease)   . Glaucoma    left  . Headache   . Heel spur    right heel  . History of bladder infections   . History of colonic polyps   . Hyperlipidemia   . Hypertension    Sees Dr. Debara Pickett  . Hypothyroidism    hypothyroid denies no rx  . Iron deficiency 07/07/2016  . Kidney stones    passed them  . Obesity   . Osteoarthritis   . PONV (postoperative nausea and vomiting)   . PUD (peptic ulcer disease)   . Ringing in ears   . S/P colonoscopy 2007, Feb and March 2012   hx of adenomas, left-sided diverticula, On 07/2010 TCS, fresh blood and clot noted coming from TI.  . S/P endoscopy 2007, 2012   2007: nl, May 2012: antral  erosions  . SIRS (systemic inflammatory response syndrome) ([place]) 02/27/2012  . Sleep apnea    supposed to see Dr. Luan Pulling to get it started back, hasn't been on in since 2018  . Thrombocytopenia ([place]) 12/30/2011   Stable     Allergies  Allergen Reactions  . Aspirin Other (See Comments)    Nausea and upset stomach    . Nexium [Esomeprazole Magnesium] Other (See Comments)    Patient said it messed his stomach up     Current Outpatient Medications  Medication Sig Dispense Refill  . acetaminophen (TYLENOL) 325 MG tablet Take 2 tablets (650 mg total) by mouth every 6 (six) hours as needed for mild pain (or Fever >/= 101).    . bisacodyl (DULCOLAX) 5 MG EC tablet Take 1 tablet (5 mg total) by mouth daily. 30 tablet 0  . brimonidine (ALPHAGAN) 0.2 % ophthalmic solution Place 1 drop into both eyes 2 (two) times daily.    . Cholecalciferol (VITAMIN D3) 1000 units CAPS Take 1 capsule by mouth. Takes 3 times per week    . clopidogrel (PLAVIX) 75 MG tablet Take 1 tablet (75 mg total) by mouth daily. 90 tablet 3  . diclofenac sodium (VOLTAREN) 1 %  GEL Apply 4 g 4 (four) times daily as needed topically. 2 Tube 3  . dorzolamide-timolol (COSOPT) 22.3-6.8 MG/ML ophthalmic solution Place 1 drop into both eyes 2 (two) times daily.    . ferrous sulfate 325 (65 FE) MG tablet Take 325 mg by mouth daily with breakfast.    . latanoprost (XALATAN) 0.005 % ophthalmic solution Place 1 drop into both eyes at bedtime.    . Menthol, Topical Analgesic, (BIOFREEZE) 4 % GEL To be used by therapy dept as needed    . methocarbamol (ROBAXIN) 500 MG tablet Take 1 tablet (500 mg total) by mouth 2 (two) times daily. 20 tablet 0  . metoprolol tartrate (LOPRESSOR) 25 MG tablet Take 25 mg by mouth 2 (two) times daily.    . Oxycodone HCl 10 MG TABS Take 1 tablet (10 mg total) by mouth 2 (two) times daily as needed (pain). 60 tablet 0  . Polyethyl Glycol-Propyl Glycol (LUBRICANT EYE DROPS) 0.4-0.3 % SOLN Place 1 drop into both  eyes daily as needed (for lubricant eye drops).     . potassium chloride (K-DUR,KLOR-CON) 10 MEQ tablet Take 40 mEq by mouth 2 (two) times daily.      No current facility-administered medications for this visit.      Past Surgical History:  Procedure Laterality Date  . BACK SURGERY    . CARDIAC CATHETERIZATION  02/27/2007   no sign of CAD, LVH, mod pulm HTN (Dr. Jackie Plum(  . CATARACT EXTRACTION W/ INTRAOCULAR LENS IMPLANT Right ~ 2012  . COLONOSCOPY N/A 05/28/2014   RDE:[id number] colonic polyps/colonic diverticulosis. Tubular adenomas. Next colonoscopy January 2021  . ESOPHAGOGASTRODUODENOSCOPY  Aug 2013   [facility], single 50mm pedunculated polyp otherwise normal. HYPERPLASTIC, NEGATIVE h.pylori  . ESOPHAGOGASTRODUODENOSCOPY N/A 05/28/2014   RMR:non-critical Schatzki's ring/HH. Benign gastritis  . INGUINAL HERNIA REPAIR Right 1941  . KNEE ARTHROSCOPY Left ~ 2000  . LAPAROSCOPIC CHOLECYSTECTOMY    . [place] SURGERY  2012  . MALONEY DILATION N/A 05/28/2014   Procedure: Venia Minks DILATION;  Surgeon: Daneil Dolin, MD;  [facility];  Service: Endoscopy;  Laterality: N/A;  . NASAL SEPTOPLASTY W/ TURBINOPLASTY Bilateral 10/07/2015   Procedure: NASAL SEPTOPLASTY WITH TURBINATE REDUCTION;  Surgeon: Leta Baptist, MD;  Location: [place];  Service: ENT;  Laterality: Bilateral;  . NASAL SEPTUM SURGERY Bilateral 10/07/2015    inferior turbinate resection  . NM MYOCAR PERF WALL MOTION  12/2006   dipyridamole myoview - stress images show mild perfusion defect in mid inferior walls with reversibility at rest; mild perfusion defect in mid inferolateral wall at stress with mild defect reversibility, EF 62%, abnormal but low risk study   . POSTERIOR LUMBAR FUSION  2015  . SAVORY DILATION N/A 05/28/2014   Procedure: SAVORY DILATION;  Surgeon: Daneil Dolin, MD;  [facility];  Service: Endoscopy;  Laterality: N/A;  . SHOULDER OPEN ROTATOR CUFF REPAIR Left early 2000s  . small bowel capsule study   07/2011   ?transient focal ischemia in distal small bowel to explain GI bleeding  . TRANSTHORACIC ECHOCARDIOGRAM  09/2008   EF 60-65%, mod conc LVH  . TRANSURETHRAL RESECTION OF PROSTATE    . TRANSURETHRAL RESECTION OF PROSTATE N/A 06/18/2013   Procedure: TRANSURETHRAL RESECTION OF THE PROSTATE (TURP);  Surgeon: Marissa Nestle, MD;  [facility];  Service: Urology;  Laterality: N/A;     Allergies  Allergen Reactions  . Aspirin Other (See Comments)    Nausea and upset stomach    .  Nexium [Esomeprazole Magnesium] Other (See Comments)    Patient said it messed his stomach up      Family History  Problem Relation Age of Onset  . Colon cancer Father 66       deceased  . Prostate cancer Father   . Pancreatic cancer Mother 85       deceased  . Prostate cancer Brother   . Arthritis Son   . Arthritis Son   . Diabetes Mellitus II Son      Social History Mr. Kohlmeyer reports that he quit smoking about 44 years ago. His smoking use included cigars. He started smoking about 57 years ago. He quit after 14.00 years of use. He has never used smokeless tobacco. Mr. Arnesen reports that he does not drink alcohol.   Review of Systems CONSTITUTIONAL: No weight loss, fever, chills, weakness or fatigue.  HEENT: Eyes: No visual loss, blurred vision, double vision or yellow sclerae.No hearing loss, sneezing, congestion, runny nose or sore throat.  SKIN: No rash or itching.  CARDIOVASCULAR: per hpi RESPIRATORY: No shortness of breath, cough or sputum.  GASTROINTESTINAL: No anorexia, nausea, vomiting or diarrhea. No abdominal pain or blood.  GENITOURINARY: No burning on urination, no polyuria NEUROLOGICAL: No headache, dizziness, syncope, paralysis, ataxia, numbness or tingling in the extremities. No change in bowel or bladder control.  MUSCULOSKELETAL: No muscle, back pain, joint pain or stiffness.  LYMPHATICS: No enlarged nodes. No history of splenectomy.  PSYCHIATRIC: No history of  depression or anxiety.  ENDOCRINOLOGIC: No reports of sweating, cold or heat intolerance. No polyuria or polydipsia.  Marland Kitchen   Physical Examination Vitals:   10/24/17 1501  BP: 131/83  Pulse: 66  SpO2: 96%   Vitals:   10/24/17 1501  Weight: 273 lb (123.8 kg)  Height: 5\' 9"  (1.753 m)    Gen: resting comfortably, no acute distress HEENT: no scleral icterus, pupils equal round and reactive, no palptable cervical adenopathy,  CV: RRR, no m/r/g, no jvd Resp: Clear to auscultation bilaterally GI: abdomen is soft, non-tender, non-distended, normal bowel sounds, no hepatosplenomegaly MSK: extremities are warm, no edema.  Skin: warm, no rash Neuro:  no focal deficits Psych: appropriate affect   Diagnostic Studies     Assessment and Plan  1. Chronic diastolic HF -no recent issuess, he is actually off diuretics at this time -continue to monitor.   2. HTN - at goal, continue current meds  3. Hyperlipidemia - defer to pcp, its unlcear to me why off statin.      Received clearance request from neurosurgery, ok to proceed with procedure as planned. May hold plavix 5 days prior to procedure.   Carlyle Dolly MD

## 2017-10-24 NOTE — Patient Instructions (Signed)

## 2017-10-26 ENCOUNTER — Encounter: Payer: Self-pay | Admitting: *Deleted

## 2017-10-29 ENCOUNTER — Encounter: Payer: Self-pay | Admitting: Cardiology

## 2017-11-09 DIAGNOSIS — I252 Old myocardial infarction: Secondary | ICD-10-CM | POA: Diagnosis not present

## 2017-11-09 DIAGNOSIS — Z6841 Body Mass Index (BMI) 40.0 and over, adult: Secondary | ICD-10-CM | POA: Diagnosis not present

## 2017-11-09 DIAGNOSIS — G3184 Mild cognitive impairment, so stated: Secondary | ICD-10-CM | POA: Diagnosis not present

## 2017-11-09 DIAGNOSIS — H409 Unspecified glaucoma: Secondary | ICD-10-CM | POA: Diagnosis not present

## 2017-11-09 DIAGNOSIS — I110 Hypertensive heart disease with heart failure: Secondary | ICD-10-CM | POA: Diagnosis not present

## 2017-11-09 DIAGNOSIS — I509 Heart failure, unspecified: Secondary | ICD-10-CM | POA: Diagnosis not present

## 2017-11-09 DIAGNOSIS — G8929 Other chronic pain: Secondary | ICD-10-CM | POA: Diagnosis not present

## 2017-11-09 DIAGNOSIS — N400 Enlarged prostate without lower urinary tract symptoms: Secondary | ICD-10-CM | POA: Diagnosis not present

## 2017-11-09 DIAGNOSIS — K5900 Constipation, unspecified: Secondary | ICD-10-CM | POA: Diagnosis not present

## 2017-11-14 DIAGNOSIS — M47816 Spondylosis without myelopathy or radiculopathy, lumbar region: Secondary | ICD-10-CM | POA: Diagnosis not present

## 2017-11-14 DIAGNOSIS — Z6841 Body Mass Index (BMI) 40.0 and over, adult: Secondary | ICD-10-CM | POA: Diagnosis not present

## 2017-12-05 DIAGNOSIS — I739 Peripheral vascular disease, unspecified: Secondary | ICD-10-CM | POA: Diagnosis not present

## 2017-12-12 HISTORY — PX: BACK SURGERY: SHX140

## 2017-12-13 DIAGNOSIS — I10 Essential (primary) hypertension: Secondary | ICD-10-CM | POA: Diagnosis not present

## 2017-12-13 DIAGNOSIS — M47816 Spondylosis without myelopathy or radiculopathy, lumbar region: Secondary | ICD-10-CM | POA: Diagnosis not present

## 2017-12-13 DIAGNOSIS — Z6841 Body Mass Index (BMI) 40.0 and over, adult: Secondary | ICD-10-CM | POA: Diagnosis not present

## 2017-12-20 ENCOUNTER — Emergency Department (HOSPITAL_COMMUNITY)
Admission: EM | Admit: 2017-12-20 | Discharge: 2017-12-20 | Disposition: A | Discharge status: Home / Self Care | Payer: Medicare HMO | Attending: Emergency Medicine | Admitting: Emergency Medicine

## 2017-12-20 ENCOUNTER — Other Ambulatory Visit: Payer: Self-pay

## 2017-12-20 ENCOUNTER — Encounter (HOSPITAL_COMMUNITY): Payer: Self-pay

## 2017-12-20 ENCOUNTER — Encounter

## 2017-12-20 ENCOUNTER — Emergency Department (HOSPITAL_COMMUNITY): Payer: Medicare HMO

## 2017-12-20 ENCOUNTER — Ambulatory Visit: Payer: Medicare HMO | Admitting: Nurse Practitioner

## 2017-12-20 ENCOUNTER — Encounter: Payer: Self-pay | Admitting: Nurse Practitioner

## 2017-12-20 VITALS — BP 169/96 | HR 68 | Temp 96.3°F | Ht 66.0 in | Wt 270.4 lb

## 2017-12-20 DIAGNOSIS — E039 Hypothyroidism, unspecified: Secondary | ICD-10-CM | POA: Diagnosis not present

## 2017-12-20 DIAGNOSIS — Z87891 Personal history of nicotine dependence: Secondary | ICD-10-CM | POA: Insufficient documentation

## 2017-12-20 DIAGNOSIS — R1032 Left lower quadrant pain: Secondary | ICD-10-CM | POA: Diagnosis not present

## 2017-12-20 DIAGNOSIS — Z79899 Other long term (current) drug therapy: Secondary | ICD-10-CM | POA: Insufficient documentation

## 2017-12-20 DIAGNOSIS — R109 Unspecified abdominal pain: Secondary | ICD-10-CM | POA: Insufficient documentation

## 2017-12-20 DIAGNOSIS — R1013 Epigastric pain: Secondary | ICD-10-CM | POA: Diagnosis not present

## 2017-12-20 DIAGNOSIS — R110 Nausea: Secondary | ICD-10-CM

## 2017-12-20 DIAGNOSIS — I5032 Chronic diastolic (congestive) heart failure: Secondary | ICD-10-CM | POA: Diagnosis not present

## 2017-12-20 DIAGNOSIS — I110 Hypertensive heart disease with heart failure: Secondary | ICD-10-CM | POA: Diagnosis not present

## 2017-12-20 DIAGNOSIS — M545 Low back pain: Secondary | ICD-10-CM | POA: Diagnosis not present

## 2017-12-20 DIAGNOSIS — R5383 Other fatigue: Secondary | ICD-10-CM | POA: Diagnosis not present

## 2017-12-20 DIAGNOSIS — Z7901 Long term (current) use of anticoagulants: Secondary | ICD-10-CM | POA: Insufficient documentation

## 2017-12-20 LAB — URINALYSIS, ROUTINE W REFLEX MICROSCOPIC
Bilirubin Urine: NEGATIVE
Glucose, UA: NEGATIVE mg/dL
Hgb urine dipstick: NEGATIVE
Ketones, ur: NEGATIVE mg/dL
LEUKOCYTES UA: NEGATIVE
Nitrite: NEGATIVE
PROTEIN: NEGATIVE mg/dL
SPECIFIC GRAVITY, URINE: 1.01 (ref 1.005–1.030)
pH: 7 (ref 5.0–8.0)

## 2017-12-20 LAB — COMPREHENSIVE METABOLIC PANEL
ALK PHOS: 61 U/L (ref 38–126)
ALT: 8 U/L (ref 0–44)
ANION GAP: 5 (ref 5–15)
AST: 13 U/L — ABNORMAL LOW (ref 15–41)
Albumin: 3.6 g/dL (ref 3.5–5.0)
BUN: 12 mg/dL (ref 8–23)
CALCIUM: 9.3 mg/dL (ref 8.9–10.3)
CHLORIDE: 104 mmol/L (ref 98–111)
CO2: 30 mmol/L (ref 22–32)
CREATININE: 1.04 mg/dL (ref 0.61–1.24)
Glucose, Bld: 102 mg/dL — ABNORMAL HIGH (ref 70–99)
Potassium: 4.3 mmol/L (ref 3.5–5.1)
SODIUM: 139 mmol/L (ref 135–145)
Total Bilirubin: 1 mg/dL (ref 0.3–1.2)
Total Protein: 6.3 g/dL — ABNORMAL LOW (ref 6.5–8.1)

## 2017-12-20 LAB — CBC
HCT: 46.8 % (ref 39.0–52.0)
HEMOGLOBIN: 15.6 g/dL (ref 13.0–17.0)
MCH: 26.7 pg (ref 26.0–34.0)
MCHC: 33.3 g/dL (ref 30.0–36.0)
MCV: 80.1 fL (ref 78.0–100.0)
PLATELETS: 115 10*3/uL — AB (ref 150–400)
RBC: 5.84 MIL/uL — AB (ref 4.22–5.81)
RDW: 16.2 % — ABNORMAL HIGH (ref 11.5–15.5)
WBC: 11.8 10*3/uL — AB (ref 4.0–10.5)

## 2017-12-20 LAB — LIPASE, BLOOD: LIPASE: 39 U/L (ref 11–51)

## 2017-12-20 LAB — POC OCCULT BLOOD, ED: FECAL OCCULT BLD: NEGATIVE

## 2017-12-20 MED ORDER — IOPAMIDOL (ISOVUE-300) INJECTION 61%
100.0000 mL | Freq: Once | INTRAVENOUS | Status: AC | PRN
  Administered 2017-12-20: 100 mL via INTRAVENOUS

## 2017-12-20 MED ORDER — DIPHENHYDRAMINE HCL 50 MG/ML IJ SOLN
25.0000 mg | Freq: Once | INTRAMUSCULAR | Status: AC
  Administered 2017-12-20: 25 mg via INTRAVENOUS
  Filled 2017-12-20: qty 1

## 2017-12-20 MED ORDER — FAMOTIDINE IN NACL 20-0.9 MG/50ML-% IV SOLN
20.0000 mg | Freq: Once | INTRAVENOUS | Status: AC
  Administered 2017-12-20: 20 mg via INTRAVENOUS
  Filled 2017-12-20: qty 50

## 2017-12-20 MED ORDER — ONDANSETRON HCL 4 MG/2ML IJ SOLN
4.0000 mg | Freq: Once | INTRAMUSCULAR | Status: AC
  Administered 2017-12-20: 4 mg via INTRAVENOUS
  Filled 2017-12-20: qty 2

## 2017-12-20 MED ORDER — DEXAMETHASONE SODIUM PHOSPHATE 10 MG/ML IJ SOLN
10.0000 mg | Freq: Once | INTRAMUSCULAR | Status: AC
  Administered 2017-12-20: 10 mg via INTRAVENOUS
  Filled 2017-12-20: qty 1

## 2017-12-20 MED ORDER — HYDROCODONE-ACETAMINOPHEN 5-325 MG PO TABS: 1.0000 | ORAL_TABLET | Freq: Four times a day (QID) | ORAL | 0 refills | Status: DC | PRN

## 2017-12-20 MED ORDER — FENTANYL CITRATE (PF) 100 MCG/2ML IJ SOLN
50.0000 ug | Freq: Once | INTRAMUSCULAR | Status: AC
  Administered 2017-12-20: 50 ug via INTRAVENOUS
  Filled 2017-12-20: qty 2

## 2017-12-20 NOTE — ED Notes (Signed)
Patient brought back from CT, per CT tech, patient has hives from IV contrast received during CT scan. Patient has redness noted to forehead and upper chest area. Complaining of itching to head area. Denies shortness of breath or difficulty swallowing. NAD noted. Verbal order for medications from [facility] given. Patient states itching "is better."

## 2017-12-20 NOTE — ED Notes (Signed)
Gave patient meal tray.

## 2017-12-20 NOTE — ED Notes (Signed)
Gave patient diet ginger ale and ordered meal tray for patient as requested and approved by Lily Kocher PA.

## 2017-12-20 NOTE — Assessment & Plan Note (Signed)
He does have a history of nausea and typically Zofran works very well for him.  He has associated nausea with his significant abdominal pain (as per below).  Zofran is not helping him.  He states he wishes he could throw up.  At this point I will have him proceed to the emergency room per below.

## 2017-12-20 NOTE — Discharge Instructions (Signed)
Your lab work and CT scans are negative for acute problem. Your vital signs are non-acute. Please see your GI specialist for additional evaluation of the left abdominal pain as soon as possible. Please use norco for pain not improved by extra strength tylenol. Norco may cause constipation. Please increase fluids. Stool softener may be needed. Please return to the ED if any changes in your condition or problems.

## 2017-12-20 NOTE — Progress Notes (Signed)
cc'd to pcp 

## 2017-12-20 NOTE — ED Provider Notes (Addendum)
[facility] EMERGENCY DEPARTMENT Provider Note   CSN: [id number] Arrival date & time: 12/20/17  0901     History   Chief Complaint Chief Complaint  Patient presents with  . Abdominal Pain    HPI [name] [name]. is a 77 y.o. male.  Patient is a 77 year old male who presents to the emergency department with a complaint of left abdomen pain.  The patient states he has been having a sharp left abdomen pain as well as an epigastric pain over the last few days.  He was seen by Dr. Buford Dresser today for GI evaluation.  Dr. Buford Dresser asked him to come to the emergency department to have a work-up done here.  The patient states he has had some nausea, but no actual vomiting.  He has not noticed any changes in his stool recently.  Patient denies excessive use of aspirin or aspirin related products.  He has not had any recent operations or procedures involving his abdomen.  There is been no recent injury noted.  No high fevers reported.  Patient presents now for assistance with this issue.  The history is provided by the patient.    Past Medical History:  Diagnosis Date  . Assistance needed for mobility    cane, walker  . Bilateral lower leg cellulitis   . BPH (benign prostatic hyperplasia)   . Cataract    left  . Chronic diastolic HF (heart failure) ([place])   . Chronic low back pain   . Collagen vascular disease ([place])    history of venous insufficency and LE cellulitis  . Complication of anesthesia    2012 due to sleep apnea pt has had difficulty being put under  and waking up from anesthesia  . Deep venous insufficiency   . GERD (gastroesophageal reflux disease)   . Glaucoma    left  . Headache   . Heel spur    right heel  . History of bladder infections   . History of colonic polyps   . Hyperlipidemia   . Hypertension    Sees Dr. Debara Pickett  . Hypothyroidism    hypothyroid denies no rx  . Iron deficiency 07/07/2016  . Kidney stones    passed them  . Obesity   . Osteoarthritis     . PONV (postoperative nausea and vomiting)   . PUD (peptic ulcer disease)   . Ringing in ears   . S/P colonoscopy 2007, Feb and March 2012   hx of adenomas, left-sided diverticula, On 07/2010 TCS, fresh blood and clot noted coming from TI.  . S/P endoscopy 2007, 2012   2007: nl, May 2012: antral erosions  . SIRS (systemic inflammatory response syndrome) ([place]) 02/27/2012  . Sleep apnea    supposed to see Dr. Luan Pulling to get it started back, hasn't been on in since 2018  . Thrombocytopenia ([place]) 12/30/2011   Stable    Patient Active Problem List   Diagnosis Date Noted  . Abdominal pain 12/20/2017  . Diverticulitis of colon 06/28/2017  . Gastrointestinal hemorrhage with melena 05/27/2017  . Acute cystitis 05/27/2017  . Pelvic mass in male 05/27/2017  . Acute diverticulitis 05/27/2017  . Heme positive stool 05/27/2017  . Prostatitis 05/27/2017  . S/P colonoscopy 05/27/2017  . Bilateral primary osteoarthritis of knee 04/06/2017  . Weakness 02/27/2017  . Ventral hernia 02/27/2017  . Acute CVA (cerebrovascular accident) ([place]) 09/13/2016  . Stroke (cerebrum) ([place]) 09/13/2016  . Syncope 09/12/2016  . Iron deficiency 07/07/2016  . Back pain 03/22/2016  .  Acute right-sided low back pain with right-sided sciatica   . S/P nasal septoplasty 10/07/2015  . Parotitis, acute 03/06/2015  . Nausea without vomiting 12/08/2014  . Dysphagia, pharyngoesophageal phase   . History of colonic polyps   . Hypokalemia 09/01/2013  . Dehydration 09/01/2013  . Leukocytosis 09/01/2013  . Diastolic dysfunction by echo Dec 2014 07/30/2013  . Morbid obesity- BMI 44.5 07/30/2013  . Edema of both legs 07/30/2013  . Bilateral lower leg cellulitis 07/18/2013  . 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 OSA (obstructive sleep apnea) 11/27/2012  . Other spondylosis with radiculopathy, lumbar region 08/22/2012  . Bright red blood per rectum 06/15/2012  . Constipation 02/29/2012  . SIRS (systemic inflammatory  response syndrome) ([place]) 02/27/2012  . Hyperlipidemia 02/27/2012  . Thrombocytopenia ([place]) 12/30/2011  . Hypothyroidism 10/27/2011  . Upper abdominal pain 10/27/2011  . Rectal bleeding 11/16/2010  . DIARRHEA 06/08/2010  . COLONIC POLYPS, ADENOMATOUS, HX OF 07/16/2009  . Essential hypertension 06/19/2009  . GASTROESOPHAGEAL REFLUX DISEASE, CHRONIC 06/19/2009  . FATTY LIVER DISEASE 06/19/2009  . CHOLELITHIASIS, SYMPTOMATIC 06/19/2009  . ABDOMINAL BLOATING 06/19/2009    Past Surgical History:  Procedure Laterality Date  . BACK SURGERY    . BACK SURGERY  12/12/2017  . CARDIAC CATHETERIZATION  02/27/2007   no sign of CAD, LVH, mod pulm HTN (Dr. Jackie Plum(  . CATARACT EXTRACTION W/ INTRAOCULAR LENS IMPLANT Right ~ 2012  . COLONOSCOPY N/A 05/28/2014   ZOX:[id number] colonic polyps/colonic diverticulosis. Tubular adenomas. Next colonoscopy January 2021  . ESOPHAGOGASTRODUODENOSCOPY  Aug 2013   [facility], single 55mm pedunculated polyp otherwise normal. HYPERPLASTIC, NEGATIVE h.pylori  . ESOPHAGOGASTRODUODENOSCOPY N/A 05/28/2014   RMR:non-critical Schatzki's ring/HH. Benign gastritis  . INGUINAL HERNIA REPAIR Right 1941  . KNEE ARTHROSCOPY Left ~ 2000  . LAPAROSCOPIC CHOLECYSTECTOMY    . [place] SURGERY  2012  . MALONEY DILATION N/A 05/28/2014   Procedure: Venia Minks DILATION;  Surgeon: Daneil Dolin, MD;  Location: [facility];  Service: Endoscopy;  Laterality: N/A;  . NASAL SEPTOPLASTY W/ TURBINOPLASTY Bilateral 10/07/2015   Procedure: NASAL SEPTOPLASTY WITH TURBINATE REDUCTION;  Surgeon: Leta Baptist, MD;  Location: [place];  Service: ENT;  Laterality: Bilateral;  . NASAL SEPTUM SURGERY Bilateral 10/07/2015    inferior turbinate resection  . NM MYOCAR PERF WALL MOTION  12/2006   dipyridamole myoview - stress images show mild perfusion defect in mid inferior walls with reversibility at rest; mild perfusion defect in mid inferolateral wall at stress with mild defect reversibility, EF 62%, abnormal but low  risk study   . POSTERIOR LUMBAR FUSION  2015  . SAVORY DILATION N/A 05/28/2014   Procedure: SAVORY DILATION;  Surgeon: Daneil Dolin, MD;  Location: [facility];  Service: Endoscopy;  Laterality: N/A;  . SHOULDER OPEN ROTATOR CUFF REPAIR Left early 2000s  . small bowel capsule study  07/2011   ?transient focal ischemia in distal small bowel to explain GI bleeding  . TRANSTHORACIC ECHOCARDIOGRAM  09/2008   EF 60-65%, mod conc LVH  . TRANSURETHRAL RESECTION OF PROSTATE    . TRANSURETHRAL RESECTION OF PROSTATE N/A 06/18/2013   Procedure: TRANSURETHRAL RESECTION OF THE PROSTATE (TURP);  Surgeon: Marissa Nestle, MD;  Location: [facility];  Service: Urology;  Laterality: N/A;        Home Medications    Prior to Admission medications   Medication Sig Start Date End Date Taking? Authorizing Provider  acetaminophen (TYLENOL) 325 MG tablet Take 2 tablets (650 mg total) by  mouth every 6 (six) hours as needed for mild pain (or Fever >/= 101). 06/01/17   Debbe Odea, MD  bisacodyl (DULCOLAX) 5 MG EC tablet Take 1 tablet (5 mg total) by mouth daily. 03/26/16   Thurnell Lose, MD  brimonidine (ALPHAGAN) 0.2 % ophthalmic solution Place 1 drop into both eyes 2 (two) times daily.    [provider]  Cholecalciferol (VITAMIN D3) 1000 units CAPS Take 1 capsule by mouth. Takes 3 times per week    [provider]  clopidogrel (PLAVIX) 75 MG tablet Take 1 tablet (75 mg total) by mouth daily. 11/10/16   Lendon Colonel, NP  diclofenac sodium (VOLTAREN) 1 % GEL Apply 4 g 4 (four) times daily as needed topically. 04/06/17   McKeag, Marylynn Pearson, MD  dorzolamide-timolol (COSOPT) 22.3-6.8 MG/ML ophthalmic solution Place 1 drop into both eyes 2 (two) times daily. 12/17/14   [provider]  ferrous sulfate 325 (65 FE) MG tablet Take 325 mg by mouth daily with breakfast.    [provider]  Ginger, Zingiber officinalis, (GINGER ROOT PO) Take by mouth as needed.    [provider]  latanoprost (XALATAN) 0.005 % ophthalmic solution Place 1 drop into both eyes at bedtime.    [provider]  Menthol, Topical Analgesic, (BIOFREEZE) 4 % GEL To be used by therapy dept as needed    [provider]  metoprolol tartrate (LOPRESSOR) 25 MG tablet Take 25 mg by mouth 2 (two) times daily.    [provider]  ondansetron (ZOFRAN) 4 MG tablet Take 4 mg by mouth as needed for nausea or vomiting.    [provider]  Oxycodone HCl 10 MG TABS Take 1 tablet (10 mg total) by mouth 2 (two) times daily as needed (pain). 09/16/16   Reed, Tiffany L, DO  Polyethyl Glycol-Propyl Glycol (LUBRICANT EYE DROPS) 0.4-0.3 % SOLN Place 1 drop into both eyes daily as needed (for lubricant eye drops).     [provider]  potassium chloride (K-DUR,KLOR-CON) 10 MEQ tablet Take 40 mEq by mouth 2 (two) times daily.     [provider]  Probiotic Product (ALIGN PO) Take by mouth daily.    [provider]  sodium chloride (OCEAN) 0.65 % SOLN nasal spray Place 1 spray into both nostrils as needed for congestion.    [provider]  TURMERIC PO Take by mouth 2 (two) times daily.    [provider]    Family History Family History  Problem Relation Age of Onset  . Colon cancer Father 68       deceased  . Prostate cancer Father   . Pancreatic cancer Mother 53       deceased  . Prostate cancer Brother   . Arthritis Son   . Arthritis Son   . Diabetes Mellitus II Son     Social History Social History   Tobacco Use  . Smoking status: Former Smoker    Years: 14.00    Types: Cigars    Start date: 11/20/1959    Last attempt to quit: 08/10/1973    Years since quitting: 44.3  . Smokeless tobacco: Never Used  Substance Use Topics  . Alcohol use: No    Alcohol/week: 0.0 oz    Comment: denies  . Drug use: No     Allergies   Aspirin and Nexium [esomeprazole magnesium]   Review of Systems Review of Systems    Constitutional: Positive for fatigue. Negative for  activity change, chills and fever.       All ROS Neg except as noted in HPI  HENT: Negative for nosebleeds.   Eyes: Negative for photophobia and discharge.  Respiratory: Negative for cough, shortness of breath and wheezing.   Cardiovascular: Negative for chest pain, palpitations and leg swelling.  Gastrointestinal: Positive for abdominal pain. Negative for blood in stool, diarrhea and vomiting.  Genitourinary: Negative for dysuria, frequency and hematuria.  Musculoskeletal: Negative for arthralgias, back pain and neck pain.  Skin: Negative.   Neurological: Negative for dizziness, seizures and speech difficulty.  Psychiatric/Behavioral: Negative for confusion and hallucinations.     Physical Exam Updated Vital Signs BP 137/66 (BP Location: Right Arm)   Pulse 66   Temp 97.8 F (36.6 C) (Oral)   Resp 14   SpO2 95%   Physical Exam  Constitutional: He is oriented to person, place, and time. He appears well-developed and well-nourished.  Non-toxic appearance.  HENT:  Head: Normocephalic.  Right Ear: Tympanic membrane and external ear normal.  Left Ear: Tympanic membrane and external ear normal.  Eyes: Pupils are equal, round, and reactive to light. EOM and lids are normal.  Neck: Normal range of motion. Neck supple. Carotid bruit is not present.  Cardiovascular: Normal rate, regular rhythm, normal heart sounds, intact distal pulses and normal pulses.  Pulmonary/Chest: Breath sounds normal. No respiratory distress.  Abdominal: Soft. He exhibits no abdominal bruit, no pulsatile midline mass and no mass. Bowel sounds are increased. There is no splenomegaly or hepatomegaly. There is tenderness in the left upper quadrant and left lower quadrant. There is no rigidity and no guarding.    Musculoskeletal: Normal range of motion.  Lymphadenopathy:       Head (right side): No submandibular adenopathy present.       Head (left side): No  submandibular adenopathy present.    He has no cervical adenopathy.  Neurological: He is alert and oriented to person, place, and time. He has normal strength. No cranial nerve deficit or sensory deficit.  Skin: Skin is warm and dry.  Psychiatric: He has a normal mood and affect. His speech is normal.  Nursing note and vitals reviewed.    ED Treatments / Results  Labs (all labs ordered are listed, but only abnormal results are displayed) Labs Reviewed  LIPASE, BLOOD  COMPREHENSIVE METABOLIC PANEL  CBC  URINALYSIS, ROUTINE W REFLEX MICROSCOPIC    EKG None  Radiology No results found.  Procedures Procedures (including critical care time)  Medications Ordered in ED Medications - No data to display   Initial Impression / Assessment and Plan / ED Course  I have reviewed the triage vital signs and the nursing notes.  Pertinent labs & imaging results that were available during my care of the patient were reviewed by me and considered in my medical decision making (see chart for details).       Final Clinical Impressions(s) / ED Diagnoses MDM Vital signs reviewed. Pt tender to palpation in the left lower quad. Increase bowel sound present.  12:28 PM.  Patient returns from CT scan.  Apparently having reaction from the contrast dye with hives.  IV Benadryl and IV Pepcid ordered for the patient. Pt seen with me by Dr Vanita Panda.  Labs reviewed. No acute findings. CT  abd is negative for mass, or any other emergent changes. Pt ask to see GI specialist as soon as possible. Rx for norco to be use for pain not improved by Tylenol. Pt will return  to the ED if not improving.   Final diagnoses:  Left lower quadrant pain    ED Discharge Orders        Ordered    HYDROcodone-acetaminophen (NORCO/VICODIN) 5-325 MG tablet  Every 6 hours PRN     12/20/17 1428       Lily Kocher, PA-C 12/20/17 1442    Carmin Muskrat, MD 12/20/17 1508    Lily Kocher, PA-C 01/02/18  1646    Carmin Muskrat, MD 01/02/18 2359

## 2017-12-20 NOTE — Patient Instructions (Signed)
1. As we discussed, proceed to the emergency room. 2. Return for follow-up here in 3 months or sooner, depending on recommendations made by the emergency room. 3. Call us if you have any questions or concerns.  At [facility] we value your feedback. You may receive a survey about your visit today. Please share your experience as we strive to create trusting relationships with our patients to provide genuine, compassionate, quality care.   Feel Better Soon!!!!!!

## 2017-12-20 NOTE — Assessment & Plan Note (Signed)
The patient appears in significant pain.  He is holding his side and has difficulty standing straight up, difficulty walking.  He is exquisitely tender on even light palpation.  Possibly some guarding.  He does have left-sided diverticular disease.  He is not in his usual jovial mood today.  He is complaining of 10 out of 10 pain, and is not usually complaining of pain much.  On concerned about any acute process such as diverticulitis.  At this point given the significant pain he is then I will send him to the emergency department for a more rapid work-up including imaging, labs.  Follow-up based on ER recommendations in 3 months.

## 2017-12-20 NOTE — Progress Notes (Signed)
Referring Provider: Redmond School, MD Primary Care Physician:  Redmond School, MD Primary GI:  Dr. Gala Romney  Chief Complaint  Patient presents with  . Abdominal Pain    left  . Nausea    HPI:   [name] Tout. is a 77 y.o. male who presents for abdominal pain, constipation, gas.  The patient was last seen in our office 09/12/2017 for diverticulitis, nausea, thrombocytopenia.  History of questionable cecal diverticulitis.  A previous CT found no focal liver finding but he did have mild splenomegaly "as previously seen" as well as a depressed platelet count around 103.  At his last visit he was having mild intermittent nausea which was relieved by Zofran.  Having persistent abdominal pain (occasional gas pains).  No other GI symptoms.  Denies excessive bruising or bleeding.  Due for colonoscopy in 2021.  Commended follow-up in 1 year.  Continue to see oncology and nephrology.  Today he states he's not doing well. Started having LLQ abdominal pain about 5 days ago, has gotten even worse since then. Almost went to the ER this morning. Pain described as dull and crampy; radiates/also in the left mid-abdomen. Pain is 10/10 right now. Nausea as well, not helped by Zofran at this point; no vomiting. Has stopped milk all together, eating baked foods. Having a bowel movement daily to every other day but pain is not improving. Does have some incomplete emptying, but went before his appointment today and emptied completely; pain persists. Denies hematochezia, melena, fever, chills, unintentional weight loss. Denies chest pain, dyspnea, dizziness, lightheadedness, syncope, near syncope. Denies any other upper or lower GI symptoms.  Past Medical History:  Diagnosis Date  . Assistance needed for mobility    cane, walker  . Bilateral lower leg cellulitis   . BPH (benign prostatic hyperplasia)   . Cataract    left  . Chronic diastolic HF (heart failure) ([place])   . Chronic low back pain   .  Collagen vascular disease ([place])    history of venous insufficency and LE cellulitis  . Complication of anesthesia    2012 due to sleep apnea pt has had difficulty being put under  and waking up from anesthesia  . Deep venous insufficiency   . GERD (gastroesophageal reflux disease)   . Glaucoma    left  . Headache   . Heel spur    right heel  . History of bladder infections   . History of colonic polyps   . Hyperlipidemia   . Hypertension    Sees Dr. Debara Pickett  . Hypothyroidism    hypothyroid denies no rx  . Iron deficiency 07/07/2016  . Kidney stones    passed them  . Obesity   . Osteoarthritis   . PONV (postoperative nausea and vomiting)   . PUD (peptic ulcer disease)   . Ringing in ears   . S/P colonoscopy 2007, Feb and March 2012   hx of adenomas, left-sided diverticula, On 07/2010 TCS, fresh blood and clot noted coming from TI.  . S/P endoscopy 2007, 2012   2007: nl, May 2012: antral erosions  . SIRS (systemic inflammatory response syndrome) ([place]) 02/27/2012  . Sleep apnea    supposed to see Dr. Luan Pulling to get it started back, hasn't been on in since 2018  . Thrombocytopenia ([place]) 12/30/2011   Stable    Past Surgical History:  Procedure Laterality Date  . BACK SURGERY    . BACK SURGERY  12/12/2017  . CARDIAC CATHETERIZATION  02/27/2007   no sign of CAD, LVH, mod pulm HTN (Dr. Jackie Plum(  . CATARACT EXTRACTION W/ INTRAOCULAR LENS IMPLANT Right ~ 2012  . COLONOSCOPY N/A 05/28/2014   UDJ:SHFWYOVZ colonic polyps/colonic diverticulosis. Tubular adenomas. Next colonoscopy January 2021  . ESOPHAGOGASTRODUODENOSCOPY  Aug 2013   [facility], single 16mm pedunculated polyp otherwise normal. HYPERPLASTIC, NEGATIVE h.pylori  . ESOPHAGOGASTRODUODENOSCOPY N/A 05/28/2014   RMR:non-critical Schatzki's ring/HH. Benign gastritis  . INGUINAL HERNIA REPAIR Right 1941  . KNEE ARTHROSCOPY Left ~ 2000  . LAPAROSCOPIC CHOLECYSTECTOMY    . [place] SURGERY  2012  . MALONEY DILATION N/A 05/28/2014    Procedure: Venia Minks DILATION;  Surgeon: Daneil Dolin, MD;  [facility];  Service: Endoscopy;  Laterality: N/A;  . NASAL SEPTOPLASTY W/ TURBINOPLASTY Bilateral 10/07/2015   Procedure: NASAL SEPTOPLASTY WITH TURBINATE REDUCTION;  Surgeon: Leta Baptist, MD;  Location: [facility];  Service: ENT;  Laterality: Bilateral;  . NASAL SEPTUM SURGERY Bilateral 10/07/2015    inferior turbinate resection  . NM MYOCAR PERF WALL MOTION  12/2006   dipyridamole myoview - stress images show mild perfusion defect in mid inferior walls with reversibility at rest; mild perfusion defect in mid inferolateral wall at stress with mild defect reversibility, EF 62%, abnormal but low risk study   . POSTERIOR LUMBAR FUSION  2015  . SAVORY DILATION N/A 05/28/2014   Procedure: SAVORY DILATION;  Surgeon: Daneil Dolin, MD;  Location: [facility];  Service: Endoscopy;  Laterality: N/A;  . SHOULDER OPEN ROTATOR CUFF REPAIR Left early 2000s  . small bowel capsule study  07/2011   ?transient focal ischemia in distal small bowel to explain GI bleeding  . TRANSTHORACIC ECHOCARDIOGRAM  09/2008   EF 60-65%, mod conc LVH  . TRANSURETHRAL RESECTION OF PROSTATE    . TRANSURETHRAL RESECTION OF PROSTATE N/A 06/18/2013   Procedure: TRANSURETHRAL RESECTION OF THE PROSTATE (TURP);  Surgeon: Marissa Nestle, MD;  Location: [facility];  Service: Urology;  Laterality: N/A;    Current Outpatient Medications  Medication Sig Dispense Refill  . acetaminophen (TYLENOL) 325 MG tablet Take 2 tablets (650 mg total) by mouth every 6 (six) hours as needed for mild pain (or Fever >/= 101).    . bisacodyl (DULCOLAX) 5 MG EC tablet Take 1 tablet (5 mg total) by mouth daily. 30 tablet 0  . brimonidine (ALPHAGAN) 0.2 % ophthalmic solution Place 1 drop into both eyes 2 (two) times daily.    . Cholecalciferol (VITAMIN D3) 1000 units CAPS Take 1 capsule by mouth. Takes 3 times per week    . clopidogrel (PLAVIX) 75 MG tablet Take 1 tablet (75 mg total) by mouth  daily. 90 tablet 3  . diclofenac sodium (VOLTAREN) 1 % GEL Apply 4 g 4 (four) times daily as needed topically. 2 Tube 3  . dorzolamide-timolol (COSOPT) 22.3-6.8 MG/ML ophthalmic solution Place 1 drop into both eyes 2 (two) times daily.    . ferrous sulfate 325 (65 FE) MG tablet Take 325 mg by mouth daily with breakfast.    . Ginger, Zingiber officinalis, (GINGER ROOT PO) Take by mouth as needed.    . latanoprost (XALATAN) 0.005 % ophthalmic solution Place 1 drop into both eyes at bedtime.    . Menthol, Topical Analgesic, (BIOFREEZE) 4 % GEL To be used by therapy dept as needed    . metoprolol tartrate (LOPRESSOR) 25 MG tablet Take 25 mg by mouth 2 (two) times daily.    . ondansetron (ZOFRAN) 4 MG tablet  Take 4 mg by mouth as needed for nausea or vomiting.    . Oxycodone HCl 10 MG TABS Take 1 tablet (10 mg total) by mouth 2 (two) times daily as needed (pain). 60 tablet 0  . Polyethyl Glycol-Propyl Glycol (LUBRICANT EYE DROPS) 0.4-0.3 % SOLN Place 1 drop into both eyes daily as needed (for lubricant eye drops).     . potassium chloride (K-DUR,KLOR-CON) 10 MEQ tablet Take 40 mEq by mouth 2 (two) times daily.     . Probiotic Product (ALIGN PO) Take by mouth daily.    . sodium chloride (OCEAN) 0.65 % SOLN nasal spray Place 1 spray into both nostrils as needed for congestion.    . TURMERIC PO Take by mouth 2 (two) times daily.     No current facility-administered medications for this visit.     Allergies as of 12/20/2017 - Review Complete 12/20/2017  Allergen Reaction Noted  . Aspirin Other (See Comments)   . Nexium [esomeprazole magnesium] Other (See Comments) 11/14/2013    Family History  Problem Relation Age of Onset  . Colon cancer Father 81       deceased  . Prostate cancer Father   . Pancreatic cancer Mother 57       deceased  . Prostate cancer Brother   . Arthritis Son   . Arthritis Son   . Diabetes Mellitus II Son     Social History   Socioeconomic History  . Marital  status: Widowed    Spouse name: Roger Kill  . Number of children: 5  . Years of education: College  . Highest education level: Not on file  Occupational History  . Occupation: disability    Employer: RETIRED  Social Needs  . Financial resource strain: Not on file  . Food insecurity:    Worry: Not on file    Inability: Not on file  . Transportation needs:    Medical: Not on file    Non-medical: Not on file  Tobacco Use  . Smoking status: Former Smoker    Years: 14.00    Types: Cigars    Start date: 11/20/1959    Last attempt to quit: 08/10/1973    Years since quitting: 44.3  . Smokeless tobacco: Never Used  Substance and Sexual Activity  . Alcohol use: No    Alcohol/week: 0.0 oz    Comment: denies  . Drug use: No  . Sexual activity: Never    Partners: Male    Birth control/protection: None  Lifestyle  . Physical activity:    Days per week: Not on file    Minutes per session: Not on file  . Stress: Not on file  Relationships  . Social connections:    Talks on phone: Not on file    Gets together: Not on file    Attends religious service: Not on file    Active member of club or organization: Not on file    Attends meetings of clubs or organizations: Not on file    Relationship status: Not on file  Other Topics Concern  . Not on file  Social History Narrative   Patient lives at home with spouse and son.   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     Review of Systems: General: Negative for anorexia, weight loss, fever, chills, fatigue, weakness. ENT: Negative for hoarseness, difficulty swallowing. CV: Negative for chest pain, angina, palpitations, peripheral edema.  Respiratory: Negative for dyspnea at rest, cough, sputum,  wheezing.  GI: See history of present illness. MS: Chronic back pain s/p recent nerve procedure in [place].  Derm: Negative for rash or itching.  Endo: Negative for unusual weight change.  Heme: Negative for  bruising or bleeding. Allergy: Negative for rash or hives.   Physical Exam: BP (!) 169/96   Pulse 68   Temp (!) 96.3 F (35.7 C) (Oral)   Ht 5\' 6"  (1.676 m)   Wt 270 lb 6.4 oz (122.7 kg)   BMI 43.64 kg/m  General:   Alert and oriented. Pleasant and cooperative. Well-nourished and well-developed. Appears in significant pain. Eyes:  Without icterus, sclera clear and conjunctiva pink.  Ears:  Normal auditory acuity. Cardiovascular:  S1, S2 present without murmurs appreciated. Extremities without clubbing or edema. Respiratory:  Clear to auscultation bilaterally. No wheezes, rales, or rhonchi. No distress.  Gastrointestinal:  +BS, soft. Abdomen appears somewhat distended (difficult to assess fully due to pain and body habitus). Significantly tender with standing, sitting, light palpation.  Rectal:  Deferred  Skin:  Intact without significant lesions or rashes. Neurologic:  Alert and oriented x4;  grossly normal neurologically. Psych:  Alert and cooperative. Appears in significant pain. Heme/Lymph/Immune: No excessive bruising noted.    12/20/2017 8:31 AM   Disclaimer: This note was dictated with voice recognition software. Similar sounding words can inadvertently be transcribed and may not be corrected upon review.

## 2017-12-20 NOTE — ED Triage Notes (Signed)
Pt stated he went to see Dr. Gala Romney today and states he sent paperwork as to what needs to be done. Is having left sided abdominal pain as well as epigastric pain. Is sharp pain. Has not taken any pain medications today.

## 2017-12-21 ENCOUNTER — Telehealth: Payer: Self-pay | Admitting: Internal Medicine

## 2017-12-21 NOTE — Telephone Encounter (Signed)
Spoke with pt. Pt wants EG to review ED notes from yesterday 12/20/17. Pt says they think he needs an EGD. Please advise.

## 2017-12-21 NOTE — Telephone Encounter (Signed)
Pt called saying that he went to the ER yesterday and needed to have "a face to face" with EG ASAP. I told him that I would put a phone note for either EG or the nurse to call him back. [phone]

## 2017-12-25 ENCOUNTER — Telehealth: Payer: Self-pay | Admitting: Internal Medicine

## 2017-12-25 NOTE — Telephone Encounter (Signed)
Spoke with pt. He states that he has constant pain and feels he can't wait to see RMR  If it's needed. Pt is nauseated all day, even with taking nausea medication and doesn't have much of an appetite. Pt eats enough to take his medicine. Pt is aware that EG has pts this morning and a previous message was sent.

## 2017-12-25 NOTE — Telephone Encounter (Signed)
Pt called again this morning to see what EG recommends. Per EG patient should follow up with RMR. I offerd patient OV with RMR for 8/27 but patient said he can't take much more and can't wait that long. Please advise [phone]

## 2017-12-27 NOTE — Telephone Encounter (Signed)
Pt called this morning to see what the next steps are reguarding his treatment plan. Pt says he is suffering and has the same symptoms. Please advise.

## 2017-12-27 NOTE — Telephone Encounter (Signed)
Spoke with pt and he is aware of his apt on 01/16/18 with RMR. Pt will make apt.

## 2017-12-31 ENCOUNTER — Emergency Department (HOSPITAL_COMMUNITY): Payer: Medicare HMO

## 2017-12-31 ENCOUNTER — Other Ambulatory Visit: Payer: Self-pay

## 2017-12-31 ENCOUNTER — Encounter (HOSPITAL_COMMUNITY): Payer: Self-pay | Admitting: *Deleted

## 2017-12-31 ENCOUNTER — Emergency Department (HOSPITAL_COMMUNITY)
Admission: EM | Admit: 2017-12-31 | Discharge: 2017-12-31 | Disposition: A | Discharge status: Home / Self Care | Payer: Medicare HMO | Attending: Emergency Medicine | Admitting: Emergency Medicine

## 2017-12-31 DIAGNOSIS — R0602 Shortness of breath: Secondary | ICD-10-CM | POA: Diagnosis not present

## 2017-12-31 DIAGNOSIS — R05 Cough: Secondary | ICD-10-CM

## 2017-12-31 DIAGNOSIS — R059 Cough, unspecified: Secondary | ICD-10-CM

## 2017-12-31 DIAGNOSIS — E039 Hypothyroidism, unspecified: Secondary | ICD-10-CM | POA: Diagnosis not present

## 2017-12-31 DIAGNOSIS — R079 Chest pain, unspecified: Secondary | ICD-10-CM | POA: Insufficient documentation

## 2017-12-31 DIAGNOSIS — Z87891 Personal history of nicotine dependence: Secondary | ICD-10-CM | POA: Diagnosis not present

## 2017-12-31 DIAGNOSIS — R1013 Epigastric pain: Secondary | ICD-10-CM | POA: Insufficient documentation

## 2017-12-31 DIAGNOSIS — J019 Acute sinusitis, unspecified: Secondary | ICD-10-CM | POA: Diagnosis not present

## 2017-12-31 DIAGNOSIS — I110 Hypertensive heart disease with heart failure: Secondary | ICD-10-CM | POA: Diagnosis not present

## 2017-12-31 DIAGNOSIS — I5032 Chronic diastolic (congestive) heart failure: Secondary | ICD-10-CM | POA: Diagnosis not present

## 2017-12-31 DIAGNOSIS — Z7902 Long term (current) use of antithrombotics/antiplatelets: Secondary | ICD-10-CM | POA: Insufficient documentation

## 2017-12-31 DIAGNOSIS — Z79899 Other long term (current) drug therapy: Secondary | ICD-10-CM | POA: Diagnosis not present

## 2017-12-31 DIAGNOSIS — R0789 Other chest pain: Secondary | ICD-10-CM | POA: Diagnosis not present

## 2017-12-31 MED ORDER — ALBUTEROL SULFATE HFA 108 (90 BASE) MCG/ACT IN AERS
2.0000 | INHALATION_SPRAY | Freq: Once | RESPIRATORY_TRACT | Status: AC
  Administered 2017-12-31: 2 via RESPIRATORY_TRACT
  Filled 2017-12-31: qty 6.7

## 2017-12-31 MED ORDER — HYDROCODONE-ACETAMINOPHEN 5-325 MG PO TABS
1.0000 | ORAL_TABLET | Freq: Once | ORAL | Status: AC
  Administered 2017-12-31: 1 via ORAL
  Filled 2017-12-31: qty 1

## 2017-12-31 MED ORDER — AMOXICILLIN-POT CLAVULANATE 875-125 MG PO TABS: 1.0000 | ORAL_TABLET | Freq: Two times a day (BID) | ORAL | 0 refills | Status: DC

## 2017-12-31 NOTE — ED Provider Notes (Signed)
[facility] EMERGENCY DEPARTMENT Provider Note   CSN: [id number] Arrival date & time: 12/31/17  0510     History   Chief Complaint Chief Complaint  Patient presents with  . Cough    HPI [name] [name]. is a 77 y.o. male.  The history is provided by the patient.  Cough  This is a new problem. The current episode started more than 2 days ago. The problem occurs every few minutes. The problem has been gradually worsening. The cough is non-productive. Associated symptoms include chest pain, chills, rhinorrhea, sore throat and shortness of breath. He is not a smoker.  Patient with a history of morbid obesity, chronic heart failure presents with cough.  He reports the past several days has been having nonproductive cough, chills, shortness of breath, chest pain that is triggered by cough.  He also reports some mild epigastric pain from coughing.  He also reports he feels that he may have a sinus infection.  No hemoptysis reported.  He is a non-smoker.  No recorded fever at home.  Past Medical History:  Diagnosis Date  . Assistance needed for mobility    cane, walker  . Bilateral lower leg cellulitis   . BPH (benign prostatic hyperplasia)   . Cataract    left  . Chronic diastolic HF (heart failure) ([place])   . Chronic low back pain   . Collagen vascular disease ([place])    history of venous insufficency and LE cellulitis  . Complication of anesthesia    2012 due to sleep apnea pt has had difficulty being put under  and waking up from anesthesia  . Deep venous insufficiency   . GERD (gastroesophageal reflux disease)   . Glaucoma    left  . Headache   . Heel spur    right heel  . History of bladder infections   . History of colonic polyps   . Hyperlipidemia   . Hypertension    Sees Dr. Debara Pickett  . Hypothyroidism    hypothyroid denies no rx  . Iron deficiency 07/07/2016  . Kidney stones    passed them  . Obesity   . Osteoarthritis   . PONV (postoperative nausea and  vomiting)   . PUD (peptic ulcer disease)   . Ringing in ears   . S/P colonoscopy 2007, Feb and March 2012   hx of adenomas, left-sided diverticula, On 07/2010 TCS, fresh blood and clot noted coming from TI.  . S/P endoscopy 2007, 2012   2007: nl, May 2012: antral erosions  . SIRS (systemic inflammatory response syndrome) ([place]) 02/27/2012  . Sleep apnea    supposed to see Dr. Luan Pulling to get it started back, hasn't been on in since 2018  . Thrombocytopenia ([place]) 12/30/2011   Stable    Patient Active Problem List   Diagnosis Date Noted  . Abdominal pain 12/20/2017  . Diverticulitis of colon 06/28/2017  . Gastrointestinal hemorrhage with melena 05/27/2017  . Acute cystitis 05/27/2017  . Pelvic mass in male 05/27/2017  . Acute diverticulitis 05/27/2017  . Heme positive stool 05/27/2017  . Prostatitis 05/27/2017  . S/P colonoscopy 05/27/2017  . Bilateral primary osteoarthritis of knee 04/06/2017  . Weakness 02/27/2017  . Ventral hernia 02/27/2017  . Acute CVA (cerebrovascular accident) ([place]) 09/13/2016  . 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 Syncope 09/12/2016  . Iron deficiency 07/07/2016  . Back pain 03/22/2016  . Acute right-sided low back pain with right-sided sciatica   . S/P nasal septoplasty 10/07/2015  .  Parotitis, acute 03/06/2015  . Nausea without vomiting 12/08/2014  . Dysphagia, pharyngoesophageal phase   . History of colonic polyps   . Hypokalemia 09/01/2013  . Dehydration 09/01/2013  . Leukocytosis 09/01/2013  . Diastolic dysfunction by echo Dec 2014 07/30/2013  . Morbid obesity- BMI 44.5 07/30/2013  . Edema of both legs 07/30/2013  . Bilateral lower leg cellulitis 07/18/2013  . 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 OSA (obstructive sleep apnea) 11/27/2012  . Other spondylosis with radiculopathy, lumbar region 08/22/2012  . Bright red blood per rectum 06/15/2012  . Constipation 02/29/2012  . SIRS (systemic inflammatory response syndrome) ([place])  02/27/2012  . Hyperlipidemia 02/27/2012  . Thrombocytopenia ([place]) 12/30/2011  . Hypothyroidism 10/27/2011  . Upper abdominal pain 10/27/2011  . Rectal bleeding 11/16/2010  . DIARRHEA 06/08/2010  . COLONIC POLYPS, ADENOMATOUS, HX OF 07/16/2009  . Essential hypertension 06/19/2009  . GASTROESOPHAGEAL REFLUX DISEASE, CHRONIC 06/19/2009  . FATTY LIVER DISEASE 06/19/2009  . CHOLELITHIASIS, SYMPTOMATIC 06/19/2009  . ABDOMINAL BLOATING 06/19/2009    Past Surgical History:  Procedure Laterality Date  . BACK SURGERY    . BACK SURGERY  12/12/2017  . CARDIAC CATHETERIZATION  02/27/2007   no sign of CAD, LVH, mod pulm HTN (Dr. Jackie Plum(  . CATARACT EXTRACTION W/ INTRAOCULAR LENS IMPLANT Right ~ 2012  . COLONOSCOPY N/A 05/28/2014   JKK:[id number] colonic polyps/colonic diverticulosis. Tubular adenomas. Next colonoscopy January 2021  . ESOPHAGOGASTRODUODENOSCOPY  Aug 2013   [facility], single 45mm pedunculated polyp otherwise normal. HYPERPLASTIC, NEGATIVE h.pylori  . ESOPHAGOGASTRODUODENOSCOPY N/A 05/28/2014   RMR:non-critical Schatzki's ring/HH. Benign gastritis  . INGUINAL HERNIA REPAIR Right 1941  . KNEE ARTHROSCOPY Left ~ 2000  . LAPAROSCOPIC CHOLECYSTECTOMY    . [place] SURGERY  2012  . MALONEY DILATION N/A 05/28/2014   Procedure: Venia Minks DILATION;  Surgeon: Daneil Dolin, MD;  Location: [facility];  Service: Endoscopy;  Laterality: N/A;  . NASAL SEPTOPLASTY W/ TURBINOPLASTY Bilateral 10/07/2015   Procedure: NASAL SEPTOPLASTY WITH TURBINATE REDUCTION;  Surgeon: Leta Baptist, MD;  Location: [place];  Service: ENT;  Laterality: Bilateral;  . NASAL SEPTUM SURGERY Bilateral 10/07/2015    inferior turbinate resection  . NM MYOCAR PERF WALL MOTION  12/2006   dipyridamole myoview - stress images show mild perfusion defect in mid inferior walls with reversibility at rest; mild perfusion defect in mid inferolateral wall at stress with mild defect reversibility, EF 62%, abnormal but low risk study   . POSTERIOR  LUMBAR FUSION  2015  . SAVORY DILATION N/A 05/28/2014   Procedure: SAVORY DILATION;  Surgeon: Daneil Dolin, MD;  Location: [facility];  Service: Endoscopy;  Laterality: N/A;  . SHOULDER OPEN ROTATOR CUFF REPAIR Left early 2000s  . small bowel capsule study  07/2011   ?transient focal ischemia in distal small bowel to explain GI bleeding  . TRANSTHORACIC ECHOCARDIOGRAM  09/2008   EF 60-65%, mod conc LVH  . TRANSURETHRAL RESECTION OF PROSTATE    . TRANSURETHRAL RESECTION OF PROSTATE N/A 06/18/2013   Procedure: TRANSURETHRAL RESECTION OF THE PROSTATE (TURP);  Surgeon: Marissa Nestle, MD;  Location: [facility] OR[facility];  Service: Urology;  Laterality: N/A;        Home Medications    Prior to Admission medications   Medication Sig Start Date End Date Taking? Authorizing Provider  acetaminophen (TYLENOL) 325 MG tablet Take 2 tablets (650 mg total) by mouth every 6 (six) hours as needed for mild pain (or Fever >/= 101). 06/01/17  Debbe Odea, MD  bisacodyl (DULCOLAX) 5 MG EC tablet Take 1 tablet (5 mg total) by mouth daily. 03/26/16   Thurnell Lose, MD  brimonidine (ALPHAGAN) 0.2 % ophthalmic solution Place 1 drop into both eyes 2 (two) times daily.    [provider]  Cholecalciferol (VITAMIN D3) 1000 units CAPS Take 1 capsule by mouth. Takes 3 times per week    [provider]  clopidogrel (PLAVIX) 75 MG tablet Take 1 tablet (75 mg total) by mouth daily. 11/10/16   Lendon Colonel, NP  diclofenac sodium (VOLTAREN) 1 % GEL Apply 4 g 4 (four) times daily as needed topically. 04/06/17   McKeag, Marylynn Pearson, MD  dorzolamide-timolol (COSOPT) 22.3-6.8 MG/ML ophthalmic solution Place 1 drop into both eyes 2 (two) times daily. 12/17/14   [provider]  ferrous sulfate 325 (65 FE) MG tablet Take 325 mg by mouth daily with breakfast.    [provider]  Ginger, Zingiber officinalis, (GINGER ROOT PO) Take by mouth as needed.    [provider]    HYDROcodone-acetaminophen (NORCO/VICODIN) 5-325 MG tablet Take 1-2 tablets by mouth every 6 (six) hours as needed. 12/20/17   Lily Kocher, PA-C  latanoprost (XALATAN) 0.005 % ophthalmic solution Place 1 drop into both eyes at bedtime.    [provider]  Menthol, Topical Analgesic, (BIOFREEZE) 4 % GEL To be used by therapy dept as needed    [provider]  metoprolol tartrate (LOPRESSOR) 25 MG tablet Take 25 mg by mouth 2 (two) times daily.    [provider]  ondansetron (ZOFRAN) 4 MG tablet Take 4 mg by mouth as needed for nausea or vomiting.    [provider]  Oxycodone HCl 10 MG TABS Take 1 tablet (10 mg total) by mouth 2 (two) times daily as needed (pain). 09/16/16   Reed, Tiffany L, DO  oxyCODONE-acetaminophen (PERCOCET) 10-325 MG tablet Take 1 tablet by mouth every 4 (four) hours as needed. 11/30/17   [provider]  Polyethyl Glycol-Propyl Glycol (LUBRICANT EYE DROPS) 0.4-0.3 % SOLN Place 1 drop into both eyes daily as needed (for lubricant eye drops).     [provider]  potassium chloride (K-DUR,KLOR-CON) 10 MEQ tablet Take 40 mEq by mouth 2 (two) times daily.     [provider]  sodium chloride (OCEAN) 0.65 % SOLN nasal spray Place 1 spray into both nostrils as needed for congestion.    [provider]  TURMERIC PO Take by mouth 2 (two) times daily.    [provider]    Family History Family History  Problem Relation Age of Onset  . Colon cancer Father 5       deceased  . Prostate cancer Father   . Pancreatic cancer Mother 12       deceased  . Prostate cancer Brother   . Arthritis Son   . Arthritis Son   . Diabetes Mellitus II Son     Social History Social History   Tobacco Use  . Smoking status: Former Smoker    Years: 14.00    Types: Cigars    Start date: 11/20/1959    Last attempt to quit: 08/10/1973    Years since quitting: 44.4  . Smokeless tobacco: Never Used  Substance Use  Topics  . Alcohol use: No    Alcohol/week: 0.0 standard drinks    Comment: denies  . Drug use: No     Allergies   Aspirin; Iodinated diagnostic agents; and  Nexium [esomeprazole magnesium]   Review of Systems Review of Systems  Constitutional: Positive for chills.  HENT: Positive for rhinorrhea and sore throat.   Respiratory: Positive for cough and shortness of breath.   Cardiovascular: Positive for chest pain.  Gastrointestinal: Positive for abdominal pain.  All other systems reviewed and are negative.    Physical Exam Updated Vital Signs BP (!) 156/84   Pulse 87   Temp 98.4 F (36.9 C) (Oral)   Resp (!) 22   Ht 1.676 m (5\' 6" )   Wt 122.7 kg   SpO2 95%   BMI 43.66 kg/m   Physical Exam CONSTITUTIONAL: Chronically ill-appearing, no acute distress HEAD: Normocephalic/atraumatic EYES: EOMI/PERRL ENMT: Mucous membranes moist, mild erythema but no exudates and uvula midline NECK: supple no meningeal signs SPINE/BACK:entire spine nontender CV: S1/S2 noted, no murmurs/rubs/gallops noted LUNGS: Lungs are clear to auscultation bilaterally, no apparent distress Chest-diffuse central left-sided chest wall tenderness ABDOMEN: soft, nontender, no rebound or guarding, bowel sounds noted throughout abdomen, obese GU:no cva tenderness NEURO: Pt is awake/alert/appropriate, moves all extremitiesx4.  No facial droop.   EXTREMITIES: pulses normal/equal, full ROM SKIN: warm, color normal PSYCH: no abnormalities of mood noted, alert and oriented to situation   ED Treatments / Results  Labs (all labs ordered are listed, but only abnormal results are displayed) Labs Reviewed - No data to display  EKG EKG Interpretation  Date/Time:  Sunday December 31 2017 05:28:02 EDT Ventricular Rate:  82 PR Interval:    QRS Duration: 84 QT Interval:  361 QTC Calculation: 422 R Axis:   -37 Text Interpretation:  Sinus rhythm Borderline prolonged PR interval Left axis deviation Interpretation  limited secondary to artifact Confirmed by Ripley Fraise [phone]) on 12/31/2017 5:46:25 AM   Radiology Dg Chest 2 View  Result Date: 12/31/2017 CLINICAL DATA:  77 y/o M; nonproductive cough, chills, shortness of breath, chest pain, abdominal pain, headache for the past few days. EXAM: CHEST - 2 VIEW COMPARISON:  05/30/2017 chest radiograph. FINDINGS: Normal cardiac silhouette. Aortic atherosclerosis with calcification. Mild bronchitic changes. Linear opacities at the lung bases likely represents atelectasis. No consolidation. No pleural effusion or pneumothorax. No acute osseous abnormality is evident. IMPRESSION: Mild bronchitic changes. Minor bibasilar atelectasis. No consolidation. Aortic atherosclerosis. Electronically Signed   By: Kristine Garbe M.D.   On: 12/31/2017 06:26    Procedures Procedures  Medications Ordered in ED Medications  albuterol (PROVENTIL HFA;VENTOLIN HFA) 108 (90 Base) MCG/ACT inhaler 2 puff (2 puffs Inhalation Given 12/31/17 0552)  HYDROcodone-acetaminophen (NORCO/VICODIN) 5-325 MG per tablet 1 tablet (1 tablet Oral Given 12/31/17 0541)     Initial Impression / Assessment and Plan / ED Course  I have reviewed the triage vital signs and the nursing notes.  Pertinent  imaging results that were available during my care of the patient were reviewed by me and considered in my medical decision making (see chart for details).     5:56 AM Patient reports cough for several days, will proceed with chest x-ray.  He is in no distress, no hypoxia. Suspect all other symptoms including chest wall pain is due to recent coughing 6:57 AM Chest x-ray findings are reassuring.  Overall well-appearing. Patient is in no distress.  Will treat for cough and acute sinusitis.  Discussed need for follow-up PCP.  Discussed strict return precautions. Final Clinical Impressions(s) / ED Diagnoses   Final diagnoses:  Cough  Acute non-recurrent sinusitis, unspecified location     ED Discharge Orders    None  Ripley Fraise, MD 12/31/17 [phone]

## 2017-12-31 NOTE — Discharge Instructions (Addendum)
Please return to the ER if you start feeling more out of breath, worsening chest pain, cough up any blood over the next 24 to 48 hours

## 2017-12-31 NOTE — ED Triage Notes (Addendum)
Pt c/o cough that is non productive, chills, sob, chest pain, abd pain, headache for the past few days, was recently seen in er for abd pain.

## 2017-12-31 NOTE — ED Notes (Signed)
ED Provider at bedside. 

## 2017-12-31 NOTE — ED Notes (Signed)
Pt states that the pain has decreased everywhere but his head which remains the same,

## 2017-12-31 NOTE — Progress Notes (Signed)
Patient inspiratory flow checked. Flow adequate for effective medication administration. Patient educated on spacer use with inhaler.

## 2017-12-31 NOTE — ED Notes (Signed)
Pt returned from xray,  

## 2018-01-02 DIAGNOSIS — E782 Mixed hyperlipidemia: Secondary | ICD-10-CM | POA: Diagnosis not present

## 2018-01-02 DIAGNOSIS — J209 Acute bronchitis, unspecified: Secondary | ICD-10-CM | POA: Diagnosis not present

## 2018-01-02 DIAGNOSIS — J329 Chronic sinusitis, unspecified: Secondary | ICD-10-CM | POA: Diagnosis not present

## 2018-01-02 DIAGNOSIS — Z1389 Encounter for screening for other disorder: Secondary | ICD-10-CM | POA: Diagnosis not present

## 2018-01-02 DIAGNOSIS — Z0001 Encounter for general adult medical examination with abnormal findings: Secondary | ICD-10-CM | POA: Diagnosis not present

## 2018-01-02 DIAGNOSIS — I10 Essential (primary) hypertension: Secondary | ICD-10-CM | POA: Diagnosis not present

## 2018-01-02 DIAGNOSIS — Z6841 Body Mass Index (BMI) 40.0 and over, adult: Secondary | ICD-10-CM | POA: Diagnosis not present

## 2018-01-02 DIAGNOSIS — R201 Hypoesthesia of skin: Secondary | ICD-10-CM | POA: Diagnosis not present

## 2018-01-02 DIAGNOSIS — K219 Gastro-esophageal reflux disease without esophagitis: Secondary | ICD-10-CM | POA: Diagnosis not present

## 2018-01-02 DIAGNOSIS — E063 Autoimmune thyroiditis: Secondary | ICD-10-CM | POA: Diagnosis not present

## 2018-01-03 ENCOUNTER — Telehealth: Payer: Self-pay | Admitting: Internal Medicine

## 2018-01-03 DIAGNOSIS — Z1389 Encounter for screening for other disorder: Secondary | ICD-10-CM | POA: Diagnosis not present

## 2018-01-03 DIAGNOSIS — Z0001 Encounter for general adult medical examination with abnormal findings: Secondary | ICD-10-CM | POA: Diagnosis not present

## 2018-01-03 DIAGNOSIS — Z6841 Body Mass Index (BMI) 40.0 and over, adult: Secondary | ICD-10-CM | POA: Diagnosis not present

## 2018-01-03 DIAGNOSIS — E119 Type 2 diabetes mellitus without complications: Secondary | ICD-10-CM | POA: Diagnosis not present

## 2018-01-03 NOTE — Telephone Encounter (Signed)
Please call patient around one pm   He is having a lot of problems.  [phone]

## 2018-01-04 NOTE — Telephone Encounter (Signed)
Spoke with pt. Pt is going to call back with the name of medications he is taking for nausea. ? Zofran. Pt isn't able to read the writing clearly on the bottle. Pt is calling back with the name of medications, so we can triage pt correctly.

## 2018-01-04 NOTE — Telephone Encounter (Signed)
Pt returned call. He is taking Zofran q 6 hours and feels nauseated all the time. Pt also has c/o of upper stomach pain, feeling gassy and bloated. Pt is eating a bland diet. Pt was taking Zofran after he takes all of his other medications. Advised pt to try taking the Zofran 30 mins before his other medications to give it time to work prior to add his other medications. He isn't having regular bowel movements. On Tuesday, it looked like coffee grinds and yesterday a very small amount came out.

## 2018-01-09 NOTE — Telephone Encounter (Signed)
Spoke with pt. Pt is going to try Dexilant samples when they are available. Amitiza 24 mcg will be tired since pt is on pain medication and is having some constipation. Will discuss IFOBT instructions when pt picks samples up.

## 2018-01-09 NOTE — Telephone Encounter (Signed)
Have him pick up samples of Dexilant. Take one a day 30 minutes before a meal. When I saw him last his abdominal pain was generalized/lower. Now he states upper abdominal pain.   Please have him collect an iFOBT with c/o "coffee ground" stools  He is on pain medications (apparently from the ER) and likely having some OIC. If he's still constipated and taking pain medication he can pick up Amitiza 24 mcg samples (once pill twice daily after eating).  If he's not on pain medication anymore, and still constipated, can try MiraLAX 1-2 times a day as needed for constipation.  Please keep upcoming appointment due to volume and complexity of messages.

## 2018-01-16 ENCOUNTER — Ambulatory Visit (INDEPENDENT_AMBULATORY_CARE_PROVIDER_SITE_OTHER): Payer: Medicare HMO | Admitting: Nurse Practitioner

## 2018-01-16 ENCOUNTER — Encounter: Payer: Self-pay | Admitting: Nurse Practitioner

## 2018-01-16 ENCOUNTER — Other Ambulatory Visit: Payer: Self-pay

## 2018-01-16 ENCOUNTER — Encounter: Payer: Self-pay | Admitting: Internal Medicine

## 2018-01-16 ENCOUNTER — Ambulatory Visit: Payer: Medicare HMO | Admitting: Internal Medicine

## 2018-01-16 VITALS — BP 149/78 | HR 75 | Temp 96.6°F | Ht 67.0 in | Wt 272.6 lb

## 2018-01-16 DIAGNOSIS — R1013 Epigastric pain: Secondary | ICD-10-CM | POA: Diagnosis not present

## 2018-01-16 DIAGNOSIS — I739 Peripheral vascular disease, unspecified: Secondary | ICD-10-CM | POA: Diagnosis not present

## 2018-01-16 DIAGNOSIS — R195 Other fecal abnormalities: Secondary | ICD-10-CM

## 2018-01-16 DIAGNOSIS — R1032 Left lower quadrant pain: Secondary | ICD-10-CM | POA: Diagnosis not present

## 2018-01-16 DIAGNOSIS — K5909 Other constipation: Secondary | ICD-10-CM

## 2018-01-16 LAB — IFOBT (OCCULT BLOOD): IFOBT: NEGATIVE

## 2018-01-16 MED ORDER — PANTOPRAZOLE SODIUM 40 MG PO TBEC: 40.0000 mg | DELAYED_RELEASE_TABLET | Freq: Every day | ORAL | 0 refills | Status: DC

## 2018-01-16 NOTE — Patient Instructions (Addendum)
Stop Amitiza; begin Linzess 72 - one gelcap daily for constipation - samples x 3 weeks  Trial of protonix 40 mg daily x 3 weeks (disp 21) no refills  Schedule  EGD (epigastric pain - propofol) ASAP  Further recommendations to follow

## 2018-01-16 NOTE — Progress Notes (Signed)
Primary Care Physician:  Redmond School, MD Primary Gastroenterologist:  Dr. Gala Romney  Pre-Procedure History & Physical: HPI:  [name] [name]. is a 77 y.o. male with multiple medical problems here for further evaluation epigastric/ left lower quadrant abdominal pain. Has had symptoms for months. ED visit on at least 2 occasions. CT scan failed to demonstrate a cause for symptoms. He complains of ongoing constipation. Amitiza was too strong. Produced diarrhea. Complains of epigastric pain and burning. Gallbladder is out.  History of GERD but no acid suppression therapy recently. He is been waiting on samples of Dexilant. States even after BM -  no improvement in his left lower quadrant abdominal pain. Distant history of multiple colonic polyps removed. Patient states he no longer uses CPAP. He has sleep apnea. He does not sleep well at night. Denies dysphagia, melena or rectal bleeding. No significant vascular disease noted on recent CT scan.  I FOBT recently negative.  Past Medical History:  Diagnosis Date  . Assistance needed for mobility    cane, walker  . Bilateral lower leg cellulitis   . BPH (benign prostatic hyperplasia)   . Cataract    left  . Chronic diastolic HF (heart failure) ([place])   . Chronic low back pain   . Collagen vascular disease ([place])    history of venous insufficency and LE cellulitis  . Complication of anesthesia    2012 due to sleep apnea pt has had difficulty being put under  and waking up from anesthesia  . Deep venous insufficiency   . GERD (gastroesophageal reflux disease)   . Glaucoma    left  . Headache   . Heel spur    right heel  . History of bladder infections   . History of colonic polyps   . Hyperlipidemia   . Hypertension    Sees Dr. Debara Pickett  . Hypothyroidism    hypothyroid denies no rx  . Iron deficiency 07/07/2016  . Kidney stones    passed them  . Obesity   . Osteoarthritis   . PONV (postoperative nausea and vomiting)   . PUD  (peptic ulcer disease)   . Ringing in ears   . S/P colonoscopy 2007, Feb and March 2012   hx of adenomas, left-sided diverticula, On 07/2010 TCS, fresh blood and clot noted coming from TI.  . S/P endoscopy 2007, 2012   2007: nl, May 2012: antral erosions  . SIRS (systemic inflammatory response syndrome) ([place]) 02/27/2012  . Sleep apnea    supposed to see Dr. Luan Pulling to get it started back, hasn't been on in since 2018  . Thrombocytopenia ([place]) 12/30/2011   Stable    Past Surgical History:  Procedure Laterality Date  . BACK SURGERY    . BACK SURGERY  12/12/2017  . CARDIAC CATHETERIZATION  02/27/2007   no sign of CAD, LVH, mod pulm HTN (Dr. Jackie Plum(  . CATARACT EXTRACTION W/ INTRAOCULAR LENS IMPLANT Right ~ 2012  . COLONOSCOPY N/A 05/28/2014   [id number] colonic polyps/colonic diverticulosis. Tubular adenomas. Next colonoscopy January 2021  . ESOPHAGOGASTRODUODENOSCOPY  Aug 2013   [facility], single 81mm pedunculated polyp otherwise normal. HYPERPLASTIC, NEGATIVE h.pylori  . ESOPHAGOGASTRODUODENOSCOPY N/A 05/28/2014   RMR:non-critical Schatzki's ring/HH. Benign gastritis  . INGUINAL HERNIA REPAIR Right 1941  . KNEE ARTHROSCOPY Left ~ 2000  . LAPAROSCOPIC CHOLECYSTECTOMY    . [place] SURGERY  2012  . MALONEY DILATION N/A 05/28/2014   Procedure: Venia Minks DILATION;  Surgeon: Daneil Dolin, MD;  [facility];  Service: Endoscopy;  Laterality: N/A;  . NASAL SEPTOPLASTY W/ TURBINOPLASTY Bilateral 10/07/2015   Procedure: NASAL SEPTOPLASTY WITH TURBINATE REDUCTION;  Surgeon: Leta Baptist, MD;  Location: [facility];  Service: ENT;  Laterality: Bilateral;  . NASAL SEPTUM SURGERY Bilateral 10/07/2015    inferior turbinate resection  . NM MYOCAR PERF WALL MOTION  12/2006   dipyridamole myoview - stress images show mild perfusion defect in mid inferior walls with reversibility at rest; mild perfusion defect in mid inferolateral wall at stress with mild defect reversibility, EF 62%, abnormal but low risk  study   . POSTERIOR LUMBAR FUSION  2015  . SAVORY DILATION N/A 05/28/2014   Procedure: SAVORY DILATION;  Surgeon: Daneil Dolin, MD;  [facility];  Service: Endoscopy;  Laterality: N/A;  . SHOULDER OPEN ROTATOR CUFF REPAIR Left early 2000s  . small bowel capsule study  07/2011   ?transient focal ischemia in distal small bowel to explain GI bleeding  . TRANSTHORACIC ECHOCARDIOGRAM  09/2008   EF 60-65%, mod conc LVH  . TRANSURETHRAL RESECTION OF PROSTATE    . TRANSURETHRAL RESECTION OF PROSTATE N/A 06/18/2013   Procedure: TRANSURETHRAL RESECTION OF THE PROSTATE (TURP);  Surgeon: Marissa Nestle, MD;  [facility]: [facility];  Service: Urology;  Laterality: N/A;    Prior to Admission medications   Medication Sig Start Date End Date Taking? Authorizing Provider  acetaminophen (TYLENOL) 325 MG tablet Take 2 tablets (650 mg total) by mouth every 6 (six) hours as needed for mild pain (or Fever >/= 101). 06/01/17  Yes Debbe Odea, MD  brimonidine (ALPHAGAN) 0.2 % ophthalmic solution Place 1 drop into both eyes 2 (two) times daily.   Yes [provider]  Cholecalciferol (VITAMIN D3) 1000 units CAPS Take 1 capsule by mouth. Takes 3 times per week   Yes [provider]  clopidogrel (PLAVIX) 75 MG tablet Take 1 tablet (75 mg total) by mouth daily. 11/10/16  Yes Lendon Colonel, NP  diclofenac sodium (VOLTAREN) 1 % GEL Apply 4 g 4 (four) times daily as needed topically. 04/06/17  Yes McKeag, Marylynn Pearson, MD  dorzolamide-timolol (COSOPT) 22.3-6.8 MG/ML ophthalmic solution Place 1 drop into both eyes 2 (two) times daily. 12/17/14  Yes [provider]  ferrous sulfate 325 (65 FE) MG tablet Take 325 mg by mouth daily with breakfast.   Yes [provider]  Ginger, Zingiber officinalis, (GINGER ROOT PO) Take by mouth as needed.   Yes [provider]  latanoprost (XALATAN) 0.005 % ophthalmic solution Place 1 drop into both eyes at bedtime.   Yes [provider]  Menthol, Topical Analgesic, (BIOFREEZE) 4 % GEL To be used by therapy dept as needed   Yes [provider]  metoprolol tartrate (LOPRESSOR) 25 MG tablet Take 25 mg by mouth 2 (two) times daily.   Yes [provider]  ondansetron (ZOFRAN) 4 MG tablet Take 4 mg by mouth as needed for nausea or vomiting.   Yes [provider]  oxyCODONE-acetaminophen (PERCOCET) 10-325 MG tablet Take 1 tablet by mouth every 4 (four) hours as needed. 11/30/17  Yes [provider]  Polyethyl Glycol-Propyl Glycol (LUBRICANT EYE DROPS) 0.4-0.3 % SOLN Place 1 drop into both eyes daily as needed (for lubricant eye drops).    Yes [provider]  potassium chloride (K-DUR,KLOR-CON) 10 MEQ tablet Take 40 mEq by mouth 2 (two) times daily.    Yes [provider]  sodium chloride (OCEAN) 0.65 %  SOLN nasal spray Place 1 spray into both nostrils as needed for congestion.   Yes [provider]  TURMERIC PO Take by mouth 2 (two) times daily.   Yes [provider]  amoxicillin-clavulanate (AUGMENTIN) 875-125 MG tablet Take 1 tablet by mouth 2 (two) times daily. One po bid x 7 days Patient not taking: Reported on 01/16/2018 12/31/17   Ripley Fraise, MD  bisacodyl (DULCOLAX) 5 MG EC tablet Take 1 tablet (5 mg total) by mouth daily. Patient not taking: Reported on 01/16/2018 03/26/16   Thurnell Lose, MD  HYDROcodone-acetaminophen (NORCO/VICODIN) 5-325 MG tablet Take 1-2 tablets by mouth every 6 (six) hours as needed. Patient not taking: Reported on 01/16/2018 12/20/17   Lily Kocher, PA-C  Oxycodone HCl 10 MG TABS Take 1 tablet (10 mg total) by mouth 2 (two) times daily as needed (pain). Patient not taking: Reported on 01/16/2018 09/16/16   Hollace Kinnier L, DO    Allergies as of 01/16/2018 - Review Complete 01/16/2018  Allergen Reaction Noted  . Aspirin Other (See Comments)   . Iodinated diagnostic agents Hives 12/20/2017  . Nexium [esomeprazole magnesium]  Other (See Comments) 11/14/2013    Family History  Problem Relation Age of Onset  . Colon cancer Father 62       deceased  . Prostate cancer Father   . Pancreatic cancer Mother 8       deceased  . Prostate cancer Brother   . Arthritis Son   . Arthritis Son   . Diabetes Mellitus II Son     Social History   Socioeconomic History  . Marital status: Widowed    Spouse name: Roger Kill  . Number of children: 5  . Years of education: College  . Highest education level: Not on file  Occupational History  . Occupation: disability    Employer: RETIRED  Social Needs  . Financial resource strain: Not on file  . Food insecurity:    Worry: Not on file    Inability: Not on file  . Transportation needs:    Medical: Not on file    Non-medical: Not on file  Tobacco Use  . Smoking status: Former Smoker    Years: 14.00    Types: Cigars    Start date: 11/20/1959    Last attempt to quit: 08/10/1973    Years since quitting: 44.4  . Smokeless tobacco: Never Used  Substance and Sexual Activity  . Alcohol use: No    Alcohol/week: 0.0 standard drinks    Comment: denies  . Drug use: No  . Sexual activity: Never    Partners: Male    Birth control/protection: None  Lifestyle  . Physical activity:    Days per week: Not on file    Minutes per session: Not on file  . Stress: Not on file  Relationships  . Social connections:    Talks on phone: Not on file    Gets together: Not on file    Attends religious service: Not on file    Active member of club or organization: Not on file    Attends meetings of clubs or organizations: Not on file    Relationship status: Not on file  . Intimate partner violence:    Fear of current or ex partner: Not on file    Emotionally abused: Not on file    Physically abused: Not on file    Forced sexual activity: Not on file  Other Topics Concern  . Not on file  Social  History Narrative   Patient lives at home with spouse and son.   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     Review of Systems: See HPI, otherwise negative ROS  Physical Exam: BP (!) 149/78   Pulse 75   Temp (!) 96.6 F (35.9 C) (Oral)   Ht 5\' 7"  (1.702 m)   Wt 272 lb 9.6 oz (123.7 kg)   BMI 42.70 kg/m  General:   Alert, obese pleasant and cooperative in NAD  Lungs:  Clear throughout to auscultation.   No wheezes, crackles, or rhonchi. No acute distress. Heart:  Regular rate and rhythm; no murmurs, clicks, rubs,  or gallops. Abdomen: significantly obese. Positive bowel sounds. No bruits. Minimal epigastric and left lower quadrant tenderness to palpation over the last organomegaly.  No bruits. Pulses:  Normal pulses noted. Extremities:  Without clubbing or edema.  Impression/Plan:  Patient is a 77 year old gentleman with multiple comorbidities complaining somewhat bitterly of left lower quadrant and epigastric pain. History of GERD  - currently on no acid suppression therapy. He denies dysphagia. Left lower quadrant abdominal pain not particularly related to bowel function. Certainly, he has a slant towards constipation. Amitiza was too strong. Recent CT and lab work reassuring. I doubt we are dealing with mesenteric ischemia. No obvious surgical process. At least part of his symptoms may be related to unbriddled GERD. Would like to see his bowel function stabilized/regulated and observe left lower quadrant abdominal pain. Further evaluation of epigastric pain warranted. Again, gallbladder out.  Recommendations:   Stop Amitiza; begin Linzess 72 - one gelcap daily for constipation - samples x 3 weeks  Trial of protonix 40 mg daily x 3 weeks (disp 21) no refills.  He has taken before and has tolerated.  Schedule  EGD (epigastric pain - propofol) ASAP. I have offered patient a diagnostic EGD to further evaluate his epigastric pain.  The risks, benefits, limitations, alternatives and imponderables have been reviewed with the patient.  Potential for esophageal dilation, biopsy, etc. have also been reviewed.  Questions have been answered. All parties agreeable.  Further recommendations to follow      Notice: This dictation was prepared with Dragon dictation along with smaller phrase technology. Any transcriptional errors that result from this process are unintentional and may not be corrected upon review.

## 2018-01-16 NOTE — H&P (View-Only) (Signed)
Primary Care Physician:  Redmond School, MD Primary Gastroenterologist:  Dr. Gala Romney  Pre-Procedure History & Physical: HPI:  [name] [name]. is a 77 y.o. male with multiple medical problems here for further evaluation epigastric/ left lower quadrant abdominal pain. Has had symptoms for months. ED visit on at least 2 occasions. CT scan failed to demonstrate a cause for symptoms. He complains of ongoing constipation. Amitiza was too strong. Produced diarrhea. Complains of epigastric pain and burning. Gallbladder is out.  History of GERD but no acid suppression therapy recently. He is been waiting on samples of Dexilant. States even after BM -  no improvement in his left lower quadrant abdominal pain. Distant history of multiple colonic polyps removed. Patient states he no longer uses CPAP. He has sleep apnea. He does not sleep well at night. Denies dysphagia, melena or rectal bleeding. No significant vascular disease noted on recent CT scan.  I FOBT recently negative.  Past Medical History:  Diagnosis Date  . Assistance needed for mobility    cane, walker  . Bilateral lower leg cellulitis   . BPH (benign prostatic hyperplasia)   . Cataract    left  . Chronic diastolic HF (heart failure) ([place])   . Chronic low back pain   . Collagen vascular disease ([place])    history of venous insufficency and LE cellulitis  . Complication of anesthesia    2012 due to sleep apnea pt has had difficulty being put under  and waking up from anesthesia  . Deep venous insufficiency   . GERD (gastroesophageal reflux disease)   . Glaucoma    left  . Headache   . Heel spur    right heel  . History of bladder infections   . History of colonic polyps   . Hyperlipidemia   . Hypertension    Sees Dr. Debara Pickett  . Hypothyroidism    hypothyroid denies no rx  . Iron deficiency 07/07/2016  . Kidney stones    passed them  . Obesity   . Osteoarthritis   . PONV (postoperative nausea and vomiting)   . PUD  (peptic ulcer disease)   . Ringing in ears   . S/P colonoscopy 2007, Feb and March 2012   hx of adenomas, left-sided diverticula, On 07/2010 TCS, fresh blood and clot noted coming from TI.  . S/P endoscopy 2007, 2012   2007: nl, May 2012: antral erosions  . SIRS (systemic inflammatory response syndrome) ([place]) 02/27/2012  . Sleep apnea    supposed to see Dr. Luan Pulling to get it started back, hasn't been on in since 2018  . Thrombocytopenia ([place]) 12/30/2011   Stable    Past Surgical History:  Procedure Laterality Date  . BACK SURGERY    . BACK SURGERY  12/12/2017  . CARDIAC CATHETERIZATION  02/27/2007   no sign of CAD, LVH, mod pulm HTN (Dr. Jackie Plum(  . CATARACT EXTRACTION W/ INTRAOCULAR LENS IMPLANT Right ~ 2012  . COLONOSCOPY N/A 05/28/2014   [id number] colonic polyps/colonic diverticulosis. Tubular adenomas. Next colonoscopy January 2021  . ESOPHAGOGASTRODUODENOSCOPY  Aug 2013   [facility], single 52mm pedunculated polyp otherwise normal. HYPERPLASTIC, NEGATIVE h.pylori  . ESOPHAGOGASTRODUODENOSCOPY N/A 05/28/2014   RMR:non-critical Schatzki's ring/HH. Benign gastritis  . INGUINAL HERNIA REPAIR Right 1941  . KNEE ARTHROSCOPY Left ~ 2000  . LAPAROSCOPIC CHOLECYSTECTOMY    . [place] SURGERY  2012  . MALONEY DILATION N/A 05/28/2014   Procedure: Venia Minks DILATION;  Surgeon: Daneil Dolin, MD;  [facility];  Service: Endoscopy;  Laterality: N/A;  . NASAL SEPTOPLASTY W/ TURBINOPLASTY Bilateral 10/07/2015   Procedure: NASAL SEPTOPLASTY WITH TURBINATE REDUCTION;  Surgeon: Leta Baptist, MD;  Location: [facility];  Service: ENT;  Laterality: Bilateral;  . NASAL SEPTUM SURGERY Bilateral 10/07/2015    inferior turbinate resection  . NM MYOCAR PERF WALL MOTION  12/2006   dipyridamole myoview - stress images show mild perfusion defect in mid inferior walls with reversibility at rest; mild perfusion defect in mid inferolateral wall at stress with mild defect reversibility, EF 62%, abnormal but low risk  study   . POSTERIOR LUMBAR FUSION  2015  . SAVORY DILATION N/A 05/28/2014   Procedure: SAVORY DILATION;  Surgeon: Daneil Dolin, MD;  [facility];  Service: Endoscopy;  Laterality: N/A;  . SHOULDER OPEN ROTATOR CUFF REPAIR Left early 2000s  . small bowel capsule study  07/2011   ?transient focal ischemia in distal small bowel to explain GI bleeding  . TRANSTHORACIC ECHOCARDIOGRAM  09/2008   EF 60-65%, mod conc LVH  . TRANSURETHRAL RESECTION OF PROSTATE    . TRANSURETHRAL RESECTION OF PROSTATE N/A 06/18/2013   Procedure: TRANSURETHRAL RESECTION OF THE PROSTATE (TURP);  Surgeon: Marissa Nestle, MD;  [facility];  Service: Urology;  Laterality: N/A;    Prior to Admission medications   Medication Sig Start Date End Date Taking? Authorizing Provider  acetaminophen (TYLENOL) 325 MG tablet Take 2 tablets (650 mg total) by mouth every 6 (six) hours as needed for mild pain (or Fever >/= 101). 06/01/17  Yes Debbe Odea, MD  brimonidine (ALPHAGAN) 0.2 % ophthalmic solution Place 1 drop into both eyes 2 (two) times daily.   Yes [provider]  Cholecalciferol (VITAMIN D3) 1000 units CAPS Take 1 capsule by mouth. Takes 3 times per week   Yes [provider]  clopidogrel (PLAVIX) 75 MG tablet Take 1 tablet (75 mg total) by mouth daily. 11/10/16  Yes Lendon Colonel, NP  diclofenac sodium (VOLTAREN) 1 % GEL Apply 4 g 4 (four) times daily as needed topically. 04/06/17  Yes McKeag, Marylynn Pearson, MD  dorzolamide-timolol (COSOPT) 22.3-6.8 MG/ML ophthalmic solution Place 1 drop into both eyes 2 (two) times daily. 12/17/14  Yes [provider]  ferrous sulfate 325 (65 FE) MG tablet Take 325 mg by mouth daily with breakfast.   Yes [provider]  Ginger, Zingiber officinalis, (GINGER ROOT PO) Take by mouth as needed.   Yes [provider]  latanoprost (XALATAN) 0.005 % ophthalmic solution Place 1 drop into both eyes at bedtime.   Yes [provider]  Menthol, Topical Analgesic, (BIOFREEZE) 4 % GEL To be used by therapy dept as needed   Yes [provider]  metoprolol tartrate (LOPRESSOR) 25 MG tablet Take 25 mg by mouth 2 (two) times daily.   Yes [provider]  ondansetron (ZOFRAN) 4 MG tablet Take 4 mg by mouth as needed for nausea or vomiting.   Yes [provider]  oxyCODONE-acetaminophen (PERCOCET) 10-325 MG tablet Take 1 tablet by mouth every 4 (four) hours as needed. 11/30/17  Yes [provider]  Polyethyl Glycol-Propyl Glycol (LUBRICANT EYE DROPS) 0.4-0.3 % SOLN Place 1 drop into both eyes daily as needed (for lubricant eye drops).    Yes [provider]  potassium chloride (K-DUR,KLOR-CON) 10 MEQ tablet Take 40 mEq by mouth 2 (two) times daily.    Yes [provider]  sodium chloride (OCEAN) 0.65 %  SOLN nasal spray Place 1 spray into both nostrils as needed for congestion.   Yes [provider]  TURMERIC PO Take by mouth 2 (two) times daily.   Yes [provider]  amoxicillin-clavulanate (AUGMENTIN) 875-125 MG tablet Take 1 tablet by mouth 2 (two) times daily. One po bid x 7 days Patient not taking: Reported on 01/16/2018 12/31/17   Ripley Fraise, MD  bisacodyl (DULCOLAX) 5 MG EC tablet Take 1 tablet (5 mg total) by mouth daily. Patient not taking: Reported on 01/16/2018 03/26/16   Thurnell Lose, MD  HYDROcodone-acetaminophen (NORCO/VICODIN) 5-325 MG tablet Take 1-2 tablets by mouth every 6 (six) hours as needed. Patient not taking: Reported on 01/16/2018 12/20/17   Lily Kocher, PA-C  Oxycodone HCl 10 MG TABS Take 1 tablet (10 mg total) by mouth 2 (two) times daily as needed (pain). Patient not taking: Reported on 01/16/2018 09/16/16   Hollace Kinnier L, DO    Allergies as of 01/16/2018 - Review Complete 01/16/2018  Allergen Reaction Noted  . Aspirin Other (See Comments)   . Iodinated diagnostic agents Hives 12/20/2017  . Nexium [esomeprazole magnesium]  Other (See Comments) 11/14/2013    Family History  Problem Relation Age of Onset  . Colon cancer Father 50       deceased  . Prostate cancer Father   . Pancreatic cancer Mother 39       deceased  . Prostate cancer Brother   . Arthritis Son   . Arthritis Son   . Diabetes Mellitus II Son     Social History   Socioeconomic History  . Marital status: Widowed    Spouse name: Roger Kill  . Number of children: 5  . Years of education: College  . Highest education level: Not on file  Occupational History  . Occupation: disability    Employer: RETIRED  Social Needs  . Financial resource strain: Not on file  . Food insecurity:    Worry: Not on file    Inability: Not on file  . Transportation needs:    Medical: Not on file    Non-medical: Not on file  Tobacco Use  . Smoking status: Former Smoker    Years: 14.00    Types: Cigars    Start date: 11/20/1959    Last attempt to quit: 08/10/1973    Years since quitting: 44.4  . Smokeless tobacco: Never Used  Substance and Sexual Activity  . Alcohol use: No    Alcohol/week: 0.0 standard drinks    Comment: denies  . Drug use: No  . Sexual activity: Never    Partners: Male    Birth control/protection: None  Lifestyle  . Physical activity:    Days per week: Not on file    Minutes per session: Not on file  . Stress: Not on file  Relationships  . Social connections:    Talks on phone: Not on file    Gets together: Not on file    Attends religious service: Not on file    Active member of club or organization: Not on file    Attends meetings of clubs or organizations: Not on file    Relationship status: Not on file  . Intimate partner violence:    Fear of current or ex partner: Not on file    Emotionally abused: Not on file    Physically abused: Not on file    Forced sexual activity: Not on file  Other Topics Concern  . Not on file  Social  History Narrative   Patient lives at home with spouse and son.   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     Review of Systems: See HPI, otherwise negative ROS  Physical Exam: BP (!) 149/78   Pulse 75   Temp (!) 96.6 F (35.9 C) (Oral)   Ht 5\' 7"  (1.702 m)   Wt 272 lb 9.6 oz (123.7 kg)   BMI 42.70 kg/m  General:   Alert, obese pleasant and cooperative in NAD  Lungs:  Clear throughout to auscultation.   No wheezes, crackles, or rhonchi. No acute distress. Heart:  Regular rate and rhythm; no murmurs, clicks, rubs,  or gallops. Abdomen: significantly obese. Positive bowel sounds. No bruits. Minimal epigastric and left lower quadrant tenderness to palpation over the last organomegaly.  No bruits. Pulses:  Normal pulses noted. Extremities:  Without clubbing or edema.  Impression/Plan:  Patient is a 77 year old gentleman with multiple comorbidities complaining somewhat bitterly of left lower quadrant and epigastric pain. History of GERD  - currently on no acid suppression therapy. He denies dysphagia. Left lower quadrant abdominal pain not particularly related to bowel function. Certainly, he has a slant towards constipation. Amitiza was too strong. Recent CT and lab work reassuring. I doubt we are dealing with mesenteric ischemia. No obvious surgical process. At least part of his symptoms may be related to unbriddled GERD. Would like to see his bowel function stabilized/regulated and observe left lower quadrant abdominal pain. Further evaluation of epigastric pain warranted. Again, gallbladder out.  Recommendations:   Stop Amitiza; begin Linzess 72 - one gelcap daily for constipation - samples x 3 weeks  Trial of protonix 40 mg daily x 3 weeks (disp 21) no refills.  He has taken before and has tolerated.  Schedule  EGD (epigastric pain - propofol) ASAP. I have offered patient a diagnostic EGD to further evaluate his epigastric pain.  The risks, benefits, limitations, alternatives and imponderables have been reviewed with the patient.  Potential for esophageal dilation, biopsy, etc. have also been reviewed.  Questions have been answered. All parties agreeable.  Further recommendations to follow      Notice: This dictation was prepared with Dragon dictation along with smaller phrase technology. Any transcriptional errors that result from this process are unintentional and may not be corrected upon review.

## 2018-01-17 ENCOUNTER — Encounter (HOSPITAL_COMMUNITY): Payer: Self-pay

## 2018-01-17 ENCOUNTER — Encounter (HOSPITAL_COMMUNITY)
Admission: RE | Admit: 2018-01-17 | Discharge: 2018-01-17 | Disposition: A | Discharge status: Home / Self Care | Payer: Medicare HMO | Source: Ambulatory Visit | Attending: Internal Medicine | Admitting: Internal Medicine

## 2018-01-17 NOTE — Progress Notes (Signed)
iFOBT negative. Had appointment with Dr. Gala Romney yesterday and scheduled for EGD. Further recommendations to follow.

## 2018-01-18 ENCOUNTER — Ambulatory Visit (HOSPITAL_COMMUNITY): Payer: Medicare HMO | Admitting: Anesthesiology

## 2018-01-18 ENCOUNTER — Other Ambulatory Visit: Payer: Self-pay

## 2018-01-18 ENCOUNTER — Ambulatory Visit (HOSPITAL_COMMUNITY)
Admission: RE | Admit: 2018-01-18 | Discharge: 2018-01-18 | Disposition: A | Discharge status: Home / Self Care | Payer: Medicare HMO | Source: Ambulatory Visit | Attending: Internal Medicine | Admitting: Internal Medicine

## 2018-01-18 ENCOUNTER — Encounter (HOSPITAL_COMMUNITY): Payer: Self-pay | Admitting: *Deleted

## 2018-01-18 ENCOUNTER — Encounter (HOSPITAL_COMMUNITY)
Admission: RE | Disposition: A | Discharge status: Home / Self Care | Payer: Self-pay | Source: Ambulatory Visit | Attending: Internal Medicine

## 2018-01-18 DIAGNOSIS — N400 Enlarged prostate without lower urinary tract symptoms: Secondary | ICD-10-CM | POA: Insufficient documentation

## 2018-01-18 DIAGNOSIS — Z888 Allergy status to other drugs, medicaments and biological substances status: Secondary | ICD-10-CM | POA: Insufficient documentation

## 2018-01-18 DIAGNOSIS — R1013 Epigastric pain: Secondary | ICD-10-CM | POA: Diagnosis not present

## 2018-01-18 DIAGNOSIS — Z8711 Personal history of peptic ulcer disease: Secondary | ICD-10-CM | POA: Diagnosis not present

## 2018-01-18 DIAGNOSIS — Z8261 Family history of arthritis: Secondary | ICD-10-CM | POA: Insufficient documentation

## 2018-01-18 DIAGNOSIS — I110 Hypertensive heart disease with heart failure: Secondary | ICD-10-CM | POA: Diagnosis not present

## 2018-01-18 DIAGNOSIS — Z79899 Other long term (current) drug therapy: Secondary | ICD-10-CM | POA: Insufficient documentation

## 2018-01-18 DIAGNOSIS — K209 Esophagitis, unspecified: Secondary | ICD-10-CM | POA: Diagnosis not present

## 2018-01-18 DIAGNOSIS — I5032 Chronic diastolic (congestive) heart failure: Secondary | ICD-10-CM | POA: Insufficient documentation

## 2018-01-18 DIAGNOSIS — Z981 Arthrodesis status: Secondary | ICD-10-CM | POA: Diagnosis not present

## 2018-01-18 DIAGNOSIS — M545 Low back pain: Secondary | ICD-10-CM | POA: Insufficient documentation

## 2018-01-18 DIAGNOSIS — Z9049 Acquired absence of other specified parts of digestive tract: Secondary | ICD-10-CM | POA: Diagnosis not present

## 2018-01-18 DIAGNOSIS — Z91041 Radiographic dye allergy status: Secondary | ICD-10-CM | POA: Insufficient documentation

## 2018-01-18 DIAGNOSIS — Z9841 Cataract extraction status, right eye: Secondary | ICD-10-CM | POA: Diagnosis not present

## 2018-01-18 DIAGNOSIS — Z886 Allergy status to analgesic agent status: Secondary | ICD-10-CM | POA: Insufficient documentation

## 2018-01-18 DIAGNOSIS — H409 Unspecified glaucoma: Secondary | ICD-10-CM | POA: Diagnosis not present

## 2018-01-18 DIAGNOSIS — Z800 Family history of malignant neoplasm of digestive organs: Secondary | ICD-10-CM | POA: Insufficient documentation

## 2018-01-18 DIAGNOSIS — K210 Gastro-esophageal reflux disease with esophagitis: Secondary | ICD-10-CM | POA: Insufficient documentation

## 2018-01-18 DIAGNOSIS — K5900 Constipation, unspecified: Secondary | ICD-10-CM | POA: Insufficient documentation

## 2018-01-18 DIAGNOSIS — G8929 Other chronic pain: Secondary | ICD-10-CM | POA: Insufficient documentation

## 2018-01-18 DIAGNOSIS — Z833 Family history of diabetes mellitus: Secondary | ICD-10-CM | POA: Insufficient documentation

## 2018-01-18 DIAGNOSIS — Z87891 Personal history of nicotine dependence: Secondary | ICD-10-CM | POA: Insufficient documentation

## 2018-01-18 DIAGNOSIS — H9313 Tinnitus, bilateral: Secondary | ICD-10-CM | POA: Insufficient documentation

## 2018-01-18 DIAGNOSIS — M1990 Unspecified osteoarthritis, unspecified site: Secondary | ICD-10-CM | POA: Diagnosis not present

## 2018-01-18 DIAGNOSIS — Z86010 Personal history of colonic polyps: Secondary | ICD-10-CM | POA: Diagnosis not present

## 2018-01-18 DIAGNOSIS — R2681 Unsteadiness on feet: Secondary | ICD-10-CM | POA: Insufficient documentation

## 2018-01-18 DIAGNOSIS — R51 Headache: Secondary | ICD-10-CM | POA: Insufficient documentation

## 2018-01-18 DIAGNOSIS — G4730 Sleep apnea, unspecified: Secondary | ICD-10-CM | POA: Insufficient documentation

## 2018-01-18 DIAGNOSIS — Z7982 Long term (current) use of aspirin: Secondary | ICD-10-CM | POA: Insufficient documentation

## 2018-01-18 DIAGNOSIS — E039 Hypothyroidism, unspecified: Secondary | ICD-10-CM | POA: Diagnosis not present

## 2018-01-18 DIAGNOSIS — M359 Systemic involvement of connective tissue, unspecified: Secondary | ICD-10-CM | POA: Insufficient documentation

## 2018-01-18 DIAGNOSIS — Z87442 Personal history of urinary calculi: Secondary | ICD-10-CM | POA: Insufficient documentation

## 2018-01-18 DIAGNOSIS — Z6841 Body Mass Index (BMI) 40.0 and over, adult: Secondary | ICD-10-CM | POA: Insufficient documentation

## 2018-01-18 DIAGNOSIS — I2510 Atherosclerotic heart disease of native coronary artery without angina pectoris: Secondary | ICD-10-CM | POA: Diagnosis not present

## 2018-01-18 DIAGNOSIS — E785 Hyperlipidemia, unspecified: Secondary | ICD-10-CM | POA: Diagnosis not present

## 2018-01-18 DIAGNOSIS — Z8042 Family history of malignant neoplasm of prostate: Secondary | ICD-10-CM | POA: Insufficient documentation

## 2018-01-18 HISTORY — PX: ESOPHAGOGASTRODUODENOSCOPY (EGD) WITH PROPOFOL: SHX5813

## 2018-01-18 SURGERY — ESOPHAGOGASTRODUODENOSCOPY (EGD) WITH PROPOFOL
Anesthesia: General

## 2018-01-18 MED ORDER — HYDROCODONE-ACETAMINOPHEN 7.5-325 MG PO TABS: 1.0000 | ORAL_TABLET | Freq: Once | ORAL | Status: DC | PRN

## 2018-01-18 MED ORDER — KETAMINE HCL 10 MG/ML IJ SOLN
INTRAMUSCULAR | Status: AC
  Filled 2018-01-18: qty 1

## 2018-01-18 MED ORDER — CHLORHEXIDINE GLUCONATE CLOTH 2 % EX PADS: 6.0000 | MEDICATED_PAD | Freq: Once | CUTANEOUS | Status: DC

## 2018-01-18 MED ORDER — FENTANYL CITRATE (PF) 100 MCG/2ML IJ SOLN: 25.0000 ug | INTRAMUSCULAR | Status: DC | PRN

## 2018-01-18 MED ORDER — KETAMINE HCL 10 MG/ML IJ SOLN
INTRAMUSCULAR | Status: DC | PRN
  Administered 2018-01-18 (×2): 10 mg via INTRAVENOUS

## 2018-01-18 MED ORDER — PROPOFOL 500 MG/50ML IV EMUL
INTRAVENOUS | Status: DC | PRN
  Administered 2018-01-18: 150 ug/kg/min via INTRAVENOUS

## 2018-01-18 MED ORDER — LACTATED RINGERS IV SOLN
INTRAVENOUS | Status: DC
  Administered 2018-01-18: 14:00:00 via INTRAVENOUS

## 2018-01-18 MED ORDER — GLYCOPYRROLATE 0.2 MG/ML IJ SOLN
INTRAMUSCULAR | Status: DC | PRN
  Administered 2018-01-18: 0.2 mg via INTRAVENOUS

## 2018-01-18 MED ORDER — LIDOCAINE HCL (CARDIAC) PF 50 MG/5ML IV SOSY
PREFILLED_SYRINGE | INTRAVENOUS | Status: DC | PRN
  Administered 2018-01-18: 40 mg via INTRAVENOUS

## 2018-01-18 NOTE — Anesthesia Postprocedure Evaluation (Signed)
Anesthesia Post Note  Patient: [name] [name].  Procedure(s) Performed: ESOPHAGOGASTRODUODENOSCOPY (EGD) WITH PROPOFOL (N/A )  Patient location during evaluation: PACU Anesthesia Type: General Level of consciousness: awake and alert Pain management: pain level controlled Vital Signs Assessment: post-procedure vital signs reviewed and stable Respiratory status: spontaneous breathing Cardiovascular status: stable Postop Assessment: no apparent nausea or vomiting Anesthetic complications: no     Last Vitals:  Vitals:   01/18/18 1254 01/18/18 1517  BP: 121/73 (!) 91/55  Pulse: 65 66  Resp:  17  Temp:  36.6 C  SpO2:  100%    Last Pain:  Vitals:   01/18/18 1458  TempSrc:   PainSc: 6                  Annebelle Bostic A

## 2018-01-18 NOTE — Transfer of Care (Signed)
Immediate Anesthesia Transfer of Care Note  Patient: [name] [name].  Procedure(s) Performed: ESOPHAGOGASTRODUODENOSCOPY (EGD) WITH PROPOFOL (N/A )  Patient Location: PACU  Anesthesia Type:MAC  Level of Consciousness: awake, alert  and oriented  Airway & Oxygen Therapy: Patient Spontanous Breathing  Post-op Assessment: Report given to RN and Post -op Vital signs reviewed and stable  Post vital signs: Reviewed and stable  Last Vitals:  Vitals Value Taken Time  BP    Temp    Pulse 71 01/18/2018  3:19 PM  Resp 16 01/18/2018  3:19 PM  SpO2 92 % 01/18/2018  3:19 PM  Vitals shown include unvalidated device data.  Last Pain:  Vitals:   01/18/18 1458  TempSrc:   PainSc: 6       Patients Stated Pain Goal: 8 (01/56/15 3794)  Complications: No apparent anesthesia complications

## 2018-01-18 NOTE — Op Note (Signed)
[facility] Patient Name: [name] [name] Procedure Date: 01/18/2018 2:59 PM MRN: [id number] Date of Birth: May 11, 1941 Attending MD: Norvel Richards , MD CSN: [id number] Age: 77 Admit Type: Outpatient Procedure:                Upper GI endoscopy Indications:              Epigastric abdominal pain Providers:                Norvel Richards, MD, Jeanann Lewandowsky. Sharon Seller, RN,                            Nelma Rothman, Technician Referring MD:              Medicines:                Propofol per Anesthesia Complications:            No immediate complications. Estimated Blood Loss:     Estimated blood loss: none. Procedure:                Pre-Anesthesia Assessment:                           - Prior to the procedure, a History and Physical                            was performed, and patient medications and                            allergies were reviewed. The patient's tolerance of                            previous anesthesia was also reviewed. The risks                            and benefits of the procedure and the sedation                            options and risks were discussed with the patient.                            All questions were answered, and informed consent                            was obtained. Prior Anticoagulants: The patient has                            taken no previous anticoagulant or antiplatelet                            agents. ASA Grade Assessment: II - A patient with                            mild systemic disease. After reviewing the risks  and benefits, the patient was deemed in                            satisfactory condition to undergo the procedure.                           After obtaining informed consent, the endoscope was                            passed under direct vision. Throughout the                            procedure, the patient's blood pressure, pulse, and                            oxygen  saturations were monitored continuously. The                            [id number]-H190 ([id number]) scope was introduced through the                            and advanced to the second part of duodenum. The                            upper GI endoscopy was accomplished without                            difficulty. The patient tolerated the procedure                            well. Scope In: 3:06:26 PM Scope Out: 3:10:19 PM Total Procedure Duration: 0 hours 3 minutes 53 seconds  Findings:      Esophagitis was found. minimal distal esophageal erosions wn 5 mm of       GEJ. No Barrett's epithelium seen.      The entire examined stomach was normal.      The duodenal bulb and second portion of the duodenum were normal. Impression:               - Mild erosive reflux esophagitis.                           - Normal stomach.                           - Normal duodenal bulb and second portion of the                            duodenum.                           - No specimens collected. Moderate Sedation:      Moderate (conscious) sedation was personally administered by an       anesthesia professional. The following parameters were monitored: oxygen       saturation, heart rate, blood pressure, respiratory rate, EKG, adequacy  of pulmonary ventilation, and response to care. Total physician       intraservice time was 9 minutes. Recommendation:           - Patient has a contact number available for                            emergencies. The signs and symptoms of potential                            delayed complications were discussed with the                            patient. Return to normal activities tomorrow.                            Written discharge instructions were provided to the                            patient.                           - Resume previous diet. Continue Protonix and                            Linzess daily                           - Continue present  medications.                           - No repeat upper endoscopy.                           - Return to GI office in 4 weeks. Procedure Code(s):        --- Professional ---                           [phone], Esophagogastroduodenoscopy, flexible,                            transoral; diagnostic, including collection of                            specimen(s) by brushing or washing, when performed                            (separate procedure) Diagnosis Code(s):        --- Professional ---                           K20.9, Esophagitis, unspecified                           R10.13, Epigastric pain CPT copyright 2017 American Medical Association. All rights reserved. The codes documented in this report are preliminary and upon coder review may  be revised to meet current compliance requirements. Cristopher Estimable. Gala Romney, MD Herbie Baltimore  Garfield Cornea, MD 01/18/2018 3:15:57 PM This report has been signed electronically. Number of Addenda: 0

## 2018-01-18 NOTE — Anesthesia Preprocedure Evaluation (Signed)
Anesthesia Evaluation    History of Anesthesia Complications (+) PONV  Airway Mallampati: II  TM Distance: >3 FB Neck ROM: Full    Dental no notable dental hx. 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, Residual Symptoms    GI/Hepatic PUD, GERD  Medicated and Controlled,  Endo/Other  Hypothyroidism Morbid obesity  Renal/GU Renal InsufficiencyRenal disease     Musculoskeletal  (+) Arthritis , Osteoarthritis,    Abdominal   Peds  Hematology   Anesthesia Other Findings   Reproductive/Obstetrics                             Anesthesia Physical Anesthesia Plan  ASA: III  Anesthesia Plan: General   Post-op Pain Management:    Induction:   PONV Risk Score and Plan:   Airway Management Planned: Simple Face Mask and Nasal Cannula  Additional Equipment:   Intra-op Plan:   Post-operative Plan:   Informed Consent:   Plan Discussed with:   Anesthesia Plan Comments:         Anesthesia Quick Evaluation

## 2018-01-18 NOTE — Anesthesia Procedure Notes (Signed)
Procedure Name: [facility] Performed by: Andree Elk Donnis Phaneuf A, CRNA Pre-anesthesia Checklist: Patient identified, Emergency Drugs available, Suction available, Patient being monitored and Timeout performed Oxygen Delivery Method: Simple face mask

## 2018-01-18 NOTE — Interval H&P Note (Signed)
History and Physical Interval Note:  01/18/2018 2:53 PM  [name] [name] [name] [name].  has presented today for surgery, with the diagnosis of epigastric pain  The various methods of treatment have been discussed with the patient and family. After consideration of risks, benefits and other options for treatment, the patient has consented to  Procedure(s) with comments: ESOPHAGOGASTRODUODENOSCOPY (EGD) WITH PROPOFOL (N/A) - 1:45pm as a surgical intervention .  The patient's history has been reviewed, patient examined, no change in status, stable for surgery.  I have reviewed the patient's chart and labs.  Questions were answered to the patient's satisfaction.     [name] [name]  No change. Patient denies dysphagia. Just started on Linzess and protonix earlier this week.  Diagnostic EGD per plan.  The risks, benefits, limitations, alternatives and imponderables have been reviewed with the patient. Potential for esophageal dilation, biopsy, etc. have also been reviewed.  Questions have been answered. All parties agreeable.

## 2018-01-18 NOTE — Discharge Instructions (Signed)
EGD Discharge instructions Please read the instructions outlined below and refer to this sheet in the next few weeks. These discharge instructions provide you with general information on caring for yourself after you leave the hospital. Your doctor may also give you specific instructions. While your treatment has been planned according to the most current medical practices available, unavoidable complications occasionally occur. If you have any problems or questions after discharge, please call your doctor. ACTIVITY  You may resume your regular activity but move at a slower pace for the next 24 hours.   Take frequent rest periods for the next 24 hours.   Walking will help expel (get rid of) the air and reduce the bloated feeling in your abdomen.   No driving for 24 hours (because of the anesthesia (medicine) used during the test).   You may shower.   Do not sign any important legal documents or operate any machinery for 24 hours (because of the anesthesia used during the test).  NUTRITION  Drink plenty of fluids.   You may resume your normal diet.   Begin with a light meal and progress to your normal diet.   Avoid alcoholic beverages for 24 hours or as instructed by your caregiver.  MEDICATIONS  You may resume your normal medications unless your caregiver tells you otherwise.  WHAT YOU CAN EXPECT TODAY  You may experience abdominal discomfort such as a feeling of fullness or gas pains.  FOLLOW-UP  Your doctor will discuss the results of your test with you.  SEEK IMMEDIATE MEDICAL ATTENTION IF ANY OF THE FOLLOWING OCCUR:  Excessive nausea (feeling sick to your stomach) and/or vomiting.   Severe abdominal pain and distention (swelling).   Trouble swallowing.   Temperature over 101 F (37.8 C).   Rectal bleeding or vomiting of blood.    GERD information provided  Continue Protonix and Linzess daily  Office visit with [name] in one month  PATIENT  INSTRUCTIONS POST-ANESTHESIA  IMMEDIATELY FOLLOWING SURGERY:  Do not drive or operate machinery for the first twenty four hours after surgery.  Do not make any important decisions for twenty four hours after surgery or while taking narcotic pain medications or sedatives.  If you develop intractable nausea and vomiting or a severe headache please notify your doctor immediately.  FOLLOW-UP:  Please make an appointment with your surgeon as instructed. You do not need to follow up with anesthesia unless specifically instructed to do so.  WOUND CARE INSTRUCTIONS (if applicable):  Keep a dry clean dressing on the anesthesia/puncture wound site if there is drainage.  Once the wound has quit draining you may leave it open to air.  Generally you should leave the bandage intact for twenty four hours unless there is drainage.  If the epidural site drains for more than 36-48 hours please call the anesthesia department.  QUESTIONS?:  Please feel free to call your physician or the hospital operator if you have any questions, and they will be happy to assist you.      Gastroesophageal Reflux Disease, Adult Normally, food travels down the esophagus and stays in the stomach to be digested. If a person has gastroesophageal reflux disease (GERD), food and stomach acid move back up into the esophagus. When this happens, the esophagus becomes sore and swollen (inflamed). Over time, GERD can make small holes (ulcers) in the lining of the esophagus. Follow these instructions at home: Diet  Follow a diet as told by your doctor. You may need to avoid foods and  drinks such as: ? Coffee and tea (with or without caffeine). ? Drinks that contain alcohol. ? Energy drinks and sports drinks. ? Carbonated drinks or sodas. ? Chocolate and cocoa. ? Peppermint and mint flavorings. ? Garlic and onions. ? Horseradish. ? Spicy and acidic foods, such as peppers, chili powder, curry powder, vinegar, hot sauces, and BBQ  sauce. ? Citrus fruit juices and citrus fruits, such as oranges, lemons, and limes. ? Tomato-based foods, such as red sauce, chili, salsa, and pizza with red sauce. ? Fried and fatty foods, such as donuts, french fries, potato chips, and high-fat dressings. ? High-fat meats, such as hot dogs, rib eye steak, sausage, ham, and bacon. ? High-fat dairy items, such as whole milk, butter, and cream cheese.  Eat small meals often. Avoid eating large meals.  Avoid drinking large amounts of liquid with your meals.  Avoid eating meals during the 2-3 hours before bedtime.  Avoid lying down right after you eat.  Do not exercise right after you eat. General instructions  Pay attention to any changes in your symptoms.  Take over-the-counter and prescription medicines only as told by your doctor. Do not take aspirin, ibuprofen, or other NSAIDs unless your doctor says it is okay.  Do not use any tobacco products, including cigarettes, chewing tobacco, and e-cigarettes. If you need help quitting, ask your doctor.  Wear loose clothes. Do not wear anything tight around your waist.  Raise (elevate) the head of your bed about 6 inches (15 cm).  Try to lower your stress. If you need help doing this, ask your doctor.  If you are overweight, lose an amount of weight that is healthy for you. Ask your doctor about a safe weight loss goal.  Keep all follow-up visits as told by your doctor. This is important. Contact a doctor if:  You have new symptoms.  You lose weight and you do not know why it is happening.  You have trouble swallowing, or it hurts to swallow.  You have wheezing or a cough that keeps happening.  Your symptoms do not get better with treatment.  You have a hoarse voice. Get help right away if:  You have pain in your arms, neck, jaw, teeth, or back.  You feel sweaty, dizzy, or light-headed.  You have chest pain or shortness of breath.  You throw up (vomit) and your throw up  looks like blood or coffee grounds.  You pass out (faint).  Your poop (stool) is bloody or black.  You cannot swallow, drink, or eat. This information is not intended to replace advice given to you by your health care provider. Make sure you discuss any questions you have with your health care provider. Document Released: 10/26/2007 Document Revised: 10/15/2015 Document Reviewed: 09/03/2014 Elsevier Interactive Patient Education  [employer].

## 2018-01-23 ENCOUNTER — Encounter (HOSPITAL_COMMUNITY): Payer: Self-pay | Admitting: Internal Medicine

## 2018-01-23 DIAGNOSIS — M170 Bilateral primary osteoarthritis of knee: Secondary | ICD-10-CM | POA: Diagnosis not present

## 2018-01-23 DIAGNOSIS — M25562 Pain in left knee: Secondary | ICD-10-CM | POA: Diagnosis not present

## 2018-01-23 DIAGNOSIS — M25561 Pain in right knee: Secondary | ICD-10-CM | POA: Diagnosis not present

## 2018-01-25 DIAGNOSIS — M25561 Pain in right knee: Secondary | ICD-10-CM | POA: Diagnosis not present

## 2018-01-30 ENCOUNTER — Ambulatory Visit: Payer: Medicare HMO | Admitting: Urology

## 2018-01-30 DIAGNOSIS — R351 Nocturia: Secondary | ICD-10-CM | POA: Diagnosis not present

## 2018-01-30 DIAGNOSIS — R972 Elevated prostate specific antigen [PSA]: Secondary | ICD-10-CM

## 2018-01-30 DIAGNOSIS — N401 Enlarged prostate with lower urinary tract symptoms: Secondary | ICD-10-CM | POA: Diagnosis not present

## 2018-02-01 ENCOUNTER — Other Ambulatory Visit (HOSPITAL_COMMUNITY): Payer: Self-pay | Admitting: *Deleted

## 2018-02-01 DIAGNOSIS — D696 Thrombocytopenia, unspecified: Secondary | ICD-10-CM

## 2018-02-02 ENCOUNTER — Inpatient Hospital Stay (HOSPITAL_COMMUNITY): Payer: Medicare HMO | Attending: Hematology

## 2018-02-02 DIAGNOSIS — Z800 Family history of malignant neoplasm of digestive organs: Secondary | ICD-10-CM | POA: Diagnosis not present

## 2018-02-02 DIAGNOSIS — Z87891 Personal history of nicotine dependence: Secondary | ICD-10-CM | POA: Insufficient documentation

## 2018-02-02 DIAGNOSIS — D72829 Elevated white blood cell count, unspecified: Secondary | ICD-10-CM | POA: Insufficient documentation

## 2018-02-02 DIAGNOSIS — Z79899 Other long term (current) drug therapy: Secondary | ICD-10-CM | POA: Diagnosis not present

## 2018-02-02 DIAGNOSIS — Z833 Family history of diabetes mellitus: Secondary | ICD-10-CM | POA: Insufficient documentation

## 2018-02-02 DIAGNOSIS — G8929 Other chronic pain: Secondary | ICD-10-CM | POA: Diagnosis not present

## 2018-02-02 DIAGNOSIS — M545 Low back pain: Secondary | ICD-10-CM | POA: Insufficient documentation

## 2018-02-02 DIAGNOSIS — Z8042 Family history of malignant neoplasm of prostate: Secondary | ICD-10-CM | POA: Diagnosis not present

## 2018-02-02 DIAGNOSIS — D696 Thrombocytopenia, unspecified: Secondary | ICD-10-CM | POA: Diagnosis not present

## 2018-02-02 LAB — CBC WITH DIFFERENTIAL/PLATELET
BASOS PCT: 0 %
Basophils Absolute: 0 10*3/uL (ref 0.0–0.1)
Eosinophils Absolute: 0.1 10*3/uL (ref 0.0–0.7)
Eosinophils Relative: 1 %
HCT: 45.5 % (ref 39.0–52.0)
HEMOGLOBIN: 15.1 g/dL (ref 13.0–17.0)
LYMPHS ABS: 1.5 10*3/uL (ref 0.7–4.0)
Lymphocytes Relative: 14 %
MCH: 26.4 pg (ref 26.0–34.0)
MCHC: 33.2 g/dL (ref 30.0–36.0)
MCV: 79.7 fL (ref 78.0–100.0)
MONO ABS: 0.3 10*3/uL (ref 0.1–1.0)
MONOS PCT: 3 %
NEUTROS PCT: 82 %
Neutro Abs: 8.8 10*3/uL — ABNORMAL HIGH (ref 1.7–7.7)
Platelets: 98 10*3/uL — ABNORMAL LOW (ref 150–400)
RBC: 5.71 MIL/uL (ref 4.22–5.81)
RDW: 16.3 % — AB (ref 11.5–15.5)
WBC: 10.6 10*3/uL — ABNORMAL HIGH (ref 4.0–10.5)

## 2018-02-02 LAB — FERRITIN: FERRITIN: 54 ng/mL (ref 24–336)

## 2018-02-02 LAB — COMPREHENSIVE METABOLIC PANEL
ALK PHOS: 60 U/L (ref 38–126)
ALT: 11 U/L (ref 0–44)
ANION GAP: 7 (ref 5–15)
AST: 13 U/L — ABNORMAL LOW (ref 15–41)
Albumin: 3.6 g/dL (ref 3.5–5.0)
BILIRUBIN TOTAL: 1 mg/dL (ref 0.3–1.2)
BUN: 15 mg/dL (ref 8–23)
CALCIUM: 9.2 mg/dL (ref 8.9–10.3)
CO2: 26 mmol/L (ref 22–32)
Chloride: 107 mmol/L (ref 98–111)
Creatinine, Ser: 1.16 mg/dL (ref 0.61–1.24)
GFR calc non Af Amer: 58 mL/min — ABNORMAL LOW (ref >60)
Glucose, Bld: 145 mg/dL — ABNORMAL HIGH (ref 70–99)
POTASSIUM: 4.2 mmol/L (ref 3.5–5.1)
Sodium: 140 mmol/L (ref 135–145)
TOTAL PROTEIN: 6.4 g/dL — AB (ref 6.5–8.1)

## 2018-02-02 LAB — IRON AND TIBC
IRON: 84 ug/dL (ref 45–182)
SATURATION RATIOS: 35 % (ref 17.9–39.5)
TIBC: 237 ug/dL — AB (ref 250–450)
UIBC: 153 ug/dL

## 2018-02-09 ENCOUNTER — Inpatient Hospital Stay (HOSPITAL_BASED_OUTPATIENT_CLINIC_OR_DEPARTMENT_OTHER): Payer: Medicare HMO | Admitting: Internal Medicine

## 2018-02-09 ENCOUNTER — Encounter (HOSPITAL_COMMUNITY): Payer: Self-pay | Admitting: Internal Medicine

## 2018-02-09 VITALS — BP 160/67 | HR 80 | Temp 97.5°F | Resp 18 | Wt 273.0 lb

## 2018-02-09 DIAGNOSIS — Z87891 Personal history of nicotine dependence: Secondary | ICD-10-CM

## 2018-02-09 DIAGNOSIS — Z833 Family history of diabetes mellitus: Secondary | ICD-10-CM

## 2018-02-09 DIAGNOSIS — Z8042 Family history of malignant neoplasm of prostate: Secondary | ICD-10-CM | POA: Diagnosis not present

## 2018-02-09 DIAGNOSIS — D696 Thrombocytopenia, unspecified: Secondary | ICD-10-CM

## 2018-02-09 DIAGNOSIS — R634 Abnormal weight loss: Secondary | ICD-10-CM

## 2018-02-09 DIAGNOSIS — R140 Abdominal distension (gaseous): Secondary | ICD-10-CM

## 2018-02-09 DIAGNOSIS — Z79899 Other long term (current) drug therapy: Secondary | ICD-10-CM | POA: Diagnosis not present

## 2018-02-09 DIAGNOSIS — M545 Low back pain: Secondary | ICD-10-CM | POA: Diagnosis not present

## 2018-02-09 DIAGNOSIS — Z800 Family history of malignant neoplasm of digestive organs: Secondary | ICD-10-CM

## 2018-02-09 DIAGNOSIS — G8929 Other chronic pain: Secondary | ICD-10-CM

## 2018-02-09 DIAGNOSIS — D72829 Elevated white blood cell count, unspecified: Secondary | ICD-10-CM | POA: Diagnosis not present

## 2018-02-09 NOTE — Patient Instructions (Signed)
[facility] at [facility] Discharge Instructions  You saw Dr. Higgs today.   Thank you for choosing [facility] at [facility] to provide your oncology and hematology care.  To afford each patient quality time with our provider, please arrive at least 15 minutes before your scheduled appointment time.   If you have a lab appointment with the Cancer Center please come in thru the  Main Entrance and check in at the main information desk  You need to re-schedule your appointment should you arrive 10 or more minutes late.  We strive to give you quality time with our providers, and arriving late affects you and other patients whose appointments are after yours.  Also, if you no show three or more times for appointments you may be dismissed from the clinic at the providers discretion.     Again, thank you for choosing [facility].  Our hope is that these requests will decrease the amount of time that you wait before being seen by our physicians.       _____________________________________________________________  Should you have questions after your visit to [facility], please contact our office at [phone] between the hours of 8:00 a.m. and 4:30 p.m.  Voicemails left after 4:00 p.m. will not be returned until the following business day.  For prescription refill requests, have your pharmacy contact our office and allow 72 hours.    Cancer Center Support Programs:   > Cancer Support Group  2nd Tuesday of the month 1pm-2pm, Journey Room    

## 2018-02-09 NOTE — Progress Notes (Signed)
Diagnosis Chronic bilateral low back pain without sciatica - Plan: MR Lumbar Spine W Wo Contrast, CBC with Differential/Platelet, Comprehensive metabolic panel, Lactate dehydrogenase, Protein electrophoresis, serum, C-reactive protein, Rheumatoid factor, Sedimentation rate, MR PELVIS W WO CONTRAST, CANCELED: MR Pelvis W Contrast  Abdominal distension (gaseous) - Plan: CT ABDOMEN PELVIS W CONTRAST, MR Lumbar Spine W Wo Contrast, CBC with Differential/Platelet, Comprehensive metabolic panel, Lactate dehydrogenase, Protein electrophoresis, serum, C-reactive protein, Rheumatoid factor, Sedimentation rate, MR PELVIS W WO CONTRAST, CANCELED: MR Pelvis W Contrast  Abnormal weight loss - Plan: CT ABDOMEN PELVIS W CONTRAST  Staging Cancer Staging No matching staging information was found for the patient.  Assessment and Plan:  1.  Leukocytosis, neutrophilia.  Pt was followed by Dr. Talbert Cage.  Labs done 08/01/2017 showed a white count 10.4 hemoglobin 14.8 platelets 103,000.  Creatinine is normal at 1 ferritin is 41.  White count is within normal limits on recent labs.  He had previous evaluation with a negative BCR/ABL  and normal sed rate, C-reactive protein,.  He also had a negative Jak 2 evaluation.    Labs done 02/02/2018 reviewed and showed WBC of 10.6 HB 15 plts 98,000.  Chemistries WNL with K+ 4.2 Cr 1.16 and normal LFTS.  Ferritin 54.  Work-up of elevated WBC is negative.  Normal variant with only mild elevation noted.    2.  Thrombocytopenia.  Plt count 98.000 onlabs done 02/02/2018.  CT chest abdomen and pelvis done 06/02/2017 showed mild splenomegaly.  Will check hepatitis and HIV on RTC.  Suspect splenomegaly contributing to thrombocytopenia.   3.  Colon lesion.  CT OF ABDOMEN AND PELVIS DONE 05/27/2017 showed  IMPRESSION: 1. Focal inflamed masslike area involving the cecum at the level of the terminal ileum. Findings may represent focal cecal diverticulitis, an infectious or inflammatory process or  potentially cecal mass/neoplastic process. Recommend clinical and laboratory correlation. Additionally, patient will need further evaluation with colonoscopy after resolution of the acute symptomatology. 2. Mild wall thickening of the rectum, potentially sequelae of infectious process. 3. Wall thickening of the urinary bladder raising the possibility of cystitis. 4. Re- demonstrated slowly enlarging soft tissue mass within the presacral location. Recommend further evaluation with pelvic MRI. 5. Mild splenomegaly.  Was seen by GI and had EGD done 01/18/2018 that was negative.  He will be set up for CT of abdomen and pelvis for follow-up.  He will RTC to go over results.    4. Presacral mass.   MRI of the pelvis done 05/30/2017 shows:   IMPRESSION: 1. 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 Trace edema in the adjacent fascia plane with the perirectal space. There was some faint nodularity in this vicinity in 2015 but no abnormality in the area prior to that. The lesion has slowly enlarged and seems to have a potentially linear fatty element extending sagittally in the right paracentral position. Given the fatty element, possibilities may include myelolipoma, extramedullary hematopoiesis, solitary fibrous tumor, low-grade lymphoma, or soft tissue hemangioma. Given the slow growth and fatty elements this would be unusual for metastatic disease. Appearance not considered characteristic for chordoma and would be unusual for a neurogenic origin tumor such as schwannoma based on the intermediate T2 signal. Consider surveillance or resection.  Pt has undergone several back surgeries.  Will set up MRI of pelvis and lumbar spine for ongoing follow-up of previous MRI.  He will follow-up in 2 weeks to go over results.  30 minutes spent with more than 50% spent in counseling and coordination of care.    Interval  History:  Historical data obtained from note dated 08/09/2017.  77 year old male followed by Dr. Talbert Cage  for leukocytosis and thrombocytopenia.  He has undergone evaluation with normal BCR/ABL,  normal sed rate, C-reactive protein, negative Jak2.  Current Status: Patient is seen today for follow-up.  He reports back pain.  Pt has been seen by surgery and reports he had several back surgeries.    Problem List Patient Active Problem List   Diagnosis Date Noted  . Abdominal pain [R10.9] 12/20/2017  . Diverticulitis of colon [K57.32] 06/28/2017  . Gastrointestinal hemorrhage with melena [K92.1] 05/27/2017  . Acute cystitis [N30.00] 05/27/2017  . Pelvic mass in male [R19.00] 05/27/2017  . Acute diverticulitis [K57.92] 05/27/2017  . Heme positive stool [R19.5] 05/27/2017  . Prostatitis [N41.9] 05/27/2017  . S/P colonoscopy [Z98.890] 05/27/2017  . Bilateral primary osteoarthritis of knee [M17.0] 04/06/2017  . Weakness [R53.1] 02/27/2017  . Ventral hernia [K43.9] 02/27/2017  . Acute CVA (cerebrovascular accident) ([place]) [I63.9] 09/13/2016  . Stroke (cerebrum) ([place]) [I63.9] 09/13/2016  . Syncope [R55] 09/12/2016  . Iron deficiency [E61.1] 07/07/2016  . Back pain [M54.9] 03/22/2016  . Acute right-sided low back pain with right-sided sciatica [M54.41]   . S/P nasal septoplasty [Z98.890] 10/07/2015  . Parotitis, acute [K11.21] 03/06/2015  . Nausea without vomiting [R11.0] 12/08/2014  . Dysphagia, pharyngoesophageal phase [R13.14]   . History of colonic polyps [Z86.010]   . Hypokalemia [E87.6] 09/01/2013  . Dehydration [E86.0] 09/01/2013  . Leukocytosis [D72.829] 09/01/2013  . Diastolic dysfunction by echo Dec 2014 [I51.89] 07/30/2013  . Morbid obesity- BMI 44.5 [E66.01] 07/30/2013  . Edema of both legs [R60.0] 07/30/2013  . Bilateral lower leg cellulitis [L03.116, L03.115] 07/18/2013  . 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 OSA (obstructive sleep apnea) [G47.33] 11/27/2012  .  Other spondylosis with radiculopathy, lumbar region [facility] 08/22/2012  . Bright red blood per rectum [K62.5] 06/15/2012  . Constipation [K59.00] 02/29/2012  . SIRS (systemic inflammatory response syndrome) (H[facility]) [R65.10] 02/27/2012  . Hyperlipidemia [E78.5] 02/27/2012  . Thrombocytopenia ([place]) [D69.6] 12/30/2011  . Hypothyroidism [E03.9] 10/27/2011  . Upper abdominal pain [R10.10] 10/27/2011  . Rectal bleeding [K62.5] 11/16/2010  . DIARRHEA [R19.7] 06/08/2010  . COLONIC POLYPS, ADENOMATOUS, HX OF [Z86.010] 07/16/2009  . Essential hypertension [I10] 06/19/2009  . GASTROESOPHAGEAL REFLUX DISEASE, CHRONIC [K21.9] 06/19/2009  . FATTY LIVER DISEASE [K76.89] 06/19/2009  . CHOLELITHIASIS, SYMPTOMATIC [K80.20] 06/19/2009  . ABDOMINAL BLOATING [R14.3, R14.1, R14.2] 06/19/2009    Past Medical History Past Medical History:  Diagnosis Date  . Assistance needed for mobility    cane, walker  . Bilateral lower leg cellulitis   . BPH (benign prostatic hyperplasia)   . Cataract    left  . Chronic diastolic HF (heart failure) ([place])   . Chronic low back pain   . Collagen vascular disease ([place])    history of venous insufficency and LE cellulitis  . Complication of anesthesia    2012 due to sleep apnea pt has had difficulty being put under  and waking up from anesthesia  . Deep venous insufficiency   . GERD (gastroesophageal reflux disease)   . Glaucoma    left  . Headache   . Heel spur    right heel  . History of bladder infections   . History of colonic polyps   . Hyperlipidemia   . Hypertension    Sees  Dr. Debara Pickett  . Hypothyroidism    hypothyroid denies no rx  . Iron deficiency 07/07/2016  . Kidney stones    passed them  . Obesity   . Osteoarthritis   . PONV (postoperative nausea and vomiting)   . PUD (peptic ulcer disease)   . Ringing in ears   . S/P colonoscopy 2007, Feb and March 2012   hx of adenomas, left-sided diverticula, On 07/2010 TCS, fresh blood and clot noted coming  from TI.  . S/P endoscopy 2007, 2012   2007: nl, May 2012: antral erosions  . SIRS (systemic inflammatory response syndrome) ([place]) 02/27/2012  . Sleep apnea    supposed to see Dr. Luan Pulling to get it started back, hasn't been on in since 2018  . Thrombocytopenia ([place]) 12/30/2011   Stable    Past Surgical History Past Surgical History:  Procedure Laterality Date  . BACK SURGERY    . BACK SURGERY  12/12/2017  . CARDIAC CATHETERIZATION  02/27/2007   no sign of CAD, LVH, mod pulm HTN (Dr. Jackie Plum(  . CATARACT EXTRACTION W/ INTRAOCULAR LENS IMPLANT Right ~ 2012  . COLONOSCOPY N/A 05/28/2014   WCB:JSEGBTDV colonic polyps/colonic diverticulosis. Tubular adenomas. Next colonoscopy January 2021  . ESOPHAGOGASTRODUODENOSCOPY  Aug 2013   [facility], single 17m pedunculated polyp otherwise normal. HYPERPLASTIC, NEGATIVE h.pylori  . ESOPHAGOGASTRODUODENOSCOPY N/A 05/28/2014   RMR:non-critical Schatzki's ring/HH. Benign gastritis  . ESOPHAGOGASTRODUODENOSCOPY (EGD) WITH PROPOFOL N/A 01/18/2018   Procedure: ESOPHAGOGASTRODUODENOSCOPY (EGD) WITH PROPOFOL;  Surgeon: RDaneil Dolin MD;  Location: [facility];  Service: Endoscopy;  Laterality: N/A;  1:45pm  . INGUINAL HERNIA REPAIR Right 1941  . KNEE ARTHROSCOPY Left ~ 2000  . LAPAROSCOPIC CHOLECYSTECTOMY    . LCamp DouglasSURGERY  2012  . MALONEY DILATION N/A 05/28/2014   Procedure: MVenia MinksDILATION;  Surgeon: RDaneil Dolin MD;  Location: [facility];  Service: Endoscopy;  Laterality: N/A;  . NASAL SEPTOPLASTY W/ TURBINOPLASTY Bilateral 10/07/2015   Procedure: NASAL SEPTOPLASTY WITH TURBINATE REDUCTION;  Surgeon: SLeta Baptist MD;  [place]  Service: ENT;  Laterality: Bilateral;  . NASAL SEPTUM SURGERY Bilateral 10/07/2015    inferior turbinate resection  . NM MYOCAR PERF WALL MOTION  12/2006   dipyridamole myoview - stress images show mild perfusion defect in mid inferior walls with reversibility at rest; mild perfusion defect in mid inferolateral wall at  stress with mild defect reversibility, EF 62%, abnormal but low risk study   . POSTERIOR LUMBAR FUSION  2015  . SAVORY DILATION N/A 05/28/2014   Procedure: SAVORY DILATION;  Surgeon: RDaneil Dolin MD;  [facility];  Service: Endoscopy;  Laterality: N/A;  . SHOULDER OPEN ROTATOR CUFF REPAIR Left early 2000s  . small bowel capsule study  07/2011   ?transient focal ischemia in distal small bowel to explain GI bleeding  . TRANSTHORACIC ECHOCARDIOGRAM  09/2008   EF 60-65%, mod conc LVH  . TRANSURETHRAL RESECTION OF PROSTATE    . TRANSURETHRAL RESECTION OF PROSTATE N/A 06/18/2013   Procedure: TRANSURETHRAL RESECTION OF THE PROSTATE (TURP);  Surgeon: MMarissa Nestle MD;  Location: [facility];  Service: Urology;  Laterality: N/A;    Family History Family History  Problem Relation Age of Onset  . Colon cancer Father 620      deceased  . Prostate cancer Father   . Pancreatic cancer Mother 561      deceased  . Prostate cancer Brother   . Arthritis Son   .  Arthritis Son   . Diabetes Mellitus II Son      Social History  reports that he quit smoking about 44 years ago. His smoking use included cigars. He started smoking about 58 years ago. He quit after 14.00 years of use. He has never used smokeless tobacco. He reports that he does not drink alcohol or use drugs.  Medications  Current Outpatient Medications:  .  acetaminophen (TYLENOL) 325 MG tablet, Take 2 tablets (650 mg total) by mouth every 6 (six) hours as needed for mild pain (or Fever >/= 101)., Disp: , Rfl:  .  amoxicillin-clavulanate (AUGMENTIN) 875-125 MG tablet, Take 1 tablet by mouth 2 (two) times daily. One po bid x 7 days, Disp: 14 tablet, Rfl: 0 .  bisacodyl (DULCOLAX) 5 MG EC tablet, Take 1 tablet (5 mg total) by mouth daily., Disp: 30 tablet, Rfl: 0 .  brimonidine (ALPHAGAN) 0.2 % ophthalmic solution, Place 1 drop into both eyes 2 (two) times daily., Disp: , Rfl:  .  Cholecalciferol (VITAMIN D3) 1000 units CAPS, Take  1 capsule by mouth. Takes 3 times per week, Disp: , Rfl:  .  clopidogrel (PLAVIX) 75 MG tablet, Take 1 tablet (75 mg total) by mouth daily., Disp: 90 tablet, Rfl: 3 .  diclofenac sodium (VOLTAREN) 1 % GEL, Apply 4 g 4 (four) times daily as needed topically., Disp: 2 Tube, Rfl: 3 .  dorzolamide-timolol (COSOPT) 22.3-6.8 MG/ML ophthalmic solution, Place 1 drop into both eyes 2 (two) times daily., Disp: , Rfl:  .  ferrous sulfate 325 (65 FE) MG tablet, Take 325 mg by mouth daily with breakfast., Disp: , Rfl:  .  Ginger, Zingiber officinalis, (GINGER ROOT PO), Take by mouth as needed., Disp: , Rfl:  .  HYDROcodone-acetaminophen (NORCO/VICODIN) 5-325 MG tablet, Take 1-2 tablets by mouth every 6 (six) hours as needed., Disp: 12 tablet, Rfl: 0 .  latanoprost (XALATAN) 0.005 % ophthalmic solution, Place 1 drop into both eyes at bedtime., Disp: , Rfl:  .  Menthol, Topical Analgesic, (BIOFREEZE) 4 % GEL, To be used by therapy dept as needed, Disp: , Rfl:  .  metoprolol tartrate (LOPRESSOR) 25 MG tablet, Take 25 mg by mouth 2 (two) times daily., Disp: , Rfl:  .  ondansetron (ZOFRAN) 4 MG tablet, Take 4 mg by mouth as needed for nausea or vomiting., Disp: , Rfl:  .  Oxycodone HCl 10 MG TABS, Take 1 tablet (10 mg total) by mouth 2 (two) times daily as needed (pain)., Disp: 60 tablet, Rfl: 0 .  oxyCODONE-acetaminophen (PERCOCET) 10-325 MG tablet, Take 1 tablet by mouth every 4 (four) hours as needed., Disp: , Rfl: 0 .  Polyethyl Glycol-Propyl Glycol (LUBRICANT EYE DROPS) 0.4-0.3 % SOLN, Place 1 drop into both eyes daily as needed (for lubricant eye drops). , Disp: , Rfl:  .  potassium chloride (K-DUR,KLOR-CON) 10 MEQ tablet, Take 40 mEq by mouth 2 (two) times daily. , Disp: , Rfl:  .  sodium chloride (OCEAN) 0.65 % SOLN nasal spray, Place 1 spray into both nostrils as needed for congestion., Disp: , Rfl:  .  TURMERIC PO, Take by mouth 2 (two) times daily., Disp: , Rfl:   Allergies Aspirin; Iodinated diagnostic  agents; and Nexium [esomeprazole magnesium]  Review of Systems Review of Systems - Oncology ROS negative other than back pain   Physical Exam  Vitals Wt Readings from Last 3 Encounters:  02/09/18 273 lb (123.8 kg)  01/18/18 272 lb 9.6 oz (123.7 kg)  01/16/18 272  lb 9.6 oz (123.7 kg)   Temp Readings from Last 3 Encounters:  02/09/18 (!) 97.5 F (36.4 C) (Oral)  01/18/18 98.1 F (36.7 C) (Oral)  01/16/18 (!) 96.6 F (35.9 C) (Oral)   BP Readings from Last 3 Encounters:  02/09/18 (!) 160/67  01/18/18 (!) 157/84  01/16/18 (!) 149/78   Pulse Readings from Last 3 Encounters:  02/09/18 80  01/18/18 67  01/16/18 75   Constitutional: Well-developed, well-nourished, and in no distress.   HENT: Head: Normocephalic and atraumatic.  Mouth/Throat: No oropharyngeal exudate. Mucosa moist. Eyes: Pupils are equal, round, and reactive to light. Conjunctivae are normal. No scleral icterus.  Neck: Normal range of motion. Neck supple. No JVD present.  Cardiovascular: Normal rate, regular rhythm and normal heart sounds.  Exam reveals no gallop and no friction rub.   No murmur heard. Pulmonary/Chest: Effort normal and breath sounds normal. No respiratory distress. No wheezes.No rales.  Abdominal: Soft. Bowel sounds are normal. No distension. There is no tenderness. There is no guarding.  Musculoskeletal: Pt reports chronic back pain.  No percussion tenderness along spine.   Lymphadenopathy: No cervical, axillary or supraclavicular adenopathy.  Neurological: Alert and oriented to person, place, and time. No cranial nerve deficit.  Skin: Skin is warm and dry. No rash noted. No erythema. No pallor.  Psychiatric: Affect and judgment normal.   Labs No visits with results within 3 Day(s) from this visit.  Latest known visit with results is:  Appointment on 02/02/2018  Component Date Value Ref Range Status  . WBC 02/02/2018 10.6* 4.0 - 10.5 K/uL Final  . RBC 02/02/2018 5.71  4.22 - 5.81 MIL/uL  Final  . Hemoglobin 02/02/2018 15.1  13.0 - 17.0 g/dL Final  . HCT 02/02/2018 45.5  39.0 - 52.0 % Final  . MCV 02/02/2018 79.7  78.0 - 100.0 fL Final  . MCH 02/02/2018 26.4  26.0 - 34.0 pg Final  . MCHC 02/02/2018 33.2  30.0 - 36.0 g/dL Final  . RDW 02/02/2018 16.3* 11.5 - 15.5 % Final  . Platelets 02/02/2018 98* 150 - 400 K/uL Final   Comment: PLATELET COUNT CONFIRMED BY SMEAR SPECIMEN CHECKED FOR CLOTS   . Neutrophils Relative % 02/02/2018 82  % Final  . Neutro Abs 02/02/2018 8.8* 1.7 - 7.7 K/uL Final  . Lymphocytes Relative 02/02/2018 14  % Final  . Lymphs Abs 02/02/2018 1.5  0.7 - 4.0 K/uL Final  . Monocytes Relative 02/02/2018 3  % Final  . Monocytes Absolute 02/02/2018 0.3  0.1 - 1.0 K/uL Final  . Eosinophils Relative 02/02/2018 1  % Final  . Eosinophils Absolute 02/02/2018 0.1  0.0 - 0.7 K/uL Final  . Basophils Relative 02/02/2018 0  % Final  . Basophils Absolute 02/02/2018 0.0  0.0 - 0.1 K/uL Final   Performed at [facility], [address] [place], [place] [address]  . Sodium 02/02/2018 140  135 - 145 mmol/L Final  . Potassium 02/02/2018 4.2  3.5 - 5.1 mmol/L Final  . Chloride 02/02/2018 107  98 - 111 mmol/L Final  . CO2 02/02/2018 26  22 - 32 mmol/L Final  . Glucose, Bld 02/02/2018 145* 70 - 99 mg/dL Final  . BUN 02/02/2018 15  8 - 23 mg/dL Final  . Creatinine, Ser 02/02/2018 1.16  0.61 - 1.24 mg/dL Final  . Calcium 02/02/2018 9.2  8.9 - 10.3 mg/dL Final  . Total Protein 02/02/2018 6.4* 6.5 - 8.1 g/dL Final  . Albumin 02/02/2018 3.6  3.5 - 5.0  g/dL Final  . AST 02/02/2018 13* 15 - 41 U/L Final  . ALT 02/02/2018 11  0 - 44 U/L Final  . Alkaline Phosphatase 02/02/2018 60  38 - 126 U/L Final  . Total Bilirubin 02/02/2018 1.0  0.3 - 1.2 mg/dL Final  . GFR calc non Af Amer 02/02/2018 58* >60 mL/min Final  . GFR calc Af Amer 02/02/2018 >60  >60 mL/min Final   Comment: (NOTE) The eGFR has been calculated using the CKD EPI equation. This calculation has not been validated  in all clinical situations. eGFR's persistently <60 mL/min signify possible Chronic Kidney Disease.   Georgiann Hahn gap 02/02/2018 7  5 - 15 Final   Performed at [facility], [address] [place], [place] [address]  . Ferritin 02/02/2018 54  24 - 336 ng/mL Final   Performed at [facility], [address] [place], [place] [address]  . Iron 02/02/2018 84  45 - 182 ug/dL Final  . TIBC 02/02/2018 237* 250 - 450 ug/dL Final  . Saturation Ratios 02/02/2018 35  17.9 - 39.5 % Final  . UIBC 02/02/2018 153  ug/dL Final   Performed at [facility], [address], [place], [place] [address]     Pathology Orders Placed This Encounter  Procedures  . CT ABDOMEN PELVIS W CONTRAST    Standing Status:   Future    Standing Expiration Date:   02/09/2019    Order Specific Question:   If indicated for the ordered procedure, I authorize the administration of contrast media per Radiology protocol    Answer:   Yes    Order Specific Question:   Preferred imaging location?    Answer:   [facility]    Order Specific Question:   Is Oral Contrast requested for this exam?    Answer:   Yes, Per Radiology protocol    Order Specific Question:   Radiology Contrast Protocol - do NOT remove file path    Answer:   \\[email or web address]  . MR Lumbar Spine W Wo Contrast    Standing Status:   Future    Standing Expiration Date:   02/09/2019    Order Specific Question:   If indicated for the ordered procedure, I authorize the administration of contrast media per Radiology protocol    Answer:   Yes    Order Specific Question:   What is the patient's sedation requirement?    Answer:   No Sedation    Order Specific Question:   Does the patient have a pacemaker or implanted devices?    Answer:   No    Order Specific Question:   Use SRS Protocol?    Answer:   No    Order Specific Question:   Radiology Contrast Protocol - do NOT remove file path    Answer:    \\[email or web address]    Order Specific Question:   Preferred imaging location?    Answer:   [facility] (table limit-350lbs)  . MR PELVIS W WO CONTRAST    Standing Status:   Future    Standing Expiration Date:   02/09/2019    Order Specific Question:   If indicated for the ordered procedure, I authorize the administration of contrast media per Radiology protocol    Answer:   Yes    Order Specific Question:   What is the patient's sedation requirement?    Answer:   No Sedation    Order Specific Question:   Does the patient have a  pacemaker or implanted devices?    Answer:   No    Order Specific Question:   Radiology Contrast Protocol - do NOT remove file path    Answer:   \\[email or web address]    Order Specific Question:   Preferred imaging location?    Answer:   [facility] (table limit-350lbs)  . CBC with Differential/Platelet    Standing Status:   Future    Standing Expiration Date:   02/10/2019  . Comprehensive metabolic panel    Standing Status:   Future    Standing Expiration Date:   02/10/2019  . Lactate dehydrogenase    Standing Status:   Future    Standing Expiration Date:   02/10/2019  . Protein electrophoresis, serum    Standing Status:   Future    Standing Expiration Date:   02/10/2019  . C-reactive protein    Standing Status:   Future    Standing Expiration Date:   02/10/2019  . Rheumatoid factor    Standing Status:   Future    Standing Expiration Date:   02/10/2019  . Sedimentation rate    Standing Status:   Future    Standing Expiration Date:   02/10/2019       Zoila Shutter MD

## 2018-02-11 ENCOUNTER — Other Ambulatory Visit: Payer: Self-pay

## 2018-02-11 ENCOUNTER — Emergency Department (HOSPITAL_COMMUNITY)
Admission: EM | Admit: 2018-02-11 | Discharge: 2018-02-11 | Disposition: A | Discharge status: Home / Self Care | Payer: Medicare HMO | Attending: Emergency Medicine | Admitting: Emergency Medicine

## 2018-02-11 ENCOUNTER — Encounter (HOSPITAL_COMMUNITY): Payer: Self-pay | Admitting: Emergency Medicine

## 2018-02-11 ENCOUNTER — Emergency Department (HOSPITAL_COMMUNITY): Payer: Medicare HMO

## 2018-02-11 DIAGNOSIS — R0602 Shortness of breath: Secondary | ICD-10-CM | POA: Diagnosis not present

## 2018-02-11 DIAGNOSIS — I5032 Chronic diastolic (congestive) heart failure: Secondary | ICD-10-CM | POA: Insufficient documentation

## 2018-02-11 DIAGNOSIS — Z79899 Other long term (current) drug therapy: Secondary | ICD-10-CM | POA: Insufficient documentation

## 2018-02-11 DIAGNOSIS — I110 Hypertensive heart disease with heart failure: Secondary | ICD-10-CM | POA: Diagnosis not present

## 2018-02-11 DIAGNOSIS — I493 Ventricular premature depolarization: Secondary | ICD-10-CM | POA: Diagnosis not present

## 2018-02-11 DIAGNOSIS — I2510 Atherosclerotic heart disease of native coronary artery without angina pectoris: Secondary | ICD-10-CM | POA: Insufficient documentation

## 2018-02-11 DIAGNOSIS — E039 Hypothyroidism, unspecified: Secondary | ICD-10-CM | POA: Diagnosis not present

## 2018-02-11 DIAGNOSIS — M5412 Radiculopathy, cervical region: Secondary | ICD-10-CM | POA: Diagnosis not present

## 2018-02-11 DIAGNOSIS — Z87891 Personal history of nicotine dependence: Secondary | ICD-10-CM | POA: Insufficient documentation

## 2018-02-11 DIAGNOSIS — Z7902 Long term (current) use of antithrombotics/antiplatelets: Secondary | ICD-10-CM | POA: Diagnosis not present

## 2018-02-11 DIAGNOSIS — E785 Hyperlipidemia, unspecified: Secondary | ICD-10-CM | POA: Diagnosis not present

## 2018-02-11 DIAGNOSIS — Z8673 Personal history of transient ischemic attack (TIA), and cerebral infarction without residual deficits: Secondary | ICD-10-CM | POA: Diagnosis not present

## 2018-02-11 DIAGNOSIS — M545 Low back pain: Secondary | ICD-10-CM | POA: Diagnosis present

## 2018-02-11 LAB — CBC WITH DIFFERENTIAL/PLATELET
BASOS ABS: 0.1 10*3/uL (ref 0.0–0.1)
Basophils Relative: 1 %
EOS PCT: 1 %
Eosinophils Absolute: 0.1 10*3/uL (ref 0.0–0.7)
HEMATOCRIT: 45.9 % (ref 39.0–52.0)
Hemoglobin: 15.5 g/dL (ref 13.0–17.0)
LYMPHS PCT: 8 %
Lymphs Abs: 0.8 10*3/uL (ref 0.7–4.0)
MCH: 26.9 pg (ref 26.0–34.0)
MCHC: 33.8 g/dL (ref 30.0–36.0)
MCV: 79.7 fL (ref 78.0–100.0)
MONO ABS: 1.1 10*3/uL — AB (ref 0.1–1.0)
MONOS PCT: 12 %
Neutro Abs: 7.5 10*3/uL (ref 1.7–7.7)
Neutrophils Relative %: 78 %
PLATELETS: 105 10*3/uL — AB (ref 150–400)
RBC: 5.76 MIL/uL (ref 4.22–5.81)
RDW: 16.5 % — AB (ref 11.5–15.5)
WBC: 9.6 10*3/uL (ref 4.0–10.5)

## 2018-02-11 LAB — BASIC METABOLIC PANEL
ANION GAP: 8 (ref 5–15)
BUN: 13 mg/dL (ref 8–23)
CALCIUM: 9.3 mg/dL (ref 8.9–10.3)
CO2: 25 mmol/L (ref 22–32)
CREATININE: 1.03 mg/dL (ref 0.61–1.24)
Chloride: 107 mmol/L (ref 98–111)
GFR calc Af Amer: >60 mL/min (ref >60)
GLUCOSE: 101 mg/dL — AB (ref 70–99)
Potassium: 3.9 mmol/L (ref 3.5–5.1)
Sodium: 140 mmol/L (ref 135–145)

## 2018-02-11 MED ORDER — HYDROMORPHONE HCL 1 MG/ML IJ SOLN
0.5000 mg | Freq: Once | INTRAMUSCULAR | Status: AC
  Administered 2018-02-11: 0.5 mg via INTRAVENOUS
  Filled 2018-02-11: qty 1

## 2018-02-11 MED ORDER — HYDROCODONE-ACETAMINOPHEN 5-325 MG PO TABS: 1.0000 | ORAL_TABLET | Freq: Four times a day (QID) | ORAL | 0 refills | Status: DC | PRN

## 2018-02-11 MED ORDER — METHOCARBAMOL 500 MG PO TABS
1000.0000 mg | ORAL_TABLET | Freq: Once | ORAL | Status: AC
  Administered 2018-02-11: 1000 mg via ORAL
  Filled 2018-02-11: qty 2

## 2018-02-11 MED ORDER — PREDNISONE 20 MG PO TABS: 40.0000 mg | ORAL_TABLET | Freq: Every day | ORAL | 0 refills | Status: DC

## 2018-02-11 MED ORDER — FENTANYL CITRATE (PF) 100 MCG/2ML IJ SOLN
50.0000 ug | Freq: Once | INTRAMUSCULAR | Status: AC
  Administered 2018-02-11: 50 ug via INTRAVENOUS
  Filled 2018-02-11: qty 2

## 2018-02-11 MED ORDER — DEXAMETHASONE SODIUM PHOSPHATE 4 MG/ML IJ SOLN
8.0000 mg | Freq: Once | INTRAMUSCULAR | Status: AC
  Administered 2018-02-11: 8 mg via INTRAVENOUS
  Filled 2018-02-11: qty 2

## 2018-02-11 NOTE — ED Triage Notes (Signed)
Pt reports back pain in between shoulder blades. Pt reports pain radiates to right extremities with intermittent numbness. Pt reports history of back surgery.

## 2018-02-11 NOTE — Discharge Instructions (Addendum)
I have spoken with Dr. Vertell Limber, 1 of the neurosurgeons at your practice about your condition, he agrees that you need to be seen this week by your doctor or 1 of the doctors at that practice.  Please call the office in the morning and let them know about this, you will need to be seen in the office early in the week and you will probably need an MRI.  If in the meantime you develop increasing pain in your neck, your arm or weakness or numbness in your arm that is getting worse she should go immediately to the [facility] emergency department.  You would need emergent MRI if that was to happen.   In the meantime please take prednisone, 40 mg daily for 5 days, ibuprofen every 8 hours as needed and hydrocodone tablets which I have prescribed which she may take 2 tablets every 6 hours as needed only for severe pain.

## 2018-02-11 NOTE — ED Provider Notes (Signed)
[facility] EMERGENCY DEPARTMENT Provider Note   CSN: [id number] Arrival date & time: 02/11/18  7846     History   Chief Complaint Chief Complaint  Patient presents with  . Back Pain    HPI [name] [name]. is a 77 y.o. male.  HPI  Patient is a 77 year old male, he is followed by neurosurgery because of chronic back pain and states that within the last several weeks he actually had a procedure where he had burning of his nerves in his lower back to help with some of his chronic back and leg pain, he states that that seemed to help but over the last several days has developed some pain in his right mid back just underneath his scapula which seems to come on intermittently, feels like a sharp and stabbing pain and when it does happen it radiates up into his neck and down into his arm.  He states that his pain is currently 10 out of 10, severe, seems to be worse with movement especially trying to change positions, sit up, walk and states that the pain when it comes on does take his breath away though he has had no fevers coughing or swelling of his legs out of the normal.  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  The patient also has a history of anemia which has been chronic for which they are following.  The patient denies fevers chills nausea vomiting or difficulties with urination or constipation.  He has had no coughing shortness of breath or fevers other than the shortness of breath he gets when the pain comes on.  The pain seems to be constant but it is worse with certain positions of the arm or the back.  His symptoms seem to be getting worse over the last  couple of days and he reports that this morning when he tried to turn the light on, his bedside table lamp he was unable to turn the knob because of abnormal sensation and a feeling like his fingers would not work right.  Past Medical History:  Diagnosis Date  . Assistance needed for mobility    cane, walker  . Bilateral lower leg cellulitis   . BPH (benign prostatic hyperplasia)   . Cataract    left  . Chronic diastolic HF (heart failure) ([place])   . Chronic low back pain   . Collagen vascular disease ([place])    history of venous insufficency and LE cellulitis  . Complication of anesthesia    2012 due to sleep apnea pt has had difficulty being put under  and waking up from anesthesia  . Deep venous insufficiency   . GERD (gastroesophageal reflux disease)   . Glaucoma    left  . Headache   . Heel spur    right heel  . History of bladder infections   . History of colonic polyps   . Hyperlipidemia   . Hypertension    Sees Dr. Debara Pickett  . Hypothyroidism    hypothyroid denies no rx  . Iron deficiency 07/07/2016  . Kidney stones    passed them  . Obesity   .  Osteoarthritis   . PONV (postoperative nausea and vomiting)   . PUD (peptic ulcer disease)   . Ringing in ears   . S/P colonoscopy 2007, Feb and March 2012   hx of adenomas, left-sided diverticula, On 07/2010 TCS, fresh blood and clot noted coming from TI.  . S/P endoscopy 2007, 2012   2007: nl, May 2012: antral erosions  . SIRS (systemic inflammatory response syndrome) ([place]) 02/27/2012  . Sleep apnea    supposed to see Dr. Luan Pulling to get it started back, hasn't been on in since 2018  . Thrombocytopenia ([place]) 12/30/2011   Stable    Patient Active Problem List   Diagnosis Date Noted  . Abdominal pain 12/20/2017  . Diverticulitis of colon 06/28/2017  . Gastrointestinal hemorrhage with melena 05/27/2017  . Acute cystitis 05/27/2017  . Pelvic mass in male 05/27/2017  . Acute diverticulitis 05/27/2017  . Heme positive stool  05/27/2017  . Prostatitis 05/27/2017  . S/P colonoscopy 05/27/2017  . Bilateral primary osteoarthritis of knee 04/06/2017  . Weakness 02/27/2017  . Ventral hernia 02/27/2017  . Acute CVA (cerebrovascular accident) ([place]) 09/13/2016  . 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 Syncope 09/12/2016  . Iron deficiency 07/07/2016  . Back pain 03/22/2016  . Acute right-sided low back pain with right-sided sciatica   . S/P nasal septoplasty 10/07/2015  . Parotitis, acute 03/06/2015  . Nausea without vomiting 12/08/2014  . Dysphagia, pharyngoesophageal phase   . History of colonic polyps   . Hypokalemia 09/01/2013  . Dehydration 09/01/2013  . Leukocytosis 09/01/2013  . Diastolic dysfunction by echo Dec 2014 07/30/2013  . Morbid obesity- BMI 44.5 07/30/2013  . Edema of both legs 07/30/2013  . Bilateral lower leg cellulitis 07/18/2013  . 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 OSA (obstructive sleep apnea) 11/27/2012  . Other spondylosis with radiculopathy, lumbar region 08/22/2012  . Bright red blood per rectum 06/15/2012  . Constipation 02/29/2012  . SIRS (systemic inflammatory response syndrome) ([place]) 02/27/2012  . Hyperlipidemia 02/27/2012  . Thrombocytopenia ([place]) 12/30/2011  . Hypothyroidism 10/27/2011  . Upper abdominal pain 10/27/2011  . Rectal bleeding 11/16/2010  . DIARRHEA 06/08/2010  . COLONIC POLYPS, ADENOMATOUS, HX OF 07/16/2009  . Essential hypertension 06/19/2009  . GASTROESOPHAGEAL REFLUX DISEASE, CHRONIC 06/19/2009  . FATTY LIVER DISEASE 06/19/2009  . CHOLELITHIASIS, SYMPTOMATIC 06/19/2009  . ABDOMINAL BLOATING 06/19/2009    Past Surgical History:  Procedure Laterality Date  . BACK SURGERY    . BACK SURGERY  12/12/2017  . CARDIAC CATHETERIZATION  02/27/2007   no sign of CAD, LVH, mod pulm HTN (Dr. Jackie Plum(  . CATARACT EXTRACTION W/ INTRAOCULAR LENS IMPLANT Right ~ 2012  . COLONOSCOPY N/A 05/28/2014   WJX:BJYNWGNF colonic polyps/colonic diverticulosis. Tubular  adenomas. Next colonoscopy January 2021  . ESOPHAGOGASTRODUODENOSCOPY  Aug 2013   [facility], single 35mm pedunculated polyp otherwise normal. HYPERPLASTIC, NEGATIVE h.pylori  . ESOPHAGOGASTRODUODENOSCOPY N/A 05/28/2014   RMR:non-critical Schatzki's ring/HH. Benign gastritis  . ESOPHAGOGASTRODUODENOSCOPY (EGD) WITH PROPOFOL N/A 01/18/2018   Procedure: ESOPHAGOGASTRODUODENOSCOPY (EGD) WITH PROPOFOL;  Surgeon: Daneil Dolin, MD;  Location: [facility];  Service: Endoscopy;  Laterality: N/A;  1:45pm  . INGUINAL HERNIA REPAIR Right 1941  . KNEE ARTHROSCOPY Left ~ 2000  . LAPAROSCOPIC CHOLECYSTECTOMY    . [place] SURGERY  2012  . MALONEY DILATION N/A 05/28/2014   Procedure: Venia Minks DILATION;  Surgeon: Daneil Dolin, MD;  Location: [facility];  Service: Endoscopy;  Laterality: N/A;  . NASAL SEPTOPLASTY W/ TURBINOPLASTY  Bilateral 10/07/2015   Procedure: NASAL SEPTOPLASTY WITH TURBINATE REDUCTION;  Surgeon: Leta Baptist, MD;  Location: [place];  Service: ENT;  Laterality: Bilateral;  . NASAL SEPTUM SURGERY Bilateral 10/07/2015    inferior turbinate resection  . NM MYOCAR PERF WALL MOTION  12/2006   dipyridamole myoview - stress images show mild perfusion defect in mid inferior walls with reversibility at rest; mild perfusion defect in mid inferolateral wall at stress with mild defect reversibility, EF 62%, abnormal but low risk study   . POSTERIOR LUMBAR FUSION  2015  . SAVORY DILATION N/A 05/28/2014   Procedure: SAVORY DILATION;  Surgeon: Daneil Dolin, MD;  Location: [facility];  Service: Endoscopy;  Laterality: N/A;  . SHOULDER OPEN ROTATOR CUFF REPAIR Left early 2000s  . small bowel capsule study  07/2011   ?transient focal ischemia in distal small bowel to explain GI bleeding  . TRANSTHORACIC ECHOCARDIOGRAM  09/2008   EF 60-65%, mod conc LVH  . TRANSURETHRAL RESECTION OF PROSTATE    . TRANSURETHRAL RESECTION OF PROSTATE N/A 06/18/2013   Procedure: TRANSURETHRAL RESECTION OF THE PROSTATE (TURP);   Surgeon: Marissa Nestle, MD;  Location: [facility];  Service: Urology;  Laterality: N/A;        Home Medications    Prior to Admission medications   Medication Sig Start Date End Date Taking? Authorizing Provider  acetaminophen (TYLENOL) 325 MG tablet Take 2 tablets (650 mg total) by mouth every 6 (six) hours as needed for mild pain (or Fever >/= 101). 06/01/17   Debbe Odea, MD  amoxicillin-clavulanate (AUGMENTIN) 875-125 MG tablet Take 1 tablet by mouth 2 (two) times daily. One po bid x 7 days 12/31/17   Ripley Fraise, MD  bisacodyl (DULCOLAX) 5 MG EC tablet Take 1 tablet (5 mg total) by mouth daily. 03/26/16   Thurnell Lose, MD  brimonidine (ALPHAGAN) 0.2 % ophthalmic solution Place 1 drop into both eyes 2 (two) times daily.    [provider]  Cholecalciferol (VITAMIN D3) 1000 units CAPS Take 1 capsule by mouth. Takes 3 times per week    [provider]  clopidogrel (PLAVIX) 75 MG tablet Take 1 tablet (75 mg total) by mouth daily. 11/10/16   Lendon Colonel, NP  diclofenac sodium (VOLTAREN) 1 % GEL Apply 4 g 4 (four) times daily as needed topically. 04/06/17   McKeag, Marylynn Pearson, MD  dorzolamide-timolol (COSOPT) 22.3-6.8 MG/ML ophthalmic solution Place 1 drop into both eyes 2 (two) times daily. 12/17/14   [provider]  ferrous sulfate 325 (65 FE) MG tablet Take 325 mg by mouth daily with breakfast.    [provider]  Ginger, Zingiber officinalis, (GINGER ROOT PO) Take by mouth as needed.    [provider]  HYDROcodone-acetaminophen (NORCO/VICODIN) 5-325 MG tablet Take 1-2 tablets by mouth every 6 (six) hours as needed. 02/11/18   Noemi Chapel, MD  latanoprost (XALATAN) 0.005 % ophthalmic solution Place 1 drop into both eyes at bedtime.    [provider]  Menthol, Topical Analgesic, (BIOFREEZE) 4 % GEL To be used by therapy dept as needed    [provider]  metoprolol tartrate (LOPRESSOR) 25 MG tablet Take 25 mg by  mouth 2 (two) times daily.    [provider]  ondansetron (ZOFRAN) 4 MG tablet Take 4 mg by mouth as needed for nausea or vomiting.    [provider]  Polyethyl Glycol-Propyl Glycol (LUBRICANT EYE DROPS) 0.4-0.3 % SOLN Place 1 drop into both  eyes daily as needed (for lubricant eye drops).     [provider]  potassium chloride (K-DUR,KLOR-CON) 10 MEQ tablet Take 40 mEq by mouth 2 (two) times daily.     [provider]  predniSONE (DELTASONE) 20 MG tablet Take 2 tablets (40 mg total) by mouth daily. 02/11/18   Noemi Chapel, MD  sodium chloride (OCEAN) 0.65 % SOLN nasal spray Place 1 spray into both nostrils as needed for congestion.    [provider]  TURMERIC PO Take by mouth 2 (two) times daily.    [provider]    Family History Family History  Problem Relation Age of Onset  . Colon cancer Father 42       deceased  . Prostate cancer Father   . Pancreatic cancer Mother 66       deceased  . Prostate cancer Brother   . Arthritis Son   . Arthritis Son   . Diabetes Mellitus II Son     Social History Social History   Tobacco Use  . Smoking status: Former Smoker    Years: 14.00    Types: Cigars    Start date: 11/20/1959    Last attempt to quit: 08/10/1973    Years since quitting: 44.5  . Smokeless tobacco: Never Used  Substance Use Topics  . Alcohol use: No    Alcohol/week: 0.0 standard drinks    Comment: denies  . Drug use: No     Allergies   Aspirin; Iodinated diagnostic agents; and Nexium [esomeprazole magnesium]   Review of Systems Review of Systems  All other systems reviewed and are negative.    Physical Exam Updated Vital Signs BP 129/80   Pulse 73   Temp 97.9 F (36.6 C) (Oral)   Resp 11   Ht 1.676 m (5\' 6" )   Wt 121.6 kg   SpO2 94%   BMI 43.26 kg/m   Physical Exam  Constitutional: He appears well-developed and well-nourished. No distress.  HENT:  Head: Normocephalic and atraumatic.    Mouth/Throat: Oropharynx is clear and moist. No oropharyngeal exudate.  Eyes: Pupils are equal, round, and reactive to light. Conjunctivae and EOM are normal. Right eye exhibits no discharge. Left eye exhibits no discharge. No scleral icterus.  Neck: Normal range of motion. Neck supple. No JVD present. No thyromegaly present.  Cardiovascular: Normal rate, regular rhythm, normal heart sounds and intact distal pulses. Exam reveals no gallop and no friction rub.  No murmur heard. Pulmonary/Chest: Effort normal and breath sounds normal. No respiratory distress. He has no wheezes. He has no rales.  Abdominal: Soft. Bowel sounds are normal. He exhibits no distension and no mass. There is no tenderness.  Musculoskeletal: Normal range of motion. He exhibits tenderness. He exhibits no edema.  Patient has tenderness to palpation over the right back medial to the scapula, through the rhomboid and down into the lower back.  This is reproducible with palpation, when the patient tries to sit up in the bed he has pain in this area that becomes more intense.  He has no tenderness to palpation over his arms or legs.  There is tenderness in the right side of the neck as well.  Lymphadenopathy:    He has no cervical adenopathy.  Neurological: He is alert. Coordination normal.  The patient is able to straight leg raise bilaterally, he is able to lift both arms bilaterally, he has normal strength in the right and left hands however he states that he feels like  his grip is a little weak on the right and when he tries to open his hand his fingers go into an abnormal position into a claw like position which she states he has to manually open up.  He did demonstrate this on exam  Skin: Skin is warm and dry. No rash noted. No erythema.  Psychiatric: He has a normal mood and affect. His behavior is normal.  Nursing note and vitals reviewed.    ED Treatments / Results  Labs (all labs ordered are listed, but only abnormal  results are displayed) Labs Reviewed  CBC WITH DIFFERENTIAL/PLATELET - Abnormal; Notable for the following components:      Result Value   RDW 16.5 (*)    Platelets 105 (*)    Monocytes Absolute 1.1 (*)    All other components within normal limits  BASIC METABOLIC PANEL - Abnormal; Notable for the following components:   Glucose, Bld 101 (*)    All other components within normal limits    EKG EKG Interpretation  Date/Time:  Sunday February 11 2018 09:45:54 EDT Ventricular Rate:  76 PR Interval:    QRS Duration: 87 QT Interval:  406 QTC Calculation: 451 R Axis:   -23 Text Interpretation:  Sinus rhythm Atrial premature complex Prolonged PR interval Borderline left axis deviation Low voltage, precordial leads Abnormal R-wave progression, early transition Baseline wander in lead(s) III since last tracing no significant change Confirmed by Noemi Chapel [phone]) on 02/11/2018 10:46:40 AM   Radiology Dg Chest Port 1 View  Result Date: 02/11/2018 CLINICAL DATA:  Shortness of breath with back pain today. EXAM: PORTABLE CHEST 1 VIEW COMPARISON:  12/31/2017 and 05/30/2017 radiographs. FINDINGS: 1027 hours. There are slightly lower lung volumes with mild resulting bibasilar atelectasis. The lungs are otherwise clear. The heart size and mediastinal contours are stable. There is mild aortic atherosclerosis. The bones appear unremarkable. Telemetry leads overlie the chest. IMPRESSION: Slightly lower lung volumes with resulting mild bibasilar atelectasis. No other significant changes. Electronically Signed   By: Richardean Sale M.D.   On: 02/11/2018 11:00    Procedures Procedures (including critical care time)  Medications Ordered in ED Medications  dexamethasone (DECADRON) injection 8 mg (8 mg Intravenous Given 02/11/18 1056)  fentaNYL (SUBLIMAZE) injection 50 mcg (50 mcg Intravenous Given 02/11/18 1056)  methocarbamol (ROBAXIN) tablet 1,000 mg (1,000 mg Oral Given 02/11/18 1203)  HYDROmorphone  (DILAUDID) injection 0.5 mg (0.5 mg Intravenous Given 02/11/18 1204)     Initial Impression / Assessment and Plan / ED Course  I have reviewed the triage vital signs and the nursing notes.  Pertinent labs & imaging results that were available during my care of the patient were reviewed by me and considered in my medical decision making (see chart for details).  Clinical Course as of Feb 11 1234  Sun Feb 11, 2018  1131 X-ray unremarkable, labs unremarkable, will discuss with neurosurgery   [BM]    Clinical Course User Index [BM] Noemi Chapel, MD   The patient's hands does have an abnormal appearance almost into a swan-neck type deformity of his third fourth fingers when he tries to open and close his hand.  He is able to grip though he is slightly weak on the right side.  I am concerned because of his abnormal exam with right upper extremity neurologic symptoms.  In the presence of the pain that he is having it is suggestive that he would need further evaluation and potential MRI.  I will discuss with neurosurgery,  Labs reviewed, the patient has a chronic thrombocytopenia, this is no different today, he has no leukocytosis, no anemia, renal function is baseline, his electrolytes as well.  I discussed the case with Dr. Vertell Limber of the neurosurgery service who agrees that the patient's symptoms do reflect a likely radiculopathy and that he does need to be seen this week.  He recommends against transfer for emergent MRI and has agreed that the patient can be treated with pain medicine, steroid course and follow-up in the office this week.  I reevaluated the patient, he has improved with the medications given, he was given Decadron, fentanyl, his exam is unchanged with no increased weakness or numbness of the arm, he still has a good grip and is able to shake my hand with good strength.  He understands the instructions including the return precautions and pathologic signs of further neurologic  decline.  Stable for discharge at this time.  The son who was in the room also expresses his understanding to these detailed instructions.  Final Clinical Impressions(s) / ED Diagnoses   Final diagnoses:  Cervical radiculitis    ED Discharge Orders         Ordered    predniSONE (DELTASONE) 20 MG tablet  Daily     02/11/18 1235    HYDROcodone-acetaminophen (NORCO/VICODIN) 5-325 MG tablet  Every 6 hours PRN     02/11/18 1235           Noemi Chapel, MD 02/11/18 1235

## 2018-02-12 ENCOUNTER — Other Ambulatory Visit (HOSPITAL_COMMUNITY): Payer: Self-pay | Admitting: *Deleted

## 2018-02-12 MED ORDER — PREDNISONE 50 MG PO TABS: ORAL_TABLET | ORAL | 0 refills | Status: DC

## 2018-02-12 MED ORDER — DIPHENHYDRAMINE HCL 50 MG PO TABS: ORAL_TABLET | ORAL | 0 refills | Status: DC

## 2018-02-12 NOTE — Telephone Encounter (Signed)
Patient is allergic to contrast dye and has CT scheduled, premedication regimen called in per standing orders.

## 2018-02-13 DIAGNOSIS — R69 Illness, unspecified: Secondary | ICD-10-CM | POA: Diagnosis not present

## 2018-02-15 DIAGNOSIS — Z6841 Body Mass Index (BMI) 40.0 and over, adult: Secondary | ICD-10-CM | POA: Diagnosis not present

## 2018-02-15 DIAGNOSIS — M47816 Spondylosis without myelopathy or radiculopathy, lumbar region: Secondary | ICD-10-CM | POA: Diagnosis not present

## 2018-02-15 DIAGNOSIS — I10 Essential (primary) hypertension: Secondary | ICD-10-CM | POA: Diagnosis not present

## 2018-02-16 DIAGNOSIS — M5412 Radiculopathy, cervical region: Secondary | ICD-10-CM | POA: Diagnosis not present

## 2018-02-16 DIAGNOSIS — K219 Gastro-esophageal reflux disease without esophagitis: Secondary | ICD-10-CM | POA: Diagnosis not present

## 2018-02-16 DIAGNOSIS — I10 Essential (primary) hypertension: Secondary | ICD-10-CM | POA: Diagnosis not present

## 2018-02-16 DIAGNOSIS — E1129 Type 2 diabetes mellitus with other diabetic kidney complication: Secondary | ICD-10-CM | POA: Diagnosis not present

## 2018-02-16 DIAGNOSIS — Z6841 Body Mass Index (BMI) 40.0 and over, adult: Secondary | ICD-10-CM | POA: Diagnosis not present

## 2018-02-16 DIAGNOSIS — G459 Transient cerebral ischemic attack, unspecified: Secondary | ICD-10-CM | POA: Diagnosis not present

## 2018-02-21 ENCOUNTER — Ambulatory Visit (HOSPITAL_COMMUNITY)
Admission: RE | Admit: 2018-02-21 | Discharge: 2018-02-21 | Disposition: A | Discharge status: Home / Self Care | Payer: Medicare HMO | Source: Ambulatory Visit | Attending: Internal Medicine | Admitting: Internal Medicine

## 2018-02-21 ENCOUNTER — Ambulatory Visit (HOSPITAL_COMMUNITY): Admission: RE | Admit: 2018-02-21 | Payer: Medicare HMO | Source: Ambulatory Visit

## 2018-02-21 ENCOUNTER — Ambulatory Visit (HOSPITAL_COMMUNITY): Payer: Medicare HMO

## 2018-02-21 DIAGNOSIS — M545 Low back pain, unspecified: Secondary | ICD-10-CM

## 2018-02-21 DIAGNOSIS — R140 Abdominal distension (gaseous): Secondary | ICD-10-CM | POA: Insufficient documentation

## 2018-02-21 DIAGNOSIS — G8929 Other chronic pain: Secondary | ICD-10-CM | POA: Insufficient documentation

## 2018-02-21 DIAGNOSIS — R1909 Other intra-abdominal and pelvic swelling, mass and lump: Secondary | ICD-10-CM | POA: Diagnosis not present

## 2018-02-21 DIAGNOSIS — M48061 Spinal stenosis, lumbar region without neurogenic claudication: Secondary | ICD-10-CM | POA: Diagnosis not present

## 2018-02-21 DIAGNOSIS — Z981 Arthrodesis status: Secondary | ICD-10-CM | POA: Diagnosis not present

## 2018-02-21 DIAGNOSIS — M899 Disorder of bone, unspecified: Secondary | ICD-10-CM | POA: Diagnosis not present

## 2018-02-21 MED ORDER — GADOBUTROL 1 MMOL/ML IV SOLN
10.0000 mL | Freq: Once | INTRAVENOUS | Status: AC | PRN
  Administered 2018-02-21: 10 mL via INTRAVENOUS

## 2018-03-01 ENCOUNTER — Inpatient Hospital Stay (HOSPITAL_COMMUNITY): Payer: Medicare HMO | Attending: Hematology

## 2018-03-01 ENCOUNTER — Ambulatory Visit (HOSPITAL_COMMUNITY)
Admission: RE | Admit: 2018-03-01 | Discharge: 2018-03-01 | Disposition: A | Discharge status: Home / Self Care | Payer: Medicare HMO | Source: Ambulatory Visit | Attending: Internal Medicine | Admitting: Internal Medicine

## 2018-03-01 DIAGNOSIS — Z833 Family history of diabetes mellitus: Secondary | ICD-10-CM | POA: Insufficient documentation

## 2018-03-01 DIAGNOSIS — Z8042 Family history of malignant neoplasm of prostate: Secondary | ICD-10-CM | POA: Insufficient documentation

## 2018-03-01 DIAGNOSIS — K8689 Other specified diseases of pancreas: Secondary | ICD-10-CM | POA: Diagnosis not present

## 2018-03-01 DIAGNOSIS — Z79899 Other long term (current) drug therapy: Secondary | ICD-10-CM | POA: Diagnosis not present

## 2018-03-01 DIAGNOSIS — R140 Abdominal distension (gaseous): Secondary | ICD-10-CM

## 2018-03-01 DIAGNOSIS — Z800 Family history of malignant neoplasm of digestive organs: Secondary | ICD-10-CM | POA: Diagnosis not present

## 2018-03-01 DIAGNOSIS — D696 Thrombocytopenia, unspecified: Secondary | ICD-10-CM | POA: Insufficient documentation

## 2018-03-01 DIAGNOSIS — D72829 Elevated white blood cell count, unspecified: Secondary | ICD-10-CM | POA: Diagnosis not present

## 2018-03-01 DIAGNOSIS — R634 Abnormal weight loss: Secondary | ICD-10-CM

## 2018-03-01 DIAGNOSIS — Z87891 Personal history of nicotine dependence: Secondary | ICD-10-CM | POA: Insufficient documentation

## 2018-03-01 DIAGNOSIS — M545 Low back pain, unspecified: Secondary | ICD-10-CM

## 2018-03-01 DIAGNOSIS — G8929 Other chronic pain: Secondary | ICD-10-CM | POA: Diagnosis not present

## 2018-03-01 LAB — COMPREHENSIVE METABOLIC PANEL
ALBUMIN: 3.6 g/dL (ref 3.5–5.0)
ALK PHOS: 60 U/L (ref 38–126)
ALT: 11 U/L (ref 0–44)
ANION GAP: 7 (ref 5–15)
AST: 14 U/L — ABNORMAL LOW (ref 15–41)
BILIRUBIN TOTAL: 1.1 mg/dL (ref 0.3–1.2)
BUN: 13 mg/dL (ref 8–23)
CALCIUM: 9.5 mg/dL (ref 8.9–10.3)
CO2: 27 mmol/L (ref 22–32)
Chloride: 109 mmol/L (ref 98–111)
Creatinine, Ser: 1.06 mg/dL (ref 0.61–1.24)
GFR calc Af Amer: >60 mL/min (ref >60)
GLUCOSE: 115 mg/dL — AB (ref 70–99)
Potassium: 3.8 mmol/L (ref 3.5–5.1)
Sodium: 143 mmol/L (ref 135–145)
TOTAL PROTEIN: 6.4 g/dL — AB (ref 6.5–8.1)

## 2018-03-01 LAB — CBC WITH DIFFERENTIAL/PLATELET
Abs Immature Granulocytes: 0.41 10*3/uL — ABNORMAL HIGH (ref 0.00–0.07)
Basophils Absolute: 0 10*3/uL (ref 0.0–0.1)
Basophils Relative: 0 %
Eosinophils Absolute: 0.1 10*3/uL (ref 0.0–0.5)
Eosinophils Relative: 1 %
HEMATOCRIT: 46.7 % (ref 39.0–52.0)
HEMOGLOBIN: 14.8 g/dL (ref 13.0–17.0)
Immature Granulocytes: 4 %
LYMPHS ABS: 0.5 10*3/uL — AB (ref 0.7–4.0)
LYMPHS PCT: 6 %
MCH: 26.3 pg (ref 26.0–34.0)
MCHC: 31.7 g/dL (ref 30.0–36.0)
MCV: 82.9 fL (ref 80.0–100.0)
Monocytes Absolute: 0.8 10*3/uL (ref 0.1–1.0)
Monocytes Relative: 9 %
NRBC: 0 % (ref 0.0–0.2)
Neutro Abs: 7.6 10*3/uL (ref 1.7–7.7)
Neutrophils Relative %: 80 %
Platelets: 90 10*3/uL — ABNORMAL LOW (ref 150–400)
RBC: 5.63 MIL/uL (ref 4.22–5.81)
RDW: 17.4 % — ABNORMAL HIGH (ref 11.5–15.5)
WBC: 9.5 10*3/uL (ref 4.0–10.5)

## 2018-03-01 LAB — C-REACTIVE PROTEIN: CRP: 0.8 mg/dL (ref <1.0)

## 2018-03-01 LAB — SEDIMENTATION RATE: Sed Rate: 2 mm/hr (ref 0–16)

## 2018-03-01 LAB — LACTATE DEHYDROGENASE: LDH: 108 U/L (ref 98–192)

## 2018-03-01 MED ORDER — BARIUM SULFATE 2.1 % PO SUSP
ORAL | Status: AC
  Filled 2018-03-01: qty 2

## 2018-03-02 LAB — RHEUMATOID FACTOR: Rhuematoid fact SerPl-aCnc: <10 IU/mL (ref 0.0–13.9)

## 2018-03-02 LAB — PROTEIN ELECTROPHORESIS, SERUM
A/G RATIO SPE: 1.3 (ref 0.7–1.7)
ALPHA-1-GLOBULIN: 0.2 g/dL (ref 0.0–0.4)
ALPHA-2-GLOBULIN: 0.6 g/dL (ref 0.4–1.0)
Albumin ELP: 3.2 g/dL (ref 2.9–4.4)
BETA GLOBULIN: 0.8 g/dL (ref 0.7–1.3)
Gamma Globulin: 0.7 g/dL (ref 0.4–1.8)
Globulin, Total: 2.4 g/dL (ref 2.2–3.9)
Total Protein ELP: 5.6 g/dL — ABNORMAL LOW (ref 6.0–8.5)

## 2018-03-02 LAB — HEPATITIS B SURFACE ANTIGEN: HEP B S AG: NEGATIVE

## 2018-03-02 LAB — HEPATITIS PANEL, ACUTE
HCV AB: 0.1 {s_co_ratio} (ref 0.0–0.9)
HEP A IGM: NEGATIVE
HEP B C IGM: NEGATIVE
Hepatitis B Surface Ag: NEGATIVE

## 2018-03-02 LAB — HIV ANTIBODY (ROUTINE TESTING W REFLEX): HIV Screen 4th Generation wRfx: NONREACTIVE

## 2018-03-02 LAB — HEPATITIS B SURFACE ANTIBODY,QUALITATIVE: HEP B S AB: NONREACTIVE

## 2018-03-02 LAB — HEPATITIS B CORE ANTIBODY, TOTAL: Hep B Core Total Ab: NEGATIVE

## 2018-03-06 ENCOUNTER — Ambulatory Visit (HOSPITAL_COMMUNITY)
Admission: RE | Admit: 2018-03-06 | Discharge: 2018-03-06 | Disposition: A | Discharge status: Home / Self Care | Payer: Medicare HMO | Source: Ambulatory Visit | Attending: Internal Medicine | Admitting: Internal Medicine

## 2018-03-06 ENCOUNTER — Telehealth (HOSPITAL_COMMUNITY): Payer: Self-pay

## 2018-03-06 DIAGNOSIS — K869 Disease of pancreas, unspecified: Secondary | ICD-10-CM | POA: Diagnosis not present

## 2018-03-06 DIAGNOSIS — R140 Abdominal distension (gaseous): Secondary | ICD-10-CM | POA: Insufficient documentation

## 2018-03-06 DIAGNOSIS — R634 Abnormal weight loss: Secondary | ICD-10-CM | POA: Insufficient documentation

## 2018-03-06 DIAGNOSIS — R1030 Lower abdominal pain, unspecified: Secondary | ICD-10-CM | POA: Diagnosis not present

## 2018-03-06 DIAGNOSIS — R599 Enlarged lymph nodes, unspecified: Secondary | ICD-10-CM | POA: Diagnosis not present

## 2018-03-06 DIAGNOSIS — I700 Atherosclerosis of aorta: Secondary | ICD-10-CM | POA: Insufficient documentation

## 2018-03-06 MED ORDER — IOPAMIDOL (ISOVUE-300) INJECTION 61%
100.0000 mL | Freq: Once | INTRAVENOUS | Status: AC | PRN
  Administered 2018-03-06: 100 mL via INTRAVENOUS

## 2018-03-06 NOTE — Telephone Encounter (Signed)
[facility] called to give recent CT results for this pt. Results printed and given to Dr. Walden Field to review. Pt has an appt 10/17 to go over these results. Dr. Walden Field made aware of this.

## 2018-03-08 ENCOUNTER — Other Ambulatory Visit: Payer: Self-pay

## 2018-03-08 ENCOUNTER — Telehealth: Payer: Self-pay

## 2018-03-08 ENCOUNTER — Inpatient Hospital Stay (HOSPITAL_BASED_OUTPATIENT_CLINIC_OR_DEPARTMENT_OTHER): Payer: Medicare HMO | Admitting: Internal Medicine

## 2018-03-08 ENCOUNTER — Encounter (HOSPITAL_COMMUNITY): Payer: Self-pay | Admitting: Internal Medicine

## 2018-03-08 VITALS — BP 150/69 | HR 70 | Temp 98.0°F | Resp 16 | Wt 265.3 lb

## 2018-03-08 DIAGNOSIS — Z87891 Personal history of nicotine dependence: Secondary | ICD-10-CM

## 2018-03-08 DIAGNOSIS — K8689 Other specified diseases of pancreas: Secondary | ICD-10-CM

## 2018-03-08 DIAGNOSIS — G8929 Other chronic pain: Secondary | ICD-10-CM | POA: Diagnosis not present

## 2018-03-08 DIAGNOSIS — Z800 Family history of malignant neoplasm of digestive organs: Secondary | ICD-10-CM | POA: Diagnosis not present

## 2018-03-08 DIAGNOSIS — Z79899 Other long term (current) drug therapy: Secondary | ICD-10-CM

## 2018-03-08 DIAGNOSIS — D72829 Elevated white blood cell count, unspecified: Secondary | ICD-10-CM | POA: Diagnosis not present

## 2018-03-08 DIAGNOSIS — D696 Thrombocytopenia, unspecified: Secondary | ICD-10-CM | POA: Diagnosis not present

## 2018-03-08 DIAGNOSIS — Z8042 Family history of malignant neoplasm of prostate: Secondary | ICD-10-CM | POA: Diagnosis not present

## 2018-03-08 DIAGNOSIS — Z833 Family history of diabetes mellitus: Secondary | ICD-10-CM | POA: Diagnosis not present

## 2018-03-08 MED ORDER — HYDROCODONE-ACETAMINOPHEN 7.5-325 MG PO TABS: 1.0000 | ORAL_TABLET | Freq: Four times a day (QID) | ORAL | 0 refills | Status: DC | PRN

## 2018-03-08 NOTE — Telephone Encounter (Signed)
-----   Message from [name] Banister, MD sent at 03/08/2018 10:16 AM EDT ----- Regarding: RE: Dr Walden Field spoke with you Thanks.  If you could please let him know to expect a call from [name] about scheduling EUS with biopsy, we will get in touch.  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 Rush Landmark. I know I have availability on 10/31 still). The mass was present but not described on my review of 11/2017 CT as well, has clearly grown since then and appears to involve the SMV and SMA.  He is on plavix and needs to stop that for 5 days prior to the EUS. Need his PCP or cardiologist to comment on that recommendation.  Thanks DJ   ----- Message ----- From: [name] [name] Sent: 03/08/2018   9:55 AM EDT To: [name] Banister, MD, Zoila Shutter, MD Subject: Dr Walden Field spoke with you                        Dr Ardis Hughs, Dr Walden Field spoke withh you today about this pt.  Please review Ct scan in epic and schedule pt with you if needed per Dr Walden Field.  Thanks

## 2018-03-08 NOTE — H&P (View-Only) (Signed)
Diagnosis Thrombocytopenia ([place]) - Plan: CBC with Differential/Platelet, Comprehensive metabolic panel, Lactate dehydrogenase, Cancer antigen 19-9, CEA  Pancreatic mass - Plan: CBC with Differential/Platelet, Comprehensive metabolic panel, Lactate dehydrogenase, Cancer antigen 19-9, CEA  Staging Cancer Staging No matching staging information was found for the patient.  Assessment and Plan:  1. Back pain/Pancreatic mass.  Pt has longstanding history of back pain.  SPEP negative, Sed rate 2, RA < 10 all WNL.  He was set up for CT of the abdomen and pelvis that was done on 03/06/2018 that was reviewed and showed  IMPRESSION: 1. 4 cm mass involving the uncinate process of the pancreas, highly suspicious for primary pancreatic malignancy. 2. Mildly increased size of multiple regional lymph nodes suspicious for nodal metastases. 3.  Aortic Atherosclerosis (ICD10-I70.0).  I have spoken with Dr. Ardis Hughs today who will review scan for EUS.  Pt will RTC after diagnostic evaluation to go over pathology and discuss treatment options.  Will check CEA and CA 19-9 on RTC.   Pt given RX for Norco 7.5 mg po q 6 hrs prn pain # 60 with no refills today in clinic.    2.  Leukocytosis, neutrophilia.  Pt was followed by Dr. Talbert Cage.  He had previous evaluation with a negative BCR/ABL  and normal sed rate, C-reactive protein,.  He also had a negative Jak 2 evaluation.    Labs done 03/01/2018 reviewed and showed WBC 9.5 HB 14.8 plts 90,000.  Chemistries WNL with K+ 3.8 Cr 1 and normal LFTS.  Bilirubin is 1.1.  Work-up of elevated WBC is negative from hematological perspective.  Normal variant with only mild elevation noted.    2.  Thrombocytopenia.  Plt count stable at 90.000 on labs done 03/01/2018.  CT chest abdomen and pelvis done 03/06/2018 showed normal spleen and ? Liver hemagioma.   Hepatitis and HIV testing negative.   3.  Colon lesion.  CT OF ABDOMEN AND PELVIS DONE 05/27/2017 showed  IMPRESSION: 1. Focal  inflamed masslike area involving the cecum at the level of the terminal ileum. Findings may represent focal cecal diverticulitis, an infectious or inflammatory process or potentially cecal mass/neoplastic process. Recommend clinical and laboratory correlation. Additionally, patient will need further evaluation with colonoscopy after resolution of the acute symptomatology. 2. Mild wall thickening of the rectum, potentially sequelae of infectious process. 3. Wall thickening of the urinary bladder raising the possibility of cystitis. 4. Re- demonstrated slowly enlarging soft tissue mass within the presacral location. Recommend further evaluation with pelvic MRI. 5. Mild splenomegaly.  Was seen by GI and had EGD done 01/18/2018 that was negative.  Follow-up with GI as recommended.    4. Presacral mass.   MRI of the pelvis done 05/30/2017 shows:   IMPRESSION: 1. 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 Trace edema in the adjacent fascia plane with the perirectal space. There was some faint nodularity in this vicinity in 2015 but no abnormality in the area prior to that. The lesion has slowly enlarged and seems to have a potentially linear fatty element extending sagittally in the right paracentral position. Given the fatty element, possibilities may include myelolipoma, extramedullary hematopoiesis, solitary fibrous tumor, low-grade lymphoma, or soft tissue hemangioma. Given the slow growth and fatty elements this would be unusual for metastatic disease. Appearance not considered characteristic for chordoma and would be unusual for a neurogenic origin tumor such as schwannoma based on the intermediate T2 signal. Consider surveillance or  resection.  Pt has undergone several back surgeries.  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   Follow-up with neurosurgery as recommended.   Greater than 25  minutes spent with more than 50% spent in counseling and coordination of care.    Interval History:  Historical data obtained from note dated 08/09/2017.  77 year old male followed by Dr. Talbert Cage  for leukocytosis and thrombocytopenia.  He has undergone evaluation with normal BCR/ABL,  normal sed rate, C-reactive protein, negative Jak2.  Current Status: Patient is seen today for follow-up.  He is here to go over CT and MRI scans.  Pt continues to complain of  back pain.   Problem List Patient Active Problem List   Diagnosis Date Noted  . Abdominal pain [R10.9] 12/20/2017  . Diverticulitis of colon [K57.32] 06/28/2017  . Gastrointestinal hemorrhage with melena [K92.1] 05/27/2017  . Acute cystitis [N30.00] 05/27/2017  . Pelvic mass in male [R19.00] 05/27/2017  . Acute diverticulitis [K57.92] 05/27/2017  . Heme positive stool [R19.5] 05/27/2017  . Prostatitis [N41.9] 05/27/2017  . S/P colonoscopy [Z98.890] 05/27/2017  . Bilateral primary osteoarthritis of knee [M17.0] 04/06/2017  . Weakness [R53.1] 02/27/2017  . Ventral hernia [K43.9] 02/27/2017  . Acute CVA (cerebrovascular accident) ([place]) [I63.9] 09/13/2016  . Stroke (cerebrum) ([place]) [I63.9] 09/13/2016  . Syncope [R55] 09/12/2016  . Iron deficiency [E61.1] 07/07/2016  . Back pain [M54.9] 03/22/2016  . Acute right-sided low back pain with right-sided sciatica [M54.41]   . S/P nasal septoplasty [Z98.890] 10/07/2015  . Parotitis, acute [K11.21] 03/06/2015  . Nausea without vomiting [R11.0] 12/08/2014  . Dysphagia, pharyngoesophageal phase [R13.14]   . History of colonic polyps [Z86.010]   . Hypokalemia [E87.6] 09/01/2013  . Dehydration [E86.0] 09/01/2013  . Leukocytosis [D72.829] 09/01/2013  . Diastolic dysfunction by echo Dec 2014 [I51.89] 07/30/2013  . Morbid obesity- BMI 44.5 [E66.01] 07/30/2013  . Edema of both legs [R60.0] 07/30/2013  . Bilateral lower leg cellulitis  [L03.116, L03.115] 07/18/2013  . 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 OSA (obstructive sleep apnea) [G47.33] 11/27/2012  . Other spondylosis with radiculopathy, lumbar region [facility] 08/22/2012  . Bright red blood per rectum [K62.5] 06/15/2012  . Constipation [K59.00] 02/29/2012  . SIRS (systemic inflammatory response syndrome) ([facility]) [R65.10] 02/27/2012  . Hyperlipidemia [E78.5] 02/27/2012  . Thrombocytopenia ([place]) [D69.6] 12/30/2011  . Hypothyroidism [E03.9] 10/27/2011  . Upper abdominal pain [R10.10] 10/27/2011  . Rectal bleeding [K62.5] 11/16/2010  . DIARRHEA [R19.7] 06/08/2010  . COLONIC POLYPS, ADENOMATOUS, HX OF [Z86.010] 07/16/2009  . Essential hypertension [I10] 06/19/2009  . GASTROESOPHAGEAL REFLUX DISEASE, CHRONIC [K21.9] 06/19/2009  . FATTY LIVER DISEASE [K76.89] 06/19/2009  . CHOLELITHIASIS, SYMPTOMATIC [K80.20] 06/19/2009  . ABDOMINAL BLOATING [R14.3, R14.1, R14.2] 06/19/2009    Past Medical History Past Medical History:  Diagnosis Date  . Assistance needed for mobility    cane, walker  . Bilateral lower leg cellulitis   . BPH (benign prostatic hyperplasia)   . Cataract    left  . Chronic diastolic HF (heart failure) ([place])   . Chronic low back pain   . Collagen vascular disease ([place])    history of venous insufficency and LE cellulitis  . Complication of anesthesia    2012 due to sleep apnea pt has had difficulty being put under  and waking up from anesthesia  . Deep venous insufficiency   . GERD (gastroesophageal reflux disease)   . Glaucoma  left  . Headache   . Heel spur    right heel  . History of bladder infections   . History of colonic polyps   . Hyperlipidemia   . Hypertension    Sees Dr. Debara Pickett  . Hypothyroidism    hypothyroid denies no rx  . Iron deficiency 07/07/2016  . Kidney stones    passed them  . Obesity   . Osteoarthritis   . PONV (postoperative nausea and vomiting)   . PUD (peptic ulcer disease)   . Ringing in  ears   . S/P colonoscopy 2007, Feb and March 2012   hx of adenomas, left-sided diverticula, On 07/2010 TCS, fresh blood and clot noted coming from TI.  . S/P endoscopy 2007, 2012   2007: nl, May 2012: antral erosions  . SIRS (systemic inflammatory response syndrome) ([place]) 02/27/2012  . Sleep apnea    supposed to see Dr. Luan Pulling to get it started back, hasn't been on in since 2018  . Thrombocytopenia ([place]) 12/30/2011   Stable    Past Surgical History Past Surgical History:  Procedure Laterality Date  . BACK SURGERY    . BACK SURGERY  12/12/2017  . CARDIAC CATHETERIZATION  02/27/2007   no sign of CAD, LVH, mod pulm HTN (Dr. Jackie Plum(  . CATARACT EXTRACTION W/ INTRAOCULAR LENS IMPLANT Right ~ 2012  . COLONOSCOPY N/A 05/28/2014   ZJI:[id number] colonic polyps/colonic diverticulosis. Tubular adenomas. Next colonoscopy January 2021  . ESOPHAGOGASTRODUODENOSCOPY  Aug 2013   [facility], single 70m pedunculated polyp otherwise normal. HYPERPLASTIC, NEGATIVE h.pylori  . ESOPHAGOGASTRODUODENOSCOPY N/A 05/28/2014   RMR:non-critical Schatzki's ring/HH. Benign gastritis  . ESOPHAGOGASTRODUODENOSCOPY (EGD) WITH PROPOFOL N/A 01/18/2018   Procedure: ESOPHAGOGASTRODUODENOSCOPY (EGD) WITH PROPOFOL;  Surgeon: RDaneil Dolin MD;  [facility];  Service: Endoscopy;  Laterality: N/A;  1:45pm  . INGUINAL HERNIA REPAIR Right 1941  . KNEE ARTHROSCOPY Left ~ 2000  . LAPAROSCOPIC CHOLECYSTECTOMY    . [place]  . MALONEY DILATION N/A 05/28/2014   Procedure: MVenia MinksDILATION;  Surgeon: RDaneil Dolin MD;  Location: [facility];  Service: Endoscopy;  Laterality: N/A;  . NASAL SEPTOPLASTY W/ TURBINOPLASTY Bilateral 10/07/2015   Procedure: NASAL SEPTOPLASTY WITH TURBINATE REDUCTION;  Surgeon: SLeta Baptist MD;  [place]  Service: ENT;  Laterality: Bilateral;  . NASAL SEPTUM SURGERY Bilateral 10/07/2015    inferior turbinate resection  . NM MYOCAR PERF WALL MOTION  12/2006   dipyridamole myoview -  stress images show mild perfusion defect in mid inferior walls with reversibility at rest; mild perfusion defect in mid inferolateral wall at stress with mild defect reversibility, EF 62%, abnormal but low risk study   . POSTERIOR LUMBAR FUSION  2015  . SAVORY DILATION N/A 05/28/2014   Procedure: SAVORY DILATION;  Surgeon: RDaneil Dolin MD;  [facility];  Service: Endoscopy;  Laterality: N/A;  . SHOULDER OPEN ROTATOR CUFF REPAIR Left early 2000s  . small bowel capsule study  07/2011   ?transient focal ischemia in distal small bowel to explain GI bleeding  . TRANSTHORACIC ECHOCARDIOGRAM  09/2008   EF 60-65%, mod conc LVH  . TRANSURETHRAL RESECTION OF PROSTATE    . TRANSURETHRAL RESECTION OF PROSTATE N/A 06/18/2013   Procedure: TRANSURETHRAL RESECTION OF THE PROSTATE (TURP);  Surgeon: MMarissa Nestle MD;  Location: [facility];  Service: Urology;  Laterality: N/A;    Family History Family History  Problem Relation Age of Onset  . Colon cancer Father 685  deceased  . Prostate cancer Father   . Pancreatic cancer Mother 10       deceased  . Prostate cancer Brother   . Arthritis Son   . Arthritis Son   . Diabetes Mellitus II Son      Social History  reports that he quit smoking about 44 years ago. His smoking use included cigars. He started smoking about 58 years ago. He quit after 14.00 years of use. He has never used smokeless tobacco. He reports that he does not drink alcohol or use drugs.  Medications  Current Outpatient Medications:  .  acetaminophen (TYLENOL) 325 MG tablet, Take 2 tablets (650 mg total) by mouth every 6 (six) hours as needed for mild pain (or Fever >/= 101)., Disp: , Rfl:  .  amoxicillin-clavulanate (AUGMENTIN) 875-125 MG tablet, Take 1 tablet by mouth 2 (two) times daily. One po bid x 7 days, Disp: 14 tablet, Rfl: 0 .  bisacodyl (DULCOLAX) 5 MG EC tablet, Take 1 tablet (5 mg total) by mouth daily., Disp: 30 tablet, Rfl: 0 .  brimonidine (ALPHAGAN) 0.2  % ophthalmic solution, Place 1 drop into both eyes 2 (two) times daily., Disp: , Rfl:  .  Cholecalciferol (VITAMIN D3) 1000 units CAPS, Take 1 capsule by mouth. Takes 3 times per week, Disp: , Rfl:  .  clopidogrel (PLAVIX) 75 MG tablet, Take 1 tablet (75 mg total) by mouth daily., Disp: 90 tablet, Rfl: 3 .  diclofenac sodium (VOLTAREN) 1 % GEL, Apply 4 g 4 (four) times daily as needed topically., Disp: 2 Tube, Rfl: 3 .  diphenhydrAMINE (BENADRYL) 50 MG tablet, Take 1 hour prior to procedure., Disp: 1 tablet, Rfl: 0 .  dorzolamide-timolol (COSOPT) 22.3-6.8 MG/ML ophthalmic solution, Place 1 drop into both eyes 2 (two) times daily., Disp: , Rfl:  .  ferrous sulfate 325 (65 FE) MG tablet, Take 325 mg by mouth daily with breakfast., Disp: , Rfl:  .  Ginger, Zingiber officinalis, (GINGER ROOT PO), Take by mouth as needed., Disp: , Rfl:  .  HM ALLERGY RELIEF 25 MG capsule, TAKE TWO CAPSULES BY MOUTH PRIOR TO PROCEDURE, Disp: , Rfl: 0 .  latanoprost (XALATAN) 0.005 % ophthalmic solution, Place 1 drop into both eyes at bedtime., Disp: , Rfl:  .  Menthol, Topical Analgesic, (BIOFREEZE) 4 % GEL, To be used by therapy dept as needed, Disp: , Rfl:  .  metoprolol tartrate (LOPRESSOR) 25 MG tablet, Take 25 mg by mouth 2 (two) times daily., Disp: , Rfl:  .  ondansetron (ZOFRAN) 4 MG tablet, Take 4 mg by mouth as needed for nausea or vomiting., Disp: , Rfl:  .  oxyCODONE-acetaminophen (PERCOCET) 10-325 MG tablet, TAKE 1 TABLET BY MOUTH EVERY 4 TO 6 HOURS, Disp: , Rfl: 0 .  Polyethyl Glycol-Propyl Glycol (LUBRICANT EYE DROPS) 0.4-0.3 % SOLN, Place 1 drop into both eyes daily as needed (for lubricant eye drops). , Disp: , Rfl:  .  potassium chloride (K-DUR,KLOR-CON) 10 MEQ tablet, Take 40 mEq by mouth 2 (two) times daily. , Disp: , Rfl:  .  predniSONE (DELTASONE) 20 MG tablet, Take 2 tablets (40 mg total) by mouth daily., Disp: 10 tablet, Rfl: 0 .  sodium chloride (OCEAN) 0.65 % SOLN nasal spray, Place 1 spray into  both nostrils as needed for congestion., Disp: , Rfl:  .  TURMERIC PO, Take by mouth 2 (two) times daily., Disp: , Rfl:  .  HYDROcodone-acetaminophen (NORCO) 7.5-325 MG tablet, Take 1 tablet by mouth every  6 (six) hours as needed for moderate pain., Disp: 60 tablet, Rfl: 0 .  predniSONE (DELTASONE) 50 MG tablet, Take 1 tablet 13 hours prior to procedure, take 1 tablet 7 hours prior and take 1 tablet 1 hour prior. (Patient not taking: Reported on 03/08/2018), Disp: 3 tablet, Rfl: 0  Allergies Aspirin; Iodinated diagnostic agents; and Nexium [esomeprazole magnesium]  Review of Systems Review of Systems - Oncology ROS negative other than back pain   Physical Exam  Vitals Wt Readings from Last 3 Encounters:  03/08/18 265 lb 4.8 oz (120.3 kg)  02/11/18 268 lb (121.6 kg)  02/09/18 273 lb (123.8 kg)   Temp Readings from Last 3 Encounters:  03/08/18 98 F (36.7 C) (Oral)  02/11/18 97.9 F (36.6 C) (Oral)  02/09/18 (!) 97.5 F (36.4 C) (Oral)   BP Readings from Last 3 Encounters:  03/08/18 (!) 150/69  02/11/18 (!) 156/88  02/09/18 (!) 160/67   Pulse Readings from Last 3 Encounters:  03/08/18 70  02/11/18 72  02/09/18 80   Constitutional: Well-developed, well-nourished, and in no distress.   HENT: Head: Normocephalic and atraumatic.  Mouth/Throat: No oropharyngeal exudate. Mucosa moist. Eyes: Pupils are equal, round, and reactive to light. Conjunctivae are normal. No scleral icterus.  Neck: Normal range of motion. Neck supple. No JVD present.  Cardiovascular: Normal rate, regular rhythm and normal heart sounds.  Exam reveals no gallop and no friction rub.   No murmur heard. Pulmonary/Chest: Effort normal and breath sounds normal. No respiratory distress. No wheezes.No rales.  Abdominal: Soft. Bowel sounds are normal. No distension. There is no tenderness. There is no guarding.  Musculoskeletal: No edema or tenderness.  Lymphadenopathy: No cervical, axillary or supraclavicular  adenopathy.  Neurological: Alert and oriented to person, place, and time. No cranial nerve deficit.  Skin: Skin is warm and dry. No rash noted. No erythema. No pallor.  Psychiatric: Affect and judgment normal.   Labs No visits with results within 3 Day(s) from this visit.  Latest known visit with results is:  Appointment on 03/01/2018  Component Date Value Ref Range Status  . WBC 03/01/2018 9.5  4.0 - 10.5 K/uL Final  . RBC 03/01/2018 5.63  4.22 - 5.81 MIL/uL Final  . Hemoglobin 03/01/2018 14.8  13.0 - 17.0 g/dL Final  . HCT 03/01/2018 46.7  39.0 - 52.0 % Final  . MCV 03/01/2018 82.9  80.0 - 100.0 fL Final  . MCH 03/01/2018 26.3  26.0 - 34.0 pg Final  . MCHC 03/01/2018 31.7  30.0 - 36.0 g/dL Final  . RDW 03/01/2018 17.4* 11.5 - 15.5 % Final  . Platelets 03/01/2018 90* 150 - 400 K/uL Final   Comment: PLATELET COUNT CONFIRMED BY SMEAR SPECIMEN CHECKED FOR CLOTS Immature Platelet Fraction may be clinically indicated, consider ordering this additional test [id number]   . nRBC 03/01/2018 0.0  0.0 - 0.2 % Final  . Neutrophils Relative % 03/01/2018 80  % Final  . Neutro Abs 03/01/2018 7.6  1.7 - 7.7 K/uL Final  . Lymphocytes Relative 03/01/2018 6  % Final  . Lymphs Abs 03/01/2018 0.5* 0.7 - 4.0 K/uL Final  . Monocytes Relative 03/01/2018 9  % Final  . Monocytes Absolute 03/01/2018 0.8  0.1 - 1.0 K/uL Final  . Eosinophils Relative 03/01/2018 1  % Final  . Eosinophils Absolute 03/01/2018 0.1  0.0 - 0.5 K/uL Final  . Basophils Relative 03/01/2018 0  % Final  . Basophils Absolute 03/01/2018 0.0  0.0 - 0.1 K/uL Final  .  Immature Granulocytes 03/01/2018 4  % Final  . Abs Immature Granulocytes 03/01/2018 0.41* 0.00 - 0.07 K/uL Final   Performed at [facility], [address], [place], [place] [address]  . Sodium 03/01/2018 143  135 - 145 mmol/L Final  . Potassium 03/01/2018 3.8  3.5 - 5.1 mmol/L Final  . Chloride 03/01/2018 109  98 - 111 mmol/L Final  . CO2 03/01/2018 27  22 - 32 mmol/L  Final  . Glucose, Bld 03/01/2018 115* 70 - 99 mg/dL Final  . BUN 03/01/2018 13  8 - 23 mg/dL Final  . Creatinine, Ser 03/01/2018 1.06  0.61 - 1.24 mg/dL Final  . Calcium 03/01/2018 9.5  8.9 - 10.3 mg/dL Final  . Total Protein 03/01/2018 6.4* 6.5 - 8.1 g/dL Final  . Albumin 03/01/2018 3.6  3.5 - 5.0 g/dL Final  . AST 03/01/2018 14* 15 - 41 U/L Final  . ALT 03/01/2018 11  0 - 44 U/L Final  . Alkaline Phosphatase 03/01/2018 60  38 - 126 U/L Final  . Total Bilirubin 03/01/2018 1.1  0.3 - 1.2 mg/dL Final  . GFR calc non Af Amer 03/01/2018 >60  >60 mL/min Final  . GFR calc Af Amer 03/01/2018 >60  >60 mL/min Final   Comment: (NOTE) The eGFR has been calculated using the CKD EPI equation. This calculation has not been validated in all clinical situations. eGFR's persistently <60 mL/min signify possible Chronic Kidney Disease.   Georgiann Hahn gap 03/01/2018 7  5 - 15 Final   Performed at [facility], [address], [place], [place] [address]  . LDH 03/01/2018 108  98 - 192 U/L Final   Performed at [facility], [address] [place], [place] [address]  . Total Protein ELP 03/01/2018 5.6* 6.0 - 8.5 g/dL Final  . Albumin ELP 03/01/2018 3.2  2.9 - 4.4 g/dL Final  . Alpha-1-Globulin 03/01/2018 0.2  0.0 - 0.4 g/dL Final  . Alpha-2-Globulin 03/01/2018 0.6  0.4 - 1.0 g/dL Final  . Beta Globulin 03/01/2018 0.8  0.7 - 1.3 g/dL Final  . Gamma Globulin 03/01/2018 0.7  0.4 - 1.8 g/dL Final  . M-Spike, % 03/01/2018 Not Observed  Not Observed g/dL Final  . SPE Interp. 03/01/2018 Comment   Final   Comment: (NOTE) The SPE pattern appears essentially unremarkable. Evidence of monoclonal protein is not apparent. Performed At: [facility] [place], [place] [id number] Rush Farmer MD HB:[id number]   . Comment 03/01/2018 Comment   Final   Comment: (NOTE) Protein electrophoresis scan will follow via computer, mail, or courier delivery.   Marland Kitchen GLOBULIN, TOTAL 03/01/2018 2.4  2.2 - 3.9  g/dL Corrected  . A/G Ratio 03/01/2018 1.3  0.7 - 1.7 Corrected  . CRP 03/01/2018 0.8  <1.0 mg/dL Final   Performed at [facility] [address] [place], [place] [address]  . Rhuematoid fact SerPl-aCnc 03/01/2018 <10.0  0.0 - 13.9 IU/mL Final   Comment: (NOTE) Performed At: [facility] [place], [place] [id number] Rush Farmer MD OE:[id number]   . Sed Rate 03/01/2018 2  0 - 16 mm/hr Final   Performed at [facility], [address] [place], [place] [address]  . Hepatitis B Surface Ag 03/01/2018 Negative  Negative Final  . HCV Ab 03/01/2018 0.1  0.0 - 0.9 s/co ratio Final   Comment: (NOTE)  Negative:     < 0.8                             Indeterminate: 0.8 - 0.9                                  Positive:     > 0.9 The CDC recommends that a positive HCV antibody result be followed up with a HCV Nucleic Acid Amplification test [id number]). Performed At: [facility] [place], [place] [id number] Rush Farmer MD GZ:[id number]   . Hep A IgM 03/01/2018 Negative  Negative Final  . Hep B C IgM 03/01/2018 Negative  Negative Final  . Hep B S Ab 03/01/2018 Non Reactive   Final   Comment: (NOTE)              Non Reactive: Inconsistent with immunity,                            less than 10 mIU/mL              Reactive:     Consistent with immunity,                            greater than 9.9 mIU/mL Performed At: [facility] [place], [place] [id number] Rush Farmer MD UD:[id number]   . Hepatitis B Surface Ag 03/01/2018 Negative  Negative Final   Comment: (NOTE) Performed At: [facility] [place], [place] [id number] Rush Farmer MD PR:[id number]   . Hep B Core Total Ab 03/01/2018 Negative  Negative Final   Comment: (NOTE) Performed At: [facility] [place], [place] [id number] Rush Farmer MD XO:[id number]   . HIV Screen  4th Generation wRfx 03/01/2018 Non Reactive  Non Reactive Final   Comment: (NOTE) Performed At: [facility] [place], [place] [id number] Rush Farmer MD FW:[id number]      Pathology Orders Placed This Encounter  Procedures  . CBC with Differential/Platelet    Standing Status:   Future    Standing Expiration Date:   03/09/2019  . Comprehensive metabolic panel    Standing Status:   Future    Standing Expiration Date:   03/09/2019  . Lactate dehydrogenase    Standing Status:   Future    Standing Expiration Date:   03/09/2019  . Cancer antigen 19-9    Standing Status:   Future    Standing Expiration Date:   03/09/2019  . CEA    Standing Status:   Future    Standing Expiration Date:   03/09/2019       Zoila Shutter MD

## 2018-03-08 NOTE — Telephone Encounter (Signed)
The pt has been scheduled for EUS on 03/22/18 830 am.  Anticoag letter sent to Dr Harl Bowie.  Left message on machine to call back

## 2018-03-08 NOTE — Telephone Encounter (Signed)
Hold plavix 5 days prior to procedure, restart day after procedure   Zandra Abts MD

## 2018-03-08 NOTE — Progress Notes (Signed)
Diagnosis Thrombocytopenia ([place]) - Plan: CBC with Differential/Platelet, Comprehensive metabolic panel, Lactate dehydrogenase, Cancer antigen 19-9, CEA  Pancreatic mass - Plan: CBC with Differential/Platelet, Comprehensive metabolic panel, Lactate dehydrogenase, Cancer antigen 19-9, CEA  Staging Cancer Staging No matching staging information was found for the patient.  Assessment and Plan:  1. Back pain/Pancreatic mass.  Pt has longstanding history of back pain.  SPEP negative, Sed rate 2, RA < 10 all WNL.  He was set up for CT of the abdomen and pelvis that was done on 03/06/2018 that was reviewed and showed  IMPRESSION: 1. 4 cm mass involving the uncinate process of the pancreas, highly suspicious for primary pancreatic malignancy. 2. Mildly increased size of multiple regional lymph nodes suspicious for nodal metastases. 3.  Aortic Atherosclerosis (ICD10-I70.0).  I have spoken with Dr. Ardis Hughs today who will review scan for EUS.  Pt will RTC after diagnostic evaluation to go over pathology and discuss treatment options.  Will check CEA and CA 19-9 on RTC.   Pt given RX for Norco 7.5 mg po q 6 hrs prn pain # 60 with no refills today in clinic.    2.  Leukocytosis, neutrophilia.  Pt was followed by Dr. Talbert Cage.  He had previous evaluation with a negative BCR/ABL  and normal sed rate, C-reactive protein,.  He also had a negative Jak 2 evaluation.    Labs done 03/01/2018 reviewed and showed WBC 9.5 HB 14.8 plts 90,000.  Chemistries WNL with K+ 3.8 Cr 1 and normal LFTS.  Bilirubin is 1.1.  Work-up of elevated WBC is negative from hematological perspective.  Normal variant with only mild elevation noted.    2.  Thrombocytopenia.  Plt count stable at 90.000 on labs done 03/01/2018.  CT chest abdomen and pelvis done 03/06/2018 showed normal spleen and ? Liver hemagioma.   Hepatitis and HIV testing negative.   3.  Colon lesion.  CT OF ABDOMEN AND PELVIS DONE 05/27/2017 showed  IMPRESSION: 1. Focal  inflamed masslike area involving the cecum at the level of the terminal ileum. Findings may represent focal cecal diverticulitis, an infectious or inflammatory process or potentially cecal mass/neoplastic process. Recommend clinical and laboratory correlation. Additionally, patient will need further evaluation with colonoscopy after resolution of the acute symptomatology. 2. Mild wall thickening of the rectum, potentially sequelae of infectious process. 3. Wall thickening of the urinary bladder raising the possibility of cystitis. 4. Re- demonstrated slowly enlarging soft tissue mass within the presacral location. Recommend further evaluation with pelvic MRI. 5. Mild splenomegaly.  Was seen by GI and had EGD done 01/18/2018 that was negative.  Follow-up with GI as recommended.    4. Presacral mass.   MRI of the pelvis done 05/30/2017 shows:   IMPRESSION: 1. 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 Trace edema in the adjacent fascia plane with the perirectal space. There was some faint nodularity in this vicinity in 2015 but no abnormality in the area prior to that. The lesion has slowly enlarged and seems to have a potentially linear fatty element extending sagittally in the right paracentral position. Given the fatty element, possibilities may include myelolipoma, extramedullary hematopoiesis, solitary fibrous tumor, low-grade lymphoma, or soft tissue hemangioma. Given the slow growth and fatty elements this would be unusual for metastatic disease. Appearance not considered characteristic for chordoma and would be unusual for a neurogenic origin tumor such as schwannoma based on the intermediate T2 signal. Consider surveillance or  resection.  Pt has undergone several back surgeries.  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   Follow-up with neurosurgery as recommended.   Greater than 25  minutes spent with more than 50% spent in counseling and coordination of care.    Interval History:  Historical data obtained from note dated 08/09/2017.  77 year old male followed by Dr. Talbert Cage  for leukocytosis and thrombocytopenia.  He has undergone evaluation with normal BCR/ABL,  normal sed rate, C-reactive protein, negative Jak2.  Current Status: Patient is seen today for follow-up.  He is here to go over CT and MRI scans.  Pt continues to complain of  back pain.   Problem List Patient Active Problem List   Diagnosis Date Noted  . Abdominal pain [R10.9] 12/20/2017  . Diverticulitis of colon [K57.32] 06/28/2017  . Gastrointestinal hemorrhage with melena [K92.1] 05/27/2017  . Acute cystitis [N30.00] 05/27/2017  . Pelvic mass in male [R19.00] 05/27/2017  . Acute diverticulitis [K57.92] 05/27/2017  . Heme positive stool [R19.5] 05/27/2017  . Prostatitis [N41.9] 05/27/2017  . S/P colonoscopy [Z98.890] 05/27/2017  . Bilateral primary osteoarthritis of knee [M17.0] 04/06/2017  . Weakness [R53.1] 02/27/2017  . Ventral hernia [K43.9] 02/27/2017  . Acute CVA (cerebrovascular accident) ([place]) [I63.9] 09/13/2016  . Stroke (cerebrum) ([place]) [I63.9] 09/13/2016  . Syncope [R55] 09/12/2016  . Iron deficiency [E61.1] 07/07/2016  . Back pain [M54.9] 03/22/2016  . Acute right-sided low back pain with right-sided sciatica [M54.41]   . S/P nasal septoplasty [Z98.890] 10/07/2015  . Parotitis, acute [K11.21] 03/06/2015  . Nausea without vomiting [R11.0] 12/08/2014  . Dysphagia, pharyngoesophageal phase [R13.14]   . History of colonic polyps [Z86.010]   . Hypokalemia [E87.6] 09/01/2013  . Dehydration [E86.0] 09/01/2013  . Leukocytosis [D72.829] 09/01/2013  . Diastolic dysfunction by echo Dec 2014 [I51.89] 07/30/2013  . Morbid obesity- BMI 44.5 [E66.01] 07/30/2013  . Edema of both legs [R60.0] 07/30/2013  . Bilateral lower leg cellulitis  [L03.116, L03.115] 07/18/2013  . 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 OSA (obstructive sleep apnea) [G47.33] 11/27/2012  . Other spondylosis with radiculopathy, lumbar region [facility] 08/22/2012  . Bright red blood per rectum [K62.5] 06/15/2012  . Constipation [K59.00] 02/29/2012  . SIRS (systemic inflammatory response syndrome) ([facility]) [R65.10] 02/27/2012  . Hyperlipidemia [E78.5] 02/27/2012  . Thrombocytopenia ([place]) [D69.6] 12/30/2011  . Hypothyroidism [E03.9] 10/27/2011  . Upper abdominal pain [R10.10] 10/27/2011  . Rectal bleeding [K62.5] 11/16/2010  . DIARRHEA [R19.7] 06/08/2010  . COLONIC POLYPS, ADENOMATOUS, HX OF [Z86.010] 07/16/2009  . Essential hypertension [I10] 06/19/2009  . GASTROESOPHAGEAL REFLUX DISEASE, CHRONIC [K21.9] 06/19/2009  . FATTY LIVER DISEASE [K76.89] 06/19/2009  . CHOLELITHIASIS, SYMPTOMATIC [K80.20] 06/19/2009  . ABDOMINAL BLOATING [R14.3, R14.1, R14.2] 06/19/2009    Past Medical History Past Medical History:  Diagnosis Date  . Assistance needed for mobility    cane, walker  . Bilateral lower leg cellulitis   . BPH (benign prostatic hyperplasia)   . Cataract    left  . Chronic diastolic HF (heart failure) ([place])   . Chronic low back pain   . Collagen vascular disease ([place])    history of venous insufficency and LE cellulitis  . Complication of anesthesia    2012 due to sleep apnea pt has had difficulty being put under  and waking up from anesthesia  . Deep venous insufficiency   . GERD (gastroesophageal reflux disease)   . Glaucoma  left  . Headache   . Heel spur    right heel  . History of bladder infections   . History of colonic polyps   . Hyperlipidemia   . Hypertension    Sees Dr. Debara Pickett  . Hypothyroidism    hypothyroid denies no rx  . Iron deficiency 07/07/2016  . Kidney stones    passed them  . Obesity   . Osteoarthritis   . PONV (postoperative nausea and vomiting)   . PUD (peptic ulcer disease)   . Ringing in  ears   . S/P colonoscopy 2007, Feb and March 2012   hx of adenomas, left-sided diverticula, On 07/2010 TCS, fresh blood and clot noted coming from TI.  . S/P endoscopy 2007, 2012   2007: nl, May 2012: antral erosions  . SIRS (systemic inflammatory response syndrome) ([place]) 02/27/2012  . Sleep apnea    supposed to see Dr. Luan Pulling to get it started back, hasn't been on in since 2018  . Thrombocytopenia ([place]) 12/30/2011   Stable    Past Surgical History Past Surgical History:  Procedure Laterality Date  . BACK SURGERY    . BACK SURGERY  12/12/2017  . CARDIAC CATHETERIZATION  02/27/2007   no sign of CAD, LVH, mod pulm HTN (Dr. Jackie Plum(  . CATARACT EXTRACTION W/ INTRAOCULAR LENS IMPLANT Right ~ 2012  . COLONOSCOPY N/A 05/28/2014   OVZ:CHYIFOYD colonic polyps/colonic diverticulosis. Tubular adenomas. Next colonoscopy January 2021  . ESOPHAGOGASTRODUODENOSCOPY  Aug 2013   [facility], single 31m pedunculated polyp otherwise normal. HYPERPLASTIC, NEGATIVE h.pylori  . ESOPHAGOGASTRODUODENOSCOPY N/A 05/28/2014   RMR:non-critical Schatzki's ring/HH. Benign gastritis  . ESOPHAGOGASTRODUODENOSCOPY (EGD) WITH PROPOFOL N/A 01/18/2018   Procedure: ESOPHAGOGASTRODUODENOSCOPY (EGD) WITH PROPOFOL;  Surgeon: RDaneil Dolin MD;  [facility];  Service: Endoscopy;  Laterality: N/A;  1:45pm  . INGUINAL HERNIA REPAIR Right 1941  . KNEE ARTHROSCOPY Left ~ 2000  . LAPAROSCOPIC CHOLECYSTECTOMY    . L[place]amba[place]URGERY  2012  . MALONEY DILATION N/A 05/28/2014   Procedure: MVenia MinksDILATION;  Surgeon: RDaneil Dolin MD;  [facility];  Service: Endoscopy;  Laterality: N/A;  . NASAL SEPTOPLASTY W/ TURBINOPLASTY Bilateral 10/07/2015   Procedure: NASAL SEPTOPLASTY WITH TURBINATE REDUCTION;  Surgeon: SLeta Baptist MD;  [place]  Service: ENT;  Laterality: Bilateral;  . NASAL SEPTUM SURGERY Bilateral 10/07/2015    inferior turbinate resection  . NM MYOCAR PERF WALL MOTION  12/2006   dipyridamole myoview -  stress images show mild perfusion defect in mid inferior walls with reversibility at rest; mild perfusion defect in mid inferolateral wall at stress with mild defect reversibility, EF 62%, abnormal but low risk study   . POSTERIOR LUMBAR FUSION  2015  . SAVORY DILATION N/A 05/28/2014   Procedure: SAVORY DILATION;  Surgeon: RDaneil Dolin MD;  Location: [facility];  Service: Endoscopy;  Laterality: N/A;  . SHOULDER OPEN ROTATOR CUFF REPAIR Left early 2000s  . small bowel capsule study  07/2011   ?transient focal ischemia in distal small bowel to explain GI bleeding  . TRANSTHORACIC ECHOCARDIOGRAM  09/2008   EF 60-65%, mod conc LVH  . TRANSURETHRAL RESECTION OF PROSTATE    . TRANSURETHRAL RESECTION OF PROSTATE N/A 06/18/2013   Procedure: TRANSURETHRAL RESECTION OF THE PROSTATE (TURP);  Surgeon: MMarissa Nestle MD;  Location: [facility];  Service: Urology;  Laterality: N/A;    Family History Family History  Problem Relation Age of Onset  . Colon cancer Father 685  deceased  . Prostate cancer Father   . Pancreatic cancer Mother 41       deceased  . Prostate cancer Brother   . Arthritis Son   . Arthritis Son   . Diabetes Mellitus II Son      Social History  reports that he quit smoking about 44 years ago. His smoking use included cigars. He started smoking about 58 years ago. He quit after 14.00 years of use. He has never used smokeless tobacco. He reports that he does not drink alcohol or use drugs.  Medications  Current Outpatient Medications:  .  acetaminophen (TYLENOL) 325 MG tablet, Take 2 tablets (650 mg total) by mouth every 6 (six) hours as needed for mild pain (or Fever >/= 101)., Disp: , Rfl:  .  amoxicillin-clavulanate (AUGMENTIN) 875-125 MG tablet, Take 1 tablet by mouth 2 (two) times daily. One po bid x 7 days, Disp: 14 tablet, Rfl: 0 .  bisacodyl (DULCOLAX) 5 MG EC tablet, Take 1 tablet (5 mg total) by mouth daily., Disp: 30 tablet, Rfl: 0 .  brimonidine (ALPHAGAN) 0.2  % ophthalmic solution, Place 1 drop into both eyes 2 (two) times daily., Disp: , Rfl:  .  Cholecalciferol (VITAMIN D3) 1000 units CAPS, Take 1 capsule by mouth. Takes 3 times per week, Disp: , Rfl:  .  clopidogrel (PLAVIX) 75 MG tablet, Take 1 tablet (75 mg total) by mouth daily., Disp: 90 tablet, Rfl: 3 .  diclofenac sodium (VOLTAREN) 1 % GEL, Apply 4 g 4 (four) times daily as needed topically., Disp: 2 Tube, Rfl: 3 .  diphenhydrAMINE (BENADRYL) 50 MG tablet, Take 1 hour prior to procedure., Disp: 1 tablet, Rfl: 0 .  dorzolamide-timolol (COSOPT) 22.3-6.8 MG/ML ophthalmic solution, Place 1 drop into both eyes 2 (two) times daily., Disp: , Rfl:  .  ferrous sulfate 325 (65 FE) MG tablet, Take 325 mg by mouth daily with breakfast., Disp: , Rfl:  .  Ginger, Zingiber officinalis, (GINGER ROOT PO), Take by mouth as needed., Disp: , Rfl:  .  HM ALLERGY RELIEF 25 MG capsule, TAKE TWO CAPSULES BY MOUTH PRIOR TO PROCEDURE, Disp: , Rfl: 0 .  latanoprost (XALATAN) 0.005 % ophthalmic solution, Place 1 drop into both eyes at bedtime., Disp: , Rfl:  .  Menthol, Topical Analgesic, (BIOFREEZE) 4 % GEL, To be used by therapy dept as needed, Disp: , Rfl:  .  metoprolol tartrate (LOPRESSOR) 25 MG tablet, Take 25 mg by mouth 2 (two) times daily., Disp: , Rfl:  .  ondansetron (ZOFRAN) 4 MG tablet, Take 4 mg by mouth as needed for nausea or vomiting., Disp: , Rfl:  .  oxyCODONE-acetaminophen (PERCOCET) 10-325 MG tablet, TAKE 1 TABLET BY MOUTH EVERY 4 TO 6 HOURS, Disp: , Rfl: 0 .  Polyethyl Glycol-Propyl Glycol (LUBRICANT EYE DROPS) 0.4-0.3 % SOLN, Place 1 drop into both eyes daily as needed (for lubricant eye drops). , Disp: , Rfl:  .  potassium chloride (K-DUR,KLOR-CON) 10 MEQ tablet, Take 40 mEq by mouth 2 (two) times daily. , Disp: , Rfl:  .  predniSONE (DELTASONE) 20 MG tablet, Take 2 tablets (40 mg total) by mouth daily., Disp: 10 tablet, Rfl: 0 .  sodium chloride (OCEAN) 0.65 % SOLN nasal spray, Place 1 spray into  both nostrils as needed for congestion., Disp: , Rfl:  .  TURMERIC PO, Take by mouth 2 (two) times daily., Disp: , Rfl:  .  HYDROcodone-acetaminophen (NORCO) 7.5-325 MG tablet, Take 1 tablet by mouth every  6 (six) hours as needed for moderate pain., Disp: 60 tablet, Rfl: 0 .  predniSONE (DELTASONE) 50 MG tablet, Take 1 tablet 13 hours prior to procedure, take 1 tablet 7 hours prior and take 1 tablet 1 hour prior. (Patient not taking: Reported on 03/08/2018), Disp: 3 tablet, Rfl: 0  Allergies Aspirin; Iodinated diagnostic agents; and Nexium [esomeprazole magnesium]  Review of Systems Review of Systems - Oncology ROS negative other than back pain   Physical Exam  Vitals Wt Readings from Last 3 Encounters:  03/08/18 265 lb 4.8 oz (120.3 kg)  02/11/18 268 lb (121.6 kg)  02/09/18 273 lb (123.8 kg)   Temp Readings from Last 3 Encounters:  03/08/18 98 F (36.7 C) (Oral)  02/11/18 97.9 F (36.6 C) (Oral)  02/09/18 (!) 97.5 F (36.4 C) (Oral)   BP Readings from Last 3 Encounters:  03/08/18 (!) 150/69  02/11/18 (!) 156/88  02/09/18 (!) 160/67   Pulse Readings from Last 3 Encounters:  03/08/18 70  02/11/18 72  02/09/18 80   Constitutional: Well-developed, well-nourished, and in no distress.   HENT: Head: Normocephalic and atraumatic.  Mouth/Throat: No oropharyngeal exudate. Mucosa moist. Eyes: Pupils are equal, round, and reactive to light. Conjunctivae are normal. No scleral icterus.  Neck: Normal range of motion. Neck supple. No JVD present.  Cardiovascular: Normal rate, regular rhythm and normal heart sounds.  Exam reveals no gallop and no friction rub.   No murmur heard. Pulmonary/Chest: Effort normal and breath sounds normal. No respiratory distress. No wheezes.No rales.  Abdominal: Soft. Bowel sounds are normal. No distension. There is no tenderness. There is no guarding.  Musculoskeletal: No edema or tenderness.  Lymphadenopathy: No cervical, axillary or supraclavicular  adenopathy.  Neurological: Alert and oriented to person, place, and time. No cranial nerve deficit.  Skin: Skin is warm and dry. No rash noted. No erythema. No pallor.  Psychiatric: Affect and judgment normal.   Labs No visits with results within 3 Day(s) from this visit.  Latest known visit with results is:  Appointment on 03/01/2018  Component Date Value Ref Range Status  . WBC 03/01/2018 9.5  4.0 - 10.5 K/uL Final  . RBC 03/01/2018 5.63  4.22 - 5.81 MIL/uL Final  . Hemoglobin 03/01/2018 14.8  13.0 - 17.0 g/dL Final  . HCT 03/01/2018 46.7  39.0 - 52.0 % Final  . MCV 03/01/2018 82.9  80.0 - 100.0 fL Final  . MCH 03/01/2018 26.3  26.0 - 34.0 pg Final  . MCHC 03/01/2018 31.7  30.0 - 36.0 g/dL Final  . RDW 03/01/2018 17.4* 11.5 - 15.5 % Final  . Platelets 03/01/2018 90* 150 - 400 K/uL Final   Comment: PLATELET COUNT CONFIRMED BY SMEAR SPECIMEN CHECKED FOR CLOTS Immature Platelet Fraction may be clinically indicated, consider ordering this additional test [id number]   . nRBC 03/01/2018 0.0  0.0 - 0.2 % Final  . Neutrophils Relative % 03/01/2018 80  % Final  . Neutro Abs 03/01/2018 7.6  1.7 - 7.7 K/uL Final  . Lymphocytes Relative 03/01/2018 6  % Final  . Lymphs Abs 03/01/2018 0.5* 0.7 - 4.0 K/uL Final  . Monocytes Relative 03/01/2018 9  % Final  . Monocytes Absolute 03/01/2018 0.8  0.1 - 1.0 K/uL Final  . Eosinophils Relative 03/01/2018 1  % Final  . Eosinophils Absolute 03/01/2018 0.1  0.0 - 0.5 K/uL Final  . Basophils Relative 03/01/2018 0  % Final  . Basophils Absolute 03/01/2018 0.0  0.0 - 0.1 K/uL Final  .  Immature Granulocytes 03/01/2018 4  % Final  . Abs Immature Granulocytes 03/01/2018 0.41* 0.00 - 0.07 K/uL Final   Performed at [facility], [address] [place], [place] [address]  . Sodium 03/01/2018 143  135 - 145 mmol/L Final  . Potassium 03/01/2018 3.8  3.5 - 5.1 mmol/L Final  . Chloride 03/01/2018 109  98 - 111 mmol/L Final  . CO2 03/01/2018 27  22 - 32 mmol/L  Final  . Glucose, Bld 03/01/2018 115* 70 - 99 mg/dL Final  . BUN 03/01/2018 13  8 - 23 mg/dL Final  . Creatinine, Ser 03/01/2018 1.06  0.61 - 1.24 mg/dL Final  . Calcium 03/01/2018 9.5  8.9 - 10.3 mg/dL Final  . Total Protein 03/01/2018 6.4* 6.5 - 8.1 g/dL Final  . Albumin 03/01/2018 3.6  3.5 - 5.0 g/dL Final  . AST 03/01/2018 14* 15 - 41 U/L Final  . ALT 03/01/2018 11  0 - 44 U/L Final  . Alkaline Phosphatase 03/01/2018 60  38 - 126 U/L Final  . Total Bilirubin 03/01/2018 1.1  0.3 - 1.2 mg/dL Final  . GFR calc non Af Amer 03/01/2018 >60  >60 mL/min Final  . GFR calc Af Amer 03/01/2018 >60  >60 mL/min Final   Comment: (NOTE) The eGFR has been calculated using the CKD EPI equation. This calculation has not been validated in all clinical situations. eGFR's persistently <60 mL/min signify possible Chronic Kidney Disease.   Georgiann Hahn gap 03/01/2018 7  5 - 15 Final   Performed at [facility], [address], [place], [place] [address]  . LDH 03/01/2018 108  98 - 192 U/L Final   Performed at [facility], [address], [place], [place] [address]  . Total Protein ELP 03/01/2018 5.6* 6.0 - 8.5 g/dL Final  . Albumin ELP 03/01/2018 3.2  2.9 - 4.4 g/dL Final  . Alpha-1-Globulin 03/01/2018 0.2  0.0 - 0.4 g/dL Final  . Alpha-2-Globulin 03/01/2018 0.6  0.4 - 1.0 g/dL Final  . Beta Globulin 03/01/2018 0.8  0.7 - 1.3 g/dL Final  . Gamma Globulin 03/01/2018 0.7  0.4 - 1.8 g/dL Final  . M-Spike, % 03/01/2018 Not Observed  Not Observed g/dL Final  . SPE Interp. 03/01/2018 Comment   Final   Comment: (NOTE) The SPE pattern appears essentially unremarkable. Evidence of monoclonal protein is not apparent. Performed At: [facility] [place], [place] [id number] Rush Farmer MD [id number]   . Comment 03/01/2018 Comment   Final   Comment: (NOTE) Protein electrophoresis scan will follow via computer, mail, or courier delivery.   Marland Kitchen GLOBULIN, TOTAL 03/01/2018 2.4  2.2 - 3.9  g/dL Corrected  . A/G Ratio 03/01/2018 1.3  0.7 - 1.7 Corrected  . CRP 03/01/2018 0.8  <1.0 mg/dL Final   Performed at [facility] [address], [place], [place] [address]  . Rhuematoid fact SerPl-aCnc 03/01/2018 <10.0  0.0 - 13.9 IU/mL Final   Comment: (NOTE) Performed At: [facility] [place], [place] [id number] Rush Farmer MD [id number]   . Sed Rate 03/01/2018 2  0 - 16 mm/hr Final   Performed at [facility], [address], [place],  [address]  . Hepatitis B Surface Ag 03/01/2018 Negative  Negative Final  . HCV Ab 03/01/2018 0.1  0.0 - 0.9 s/co ratio Final   Comment: (NOTE)  Negative:     < 0.8                             Indeterminate: 0.8 - 0.9                                  Positive:     > 0.9 The CDC recommends that a positive HCV antibody result be followed up with a HCV Nucleic Acid Amplification test ([id number]). Performed At: [facility] [place], [place] [id number] Rush Farmer MD [id number]   . Hep A IgM 03/01/2018 Negative  Negative Final  . Hep B C IgM 03/01/2018 Negative  Negative Final  . Hep B S Ab 03/01/2018 Non Reactive   Final   Comment: (NOTE)              Non Reactive: Inconsistent with immunity,                            less than 10 mIU/mL              Reactive:     Consistent with immunity,                            greater than 9.9 mIU/mL Performed At: [facility] [place], [place] [id number] Rush Farmer MD [id number]   . Hepatitis B Surface Ag 03/01/2018 Negative  Negative Final   Comment: (NOTE) Performed At: [facility] [place], [place] [id number] Rush Farmer MD [id number]   . Hep B Core Total Ab 03/01/2018 Negative  Negative Final   Comment: (NOTE) Performed At: [facility] [place], [place] [id number] Rush Farmer MD [id number]   . HIV Screen  4th Generation wRfx 03/01/2018 Non Reactive  Non Reactive Final   Comment: (NOTE) Performed At: [facility] [place], [place] [id number] Rush Farmer MD [id number]      Pathology Orders Placed This Encounter  Procedures  . CBC with Differential/Platelet    Standing Status:   Future    Standing Expiration Date:   03/09/2019  . Comprehensive metabolic panel    Standing Status:   Future    Standing Expiration Date:   03/09/2019  . Lactate dehydrogenase    Standing Status:   Future    Standing Expiration Date:   03/09/2019  . Cancer antigen 19-9    Standing Status:   Future    Standing Expiration Date:   03/09/2019  . CEA    Standing Status:   Future    Standing Expiration Date:   03/09/2019       Zoila Shutter MD

## 2018-03-08 NOTE — Telephone Encounter (Signed)
EUS scheduled, pt instructed and medications reviewed.  Patient instructions mailed to home.  Patient to call with any questions or concerns.  

## 2018-03-08 NOTE — Telephone Encounter (Signed)
The pt has been notified that he can hold plavix 5 days prior to the procedure.  The pt has been advised of the information and verbalized understanding.

## 2018-03-13 DIAGNOSIS — I739 Peripheral vascular disease, unspecified: Secondary | ICD-10-CM | POA: Diagnosis not present

## 2018-03-19 ENCOUNTER — Encounter (HOSPITAL_COMMUNITY): Payer: Self-pay | Admitting: *Deleted

## 2018-03-19 ENCOUNTER — Other Ambulatory Visit: Payer: Self-pay

## 2018-03-19 DIAGNOSIS — H548 Legal blindness, as defined in USA: Secondary | ICD-10-CM | POA: Diagnosis not present

## 2018-03-19 DIAGNOSIS — H401133 Primary open-angle glaucoma, bilateral, severe stage: Secondary | ICD-10-CM | POA: Diagnosis not present

## 2018-03-19 DIAGNOSIS — Z961 Presence of intraocular lens: Secondary | ICD-10-CM | POA: Diagnosis not present

## 2018-03-21 ENCOUNTER — Other Ambulatory Visit: Payer: Self-pay | Admitting: Cardiology

## 2018-03-21 ENCOUNTER — Inpatient Hospital Stay (HOSPITAL_COMMUNITY): Payer: Medicare HMO

## 2018-03-21 DIAGNOSIS — K8689 Other specified diseases of pancreas: Secondary | ICD-10-CM | POA: Diagnosis not present

## 2018-03-21 DIAGNOSIS — D72829 Elevated white blood cell count, unspecified: Secondary | ICD-10-CM | POA: Diagnosis not present

## 2018-03-21 DIAGNOSIS — Z800 Family history of malignant neoplasm of digestive organs: Secondary | ICD-10-CM | POA: Diagnosis not present

## 2018-03-21 DIAGNOSIS — Z87891 Personal history of nicotine dependence: Secondary | ICD-10-CM | POA: Diagnosis not present

## 2018-03-21 DIAGNOSIS — G8929 Other chronic pain: Secondary | ICD-10-CM | POA: Diagnosis not present

## 2018-03-21 DIAGNOSIS — Z79899 Other long term (current) drug therapy: Secondary | ICD-10-CM | POA: Diagnosis not present

## 2018-03-21 DIAGNOSIS — D696 Thrombocytopenia, unspecified: Secondary | ICD-10-CM | POA: Diagnosis not present

## 2018-03-21 DIAGNOSIS — Z8042 Family history of malignant neoplasm of prostate: Secondary | ICD-10-CM | POA: Diagnosis not present

## 2018-03-21 DIAGNOSIS — Z833 Family history of diabetes mellitus: Secondary | ICD-10-CM | POA: Diagnosis not present

## 2018-03-21 LAB — CBC WITH DIFFERENTIAL/PLATELET
ABS IMMATURE GRANULOCYTES: 0.46 10*3/uL — AB (ref 0.00–0.07)
Basophils Absolute: 0.1 10*3/uL (ref 0.0–0.1)
Basophils Relative: 1 %
EOS PCT: 1 %
Eosinophils Absolute: 0.1 10*3/uL (ref 0.0–0.5)
HEMATOCRIT: 46.6 % (ref 39.0–52.0)
HEMOGLOBIN: 14.8 g/dL (ref 13.0–17.0)
Immature Granulocytes: 5 %
LYMPHS ABS: 0.7 10*3/uL (ref 0.7–4.0)
LYMPHS PCT: 7 %
MCH: 26.2 pg (ref 26.0–34.0)
MCHC: 31.8 g/dL (ref 30.0–36.0)
MCV: 82.6 fL (ref 80.0–100.0)
MONO ABS: 0.9 10*3/uL (ref 0.1–1.0)
MONOS PCT: 10 %
NEUTROS ABS: 7.6 10*3/uL (ref 1.7–7.7)
Neutrophils Relative %: 76 %
Platelets: 99 10*3/uL — ABNORMAL LOW (ref 150–400)
RBC: 5.64 MIL/uL (ref 4.22–5.81)
RDW: 17.5 % — ABNORMAL HIGH (ref 11.5–15.5)
WBC: 9.8 10*3/uL (ref 4.0–10.5)
nRBC: 0 % (ref 0.0–0.2)

## 2018-03-21 LAB — COMPREHENSIVE METABOLIC PANEL
ALT: 10 U/L (ref 0–44)
AST: 14 U/L — ABNORMAL LOW (ref 15–41)
Albumin: 3.7 g/dL (ref 3.5–5.0)
Alkaline Phosphatase: 65 U/L (ref 38–126)
Anion gap: 8 (ref 5–15)
BUN: 15 mg/dL (ref 8–23)
CHLORIDE: 105 mmol/L (ref 98–111)
CO2: 26 mmol/L (ref 22–32)
CREATININE: 1.08 mg/dL (ref 0.61–1.24)
Calcium: 9.4 mg/dL (ref 8.9–10.3)
GFR calc Af Amer: >60 mL/min (ref >60)
Glucose, Bld: 106 mg/dL — ABNORMAL HIGH (ref 70–99)
Potassium: 4.4 mmol/L (ref 3.5–5.1)
Sodium: 139 mmol/L (ref 135–145)
Total Bilirubin: 1.6 mg/dL — ABNORMAL HIGH (ref 0.3–1.2)
Total Protein: 6.3 g/dL — ABNORMAL LOW (ref 6.5–8.1)

## 2018-03-21 LAB — LACTATE DEHYDROGENASE: LDH: 131 U/L (ref 98–192)

## 2018-03-21 MED ORDER — CLOPIDOGREL BISULFATE 75 MG PO TABS: 75.0000 mg | ORAL_TABLET | Freq: Every day | ORAL | 0 refills | Status: AC

## 2018-03-21 NOTE — Telephone Encounter (Signed)
Done

## 2018-03-21 NOTE — Telephone Encounter (Signed)
°  1. Which medications need to be refilled? (please list name of each medication and dose if known)  clopidogrel (PLAVIX) 75 MG tablet [[id number]]    2. Which pharmacy/location (including street and city if local pharmacy) is medication to be sent to? [facility]  3. Do they need a 30 day or 90 day supply?  90 day  Pt is going to run out before he is seen by Dr. Harl Bowie, he's scheduled for 04/27/18

## 2018-03-22 ENCOUNTER — Ambulatory Visit (HOSPITAL_COMMUNITY): Payer: Medicare HMO | Admitting: Anesthesiology

## 2018-03-22 ENCOUNTER — Other Ambulatory Visit: Payer: Self-pay

## 2018-03-22 ENCOUNTER — Encounter (HOSPITAL_COMMUNITY)
Admission: RE | Disposition: A | Discharge status: Home / Self Care | Payer: Self-pay | Source: Ambulatory Visit | Attending: Gastroenterology

## 2018-03-22 ENCOUNTER — Encounter (HOSPITAL_COMMUNITY): Payer: Self-pay | Admitting: *Deleted

## 2018-03-22 ENCOUNTER — Ambulatory Visit (HOSPITAL_COMMUNITY)
Admission: RE | Admit: 2018-03-22 | Discharge: 2018-03-22 | Disposition: A | Discharge status: Home / Self Care | Payer: Medicare HMO | Source: Ambulatory Visit | Attending: Gastroenterology | Admitting: Gastroenterology

## 2018-03-22 DIAGNOSIS — C259 Malignant neoplasm of pancreas, unspecified: Secondary | ICD-10-CM

## 2018-03-22 DIAGNOSIS — I5032 Chronic diastolic (congestive) heart failure: Secondary | ICD-10-CM | POA: Insufficient documentation

## 2018-03-22 DIAGNOSIS — Z800 Family history of malignant neoplasm of digestive organs: Secondary | ICD-10-CM | POA: Diagnosis not present

## 2018-03-22 DIAGNOSIS — I110 Hypertensive heart disease with heart failure: Secondary | ICD-10-CM | POA: Insufficient documentation

## 2018-03-22 DIAGNOSIS — K639 Disease of intestine, unspecified: Secondary | ICD-10-CM | POA: Insufficient documentation

## 2018-03-22 DIAGNOSIS — K5900 Constipation, unspecified: Secondary | ICD-10-CM | POA: Insufficient documentation

## 2018-03-22 DIAGNOSIS — Z87891 Personal history of nicotine dependence: Secondary | ICD-10-CM | POA: Insufficient documentation

## 2018-03-22 DIAGNOSIS — K8689 Other specified diseases of pancreas: Secondary | ICD-10-CM | POA: Diagnosis not present

## 2018-03-22 DIAGNOSIS — M545 Low back pain: Secondary | ICD-10-CM | POA: Diagnosis not present

## 2018-03-22 DIAGNOSIS — Z7951 Long term (current) use of inhaled steroids: Secondary | ICD-10-CM | POA: Diagnosis not present

## 2018-03-22 DIAGNOSIS — C250 Malignant neoplasm of head of pancreas: Secondary | ICD-10-CM | POA: Insufficient documentation

## 2018-03-22 DIAGNOSIS — K219 Gastro-esophageal reflux disease without esophagitis: Secondary | ICD-10-CM | POA: Insufficient documentation

## 2018-03-22 DIAGNOSIS — Z79899 Other long term (current) drug therapy: Secondary | ICD-10-CM | POA: Diagnosis not present

## 2018-03-22 DIAGNOSIS — D72828 Other elevated white blood cell count: Secondary | ICD-10-CM | POA: Insufficient documentation

## 2018-03-22 DIAGNOSIS — Z6841 Body Mass Index (BMI) 40.0 and over, adult: Secondary | ICD-10-CM | POA: Insufficient documentation

## 2018-03-22 DIAGNOSIS — Z7902 Long term (current) use of antithrombotics/antiplatelets: Secondary | ICD-10-CM | POA: Diagnosis not present

## 2018-03-22 DIAGNOSIS — G8929 Other chronic pain: Secondary | ICD-10-CM | POA: Insufficient documentation

## 2018-03-22 DIAGNOSIS — D696 Thrombocytopenia, unspecified: Secondary | ICD-10-CM | POA: Insufficient documentation

## 2018-03-22 DIAGNOSIS — Z8711 Personal history of peptic ulcer disease: Secondary | ICD-10-CM | POA: Diagnosis not present

## 2018-03-22 DIAGNOSIS — D72829 Elevated white blood cell count, unspecified: Secondary | ICD-10-CM | POA: Insufficient documentation

## 2018-03-22 DIAGNOSIS — R933 Abnormal findings on diagnostic imaging of other parts of digestive tract: Secondary | ICD-10-CM | POA: Diagnosis not present

## 2018-03-22 DIAGNOSIS — I700 Atherosclerosis of aorta: Secondary | ICD-10-CM | POA: Insufficient documentation

## 2018-03-22 DIAGNOSIS — Z8673 Personal history of transient ischemic attack (TIA), and cerebral infarction without residual deficits: Secondary | ICD-10-CM | POA: Insufficient documentation

## 2018-03-22 DIAGNOSIS — G4733 Obstructive sleep apnea (adult) (pediatric): Secondary | ICD-10-CM | POA: Diagnosis not present

## 2018-03-22 DIAGNOSIS — Z7952 Long term (current) use of systemic steroids: Secondary | ICD-10-CM | POA: Diagnosis not present

## 2018-03-22 DIAGNOSIS — I2510 Atherosclerotic heart disease of native coronary artery without angina pectoris: Secondary | ICD-10-CM | POA: Diagnosis not present

## 2018-03-22 DIAGNOSIS — H409 Unspecified glaucoma: Secondary | ICD-10-CM | POA: Diagnosis not present

## 2018-03-22 HISTORY — PX: EUS: SHX5427

## 2018-03-22 HISTORY — PX: FINE NEEDLE ASPIRATION: SHX5430

## 2018-03-22 HISTORY — PX: ESOPHAGOGASTRODUODENOSCOPY: SHX5428

## 2018-03-22 LAB — CANCER ANTIGEN 19-9: CAN 19-9: 2011 U/mL — AB (ref 0–35)

## 2018-03-22 LAB — CEA: CEA1: 3.3 ng/mL (ref 0.0–4.7)

## 2018-03-22 SURGERY — UPPER ENDOSCOPIC ULTRASOUND (EUS) RADIAL
Anesthesia: Monitor Anesthesia Care

## 2018-03-22 MED ORDER — PROPOFOL 10 MG/ML IV BOLUS
INTRAVENOUS | Status: AC
  Filled 2018-03-22: qty 40

## 2018-03-22 MED ORDER — LIDOCAINE 2% (20 MG/ML) 5 ML SYRINGE
INTRAMUSCULAR | Status: DC | PRN
  Administered 2018-03-22: 50 mg via INTRAVENOUS

## 2018-03-22 MED ORDER — PROPOFOL 500 MG/50ML IV EMUL
INTRAVENOUS | Status: DC | PRN
  Administered 2018-03-22: 100 ug/kg/min via INTRAVENOUS

## 2018-03-22 MED ORDER — EPHEDRINE SULFATE-NACL 50-0.9 MG/10ML-% IV SOSY
PREFILLED_SYRINGE | INTRAVENOUS | Status: DC | PRN
  Administered 2018-03-22: 10 mg via INTRAVENOUS

## 2018-03-22 MED ORDER — PHENYLEPHRINE 40 MCG/ML (10ML) SYRINGE FOR IV PUSH (FOR BLOOD PRESSURE SUPPORT)
PREFILLED_SYRINGE | INTRAVENOUS | Status: DC | PRN
  Administered 2018-03-22 (×3): 80 ug via INTRAVENOUS

## 2018-03-22 MED ORDER — LACTATED RINGERS IV SOLN
INTRAVENOUS | Status: DC
  Administered 2018-03-22: 08:00:00 via INTRAVENOUS

## 2018-03-22 MED ORDER — SODIUM CHLORIDE 0.9 % IV SOLN: INTRAVENOUS | Status: DC

## 2018-03-22 MED ORDER — ONDANSETRON HCL 4 MG/2ML IJ SOLN
INTRAMUSCULAR | Status: DC | PRN
  Administered 2018-03-22: 4 mg via INTRAVENOUS

## 2018-03-22 MED ORDER — PROPOFOL 10 MG/ML IV BOLUS
INTRAVENOUS | Status: DC | PRN
  Administered 2018-03-22: 20 mg via INTRAVENOUS
  Administered 2018-03-22: 40 mg via INTRAVENOUS

## 2018-03-22 MED ORDER — PROPOFOL 10 MG/ML IV BOLUS
INTRAVENOUS | Status: AC
  Filled 2018-03-22: qty 20

## 2018-03-22 NOTE — Anesthesia Preprocedure Evaluation (Signed)
Anesthesia Evaluation  Patient identified by MRN, date of birth, ID band Patient awake    Reviewed: Allergy & Precautions, NPO status , Patient's Chart, lab work & pertinent test results  History of Anesthesia Complications (+) Emergence Delirium  Airway Mallampati: III  TM Distance: <3 FB Neck ROM: Full    Dental no notable dental hx.    Pulmonary sleep apnea , former smoker,    Pulmonary exam normal breath sounds clear to auscultation       Cardiovascular hypertension, Normal cardiovascular exam Rhythm:Regular Rate:Normal     Neuro/Psych negative neurological ROS  negative psych ROS   GI/Hepatic Neg liver ROS, GERD  ,  Endo/Other  Hypothyroidism Morbid obesity  Renal/GU negative Renal ROS  negative genitourinary   Musculoskeletal negative musculoskeletal ROS (+)   Abdominal (+) + obese,   Peds negative pediatric ROS (+)  Hematology negative hematology ROS (+)   Anesthesia Other Findings   Reproductive/Obstetrics negative OB ROS                             Anesthesia Physical Anesthesia Plan  ASA: III  Anesthesia Plan: MAC   Post-op Pain Management:    Induction: Intravenous  PONV Risk Score and Plan: 0  Airway Management Planned: Simple Face Mask  Additional Equipment:   Intra-op Plan:   Post-operative Plan:   Informed Consent: I have reviewed the patients History and Physical, chart, labs and discussed the procedure including the risks, benefits and alternatives for the proposed anesthesia with the patient or authorized representative who has indicated his/her understanding and acceptance.   Dental advisory given  Plan Discussed with: CRNA and Surgeon  Anesthesia Plan Comments:         Anesthesia Quick Evaluation

## 2018-03-22 NOTE — Anesthesia Postprocedure Evaluation (Signed)
Anesthesia Post Note  Patient: [name] [name].  Procedure(s) Performed: UPPER ENDOSCOPIC ULTRASOUND (EUS) RADIAL (N/A ) FINE NEEDLE ASPIRATION (FNA) LINEAR (N/A )     Patient location during evaluation: PACU Anesthesia Type: MAC Level of consciousness: awake and alert Pain management: pain level controlled Vital Signs Assessment: post-procedure vital signs reviewed and stable Respiratory status: spontaneous breathing, nonlabored ventilation, respiratory function stable and patient connected to nasal cannula oxygen Cardiovascular status: stable and blood pressure returned to baseline Postop Assessment: no apparent nausea or vomiting Anesthetic complications: no    Last Vitals:  Vitals:   03/22/18 0920 03/22/18 0930  BP: 123/71 (!) 145/68  Pulse: 70 72  Resp: 18 (!) 21  Temp:    SpO2: 98% 97%    Last Pain:  Vitals:   03/22/18 0930  TempSrc:   PainSc: 0-No pain                 Lavaughn Bisig S

## 2018-03-22 NOTE — Discharge Instructions (Signed)
YOU HAD AN ENDOSCOPIC PROCEDURE TODAY: Refer to the procedure report and other information in the discharge instructions given to you for any specific questions about what was found during the examination. If this information does not answer your questions, please call [place] office at [phone] to clarify.  ° °YOU SHOULD EXPECT: Some feelings of bloating in the abdomen. Passage of more gas than usual. Walking can help get rid of the air that was put into your GI tract during the procedure and reduce the bloating. If you had a lower endoscopy (such as a colonoscopy or flexible sigmoidoscopy) you may notice spotting of blood in your stool or on the toilet paper. Some abdominal soreness may be present for a day or two, also. ° °DIET: Your first meal following the procedure should be a light meal and then it is ok to progress to your normal diet. A half-sandwich or bowl of soup is an example of a good first meal. Heavy or fried foods are harder to digest and may make you feel nauseous or bloated. Drink plenty of fluids but you should avoid alcoholic beverages for 24 hours. If you had a esophageal dilation, please see attached instructions for diet.   ° °ACTIVITY: Your care partner should take you home directly after the procedure. You should plan to take it easy, moving slowly for the rest of the day. You can resume normal activity the day after the procedure however YOU SHOULD NOT DRIVE, use power tools, machinery or perform tasks that involve climbing or major physical exertion for 24 hours (because of the sedation medicines used during the test).  ° °SYMPTOMS TO REPORT IMMEDIATELY: °A gastroenterologist can be reached at any hour. Please call [phone]  for any of the following symptoms:  °Following lower endoscopy (colonoscopy, flexible sigmoidoscopy) °Excessive amounts of blood in the stool  °Significant tenderness, worsening of abdominal pains  °Swelling of the abdomen that is new, acute  °Fever of 100° or  higher  °Following upper endoscopy (EGD, EUS, ERCP, esophageal dilation) °Vomiting of blood or coffee ground material  °New, significant abdominal pain  °New, significant chest pain or pain under the shoulder blades  °Painful or persistently difficult swallowing  °New shortness of breath  °Black, tarry-looking or red, bloody stools ° °FOLLOW UP:  °If any biopsies were taken you will be contacted by phone or by letter within the next 1-3 weeks. Call [phone]  if you have not heard about the biopsies in 3 weeks.  °Please also call with any specific questions about appointments or follow up tests. ° °

## 2018-03-22 NOTE — Op Note (Signed)
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 11/2017 CT scan), Mother had                            pancreatic cancer. No weight loss. Never etoh                            drinker Providers:                Milus Banister, MD, Cleda Daub, RN, William Dalton, Technician Referring MD:             Zoila Shutter, MD [facility] Medicines:                Monitored Anesthesia Care Complications:            No immediate complications. Estimated blood loss:                            None. Estimated Blood Loss:     Estimated blood loss: none. Procedure:                Pre-Anesthesia Assessment:                           - Prior to the procedure, a History and Physical                            was performed, and patient medications and                            allergies were reviewed. The patient's tolerance of                            previous anesthesia was also reviewed. The risks                            and benefits of the procedure and the sedation                            options and risks were discussed with the patient.                            All questions were answered, and informed consent  was obtained. Prior Anticoagulants: The patient has                            taken Plavix (clopidogrel), last dose was 5 days                            prior to procedure. ASA Grade Assessment: III - A                            patient with severe systemic disease. After                            reviewing the risks and benefits, the patient was                            deemed in satisfactory  condition to undergo the                            procedure.                           After obtaining informed consent, the endoscope was                            passed under direct vision. Throughout the                            procedure, the patient's blood pressure, pulse, and                            oxygen saturations were monitored continuously. The                            [id number] ([id number]) Olympus Radial EUS was                            introduced through the mouth, and advanced to the                            second part of duodenum. The upper EUS was                            accomplished without difficulty. The patient                            tolerated the procedure well. Findings:      ENDOSCOPIC FINDING (limited views with radial and linear echoendoscopes)       The examined esophagus was endoscopically normal.      The entire examined stomach was endoscopically normal.      The examined duodenum was endoscopically normal.      ENDOSONOGRAPHIC FINDING: :      1. A solid mass was identified in the uncinate process of the pancreas.       The mass was hypoechoic and heterogenous. The mass measured 25  mm in       maximal cross-sectional diameter. The endosonographic borders were       poorly-defined. Fine needle aspiration for cytology was performed. Color       Doppler imaging was utilized prior to needle puncture to confirm a lack       of significant vascular structures within the needle path. Two passes       were made with the 25 gauge needle using a transduodenal approach. A       cytotechnologist was present to evaluate the adequacy of the specimen.      2. Small peripancreatic lymphnode (46mm across, not sampled)      3. CBD is normal, non-dilated.      4. Main pancreatic duct is normal, non-dilated.      5. Limited views of liver, spleen were normal. 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 My                            review of recent CT shows that the mass surrounds                            the SMA and also involves the SMV. I could not                            confirm that vascular involvement on todays                            procedure however. 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 Await final cytology report. If                            cancer is confirmed it is probably not resectable                            given SMA and SMV invasion. Moderate Sedation:      Not Applicable - Patient had care per Anesthesia. Recommendation:           - Discharge patient to home (ambulatory).                           - Await cytology results. Procedure Code(s):        --- Professional ---                           [phone], Esophagogastroduodenoscopy, flexible,  transoral; with transendoscopic ultrasound-guided                            intramural or transmural fine needle                            aspiration/biopsy(s), (includes endoscopic                            ultrasound examination limited to the esophagus,                            stomach or duodenum, and adjacent structures) Diagnosis Code(s):        --- Professional ---                           K86.89, Other specified diseases of pancreas                           R93.3, Abnormal findings on diagnostic imaging of                            other parts of digestive tract CPT copyright 2018 [employer]. All rights reserved. The codes documented in this report are preliminary and upon coder review may  be revised to meet current compliance requirements. Milus Banister, MD 03/22/2018 9:06:58 AM This report has been signed electronically. Number of Addenda: 0

## 2018-03-22 NOTE — Interval H&P Note (Signed)
History and Physical Interval Note:  03/22/2018 7:30 AM  [name] [name] [name].  has presented today for surgery, with the diagnosis of pancreatic uncinate mass  The various methods of treatment have been discussed with the patient and family. After consideration of risks, benefits and other options for treatment, the patient has consented to  Procedure(s): UPPER ENDOSCOPIC ULTRASOUND (EUS) RADIAL (N/A) as a surgical intervention .  The patient's history has been reviewed, patient examined, no change in status, stable for surgery.  I have reviewed the patient's chart and labs.  Questions were answered to the patient's satisfaction.     Milus Banister

## 2018-03-22 NOTE — Transfer of Care (Signed)
Immediate Anesthesia Transfer of Care Note  Patient: [name] [name].  Procedure(s) Performed: UPPER ENDOSCOPIC ULTRASOUND (EUS) RADIAL (N/A ) FINE NEEDLE ASPIRATION (FNA) LINEAR (N/A )  Patient Location: PACU  Anesthesia Type:MAC  Level of Consciousness: awake, alert  and oriented  Airway & Oxygen Therapy: Patient Spontanous Breathing and Patient connected to nasal cannula oxygen  Post-op Assessment: Report given to RN and Post -op Vital signs reviewed and stable  Post vital signs: Reviewed and stable  Last Vitals:  Vitals Value Taken Time  BP 108/58 03/22/2018  9:10 AM  Temp    Pulse 73 03/22/2018  9:12 AM  Resp 7 03/22/2018  9:12 AM  SpO2 94 % 03/22/2018  9:12 AM  Vitals shown include unvalidated device data.  Last Pain:  Vitals:   03/22/18 0910  TempSrc:   PainSc: 0-No pain         Complications: No apparent anesthesia complications

## 2018-03-23 ENCOUNTER — Ambulatory Visit (HOSPITAL_COMMUNITY): Payer: Medicare HMO | Admitting: Internal Medicine

## 2018-03-28 ENCOUNTER — Encounter: Payer: Self-pay | Admitting: Nurse Practitioner

## 2018-03-28 ENCOUNTER — Ambulatory Visit: Payer: Medicare HMO | Admitting: Nurse Practitioner

## 2018-03-28 VITALS — BP 153/91 | HR 80 | Temp 97.1°F | Ht 67.0 in | Wt 273.6 lb

## 2018-03-28 DIAGNOSIS — K219 Gastro-esophageal reflux disease without esophagitis: Secondary | ICD-10-CM | POA: Diagnosis not present

## 2018-03-28 DIAGNOSIS — R1032 Left lower quadrant pain: Secondary | ICD-10-CM

## 2018-03-28 DIAGNOSIS — K8689 Other specified diseases of pancreas: Secondary | ICD-10-CM | POA: Diagnosis not present

## 2018-03-28 NOTE — Patient Instructions (Signed)
1. Continue your current medications. 2. Call if you need further constipation medications. 3. Keep your appointment with oncology tomorrow. 4. Return for follow-up in 6 months. 5. Call us if you have any questions or concerns.  At [facility] we value your feedback. You may receive a survey about your visit today. Please share your experience as we strive to create trusting relationships with our patients to provide genuine, compassionate, quality care.  We appreciate your understanding and patience as we review any laboratory studies, imaging, and other diagnostic tests that are ordered as we care for you. Our office policy is 5 business days for review of these results, and any emergent or urgent results are addressed in a timely manner for your best interest. If you do not hear from our office in 1 week, please contact us.   We also encourage the use of MyChart, which contains your medical information for your review as well. If you are not enrolled in this feature, an access code is on this after visit summary for your convenience. Thank you for allowing [name] to be involved in your care.  It was great to see you today!  I hope you have a great Fall!!

## 2018-03-28 NOTE — Progress Notes (Signed)
Referring Provider: Redmond School, MD Primary Care Physician:  Redmond School, MD Primary GI:  Dr. Gala Romney  Chief Complaint  Patient presents with  . Nausea  . Abdominal Pain    mid upper abd to left side    HPI:   [name] Reger. is a 77 y.o. male who presents for follow-up on abdominal pain and nausea.  The patient was last seen in our office 01/16/2018 for abdominal pain and constipation.  CT previously failed to find an etiology.  Ongoing constipation, Amitiza was too strong and produce diarrhea.  History of cholecystectomy.  History of GERD but no recent acid suppression therapy.  He has been waiting on samples of Dexilant.  Bowel movement does not produce improvement in left lower quadrant abdominal pain.  Recommended stop Amitiza, start Linzess.  Trial of Protonix 40 mg daily.  Recommend EGD for further evaluation.  TCS up to date and next due in 2021.  EGD was completed 01/18/2018 which found mild erosive reflux esophagitis, normal stomach, normal duodenum.  Recommended continue current medications, no need for repeat EGD.  Follow-up in 4 weeks.  A repeat CT of the abdomen and pelvis completed 03/06/2018 found a 4 cm mass involving the uncinate process of the pancreas highly suspicious for primary pancreatic malignancy, mildly increased size of multiple regional lymph nodes suspicious for nodal metastasis.  Recommended EUS and labs.  Labs completed 03/21/2018 found normal CEA, significantly elevated CA 19-9 at 2,011.  CMP found elevated bilirubin at 1.6.  EUS completed 03/22/2018 which found identification of a solid mass in the uncinate process of the pancreas measuring 25 mm and found to be hypoechoic and heterogenous with poorly defined borders.  FNA for cytology was performed, small peripancreatic lymph node not sampled, CBD normal in nondilated, main pancreatic duct normal and nondilated.  Preliminary FNA cytology reviewed same day found abnormal cells (likely  well-differentiated adenocarcinoma).  Surgical pathology found the pancreatic mass to be malignant cells consistent with adenocarcinoma.  Appointment was scheduled with the oncology service for tomorrow (03/29/2018).  Today he states he's doing ok. He is dealing with it one day at a time. Isn't sure if he's going to have radiation and/or chemo (will know more tomorrow when he sees Oncology). The pain he was having previously is improved. Had a good bowel movement this morning. Linzess caused diarrhea as well. Is not taking anything, nothing OTC for constipation other than ginger root and Dulcolax. GERD symptoms intermittent and still on Protonix. Still some nausea intermittently until he eats. Doesn't have anything for nausea. Asking for printed Rx for Zofran. Denies any other GI symptoms.  Past Medical History:  Diagnosis Date  . Assistance needed for mobility    cane, walker  . Bilateral lower leg cellulitis   . BPH (benign prostatic hyperplasia)   . Cataract    left  . Chronic diastolic HF (heart failure) ([place])   . Chronic low back pain   . Collagen vascular disease ([place])    history of venous insufficency and LE cellulitis  . Complication of anesthesia    2012 due to sleep apnea pt has had difficulty being put under  and waking up from anesthesia  . Deep venous insufficiency   . GERD (gastroesophageal reflux disease)   . Glaucoma    left  . Headache   . Heel spur    right heel  . History of bladder infections   . History of colonic polyps   . Hyperlipidemia   .  Hypertension    Sees Dr. Debara Pickett  . Hypothyroidism    hypothyroid denies no rx  . Iron deficiency 07/07/2016  . Kidney stones    passed them  . Obesity   . Osteoarthritis   . Pancreatic cancer ([place])   . PONV (postoperative nausea and vomiting)   . PUD (peptic ulcer disease)   . Ringing in ears   . S/P colonoscopy 2007, Feb and March 2012   hx of adenomas, left-sided diverticula, On 07/2010 TCS, fresh blood and clot  noted coming from TI.  . S/P endoscopy 2007, 2012   2007: nl, May 2012: antral erosions  . SIRS (systemic inflammatory response syndrome) ([place]) 02/27/2012  . Sleep apnea    MARJORITY OF TIME DOES NOT USE CPAP PER SON   . 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 BACK SURGERY    . BACK SURGERY  12/12/2017  . CARDIAC CATHETERIZATION  02/27/2007   no sign of CAD, LVH, mod pulm HTN (Dr. Jackie Plum(  . CATARACT EXTRACTION W/ INTRAOCULAR LENS IMPLANT Right ~ 2012  . COLONOSCOPY N/A 05/28/2014   TDV:VOHYWVPX colonic polyps/colonic diverticulosis. Tubular adenomas. Next colonoscopy January 2021  . ESOPHAGOGASTRODUODENOSCOPY  Aug 2013   [facility], single 43mm pedunculated polyp otherwise normal. HYPERPLASTIC, NEGATIVE h.pylori  . ESOPHAGOGASTRODUODENOSCOPY N/A 05/28/2014   RMR:non-critical Schatzki's ring/HH. Benign gastritis  . ESOPHAGOGASTRODUODENOSCOPY N/A 03/22/2018   Procedure: ESOPHAGOGASTRODUODENOSCOPY (EGD);  Surgeon: Milus Banister, MD;  Location: [facility];  Service: Endoscopy;  Laterality: N/A;  . ESOPHAGOGASTRODUODENOSCOPY (EGD) WITH PROPOFOL N/A 01/18/2018   Procedure: ESOPHAGOGASTRODUODENOSCOPY (EGD) WITH PROPOFOL;  Surgeon: Daneil Dolin, MD;  [facility]: [facility];  Service: Endoscopy;  Laterality: N/A;  1:45pm  . EUS N/A 03/22/2018   Procedure: UPPER ENDOSCOPIC ULTRASOUND (EUS) RADIAL;  Surgeon: Milus Banister, MD;  Location: [facility];  Service: Endoscopy;  Laterality: N/A;  . FINE NEEDLE ASPIRATION N/A 03/22/2018   Procedure: FINE NEEDLE ASPIRATION (FNA) LINEAR;  Surgeon: Milus Banister, MD;  Location: [facility];  Service: Endoscopy;  Laterality: N/A;  . INGUINAL HERNIA REPAIR Right 1941  . KNEE ARTHROSCOPY Left ~ 2000  . LAPAROSCOPIC CHOLECYSTECTOMY    . [place] SURGERY  2012  . MALONEY DILATION N/A 05/28/2014   Procedure: Venia Minks DILATION;  Surgeon: Daneil Dolin, MD;  [facility];  Service:  Endoscopy;  Laterality: N/A;  . NASAL SEPTOPLASTY W/ TURBINOPLASTY Bilateral 10/07/2015   Procedure: NASAL SEPTOPLASTY WITH TURBINATE REDUCTION;  Surgeon: Leta Baptist, MD;  Location: [facility];  Service: ENT;  Laterality: Bilateral;  . NASAL SEPTUM SURGERY Bilateral 10/07/2015    inferior turbinate resection  . NM MYOCAR PERF WALL MOTION  12/2006   dipyridamole myoview - stress images show mild perfusion defect in mid inferior walls with reversibility at rest; mild perfusion defect in mid inferolateral wall at stress with mild defect reversibility, EF 62%, abnormal but low risk study   . POSTERIOR LUMBAR FUSION  2015  . SAVORY DILATION N/A 05/28/2014   Procedure: SAVORY DILATION;  Surgeon: Daneil Dolin, MD;  [facility];  Service: Endoscopy;  Laterality: N/A;  . SHOULDER OPEN ROTATOR CUFF REPAIR Left early 2000s  . small bowel capsule study  07/2011   ?transient focal ischemia in distal small bowel to explain GI bleeding  . TRANSTHORACIC ECHOCARDIOGRAM  09/2008   EF 60-65%, mod conc LVH  . TRANSURETHRAL RESECTION OF PROSTATE    . TRANSURETHRAL  RESECTION OF PROSTATE N/A 06/18/2013   Procedure: TRANSURETHRAL RESECTION OF THE PROSTATE (TURP);  Surgeon: Marissa Nestle, MD;  Location: [facility];  Service: Urology;  Laterality: N/A;    Current Outpatient Medications  Medication Sig Dispense Refill  . acetaminophen (TYLENOL) 325 MG tablet Take 2 tablets (650 mg total) by mouth every 6 (six) hours as needed for mild pain (or Fever >/= 101).    . brimonidine (ALPHAGAN) 0.2 % ophthalmic solution Place 1 drop into the right eye 2 (two) times daily.     . Calcium Citrate-Vitamin D (CALCIUM + D PO) Take 1 tablet by mouth daily.    . Cholecalciferol (VITAMIN D3) 1000 units CAPS Take 1,000 Units by mouth daily.     . clopidogrel (PLAVIX) 75 MG tablet Take 1 tablet (75 mg total) by mouth daily. 90 tablet 0  . cyclobenzaprine (FLEXERIL) 10 MG tablet Take 10 mg by mouth 2 (two) times daily as needed for  muscle spasms.    . diclofenac sodium (VOLTAREN) 1 % GEL Apply 4 g 4 (four) times daily as needed topically. (Patient taking differently: Apply 4 g topically at bedtime. ) 2 Tube 3  . docusate sodium (COLACE) 100 MG capsule Take 100 mg by mouth 2 (two) times daily as needed for mild constipation.    . dorzolamide-timolol (COSOPT) 22.3-6.8 MG/ML ophthalmic solution Place 1 drop into both eyes 2 (two) times daily.    . ferrous sulfate 325 (65 FE) MG tablet Take 325 mg by mouth daily with breakfast.    . Ginger, Zingiber officinalis, (GINGER ROOT) 550 MG CAPS Take 550 mg by mouth 2 (two) times daily.    . metoprolol tartrate (LOPRESSOR) 25 MG tablet Take 25 mg by mouth daily.     Marland Kitchen oxyCODONE-acetaminophen (PERCOCET) 10-325 MG tablet Take 1 tablet by mouth 3 (three) times daily.   0  . pantoprazole (PROTONIX) 40 MG tablet Take 40 mg by mouth 2 (two) times daily.    Vladimir Faster Glycol-Propyl Glycol (LUBRICANT EYE DROPS) 0.4-0.3 % SOLN Place 1 drop into both eyes daily as needed (for lubricant eye drops).     . potassium chloride (KLOR-CON) 20 MEQ packet Take 40 mEq by mouth 2 (two) times daily.     . TURMERIC PO Take 1 tablet by mouth daily.      No current facility-administered medications for this visit.     Allergies as of 03/28/2018 - Review Complete 03/28/2018  Allergen Reaction Noted  . Aspirin Other (See Comments)   . Iodinated diagnostic agents Hives and Other (See Comments) 12/20/2017  . Nexium [esomeprazole magnesium] Other (See Comments) 11/14/2013    Family History  Problem Relation Age of Onset  . Colon cancer Father 39       deceased  . Prostate cancer Father   . Pancreatic cancer Mother 10       deceased  . Prostate cancer Brother   . Arthritis Son   . Arthritis Son   . Diabetes Mellitus II Son     Social History   Socioeconomic History  . Marital status: Widowed    Spouse name: Roger Kill  . Number of children: 5  . Years of education: College  . Highest education  level: Not on file  Occupational History  . Occupation: disability    Employer: RETIRED  Social Needs  . Financial resource strain: Not on file  . Food insecurity:    Worry: Not on file    Inability: Not on file  .  Transportation needs:    Medical: Not on file    Non-medical: Not on file  Tobacco Use  . Smoking status: Former Smoker    Years: 14.00    Types: Cigars    Start date: 11/20/1959    Last attempt to quit: 08/10/1973    Years since quitting: 44.6  . Smokeless tobacco: Never Used  Substance and Sexual Activity  . Alcohol use: No    Alcohol/week: 0.0 standard drinks    Comment: denies  . Drug use: No  . Sexual activity: Never    Partners: Male    Birth control/protection: None  Lifestyle  . Physical activity:    Days per week: Not on file    Minutes per session: Not on file  . Stress: Not on file  Relationships  . Social connections:    Talks on phone: Not on file    Gets together: Not on file    Attends religious service: Not on file    Active member of club or organization: Not on file    Attends meetings of clubs or organizations: Not on file    Relationship status: Not on file  Other Topics Concern  . Not on file  Social History Narrative   Patient lives at home with spouse and son.   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     Review of Systems: General: Negative for anorexia, weight loss, fever, chills, fatigue, weakness. ENT: Negative for hoarseness, difficulty swallowing. CV: Negative for chest pain, angina, palpitations, peripheral edema.  Respiratory: Negative for dyspnea at rest, cough, sputum, wheezing.  GI: See history of present illness. MS: Admits chronic pain.  Derm: Negative for rash or itching.  Endo: Negative for unusual weight change.  Heme: Negative for bruising or bleeding. Allergy: Negative for rash or hives.   Physical Exam: BP (!) 153/91   Pulse 80   Temp (!) 97.1 F (36.2 C) (Oral)   Ht  5\' 7"  (1.702 m)   Wt 273 lb 9.6 oz (124.1 kg)   BMI 42.85 kg/m  General:   Alert and oriented. Pleasant and cooperative. Well-nourished and well-developed.  Eyes:  Without icterus, sclera clear and conjunctiva pink.  Ears:  Normal auditory acuity. Cardiovascular:  S1, S2 present without murmurs appreciated. Extremities without clubbing or edema. Respiratory:  Clear to auscultation bilaterally. No wheezes, rales, or rhonchi. No distress.  Gastrointestinal:  +BS, soft, non-tender and non-distended. No HSM noted. No guarding or rebound. No masses appreciated.  Rectal:  Deferred  Musculoskalatal:  Symmetrical without gross deformities. Ambulates with a cane. Neurologic:  Alert and oriented x4;  grossly normal neurologically. Psych:  Alert and cooperative. Normal mood and affect. Heme/Lymph/Immune: No excessive bruising noted.    03/28/2018 8:30 AM   Disclaimer: This note was dictated with voice recognition software. Similar sounding words can inadvertently be transcribed and may not be corrected upon review.

## 2018-03-28 NOTE — Assessment & Plan Note (Signed)
Notable history of chronic GERD symptoms.  Still on Protonix, intermittent symptoms.  He is morbidly obese and this is likely contributing to his symptoms.  EGD recently completed in August of this year which found mild erosive reflux esophagitis and normal stomach.  No need for repeat EGD and recommended continue current medications.  Continue to take Protonix, follow-up in 6 months.  Call if any problems before then.

## 2018-03-28 NOTE — Assessment & Plan Note (Signed)
The patient has had multiple abdominal imaging including 2 CT exams in January and a CT exam in July all of which were normal in regards to his pancreas.  He saw oncology for chronic leukocytosis and they recommended a repeat CT exam and October of this year (essentially 2-1/2 months after his previous normal CT) and is found a 4 cm mass involving the uncanny process of the pancreas highly suspicious for primary pancreatic malignancy as well as mildly increased size of multiple regional lymph nodes suspicious for nodal metastasis.  EUS was completed including FNA for cytology which found well-differentiated adenocarcinoma.  CA 19-9 was significantly elevated at 2,011.  He is due to follow-up with oncology tomorrow.  They will likely formulate a treatment plan at that time.  He was told by 1 of his providers that he is likely not amendable to surgery.  Chemotherapy and radiation are still considerable.  Recommend he continue to follow-up with oncology, follow-up with [name] in 6 months.  Call if any worsening symptoms before then.

## 2018-03-28 NOTE — Assessment & Plan Note (Signed)
We have been seeing him for some time for chronic abdominal pain.  This is felt likely due to, at least in part, to GERD and constipation.  Colonoscopy is up-to-date.  EGD is up-to-date and noted as per HPI.  CT abdomen and pelvis is been completed multiple times this year and the majority of these were unremarkable including 2 CT exams and January and a CT exam in July.  Recommended Linzess for constipation and Protonix for GERD.  Linzess caused diarrhea with as well.  He is taking Dulcolax and gingerroot which controls his symptoms adequately at this time.  Overall the pain we were seeing him for he feels is improved.  Continue current medications, call if any further worsening constipation.  Follow-up in 6 months.

## 2018-03-29 ENCOUNTER — Encounter (HOSPITAL_COMMUNITY): Payer: Self-pay | Admitting: Internal Medicine

## 2018-03-29 ENCOUNTER — Inpatient Hospital Stay (HOSPITAL_COMMUNITY): Payer: Medicare HMO | Attending: Hematology | Admitting: Internal Medicine

## 2018-03-29 VITALS — BP 148/56 | HR 71 | Temp 97.6°F | Resp 18 | Wt 270.0 lb

## 2018-03-29 DIAGNOSIS — D696 Thrombocytopenia, unspecified: Secondary | ICD-10-CM | POA: Diagnosis not present

## 2018-03-29 DIAGNOSIS — C259 Malignant neoplasm of pancreas, unspecified: Secondary | ICD-10-CM | POA: Insufficient documentation

## 2018-03-29 DIAGNOSIS — Z87891 Personal history of nicotine dependence: Secondary | ICD-10-CM | POA: Diagnosis not present

## 2018-03-29 DIAGNOSIS — D72829 Elevated white blood cell count, unspecified: Secondary | ICD-10-CM | POA: Diagnosis not present

## 2018-03-29 DIAGNOSIS — Z800 Family history of malignant neoplasm of digestive organs: Secondary | ICD-10-CM | POA: Diagnosis not present

## 2018-03-29 DIAGNOSIS — C258 Malignant neoplasm of overlapping sites of pancreas: Secondary | ICD-10-CM

## 2018-03-29 DIAGNOSIS — Z79899 Other long term (current) drug therapy: Secondary | ICD-10-CM | POA: Diagnosis not present

## 2018-03-29 NOTE — Progress Notes (Signed)
Diagnosis No diagnosis found.  Staging Cancer Staging No matching staging information was found for the patient.  Assessment and Plan:  1. Pancreatic adenocarcinoma.   Pt has longstanding history of back pain.  SPEP negative, Sed rate 2, RA < 10 all WNL.  He was set up for CT of the abdomen and pelvis that was done on 03/06/2018 that was reviewed and showed  IMPRESSION: 1. 4 cm mass involving the uncinate process of the pancreas, highly suspicious for primary pancreatic malignancy. 2. Mildly increased size of multiple regional lymph nodes suspicious for nodal metastases. 3.  Aortic Atherosclerosis (ICD10-I70.0).  Pt was seen by Dr. Ardis Hughs for EUS which was done on 03/22/2018 with pathology returning as  [place] done 03/21/2018 reviewed and showed WBC 9.8 HB 14.8 and plts 99,000.  Chemistries WNL with K+ 4.4 Cr 1.08 and normal LFTs.  CEA normal at 3.3.  CA 19-9 elevated at 2, 011.    Long talk held with pt and son today regarding diagnosis. I have discussed with him pts with pancreatic cancer are referred for surgical evaluation and are usually recommended for chemotherapy with reevaluation for possible surgery.  Options of treatment discussed which could include Folfirinox versus Gemzar and abraxane.  Based on comorbidity, he would  be recommended for Gemzar and abraxane therapy.  Will discuss case with Dr. Harl Bowie his cardiologist.  Pt will be referred to Dr. Barry Dienes for surgical evaluation and for port placement.   Goals of therapy discussed with pt due to pancreatic cancer being an aggressive malignancy with poor 5 year survival rates. All questions answered and pt was provided NCCN guideline information.    2.  Family history of pancreatic cancer.  Pt reports this occurred in his mother.  He is referred for genetic evaluation.    3.  Leukocytosis, neutrophilia.  WBC 9.8 on labs done 03/21/2018.  Likely reactive.  He had previous evaluation  with a negative BCR/ABL  and normal sed rate, C-reactive protein,.  He also had a negative Jak 2 evaluation.    4.  Thrombocytopenia.  Plt count stable at 99.000 on labs done 03/21/2018.  CT chest abdomen and pelvis done 03/06/2018 showed normal spleen and ? Liver hemagioma.   Hepatitis and HIV testing negative.  Will follow labs closely once therapy proceeds.    3.  Colon lesion.  CT OF ABDOMEN AND PELVIS DONE 05/27/2017 showed  IMPRESSION: 1. Focal inflamed masslike area involving the cecum at the level of the terminal ileum. Findings may represent focal cecal diverticulitis, an infectious or inflammatory process or potentially cecal mass/neoplastic process. Recommend clinical and laboratory correlation. Additionally, patient will need further evaluation with colonoscopy after resolution of the acute symptomatology. 2. Mild wall thickening of the rectum, potentially sequelae of infectious process. 3. Wall thickening of the urinary bladder raising the possibility of cystitis. 4. Re- demonstrated slowly enlarging soft tissue mass within the presacral location. Recommend further evaluation with pelvic MRI. 5. Mild splenomegaly.  Was seen by GI and had EGD done 01/18/2018 that was negative.  Follow-up with GI as recommended.    4. Presacral mass.   MRI of the pelvis done 05/30/2017 shows:   IMPRESSION: 1. 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 Trace edema in the adjacent fascia plane with the perirectal space. There was some faint nodularity in this vicinity in 2015 but no abnormality in the area prior to  that. The lesion has slowly enlarged and seems to have a potentially linear fatty element extending sagittally in the right paracentral position. Given the fatty element, possibilities may include myelolipoma, extramedullary hematopoiesis, solitary fibrous tumor, low-grade lymphoma, or soft tissue  hemangioma. Given the slow growth and fatty elements this would be unusual for metastatic disease. Appearance not considered characteristic for chordoma and would be unusual for a neurogenic origin tumor such as schwannoma based on the intermediate T2 signal. Consider surveillance or resection.  Pt has undergone several back surgeries.  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  Follow-up with neurosurgery as recommended.   Greater than 25  minutes spent with more than 50% spent in counseling and coordination of care.    Interval History:  Historical data obtained from note dated 08/09/2017.  77 year old male followed by Dr. Talbert Cage  for leukocytosis and thrombocytopenia.  He has undergone evaluation with normal BCR/ABL,  normal sed rate, C-reactive protein, negative Jak2.  Current Status: Patient is seen today for follow-up.  He is here to go over path from recent biopsy.     Problem List Patient Active Problem List   Diagnosis Date Noted  . Pancreatic mass [K86.89]   . Abdominal pain [R10.9] 12/20/2017  . Diverticulitis of colon [K57.32] 06/28/2017  . Gastrointestinal hemorrhage with melena [K92.1] 05/27/2017  . Acute cystitis [N30.00] 05/27/2017  . Pelvic mass in male [R19.00] 05/27/2017  . Acute diverticulitis [K57.92] 05/27/2017  . Heme positive stool [R19.5] 05/27/2017  . Prostatitis [N41.9] 05/27/2017  . S/P colonoscopy [Z98.890] 05/27/2017  . Bilateral primary osteoarthritis of knee [M17.0] 04/06/2017  . Weakness [R53.1] 02/27/2017  . Ventral hernia [K43.9] 02/27/2017  . Acute CVA (cerebrovascular accident) ([place]) [I63.9] 09/13/2016  . Stroke (cerebrum) ([place]) [I63.9] 09/13/2016  . Syncope [R55] 09/12/2016  . Iron deficiency [E61.1] 07/07/2016  . Back pain [M54.9] 03/22/2016  . Acute right-sided low back pain with right-sided sciatica [M54.41]   . S/P nasal septoplasty [Z98.890] 10/07/2015  .  Parotitis, acute [K11.21] 03/06/2015  . Nausea without vomiting [R11.0] 12/08/2014  . Dysphagia, pharyngoesophageal phase [R13.14]   . History of colonic polyps [Z86.010]   . Hypokalemia [E87.6] 09/01/2013  . Dehydration [E86.0] 09/01/2013  . Leukocytosis [D72.829] 09/01/2013  . Diastolic dysfunction by echo Dec 2014 [I51.89] 07/30/2013  . Morbid obesity- BMI 44.5 [E66.01] 07/30/2013  . Edema of both legs [R60.0] 07/30/2013  . Bilateral lower leg cellulitis [L03.116, L03.115] 07/18/2013  . 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 OSA (obstructive sleep apnea) [G47.33] 11/27/2012  . Other spondylosis with radiculopathy, lumbar region [facility] 08/22/2012  . Bright red blood per rectum [K62.5] 06/15/2012  . Constipation [K59.00] 02/29/2012  . SIRS (systemic inflammatory response syndrome) (HCC) [R65.10] 02/27/2012  . Hyperlipidemia [E78.5] 02/27/2012  . Thrombocytopenia ([place]) [D69.6] 12/30/2011  . Hypothyroidism [E03.9] 10/27/2011  . Upper abdominal pain [R10.10] 10/27/2011  . Rectal bleeding [K62.5] 11/16/2010  . DIARRHEA [R19.7] 06/08/2010  . COLONIC POLYPS, ADENOMATOUS, HX OF [Z86.010] 07/16/2009  . Essential hypertension [I10] 06/19/2009  . GASTROESOPHAGEAL REFLUX DISEASE, CHRONIC [K21.9] 06/19/2009  . FATTY LIVER DISEASE [K76.89] 06/19/2009  . CHOLELITHIASIS, SYMPTOMATIC [K80.20] 06/19/2009  . ABDOMINAL BLOATING [R14.3, R14.1, R14.2] 06/19/2009    Past Medical History Past Medical History:  Diagnosis Date  . Assistance needed for mobility    cane, walker  . Bilateral lower leg cellulitis   . BPH (benign prostatic hyperplasia)   . Cataract  left  . Chronic diastolic HF (heart failure) ([place])   . Chronic low back pain   . Collagen vascular disease ([place])    history of venous insufficency and LE cellulitis  . Complication of anesthesia    2012 due to sleep apnea pt has had difficulty being put under  and waking up from anesthesia  . Deep venous insufficiency    . GERD (gastroesophageal reflux disease)   . Glaucoma    left  . Headache   . Heel spur    right heel  . History of bladder infections   . History of colonic polyps   . Hyperlipidemia   . Hypertension    Sees Dr. Debara Pickett  . Hypothyroidism    hypothyroid denies no rx  . Iron deficiency 07/07/2016  . Kidney stones    passed them  . Obesity   . Osteoarthritis   . Pancreatic cancer ([place])   . PONV (postoperative nausea and vomiting)   . PUD (peptic ulcer disease)   . Ringing in ears   . S/P colonoscopy 2007, Feb and March 2012   hx of adenomas, left-sided diverticula, On 07/2010 TCS, fresh blood and clot noted coming from TI.  . S/P endoscopy 2007, 2012   2007: nl, May 2012: antral erosions  . SIRS (systemic inflammatory response syndrome) ([place]) 02/27/2012  . Sleep apnea    MARJORITY OF TIME DOES NOT USE CPAP PER SON   . Thrombocytopenia ([place]) 12/30/2011   Stable    Past Surgical History Past Surgical History:  Procedure Laterality Date  . BACK SURGERY    . BACK SURGERY  12/12/2017  . CARDIAC CATHETERIZATION  02/27/2007   no sign of CAD, LVH, mod pulm HTN (Dr. Jackie Plum(  . CATARACT EXTRACTION W/ INTRAOCULAR LENS IMPLANT Right ~ 2012  . COLONOSCOPY N/A 05/28/2014   DIY:MEBRAXEN colonic polyps/colonic diverticulosis. Tubular adenomas. Next colonoscopy January 2021  . ESOPHAGOGASTRODUODENOSCOPY  Aug 2013   [facility], single 47m pedunculated polyp otherwise normal. HYPERPLASTIC, NEGATIVE h.pylori  . ESOPHAGOGASTRODUODENOSCOPY N/A 05/28/2014   RMR:non-critical Schatzki's ring/HH. Benign gastritis  . ESOPHAGOGASTRODUODENOSCOPY N/A 03/22/2018   Procedure: ESOPHAGOGASTRODUODENOSCOPY (EGD);  Surgeon: JMilus Banister MD;  [facility];  Service: Endoscopy;  Laterality: N/A;  . ESOPHAGOGASTRODUODENOSCOPY (EGD) WITH PROPOFOL N/A 01/18/2018   Procedure: ESOPHAGOGASTRODUODENOSCOPY (EGD) WITH PROPOFOL;  Surgeon: RDaneil Dolin MD;  [facility];  Service: Endoscopy;   Laterality: N/A;  1:45pm  . EUS N/A 03/22/2018   Procedure: UPPER ENDOSCOPIC ULTRASOUND (EUS) RADIAL;  Surgeon: JMilus Banister MD;  [facility];  Service: Endoscopy;  Laterality: N/A;  . FINE NEEDLE ASPIRATION N/A 03/22/2018   Procedure: FINE NEEDLE ASPIRATION (FNA) LINEAR;  Surgeon: JMilus Banister MD;  [facility];  Service: Endoscopy;  Laterality: N/A;  . INGUINAL HERNIA REPAIR Right 1941  . KNEE ARTHROSCOPY Left ~ 2000  . LAPAROSCOPIC CHOLECYSTECTOMY    . LMitchellSURGERY  2012  . MALONEY DILATION N/A 05/28/2014   Procedure: MVenia MinksDILATION;  Surgeon: RDaneil Dolin MD;  [facility];  Service: Endoscopy;  Laterality: N/A;  . NASAL SEPTOPLASTY W/ TURBINOPLASTY Bilateral 10/07/2015   Procedure: NASAL SEPTOPLASTY WITH TURBINATE REDUCTION;  Surgeon: SLeta Baptist MD;  [facility]  Service: ENT;  Laterality: Bilateral;  . NASAL SEPTUM SURGERY Bilateral 10/07/2015    inferior turbinate resection  . NM MYOCAR PERF WALL MOTION  12/2006   dipyridamole myoview - stress images show mild perfusion defect in mid inferior walls  with reversibility at rest; mild perfusion defect in mid inferolateral wall at stress with mild defect reversibility, EF 62%, abnormal but low risk study   . POSTERIOR LUMBAR FUSION  2015  . SAVORY DILATION N/A 05/28/2014   Procedure: SAVORY DILATION;  Surgeon: Daneil Dolin, MD;  [facility];  Service: Endoscopy;  Laterality: N/A;  . SHOULDER OPEN ROTATOR CUFF REPAIR Left early 2000s  . small bowel capsule study  07/2011   ?transient focal ischemia in distal small bowel to explain GI bleeding  . TRANSTHORACIC ECHOCARDIOGRAM  09/2008   EF 60-65%, mod conc LVH  . TRANSURETHRAL RESECTION OF PROSTATE    . TRANSURETHRAL RESECTION OF PROSTATE N/A 06/18/2013   Procedure: TRANSURETHRAL RESECTION OF THE PROSTATE (TURP);  Surgeon: Marissa Nestle, MD;  Location: [facility];  Service: Urology;  Laterality: N/A;    Family History Family  History  Problem Relation Age of Onset  . Colon cancer Father 53       deceased  . Prostate cancer Father   . Pancreatic cancer Mother 103       deceased  . Prostate cancer Brother   . Arthritis Son   . Arthritis Son   . Diabetes Mellitus II Son      Social History  reports that he quit smoking about 44 years ago. His smoking use included cigars. He started smoking about 58 years ago. He quit after 14.00 years of use. He has never used smokeless tobacco. He reports that he does not drink alcohol or use drugs.  Medications  Current Outpatient Medications:  .  acetaminophen (TYLENOL) 325 MG tablet, Take 2 tablets (650 mg total) by mouth every 6 (six) hours as needed for mild pain (or Fever >/= 101)., Disp: , Rfl:  .  brimonidine (ALPHAGAN) 0.2 % ophthalmic solution, Place 1 drop into the right eye 2 (two) times daily. , Disp: , Rfl:  .  Calcium Citrate-Vitamin D (CALCIUM + D PO), Take 1 tablet by mouth daily., Disp: , Rfl:  .  Cholecalciferol (VITAMIN D3) 1000 units CAPS, Take 1,000 Units by mouth daily. , Disp: , Rfl:  .  clopidogrel (PLAVIX) 75 MG tablet, Take 1 tablet (75 mg total) by mouth daily., Disp: 90 tablet, Rfl: 0 .  cyclobenzaprine (FLEXERIL) 10 MG tablet, Take 10 mg by mouth 2 (two) times daily as needed for muscle spasms., Disp: , Rfl:  .  diclofenac sodium (VOLTAREN) 1 % GEL, Apply 4 g 4 (four) times daily as needed topically. (Patient taking differently: Apply 4 g topically at bedtime. ), Disp: 2 Tube, Rfl: 3 .  docusate sodium (COLACE) 100 MG capsule, Take 100 mg by mouth 2 (two) times daily as needed for mild constipation., Disp: , Rfl:  .  dorzolamide-timolol (COSOPT) 22.3-6.8 MG/ML ophthalmic solution, Place 1 drop into both eyes 2 (two) times daily., Disp: , Rfl:  .  ferrous sulfate 325 (65 FE) MG tablet, Take 325 mg by mouth daily with breakfast., Disp: , Rfl:  .  Ginger, Zingiber officinalis, (GINGER ROOT) 550 MG CAPS, Take 550 mg by mouth 2 (two) times daily., Disp:  , Rfl:  .  metoprolol tartrate (LOPRESSOR) 25 MG tablet, Take 25 mg by mouth daily. , Disp: , Rfl:  .  oxyCODONE-acetaminophen (PERCOCET) 10-325 MG tablet, Take 1 tablet by mouth 3 (three) times daily. , Disp: , Rfl: 0 .  pantoprazole (PROTONIX) 40 MG tablet, Take 40 mg by mouth 2 (two) times daily., Disp: , Rfl:  .  Polyethyl Glycol-Propyl Glycol (LUBRICANT EYE DROPS) 0.4-0.3 % SOLN, Place 1 drop into both eyes daily as needed (for lubricant eye drops). , Disp: , Rfl:  .  potassium chloride (KLOR-CON) 20 MEQ packet, Take 40 mEq by mouth 2 (two) times daily. , Disp: , Rfl:  .  TURMERIC PO, Take 1 tablet by mouth daily. , Disp: , Rfl:   Allergies Aspirin; Iodinated diagnostic agents; and Nexium [esomeprazole magnesium]  Review of Systems Review of Systems - Oncology ROS negative other than back pain.     Physical Exam  Vitals Wt Readings from Last 3 Encounters:  03/29/18 270 lb (122.5 kg)  03/28/18 273 lb 9.6 oz (124.1 kg)  03/22/18 265 lb 3.4 oz (120.3 kg)   Temp Readings from Last 3 Encounters:  03/29/18 97.6 F (36.4 C) (Oral)  03/28/18 (!) 97.1 F (36.2 C) (Oral)  03/22/18 97.7 F (36.5 C) (Oral)   BP Readings from Last 3 Encounters:  03/29/18 (!) 148/56  03/28/18 (!) 153/91  03/22/18 (!) 145/68   Pulse Readings from Last 3 Encounters:  03/29/18 71  03/28/18 80  03/22/18 72   Constitutional: Well-developed, well-nourished, and in no distress.   HENT: Head: Normocephalic and atraumatic.  Mouth/Throat: No oropharyngeal exudate. Mucosa moist. Eyes: Pupils are equal, round, and reactive to light. Conjunctivae are normal. No scleral icterus.  Neck: Normal range of motion. Neck supple. No JVD present.  Cardiovascular: Normal rate, regular rhythm and normal heart sounds.  Exam reveals no gallop and no friction rub.   No murmur heard. Pulmonary/Chest: Effort normal and breath sounds normal. No respiratory distress. No wheezes.No rales.  Abdominal: Soft. Bowel sounds are  normal. No distension. There is no tenderness. There is no guarding.  Musculoskeletal: No edema or tenderness.  Lymphadenopathy: No cervical, axillary or supraclavicular adenopathy.  Neurological: Alert and oriented to person, place, and time. No cranial nerve deficit.  Skin: Skin is warm and dry. No rash noted. No erythema. No pallor.  Psychiatric: Affect and judgment normal.   Labs No visits with results within 3 Day(s) from this visit.  Latest known visit with results is:  Appointment on 03/21/2018  Component Date Value Ref Range Status  . CEA 03/21/2018 3.3  0.0 - 4.7 ng/mL Final   Comment: (NOTE)                             Nonsmokers          <3.9                             Smokers             <5.6 Roche [employer] Electrochemiluminescence Immunoassay (ECLIA) Values obtained with different assay methods or kits cannot be used interchangeably.  Results cannot be interpreted as absolute evidence of the presence or absence of malignant disease. Performed At: [facility] [place], [place] [id number] Rush Farmer MD [id number]   . CA 19-9 03/21/2018 2,011* 0 - 35 U/mL Final   Comment: (NOTE) Results confirmed on dilution. **Verified by repeat analysis** [employer] Electrochemiluminescence Immunoassay (ECLIA) Values obtained with different assay methods or kits cannot be used interchangeably.  Results cannot be interpreted as absolute evidence of the presence or absence of malignant disease. Performed At: [facility] [place], [place] [id number] Rush Farmer MD [id number]   . LDH 03/21/2018 131  98 -  192 U/L Final   Performed at [facility], [address], [place], [place] [address]  . Sodium 03/21/2018 139  135 - 145 mmol/L Final  . Potassium 03/21/2018 4.4  3.5 - 5.1 mmol/L Final  . Chloride 03/21/2018 105  98 - 111 mmol/L Final  . CO2 03/21/2018 26  22 - 32 mmol/L Final  . Glucose, Bld 03/21/2018 106* 70  - 99 mg/dL Final  . BUN 03/21/2018 15  8 - 23 mg/dL Final  . Creatinine, Ser 03/21/2018 1.08  0.61 - 1.24 mg/dL Final  . Calcium 03/21/2018 9.4  8.9 - 10.3 mg/dL Final  . Total Protein 03/21/2018 6.3* 6.5 - 8.1 g/dL Final  . Albumin 03/21/2018 3.7  3.5 - 5.0 g/dL Final  . AST 03/21/2018 14* 15 - 41 U/L Final  . ALT 03/21/2018 10  0 - 44 U/L Final  . Alkaline Phosphatase 03/21/2018 65  38 - 126 U/L Final  . Total Bilirubin 03/21/2018 1.6* 0.3 - 1.2 mg/dL Final  . GFR calc non Af Amer 03/21/2018 >60  >60 mL/min Final  . GFR calc Af Amer 03/21/2018 >60  >60 mL/min Final   Comment: (NOTE) The eGFR has been calculated using the CKD EPI equation. This calculation has not been validated in all clinical situations. eGFR's persistently <60 mL/min signify possible Chronic Kidney Disease.   Georgiann Hahn gap 03/21/2018 8  5 - 15 Final   Performed at [facility], [address], [place], [place] [address]  . WBC 03/21/2018 9.8  4.0 - 10.5 K/uL Final  . RBC 03/21/2018 5.64  4.22 - 5.81 MIL/uL Final  . Hemoglobin 03/21/2018 14.8  13.0 - 17.0 g/dL Final  . HCT 03/21/2018 46.6  39.0 - 52.0 % Final  . MCV 03/21/2018 82.6  80.0 - 100.0 fL Final  . MCH 03/21/2018 26.2  26.0 - 34.0 pg Final  . MCHC 03/21/2018 31.8  30.0 - 36.0 g/dL Final  . RDW 03/21/2018 17.5* 11.5 - 15.5 % Final  . Platelets 03/21/2018 99* 150 - 400 K/uL Final   Comment: PLATELET COUNT CONFIRMED BY SMEAR SPECIMEN CHECKED FOR CLOTS Immature Platelet Fraction may be clinically indicated, consider ordering this additional test [id number]   . nRBC 03/21/2018 0.0  0.0 - 0.2 % Final  . Neutrophils Relative % 03/21/2018 76  % Final  . Neutro Abs 03/21/2018 7.6  1.7 - 7.7 K/uL Final  . Lymphocytes Relative 03/21/2018 7  % Final  . Lymphs Abs 03/21/2018 0.7  0.7 - 4.0 K/uL Final  . Monocytes Relative 03/21/2018 10  % Final  . Monocytes Absolute 03/21/2018 0.9  0.1 - 1.0 K/uL Final  . Eosinophils Relative 03/21/2018 1  % Final  . Eosinophils  Absolute 03/21/2018 0.1  0.0 - 0.5 K/uL Final  . Basophils Relative 03/21/2018 1  % Final  . Basophils Absolute 03/21/2018 0.1  0.0 - 0.1 K/uL Final  . Immature Granulocytes 03/21/2018 5  % Final  . Abs Immature Granulocytes 03/21/2018 0.46* 0.00 - 0.07 K/uL Final   Performed at [facility], [address] [place], [place] [address]     Pathology No orders of the defined types were placed in this encounter.      Zoila Shutter MD

## 2018-03-29 NOTE — Progress Notes (Signed)
cc'd to pcp 

## 2018-03-30 ENCOUNTER — Encounter (HOSPITAL_COMMUNITY): Payer: Self-pay | Admitting: Internal Medicine

## 2018-03-30 ENCOUNTER — Telehealth (HOSPITAL_COMMUNITY): Payer: Self-pay | Admitting: Internal Medicine

## 2018-03-30 NOTE — Telephone Encounter (Signed)
Spoke with pt's son Evette Doffing at [phone] at pt's request regarding discussion with cardiologist Dr. Harl Bowie.  PT will be referred to Dr. Barry Dienes for surgical evaluation and port placement due to recent diagnosis of pancreatic cancer.  He will also be referred to genetic counselor due to mother reportedly also being affected by pancreatic cancer.  Son expressed understanding of the information presented.

## 2018-03-30 NOTE — Progress Notes (Signed)
Referred patient to [facility] .  Appointment with Dr Barry Dienes on 04/06/18 @ 9:45 (9AM arrival).  Spoke with Evette Doffing, patient's son regarding appointment details.  Faxed patient records to Attn: Sarah [phone].

## 2018-04-03 ENCOUNTER — Telehealth: Payer: Self-pay | Admitting: Internal Medicine

## 2018-04-03 MED ORDER — PANTOPRAZOLE SODIUM 40 MG PO TBEC: 40.0000 mg | DELAYED_RELEASE_TABLET | Freq: Two times a day (BID) | ORAL | 3 refills | Status: AC

## 2018-04-03 NOTE — Telephone Encounter (Signed)
Pt was seen last week and wants a refill of Protonix sent to his pharmacy.

## 2018-04-03 NOTE — Telephone Encounter (Signed)
Done. Durene Cal to [facility].

## 2018-04-03 NOTE — Telephone Encounter (Signed)
Pt needs a prescription of Protonix called into [facility]

## 2018-04-04 DIAGNOSIS — Z6841 Body Mass Index (BMI) 40.0 and over, adult: Secondary | ICD-10-CM | POA: Diagnosis not present

## 2018-04-04 DIAGNOSIS — Z23 Encounter for immunization: Secondary | ICD-10-CM | POA: Diagnosis not present

## 2018-04-04 DIAGNOSIS — E119 Type 2 diabetes mellitus without complications: Secondary | ICD-10-CM | POA: Diagnosis not present

## 2018-04-04 DIAGNOSIS — C252 Malignant neoplasm of tail of pancreas: Secondary | ICD-10-CM | POA: Diagnosis not present

## 2018-04-04 DIAGNOSIS — G894 Chronic pain syndrome: Secondary | ICD-10-CM | POA: Diagnosis not present

## 2018-04-04 DIAGNOSIS — I10 Essential (primary) hypertension: Secondary | ICD-10-CM | POA: Diagnosis not present

## 2018-04-05 ENCOUNTER — Encounter (HOSPITAL_COMMUNITY): Payer: Self-pay | Admitting: Genetic Counselor

## 2018-04-05 ENCOUNTER — Inpatient Hospital Stay (HOSPITAL_COMMUNITY): Payer: Medicare HMO

## 2018-04-05 ENCOUNTER — Inpatient Hospital Stay (HOSPITAL_BASED_OUTPATIENT_CLINIC_OR_DEPARTMENT_OTHER): Payer: Medicare HMO | Admitting: Genetic Counselor

## 2018-04-05 DIAGNOSIS — Z860100 Personal history of colon polyps, unspecified: Secondary | ICD-10-CM

## 2018-04-05 DIAGNOSIS — Z800 Family history of malignant neoplasm of digestive organs: Secondary | ICD-10-CM

## 2018-04-05 DIAGNOSIS — C259 Malignant neoplasm of pancreas, unspecified: Secondary | ICD-10-CM

## 2018-04-05 DIAGNOSIS — Z8042 Family history of malignant neoplasm of prostate: Secondary | ICD-10-CM | POA: Diagnosis not present

## 2018-04-05 NOTE — Progress Notes (Signed)
REFERRING PROVIDER: Zoila Shutter, MD [address], [place] [address]  PRIMARY PROVIDER:  Redmond School, MD  PRIMARY REASON FOR VISIT:  1. COLONIC POLYPS, ADENOMATOUS, HX OF   2. Family history of colon cancer   3. Family history of prostate cancer   4. Family history of pancreatic cancer   5. Malignant neoplasm of pancreas, unspecified location of malignancy ([place])      HISTORY OF PRESENT ILLNESS:   [name] [name], a 77 y.o. male, was seen for a [facility] cancer genetics consultation at the request of Dr. Walden Field due to a personal and family history of cancer.  [name] [name] presents to clinic today to discuss the possibility of a hereditary predisposition to cancer, genetic testing, and to further clarify his future cancer risks, as well as potential cancer risks for family members.   In 2019, at the age of 66, [name] [name] was diagnosed with pancreatic cancer. [name] [name] has declined treatment, and has recommended a referral to Hospice.     CANCER HISTORY:   No history exists.     Past Medical History:  Diagnosis Date  . Assistance needed for mobility    cane, walker  . Bilateral lower leg cellulitis   . BPH (benign prostatic hyperplasia)   . Cataract    left  . Chronic diastolic HF (heart failure) ([place])   . Chronic low back pain   . Collagen vascular disease ([place])    history of venous insufficency and LE cellulitis  . Complication of anesthesia    2012 due to sleep apnea pt has had difficulty being put under  and waking up from anesthesia  . Deep venous insufficiency   . Family history of colon cancer   . Family history of pancreatic cancer   . Family history of prostate cancer   . GERD (gastroesophageal reflux disease)   . Glaucoma    left  . Headache   . Heel spur    right heel  . History of bladder infections   . History of colonic polyps   . Hyperlipidemia   . Hypertension    Sees Dr. Debara Pickett  . Hypothyroidism    hypothyroid denies no rx  . Iron  deficiency 07/07/2016  . Kidney stones    passed them  . Obesity   . Osteoarthritis   . Pancreatic cancer ([place])   . PONV (postoperative nausea and vomiting)   . PUD (peptic ulcer disease)   . Ringing in ears   . S/P colonoscopy 2007, Feb and March 2012   hx of adenomas, left-sided diverticula, On 07/2010 TCS, fresh blood and clot noted coming from TI.  . S/P endoscopy 2007, 2012   2007: nl, May 2012: antral erosions  . SIRS (systemic inflammatory response syndrome) ([facility]) 02/27/2012  . Sleep apnea    MARJORITY OF TIME DOES NOT USE CPAP PER SON   . Thrombocytopenia ([place]) 12/30/2011   Stable    Past Surgical History:  Procedure Laterality Date  . BACK SURGERY    . BACK SURGERY  12/12/2017  . CARDIAC CATHETERIZATION  02/27/2007   no sign of CAD, LVH, mod pulm HTN (Dr. Jackie Plum(  . CATARACT EXTRACTION W/ INTRAOCULAR LENS IMPLANT Right ~ 2012  . COLONOSCOPY N/A 05/28/2014   PYP:PJKDTOIZ colonic polyps/colonic diverticulosis. Tubular adenomas. Next colonoscopy January 2021  . ESOPHAGOGASTRODUODENOSCOPY  Aug 2013   [facility], single 68m pedunculated polyp otherwise normal. HYPERPLASTIC, NEGATIVE h.pylori  . ESOPHAGOGASTRODUODENOSCOPY N/A 05/28/2014   RMR:non-critical Schatzki's ring/HH. Benign  gastritis  . ESOPHAGOGASTRODUODENOSCOPY N/A 03/22/2018   Procedure: ESOPHAGOGASTRODUODENOSCOPY (EGD);  Surgeon: Milus Banister, MD;  Location: [facility];  Service: Endoscopy;  Laterality: N/A;  . ESOPHAGOGASTRODUODENOSCOPY (EGD) WITH PROPOFOL N/A 01/18/2018   Procedure: ESOPHAGOGASTRODUODENOSCOPY (EGD) WITH PROPOFOL;  Surgeon: Daneil Dolin, MD;  [facility];  Service: Endoscopy;  Laterality: N/A;  1:45pm  . EUS N/A 03/22/2018   Procedure: UPPER ENDOSCOPIC ULTRASOUND (EUS) RADIAL;  Surgeon: Milus Banister, MD;  Location: [facility];  Service: Endoscopy;  Laterality: N/A;  . FINE NEEDLE ASPIRATION N/A 03/22/2018   Procedure: FINE NEEDLE ASPIRATION (FNA) LINEAR;  Surgeon: Milus Banister,  MD;  Location: [facility];  Service: Endoscopy;  Laterality: N/A;  . INGUINAL HERNIA REPAIR Right 1941  . KNEE ARTHROSCOPY Left ~ 2000  . LAPAROSCOPIC CHOLECYSTECTOMY    . [place] SURGERY  2012  . MALONEY DILATION N/A 05/28/2014   Procedure: Venia Minks DILATION;  Surgeon: Daneil Dolin, MD;  [facility];  Service: Endoscopy;  Laterality: N/A;  . NASAL SEPTOPLASTY W/ TURBINOPLASTY Bilateral 10/07/2015   Procedure: NASAL SEPTOPLASTY WITH TURBINATE REDUCTION;  Surgeon: Leta Baptist, MD;  Location: [facility];  Service: ENT;  Laterality: Bilateral;  . NASAL SEPTUM SURGERY Bilateral 10/07/2015    inferior turbinate resection  . NM MYOCAR PERF WALL MOTION  12/2006   dipyridamole myoview - stress images show mild perfusion defect in mid inferior walls with reversibility at rest; mild perfusion defect in mid inferolateral wall at stress with mild defect reversibility, EF 62%, abnormal but low risk study   . POSTERIOR LUMBAR FUSION  2015  . SAVORY DILATION N/A 05/28/2014   Procedure: SAVORY DILATION;  Surgeon: Daneil Dolin, MD;  [facility];  Service: Endoscopy;  Laterality: N/A;  . SHOULDER OPEN ROTATOR CUFF REPAIR Left early 2000s  . small bowel capsule study  07/2011   ?transient focal ischemia in distal small bowel to explain GI bleeding  . TRANSTHORACIC ECHOCARDIOGRAM  09/2008   EF 60-65%, mod conc LVH  . TRANSURETHRAL RESECTION OF PROSTATE    . TRANSURETHRAL RESECTION OF PROSTATE N/A 06/18/2013   Procedure: TRANSURETHRAL RESECTION OF THE PROSTATE (TURP);  Surgeon: Marissa Nestle, MD;  Location: [facility];  Service: Urology;  Laterality: N/A;    Social History   Socioeconomic History  . Marital status: Widowed    Spouse name: Roger Kill  . Number of children: 5  . Years of education: College  . Highest education level: Not on file  Occupational History  . Occupation: disability    Employer: RETIRED  Social Needs  . Financial resource strain: Not on file  . Food insecurity:     Worry: Not on file    Inability: Not on file  . Transportation needs:    Medical: Not on file    Non-medical: Not on file  Tobacco Use  . Smoking status: Former Smoker    Years: 14.00    Types: Cigars    Start date: 11/20/1959    Last attempt to quit: 08/10/1973    Years since quitting: 44.6  . Smokeless tobacco: Never Used  Substance and Sexual Activity  . Alcohol use: No    Alcohol/week: 0.0 standard drinks    Comment: denies  . Drug use: No  . Sexual activity: Never    Partners: Male    Birth control/protection: None  Lifestyle  . Physical activity:    Days per week: Not on file    Minutes per session:  Not on file  . Stress: Not on file  Relationships  . Social connections:    Talks on phone: Not on file    Gets together: Not on file    Attends religious service: Not on file    Active member of club or organization: Not on file    Attends meetings of clubs or organizations: Not on file    Relationship status: Not on file  Other Topics Concern  . Not on file  Social History Narrative   Patient lives at home with spouse and son.   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      FAMILY HISTORY:  We obtained a detailed, 4-generation family history.  Significant diagnoses are listed below: Family History  Problem Relation Age of Onset  . Colon cancer Father 34       deceased  . Prostate cancer Father   . Pancreatic cancer Mother 1       deceased  . Prostate cancer Brother        dx 56s  . Arthritis Son   . Colon polyps Son   . Arthritis Son   . Diabetes Mellitus II Son   . Lung disease Maternal Uncle        black lung  . Diabetes Maternal Grandmother   . Diabetes Maternal Grandfather     The patient has four sons and a daughter who are all cancer free.  He has one brother and three sisters.  His brother has prostate cancer.  Both parents are deceased.  The patient's mother had pancreatic cancer at 84.  She has one brother who  died of black lung disease.  He had 13 children, who had lung cancer.  The patient's maternal grandparents are deceased from diabetes.  The patient's father had prostate and colon cancer in his 88's.  He had two brothers who were cancer free.  The paternal grandparents are deceased from old age.  [name] [name] is unaware of previous family history of genetic testing for hereditary cancer risks. Patient's ancestors are of [place], [place] descent. There is no reported Ashkenazi Jewish ancestry. There is no known consanguinity.  GENETIC COUNSELING ASSESSMENT: [name] [name]. is a 77 y.o. male with a personal history of pancreatic cancer and a family history of pancreatic cancer, prostate and colon cancer which is somewhat suggestive of a hereditary cancer syndrome and predisposition to cancer. We, therefore, discussed and recommended the following at today's visit.   DISCUSSION: We discussed that about 5-10% of pancreatic cancer is hereditary with most cases due to BRCA, Lynch or PALB2 mutations.  Based on the family history, the cancers are most consistent with Lynch syndrome.  We discussed the dominant inheritance pattern with Lynch syndrome.    We reviewed the characteristics, features and inheritance patterns of hereditary cancer syndromes. We also discussed genetic testing, including the appropriate family members to test, the process of testing, insurance coverage and turn-around-time for results. We discussed the implications of a negative, positive and/or variant of uncertain significant result. We recommended [name] [name] pursue genetic testing for the multi cancer gene panel. The Multi-Gene Panel offered by [facility] includes sequencing and/or deletion duplication testing of the following 84 genes: AIP, ALK, APC, ATM, AXIN2,BAP1,  BARD1, BLM, BMPR1A, BRCA1, BRCA2, BRIP1, CASR, CDC73, CDH1, CDK4, CDKN1B, CDKN1C, CDKN2A (p14ARF), CDKN2A (p16INK4a), CEBPA, CHEK2, CTNNA1, DICER1, DIS3L2, EGFR  (c.2369C>T, p.Thr790Met variant only), EPCAM (Deletion/duplication testing only), FH, FLCN, GATA2, GPC3, GREM1 (  Promoter region deletion/duplication testing only), HOXB13 (c.251G>A, p.Gly84Glu), HRAS, KIT, MAX, MEN1, MET, MITF (c.952G>A, p.Glu318Lys variant only), MLH1, MSH2, MSH3, MSH6, MUTYH, NBN, NF1, NF2, NTHL1, PALB2, PDGFRA, PHOX2B, PMS2, POLD1, POLE, POT1, PRKAR1A, PTCH1, PTEN, RAD50, RAD51C, RAD51D, RB1, RECQL4, RET, RUNX1, SDHAF2, SDHA (sequence changes only), SDHB, SDHC, SDHD, SMAD4, SMARCA4, SMARCB1, SMARCE1, STK11, SUFU, TERC, TERT, TMEM127, TP53, TSC1, TSC2, VHL, WRN and WT1.    Based on [name] [name]'s personal and family history of cancer, he meets medical criteria for genetic testing. Despite that he meets criteria, he may still have an out of pocket cost. We discussed that if his out of pocket cost for testing is over $100, the laboratory will call and confirm whether he wants to proceed with testing.  If the out of pocket cost of testing is less than $100 he will be billed by the genetic testing laboratory.   PLAN: After considering the risks, benefits, and limitations, [name] [name]  provided informed consent to pursue genetic testing and the blood sample was sent to [facility] for analysis of the Multi cancer gene panel. Results should be available within approximately 2-3 weeks' time, at which point they will be disclosed by telephone to [name] [name], as will any additional recommendations warranted by these results. [name] [name] will receive a summary of his genetic counseling visit and a copy of his results once available. This information will also be available in Epic. We encouraged [name] [name] to remain in contact with cancer genetics annually so that we can continuously update the family history and inform him of any changes in cancer genetics and testing that may be of benefit for his family. [name] [name] questions were answered to his satisfaction today. Our contact information  was provided should additional questions or concerns arise.  Lastly, we encouraged [name] [name] to remain in contact with cancer genetics annually so that we can continuously update the family history and inform him of any changes in cancer genetics and testing that may be of benefit for this family.   Mr.  [name] questions were answered to his satisfaction today. Our contact information was provided should additional questions or concerns arise. Thank you for the referral and allowing [name] to share in the care of your patient.   Karen P. Florene Glen, [place], [facility] Certified Genetic Counselor [email or web address] phone: [phone]  The patient was seen for a total of 40 minutes in face-to-face genetic counseling.  This patient was discussed with Drs. Magrinat, Lindi Adie and/or Burr Medico who agrees with the above.    _______________________________________________________________________ For Office Staff:  Number of people involved in session: 2 Was an Intern/ student involved with case: no

## 2018-04-06 ENCOUNTER — Encounter (HOSPITAL_COMMUNITY): Payer: Self-pay | Admitting: Lab

## 2018-04-06 DIAGNOSIS — Z86010 Personal history of colonic polyps: Secondary | ICD-10-CM | POA: Diagnosis not present

## 2018-04-06 DIAGNOSIS — Z800 Family history of malignant neoplasm of digestive organs: Secondary | ICD-10-CM | POA: Diagnosis not present

## 2018-04-06 DIAGNOSIS — C251 Malignant neoplasm of body of pancreas: Secondary | ICD-10-CM | POA: Diagnosis not present

## 2018-04-06 DIAGNOSIS — Z8042 Family history of malignant neoplasm of prostate: Secondary | ICD-10-CM | POA: Diagnosis not present

## 2018-04-06 NOTE — Progress Notes (Unsigned)
Referral sent to [facility].  Records faxed on 11/15

## 2018-04-16 ENCOUNTER — Encounter: Payer: Self-pay | Admitting: Genetic Counselor

## 2018-04-16 ENCOUNTER — Telehealth: Payer: Self-pay | Admitting: Genetic Counselor

## 2018-04-16 DIAGNOSIS — Z1379 Encounter for other screening for genetic and chromosomal anomalies: Secondary | ICD-10-CM | POA: Insufficient documentation

## 2018-04-16 NOTE — Telephone Encounter (Signed)
Spoke to son, Evette Doffing.  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  Discussed that CHEK2 is considered a Breast/colon cancer gene, but is not typically associated with pancreatic cancer.  Explained that CHEK2 is a moderate risk gene, and we recommend that family members consider getting tested.  IF they are tested within 90 days of the report date, the testinb lab will test them for free.  Therefore they have until February time frame.    Explained that there are two other gene changes with a VUS status.  We will not change medical management based on this gene change.    The son stated that he was not interested in coming back in for testing, but would pass the information on to his family.  He stated that he has two brothers who are 'locked up', and he does have a daughter, who he will pass this information on to.  He will pass it on to his sister as well.  I released a copy of the report to his son, Evette Doffing, via email.

## 2018-04-22 ENCOUNTER — Ambulatory Visit: Payer: Self-pay | Admitting: Genetic Counselor

## 2018-04-22 DIAGNOSIS — Z1379 Encounter for other screening for genetic and chromosomal anomalies: Secondary | ICD-10-CM

## 2018-04-22 NOTE — Progress Notes (Signed)
GENETIC TEST RESULTS   Patient Name: [name] [name]. Patient Age: 77 y.o. Encounter Date: 04/22/2018  Referring Provider: Zoila Shutter, MD    Mr. Boule was seen in the [place] clinic on April 05, 2018 due to a personal and family history of cancer and concern regarding a hereditary predisposition to cancer in the family. Please refer to the prior Genetics clinic note for more information regarding Mr. Pry's medical and family histories and our assessment at the time.   FAMILY HISTORY:  We obtained a detailed, 4-generation family history.  Significant diagnoses are listed below: Family History  Problem Relation Age of Onset  . Colon cancer Father 46       deceased  . Prostate cancer Father   . Pancreatic cancer Mother 26       deceased  . Prostate cancer Brother        dx 90s  . Arthritis Son   . Colon polyps Son   . Arthritis Son   . Diabetes Mellitus II Son   . Lung disease Maternal Uncle        black lung  . Diabetes Maternal Grandmother   . Diabetes Maternal Grandfather     The patient has four sons and a daughter who are all cancer free.  He has one brother and three sisters.  His brother has prostate cancer.  Both parents are deceased.  The patient's mother had pancreatic cancer at 110.  She has one brother who died of black lung disease.  He had 13 children, who had lung cancer.  The patient's maternal grandparents are deceased from diabetes.  The patient's father had prostate and colon cancer in his 10's.  He had two brothers who were cancer free.  The paternal grandparents are deceased from old age.  Mr. Whichard is unaware of previous family history of genetic testing for hereditary cancer risks. Patient's ancestors are of [place] descent. There is no reported Ashkenazi Jewish ancestry. There is no known consanguinity.  GENETIC TESTING:  At the time of Mr. Nydam's visit, we recommended he pursue genetic testing of the  multi-cancer panel test. 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 There were no deleterious mutations in AIP, ALK, APC, ATM, AXIN2,BAP1,  BARD1, BLM, BMPR1A, BRCA1, BRCA2, BRIP1, CASR, CDC73, CDH1, CDK4, CDKN1B, CDKN1C, CDKN2A (p14ARF), CDKN2A (p16INK4a), CEBPA, CTNNA1, DICER1, DIS3L2, EGFR (c.2369C>T, p.Thr790Met variant only), EPCAM (Deletion/duplication testing only), FH, FLCN, GATA2, GPC3, GREM1 (Promoter region deletion/duplication testing only), HOXB13 (c.251G>A, p.Gly84Glu), HRAS, KIT, MAX, MEN1, MET, MITF (c.952G>A, p.Glu318Lys variant only), MLH1, MSH2, MSH3, MSH6, MUTYH, NBN, NF1, NF2, NTHL1, PALB2, PDGFRA, PHOX2B, PMS2, POLD1, POLE, POT1, PRKAR1A, PTCH1, PTEN, RAD50, RAD51C, RAD51D, RB1, RECQL4, RET, RUNX1, SDHAF2, SDHA (sequence changes only), SDHB, SDHC, SDHD, SMAD4, SMARCA4, SMARCB1, SMARCE1, STK11, SUFU, TERC, TERT, TMEM127, TP53, TSC1, TSC2, VHL, WRN and WT1.   Genetic testing did detect two Variants of Unknown Significance - one in the POLD1 gene called c.1295C>A and a second in VHL called c.28G>A.  At this time, it is unknown if these variants are associated with increased cancer risk or if this they are a normal finding, but most variants such as these get reclassified to being inconsequential. They should not be used to make medical management decisions. With time, we suspect the lab will determine the significance of these variants, if any. If we do learn more about them,  we will try to contact Mr. Gignac to discuss it further. However, it is important to stay in touch with [name] periodically and keep the address and phone number up to date.   DISCUSSION: The testing was reported out to Mr. Wither's son Evette Doffing.  We discussed that CHEK2 mutations have been found to be associated with an increased risk of breast and other cancers, but that pancreatic  cancer is not a hallmark cancer for this gene. The estimated cancer risks vary widely and may be influenced by family history. Women with a CHEK2 deleterious mutation have approximately a 24% (no family history of breast cancer) to 48% (strong family history of breast cancer) lifetime risk of breast cancer and up to a 25% risk of a second breast cancer. Men may have an increased risk for male breast cancer of about 1%. Men and women may have an increased risk of colon cancer (~10-14% lifetime risk). According to the NCCN guidelines, individuals with CHEK2 mutations should consider breast MRI's as a part of regular breast cancer screening, and depending on family history could consider a risk-reducing mastectomy.  Breast cancer screening should begin, for women, at age 55 or 83 years younger than the earliest age of onset.  Colon cancer screening should begin at age 47 and continue every 5 years or based on polyp number.    An individual's cancer risk and medical management are not determined by genetic test results alone. Overall cancer risk assessment incorporates additional factors, including personal medical history, family history, and any available genetic information that may result in a personalized plan for cancer prevention and surveillance.   CANCER SCREENING: Below are the NCCN Practice guidelines for women and men.  However, because the breast cancer risks for women and prostate cancer risks for men may be similar, it is appropriate to consider these high risk management recommendations.  Breast Management Options We reviewed the NCCN practice guidelines (v3.2019) for breast management for women at an increased risk of breast cancer because of CHEK2 mutations:   1. Breast awareness (which may include periodic, consistent breast self exam) starting at age 30.  2. Breast screening:  . Starting at age 14, annual mammogram and breast MRI screening, or starting 10 years younger than the earliest  age of onset.  . Evidence of risk-reducing mastectomy is insufficient at this time.  [place] based on family history.  Colon Cancer Management:  Men and women with a deleterious CHEK2 mutation may have up to a 10-14% lifetime risk for colon cancer. The following is recommended for individuals with a CHEK2 mutation:  Personal history of colon cancer  Follow instructions provided by your physician based on your personal history.  Do not have a personal history of colon cancer but have a parent/sibling/child with colon cancer: Colonoscopy every 5 years starting at age 49 or 29 years younger than the earliest age of onset, whichever is younger.  Do not have a personal history of colon cancer and do not have a parent/sibling/child with colon cancer: Colonoscopy every 5 years starting at age 9.  Prostate Cancer Management: It has been suggested that men with a CHEK2 pathogenic variant and a first-degree relative with prostate cancer have an annual prostate-specific antigen (PSA) analysis (PMID: 97353299). However, the benefits of screening for prostate cancer among men with a pathogenic variant in CHEK2 are uncertain (PMID: 24268341).  FAMILY MEMBERS: It is important that all of Mr. Vondrak's relatives (both men and women) know of the presence of this gene  mutation.  Women need to know that they may be at increased risk for breast and colon cancers.  Men are at slightly increased risk for breast, prostate and colon cancers.  Genetic testing can sort out who in your family is at risk and who is not.  We would be happy to help meet with and coordinate genetic testing for any relative that is interested.  Mr. Metoyer children and siblings are at 50% risk to have inherited the mutation found in him. We recommend they have genetic testing for this same mutation, as identifying the presence of this mutation would allow them to also take advantage of risk-reducing measures.   Karlene Einstein, Mr. Giraldo'  son, was not interested in coming back in for genetic counseling or for testing of himself.  He states that he will pass this information on to his children, as well as his sister, so that they can undergo testing if interested.  He understands that any family testing can be performed for free through [facility], if testing is completed within 90 days of the report date.  Individuals with two copies of the CHEK2 1100delC variant (i.e., those who are homozygous) have a higher breast cancer risk compared with those with a single copy (i.e., those who are heterozygous). The lifetime risk of breast cancer in individuals homozygous for this variant is estimated to be increased four to six fold. The estimates of other cancer risks among homozygotes or compound heterozygotes involving other variants is unclear.  Our knowledge of cancer risks related to CHEK2 mutations will continue to evolve. We recommended that Mr. Bluitt follow up with the genetics clinic annually so we can provide him with the most current information about CHEK2 and cancer risk, as well as with any changes to his family history (new cancer diagnoses, genetic test results).  Our contact number was provided. Mr. Alspaugh' son, Evette Doffing and all of his questions were answered to his satisfaction, and he knows he is welcome to call us at anytime with additional questions or concerns.   Roma Kayser, MS, [facility] Certified Genetic Counselor [email or web address]

## 2018-04-25 DIAGNOSIS — L03031 Cellulitis of right toe: Secondary | ICD-10-CM | POA: Diagnosis not present

## 2018-04-25 DIAGNOSIS — M79674 Pain in right toe(s): Secondary | ICD-10-CM | POA: Diagnosis not present

## 2018-04-25 DIAGNOSIS — M79671 Pain in right foot: Secondary | ICD-10-CM | POA: Diagnosis not present

## 2018-04-25 DIAGNOSIS — L600 Ingrowing nail: Secondary | ICD-10-CM | POA: Diagnosis not present

## 2018-04-27 ENCOUNTER — Ambulatory Visit (INDEPENDENT_AMBULATORY_CARE_PROVIDER_SITE_OTHER): Payer: Medicare Other | Admitting: Cardiology

## 2018-04-27 ENCOUNTER — Encounter: Payer: Self-pay | Admitting: Cardiology

## 2018-04-27 ENCOUNTER — Ambulatory Visit (HOSPITAL_COMMUNITY): Payer: Medicare HMO | Admitting: Internal Medicine

## 2018-04-27 VITALS — BP 130/78 | HR 69 | Ht 68.0 in | Wt 268.0 lb

## 2018-04-27 DIAGNOSIS — I5032 Chronic diastolic (congestive) heart failure: Secondary | ICD-10-CM | POA: Diagnosis not present

## 2018-04-27 DIAGNOSIS — I10 Essential (primary) hypertension: Secondary | ICD-10-CM | POA: Diagnosis not present

## 2018-04-27 NOTE — Progress Notes (Signed)
Clinical Summary [name] [name] is a 77 y.o.male seen today for follow up of the following medical problems.   1. Chronic diastolic heart failure  - No recent edema.  - off diuretics due to prior issues with hypotension. Since being off has not had recurrence of edema.   - no recent edema. Home weights around 265-268 lbs.    2.HTN - home bp's stable 130s/80s   3. CVA - noted during recent admit 08/2016 by imaging - he is on plavix for secondary prevention (ASA allergy)   4. Pancreatic adenocarcinoma - followed by oncology, fairly recent diagnosis. From patient report told inoperable, little benefit with chemo. Currently under comfort care - undergoing home hospice. Past Medical History:  Diagnosis Date  . Assistance needed for mobility    cane, walker  . Bilateral lower leg cellulitis   . BPH (benign prostatic hyperplasia)   . Cataract    left  . Chronic diastolic HF (heart failure) ([place])   . Chronic low back pain   . Collagen vascular disease ([place])    history of venous insufficency and LE cellulitis  . Complication of anesthesia    2012 due to sleep apnea pt has had difficulty being put under  and waking up from anesthesia  . Deep venous insufficiency   . Family history of colon cancer   . Family history of pancreatic cancer   . Family history of prostate cancer   . GERD (gastroesophageal reflux disease)   . Glaucoma    left  . Headache   . Heel spur    right heel  . History of bladder infections   . History of colonic polyps   . Hyperlipidemia   . Hypertension    Sees Dr. Debara Pickett  . Hypothyroidism    hypothyroid denies no rx  . Iron deficiency 07/07/2016  . Kidney stones    passed them  . Obesity   . Osteoarthritis   . Pancreatic cancer ([place])   . PONV (postoperative nausea and vomiting)   . PUD (peptic ulcer disease)   . Ringing in ears   . S/P colonoscopy 2007, Feb and March 2012   hx of adenomas, left-sided diverticula, On 07/2010 TCS, fresh  blood and clot noted coming from TI.  . S/P endoscopy 2007, 2012   2007: nl, May 2012: antral erosions  . SIRS (systemic inflammatory response syndrome) ([place]) 02/27/2012  . Sleep apnea    MARJORITY OF TIME DOES NOT USE CPAP PER SON   . Thrombocytopenia ([place]) 12/30/2011   Stable     Allergies  Allergen Reactions  . Aspirin Other (See Comments)    Nausea and upset stomach    . Iodinated Diagnostic Agents Hives and Other (See Comments)    After CT 12/20/17  . Nexium [Esomeprazole Magnesium] Other (See Comments)    Patient said it messed his stomach up     Current Outpatient Medications  Medication Sig Dispense Refill  . acetaminophen (TYLENOL) 325 MG tablet Take 2 tablets (650 mg total) by mouth every 6 (six) hours as needed for mild pain (or Fever >/= 101).    . brimonidine (ALPHAGAN) 0.2 % ophthalmic solution Place 1 drop into the right eye 2 (two) times daily.     . Calcium Citrate-Vitamin D (CALCIUM + D PO) Take 1 tablet by mouth daily.    . Cholecalciferol (VITAMIN D3) 1000 units CAPS Take 1,000 Units by mouth daily.     . clopidogrel (PLAVIX) 75 MG tablet  Take 1 tablet (75 mg total) by mouth daily. 90 tablet 0  . cyclobenzaprine (FLEXERIL) 10 MG tablet Take 10 mg by mouth 2 (two) times daily as needed for muscle spasms.    . diclofenac sodium (VOLTAREN) 1 % GEL Apply 4 g 4 (four) times daily as needed topically. (Patient taking differently: Apply 4 g topically at bedtime. ) 2 Tube 3  . docusate sodium (COLACE) 100 MG capsule Take 100 mg by mouth 2 (two) times daily as needed for mild constipation.    . dorzolamide-timolol (COSOPT) 22.3-6.8 MG/ML ophthalmic solution Place 1 drop into both eyes 2 (two) times daily.    . ferrous sulfate 325 (65 FE) MG tablet Take 325 mg by mouth daily with breakfast.    . Ginger, Zingiber officinalis, (GINGER ROOT) 550 MG CAPS Take 550 mg by mouth 2 (two) times daily.    . metoprolol tartrate (LOPRESSOR) 25 MG tablet Take 25 mg by mouth daily.     Marland Kitchen  oxyCODONE-acetaminophen (PERCOCET) 10-325 MG tablet Take 1 tablet by mouth 3 (three) times daily.   0  . pantoprazole (PROTONIX) 40 MG tablet Take 1 tablet (40 mg total) by mouth 2 (two) times daily. 180 tablet 3  . Polyethyl Glycol-Propyl Glycol (LUBRICANT EYE DROPS) 0.4-0.3 % SOLN Place 1 drop into both eyes daily as needed (for lubricant eye drops).     . potassium chloride (KLOR-CON) 20 MEQ packet Take 40 mEq by mouth 2 (two) times daily.     . TURMERIC PO Take 1 tablet by mouth daily.      No current facility-administered medications for this visit.      Past Surgical History:  Procedure Laterality Date  . BACK SURGERY    . BACK SURGERY  12/12/2017  . CARDIAC CATHETERIZATION  02/27/2007   no sign of CAD, LVH, mod pulm HTN (Dr. Jackie Plum(  . CATARACT EXTRACTION W/ INTRAOCULAR LENS IMPLANT Right ~ 2012  . COLONOSCOPY N/A 05/28/2014   HDQ:[id number] colonic polyps/colonic diverticulosis. Tubular adenomas. Next colonoscopy January 2021  . ESOPHAGOGASTRODUODENOSCOPY  Aug 2013   [facility], single 31mm pedunculated polyp otherwise normal. HYPERPLASTIC, NEGATIVE h.pylori  . ESOPHAGOGASTRODUODENOSCOPY N/A 05/28/2014   RMR:non-critical Schatzki's ring/HH. Benign gastritis  . ESOPHAGOGASTRODUODENOSCOPY N/A 03/22/2018   Procedure: ESOPHAGOGASTRODUODENOSCOPY (EGD);  Surgeon: Milus Banister, MD;  Location: [facility];  Service: Endoscopy;  Laterality: N/A;  . ESOPHAGOGASTRODUODENOSCOPY (EGD) WITH PROPOFOL N/A 01/18/2018   Procedure: ESOPHAGOGASTRODUODENOSCOPY (EGD) WITH PROPOFOL;  Surgeon: Daneil Dolin, MD;  [facility];  Service: Endoscopy;  Laterality: N/A;  1:45pm  . EUS N/A 03/22/2018   Procedure: UPPER ENDOSCOPIC ULTRASOUND (EUS) RADIAL;  Surgeon: Milus Banister, MD;  Location: [facility];  Service: Endoscopy;  Laterality: N/A;  . FINE NEEDLE ASPIRATION N/A 03/22/2018   Procedure: FINE NEEDLE ASPIRATION (FNA) LINEAR;  Surgeon: Milus Banister, MD;  Location: [facility];  Service:  Endoscopy;  Laterality: N/A;  . INGUINAL HERNIA REPAIR Right 1941  . KNEE ARTHROSCOPY Left ~ 2000  . LAPAROSCOPIC CHOLECYSTECTOMY    . [place] SURGERY  2012  . MALONEY DILATION N/A 05/28/2014   Procedure: Venia Minks DILATION;  Surgeon: Daneil Dolin, MD;  [facility];  Service: Endoscopy;  Laterality: N/A;  . NASAL SEPTOPLASTY W/ TURBINOPLASTY Bilateral 10/07/2015   Procedure: NASAL SEPTOPLASTY WITH TURBINATE REDUCTION;  Surgeon: Leta Baptist, MD;  Location: [facility];  Service: ENT;  Laterality: Bilateral;  . NASAL SEPTUM SURGERY Bilateral 10/07/2015    inferior turbinate resection  .  NM MYOCAR PERF WALL MOTION  12/2006   dipyridamole myoview - stress images show mild perfusion defect in mid inferior walls with reversibility at rest; mild perfusion defect in mid inferolateral wall at stress with mild defect reversibility, EF 62%, abnormal but low risk study   . POSTERIOR LUMBAR FUSION  2015  . SAVORY DILATION N/A 05/28/2014   Procedure: SAVORY DILATION;  Surgeon: Daneil Dolin, MD;  [facility];  Service: Endoscopy;  Laterality: N/A;  . SHOULDER OPEN ROTATOR CUFF REPAIR Left early 2000s  . small bowel capsule study  07/2011   ?transient focal ischemia in distal small bowel to explain GI bleeding  . TRANSTHORACIC ECHOCARDIOGRAM  09/2008   EF 60-65%, mod conc LVH  . TRANSURETHRAL RESECTION OF PROSTATE    . TRANSURETHRAL RESECTION OF PROSTATE N/A 06/18/2013   Procedure: TRANSURETHRAL RESECTION OF THE PROSTATE (TURP);  Surgeon: Marissa Nestle, MD;  Location: [facility];  Service: Urology;  Laterality: N/A;     Allergies  Allergen Reactions  . Aspirin Other (See Comments)    Nausea and upset stomach    . Iodinated Diagnostic Agents Hives and Other (See Comments)    After CT 12/20/17  . Nexium [Esomeprazole Magnesium] Other (See Comments)    Patient said it messed his stomach up      Family History  Problem Relation Age of Onset  . Colon cancer Father 75       deceased    . Prostate cancer Father   . Pancreatic cancer Mother 68       deceased  . Prostate cancer Brother        dx 70s  . Arthritis Son   . Colon polyps Son   . Arthritis Son   . Diabetes Mellitus II Son   . Lung disease Maternal Uncle        black lung  . Diabetes Maternal Grandmother   . Diabetes Maternal Grandfather      Social History [name] [name] reports that he quit smoking about 44 years ago. His smoking use included cigars. He started smoking about 58 years ago. He quit after 14.00 years of use. He has never used smokeless tobacco. [name] [name] reports that he does not drink alcohol.   Review of Systems CONSTITUTIONAL: No weight loss, fever, chills, weakness or fatigue.  HEENT: Eyes: No visual loss, blurred vision, double vision or yellow sclerae.No hearing loss, sneezing, congestion, runny nose or sore throat.  SKIN: No rash or itching.  CARDIOVASCULAR: no chest pain, no palpitations.  RESPIRATORY: No shortness of breath, cough or sputum.  GASTROINTESTINAL: No anorexia, nausea, vomiting or diarrhea. No abdominal pain or blood.  GENITOURINARY: No burning on urination, no polyuria NEUROLOGICAL: No headache, dizziness, syncope, paralysis, ataxia, numbness or tingling in the extremities. No change in bowel or bladder control.  MUSCULOSKELETAL: No muscle, back pain, joint pain or stiffness.  LYMPHATICS: No enlarged nodes. No history of splenectomy.  PSYCHIATRIC: No history of depression or anxiety.  ENDOCRINOLOGIC: No reports of sweating, cold or heat intolerance. No polyuria or polydipsia.  Marland Kitchen   Physical Examination Vitals:   04/27/18 1049  BP: 130/78  Pulse: 69  SpO2: 95%   Vitals:   04/27/18 1049  Weight: 268 lb (121.6 kg)  Height: 5\' 8"  (1.727 m)    Gen: resting comfortably, no acute distress HEENT: no scleral icterus, pupils equal round and reactive, no palptable cervical adenopathy,  CV: RRR, no m/r/g, no jvd Resp: Clear to auscultation bilaterally GI:  abdomen  is soft, non-tender, non-distended, normal bowel sounds, no hepatosplenomegaly MSK: extremities are warm, trace biltaeral edema Skin: warm, no rash Neuro:  no focal deficits Psych: appropriate affect   Diagnostic Studies     Assessment and Plan  1. Chronic diastolic HF -stable weights, no severe fluid overload by exam. Has actually done well recently off diuretics, had issues with hypotension previously on diuretics - continue to monitor.   2. HTN - at goal. We had discontinued lopressor previously, will make sure its stopped as it still appears on his med list.   3.Pancreatic cancer - patient under home hospice care   F/u as needed   Arnoldo Lenis, M.D.,

## 2018-04-27 NOTE — Patient Instructions (Signed)
Your physician recommends that you schedule a follow-up appointment in: AS NEEDED WITH DR Glide has recommended you make the following change in your medication:   STOP METOPROLOL   Thank you for choosing [facility]!!

## 2018-05-21 ENCOUNTER — Telehealth: Payer: Self-pay | Admitting: Internal Medicine

## 2018-05-21 NOTE — Telephone Encounter (Signed)
Pt is deceased. I have mailed a sympathy card to his family.

## 2018-05-21 NOTE — Telephone Encounter (Signed)
That is so sad, I hate to hear that.

## 2018-05-21 NOTE — Telephone Encounter (Signed)
Noted  

## 2018-05-21 NOTE — Telephone Encounter (Signed)
Noted! So sorry! Thanks Manuela Schwartz.

## 2018-05-23 NOTE — Telephone Encounter (Signed)
noted 

## 2018-05-23 DEATH — deceased

## 2018-09-26 ENCOUNTER — Ambulatory Visit: Payer: Medicare HMO | Admitting: Nurse Practitioner

## 2018-10-09 IMAGING — CT CT ABD-PELV W/ CM
2 of 6 series · 16 of 46 positions shown, 18 images · IV contrast (iopamidol)
Comparison: 03/22/2016

CLINICAL DATA: 76-year-old with hypotension, nausea, dizziness and
body aches. Back pain.

EXAM:
CT ABDOMEN AND PELVIS WITH CONTRAST
TECHNIQUE: Multidetector CT imaging of the abdomen and pelvis was performed
using the standard protocol following bolus administration of
intravenous contrast.
CONTRAST:  100mL [id number] IOPAMIDOL ([id number]) INJECTION 61%

[Series 2: axial st · axial · 0.96mm/px · z∈[+827,+1297]mm · 13 of 108 slices shown, 15 images]
[im 7/108  soft-tissue]
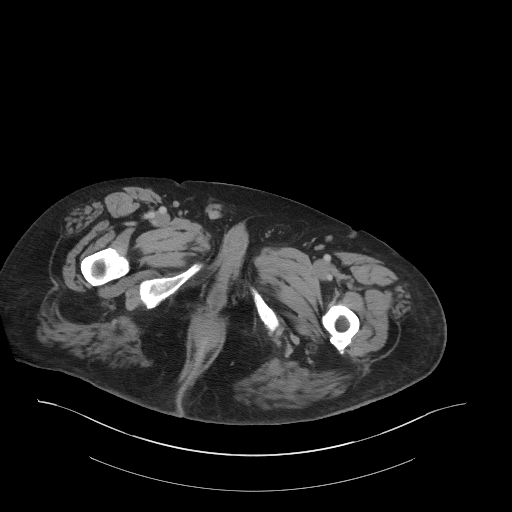
[im 7/108  bone]
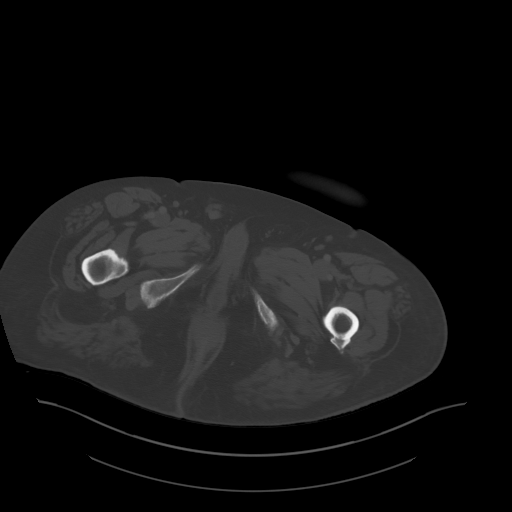
[im 14/108  soft-tissue]
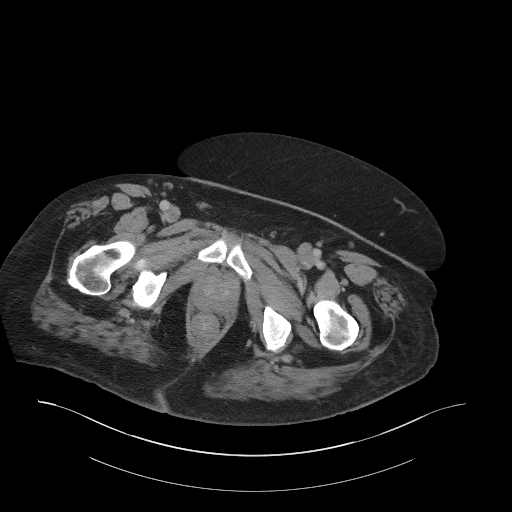
[im 21/108  soft-tissue]
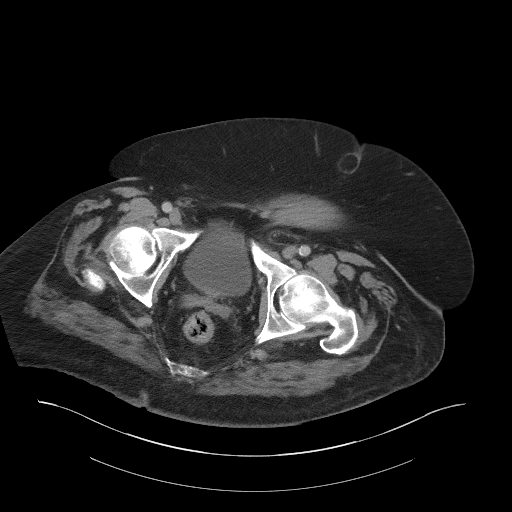
[im 34/108  soft-tissue]
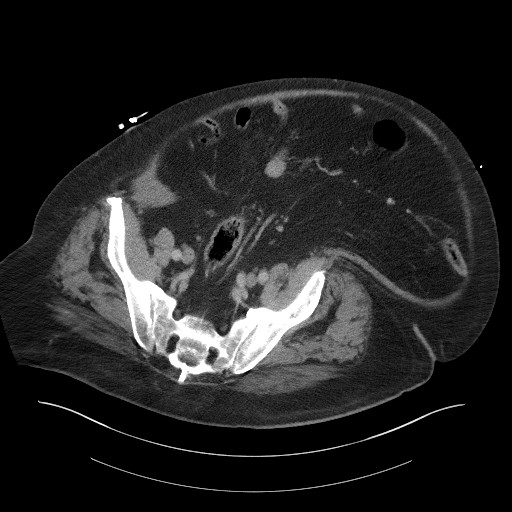
[im 41/108  soft-tissue]
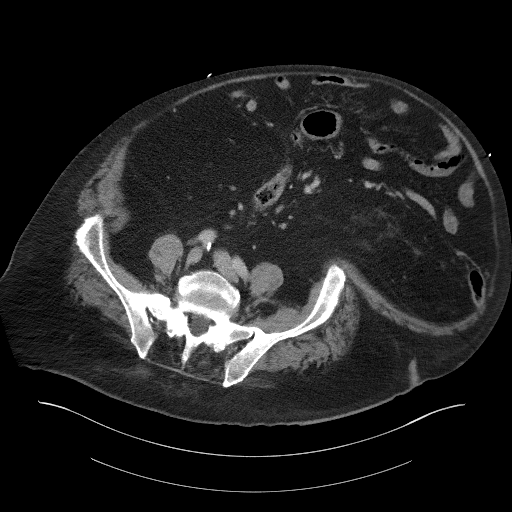
[im 47/108  soft-tissue]
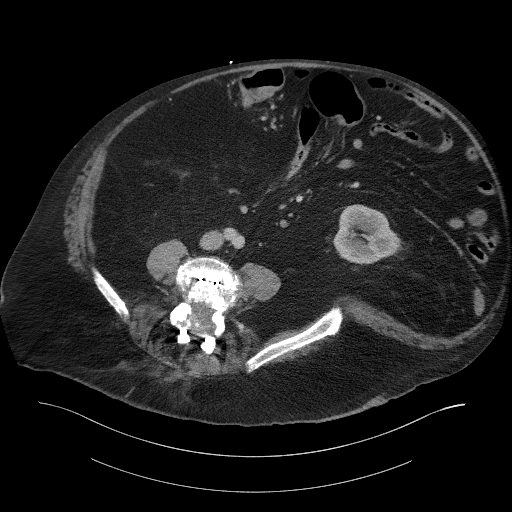
[im 54/108  soft-tissue]
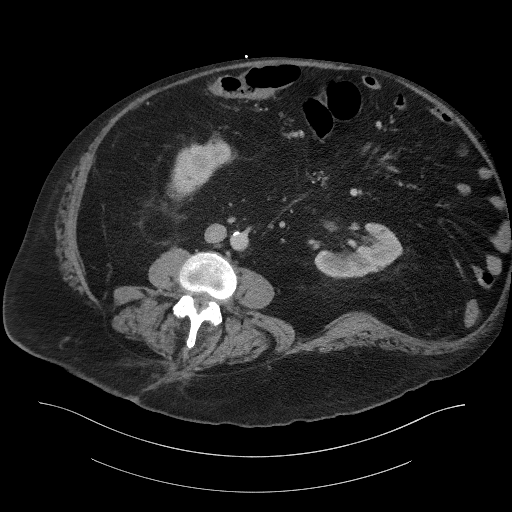
[im 61/108  soft-tissue]
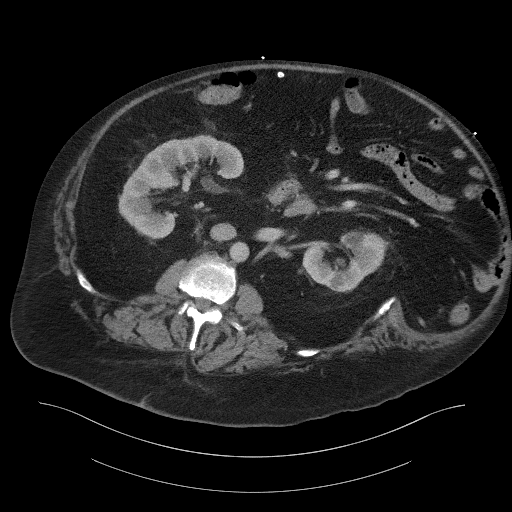
[im 67/108  soft-tissue]
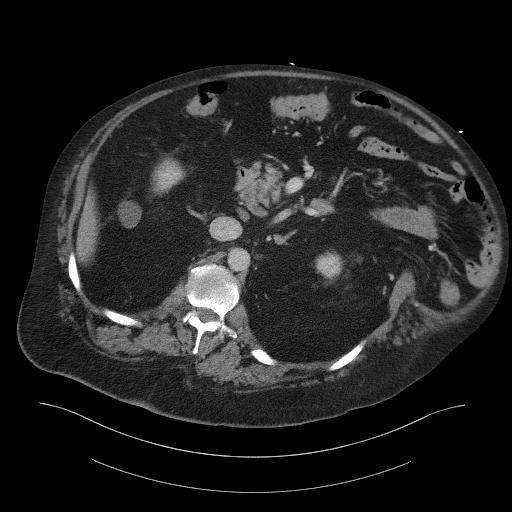
[im 67/108  bone]
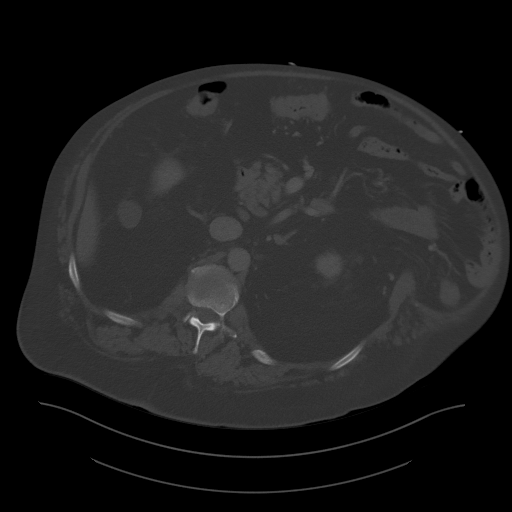
[im 74/108  soft-tissue]
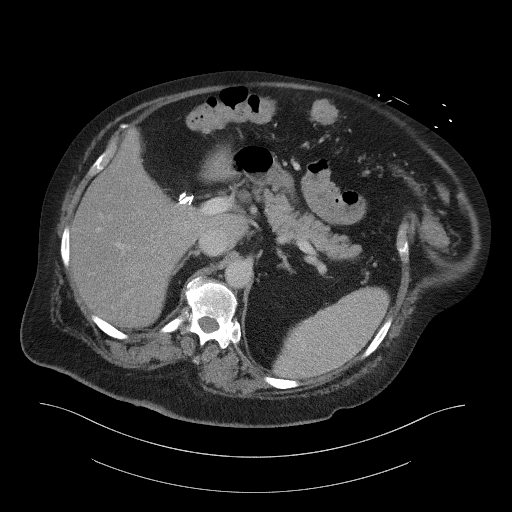
[im 87/108  soft-tissue]
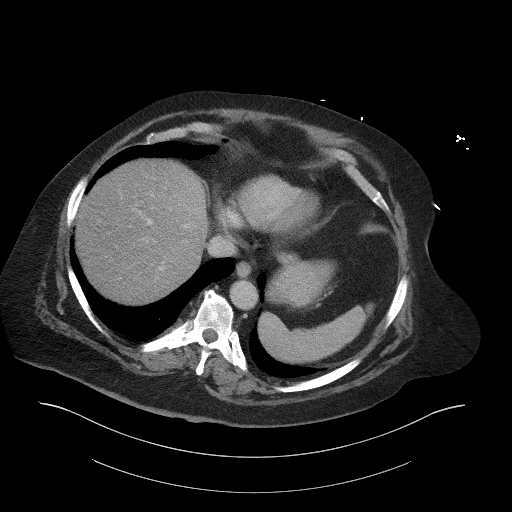
[im 94/108  soft-tissue]
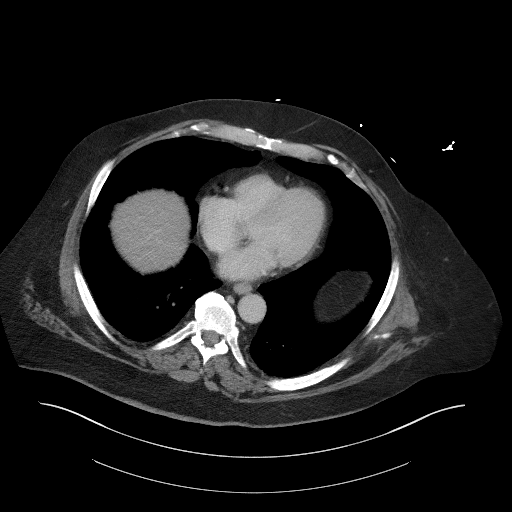
[im 101/108  soft-tissue]
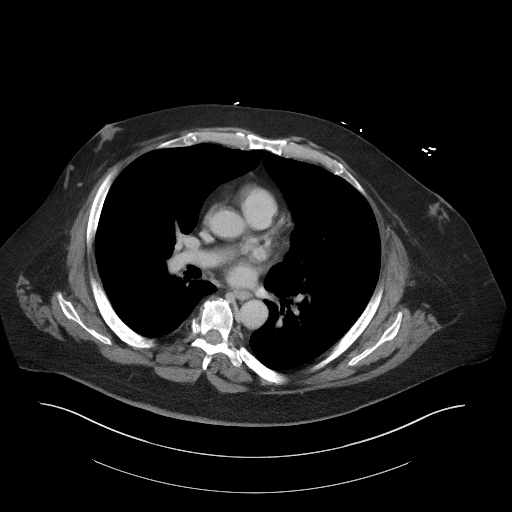

[Series 6: coronal st · coronal · 1.04mm/px · 3 of 129 slices shown]
[im 43/129  soft-tissue]
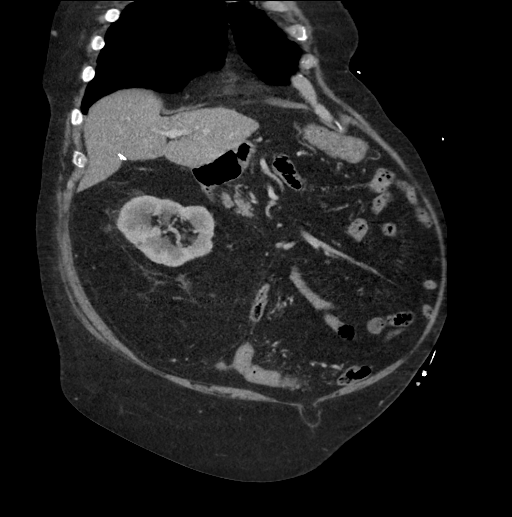
[im 57/129  soft-tissue]
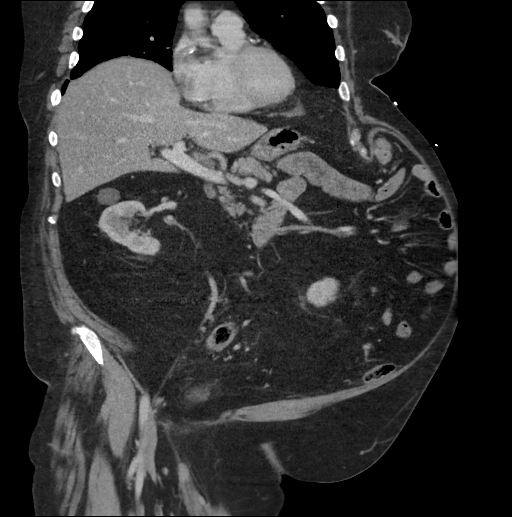
[im 72/129  soft-tissue]
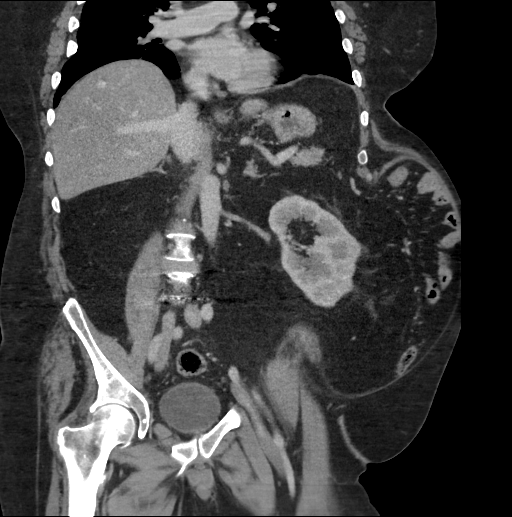

[16 of 46 positions shown; findings below may reference images not displayed]

FINDINGS: Lower chest: Lung bases are clear except for dependent atelectasis
or mild scarring. No large pleural effusions.

Hepatobiliary: Decreased attenuation of the liver is compatible with
hepatic steatosis. Again noted is a partially enhancing lesion at
the hepatic dome measuring up to 1.6 cm. There is also an area in
the right hepatic lobe on sequence 2, image 30 which is slightly
hyperdense. These areas were present on the previous examination and
previously more conspicuous. Findings could represent cavernous
hemangiomas or transient hepatic attenuation differences.
Gallbladder has been removed. Portal venous system is patent.

Pancreas: Normal appearance of the pancreas without inflammation or
duct dilatation.

Spleen: Normal appearance of spleen without enlargement.

Adrenals/Urinary Tract: Normal adrenal glands. Multiple renal cysts
without hydronephrosis. Urinary bladder is unremarkable.

Stomach/Bowel: Stomach is within normal limits. Appendix appears
normal. No evidence of bowel wall thickening, distention, or
inflammatory changes.

Vascular/Lymphatic: Small amount of calcified plaque in the arterial
structures without aneurysm. No significant abdominal or pelvic
lymphadenopathy.

Reproductive: Few calcifications in the prostate.

Other: Low-density material in the presacral space. This soft tissue
measures 1.8 x 2.5 cm on sequence 2, image 86. There was soft tissue
in this area dating back to 06/09/2013 in 6851 and this area
measured 1.0 x 1.9 cm. No significant soft tissue in this area on
02/27/2012. Again noted is a large amount of intra-abdominal fat
with a pannus formation. Left inguinal hernia containing fat.

Musculoskeletal: Pedicle screw and rod fixation with interbody
device at L5-S1. No acute bone abnormality.
IMPRESSION: Slowly enlarging soft tissue in the presacral space. Findings are
nonspecific but could be associated with extramedullary
hematopoiesis. An indolent neoplastic process cannot be completely
excluded. This structure is very low density and there could be a
fluid or fat component. Consider further characterization with
pelvic MRI.

No acute abnormality within the abdomen or pelvis.

Again noted are indeterminate enhancing or dense liver lesions.
Suspect the lesion near the dome is a a cavernous hemangioma but the
other lesion is indeterminate. These lesions are less conspicuous
compared to the exam from 0983. These could be more definitively
characterized with MRI.

Bilateral renal cysts.
